# Patient Record
Sex: Female | Born: 1935 | Race: Black or African American | Hispanic: No | Smoking: Never smoker
Health system: Southern US, Community
[De-identification: ages and names within clinical notes are randomized; demographics above are authoritative.]

## PROBLEM LIST (undated history)

## (undated) DIAGNOSIS — I209 Angina pectoris, unspecified: Secondary | ICD-10-CM

## (undated) DIAGNOSIS — E119 Type 2 diabetes mellitus without complications: Secondary | ICD-10-CM

## (undated) DIAGNOSIS — M1990 Unspecified osteoarthritis, unspecified site: Secondary | ICD-10-CM

## (undated) DIAGNOSIS — I219 Acute myocardial infarction, unspecified: Secondary | ICD-10-CM

## (undated) DIAGNOSIS — R627 Adult failure to thrive: Secondary | ICD-10-CM

## (undated) DIAGNOSIS — N183 Chronic kidney disease, stage 3 unspecified: Secondary | ICD-10-CM

## (undated) DIAGNOSIS — D571 Sickle-cell disease without crisis: Secondary | ICD-10-CM

## (undated) DIAGNOSIS — H9190 Unspecified hearing loss, unspecified ear: Secondary | ICD-10-CM

## (undated) DIAGNOSIS — R609 Edema, unspecified: Secondary | ICD-10-CM

## (undated) DIAGNOSIS — Z853 Personal history of malignant neoplasm of breast: Secondary | ICD-10-CM

## (undated) DIAGNOSIS — D649 Anemia, unspecified: Secondary | ICD-10-CM

## (undated) DIAGNOSIS — R002 Palpitations: Secondary | ICD-10-CM

## (undated) DIAGNOSIS — E785 Hyperlipidemia, unspecified: Secondary | ICD-10-CM

## (undated) DIAGNOSIS — I10 Essential (primary) hypertension: Secondary | ICD-10-CM

## (undated) DIAGNOSIS — I739 Peripheral vascular disease, unspecified: Secondary | ICD-10-CM

## (undated) DIAGNOSIS — C801 Malignant (primary) neoplasm, unspecified: Secondary | ICD-10-CM

## (undated) DIAGNOSIS — I2510 Atherosclerotic heart disease of native coronary artery without angina pectoris: Secondary | ICD-10-CM

## (undated) DIAGNOSIS — I5189 Other ill-defined heart diseases: Secondary | ICD-10-CM

## (undated) DIAGNOSIS — K219 Gastro-esophageal reflux disease without esophagitis: Secondary | ICD-10-CM

## (undated) HISTORY — PX: CORONARY ARTERY BYPASS GRAFT: SHX141

## (undated) HISTORY — DX: Personal history of malignant neoplasm of breast: Z85.3

## (undated) HISTORY — DX: Chronic kidney disease, stage 3 unspecified: N18.30

## (undated) HISTORY — PX: MASTECTOMY: SHX3

## (undated) HISTORY — DX: Chronic kidney disease, stage 3 (moderate): N18.3

## (undated) HISTORY — PX: BLADDER SURGERY: SHX569

## (undated) HISTORY — DX: Hyperlipidemia, unspecified: E78.5

## (undated) HISTORY — DX: Other ill-defined heart diseases: I51.89

## (undated) HISTORY — PX: TUMOR REMOVAL: SHX12

## (undated) HISTORY — DX: Adult failure to thrive: R62.7

## (undated) HISTORY — PX: ABDOMINAL HYSTERECTOMY: SHX81

## (undated) HISTORY — PX: CORONARY ANGIOPLASTY: SHX604

## (undated) HISTORY — DX: Peripheral vascular disease, unspecified: I73.9

## (undated) HISTORY — DX: Gastro-esophageal reflux disease without esophagitis: K21.9

---
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
## 2004-11-11 ENCOUNTER — Inpatient Hospital Stay: Payer: Self-pay | Admitting: Internal Medicine

## 2004-11-11 ENCOUNTER — Other Ambulatory Visit: Payer: Self-pay

## 2004-11-12 ENCOUNTER — Other Ambulatory Visit: Payer: Self-pay

## 2004-11-13 ENCOUNTER — Other Ambulatory Visit: Payer: Self-pay

## 2004-11-14 ENCOUNTER — Other Ambulatory Visit: Payer: Self-pay

## 2007-10-15 ENCOUNTER — Other Ambulatory Visit: Payer: Self-pay

## 2007-10-15 ENCOUNTER — Emergency Department: Payer: Self-pay | Admitting: Emergency Medicine

## 2008-04-20 ENCOUNTER — Ambulatory Visit: Payer: Self-pay | Admitting: Gastroenterology

## 2008-12-01 ENCOUNTER — Ambulatory Visit: Payer: Self-pay | Admitting: General Surgery

## 2008-12-08 ENCOUNTER — Ambulatory Visit: Payer: Self-pay | Admitting: General Surgery

## 2008-12-15 ENCOUNTER — Ambulatory Visit: Payer: Self-pay | Admitting: Oncology

## 2008-12-22 ENCOUNTER — Ambulatory Visit: Payer: Self-pay | Admitting: Oncology

## 2009-01-15 ENCOUNTER — Ambulatory Visit: Payer: Self-pay | Admitting: Oncology

## 2010-07-27 ENCOUNTER — Emergency Department: Payer: Self-pay | Admitting: Unknown Physician Specialty

## 2010-08-11 ENCOUNTER — Emergency Department: Payer: Self-pay | Admitting: Emergency Medicine

## 2011-06-18 HISTORY — PX: ABOVE KNEE LEG AMPUTATION: SUR20

## 2012-02-15 ENCOUNTER — Emergency Department: Payer: Self-pay | Admitting: Emergency Medicine

## 2013-01-07 DIAGNOSIS — I240 Acute coronary thrombosis not resulting in myocardial infarction: Secondary | ICD-10-CM | POA: Insufficient documentation

## 2013-01-07 DIAGNOSIS — I259 Chronic ischemic heart disease, unspecified: Secondary | ICD-10-CM | POA: Insufficient documentation

## 2014-08-10 DIAGNOSIS — R1013 Epigastric pain: Secondary | ICD-10-CM | POA: Insufficient documentation

## 2014-10-27 ENCOUNTER — Other Ambulatory Visit: Payer: Self-pay | Admitting: Gastroenterology

## 2014-10-27 DIAGNOSIS — R634 Abnormal weight loss: Secondary | ICD-10-CM

## 2014-11-04 ENCOUNTER — Ambulatory Visit
Admission: RE | Admit: 2014-11-04 | Discharge: 2014-11-04 | Disposition: A | Discharge status: Home / Self Care | Payer: Medicare Other | Source: Ambulatory Visit | Attending: Gastroenterology | Admitting: Gastroenterology

## 2014-11-04 DIAGNOSIS — R634 Abnormal weight loss: Secondary | ICD-10-CM | POA: Insufficient documentation

## 2014-11-04 DIAGNOSIS — I700 Atherosclerosis of aorta: Secondary | ICD-10-CM | POA: Insufficient documentation

## 2014-11-04 HISTORY — DX: Essential (primary) hypertension: I10

## 2014-11-04 HISTORY — DX: Malignant (primary) neoplasm, unspecified: C80.1

## 2014-11-04 HISTORY — DX: Type 2 diabetes mellitus without complications: E11.9

## 2014-11-04 MED ORDER — IOHEXOL 300 MG/ML  SOLN
100.0000 mL | Freq: Once | INTRAMUSCULAR | Status: AC | PRN
  Administered 2014-11-04: 100 mL via INTRAVENOUS

## 2015-02-25 ENCOUNTER — Emergency Department
Admission: EM | Admit: 2015-02-25 | Discharge: 2015-02-25 | Disposition: A | Discharge status: Home / Self Care | Payer: Medicare Other | Attending: Emergency Medicine | Admitting: Emergency Medicine

## 2015-02-25 DIAGNOSIS — Z880 Allergy status to penicillin: Secondary | ICD-10-CM | POA: Insufficient documentation

## 2015-02-25 DIAGNOSIS — I10 Essential (primary) hypertension: Secondary | ICD-10-CM | POA: Diagnosis not present

## 2015-02-25 DIAGNOSIS — E119 Type 2 diabetes mellitus without complications: Secondary | ICD-10-CM | POA: Diagnosis not present

## 2015-02-25 DIAGNOSIS — K2100 Gastro-esophageal reflux disease with esophagitis, without bleeding: Secondary | ICD-10-CM

## 2015-02-25 DIAGNOSIS — M79605 Pain in left leg: Secondary | ICD-10-CM | POA: Diagnosis not present

## 2015-02-25 DIAGNOSIS — G8929 Other chronic pain: Secondary | ICD-10-CM | POA: Insufficient documentation

## 2015-02-25 DIAGNOSIS — R1013 Epigastric pain: Secondary | ICD-10-CM | POA: Diagnosis present

## 2015-02-25 LAB — CBC WITH DIFFERENTIAL/PLATELET
Basophils Absolute: 0.1 10*3/uL (ref 0–0.1)
Basophils Relative: 0 %
EOS ABS: 0.1 10*3/uL (ref 0–0.7)
EOS PCT: 0 %
HEMATOCRIT: 40.7 % (ref 35.0–47.0)
Hemoglobin: 13 g/dL (ref 12.0–16.0)
LYMPHS ABS: 1.8 10*3/uL (ref 1.0–3.6)
Lymphocytes Relative: 10 %
MCH: 28.7 pg (ref 26.0–34.0)
MCHC: 32 g/dL (ref 32.0–36.0)
MCV: 89.7 fL (ref 80.0–100.0)
MONO ABS: 1.6 10*3/uL — AB (ref 0.2–0.9)
MONOS PCT: 9 %
Neutro Abs: 14.6 10*3/uL — ABNORMAL HIGH (ref 1.4–6.5)
Neutrophils Relative %: 81 %
Platelets: 292 10*3/uL (ref 150–440)
RBC: 4.54 MIL/uL (ref 3.80–5.20)
RDW: 14.4 % (ref 11.5–14.5)
WBC: 18 10*3/uL — ABNORMAL HIGH (ref 3.6–11.0)

## 2015-02-25 LAB — COMPREHENSIVE METABOLIC PANEL
ALBUMIN: 4.1 g/dL (ref 3.5–5.0)
ALT: 14 U/L (ref 14–54)
AST: 29 U/L (ref 15–41)
Alkaline Phosphatase: 89 U/L (ref 38–126)
Anion gap: 12 (ref 5–15)
BUN: 22 mg/dL — AB (ref 6–20)
CHLORIDE: 97 mmol/L — AB (ref 101–111)
CO2: 23 mmol/L (ref 22–32)
Calcium: 9.6 mg/dL (ref 8.9–10.3)
Creatinine, Ser: 1.07 mg/dL — ABNORMAL HIGH (ref 0.44–1.00)
GFR calc non Af Amer: 46 mL/min — ABNORMAL LOW (ref >60)
GFR, EST AFRICAN AMERICAN: 54 mL/min — AB (ref >60)
Glucose, Bld: 319 mg/dL — ABNORMAL HIGH (ref 65–99)
Potassium: 4.9 mmol/L (ref 3.5–5.1)
Sodium: 132 mmol/L — ABNORMAL LOW (ref 135–145)
Total Bilirubin: 1 mg/dL (ref 0.3–1.2)
Total Protein: 8 g/dL (ref 6.5–8.1)

## 2015-02-25 LAB — LIPASE, BLOOD: LIPASE: 25 U/L (ref 22–51)

## 2015-02-25 LAB — TROPONIN I: Troponin I: 0.06 ng/mL — ABNORMAL HIGH (ref <0.031)

## 2015-02-25 MED ORDER — PANTOPRAZOLE SODIUM 40 MG PO TBEC: 40.0000 mg | DELAYED_RELEASE_TABLET | Freq: Every day | ORAL | Status: DC

## 2015-02-25 MED ORDER — PANTOPRAZOLE SODIUM 40 MG PO TBEC
40.0000 mg | DELAYED_RELEASE_TABLET | Freq: Once | ORAL | Status: AC
  Administered 2015-02-25: 40 mg via ORAL
  Filled 2015-02-25: qty 1

## 2015-02-25 MED ORDER — GI COCKTAIL ~~LOC~~
30.0000 mL | ORAL | Status: AC
  Administered 2015-02-25: 30 mL via ORAL
  Filled 2015-02-25: qty 30

## 2015-02-25 MED ORDER — FAMOTIDINE 20 MG PO TABS
40.0000 mg | ORAL_TABLET | Freq: Once | ORAL | Status: AC
  Administered 2015-02-25: 40 mg via ORAL
  Filled 2015-02-25: qty 2

## 2015-02-25 NOTE — ED Notes (Addendum)
Pt c/o burning pain in epigastric that radiates into chest with nausea, worse at night, states "I have acid reflux and nothing is helping"..states she has been taking Tums and Mylanta without relief..pt states she does not want to have any blood or ecg performed, states it is just heart burn.

## 2015-02-25 NOTE — Discharge Instructions (Signed)
Gastroesophageal Reflux Disease, Adult Gastroesophageal reflux disease (GERD) happens when acid from your stomach flows up into the esophagus. When acid comes in contact with the esophagus, the acid causes soreness (inflammation) in the esophagus. Over time, GERD may create small holes (ulcers) in the lining of the esophagus. CAUSES   Increased body weight. This puts pressure on the stomach, making acid rise from the stomach into the esophagus.  Smoking. This increases acid production in the stomach.  Drinking alcohol. This causes decreased pressure in the lower esophageal sphincter (valve or ring of muscle between the esophagus and stomach), allowing acid from the stomach into the esophagus.  Late evening meals and a full stomach. This increases pressure and acid production in the stomach.  A malformed lower esophageal sphincter. Sometimes, no cause is found. SYMPTOMS   Burning pain in the lower part of the mid-chest behind the breastbone and in the mid-stomach area. This may occur twice a week or more often.  Trouble swallowing.  Sore throat.  Dry cough.  Asthma-like symptoms including chest tightness, shortness of breath, or wheezing. DIAGNOSIS  Your caregiver may be able to diagnose GERD based on your symptoms. In some cases, X-rays and other tests may be done to check for complications or to check the condition of your stomach and esophagus. TREATMENT  Your caregiver may recommend over-the-counter or prescription medicines to help decrease acid production. Ask your caregiver before starting or adding any new medicines.  HOME CARE INSTRUCTIONS   Change the factors that you can control. Ask your caregiver for guidance concerning weight loss, quitting smoking, and alcohol consumption.  Avoid foods and drinks that make your symptoms worse, such as:  Caffeine or alcoholic drinks.  Chocolate.  Peppermint or mint flavorings.  Garlic and onions.  Spicy foods.  Citrus fruits,  such as oranges, lemons, or limes.  Tomato-based foods such as sauce, chili, salsa, and pizza.  Fried and fatty foods.  Avoid lying down for the 3 hours prior to your bedtime or prior to taking a nap.  Eat small, frequent meals instead of large meals.  Wear loose-fitting clothing. Do not wear anything tight around your waist that causes pressure on your stomach.  Raise the head of your bed 6 to 8 inches with wood blocks to help you sleep. Extra pillows will not help.  Only take over-the-counter or prescription medicines for pain, discomfort, or fever as directed by your caregiver.  Do not take aspirin, ibuprofen, or other nonsteroidal anti-inflammatory drugs (NSAIDs). SEEK IMMEDIATE MEDICAL CARE IF:   You have pain in your arms, neck, jaw, teeth, or back.  Your pain increases or changes in intensity or duration.  You develop nausea, vomiting, or sweating (diaphoresis).  You develop shortness of breath, or you faint.  Your vomit is green, yellow, black, or looks like coffee grounds or blood.  Your stool is red, bloody, or black. These symptoms could be signs of other problems, such as heart disease, gastric bleeding, or esophageal bleeding. MAKE SURE YOU:   Understand these instructions.  Will watch your condition.  Will get help right away if you are not doing well or get worse. Document Released: 03/13/2005 Document Revised: 08/26/2011 Document Reviewed: 12/21/2010 Exit[employer] Patient Information 2015 [employer]. This information is not intended to replace advice given to you by your health care provider. Make sure you discuss any questions you have with your health care provider.  

## 2015-02-25 NOTE — ED Notes (Signed)
Patient states she is ready to go home. Dr. Joni Fears aware.

## 2015-02-25 NOTE — ED Provider Notes (Addendum)
[facility] Emergency Department Provider Note  ____________________________________________  Time seen: 4:20 PM  I have reviewed the triage vital signs and the nursing notes.   HISTORY  Chief Complaint Abdominal Pain    HPI Tiffany Bailey is a 81 y.o. female who complains of burning epigastric pain that radiates into the chest with nausea for the past 2 weeks. It's worse at night and makes her throat hurt as well. She ran out of her Protonix 4 weeks ago. This feels just like heartburn and she has no other concerns. She took Tums and Mylanta without relief. She denies exertional chest pain. No shortness of breath dizziness or syncope. No dysuria frequency or urgency.     Past Medical History  Diagnosis Date  . Cancer   . Diabetes mellitus without complication   . Hypertension   . Acid reflux      There are no active problems to display for this patient.    Past Surgical History  Procedure Laterality Date  . Breast surgery    . Triple bipass    . Abdominal hysterectomy    . Mastectomy       Current Outpatient Rx  Name  Route  Sig  Dispense  Refill  . pantoprazole (PROTONIX) 40 MG tablet   Oral   Take 1 tablet (40 mg total) by mouth daily.   30 tablet   2      Allergies Amoxicillin and Gabapentin   No family history on file.  Social History Social History  Substance Use Topics  . Smoking status: Never Smoker   . Smokeless tobacco: Never Used  . Alcohol Use: No    Review of Systems  Constitutional:   No fever or chills. No weight changes Eyes:   No blurry vision or double vision.  ENT:   No sore throat. Cardiovascular:   No chest pain. Respiratory:   No dyspnea or cough. Gastrointestinal:   Epigastric abdominal pain without vomiting or diarrhea..  No BRBPR or melena. Genitourinary:   Negative for dysuria, urinary retention, bloody urine, or difficulty urinating. Musculoskeletal:   Chronic left leg pain  Skin:    Negative for rash. Neurological:   Negative for headaches, focal weakness or numbness. Psychiatric:  No anxiety or depression.   Endocrine:  No hot/cold intolerance, changes in energy, or sleep difficulty.  10-point ROS otherwise negative.  ____________________________________________   PHYSICAL EXAM:  VITAL SIGNS: ED Triage Vitals  Enc Vitals Group     BP 02/25/15 1443 168/74 mmHg     Pulse Rate 02/25/15 1443 114     Resp 02/25/15 1625 18     Temp 02/25/15 1443 98.1 F (36.7 C)     Temp Source 02/25/15 1443 Oral     SpO2 02/25/15 1443 97 %     Weight 02/25/15 1443 80 lb (36.288 kg)     Height --      Head Cir --      Peak Flow --      Pain Score 02/25/15 1443 0     Pain Loc --      Pain Edu? --      Excl. in [place]? --      Constitutional:   Alert and oriented. Well appearing and in no distress. Eyes:   No scleral icterus. No conjunctival pallor. PERRL. EOMI ENT   Head:   Normocephalic and atraumatic.   Nose:   No congestion/rhinnorhea. No septal hematoma   Mouth/Throat:   MMM, moderate  pharyngeal erythema. No peritonsillar mass. No uvula shift.   Neck:   No stridor. No SubQ emphysema. No meningismus. Hematological/Lymphatic/Immunilogical:   No cervical lymphadenopathy. Cardiovascular:   RRR. Normal and symmetric distal pulses are present in all extremities. No murmurs, rubs, or gallops. Respiratory:   Normal respiratory effort without tachypnea nor retractions. Breath sounds are clear and equal bilaterally. No wheezes/rales/rhonchi. Gastrointestinal:   Soft with tenderness over the epigastrium and left upper quadrant. There is also mild suprapubic tenderness.. No distention. There is no CVA tenderness.  No rebound, rigidity, or guarding. Genitourinary:   deferred Musculoskeletal:   Nontender with normal range of motion in all extremities. No joint effusions.  No lower extremity tenderness.  No edema. Status post left AKA Neurologic:   Normal speech and  language.  CN 2-10 normal. Motor grossly intact. No pronator drift.  Normal gait. No gross focal neurologic deficits are appreciated.  Skin:    Skin is warm, dry and intact. No rash noted.  No petechiae, purpura, or bullae. Psychiatric:   Mood and affect are normal. Speech and behavior are normal. Patient exhibits appropriate insight and judgment.  ____________________________________________    LABS (pertinent positives/negatives) (all labs ordered are listed, but only abnormal results are displayed) Labs Reviewed  COMPREHENSIVE METABOLIC PANEL - Abnormal; Notable for the following:    Sodium 132 (*)    Chloride 97 (*)    Glucose, Bld 319 (*)    BUN 22 (*)    Creatinine, Ser 1.07 (*)    GFR calc non Af Amer 46 (*)    GFR calc Af Amer 54 (*)    All other components within normal limits  TROPONIN I - Abnormal; Notable for the following:    Troponin I 0.06 (*)    All other components within normal limits  CBC WITH DIFFERENTIAL/PLATELET - Abnormal; Notable for the following:    WBC 18.0 (*)    Neutro Abs 14.6 (*)    Monocytes Absolute 1.6 (*)    All other components within normal limits  LIPASE, BLOOD   ____________________________________________   EKG  Interpreted by me Sinus tachycardia rate 114. Left axis, normal intervals. There is LVH with diffuse repolarization abnormality with ST depressions and T-wave inversions.  This is unchanged from the description in the outpatient cardiology note of an EKG performed on 09/09/2013. ____________________________________________    RADIOLOGY    ____________________________________________   PROCEDURES   ____________________________________________   INITIAL IMPRESSION / ASSESSMENT AND PLAN / ED COURSE  Pertinent labs & imaging results that were available during my care of the patient were reviewed by me and considered in my medical decision making (see chart for details).  Patient presents with symptoms and  examination and history that are entirely consistent with GERD and gastritis due to rebound effect from discontinuation of Protonix. Low suspicion for ACS PE TAD pneumothorax or carditis pneumonia or sepsis. No Suspicion for biliary pathology pancreatitis or AAA. She had a recent CT of the abdomen that was negative for AAA.  We'll check labs and EKG and reassess. I'll give him an antacid as well.  ----------------------------------------- 5:23 PM on 02/25/2015 -----------------------------------------  EKG at baseline. Labs pending. Patient feels better after antacids and wishes to go home immediately. I'll follow-up on labs and call the patient back if there are any significant findings. She is well-appearing no acute distress, nontoxic.  ----------------------------------------- 6:12 PM on 02/25/2015 -----------------------------------------  Patient did stay in the ER to wait for lab results. They reveal a white  blood cell count of 18 which is nonspecific. Lipase is normal, chemistry is unremarkable. Troponin is 0.06. I was unable to find any previous results in our system or in Epic care everywhere to compare to for the troponin, but with her underlying heart disease I suspect that this is a chronic baseline especially given the nature of her symptoms today. She reports that after antacids she feels much better and back to normal. I discussed these results with her and the patient wants to go home and I think that is reasonable. She does report that she is compliant with her medications including Plavix. I encouraged her to follow up with her cardiologist this week for continued monitoring of her symptoms. I wrote her a refill prescription for her Protonix. At this time I think that the EKG findings and troponin are at her chronic baseline and based on resolution of symptoms with antacids and the nature of her symptoms and the strong history of pointing to GERD and no evidence of exertional  symptoms, I think is unlikely that she is having ACS. I would also anticipate that with 2 weeks of symptoms, if the troponin were related to ACS, it would be much higher.  ____________________________________________   FINAL CLINICAL IMPRESSION(S) / ED DIAGNOSES  Final diagnoses:  Gastroesophageal reflux disease with esophagitis      Carrie Mew, MD 02/25/15 1723  Carrie Mew, MD 02/25/15 [place], MD 02/25/15 1815

## 2015-09-08 DIAGNOSIS — Z9119 Patient's noncompliance with other medical treatment and regimen: Secondary | ICD-10-CM | POA: Insufficient documentation

## 2015-09-08 DIAGNOSIS — Z91199 Patient's noncompliance with other medical treatment and regimen due to unspecified reason: Secondary | ICD-10-CM | POA: Insufficient documentation

## 2015-11-14 ENCOUNTER — Encounter: Payer: Self-pay | Admitting: *Deleted

## 2015-11-16 ENCOUNTER — Ambulatory Visit: Payer: Medicare Other | Admitting: Registered Nurse

## 2015-11-16 ENCOUNTER — Encounter: Payer: Self-pay | Admitting: *Deleted

## 2015-11-16 ENCOUNTER — Ambulatory Visit
Admission: RE | Admit: 2015-11-16 | Discharge: 2015-11-16 | Disposition: A | Discharge status: Home / Self Care | Payer: Medicare Other | Source: Ambulatory Visit | Attending: Ophthalmology | Admitting: Ophthalmology

## 2015-11-16 ENCOUNTER — Encounter
Admission: RE | Disposition: A | Discharge status: Home / Self Care | Payer: Self-pay | Source: Ambulatory Visit | Attending: Ophthalmology

## 2015-11-16 DIAGNOSIS — H2512 Age-related nuclear cataract, left eye: Secondary | ICD-10-CM | POA: Insufficient documentation

## 2015-11-16 DIAGNOSIS — H4089 Other specified glaucoma: Secondary | ICD-10-CM | POA: Insufficient documentation

## 2015-11-16 DIAGNOSIS — H348122 Central retinal vein occlusion, left eye, stable: Secondary | ICD-10-CM | POA: Diagnosis not present

## 2015-11-16 DIAGNOSIS — I10 Essential (primary) hypertension: Secondary | ICD-10-CM | POA: Insufficient documentation

## 2015-11-16 DIAGNOSIS — E119 Type 2 diabetes mellitus without complications: Secondary | ICD-10-CM | POA: Insufficient documentation

## 2015-11-16 DIAGNOSIS — I252 Old myocardial infarction: Secondary | ICD-10-CM | POA: Insufficient documentation

## 2015-11-16 DIAGNOSIS — I2510 Atherosclerotic heart disease of native coronary artery without angina pectoris: Secondary | ICD-10-CM | POA: Insufficient documentation

## 2015-11-16 DIAGNOSIS — K219 Gastro-esophageal reflux disease without esophagitis: Secondary | ICD-10-CM | POA: Diagnosis not present

## 2015-11-16 HISTORY — DX: Atherosclerotic heart disease of native coronary artery without angina pectoris: I25.10

## 2015-11-16 HISTORY — DX: Edema, unspecified: R60.9

## 2015-11-16 HISTORY — DX: Unspecified osteoarthritis, unspecified site: M19.90

## 2015-11-16 HISTORY — DX: Anemia, unspecified: D64.9

## 2015-11-16 HISTORY — PX: ANTERIOR VITRECTOMY: SHX1173

## 2015-11-16 HISTORY — DX: Palpitations: R00.2

## 2015-11-16 HISTORY — PX: CATARACT EXTRACTION W/PHACO: SHX586

## 2015-11-16 HISTORY — DX: Angina pectoris, unspecified: I20.9

## 2015-11-16 HISTORY — DX: Unspecified hearing loss, unspecified ear: H91.90

## 2015-11-16 HISTORY — DX: Acute myocardial infarction, unspecified: I21.9

## 2015-11-16 LAB — GLUCOSE, CAPILLARY: GLUCOSE-CAPILLARY: 118 mg/dL — AB (ref 65–99)

## 2015-11-16 SURGERY — PHACOEMULSIFICATION, CATARACT, WITH IOL INSERTION
Anesthesia: Monitor Anesthesia Care | Site: Eye | Laterality: Left | Wound class: Clean

## 2015-11-16 MED ORDER — SODIUM CHLORIDE 0.9 % IV SOLN
INTRAVENOUS | Status: DC
  Administered 2015-11-16: 09:00:00 via INTRAVENOUS

## 2015-11-16 MED ORDER — NA CHONDROIT SULF-NA HYALURON 40-30 MG/ML IO SOLN
INTRAOCULAR | Status: DC | PRN
  Administered 2015-11-16: 0.5 mL via INTRAOCULAR

## 2015-11-16 MED ORDER — NA HYALUR & NA CHOND-NA HYALUR 0.55-0.5 ML IO KIT
PACK | INTRAOCULAR | Status: DC | PRN
  Administered 2015-11-16: 1 via OPHTHALMIC

## 2015-11-16 MED ORDER — ARMC OPHTHALMIC DILATING GEL
OPHTHALMIC | Status: AC
  Administered 2015-11-16: 1 via OPHTHALMIC
  Filled 2015-11-16: qty 0.25

## 2015-11-16 MED ORDER — ONDANSETRON HCL 4 MG/2ML IJ SOLN
INTRAMUSCULAR | Status: DC | PRN
Start: 2015-11-16 — End: 2015-11-16
  Administered 2015-11-16: 4 mg via INTRAVENOUS

## 2015-11-16 MED ORDER — NEOMYCIN-POLYMYXIN-DEXAMETH 3.5-10000-0.1 OP OINT
TOPICAL_OINTMENT | OPHTHALMIC | Status: AC
  Filled 2015-11-16: qty 3.5

## 2015-11-16 MED ORDER — CARBACHOL 0.01 % IO SOLN
INTRAOCULAR | Status: DC | PRN
  Administered 2015-11-16: 0.5 mL via INTRAOCULAR

## 2015-11-16 MED ORDER — TETRACAINE HCL 0.5 % OP SOLN
OPHTHALMIC | Status: AC
  Administered 2015-11-16: 1 [drp] via OPHTHALMIC
  Filled 2015-11-16: qty 2

## 2015-11-16 MED ORDER — NA CHONDROIT SULF-NA HYALURON 40-30 MG/ML IO SOLN
INTRAOCULAR | Status: AC
  Filled 2015-11-16: qty 0.5

## 2015-11-16 MED ORDER — LIDOCAINE HCL (PF) 4 % IJ SOLN
INTRAMUSCULAR | Status: DC | PRN
  Administered 2015-11-16: 4 mL via OPHTHALMIC

## 2015-11-16 MED ORDER — FENTANYL CITRATE (PF) 100 MCG/2ML IJ SOLN
INTRAMUSCULAR | Status: DC | PRN
  Administered 2015-11-16 (×2): 50 ug via INTRAVENOUS

## 2015-11-16 MED ORDER — POVIDONE-IODINE 5 % OP SOLN
1.0000 "application " | Freq: Once | OPHTHALMIC | Status: AC
  Administered 2015-11-16: 1 via OPHTHALMIC

## 2015-11-16 MED ORDER — TETRACAINE HCL 0.5 % OP SOLN
1.0000 [drp] | Freq: Once | OPHTHALMIC | Status: AC
  Administered 2015-11-16: 1 [drp] via OPHTHALMIC

## 2015-11-16 MED ORDER — CEFUROXIME OPHTHALMIC INJECTION 1 MG/0.1 ML
INJECTION | OPHTHALMIC | Status: DC | PRN
  Administered 2015-11-16: 1 mg via INTRACAMERAL

## 2015-11-16 MED ORDER — MOXIFLOXACIN HCL 0.5 % OP SOLN
OPHTHALMIC | Status: AC
  Filled 2015-11-16: qty 3

## 2015-11-16 MED ORDER — TRIAMCINOLONE ACETONIDE 40 MG/ML IJ SUSP
INTRAMUSCULAR | Status: AC
  Filled 2015-11-16: qty 1

## 2015-11-16 MED ORDER — MIDAZOLAM HCL 2 MG/2ML IJ SOLN
INTRAMUSCULAR | Status: DC | PRN
  Administered 2015-11-16: 1 mg via INTRAVENOUS

## 2015-11-16 MED ORDER — TRIAMCINOLONE ACETONIDE 40 MG/ML IJ SUSP
INTRAMUSCULAR | Status: DC | PRN
  Administered 2015-11-16: 4 mL

## 2015-11-16 MED ORDER — EPINEPHRINE HCL 1 MG/ML IJ SOLN
INTRAOCULAR | Status: DC | PRN
  Administered 2015-11-16: 250 mL via OPHTHALMIC

## 2015-11-16 MED ORDER — MOXIFLOXACIN HCL 0.5 % OP SOLN: 1.0000 [drp] | OPHTHALMIC | Status: DC | PRN

## 2015-11-16 MED ORDER — MOXIFLOXACIN HCL 0.5 % OP SOLN
OPHTHALMIC | Status: DC | PRN
  Administered 2015-11-16: 1 [drp] via OPHTHALMIC

## 2015-11-16 MED ORDER — ARMC OPHTHALMIC DILATING GEL
1.0000 "application " | OPHTHALMIC | Status: AC | PRN
  Administered 2015-11-16 (×2): 1 via OPHTHALMIC

## 2015-11-16 MED ORDER — POLYMYXIN B-TRIMETHOPRIM 10000-0.1 UNIT/ML-% OP SOLN
OPHTHALMIC | Status: DC | PRN
  Administered 2015-11-16: 1 [drp] via OPHTHALMIC

## 2015-11-16 MED ORDER — POVIDONE-IODINE 5 % OP SOLN
OPHTHALMIC | Status: AC
  Administered 2015-11-16: 1 via OPHTHALMIC
  Filled 2015-11-16: qty 30

## 2015-11-16 SURGICAL SUPPLY — 29 items
CANNULA ANT/CHMB 27GA (MISCELLANEOUS) ×2 IMPLANT
CARTRIDGE IOL B-STYLE MONARCH (MISCELLANEOUS) ×2 IMPLANT
CUP MEDICINE 2OZ PLAST GRAD ST (MISCELLANEOUS) ×2 IMPLANT
GLOVE BIO SURGEON STRL SZ8 (GLOVE) ×2 IMPLANT
GLOVE BIOGEL M 6.5 STRL (GLOVE) ×2 IMPLANT
GLOVE SURG LX 7.5 STRW (GLOVE) ×1
GLOVE SURG LX STRL 7.5 STRW (GLOVE) ×1 IMPLANT
GOWN STRL REUS W/ TWL LRG LVL3 (GOWN DISPOSABLE) ×2 IMPLANT
GOWN STRL REUS W/TWL LRG LVL3 (GOWN DISPOSABLE) ×2
LENS IOL ACRSF IQ PC 27.5 (Intraocular Lens) IMPLANT
LENS IOL ACRSF MP 26.5 (Intraocular Lens) ×1 IMPLANT
LENS IOL ACRYSOF IQ POST 27.5 (Intraocular Lens) IMPLANT
LENS IOL ACRYSOF POST 26.5 (Intraocular Lens) ×2 IMPLANT
NDL SAFETY ECLIPSE 18X1.5 (NEEDLE) ×1 IMPLANT
NEEDLE HYPO 18GX1.5 SHARP (NEEDLE) ×1
PACK CATARACT (MISCELLANEOUS) ×2 IMPLANT
PACK CATARACT BRASINGTON LX (MISCELLANEOUS) ×2 IMPLANT
PACK EYE AFTER SURG (MISCELLANEOUS) ×2 IMPLANT
PACK VIT ANT 23G (MISCELLANEOUS) ×2 IMPLANT
SOL BSS BAG (MISCELLANEOUS) ×2
SOL PREP PVP 2OZ (MISCELLANEOUS) ×2
SOLUTION BSS BAG (MISCELLANEOUS) ×1 IMPLANT
SOLUTION PREP PVP 2OZ (MISCELLANEOUS) ×1 IMPLANT
SUT ETHILON 10 0 CS140 6 (SUTURE) ×2 IMPLANT
SYR 3ML LL SCALE MARK (SYRINGE) ×4 IMPLANT
SYR 5ML LL (SYRINGE) ×4 IMPLANT
SYR TB 1ML 27GX1/2 LL (SYRINGE) ×2 IMPLANT
WATER STERILE IRR 1000ML POUR (IV SOLUTION) ×2 IMPLANT
WIPE NON LINTING 3.25X3.25 (MISCELLANEOUS) ×2 IMPLANT

## 2015-11-16 NOTE — Anesthesia Procedure Notes (Signed)
Procedure Name: MAC Date/Time: 11/16/2015 9:51 AM Performed by: Doreen Salvage Pre-anesthesia Checklist: Patient identified, Emergency Drugs available, Suction available and Patient being monitored Patient Re-evaluated:Patient Re-evaluated prior to inductionOxygen Delivery Method: Nasal cannula

## 2015-11-16 NOTE — Discharge Instructions (Signed)
Eye Surgery Discharge Instructions  Expect mild scratchy sensation or mild soreness. DO NOT RUB YOUR EYE!  The day of surgery:  Minimal physical activity, but bed rest is not required  No reading, computer work, or close hand work  No bending, lifting, or straining.  May watch TV  For 24 hours:  No driving, legal decisions, or alcoholic beverages  Safety precautions  Eat anything you prefer: It is better to start with liquids, then soup then solid foods.  _____ Eye patch should be worn until postoperative exam tomorrow.  ____ Solar shield eyeglasses should be worn for comfort in the sunlight/patch while sleeping  Resume all regular medications including aspirin or Coumadin if these were discontinued prior to surgery. You may shower, bathe, shave, or wash your hair. Tylenol may be taken for mild discomfort.  Call your doctor if you experience significant pain, nausea, or vomiting, fever > 101 or other signs of infection. [phone] or [phone] Specific instructions:  Follow-up Information    Follow up with Benay Pillow, MD In 1 day.   Specialty:  Ophthalmology   Why:  June 2 at 8:45am   Contact information:   [address] [place] [address] [phone]

## 2015-11-16 NOTE — Anesthesia Preprocedure Evaluation (Signed)
Anesthesia Evaluation  Patient identified by MRN, date of birth, ID band Patient awake    Reviewed: Allergy & Precautions, NPO status , Patient's Chart, lab work & pertinent test results  Airway Mallampati: II       Dental  (+) Teeth Intact   Pulmonary neg pulmonary ROS,    breath sounds clear to auscultation       Cardiovascular Exercise Tolerance: Poor hypertension, Pt. on home beta blockers + angina with exertion + CAD and + Past MI   Rhythm:Regular Rate:Normal     Neuro/Psych negative neurological ROS     GI/Hepatic GERD  Medicated,  Endo/Other  diabetes  Renal/GU      Musculoskeletal   Abdominal Normal abdominal exam  (+)   Peds  Hematology   Anesthesia Other Findings   Reproductive/Obstetrics                             Anesthesia Physical Anesthesia Plan  ASA: III  Anesthesia Plan: MAC   Post-op Pain Management:    Induction: Intravenous  Airway Management Planned: Natural Airway and Nasal Cannula  Additional Equipment:   Intra-op Plan:   Post-operative Plan:   Informed Consent: I have reviewed the patients History and Physical, chart, labs and discussed the procedure including the risks, benefits and alternatives for the proposed anesthesia with the patient or authorized representative who has indicated his/her understanding and acceptance.     Plan Discussed with: CRNA  Anesthesia Plan Comments:         Anesthesia Quick Evaluation

## 2015-11-16 NOTE — Anesthesia Postprocedure Evaluation (Signed)
Anesthesia Post Note  Patient: Tiffany Bailey  Procedure(s) Performed: Procedure(s) (LRB): CATARACT EXTRACTION PHACO AND INTRAOCULAR LENS PLACEMENT (IOC) (Left) ANTERIOR VITRECTOMY (Left)  Patient location during evaluation: Other Anesthesia Type: MAC Level of consciousness: awake and alert Pain management: pain level controlled Vital Signs Assessment: post-procedure vital signs reviewed and stable Respiratory status: spontaneous breathing, nonlabored ventilation, respiratory function stable and patient connected to nasal cannula oxygen Cardiovascular status: stable and blood pressure returned to baseline Anesthetic complications: no    Last Vitals:  Filed Vitals:   11/16/15 0847 11/16/15 1113  BP: 220/74 199/70  Pulse: 54 69  Temp: 35.6 C 36.8 C  Resp: 16 12    Last Pain: There were no vitals filed for this visit.               Doreen Salvage A

## 2015-11-16 NOTE — Op Note (Signed)
OPERATIVE NOTE  MAEVRY PEDDLE UK:[id number] 11/16/2015   PREOPERATIVE DIAGNOSIS:  Nuclear sclerotic cataract left eye.  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   POSTOPERATIVE DIAGNOSIS:    Nuclear sclerotic cataract left eye.     PROCEDURE:   1.  Phacoemusification with posterior chamber intraocular lens placement of the left eye  CPT [phone] 2  Anterior vitrectomy CPT 67010   LENS:   Implant Name Type Inv. Item Serial No. Manufacturer Lot No. LRB No. Used  LENS IOL POST 26.5 - [id number] Intraocular Lens LENS IOL POST 26.5 [id number]   Left 1       [id number] 26.5 3 piece lens placed in the sulcus.   ULTRASOUND TIME: 1 minutes 22 seconds.  CDE 10.46   SURGEON:  Benay Pillow, MD, MPH   ANESTHESIA:  Topical with tetracaine drops and 2% Xylocaine jelly, augmented with 1% preservative-free intracameral lidocaine.   COMPLICATIONS:  1.  Anterior capsular tear and posterior capsular rupture with vitreous loss, likely a wrap around tear.  No known lens fragments lost to the posterior segment.   DESCRIPTION OF PROCEDURE:  The patient was identified in the holding room and transported to the operating room and placed in the supine position under the operating microscope.  The left eye was identified as the operative eye and it was prepped and draped in the usual sterile ophthalmic fashion.   A 1.0 millimeter clear-corneal paracentesis was made at the 5:00 position. 0.5 ml of preservative-free 1% lidocaine with epinephrine was injected into the anterior chamber.  The anterior chamber was filled with Viscoat viscoelastic.  The chamber was relatively shallow, consistent with the short axial length.    A 2.4 millimeter keratome was used to make a near-clear corneal incision at the 2:00 position.  A curvilinear capsulorrhexis was made with a cystotome and capsulorrhexis forceps.  Balanced salt solution was used to hydrodissect and hydrodelineate the  nucleus.   Phacoemulsification was then used in stop and chop fashion to remove the lens nucleus and epinucleus.  The cortex was then removed using the irrigation and aspiration handpiece.  During the cortex removal, a posterior capsular tear was noted temporally, subincisional.  It is unclear when this occurred, but it is likely that an anterior capsular tear was created during the phacoemulsification which wrapped around.  No lens fragments were lost at any time.  The anterior and posterior capsule were compromised from 7:30 - 10:00.  Viscoat was injected in the area of the tear and an anterior vitrectomy was performed.  The remaining cortex was removed under the I/A cut setting.  Additional viscoat was injected into the eye.  The temporal incsion was enlarged to about 2.7 mm with the 2.4 mm blade.    A 3 piece lens was inserted with the leading haptic placed into the sulcus.  Upon removal of the injector, the lens tilted sideways.  The trailing haptic outside of the eye was stabilized with mcpherson forceps and the lens was yawed back into position with the sinsky hook.  The kellman mcpherson forceps were used to place the trailing haptic into the fornix.  There was some difficulty in positioning the lens.  Superiorly the lens fell behind the anterior capsule and additional manipulations were carried out twice to bring it back anterior to the anterior capsule.  Kenalog was injected into the eye and the remaining vitreous was removed from the anterior chamber.   A 10-0 nylon suture was placed through  the temporal wound.   Wounds were hydrated with balanced salt solution.  The anterior chamber was inflated to a physiologic pressure with balanced salt solution. No wound leaks were noted.    The lens was well centered without tilt.  Miostat was injected.  The pupil was round.   Intracameral cefazolin was injected.    Topical Vigamox drops were applied to the eye.  Maxitrol ointment was placed on the  eye, as well as a patch and shield.  The patient was taken to the recovery room in stable condition.   The difficulty of the case was disclosed to the patient and present family member.  The overall visual potential is guarded due to the hemiretinal vein occlusion and phacomorphic glaucoma, as well as the complexity of this surgery.  Benay Pillow 11/16/2015, 11:18 AM

## 2015-11-16 NOTE — H&P (Signed)
  The History and Physical notes are on paper, have been signed, and are to be scanned. The patient remains stable and unchanged from the H&P.   Previous H&P reviewed, patient examined, and there are no changes.  Benay Pillow 11/16/2015 9:48 AM

## 2015-11-16 NOTE — Transfer of Care (Signed)
Immediate Anesthesia Transfer of Care Note  Patient: Tiffany Bailey  Procedure(s) Performed: Procedure(s) with comments: CATARACT EXTRACTION PHACO AND INTRAOCULAR LENS PLACEMENT (IOC) (Left) - Lot # [id number] H [place]; 01:22.8 AP%:12.6 CDE: 10.46 ANTERIOR VITRECTOMY (Left)  Patient Location: PHASE II  Anesthesia Type:MAC  Level of Consciousness: Awake, Alert, Oriented  Airway & Oxygen Therapy: Patient Spontanous Breathing and Patient on room air   Post-op Assessment: Report given to RN and Post -op Vital signs reviewed and stable  Post vital signs: Reviewed and stable  Last Vitals:  Filed Vitals:   11/16/15 0847 11/16/15 1113  BP: 220/74 199/70  Pulse: 54 69  Temp: 35.6 C 36.8 C  Resp: 16 12    Complications: No apparent anesthesia complications

## 2016-01-15 DIAGNOSIS — I160 Hypertensive urgency: Secondary | ICD-10-CM | POA: Insufficient documentation

## 2016-01-28 DIAGNOSIS — N189 Chronic kidney disease, unspecified: Secondary | ICD-10-CM | POA: Insufficient documentation

## 2016-01-28 DIAGNOSIS — R339 Retention of urine, unspecified: Secondary | ICD-10-CM | POA: Insufficient documentation

## 2016-01-28 DIAGNOSIS — N179 Acute kidney failure, unspecified: Secondary | ICD-10-CM | POA: Insufficient documentation

## 2016-01-28 DIAGNOSIS — E875 Hyperkalemia: Secondary | ICD-10-CM | POA: Insufficient documentation

## 2016-01-28 DIAGNOSIS — E871 Hypo-osmolality and hyponatremia: Secondary | ICD-10-CM | POA: Insufficient documentation

## 2016-03-06 ENCOUNTER — Ambulatory Visit: Payer: Medicare Other | Admitting: Family Medicine

## 2016-03-25 ENCOUNTER — Ambulatory Visit (INDEPENDENT_AMBULATORY_CARE_PROVIDER_SITE_OTHER): Payer: Medicare Other | Admitting: Family Medicine

## 2016-03-25 ENCOUNTER — Encounter: Payer: Self-pay | Admitting: Family Medicine

## 2016-03-25 VITALS — BP 156/88 | HR 70 | Temp 98.2°F | Ht 60.0 in | Wt 79.2 lb

## 2016-03-25 DIAGNOSIS — D649 Anemia, unspecified: Secondary | ICD-10-CM

## 2016-03-25 DIAGNOSIS — Z23 Encounter for immunization: Secondary | ICD-10-CM | POA: Diagnosis not present

## 2016-03-25 DIAGNOSIS — G44229 Chronic tension-type headache, not intractable: Secondary | ICD-10-CM

## 2016-03-25 DIAGNOSIS — I10 Essential (primary) hypertension: Secondary | ICD-10-CM

## 2016-03-25 DIAGNOSIS — E119 Type 2 diabetes mellitus without complications: Secondary | ICD-10-CM | POA: Insufficient documentation

## 2016-03-25 DIAGNOSIS — E1165 Type 2 diabetes mellitus with hyperglycemia: Secondary | ICD-10-CM | POA: Diagnosis not present

## 2016-03-25 DIAGNOSIS — IMO0002 Reserved for concepts with insufficient information to code with codable children: Secondary | ICD-10-CM

## 2016-03-25 DIAGNOSIS — E1151 Type 2 diabetes mellitus with diabetic peripheral angiopathy without gangrene: Secondary | ICD-10-CM | POA: Diagnosis not present

## 2016-03-25 DIAGNOSIS — E785 Hyperlipidemia, unspecified: Secondary | ICD-10-CM | POA: Insufficient documentation

## 2016-03-25 DIAGNOSIS — I2510 Atherosclerotic heart disease of native coronary artery without angina pectoris: Secondary | ICD-10-CM

## 2016-03-25 DIAGNOSIS — K219 Gastro-esophageal reflux disease without esophagitis: Secondary | ICD-10-CM

## 2016-03-25 DIAGNOSIS — Z853 Personal history of malignant neoplasm of breast: Secondary | ICD-10-CM

## 2016-03-25 LAB — HEMOGLOBIN A1C: HEMOGLOBIN A1C: 9.7 % — AB (ref 4.6–6.5)

## 2016-03-25 LAB — CBC
HCT: 28.6 % — ABNORMAL LOW (ref 36.0–46.0)
Hemoglobin: 9.6 g/dL — ABNORMAL LOW (ref 12.0–15.0)
MCHC: 33.4 g/dL (ref 30.0–36.0)
MCV: 89.5 fl (ref 78.0–100.0)
PLATELETS: 280 10*3/uL (ref 150.0–400.0)
RBC: 3.2 Mil/uL — AB (ref 3.87–5.11)
RDW: 15.8 % — ABNORMAL HIGH (ref 11.5–15.5)
WBC: 7.6 10*3/uL (ref 4.0–10.5)

## 2016-03-25 LAB — SEDIMENTATION RATE: SED RATE: 15 mm/h (ref 0–30)

## 2016-03-25 MED ORDER — CARVEDILOL 12.5 MG PO TABS
12.5000 mg | ORAL_TABLET | Freq: Two times a day (BID) | ORAL | 3 refills | Status: DC
Start: 2016-03-25 — End: 2017-09-01

## 2016-03-25 MED ORDER — TIZANIDINE HCL 2 MG PO CAPS: 2.0000 mg | ORAL_CAPSULE | Freq: Every evening | ORAL | Status: DC | PRN

## 2016-03-25 MED ORDER — ATORVASTATIN CALCIUM 40 MG PO TABS: 40.0000 mg | ORAL_TABLET | Freq: Every day | ORAL | 3 refills | Status: DC

## 2016-03-25 MED ORDER — AMLODIPINE BESYLATE 10 MG PO TABS: 10.0000 mg | ORAL_TABLET | Freq: Every day | ORAL | 3 refills | Status: DC

## 2016-03-25 MED ORDER — METFORMIN HCL 1000 MG PO TABS: 1000.0000 mg | ORAL_TABLET | Freq: Every day | ORAL | 1 refills | Status: DC

## 2016-03-25 NOTE — Progress Notes (Signed)
Subjective:  Patient ID: Tiffany Bailey, female    DOB: [birth year]  Age: 82 y.o. MRN: [id number]  CC: Establish care  HPI Tiffany Bailey is a 82 y.o. female with a complicated PMH, DM-2 with complications, PVD, HTN, HLD, GERD, CAD s/p CABG/multiple PCI, Hx of breast cancer, CKD III, Anemia presents to establish care.  She has a complaint of headache.  DM-2  Uncontrolled. No checking sugars regularly.  Hypoglycemia - No.  Medications - Currently taking Metformin 1000 mg daily.  Adverse effects - None.  Compliance - Yes but she is currently out of Metformin.  HTN  Uncontrolled. Secondary to noncompliance.  EMR/Care Everywhere reviewed.  Per Cardiology patient is supposed to be taking carvedilol 12.5 mg twice a day and amlodipine 10 mg daily.   Lisinopril was previously stopped secondary to AKI & hyperkalemia. Enalapril was tried and the same result occurred. She is now off ace inhibitors.   Her amlodipine is in her bag of medications today. The Coreg is not there. Will discuss today.   Hyperlipidemia  Not at goal. Most recent LDL was 117.  She is reportedly on Lipitor 40 mg daily. She does not have this medication in her bag today.   Unsure of compliance.  CAD  Stable.  No current chest pain.  Followed by cardiology. On Aspirin, Plavix, Statin (unsure if she is taking), Coreg (unsure if she's taking).  Headache  Per neurology notes, this is tension-type headache.  I'm unsure what she is taking.  It looks like she has been on opiates as well as fiorinal and zanaflex.  PMH, Surgical Hx, Family Hx, Social History reviewed and updated as below.  Past Medical History:  Diagnosis Date  . Anemia   . Arthritis   . CKD (chronic kidney disease), stage III   . Coronary artery disease    S/P CABG and multiple PCI's  . Failure to thrive in adult   . GERD (gastroesophageal reflux disease)   . History of breast cancer   . Hyperlipidemia   . Hypertension    Past  Surgical History:  Procedure Laterality Date  . ABDOMINAL HYSTERECTOMY    . ABOVE KNEE LEG AMPUTATION Left 2013  . ANTERIOR VITRECTOMY Left 11/16/2015   Procedure: ANTERIOR VITRECTOMY;  Surgeon: Eulogio Bear, MD;  Location: [facility] ORS;  Service: Ophthalmology;  Laterality: Left;  . BLADDER SURGERY    . CATARACT EXTRACTION W/PHACO Left 11/16/2015   Procedure: CATARACT EXTRACTION PHACO AND INTRAOCULAR LENS PLACEMENT (IOC);  Surgeon: Eulogio Bear, MD;  Location: [facility] ORS;  Service: Ophthalmology;  Laterality: Left;  Lot # [id number] H [place]; 01:22.8 AP%:12.6 CDE: 10.46  . CORONARY ANGIOPLASTY     STENT  . CORONARY ARTERY BYPASS GRAFT    . MASTECTOMY    . TUMOR REMOVAL     ABDOMINAL   Family History  Problem Relation Age of Onset  . Sudden death Mother   . Arthritis Father   . Stroke Father   . Breast cancer Sister   . Diabetes Grandchild    Social History  Substance Use Topics  . Smoking status: Never Smoker  . Smokeless tobacco: Never Used  . Alcohol use No   Review of Systems  Constitutional: Positive for unexpected weight change.  HENT: Positive for hearing loss and tinnitus.   Eyes: Positive for visual disturbance.  Gastrointestinal: Positive for abdominal pain.  Endocrine: Positive for polydipsia and polyuria.  Genitourinary: Positive for frequency.  Musculoskeletal: Positive for myalgias.  All other  systems reviewed and are negative.  Objective:   Today's Vitals: BP (!) 156/88 (BP Location: Right Arm, Patient Position: Sitting, Cuff Size: Small)   Pulse 70   Temp 98.2 F (36.8 C) (Oral)   Ht 5' (1.524 m)   Wt 79 lb 4 oz (35.9 kg)   SpO2 99%   BMI 15.48 kg/m   Physical Exam  Constitutional:  Frail elderly female sitting in wheelchair. Status post BKA (left).  HENT:  Head: Normocephalic and atraumatic.  Mouth/Throat: Oropharynx is clear and moist.  Eyes: Conjunctivae are normal.  Neck: Neck supple.  Cardiovascular: Normal rate and regular rhythm.     Pulmonary/Chest: Effort normal. She has no wheezes. She has no rales.  Abdominal: Soft. She exhibits no distension. There is no tenderness.  Neurological: She is alert.  Skin: Skin is warm and dry. No rash noted.  Psychiatric: She has a normal mood and affect.  Vitals reviewed.  Assessment & Plan:   Problem List Items Addressed This Visit    Anemia   Relevant Orders   CBC   CAD (coronary artery disease)    Stable. Patient is to continue dual antiplatelet therapy and take her blood pressure medication as prescribed. Continue Lipitor.      Relevant Medications   carvedilol (COREG) 12.5 MG tablet   atorvastatin (LIPITOR) 40 MG tablet   amLODipine (NORVASC) 10 MG tablet   Chronic tension-type headache, not intractable    Unsure of what patient is taking for this. Neurology told the patient to stop the fiorinal and use Zanaflex (she does not appear to be doing this). I advised the patient to follow up with neurology.      Relevant Medications   carvedilol (COREG) 12.5 MG tablet   tizanidine (ZANAFLEX) 2 MG capsule   amLODipine (NORVASC) 10 MG tablet   Other Relevant Orders   Sedimentation rate   Essential hypertension    Uncontrolled. Unsure of meds. After extensive review of the electronic medical record, I am placing the patient on Coreg 12.5 mg twice daily and amlodipine 10 mg daily. No ACEI at this time due to prior AKI and Hyperkalemia (this is happened on 2 separate occasions with 2 different ACE inhibitors). F/U in 2 weeks.      Relevant Medications   carvedilol (COREG) 12.5 MG tablet   atorvastatin (LIPITOR) 40 MG tablet   amLODipine (NORVASC) 10 MG tablet   GERD (gastroesophageal reflux disease)   History of breast cancer   Hyperlipidemia    Uncontrolled. I suspect this if from noncompliance. Continue Lipitor 40 mg daily.       Relevant Medications   carvedilol (COREG) 12.5 MG tablet   atorvastatin (LIPITOR) 40 MG tablet   amLODipine (NORVASC) 10 MG  tablet   Uncontrolled type 2 DM with peripheral circulatory disorder (HCC)    Uncontrolled. A1C today. Metformin refilled (okay given current renal function). Likely needs insulin.      Relevant Medications   metFORMIN (GLUCOPHAGE) 1000 MG tablet   carvedilol (COREG) 12.5 MG tablet   atorvastatin (LIPITOR) 40 MG tablet   amLODipine (NORVASC) 10 MG tablet   Other Relevant Orders   Hemoglobin A1c    Other Visit Diagnoses    Encounter for immunization       Relevant Medications   metFORMIN (GLUCOPHAGE) 1000 MG tablet   carvedilol (COREG) 12.5 MG tablet   atorvastatin (LIPITOR) 40 MG tablet   Other Relevant Orders   Flu vaccine HIGH DOSE PF (Completed)   CBC  Hemoglobin A1c      Outpatient Encounter Prescriptions as of 03/25/2016  Medication Sig  . aspirin 81 MG tablet Take 81 mg by mouth daily.  . brimonidine-timolol (COMBIGAN) 0.2-0.5 % ophthalmic solution Place 1 drop into both eyes every 12 (twelve) hours.  . clopidogrel (PLAVIX) 75 MG tablet Take 75 mg by mouth daily.  . fluticasone (FLONASE) 50 MCG/ACT nasal spray Place into both nostrils daily.  . mirtazapine (REMERON) 15 MG tablet Take 15 mg by mouth at bedtime.  . pantoprazole (PROTONIX) 40 MG tablet Take 1 tablet (40 mg total) by mouth daily.  . [DISCONTINUED] metFORMIN (GLUCOPHAGE) 500 MG tablet Take 500 mg by mouth 2 (two) times daily with a meal.  . [DISCONTINUED] tiZANidine (ZANAFLEX) 2 MG tablet Take by mouth every 6 (six) hours as needed for muscle spasms.  Marland Kitchen amLODipine (NORVASC) 10 MG tablet Take 1 tablet (10 mg total) by mouth daily.  Marland Kitchen atorvastatin (LIPITOR) 40 MG tablet Take 1 tablet (40 mg total) by mouth daily.  . carvedilol (COREG) 12.5 MG tablet Take 1 tablet (12.5 mg total) by mouth 2 (two) times daily with a meal.  . metFORMIN (GLUCOPHAGE) 1000 MG tablet Take 1 tablet (1,000 mg total) by mouth daily.  . tizanidine (ZANAFLEX) 2 MG capsule Take 1-2 capsules (2-4 mg total) by mouth at bedtime as needed  for muscle spasms.  . [DISCONTINUED] butalbital-aspirin-caffeine (FIORINAL) 50-325-40 MG capsule Take 1 capsule by mouth 2 (two) times daily as needed for headache.  . [DISCONTINUED] lisinopril (PRINIVIL,ZESTRIL) 20 MG tablet Take 20 mg by mouth daily.  . [DISCONTINUED] metoprolol (LOPRESSOR) 50 MG tablet Take 50 mg by mouth 2 (two) times daily.   No facility-administered encounter medications on file as of 03/25/2016.    Follow-up: 2 weeks with clinical pharmacist  Brandon

## 2016-03-25 NOTE — Progress Notes (Signed)
Pre visit review using our clinic review tool, if applicable. No additional management support is needed unless otherwise documented below in the visit note. 

## 2016-03-25 NOTE — Assessment & Plan Note (Signed)
Uncontrolled. A1C today. Metformin refilled (okay given current renal function). Likely needs insulin.

## 2016-03-25 NOTE — Assessment & Plan Note (Signed)
Stable. Patient is to continue dual antiplatelet therapy and take her blood pressure medication as prescribed. Continue Lipitor.

## 2016-03-25 NOTE — Assessment & Plan Note (Signed)
Uncontrolled. I suspect this if from noncompliance. Continue Lipitor 40 mg daily.

## 2016-03-25 NOTE — Assessment & Plan Note (Signed)
Uncontrolled. Unsure of meds. After extensive review of the electronic medical record, I am placing the patient on Coreg 12.5 mg twice daily and amlodipine 10 mg daily. No ACEI at this time due to prior AKI and Hyperkalemia (this is happened on 2 separate occasions with 2 different ACE inhibitors). F/U in 2 weeks.

## 2016-03-25 NOTE — Assessment & Plan Note (Signed)
Unsure of what patient is taking for this. Neurology told the patient to stop the fiorinal and use Zanaflex (she does not appear to be doing this). I advised the patient to follow up with neurology.

## 2016-03-25 NOTE — Patient Instructions (Addendum)
Take all of the medications on your current list.  Bring all of your medications to the next visit.  Follow up with Neurology regarding your Headache - Dr. Ulice Dash at [facility].   We will call with your lab results.  Follow up in 2 weeks.   Take care  Dr. Lacinda Axon

## 2016-03-28 ENCOUNTER — Telehealth: Payer: Self-pay | Admitting: Family Medicine

## 2016-03-28 MED ORDER — ONETOUCH ULTRA SYSTEM W/DEVICE KIT: 1.0000 | PACK | Freq: Once | 0 refills | Status: AC

## 2016-03-28 NOTE — Telephone Encounter (Signed)
Spoke with the patient and sent rx to pharmacy for a new meter. thanks

## 2016-03-28 NOTE — Telephone Encounter (Signed)
Pt lvm stating that Dr. Lacinda Axon asked her to keep track of her blood sugar each day but she said that she can not because her meter is no longer working. Pt would like cb.

## 2016-04-08 ENCOUNTER — Ambulatory Visit: Payer: Medicare Other | Admitting: Family Medicine

## 2016-04-08 ENCOUNTER — Encounter: Payer: Self-pay | Admitting: Family Medicine

## 2016-04-08 ENCOUNTER — Ambulatory Visit (INDEPENDENT_AMBULATORY_CARE_PROVIDER_SITE_OTHER): Payer: Medicare Other | Admitting: Family Medicine

## 2016-04-08 VITALS — BP 140/74 | HR 61 | Temp 97.7°F | Wt 83.2 lb

## 2016-04-08 DIAGNOSIS — I2510 Atherosclerotic heart disease of native coronary artery without angina pectoris: Secondary | ICD-10-CM

## 2016-04-08 DIAGNOSIS — E785 Hyperlipidemia, unspecified: Secondary | ICD-10-CM | POA: Diagnosis not present

## 2016-04-08 DIAGNOSIS — Z91148 Patient's other noncompliance with medication regimen for other reason: Secondary | ICD-10-CM

## 2016-04-08 DIAGNOSIS — I10 Essential (primary) hypertension: Secondary | ICD-10-CM | POA: Diagnosis not present

## 2016-04-08 DIAGNOSIS — Z9114 Patient's other noncompliance with medication regimen: Secondary | ICD-10-CM

## 2016-04-08 DIAGNOSIS — E1151 Type 2 diabetes mellitus with diabetic peripheral angiopathy without gangrene: Secondary | ICD-10-CM

## 2016-04-08 DIAGNOSIS — IMO0002 Reserved for concepts with insufficient information to code with codable children: Secondary | ICD-10-CM

## 2016-04-08 DIAGNOSIS — E1165 Type 2 diabetes mellitus with hyperglycemia: Secondary | ICD-10-CM

## 2016-04-08 NOTE — Assessment & Plan Note (Signed)
Uncontrolled. Advised to check sugars. Continuing metformin at this time. Once can ensure compliance will plan to add other med or insulin.

## 2016-04-08 NOTE — Assessment & Plan Note (Signed)
Advised compliance with Lipitor.

## 2016-04-08 NOTE — Assessment & Plan Note (Signed)
Stable. Needs to be compliant with meds. Arranging home health.

## 2016-04-08 NOTE — Progress Notes (Signed)
Pre visit review using our clinic review tool, if applicable. No additional management support is needed unless otherwise documented below in the visit note. 

## 2016-04-08 NOTE — Assessment & Plan Note (Signed)
Stable

## 2016-04-08 NOTE — Progress Notes (Signed)
    S:    Patient arrives in good spirits in a wheelchair accompanied by her daughter and son.  Presents pharmacist review of her medications at the request of Dr Lacinda Axon. She has not picked up any of her medications from [facility] that were sent in at her last visit on 10/9 and also denies checking her blood glucose due to not picking up her new meter from [place].  Patient states that she could not afford them and was hesitant to ask her family for assistance.   All medications have been reviewed with pharmacist, however patient is not sure of exactly which medications she is taking.   O:  Lab Results  Component Value Date   HGBA1C 9.7 (H) 03/25/2016   Vitals:   04/08/16 0830  BP: 140/74  Pulse: 61  Temp: 97.7 F (36.5 C)    A/P: Medication Adherence: Patient is currently nonadherent to her medications and is unsure of what she is taking.  Her son plans to assist her financially with picking up her medications today. Counseled on importance of being adherent to her medications to prevent cardiovascular and diabetes complications.   Patient was seen prior to her visit with Dr Lacinda Axon.  Written patient instructions provided.  Total time in face to face counseling 15 minutes.   Follow up in Pharmacist Clinic Visit via telephone in 1 week.     Bennye Alm, PharmD [facility] PGY2 Pharmacy Resident [phone]

## 2016-04-08 NOTE — Progress Notes (Signed)
Subjective:  Patient ID: Tiffany Bailey, female    DOB: 1927/09/16  Age: 82 y.o. MRN: [id number]  CC: Follow up/medication review.   HPI:  82 year old female with a PMH, DM-2 with complications, PVD, HTN, HLD, GERD, CAD s/p CABG/multiple PCI, Hx of breast cancer, CKD III, Anemia presents for follow up.  DM-2  Not checking sugars.  Has not picked up glucometer.  Endorses compliance with metformin but she does not appear to know her medications very well. She does not have her medications with her today.  Recent A1c was 9.7.  CAD  Patient a brief episode of sharp chest pain but this resolved very quickly.  Once again, I'm not sure whether she is taking her medications properly. She is post be on aspirin, Plavix, Lipitor. She endorses that she's not picked up her Lipitor.  HLD  Not at goal.  Not taking Lipitor as has not picked it up from the pharmacy.  HTN  At goal today.  Social Hx   Social History   Social History  . Marital status: Divorced    Spouse name: N/A  . Number of children: N/A  . Years of education: N/A   Social History Main Topics  . Smoking status: Never Smoker  . Smokeless tobacco: Never Used  . Alcohol use No  . Drug use: No  . Sexual activity: Not Currently   Other Topics Concern  . None   Social History Narrative  . None    Review of Systems  Cardiovascular: Positive for chest pain.  Neurological: Positive for headaches.   Objective:  BP 140/74 (BP Location: Right Arm, Patient Position: Sitting, Cuff Size: Normal)   Pulse 61   Temp 97.7 F (36.5 C) (Oral)   Wt 83 lb 3.2 oz (37.7 kg)   SpO2 97%   BMI 16.25 kg/m   BP/Weight 04/08/2016 [id number]  Systolic BP [id number]  Diastolic BP 74 88 61  Wt. (Lbs) 83.2 79.25 -  BMI 16.25 15.48 15.82   Physical Exam  Constitutional: No distress.  Cardiovascular: Normal rate and regular rhythm.   Pulmonary/Chest: Effort normal. She has no wheezes. She has no rales.    Neurological: She is alert.  Psychiatric: She has a normal mood and affect.  Vitals reviewed.  Lab Results  Component Value Date   WBC 7.6 03/25/2016   HGB 9.6 (L) 03/25/2016   HCT 28.6 (L) 03/25/2016   PLT 280.0 03/25/2016   GLUCOSE 319 (H) 02/25/2015   ALT 14 02/25/2015   AST 29 02/25/2015   NA 132 (L) 02/25/2015   K 4.9 02/25/2015   CL 97 (L) 02/25/2015   CREATININE 1.07 (H) 02/25/2015   BUN 22 (H) 02/25/2015   CO2 23 02/25/2015   HGBA1C 9.7 (H) 03/25/2016   Assessment & Plan:   Problem List Items Addressed This Visit    Uncontrolled type 2 DM with peripheral circulatory disorder ([place])    Uncontrolled. Advised to check sugars. Continuing metformin at this time. Once can ensure compliance will plan to add other med or insulin.      Hyperlipidemia    Advised compliance with Lipitor.      Essential hypertension    Stable.      CAD (coronary artery disease)    Stable. Needs to be compliant with meds. Arranging home health.       Other Visit Diagnoses    Noncompliance with medications    -  Primary   Relevant Orders  Ambulatory referral to [facility] ordered this encounter  Medications  . butalbital-aspirin-caffeine (FIORINAL) 50-325-40 MG capsule    Sig: Take 1 capsule by mouth every 6 (six) hours as needed for headache.  Marland Kitchen acetaminophen (TYLENOL) 500 MG chewable tablet    Sig: Chew 500 mg by mouth every 6 (six) hours as needed for pain.   Follow-up: Return in about 1 month (around 05/09/2016).  [place]

## 2016-04-08 NOTE — Patient Instructions (Signed)
Take your medications as prescribed.  Please start checking your blood sugars daily (fasting).  Keep a log of them for me (write them down in a notebook).  We will have someone help out at home.  We will investigate your neurology appt.  Follow up in 1 month.  Take care  Dr. Lacinda Axon

## 2016-04-10 ENCOUNTER — Other Ambulatory Visit: Payer: Self-pay | Admitting: Family Medicine

## 2016-04-10 MED ORDER — PANTOPRAZOLE SODIUM 40 MG PO TBEC: 40.0000 mg | DELAYED_RELEASE_TABLET | Freq: Every day | ORAL | 0 refills | Status: DC

## 2016-04-10 MED ORDER — MIRTAZAPINE 15 MG PO TABS: 15.0000 mg | ORAL_TABLET | Freq: Every day | ORAL | 0 refills | Status: DC

## 2016-04-10 MED ORDER — PANTOPRAZOLE SODIUM 40 MG PO TBEC: 40.0000 mg | DELAYED_RELEASE_TABLET | Freq: Every day | ORAL | 1 refills | Status: DC

## 2016-04-10 NOTE — Addendum Note (Signed)
Addended by: Carmin Muskrat on: 04/10/2016 02:12 PM   Modules accepted: Orders

## 2016-04-10 NOTE — Telephone Encounter (Signed)
Pt insurance called [id number]  from [place] [employer] regarding needing a refill for mirtazapine (REMERON) 15 MG tablet and pantoprazole (PROTONIX) 40 MG tablet.   Pharmacy is [facility] [id number] - [place] [place] [address]. Thank you!

## 2016-04-10 NOTE — Telephone Encounter (Signed)
Historical medication. Last seen 04/08/16. Please advise?

## 2016-04-15 ENCOUNTER — Telehealth: Payer: Self-pay | Admitting: Pharmacist

## 2016-04-15 DIAGNOSIS — E1165 Type 2 diabetes mellitus with hyperglycemia: Secondary | ICD-10-CM

## 2016-04-15 DIAGNOSIS — IMO0002 Reserved for concepts with insufficient information to code with codable children: Secondary | ICD-10-CM

## 2016-04-15 DIAGNOSIS — E1151 Type 2 diabetes mellitus with diabetic peripheral angiopathy without gangrene: Secondary | ICD-10-CM

## 2016-04-15 MED ORDER — ONETOUCH LANCETS MISC: 3 refills | Status: DC

## 2016-04-15 MED ORDER — GLUCOSE BLOOD VI STRP: ORAL_STRIP | 3 refills | Status: DC

## 2016-04-15 NOTE — Telephone Encounter (Addendum)
04/15/16  S:    Telephone followup for medication adherence and diabetes management. Patient was referred on 04/08/16.  Patient was last seen by Primary Care Provider on 04/08/16 and did not know which medications she has been taking and had not picked her prescribed medications up due to cost.   Patient reports adherence with medications. She has not started checking her blood glucose due to not having strips but did pick up a One Touch Meter from the pharmacy. Current diabetes medications include: metformin 1000 mg daily  Current hypertension medications include: amlodipine 10 mg daily, carvedilol 12.5 mg BID, metoprolol tartrate 50 mg BID  Patient denies hypoglycemic events.    O:  Lab Results  Component Value Date   HGBA1C 9.7 (H) 03/25/2016   A/P: Diabetes longstanding diagnosed currently uncontrolled.  Patient reports adherence with medications via telephone and states that she has picked up all of her medications except for her blood glucose test strips. Diabetes control is suboptimal due to non-adherence. Counseled patient on importance of adherence to her medications and checking her blood glucose to prevent diabetes and cardiovascular complications. [place] an a prescription for glucose test strips was not sent in.  Sent in a prescription for One Touch test strips and lancets.  Patient plans to pick up test strips today and I have instructed patient to check blood glucose at least once daily and write the values down. Will followup via telephone in 1 week.    Hypertension longstanding which was controlled at last visit. Lisinopril and Enalapril previously stopped due to AKI and hyperkalemia.  Today via telephone she states she is taking both metoprolol tartrate and carvedilol. She has been prescribed carvedilol and should not be taking metoprolol tartrate.  I have instructed her to stop taking metoprolol and to discard this medication to reduce confusion. Will followup  blood pressure at next clinic visit.   Bennye Alm, PharmD [facility] PGY2 Pharmacy Resident [phone]

## 2016-04-22 ENCOUNTER — Telehealth: Payer: Self-pay | Admitting: Pharmacist

## 2016-04-22 DIAGNOSIS — IMO0002 Reserved for concepts with insufficient information to code with codable children: Secondary | ICD-10-CM

## 2016-04-22 DIAGNOSIS — E1165 Type 2 diabetes mellitus with hyperglycemia: Secondary | ICD-10-CM

## 2016-04-22 DIAGNOSIS — E1151 Type 2 diabetes mellitus with diabetic peripheral angiopathy without gangrene: Secondary | ICD-10-CM

## 2016-04-22 NOTE — Telephone Encounter (Signed)
Telephone followup for medication adherence and diabetes management. Patient was referred on 04/08/16.  Patient was last seen by Primary Care Provider on 04/08/16 and did not know which medications she has been taking and had not picked her prescribed medications up due to cost.   Patient states she picked up her One Touch strips from the pharmacy but keeps getting an error message. She states she may have put the blood directly in the meter and messed up the meter.  She is unable to read the error message on the screen due to difficulty with her vision. When asked if we could go over her medications via telephone she states she would like me to call back a different day as someone has moved her medications.   Plan: Will place referral to [facility] for home visit Will followup in 2-3 days via telephone and schedule return clinic visit for next week pending [facility] referral    Bennye Alm, PharmD [facility] PGY2 Pharmacy Resident [phone]

## 2016-04-24 NOTE — Telephone Encounter (Signed)
04/24/16   Called patient today to schedule pharmacist clinic visit for next week. Instructed patient to bring bag of all her medications to that visit and to bring her blood glucose meter with her as well.  Patient demonstrated understanding.  [facility] care management responded to the referral and they state that patient is not eligible for [facility] care management services.  Plan:  Pharmacist clinic visit next week to assess adherence and to determine problem with patients blood glucose meter.  Bennye Alm, PharmD [facility] PGY2 Pharmacy Resident [phone]

## 2016-04-29 ENCOUNTER — Encounter: Payer: Self-pay | Admitting: Pharmacist

## 2016-04-29 ENCOUNTER — Ambulatory Visit (INDEPENDENT_AMBULATORY_CARE_PROVIDER_SITE_OTHER): Payer: Medicare Other | Admitting: Pharmacist

## 2016-04-29 DIAGNOSIS — E1165 Type 2 diabetes mellitus with hyperglycemia: Secondary | ICD-10-CM

## 2016-04-29 DIAGNOSIS — IMO0002 Reserved for concepts with insufficient information to code with codable children: Secondary | ICD-10-CM

## 2016-04-29 DIAGNOSIS — E1151 Type 2 diabetes mellitus with diabetic peripheral angiopathy without gangrene: Secondary | ICD-10-CM | POA: Diagnosis not present

## 2016-04-29 MED ORDER — INSULIN PEN NEEDLE 32G X 4 MM MISC: 1.0000 | Freq: Every day | 3 refills | Status: DC

## 2016-04-29 MED ORDER — BASAGLAR KWIKPEN 100 UNIT/ML ~~LOC~~ SOPN: 6.0000 [IU] | PEN_INJECTOR | Freq: Every day | SUBCUTANEOUS | 0 refills | Status: DC

## 2016-04-29 NOTE — Patient Instructions (Addendum)
Thank you for coming in today  Please start Basaglar 6 units injection once daily  Please call the clinic if you have low blood sugars or blood glucose less than 70.  Please check your blood glucose once daily before breakfast.    Please bring your meter and medications to your next visit with Dr Lacinda Axon in 2 weeks.

## 2016-04-29 NOTE — Progress Notes (Signed)
Patient seen briefly w/ Dr. Zara Council. Agree with plan of care. Okay to proceed with insulin therapy as described.

## 2016-04-29 NOTE — Progress Notes (Signed)
    S:    Patient arrives in good spirits ambulating in her wheelchair with assistance of her son.  Presents for diabetes evaluation, education, and management at the request of Dr Lacinda Axon. Patient was referred on 04/08/16.  Patient was last seen by Primary Care Provider on 04/08/16.    Patient reports adherence with medications and brought them to clinic today for review.  Current diabetes medications include: metformin 1000 mg daily  Current hypertension medications include: carvedilol 12.5 mg BID, amlodipine 10 mg daily   Patient denies hypoglycemic events.  Patient reported dietary habits: Eats 2-3 meals/day Breakfast: bacon, biscuits, sometimes egg, coffee or cereal or oatmeal.  Lunch:chicken, porkchops, green beans Dinner: sandwich or peanut butter crackers Drinks: Regular Coke, Gatorade.   Patient reported exercise habits: Wheelchair Bound   Patient reports nocturia 3-4 times.  Patient reports neuropathy. Patient denies visual changes. Patient reports self foot exams.   She has not been monitoring her blood glucose due to having difficulty with her meter.  O:  Lab Results  Component Value Date   HGBA1C 9.7 (H) 03/25/2016   Vitals:   04/29/16 1010  BP: 138/64  Pulse: 63   2 hour post-prandial/random CBG: (In Clinic Today): 252 mg/dL.  A/P: Diabetes longstanding diagnosed currently uncontrolled. Patient denies hypoglycemic events and is able to verbalize appropriate hypoglycemia management plan. Patient reports adherence with medication. Control is suboptimal due to diet and insulin resistance.  Following discussion with Dr Lacinda Axon, the following diabetes medication changes were made:  Started basal insulin Lantus (insulin glargine) 6 units daily.  Counseled patient on proper dosing/administration and side effects including hypoglycemia. Discussed s/sx and treatment of hypoglycemia and provided patient with "Know the Signs of Hypoglycemia" Handout. Will continue metformin  1000 mg daily. Trial of increased metformin dose, GLP-1 or SGLT2 is limited by patients weight and renal function. Discussed low carbohydrate diet and stopping gatorade and regular soda and trying sugar free flavored water packets. Next A1C anticipated 2 months.    ASCVD risk greater than 7.5%. Continued Aspirin 81 mg and Continued atorvastatin 40 mg.   Hypertension longstanding diagnosed currently controlled.  Patient reports adherence with medication.   Written patient instructions provided.  Total time in face to face counseling 45 minutes.  Patient was seen with Dr Thersa Salt today in clinic and medication changes were discussed and approved prior to initiation. Follow up with Dr Lacinda Axon in 2 weeks.

## 2016-05-03 ENCOUNTER — Telehealth: Payer: Self-pay | Admitting: *Deleted

## 2016-05-03 NOTE — Telephone Encounter (Signed)
Pt stated that she took her insulin on Monday before eating ,she became sweaty and weak, her son took her to eat, she felt better after, however she did not check her blood sugar before or after to get a reading. Today pt. reported a blood sugar of 95 with out insulin and after eating.  Pt contact [phone]

## 2016-05-03 NOTE — Telephone Encounter (Signed)
What could be causing symptoms?

## 2016-05-06 ENCOUNTER — Telehealth: Payer: Self-pay | Admitting: Pharmacist

## 2016-05-06 NOTE — Telephone Encounter (Signed)
05/06/16  Called patient to discuss her episode of feeling sweaty and weak. She states she felt this way when she took the basaglar before eating but when she takes if after she eats she is fine.   Reports blood glucose 256, 252, and 95 mg/dL.  She states the last few times she checked her BG it has been 95 mg/dL. Reports proper treatment of her one episode of hypoglycemia with orange juice.     Plan: Per Dr Geralynn Ochs recommendation will decrease Basaglar to 3 units daily.  Patient stated understanding of this change. Discussed signs/symptoms and treatment of hypoglycemia Instructed patient to bring medications, CBG meter, and CBG log to her visit next week with Dr Lacinda Axon.

## 2016-05-06 NOTE — Telephone Encounter (Signed)
History suggestive of hypoglycemia. Decrease insulin to 3 units.

## 2016-05-13 ENCOUNTER — Ambulatory Visit (INDEPENDENT_AMBULATORY_CARE_PROVIDER_SITE_OTHER): Payer: Medicare Other | Admitting: Family Medicine

## 2016-05-13 ENCOUNTER — Encounter: Payer: Self-pay | Admitting: Family Medicine

## 2016-05-13 DIAGNOSIS — E1151 Type 2 diabetes mellitus with diabetic peripheral angiopathy without gangrene: Secondary | ICD-10-CM | POA: Diagnosis not present

## 2016-05-13 DIAGNOSIS — D649 Anemia, unspecified: Secondary | ICD-10-CM | POA: Diagnosis not present

## 2016-05-13 DIAGNOSIS — IMO0002 Reserved for concepts with insufficient information to code with codable children: Secondary | ICD-10-CM

## 2016-05-13 DIAGNOSIS — E1165 Type 2 diabetes mellitus with hyperglycemia: Secondary | ICD-10-CM

## 2016-05-13 DIAGNOSIS — E785 Hyperlipidemia, unspecified: Secondary | ICD-10-CM | POA: Diagnosis not present

## 2016-05-13 DIAGNOSIS — I10 Essential (primary) hypertension: Secondary | ICD-10-CM

## 2016-05-13 NOTE — Progress Notes (Signed)
Pre visit review using our clinic review tool, if applicable. No additional management support is needed unless otherwise documented below in the visit note. 

## 2016-05-13 NOTE — Progress Notes (Signed)
   Subjective:  Patient ID: Tiffany Bailey, female    DOB: Dec 17, 1927  Age: 82 y.o. MRN: [id number]  CC: Follow up   HPI:  82 year old female with hypertension, hyperlipidemia, uncontrolled type 2 diabetes, coronary artery disease, anemia presents for follow-up.  DM-2   Blood sugars improving (100's - 150's).  Current on Basaglar 3 units and metformin.  Tolerating without difficulty.  HTN  Has been well controlled, but BP elevated today.  On Norvasc, Coreg.  HLD  Not at goal.  Patient now complaint with lipitor.   Social Hx   Social History   Social History  . Marital status: Divorced    Spouse name: N/A  . Number of children: N/A  . Years of education: N/A   Social History Main Topics  . Smoking status: Never Smoker  . Smokeless tobacco: Never Used  . Alcohol use No  . Drug use: No  . Sexual activity: Not Currently   Other Topics Concern  . None   Social History Narrative  . None    Review of Systems  Constitutional: Negative.   Respiratory: Negative.   Cardiovascular: Negative.   Neurological: Positive for headaches.   Objective:  BP (!) 158/56 (BP Location: Right Arm, Patient Position: Sitting, Cuff Size: Normal)   Pulse 66   Temp 98.1 F (36.7 C) (Oral)   Resp 18   Wt 83 lb 6 oz (37.8 kg)   SpO2 98%   BMI 16.28 kg/m   BP/Weight 05/13/2016 04/29/2016 0000000  Systolic BP 0000000 0000000 [id number]  Diastolic BP 56 64 74  Wt. (Lbs) 83.38 86 83.2  BMI 16.28 16.8 16.25   Physical Exam  Constitutional: She is oriented to person, place, and time.  Frail, thin elderly female in no acute distress. Sitting wheelchair.  Cardiovascular: Normal rate and regular rhythm.   Pulmonary/Chest: Effort normal and breath sounds normal.  Neurological: She is alert and oriented to person, place, and time.  Psychiatric: She has a normal mood and affect.  Vitals reviewed.  Lab Results  Component Value Date   WBC 7.6 03/25/2016   HGB 9.6 (L) 03/25/2016   HCT 28.6  (L) 03/25/2016   PLT 280.0 03/25/2016   GLUCOSE 319 (H) 02/25/2015   ALT 14 02/25/2015   AST 29 02/25/2015   NA 132 (L) 02/25/2015   K 4.9 02/25/2015   CL 97 (L) 02/25/2015   CREATININE 1.07 (H) 02/25/2015   BUN 22 (H) 02/25/2015   CO2 23 02/25/2015   HGBA1C 9.7 (H) 03/25/2016    Assessment & Plan:   Problem List Items Addressed This Visit    Uncontrolled type 2 DM with peripheral circulatory disorder ([place])    Blood sugars improving. Continue metformin and insulin. Follow-up in 3 months.      Hyperlipidemia    Stable. Will recheck at next visit.      Essential hypertension    BP elevated today. I suspect noncompliance with medication. Patient is elderly and frail. We'll not add additional medication at this visit. Follow-up in 3 months.      Anemia    Appears stable based on prior hemoglobins. We will proceed with further workup with iron studies at next visit.         Follow-up: Return in about 3 months (around 08/13/2016).  [place]

## 2016-05-13 NOTE — Patient Instructions (Signed)
Continue your meds.  Keep an eye on your blood pressure.  Follow up in 3 months.  Take care  Dr. Lacinda Axon

## 2016-05-14 NOTE — Assessment & Plan Note (Signed)
Blood sugars improving. Continue metformin and insulin. Follow-up in 3 months.

## 2016-05-14 NOTE — Assessment & Plan Note (Signed)
Appears stable based on prior hemoglobins. We will proceed with further workup with iron studies at next visit.

## 2016-05-14 NOTE — Assessment & Plan Note (Signed)
Stable. Will recheck at next visit.

## 2016-05-14 NOTE — Assessment & Plan Note (Signed)
BP elevated today. I suspect noncompliance with medication. Patient is elderly and frail. We'll not add additional medication at this visit. Follow-up in 3 months.

## 2016-05-29 ENCOUNTER — Other Ambulatory Visit: Payer: Self-pay | Admitting: Family

## 2016-05-29 ENCOUNTER — Ambulatory Visit
Admission: RE | Admit: 2016-05-29 | Discharge: 2016-05-29 | Disposition: A | Discharge status: Home / Self Care | Payer: Medicare Other | Source: Ambulatory Visit | Attending: Family | Admitting: Family

## 2016-05-29 ENCOUNTER — Emergency Department
Admission: EM | Admit: 2016-05-29 | Discharge: 2016-05-29 | Disposition: A | Discharge status: Home / Self Care | Payer: Medicare Other | Attending: Emergency Medicine | Admitting: Emergency Medicine

## 2016-05-29 ENCOUNTER — Telehealth: Payer: Self-pay | Admitting: Family

## 2016-05-29 ENCOUNTER — Ambulatory Visit (INDEPENDENT_AMBULATORY_CARE_PROVIDER_SITE_OTHER): Payer: Medicare Other | Admitting: Family

## 2016-05-29 ENCOUNTER — Encounter: Payer: Self-pay | Admitting: Family

## 2016-05-29 ENCOUNTER — Telehealth: Payer: Self-pay | Admitting: Family Medicine

## 2016-05-29 VITALS — BP 140/70 | HR 64 | Wt 84.0 lb

## 2016-05-29 DIAGNOSIS — I739 Peripheral vascular disease, unspecified: Secondary | ICD-10-CM

## 2016-05-29 DIAGNOSIS — Z7982 Long term (current) use of aspirin: Secondary | ICD-10-CM | POA: Insufficient documentation

## 2016-05-29 DIAGNOSIS — I129 Hypertensive chronic kidney disease with stage 1 through stage 4 chronic kidney disease, or unspecified chronic kidney disease: Secondary | ICD-10-CM | POA: Diagnosis not present

## 2016-05-29 DIAGNOSIS — G546 Phantom limb syndrome with pain: Secondary | ICD-10-CM | POA: Diagnosis not present

## 2016-05-29 DIAGNOSIS — Z79899 Other long term (current) drug therapy: Secondary | ICD-10-CM | POA: Insufficient documentation

## 2016-05-29 DIAGNOSIS — N183 Chronic kidney disease, stage 3 (moderate): Secondary | ICD-10-CM | POA: Insufficient documentation

## 2016-05-29 DIAGNOSIS — M79662 Pain in left lower leg: Secondary | ICD-10-CM | POA: Diagnosis present

## 2016-05-29 DIAGNOSIS — M79605 Pain in left leg: Secondary | ICD-10-CM | POA: Diagnosis not present

## 2016-05-29 DIAGNOSIS — I70209 Unspecified atherosclerosis of native arteries of extremities, unspecified extremity: Secondary | ICD-10-CM | POA: Insufficient documentation

## 2016-05-29 DIAGNOSIS — I2510 Atherosclerotic heart disease of native coronary artery without angina pectoris: Secondary | ICD-10-CM | POA: Insufficient documentation

## 2016-05-29 DIAGNOSIS — Z794 Long term (current) use of insulin: Secondary | ICD-10-CM | POA: Insufficient documentation

## 2016-05-29 DIAGNOSIS — Z853 Personal history of malignant neoplasm of breast: Secondary | ICD-10-CM | POA: Insufficient documentation

## 2016-05-29 DIAGNOSIS — E1122 Type 2 diabetes mellitus with diabetic chronic kidney disease: Secondary | ICD-10-CM | POA: Diagnosis not present

## 2016-05-29 LAB — CBC WITH DIFFERENTIAL/PLATELET
BASOS PCT: 0.6 % (ref 0.0–3.0)
Basophils Absolute: 0 10*3/uL (ref 0.0–0.1)
EOS PCT: 3.1 % (ref 0.0–5.0)
Eosinophils Absolute: 0.2 10*3/uL (ref 0.0–0.7)
HEMATOCRIT: 31.1 % — AB (ref 36.0–46.0)
HEMOGLOBIN: 10.4 g/dL — AB (ref 12.0–15.0)
LYMPHS PCT: 28.6 % (ref 12.0–46.0)
Lymphs Abs: 1.9 10*3/uL (ref 0.7–4.0)
MCHC: 33.4 g/dL (ref 30.0–36.0)
MCV: 90.1 fl (ref 78.0–100.0)
Monocytes Absolute: 0.8 10*3/uL (ref 0.1–1.0)
Monocytes Relative: 11.3 % (ref 3.0–12.0)
Neutro Abs: 3.8 10*3/uL (ref 1.4–7.7)
Neutrophils Relative %: 56.4 % (ref 43.0–77.0)
Platelets: 267 10*3/uL (ref 150.0–400.0)
RBC: 3.45 Mil/uL — AB (ref 3.87–5.11)
RDW: 13.8 % (ref 11.5–15.5)
WBC: 6.7 10*3/uL (ref 4.0–10.5)

## 2016-05-29 MED ORDER — ONDANSETRON HCL 4 MG/2ML IJ SOLN
4.0000 mg | Freq: Once | INTRAMUSCULAR | Status: AC
  Administered 2016-05-29: 4 mg via INTRAVENOUS
  Filled 2016-05-29: qty 2

## 2016-05-29 MED ORDER — MORPHINE SULFATE (PF) 4 MG/ML IV SOLN
4.0000 mg | Freq: Once | INTRAVENOUS | Status: AC
  Administered 2016-05-29: 4 mg via INTRAVENOUS
  Filled 2016-05-29: qty 1

## 2016-05-29 NOTE — Telephone Encounter (Signed)
Your 2:15 p.m.

## 2016-05-29 NOTE — ED Triage Notes (Signed)
Leg pain to left leg behind knee. Pt had outpatient test today and was positive for blood clot. 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

## 2016-05-29 NOTE — Patient Instructions (Addendum)
I want to ensure you have good circulation with your leg and no clots.  Pending studies at [facility]  Lab work as well  Will call with results.

## 2016-05-29 NOTE — Telephone Encounter (Signed)
FYI

## 2016-05-29 NOTE — Telephone Encounter (Signed)
Patient Name: Tiffany Bailey  DOB: 1927-06-27    Initial Comment Caller states having pain in leg and foot, left leg has been taken off   Nurse Assessment  Nurse: Leilani Merl, RN, Heather Date/Time (Eastern Time): 05/29/2016 11:34:56 AM  Confirm and document reason for call. If symptomatic, describe symptoms. ---Caller states having pain in left leg and foot, left leg has been taken off. It started last night.  Does the patient have any new or worsening symptoms? ---Yes  Will a triage be completed? ---Yes  Related visit to physician within the last 2 weeks? ---No  Does the PT have any chronic conditions? (i.e. diabetes, asthma, etc.) ---Yes  List chronic conditions. ---See MR  Is this a behavioral health or substance abuse call? ---No     Guidelines    Guideline Title Affirmed Question Affirmed Notes  Leg Pain [1] SEVERE pain (e.g., excruciating, unable to do any normal activities) AND [2] not improved after 2 hours of pain medicine    Final Disposition User   See Physician within 4 Hours (or PCP triage) Leilani Merl, RN, Nira Conn    Comments  Appt scheduled today with Joycelyn Schmid at 2:15 pm   Referrals  REFERRED TO PCP OFFICE   Disagree/Comply: Comply

## 2016-05-29 NOTE — ED Notes (Signed)
Pt c/o severe leg pain to amputated RT lower extremety since last night. Pt c/o more pain than usual. Pt was seen at PCP today and was told she had blood clot. No swelling or warm to touch noted to extremity.  No redness noted to area

## 2016-05-29 NOTE — Telephone Encounter (Signed)
On Tiffany Bailey's 2:15 p.m. [facility] call routed to [facility].

## 2016-05-29 NOTE — Telephone Encounter (Signed)
Pt called about having phantom pain in left leg pt leg was amputated. Pt did take a tylenol and it still is paining her. There is no puss or blood coming from amputation just pain.   Pt was transferred to [facility].   Call pt @ [phone].

## 2016-05-29 NOTE — Progress Notes (Signed)
Subjective:    Patient ID: Tiffany Bailey, female    DOB: 02-Dec-1927, 82 y.o.   MRN: [id number]  CC: Tiffany Bailey is a 82 y.o. female who presents today for an acute visit.    HPI: CC: left leg pain started all the sudden last night, s/p AKA 2013 due to PVD, DM. No injury. Tried tyleonol. No fever, SOB. States since AKA near the incision and midway through thigh 'feels cold as ice'; this not new. Pain improves with holding thigh. Constant. Aching and stabbing.        HISTORY:  Past Medical History:  Diagnosis Date  . Anemia   . Arthritis   . CKD (chronic kidney disease), stage III   . Coronary artery disease    S/P CABG and multiple PCI's  . Failure to thrive in adult   . GERD (gastroesophageal reflux disease)   . History of breast cancer   . Hyperlipidemia   . Hypertension    Past Surgical History:  Procedure Laterality Date  . ABDOMINAL HYSTERECTOMY    . ABOVE KNEE LEG AMPUTATION Left 2013  . ANTERIOR VITRECTOMY Left 11/16/2015   Procedure: ANTERIOR VITRECTOMY;  Surgeon: Eulogio Bear, MD;  Location: [facility] ORS;  Service: Ophthalmology;  Laterality: Left;  . BLADDER SURGERY    . CATARACT EXTRACTION W/PHACO Left 11/16/2015   Procedure: CATARACT EXTRACTION PHACO AND INTRAOCULAR LENS PLACEMENT (IOC);  Surgeon: Eulogio Bear, MD;  Location: [facility] ORS;  Service: Ophthalmology;  Laterality: Left;  Lot # [id number] H [place]; 01:22.8 AP%:12.6 CDE: 10.46  . CORONARY ANGIOPLASTY     STENT  . CORONARY ARTERY BYPASS GRAFT    . MASTECTOMY    . TUMOR REMOVAL     ABDOMINAL   Family History  Problem Relation Age of Onset  . Sudden death Mother   . Arthritis Father   . Stroke Father   . Breast cancer Sister   . Diabetes Grandchild     Allergies: Amoxicillin; Gabapentin; and Other Current Outpatient Prescriptions on File Prior to Visit  Medication Sig Dispense Refill  . acetaminophen (TYLENOL) 500 MG chewable tablet Chew 500 mg by mouth every 6 (six) hours as needed for pain.     Marland Kitchen amLODipine (NORVASC) 10 MG tablet Take 1 tablet (10 mg total) by mouth daily. 90 tablet 3  . aspirin 81 MG tablet Take 81 mg by mouth daily.    Marland Kitchen atorvastatin (LIPITOR) 40 MG tablet Take 1 tablet (40 mg total) by mouth daily. 90 tablet 3  . brimonidine-timolol (COMBIGAN) 0.2-0.5 % ophthalmic solution Place 1 drop into both eyes every 12 (twelve) hours.    . butalbital-aspirin-caffeine (FIORINAL) 50-325-40 MG capsule Take 1 capsule by mouth every 6 (six) hours as needed for headache.    . carvedilol (COREG) 12.5 MG tablet Take 1 tablet (12.5 mg total) by mouth 2 (two) times daily with a meal. 180 tablet 3  . clopidogrel (PLAVIX) 75 MG tablet Take 75 mg by mouth daily.    . Difluprednate (DUREZOL) 0.05 % EMUL Place 1 drop into the left eye 2 (two) times daily.    . fluticasone (FLONASE) 50 MCG/ACT nasal spray Place into both nostrils daily.    Marland Kitchen glucose blood (ONE TOUCH TEST STRIPS) test strip Use as instructed twice daily 200 each 3  . Insulin Glargine (BASAGLAR KWIKPEN) 100 UNIT/ML SOPN Inject 0.06 mLs (6 Units total) into the skin daily. 1 pen 0  . Insulin Pen Needle (BD PEN NEEDLE NANO U/F) 32G  X 4 MM MISC 1 Syringe by Does not apply route daily. 300 each 3  . metFORMIN (GLUCOPHAGE) 1000 MG tablet Take 1 tablet (1,000 mg total) by mouth daily. 90 tablet 1  . Multiple Vitamin (MULTIVITAMIN) tablet Take 1 tablet by mouth daily.    . ONE TOUCH LANCETS MISC Use as directed 2 times per day 200 each 3  . pantoprazole (PROTONIX) 40 MG tablet Take 1 tablet (40 mg total) by mouth daily. 90 tablet 1  . tizanidine (ZANAFLEX) 2 MG capsule Take 1-2 capsules (2-4 mg total) by mouth at bedtime as needed for muscle spasms.     No current facility-administered medications on file prior to visit.     Social History  Substance Use Topics  . Smoking status: Never Smoker  . Smokeless tobacco: Never Used  . Alcohol use No    Review of Systems  Constitutional: Negative for chills and fever.    Respiratory: Negative for cough and shortness of breath.   Cardiovascular: Negative for chest pain, palpitations and leg swelling.  Gastrointestinal: Negative for nausea and vomiting.      Objective:    BP 140/70   Pulse 64   Wt 84 lb (38.1 kg)   SpO2 97%   BMI 16.41 kg/m    Physical Exam  Constitutional: She appears well-developed and well-nourished.  Eyes: Conjunctivae are normal.  Cardiovascular: Normal rate, regular rhythm, normal heart sounds and normal pulses.   Palpable femoral pulses. Sensation intact BLE.  Right AKA. Distal left leg is cold to touch. Extremity of natural skin color.  Pulmonary/Chest: Effort normal and breath sounds normal. She has no wheezes. She has no rhonchi. She has no rales.  Neurological: She is alert.  Skin: Skin is warm and dry.  Psychiatric: She has a normal mood and affect. Her speech is normal and behavior is normal. Thought content normal.  Vitals reviewed.      Assessment & Plan:   1. Left leg pain H/o PVD.  Unable to get ABI done today. Venous US negative for acute clot, shows occlusion of femoral artery and fem-pop bypass graft which was also seen 2015. However her extremity is cold, concern for worsening ischemia in context of acute onset of pain. Advised patient to go to ED for further evalutation. Called and gave triage report. I also spoke with daughter from my personal cell and discussed my concern.   - VAS [place] ABI WITH/WO TBI; Future - CBC with Differential/Platelet - US Venous Img Lower Unilateral Left - [place] ABI W/WO TBI    I am having Ms. Hojnacki maintain her brimonidine-timolol, aspirin, clopidogrel, fluticasone, metFORMIN, carvedilol, atorvastatin, tizanidine, amLODipine, butalbital-aspirin-caffeine, acetaminophen, pantoprazole, Difluprednate, glucose blood, ONE TOUCH LANCETS, multivitamin, Insulin Pen Needle, and BASAGLAR KWIKPEN.   No orders of the defined types were placed in this encounter.   Return precautions  given.   Risks, benefits, and alternatives of the medications and treatment plan prescribed today were discussed, and patient expressed understanding.   Education regarding symptom management and diagnosis given to patient on AVS.  Continue to follow with [name], DO for routine health maintenance.   Tiffany Bailey and I agreed with plan.   [name], FNP

## 2016-05-29 NOTE — ED Provider Notes (Signed)
[facility] Emergency Department Provider Note  ____________________________________________   First MD Initiated Contact with Patient 05/29/16 1805     (approximate)  I have reviewed the triage vital signs and the nursing notes.   HISTORY  Chief Complaint Leg Pain   HPI Tiffany Bailey is a 82 y.o. female with a history of CK D as well as coronary artery disease in the left lower extremity amputation who is presenting with sharp, left lower extremity pain which she describes as "phantom pain." She says that the pain is sharp, 9 out of 10 and feels as if she can feel her leg and foot that are not there. She says that she has had episodes of phantom pain in the past but they've not lasted this long. She was seen as an outpatient earlier today and had an ultrasound which showed arterial disease which appeared unchanged from previous CT scan.   Past Medical History:  Diagnosis Date  . Anemia   . Arthritis   . CKD (chronic kidney disease), stage III   . Coronary artery disease    S/P CABG and multiple PCI's  . Failure to thrive in adult   . GERD (gastroesophageal reflux disease)   . History of breast cancer   . Hyperlipidemia   . Hypertension     Patient Active Problem List   Diagnosis Date Noted  . Uncontrolled type 2 DM with peripheral circulatory disorder ([place]) 03/25/2016  . Anemia 03/25/2016  . Chronic tension-type headache, not intractable 03/25/2016  . Essential hypertension 03/25/2016  . CAD (coronary artery disease) 03/25/2016  . GERD (gastroesophageal reflux disease) 03/25/2016  . Hyperlipidemia 03/25/2016  . History of breast cancer 03/25/2016    Past Surgical History:  Procedure Laterality Date  . ABDOMINAL HYSTERECTOMY    . ABOVE KNEE LEG AMPUTATION Left 2013  . ANTERIOR VITRECTOMY Left 11/16/2015   Procedure: ANTERIOR VITRECTOMY;  Surgeon: Eulogio Bear, MD;  Location: [facility] ORS;  Service: Ophthalmology;  Laterality: Left;  .  BLADDER SURGERY    . CATARACT EXTRACTION W/PHACO Left 11/16/2015   Procedure: CATARACT EXTRACTION PHACO AND INTRAOCULAR LENS PLACEMENT (IOC);  Surgeon: Eulogio Bear, MD;  Location: [facility] ORS;  Service: Ophthalmology;  Laterality: Left;  Lot # [id number] H [place]; 01:22.8 AP%:12.6 CDE: 10.46  . CORONARY ANGIOPLASTY     STENT  . CORONARY ARTERY BYPASS GRAFT    . MASTECTOMY    . TUMOR REMOVAL     ABDOMINAL    Prior to Admission medications   Medication Sig Start Date End Date Taking? Authorizing Provider  acetaminophen (TYLENOL) 500 MG chewable tablet Chew 500 mg by mouth every 6 (six) hours as needed for pain.    Historical Provider, MD  amLODipine (NORVASC) 10 MG tablet Take 1 tablet (10 mg total) by mouth daily. 03/25/16   [name], DO  aspirin 81 MG tablet Take 81 mg by mouth daily.    Historical Provider, MD  atorvastatin (LIPITOR) 40 MG tablet Take 1 tablet (40 mg total) by mouth daily. 03/25/16   Jayce G Cook, DO  brimonidine-timolol (COMBIGAN) 0.2-0.5 % ophthalmic solution Place 1 drop into both eyes every 12 (twelve) hours.    Historical Provider, MD  butalbital-aspirin-caffeine [facility]) 50-325-40 MG capsule Take 1 capsule by mouth every 6 (six) hours as needed for headache.    Historical Provider, MD  carvedilol (COREG) 12.5 MG tablet Take 1 tablet (12.5 mg total) by mouth 2 (two) times daily with a meal. 03/25/16  [name], DO  clopidogrel (PLAVIX) 75 MG tablet Take 75 mg by mouth daily.    Historical Provider, MD  Difluprednate (DUREZOL) 0.05 % EMUL Place 1 drop into the left eye 2 (two) times daily.    Historical Provider, MD  fluticasone (FLONASE) 50 MCG/ACT nasal spray Place into both nostrils daily.    Historical Provider, MD  glucose blood (ONE TOUCH TEST STRIPS) test strip Use as instructed twice daily 04/15/16   [name], DO  Insulin Glargine (BASAGLAR KWIKPEN) 100 UNIT/ML SOPN Inject 0.06 mLs (6 Units total) into the skin daily. 04/29/16   [name], DO  Insulin  Pen Needle (BD PEN NEEDLE NANO U/F) 32G X 4 MM MISC 1 Syringe by Does not apply route daily. 04/29/16   [name], DO  metFORMIN (GLUCOPHAGE) 1000 MG tablet Take 1 tablet (1,000 mg total) by mouth daily. 03/25/16   [name], DO  Multiple Vitamin (MULTIVITAMIN) tablet Take 1 tablet by mouth daily.    Historical Provider, MD  ONE TOUCH LANCETS MISC Use as directed 2 times per day 04/15/16   [name], DO  pantoprazole (PROTONIX) 40 MG tablet Take 1 tablet (40 mg total) by mouth daily. 04/10/16   [name], DO  tizanidine (ZANAFLEX) 2 MG capsule Take 1-2 capsules (2-4 mg total) by mouth at bedtime as needed for muscle spasms. 03/25/16   [name], DO    Allergies Amoxicillin; Gabapentin; and Other  Family History  Problem Relation Age of Onset  . Sudden death Mother   . Arthritis Father   . Stroke Father   . Breast cancer Sister   . Diabetes Grandchild     Social History Social History  Substance Use Topics  . Smoking status: Never Smoker  . Smokeless tobacco: Never Used  . Alcohol use No    Review of Systems Constitutional: No fever/chills Eyes: No visual changes. ENT: No sore throat. Cardiovascular: Denies chest pain. Respiratory: Denies shortness of breath. Gastrointestinal: No abdominal pain.  No nausea, no vomiting.  No diarrhea.  No constipation. Genitourinary: Negative for dysuria. Musculoskeletal: Negative for back pain. Skin: Negative for rash. Neurological: Negative for headaches, focal weakness or numbness.  10-point ROS otherwise negative.  ____________________________________________   PHYSICAL EXAM:  VITAL SIGNS: ED Triage Vitals   Enc Vitals Group     BP (!) 219/72     Pulse Rate 75     Resp 18     Temp 97.5 F (36.4 C)     Temp Source Oral     SpO2 100 %     Weight 86 lb (39 kg)     Height 5' (1.524 m)     Head Circumference      Peak Flow      Pain Score 10     Pain Loc      Pain Edu?      Excl. in [place]?     Constitutional:  Alert and oriented. Holding her left leg stump. Appears uncomfortable. Eyes: Conjunctivae are normal. PERRL. EOMI. Head: Atraumatic. Nose: No congestion/rhinnorhea. Mouth/Throat: Mucous membranes are moist.  Neck: No stridor.   Cardiovascular: Normal rate, regular rhythm. Grossly normal heart sounds.   Respiratory: Normal respiratory effort.  No retractions. Lungs CTAB. Gastrointestinal: Soft and nontender. No distention.  Musculoskeletal: No lower extremity tenderness nor edema.  No joint effusions. No stump breakdown in the left lower extremity. No erythema, induration or pus. Neurologic:  Normal speech and language.  No gross focal neurologic deficits are appreciated.  Skin:  Skin is warm, dry and intact. No rash noted. Psychiatric: Mood and affect are normal. Speech and behavior are normal.  ____________________________________________   LABS (all labs ordered are listed, but only abnormal results are displayed)  Labs Reviewed - No data to display ____________________________________________  EKG   ____________________________________________  RADIOLOGY  [place] (Accession [id number]) (Order [id number])  Imaging  Date: 05/29/2016 Department: [facility] Released By: Cephus Shelling Deal, RMA Authorizing: [name], FNP  Exam Information   Status Exam Begun  Exam Ended   Final [99] 05/29/2016 3:42 PM 05/29/2016 4:25 PM  PACS Images   Show images for [place] Lower Ext Art Left Ltd  Study Result   CLINICAL DATA:  83 year old female with a history of left leg pain.  Prior above knee amputation 2013  EXAM: UNILATERAL LEFT LOWER EXTREMITY ARTERIAL DUPLEX SCAN  TECHNIQUE: Directed duplex of the left lower extremity performed.  COMPARISON:  CT 11/04/2014.  No prior noninvasive vascular study  FINDINGS: Directed duplex of the left lower extremity demonstrates patent left common femoral artery with monophasic  waveform.  The proximal bypass graft is occluded, and was occluded on the prior CT.  Superficial femoral artery is occluded proximally, which was also occluded on the prior CT 11/04/2014. No flow through the SFA.  Deep femoral artery is patent at the origin with monophasic flow.  IMPRESSION: Directed duplex demonstrates occlusion of the superficial femoral artery and fem-pop bypass graft. Note that both of these were occluded on comparison CT 11/04/2014.  Deep femoral artery remains patent with monophasic waveform.  Common femoral artery patent with monophasic waveform, indicating significant iliac/ aortoiliac disease.  Signed,  Dulcy Fanny. Earleen Newport, DO  [facility]  [facility]   Electronically Signed   By: Corrie Mckusick D.O.   On: 05/29/2016 17:33     ____________________________________________   PROCEDURES  Procedure(s) performed:   Procedures  Critical Care performed:   ____________________________________________   INITIAL IMPRESSION / ASSESSMENT AND PLAN / ED COURSE  Pertinent labs & imaging results that were available during my care of the patient were reviewed by me and considered in my medical decision making (see chart for details).  Patient with reassuring hematology workup from earlier today.  Clinical Course   ----------------------------------------- 7:09 PM on 05/29/2016 -----------------------------------------  Patient with pain relieved after morphine. Plan discharged home. The extremity is warm to touch. Good color. I asked her about the cold temperature of the extremity and she says that it has been that way since she had the amputation done. She has a Hydrographic surveyor at [place]. Reassuring ultrasound. Reassuring exam and patient feels better with pain control. We'll plan to discharge home. Discussed with the patient as well as family and they're understanding and went to  comply. Will be following up with her vascular surgeon at the [facility].  ____________________________________________   FINAL CLINICAL IMPRESSION(S) / ED DIAGNOSES  Phantom limb pain.    NEW MEDICATIONS STARTED DURING THIS VISIT:  New Prescriptions   No medications on file     Note:  This document was prepared using Dragon voice recognition software and may include unintentional dictation errors.    Orbie Pyo, MD 05/29/16 [phone]

## 2016-05-31 ENCOUNTER — Telehealth: Payer: Self-pay | Admitting: Family Medicine

## 2016-05-31 NOTE — Telephone Encounter (Signed)
Tried to call patient, was unable to leave VM.

## 2016-05-31 NOTE — Telephone Encounter (Signed)
Please call patient

## 2016-05-31 NOTE — Telephone Encounter (Signed)
Please advise 

## 2016-05-31 NOTE — Telephone Encounter (Signed)
Pt called and stated that she took her blood sugar this morning after she ate it was 111. She wanted to know if she needed to know if she needed to take her insulin. Please advise, thank you!  Call pt @ [phone]

## 2016-05-31 NOTE — Telephone Encounter (Signed)
Continue insulin as prescribed.

## 2016-06-06 NOTE — Telephone Encounter (Signed)
Closing note until patient returns call. 

## 2016-06-06 NOTE — Telephone Encounter (Signed)
Sabrina from [facility] called and stated that they can do the ABI for this pt but they do need another order entered as well. Please enter order  [id number]

## 2016-06-06 NOTE — Telephone Encounter (Signed)
Patient has been informed.

## 2016-06-06 NOTE — Telephone Encounter (Signed)
She is going to f/u with her vascular surgeon and has been seen in the ER.   We don't need to order at this time  Tiffany Bailey -Please call pt and remind her she needs to follow up with vascular surgeon

## 2016-06-07 ENCOUNTER — Other Ambulatory Visit: Payer: Self-pay | Admitting: Internal Medicine

## 2016-06-07 ENCOUNTER — Telehealth: Payer: Self-pay | Admitting: *Deleted

## 2016-06-07 NOTE — Telephone Encounter (Signed)
Need more information.  Is this to be done stat, immediately and what is the test for.  (leg pain?)

## 2016-06-07 NOTE — Telephone Encounter (Signed)
[facility]  has requested to have a ultrasound order changed to  Order # [id number]  Contact [phone]

## 2016-06-07 NOTE — Telephone Encounter (Signed)
Orders are needing to be changed.

## 2016-06-11 NOTE — Telephone Encounter (Signed)
Left message for Tiffany Bailey an [facility] to return call to office ASAP.

## 2016-06-12 ENCOUNTER — Other Ambulatory Visit: Payer: Self-pay | Admitting: Family Medicine

## 2016-06-12 NOTE — Telephone Encounter (Signed)
A voicemail was left for Tiffany Bailey this is the second one left.

## 2016-06-12 NOTE — Telephone Encounter (Signed)
She just needs to follow up with her vascular surgeon (she was seen in the ED for this).

## 2016-06-19 NOTE — Telephone Encounter (Signed)
Tiffany Bailey called back and was asking about order. Per provider and [name] the Doppler done in ED were unchanged. The pt was told to follow up with vascular. [name] asked for the order to be canceled to remove it from their [place].

## 2016-06-23 NOTE — Telephone Encounter (Addendum)
Tiffany Bailey,   Are you able to cancel an order in the que?   See below.   I can see the order for  [place] ABI in epic however I am unable to cancel it either.Marland KitchenMarland Kitchen

## 2016-06-27 ENCOUNTER — Telehealth: Payer: Self-pay | Admitting: Family Medicine

## 2016-06-27 NOTE — Telephone Encounter (Signed)
Called patient back she stated she thought she had called eye doctor.  She needed number to Dr Edison Pace office. Gave him number and she was going to contact his office.

## 2016-06-27 NOTE — Telephone Encounter (Signed)
Pt called and stated that she is having eye pain and tearing in her left eye. She had cataract surgery in June. Please advise, thank you!  Call pt @ [phone]

## 2016-07-08 NOTE — Telephone Encounter (Signed)
Hey there, did you see prior message?

## 2016-07-09 NOTE — Telephone Encounter (Signed)
Yes, I have cancelled the order.

## 2016-08-07 ENCOUNTER — Encounter: Payer: Self-pay | Admitting: *Deleted

## 2016-08-14 ENCOUNTER — Ambulatory Visit (INDEPENDENT_AMBULATORY_CARE_PROVIDER_SITE_OTHER): Payer: Medicare Other | Admitting: Family Medicine

## 2016-08-14 ENCOUNTER — Encounter: Payer: Self-pay | Admitting: Family Medicine

## 2016-08-14 VITALS — BP 193/70 | HR 60 | Temp 97.5°F | Wt 82.6 lb

## 2016-08-14 DIAGNOSIS — I2510 Atherosclerotic heart disease of native coronary artery without angina pectoris: Secondary | ICD-10-CM | POA: Diagnosis not present

## 2016-08-14 DIAGNOSIS — E785 Hyperlipidemia, unspecified: Secondary | ICD-10-CM | POA: Diagnosis not present

## 2016-08-14 DIAGNOSIS — N183 Chronic kidney disease, stage 3 unspecified: Secondary | ICD-10-CM | POA: Insufficient documentation

## 2016-08-14 DIAGNOSIS — I739 Peripheral vascular disease, unspecified: Secondary | ICD-10-CM | POA: Diagnosis not present

## 2016-08-14 DIAGNOSIS — D649 Anemia, unspecified: Secondary | ICD-10-CM | POA: Diagnosis not present

## 2016-08-14 DIAGNOSIS — E1151 Type 2 diabetes mellitus with diabetic peripheral angiopathy without gangrene: Secondary | ICD-10-CM

## 2016-08-14 DIAGNOSIS — E1122 Type 2 diabetes mellitus with diabetic chronic kidney disease: Secondary | ICD-10-CM | POA: Diagnosis not present

## 2016-08-14 DIAGNOSIS — E1165 Type 2 diabetes mellitus with hyperglycemia: Secondary | ICD-10-CM | POA: Diagnosis not present

## 2016-08-14 DIAGNOSIS — IMO0002 Reserved for concepts with insufficient information to code with codable children: Secondary | ICD-10-CM

## 2016-08-14 DIAGNOSIS — I10 Essential (primary) hypertension: Secondary | ICD-10-CM | POA: Diagnosis not present

## 2016-08-14 LAB — CBC
HCT: 32.3 % — ABNORMAL LOW (ref 36.0–46.0)
Hemoglobin: 10.8 g/dL — ABNORMAL LOW (ref 12.0–15.0)
MCHC: 33.5 g/dL (ref 30.0–36.0)
MCV: 89.4 fl (ref 78.0–100.0)
Platelets: 257 10*3/uL (ref 150.0–400.0)
RBC: 3.61 Mil/uL — ABNORMAL LOW (ref 3.87–5.11)
RDW: 14.5 % (ref 11.5–15.5)
WBC: 7.2 10*3/uL (ref 4.0–10.5)

## 2016-08-14 LAB — COMPREHENSIVE METABOLIC PANEL
ALT: 16 U/L (ref 0–35)
AST: 19 U/L (ref 0–37)
Albumin: 4.1 g/dL (ref 3.5–5.2)
Alkaline Phosphatase: 63 U/L (ref 39–117)
BUN: 17 mg/dL (ref 6–23)
CHLORIDE: 104 meq/L (ref 96–112)
CO2: 24 meq/L (ref 19–32)
Calcium: 9.5 mg/dL (ref 8.4–10.5)
Creatinine, Ser: 0.94 mg/dL (ref 0.40–1.20)
GFR: 72.54 mL/min (ref >60.00)
Glucose, Bld: 137 mg/dL — ABNORMAL HIGH (ref 70–99)
POTASSIUM: 3.8 meq/L (ref 3.5–5.1)
SODIUM: 138 meq/L (ref 135–145)
Total Bilirubin: 0.6 mg/dL (ref 0.2–1.2)
Total Protein: 7.1 g/dL (ref 6.0–8.3)

## 2016-08-14 LAB — LIPID PANEL
CHOLESTEROL: 186 mg/dL (ref 0–200)
HDL: 57.6 mg/dL (ref >39.00)
LDL CALC: 114 mg/dL — AB (ref 0–99)
NONHDL: 128.03
Total CHOL/HDL Ratio: 3
Triglycerides: 68 mg/dL (ref 0.0–149.0)
VLDL: 13.6 mg/dL (ref 0.0–40.0)

## 2016-08-14 LAB — HEMOGLOBIN A1C: HEMOGLOBIN A1C: 8.5 % — AB (ref 4.6–6.5)

## 2016-08-14 MED ORDER — CLOPIDOGREL BISULFATE 75 MG PO TABS: 75.0000 mg | ORAL_TABLET | Freq: Every day | ORAL | 3 refills | Status: DC

## 2016-08-14 NOTE — Progress Notes (Signed)
Subjective:  Patient ID: Tiffany Bailey, female    DOB: [birth year]  Age: 83 y.o. MRN: [id number]  CC: Follow up  HPI:  83 year old female with Uncontrolled DM-2, PAD, HTN, HLD, CKD, CAD presents for follow up. Complaints/issues are below.  Chest pain  Patient reports intermittent central chest pain.  X 1 month.  Occurs when at rest at night.  Achy.  Last seconds to minutes and resolves spontaneously.  No associated diaphoresis.  No radiation.  Has known vascular disease. Is on Aspirin, Plavix (needs refill), Lipitor, Coreg.  HTN  BP has been well controlled previously (at most recent cardiology appt).  BP severely elevated today.  I don't believe she is compliant with her medication (based amount # of pills in her bottles).  HLD  In need of Lipid panel  Currently on Lipitor.  DM-2  Uncontrolled.  Not taking insulin consistently.  States she's taking Metformin.  R heel pain  Patient reports ongoing pain of her right foot.  Cold, purple at times.  Pain is severe.  Has known severe PAD.  Has not followed with vascular.  Social Hx   Social History   Social History  . Marital status: Divorced    Spouse name: N/A  . Number of children: N/A  . Years of education: N/A   Social History Main Topics  . Smoking status: Never Smoker  . Smokeless tobacco: Never Used  . Alcohol use No  . Drug use: No  . Sexual activity: Not Currently   Other Topics Concern  . None   Social History Narrative  . None    Review of Systems  Constitutional: Negative.   Cardiovascular: Positive for chest pain.  Musculoskeletal:       Foot pain.    Objective:  BP (!) 193/70 (BP Location: Left Arm, Cuff Size: Small)   Pulse 60   Temp 97.5 F (36.4 C) (Oral)   Wt 82 lb 9.6 oz (37.5 kg)   SpO2 97%   BMI 16.13 kg/m   BP/Weight 08/14/2016 08/07/2016 [id number]  Systolic BP 0000000 - [id number]  Diastolic BP 70 - 72  Wt. (Lbs) 82.6 81 86  BMI 16.13 - 16.8     Physical Exam  Constitutional:  Thin, elderly female in NAD. Resting comfortably in wheelchair.  Cardiovascular: Normal rate and regular rhythm.   Absent pulses in R leg.  Pulmonary/Chest: Effort normal and breath sounds normal.  Neurological: She is alert. Coordination abnormal.  Psychiatric: She has a normal mood and affect.  Vitals reviewed.  Lab Results  Component Value Date   WBC 7.2 08/14/2016   HGB 10.8 (L) 08/14/2016   HCT 32.3 (L) 08/14/2016   PLT 257.0 08/14/2016   GLUCOSE 137 (H) 08/14/2016   CHOL 186 08/14/2016   TRIG 68.0 08/14/2016   HDL 57.60 08/14/2016   LDLCALC 114 (H) 08/14/2016   ALT 16 08/14/2016   AST 19 08/14/2016   NA 138 08/14/2016   K 3.8 08/14/2016   CL 104 08/14/2016   CREATININE 0.94 08/14/2016   BUN 17 08/14/2016   CO2 24 08/14/2016   HGBA1C 8.5 (H) 08/14/2016    Assessment & Plan:   Problem List Items Addressed This Visit    Uncontrolled type 2 DM with peripheral circulatory disorder ([place])    Uncontrolled.  A1C 8.5 today. Advised compliance with insulin and metformin.      Relevant Orders   Hemoglobin A1c (Completed)   PAD (peripheral artery disease) ([place])  Patients foot pain is likely from PAD. Referring to vascular.      Relevant Orders   Ambulatory referral to Vascular Surgery   Hyperlipidemia    LDL was 114. Needs increase in Lipitor.      Relevant Orders   Lipid panel (Completed)   Essential hypertension    BP severely elevated. Suspect noncompliance. Advised compliance with Coreg and Norvasc.      CKD stage 3 due to type 2 diabetes mellitus (HCC)   Relevant Orders   Comprehensive metabolic panel (Completed)   CAD (coronary artery disease) - Primary    Patient with chronic atypical chest pain per cardiology notes. Reported chest pain is atypical and does not seem cardiac. Advised to call cardiology as soon as possible for appt. Continue aspirin, plavix, lipitor, coreg.      Anemia   Relevant Orders    CBC (Completed)      Meds ordered this encounter  Medications  . clopidogrel (PLAVIX) 75 MG tablet    Sig: Take 1 tablet (75 mg total) by mouth daily.    Dispense:  90 tablet    Refill:  3    Follow-up: 1 month  [place]

## 2016-08-14 NOTE — Assessment & Plan Note (Signed)
Patient with chronic atypical chest pain per cardiology notes. Reported chest pain is atypical and does not seem cardiac. Advised to call cardiology as soon as possible for appt. Continue aspirin, plavix, lipitor, coreg.

## 2016-08-14 NOTE — Progress Notes (Signed)
Pre visit review using our clinic review tool, if applicable. No additional management support is needed unless otherwise documented below in the visit note. 

## 2016-08-14 NOTE — Assessment & Plan Note (Signed)
Uncontrolled.  A1C 8.5 today. Advised compliance with insulin and metformin.

## 2016-08-14 NOTE — Patient Instructions (Signed)
Take your medications AS PRESCRIBED.  Insulin daily.  Call cardiology for an appt.  We will arrange the vascular surgery appt.  Follow up in 1 month.  Take care  Dr. Lacinda Axon

## 2016-08-14 NOTE — Assessment & Plan Note (Signed)
LDL was 114. Needs increase in Lipitor.

## 2016-08-14 NOTE — Assessment & Plan Note (Signed)
Patients foot pain is likely from PAD. Referring to vascular.

## 2016-08-14 NOTE — Assessment & Plan Note (Signed)
BP severely elevated. Suspect noncompliance. Advised compliance with Coreg and Norvasc.

## 2016-08-15 ENCOUNTER — Telehealth: Payer: Self-pay | Admitting: *Deleted

## 2016-08-15 ENCOUNTER — Other Ambulatory Visit (INDEPENDENT_AMBULATORY_CARE_PROVIDER_SITE_OTHER): Payer: Medicare Other

## 2016-08-15 ENCOUNTER — Other Ambulatory Visit: Payer: Self-pay | Admitting: Family Medicine

## 2016-08-15 DIAGNOSIS — D649 Anemia, unspecified: Secondary | ICD-10-CM | POA: Diagnosis not present

## 2016-08-15 LAB — IBC PANEL
IRON: 57 ug/dL (ref 42–145)
SATURATION RATIOS: 19.1 % — AB (ref 20.0–50.0)
Transferrin: 213 mg/dL (ref 212.0–360.0)

## 2016-08-15 LAB — FERRITIN: Ferritin: 143.2 ng/mL (ref 10.0–291.0)

## 2016-08-15 NOTE — Telephone Encounter (Signed)
Labs ordered for add-on testing

## 2016-08-15 NOTE — Telephone Encounter (Signed)
-----   Message from [name], DO sent at 08/14/2016  7:49 PM EST ----- Can they add on Iron studies for me?

## 2016-08-15 NOTE — Telephone Encounter (Signed)
faxed form to [facility] for add-on testing

## 2016-08-22 ENCOUNTER — Encounter
Admission: RE | Disposition: A | Discharge status: Home / Self Care | Payer: Self-pay | Source: Ambulatory Visit | Attending: Ophthalmology

## 2016-08-22 ENCOUNTER — Encounter: Payer: Self-pay | Admitting: *Deleted

## 2016-08-22 ENCOUNTER — Ambulatory Visit: Payer: Medicare Other | Admitting: Registered Nurse

## 2016-08-22 ENCOUNTER — Ambulatory Visit
Admission: RE | Admit: 2016-08-22 | Discharge: 2016-08-22 | Disposition: A | Discharge status: Home / Self Care | Payer: Medicare Other | Source: Ambulatory Visit | Attending: Ophthalmology | Admitting: Ophthalmology

## 2016-08-22 DIAGNOSIS — E1136 Type 2 diabetes mellitus with diabetic cataract: Secondary | ICD-10-CM | POA: Diagnosis present

## 2016-08-22 DIAGNOSIS — I2510 Atherosclerotic heart disease of native coronary artery without angina pectoris: Secondary | ICD-10-CM | POA: Insufficient documentation

## 2016-08-22 DIAGNOSIS — I739 Peripheral vascular disease, unspecified: Secondary | ICD-10-CM | POA: Diagnosis not present

## 2016-08-22 DIAGNOSIS — I252 Old myocardial infarction: Secondary | ICD-10-CM | POA: Insufficient documentation

## 2016-08-22 DIAGNOSIS — Z79899 Other long term (current) drug therapy: Secondary | ICD-10-CM | POA: Insufficient documentation

## 2016-08-22 DIAGNOSIS — K219 Gastro-esophageal reflux disease without esophagitis: Secondary | ICD-10-CM | POA: Diagnosis not present

## 2016-08-22 DIAGNOSIS — I10 Essential (primary) hypertension: Secondary | ICD-10-CM | POA: Diagnosis not present

## 2016-08-22 DIAGNOSIS — R51 Headache: Secondary | ICD-10-CM | POA: Insufficient documentation

## 2016-08-22 DIAGNOSIS — D649 Anemia, unspecified: Secondary | ICD-10-CM | POA: Diagnosis not present

## 2016-08-22 HISTORY — PX: CATARACT EXTRACTION W/PHACO: SHX586

## 2016-08-22 LAB — GLUCOSE, CAPILLARY: GLUCOSE-CAPILLARY: 76 mg/dL (ref 65–99)

## 2016-08-22 SURGERY — PHACOEMULSIFICATION, CATARACT, WITH IOL INSERTION
Anesthesia: Monitor Anesthesia Care | Site: Eye | Laterality: Right | Wound class: Clean

## 2016-08-22 MED ORDER — FLUORESCEIN SODIUM 1 MG OP STRP
ORAL_STRIP | OPHTHALMIC | Status: AC
  Filled 2016-08-22: qty 1

## 2016-08-22 MED ORDER — SODIUM HYALURONATE 23 MG/ML IO SOLN
INTRAOCULAR | Status: DC | PRN
  Administered 2016-08-22: 0.6 mL via INTRAOCULAR

## 2016-08-22 MED ORDER — FLUORESCEIN SODIUM 0.6 MG OP STRP
ORAL_STRIP | OPHTHALMIC | Status: DC | PRN
  Administered 2016-08-22: 1 via OPHTHALMIC

## 2016-08-22 MED ORDER — FENTANYL CITRATE (PF) 100 MCG/2ML IJ SOLN
INTRAMUSCULAR | Status: DC | PRN
  Administered 2016-08-22: 50 ug via INTRAVENOUS

## 2016-08-22 MED ORDER — SODIUM HYALURONATE 10 MG/ML IO SOLN
INTRAOCULAR | Status: AC
  Filled 2016-08-22: qty 0.85

## 2016-08-22 MED ORDER — NA CHONDROIT SULF-NA HYALURON 40-30 MG/ML IO SOLN
INTRAOCULAR | Status: DC | PRN
  Administered 2016-08-22: 0.5 mL via INTRAOCULAR

## 2016-08-22 MED ORDER — BSS IO SOLN
INTRAOCULAR | Status: DC | PRN
  Administered 2016-08-22: 200 mL via INTRAOCULAR

## 2016-08-22 MED ORDER — MOXIFLOXACIN HCL 0.5 % OP SOLN
OPHTHALMIC | Status: DC | PRN
  Administered 2016-08-22: 0.2 mL via OPHTHALMIC

## 2016-08-22 MED ORDER — MIDAZOLAM HCL 2 MG/2ML IJ SOLN
INTRAMUSCULAR | Status: AC
  Filled 2016-08-22: qty 2

## 2016-08-22 MED ORDER — MOXIFLOXACIN HCL 0.5 % OP SOLN: 1.0000 [drp] | OPHTHALMIC | Status: DC | PRN

## 2016-08-22 MED ORDER — ARMC OPHTHALMIC DILATING DROPS
1.0000 "application " | OPHTHALMIC | Status: AC
  Administered 2016-08-22 (×3): 1 via OPHTHALMIC

## 2016-08-22 MED ORDER — ONDANSETRON HCL 4 MG/2ML IJ SOLN
INTRAMUSCULAR | Status: DC | PRN
  Administered 2016-08-22: 4 mg via INTRAVENOUS

## 2016-08-22 MED ORDER — LIDOCAINE HCL (PF) 4 % IJ SOLN
INTRAMUSCULAR | Status: DC | PRN
  Administered 2016-08-22: 4 mL via OPHTHALMIC

## 2016-08-22 MED ORDER — POVIDONE-IODINE 5 % OP SOLN
OPHTHALMIC | Status: AC
  Filled 2016-08-22: qty 30

## 2016-08-22 MED ORDER — NA CHONDROIT SULF-NA HYALURON 40-30 MG/ML IO SOLN
INTRAOCULAR | Status: AC
  Filled 2016-08-22: qty 0.5

## 2016-08-22 MED ORDER — MIDAZOLAM HCL 2 MG/2ML IJ SOLN
INTRAMUSCULAR | Status: DC | PRN
  Administered 2016-08-22: 1 mg via INTRAVENOUS

## 2016-08-22 MED ORDER — SODIUM CHLORIDE 0.9 % IV SOLN
INTRAVENOUS | Status: DC
  Administered 2016-08-22: 08:00:00 via INTRAVENOUS

## 2016-08-22 MED ORDER — FENTANYL CITRATE (PF) 100 MCG/2ML IJ SOLN
INTRAMUSCULAR | Status: AC
  Filled 2016-08-22: qty 2

## 2016-08-22 MED ORDER — SODIUM HYALURONATE 23 MG/ML IO SOLN
INTRAOCULAR | Status: AC
  Filled 2016-08-22: qty 0.6

## 2016-08-22 MED ORDER — EPINEPHRINE PF 1 MG/ML IJ SOLN
INTRAMUSCULAR | Status: AC
  Filled 2016-08-22: qty 1

## 2016-08-22 MED ORDER — SODIUM HYALURONATE 10 MG/ML IO SOLN
INTRAOCULAR | Status: DC | PRN
  Administered 2016-08-22: 0.55 mL via INTRAOCULAR

## 2016-08-22 SURGICAL SUPPLY — 24 items
CANNULA ANT/CHMB 27GA (MISCELLANEOUS) ×4 IMPLANT
CUP MEDICINE 2OZ PLAST GRAD ST (MISCELLANEOUS) ×2 IMPLANT
DISSECTOR HYDRO NUCLEUS 50X22 (MISCELLANEOUS) ×2 IMPLANT
GLOVE BIO SURGEON STRL SZ8 (GLOVE) ×2 IMPLANT
GLOVE BIOGEL M 6.5 STRL (GLOVE) ×2 IMPLANT
GLOVE SURG LX 7.5 STRW (GLOVE) ×1
GLOVE SURG LX STRL 7.5 STRW (GLOVE) ×1 IMPLANT
GOWN STRL REUS W/ TWL LRG LVL3 (GOWN DISPOSABLE) ×2 IMPLANT
GOWN STRL REUS W/TWL LRG LVL3 (GOWN DISPOSABLE) ×2
LENS IOL ACRSF IQ ULTRA 27.5 (Intraocular Lens) ×1 IMPLANT
LENS IOL ACRYSOF IQ 27.5 (Intraocular Lens) ×2 IMPLANT
PACK CATARACT (MISCELLANEOUS) ×2 IMPLANT
PACK CATARACT KING (MISCELLANEOUS) ×2 IMPLANT
PACK EYE AFTER SURG (MISCELLANEOUS) ×2 IMPLANT
SOL BAL SALT 15ML (MISCELLANEOUS) ×2
SOL BSS BAG (MISCELLANEOUS) ×2
SOLUTION BAL SALT 15ML (MISCELLANEOUS) ×1 IMPLANT
SOLUTION BSS BAG (MISCELLANEOUS) ×1 IMPLANT
SUT ETHILON 10 0 CS140 6 (SUTURE) ×2 IMPLANT
SYR 3ML LL SCALE MARK (SYRINGE) ×4 IMPLANT
SYR 5ML LL (SYRINGE) ×2 IMPLANT
SYR TB 1ML 27GX1/2 LL (SYRINGE) ×2 IMPLANT
WATER STERILE IRR 250ML POUR (IV SOLUTION) ×2 IMPLANT
WIPE NON LINTING 3.25X3.25 (MISCELLANEOUS) ×2 IMPLANT

## 2016-08-22 NOTE — Anesthesia Postprocedure Evaluation (Signed)
Anesthesia Post Note  Patient: Tiffany Bailey  Procedure(s) Performed: Procedure(s) (LRB): CATARACT EXTRACTION PHACO AND INTRAOCULAR LENS PLACEMENT (IOC) (Right)  Patient location during evaluation: PACU Anesthesia Type: MAC Level of consciousness: awake and alert Pain management: pain level controlled Vital Signs Assessment: post-procedure vital signs reviewed and stable Respiratory status: spontaneous breathing, nonlabored ventilation, respiratory function stable and patient connected to nasal cannula oxygen Cardiovascular status: stable and blood pressure returned to baseline Anesthetic complications: no     Last Vitals:  Vitals:   08/22/16 1043 08/22/16 1044  BP: (!) 194/74 (!) 194/74  Pulse: 73 87  Resp:  12  Temp: 36.8 C 36.8 C    Last Pain:  Vitals:   08/22/16 1044  TempSrc: Temporal                 Alison Stalling

## 2016-08-22 NOTE — Op Note (Signed)
OPERATIVE NOTE  Tiffany Bailey [id number] 08/22/2016   PREOPERATIVE DIAGNOSIS:  Nuclear sclerotic cataract right eye.  H25.11   POSTOPERATIVE DIAGNOSIS:    Nuclear sclerotic cataract right eye.     PROCEDURE:  Phacoemusification with posterior chamber intraocular lens placement of the right eye   LENS:   Implant Name Type Inv. Item Serial No. Manufacturer Lot No. LRB No. Used  LENS IOL ACRYSOF IQ 27.5 - [id number] Intraocular Lens LENS IOL ACRYSOF IQ 27.5 [id number] ALCON   Right 1       [id number] 27.5 D   ULTRASOUND TIME: 0 minutes 52 seconds.  CDE 4.99   SURGEON:  Benay Pillow, MD, MPH  ANESTHESIOLOGIST: Anesthesiologist: Molli Barrows, MD CRNA: Doreen Salvage, CRNA   ANESTHESIA:  Topical with tetracaine drops augmented with 1% preservative-free intracameral lidocaine.  ESTIMATED BLOOD LOSS: less than 1 mL.   COMPLICATIONS:  None.   DESCRIPTION OF PROCEDURE:  The patient was identified in the holding room and transported to the operating room and placed in the supine position under the operating microscope.  The right eye was identified as the operative eye and it was prepped and draped in the usual sterile ophthalmic fashion.   A 1.0 millimeter clear-corneal paracentesis was made at the 10:30 position. 0.5 ml of preservative-free 1% lidocaine with epinephrine was injected into the anterior chamber.  The anterior chamber was filled with Healon 5 viscoelastic.  A 2.4 millimeter keratome was used to make a near-clear corneal incision at the 8:00 position.  A curvilinear capsulorrhexis was made with a cystotome and capsulorrhexis forceps.  Balanced salt solution was used to hydrodissect and hydrodelineate the nucleus.   Phacoemulsification was then used in stop and chop fashion to remove the lens nucleus and epinucleus.  The remaining cortex was then removed using the irrigation and aspiration handpiece. Healon was then placed into the capsular bag to distend it for lens placement.  A  lens was then injected with the autosert alcon mechanism, but this did not fully enter the eye.  The lens was loaded into a D cartridge and then injected into the eye..  The remaining viscoelastic was aspirated.   Wounds were hydrated with balanced salt solution.  The anterior chamber was inflated to a physiologic pressure with balanced salt solution.  There was a wound leak, so a 10-0 nylon suture was placed in the temporal wound.  Intracameral vigamox 0.1 mL undiluted was injected into the eye and a drop placed onto the ocular surface.  No wound leaks were noted.  The patient was taken to the recovery room in stable condition without complications of anesthesia or surgery  Benay Pillow 08/22/2016, 10:40 AM

## 2016-08-22 NOTE — Transfer of Care (Signed)
Immediate Anesthesia Transfer of Care Note  Patient: Tiffany Bailey  Procedure(s) Performed: Procedure(s) with comments: CATARACT EXTRACTION PHACO AND INTRAOCULAR LENS PLACEMENT (IOC) (Right) - Lot # [id number] H [place]: 00:52.1 AP%:8.7 CDE: 4.99   Patient Location: PHASE II  Anesthesia Type:MAC  Level of Consciousness: Awake, Alert, Oriented  Airway & Oxygen Therapy: Patient Spontanous Breathing and Patient on room air   Post-op Assessment: Report given to RN and Post -op Vital signs reviewed and stable  Post vital signs: Reviewed and stable  Last Vitals:  Vitals:   08/22/16 0752 08/22/16 1044  BP: (!) 180/68 (!) 194/74  Pulse: 68 87  Resp: 18 12  Temp: 37 C 38.7 C    Complications: No apparent anesthesia complications

## 2016-08-22 NOTE — Anesthesia Post-op Follow-up Note (Cosign Needed)
Anesthesia QCDR form completed.        

## 2016-08-22 NOTE — Discharge Instructions (Signed)
Eye Surgery Discharge Instructions  Expect mild scratchy sensation or mild soreness. DO NOT RUB YOUR EYE!  The day of surgery:  Minimal physical activity, but bed rest is not required  No reading, computer work, or close hand work  No bending, lifting, or straining.  May watch TV  For 24 hours:  No driving, legal decisions, or alcoholic beverages  Safety precautions  Eat anything you prefer: It is better to start with liquids, then soup then solid foods.  _____ Eye patch should be worn until postoperative exam tomorrow.  ____ Solar shield eyeglasses should be worn for comfort in the sunlight/patch while sleeping  Resume all regular medications including aspirin or Coumadin if these were discontinued prior to surgery. You may shower, bathe, shave, or wash your hair. Tylenol may be taken for mild discomfort.  Call your doctor if you experience significant pain, nausea, or vomiting, fever > 101 or other signs of infection. [phone] or [phone] Specific instructions:  Follow-up Information    Benay Pillow, MD Follow up.   Specialty:  Ophthalmology Why:  March 9 at 10:15am Contact information: [address] [place] [address] [phone]

## 2016-08-22 NOTE — Anesthesia Preprocedure Evaluation (Signed)
Anesthesia Evaluation  Patient identified by MRN, date of birth, ID band Patient awake    Reviewed: Allergy & Precautions, H&P , NPO status , Patient's Chart, lab work & pertinent test results, reviewed documented beta blocker date and time   Airway Mallampati: II  TM Distance: >3 FB Neck ROM: full    Dental no notable dental hx. (+) Teeth Intact   Pulmonary neg pulmonary ROS,    Pulmonary exam normal breath sounds clear to auscultation       Cardiovascular Exercise Tolerance: Good hypertension, + angina with exertion + CAD, + Past MI and + Peripheral Vascular Disease  negative cardio ROS   Rhythm:regular Rate:Normal     Neuro/Psych  Headaches, negative neurological ROS  negative psych ROS   GI/Hepatic negative GI ROS, Neg liver ROS, GERD  Medicated,  Endo/Other  negative endocrine ROSdiabetes  Renal/GU Renal disease     Musculoskeletal   Abdominal   Peds  Hematology negative hematology ROS (+) anemia ,   Anesthesia Other Findings   Reproductive/Obstetrics negative OB ROS                             Anesthesia Physical Anesthesia Plan  ASA: IV  Anesthesia Plan: MAC   Post-op Pain Management:    Induction:   Airway Management Planned:   Additional Equipment:   Intra-op Plan:   Post-operative Plan:   Informed Consent: I have reviewed the patients History and Physical, chart, labs and discussed the procedure including the risks, benefits and alternatives for the proposed anesthesia with the patient or authorized representative who has indicated his/her understanding and acceptance.     Plan Discussed with: CRNA  Anesthesia Plan Comments:         Anesthesia Quick Evaluation

## 2016-08-22 NOTE — H&P (Signed)
The History and Physical notes are on paper, have been signed, and are to be scanned.   I have examined the patient and there are no changes to the H&P.   Benay Pillow 08/22/2016 9:42 AM

## 2016-08-22 NOTE — Anesthesia Procedure Notes (Signed)
Procedure Name: MAC Date/Time: 08/22/2016 9:55 AM Performed by: Doreen Salvage Pre-anesthesia Checklist: Patient identified, Emergency Drugs available, Suction available and Patient being monitored Patient Re-evaluated:Patient Re-evaluated prior to inductionOxygen Delivery Method: Nasal cannula

## 2016-08-26 ENCOUNTER — Ambulatory Visit: Payer: Medicare Other | Admitting: Pharmacist

## 2016-09-03 ENCOUNTER — Encounter (INDEPENDENT_AMBULATORY_CARE_PROVIDER_SITE_OTHER): Payer: Medicare Other | Admitting: Vascular Surgery

## 2016-09-09 ENCOUNTER — Ambulatory Visit: Payer: Medicare Other | Admitting: Pharmacist

## 2016-09-11 ENCOUNTER — Ambulatory Visit (INDEPENDENT_AMBULATORY_CARE_PROVIDER_SITE_OTHER): Payer: Medicare Other | Admitting: Family Medicine

## 2016-09-11 ENCOUNTER — Encounter: Payer: Self-pay | Admitting: Family Medicine

## 2016-09-11 VITALS — BP 132/80 | HR 61 | Temp 97.8°F | Wt 73.1 lb

## 2016-09-11 DIAGNOSIS — I10 Essential (primary) hypertension: Secondary | ICD-10-CM

## 2016-09-11 DIAGNOSIS — E1151 Type 2 diabetes mellitus with diabetic peripheral angiopathy without gangrene: Secondary | ICD-10-CM

## 2016-09-11 DIAGNOSIS — R634 Abnormal weight loss: Secondary | ICD-10-CM

## 2016-09-11 DIAGNOSIS — Z23 Encounter for immunization: Secondary | ICD-10-CM | POA: Diagnosis not present

## 2016-09-11 DIAGNOSIS — E1165 Type 2 diabetes mellitus with hyperglycemia: Secondary | ICD-10-CM | POA: Diagnosis not present

## 2016-09-11 DIAGNOSIS — IMO0002 Reserved for concepts with insufficient information to code with codable children: Secondary | ICD-10-CM

## 2016-09-11 LAB — MICROALBUMIN / CREATININE URINE RATIO
Creatinine,U: 58.2 mg/dL
MICROALB/CREAT RATIO: 3.6 mg/g (ref 0.0–30.0)
Microalb, Ur: 2.1 mg/dL — ABNORMAL HIGH (ref 0.0–1.9)

## 2016-09-11 MED ORDER — MIRTAZAPINE 15 MG PO TBDP: 15.0000 mg | ORAL_TABLET | Freq: Every day | ORAL | 1 refills | Status: DC

## 2016-09-11 NOTE — Progress Notes (Signed)
Pre visit review using our clinic review tool, if applicable. No additional management support is needed unless otherwise documented below in the visit note. 

## 2016-09-11 NOTE — Patient Instructions (Addendum)
Continue your medications.  I have restarted the medication for you appetite.  Follow up in 3 months.  Take care  Dr. Lacinda Axon

## 2016-09-12 DIAGNOSIS — R634 Abnormal weight loss: Secondary | ICD-10-CM | POA: Insufficient documentation

## 2016-09-12 NOTE — Assessment & Plan Note (Signed)
At goal. Continue norvasc and Coreg.

## 2016-09-12 NOTE — Assessment & Plan Note (Signed)
Starting on Remeron.

## 2016-09-12 NOTE — Progress Notes (Signed)
Subjective:  Patient ID: Tiffany Bailey, female    DOB: [birth year]  Age: 83 y.o. MRN: [id number]  CC: Follow up  HPI:  83 year old female with an extensive past medical history including anemia, CAD, CKD, hypertension, hyperlipidemia, PAD, uncontrolled DM 2 presents for follow-up.  HTN  Patient's previous blood pressures have been markedly elevated.  Her blood pressures well controlled today.  She is compliant with amlodipine and carvedilol per her report.  DM-2  Remains uncontrolled.  This is a very difficult case as patient is elderly and very frail.  She eats very little which also complicates this picture and is not particular compliant.  She states that her sugars are uncontrolled. Ranging from the 90s to the 260s.  She is in need of a foot exam and in need of urine microalbumin.  Of note, she is not on ACE inhibitor secondary to poor by mouth intake, age, and noncompliance.  Weight loss  Patient has lost 9 pounds since her last visit.  Patient states that she has very little appetite.  She states that often when she eats she is nauseated.  She was previously on Remeron and would like to start back on this again.  Social Hx   Social History   Social History  . Marital status: Divorced    Spouse name: N/A  . Number of children: N/A  . Years of education: N/A   Social History Main Topics  . Smoking status: Never Smoker  . Smokeless tobacco: Never Used  . Alcohol use No  . Drug use: No  . Sexual activity: Not Currently   Other Topics Concern  . None   Social History Narrative  . None    Review of Systems  Constitutional: Positive for unexpected weight change.  Cardiovascular: Negative.    Objective:  BP 132/80   Pulse 61   Temp 97.8 F (36.6 C) (Oral)   Wt 73 lb 2 oz (33.2 kg)   SpO2 97%   BMI 14.28 kg/m   BP/Weight 09/11/2016 08/22/2016 6/65/9935  Systolic BP 701 779 390  Diastolic BP 80 74 70  Wt. (Lbs) 73.13 82 82.6  BMI 14.28  16.01 16.13    Physical Exam  Constitutional:  Frail elderly female in no acute distress.  Cardiovascular: Normal rate and regular rhythm.   Pulmonary/Chest: Effort normal and breath sounds normal.  Abdominal: Soft. She exhibits no distension. There is no tenderness.  Neurological: She is alert.  Vitals reviewed.   Lab Results  Component Value Date   WBC 7.2 08/14/2016   HGB 10.8 (L) 08/14/2016   HCT 32.3 (L) 08/14/2016   PLT 257.0 08/14/2016   GLUCOSE 137 (H) 08/14/2016   CHOL 186 08/14/2016   TRIG 68.0 08/14/2016   HDL 57.60 08/14/2016   LDLCALC 114 (H) 08/14/2016   ALT 16 08/14/2016   AST 19 08/14/2016   NA 138 08/14/2016   K 3.8 08/14/2016   CL 104 08/14/2016   CREATININE 0.94 08/14/2016   BUN 17 08/14/2016   CO2 24 08/14/2016   HGBA1C 8.5 (H) 08/14/2016   MICROALBUR 2.1 (H) 09/11/2016    Assessment & Plan:   Problem List Items Addressed This Visit    Essential hypertension - Primary    At goal. Continue norvasc and Coreg.      Uncontrolled type 2 DM with peripheral circulatory disorder (HCC)    Uncontrolled. Patient to see our pharmacist. Continue Metformin and Insulin.      Relevant Orders   Microalbumin /  creatinine urine ratio (Completed)   Weight loss    Starting on Remeron.       Other Visit Diagnoses    Need for 23-polyvalent pneumococcal polysaccharide vaccine       Relevant Orders   Pneumococcal polysaccharide vaccine 23-valent greater than or equal to 2yo subcutaneous/IM (Completed)      Meds ordered this encounter  Medications  . mirtazapine (REMERON SOLTAB) 15 MG disintegrating tablet    Sig: Take 1 tablet (15 mg total) by mouth at bedtime.    Dispense:  90 tablet    Refill:  1     Follow-up: Return in about 3 months (around 12/12/2016).  [place]

## 2016-09-12 NOTE — Assessment & Plan Note (Signed)
Uncontrolled. Patient to see our pharmacist. Continue Metformin and Insulin.

## 2016-10-23 ENCOUNTER — Telehealth: Payer: Self-pay | Admitting: Family Medicine

## 2016-10-23 MED ORDER — PANTOPRAZOLE SODIUM 40 MG PO TBEC: 40.0000 mg | DELAYED_RELEASE_TABLET | Freq: Every day | ORAL | 2 refills | Status: DC

## 2016-10-23 NOTE — Telephone Encounter (Signed)
Refill sent.

## 2016-10-23 NOTE — Telephone Encounter (Signed)
Pt called requesting a refill on her pantoprazole (PROTONIX) 40 MG tablet. Please advise, thank you!  [place] [place]  Call pt @ [phone]

## 2016-12-11 ENCOUNTER — Telehealth: Payer: Self-pay | Admitting: *Deleted

## 2016-12-11 ENCOUNTER — Telehealth: Payer: Self-pay | Admitting: Family Medicine

## 2016-12-11 DIAGNOSIS — E1165 Type 2 diabetes mellitus with hyperglycemia: Secondary | ICD-10-CM

## 2016-12-11 DIAGNOSIS — E1151 Type 2 diabetes mellitus with diabetic peripheral angiopathy without gangrene: Secondary | ICD-10-CM

## 2016-12-11 DIAGNOSIS — IMO0002 Reserved for concepts with insufficient information to code with codable children: Secondary | ICD-10-CM

## 2016-12-11 NOTE — Telephone Encounter (Signed)
Pt's son dropped off FL2 and personal care forms to be completed by Dr. Lacinda Axon.

## 2016-12-11 NOTE — Telephone Encounter (Signed)
Patient stated that she will need a FL[facility]. Fax [phone] Patient reported that the medication prescribed to increase her appetite makes her mouth dry. Patient stated that her amputated leg is giving her a lot of pain.  Pt contact  [phone]

## 2016-12-11 NOTE — Telephone Encounter (Signed)
Can I go ahead and complete mediations and problem list ?

## 2016-12-11 NOTE — Telephone Encounter (Signed)
We can get a FL 2 started, but will need information from the Patient via the phone, can you call and triage the rest of the call? thanks

## 2016-12-11 NOTE — Telephone Encounter (Signed)
FYI  Spoke to patient, she stated that her leg is becoming very ice cold, aches, goes numb, and other times like knives are being stuck in her leg. No swelling or discoloration.  Patient does have chronic pain in leg but she stated nothing like this before.  She also stated that she needed to put a heating pad on her amputated leg to warm it up and semi help with the pain.  Instructed patient to go to ED/UC due to [place] not having any available appointments. Patient stated she will go to ED.

## 2016-12-12 NOTE — Telephone Encounter (Signed)
Forms placed in folder for completion.  

## 2016-12-13 NOTE — Telephone Encounter (Signed)
Done

## 2016-12-13 NOTE — Telephone Encounter (Signed)
[name] advised FL2 completed ready for pick up, placed up front for pick up

## 2016-12-25 ENCOUNTER — Ambulatory Visit (INDEPENDENT_AMBULATORY_CARE_PROVIDER_SITE_OTHER): Payer: Medicare Other | Admitting: Family Medicine

## 2016-12-25 DIAGNOSIS — K219 Gastro-esophageal reflux disease without esophagitis: Secondary | ICD-10-CM

## 2016-12-25 DIAGNOSIS — I10 Essential (primary) hypertension: Secondary | ICD-10-CM

## 2016-12-25 DIAGNOSIS — E1165 Type 2 diabetes mellitus with hyperglycemia: Secondary | ICD-10-CM

## 2016-12-25 DIAGNOSIS — E1151 Type 2 diabetes mellitus with diabetic peripheral angiopathy without gangrene: Secondary | ICD-10-CM

## 2016-12-25 DIAGNOSIS — IMO0002 Reserved for concepts with insufficient information to code with codable children: Secondary | ICD-10-CM

## 2016-12-25 LAB — POCT GLYCOSYLATED HEMOGLOBIN (HGB A1C): Hemoglobin A1C: 8

## 2016-12-25 MED ORDER — PANTOPRAZOLE SODIUM 40 MG PO TBEC: 40.0000 mg | DELAYED_RELEASE_TABLET | Freq: Two times a day (BID) | ORAL | 0 refills | Status: DC

## 2016-12-25 NOTE — Patient Instructions (Addendum)
I have increased your Protonix.  Pick up the medication that was given by [facility].  Follow up with our pharmacist in 1-2 weeks.  Take care  Dr. Lacinda Axon

## 2016-12-26 NOTE — Assessment & Plan Note (Signed)
BP markedly elevated today. I suspect this is due to noncompliance. Patient is to continue her current blood pressure medications. Advised compliance. She will follow-up in 1-2 weeks.

## 2016-12-26 NOTE — Assessment & Plan Note (Signed)
Worsening. Increasing Protonix to 40 mg twice daily.

## 2016-12-26 NOTE — Progress Notes (Signed)
Subjective:  Patient ID: Tiffany Bailey, female    DOB: 04-Feb-1928  Age: 83 y.o. MRN: [id number]  CC: Follow up  HPI:  83 year old female with an extensive past medical history including coronary artery disease, peripheral artery disease, uncontrolled type 2 diabetes, CKD presents for follow-up.  Hypertension  BP markedly elevated today.  Patient has been well controlled previously.  This seems to happen when she's not taking her medication properly. I suspect noncompliance.  She is currently on Coreg and amlodipine.  GERD  Patient reports worsening reflux as of late.  She states that she's taking her Protonix, although it is very likely that she's not.  She reports severe heartburn with associated chest discomfort.   She would like to discuss additional treatment options today.  DM 2  Patient states that her sugars have been in the 200s.   She is very labile and difficult to control due to poor by mouth intake.  She is currently on Basaglar. Needs A1C today.  Social Hx   Social History   Social History  . Marital status: Divorced    Spouse name: N/A  . Number of children: N/A  . Years of education: N/A   Social History Main Topics  . Smoking status: Never Smoker  . Smokeless tobacco: Never Used  . Alcohol use No  . Drug use: No  . Sexual activity: Not Currently   Other Topics Concern  . Not on file   Social History Narrative  . No narrative on file    Review of Systems  Gastrointestinal:       GERD.  Musculoskeletal: Positive for arthralgias and myalgias.   Objective:  BP (!) 204/78 (BP Location: Right Arm, Patient Position: Sitting, Cuff Size: Small)   Pulse 70   Temp 98.5 F (36.9 C) (Oral)   SpO2 97%   BP/Weight 12/25/2016 03/10/2682 09/15/9620  Systolic BP 297 989 211  Diastolic BP 78 80 74  Wt. (Lbs) - 73.13 82  BMI - 14.28 16.01    Physical Exam  Constitutional: She is oriented to person, place, and time.  Frail elderly female in  no acute distress.  Cardiovascular: Normal rate and regular rhythm.   Pulmonary/Chest: Effort normal. She has no wheezes. She has no rales.  Neurological: She is alert and oriented to person, place, and time.  Psychiatric: She has a normal mood and affect.  Vitals reviewed.   Lab Results  Component Value Date   WBC 7.2 08/14/2016   HGB 10.8 (L) 08/14/2016   HCT 32.3 (L) 08/14/2016   PLT 257.0 08/14/2016   GLUCOSE 137 (H) 08/14/2016   CHOL 186 08/14/2016   TRIG 68.0 08/14/2016   HDL 57.60 08/14/2016   LDLCALC 114 (H) 08/14/2016   ALT 16 08/14/2016   AST 19 08/14/2016   NA 138 08/14/2016   K 3.8 08/14/2016   CL 104 08/14/2016   CREATININE 0.94 08/14/2016   BUN 17 08/14/2016   CO2 24 08/14/2016   HGBA1C 8 12/25/2016   MICROALBUR 2.1 (H) 09/11/2016    Assessment & Plan:   Problem List Items Addressed This Visit      Cardiovascular and Mediastinum   Essential hypertension    BP markedly elevated today. I suspect this is due to noncompliance. Patient is to continue her current blood pressure medications. Advised compliance. She will follow-up in 1-2 weeks.      Uncontrolled type 2 DM with peripheral circulatory disorder (HCC) - Primary    A1c 8.5 today.  Recently well-controlled for age and comorbidity. Continue current insulin regimen. Follow-up with pharmacy in 1-2 weeks.      Relevant Orders   POCT glycosylated hemoglobin (Hb A1C) (Completed)     Digestive   GERD (gastroesophageal reflux disease)    Worsening. Increasing Protonix to 40 mg twice daily.      Relevant Medications   pantoprazole (PROTONIX) 40 MG tablet      Meds ordered this encounter  Medications  . DISCONTD: metoprolol tartrate (LOPRESSOR) 50 MG tablet  . pantoprazole (PROTONIX) 40 MG tablet    Sig: Take 1 tablet (40 mg total) by mouth 2 (two) times daily before a meal.    Dispense:  180 tablet    Refill:  0     Follow-up: 1-2 weeks.  [place]

## 2016-12-26 NOTE — Assessment & Plan Note (Signed)
A1c 8.5 today. Recently well-controlled for age and comorbidity. Continue current insulin regimen. Follow-up with pharmacy in 1-2 weeks.

## 2017-01-06 ENCOUNTER — Telehealth: Payer: Self-pay | Admitting: Family Medicine

## 2017-01-06 NOTE — Telephone Encounter (Signed)
Pt dropped off a form to be filled out for her to receive home health care. Placed in Dr.Cooks colored folder upfront. Please advise

## 2017-01-06 NOTE — Telephone Encounter (Signed)
Tiffany Bailey [phone] called from [facility] regarding several drugs that don't have strength they would like to get all of her Rx electronic to [facility]. Thank you!

## 2017-01-06 NOTE — Telephone Encounter (Signed)
Placed in your quick sign folder for signature  

## 2017-01-07 ENCOUNTER — Other Ambulatory Visit: Payer: Self-pay | Admitting: Family Medicine

## 2017-01-07 ENCOUNTER — Other Ambulatory Visit: Payer: Self-pay

## 2017-01-07 DIAGNOSIS — E1151 Type 2 diabetes mellitus with diabetic peripheral angiopathy without gangrene: Secondary | ICD-10-CM

## 2017-01-07 DIAGNOSIS — IMO0002 Reserved for concepts with insufficient information to code with codable children: Secondary | ICD-10-CM

## 2017-01-07 DIAGNOSIS — E1165 Type 2 diabetes mellitus with hyperglycemia: Secondary | ICD-10-CM

## 2017-01-07 NOTE — Telephone Encounter (Signed)
Tried Armed forces technical officer they are closed.

## 2017-01-07 NOTE — Telephone Encounter (Addendum)
Spoke with Jeani Hawking at [facility]  Need  To  clarify if aspirin is chewable or enteric coated. Multiviatmin need order.  Need order for Glucometer and testing strips and how often to test. Need to clarify Basaglar  We have 6 units daily.

## 2017-01-08 MED ORDER — PANTOPRAZOLE SODIUM 40 MG PO TBEC: 40.0000 mg | DELAYED_RELEASE_TABLET | Freq: Two times a day (BID) | ORAL | 0 refills | Status: DC

## 2017-01-08 NOTE — Telephone Encounter (Signed)
[facility] needs clarification on pantaprazole script it looks like increased at last office visit on 12/25/16.  On FL2 was written for 1 qd dosing.  Sending in script for bid dosing.

## 2017-01-09 NOTE — Telephone Encounter (Signed)
Will you clarify her insulin with her (I feel that she has decreased down from 6)? Aspirin is enteric coated. Please find out what meter.

## 2017-01-10 ENCOUNTER — Ambulatory Visit (INDEPENDENT_AMBULATORY_CARE_PROVIDER_SITE_OTHER): Payer: Medicare Other | Admitting: Family Medicine

## 2017-01-10 ENCOUNTER — Encounter: Payer: Self-pay | Admitting: Family Medicine

## 2017-01-10 DIAGNOSIS — I10 Essential (primary) hypertension: Secondary | ICD-10-CM | POA: Diagnosis not present

## 2017-01-10 MED ORDER — LISINOPRIL 2.5 MG PO TABS: 2.5000 mg | ORAL_TABLET | Freq: Every day | ORAL | 0 refills | Status: DC

## 2017-01-10 NOTE — Assessment & Plan Note (Signed)
Uncontrolled. Adding low dose lisinopril today (Creatinine clearance ~ 25). Follow up in 1 week with Pharm D. Continue amlodipine and carvedilol.

## 2017-01-10 NOTE — Progress Notes (Signed)
   Subjective:  Patient ID: Tiffany Bailey, female    DOB: Sep 08, 1927  Age: 83 y.o. MRN: [id number]  CC: Follow up HTN  HPI:  83 year old female with an extensive past medical history presents for follow-up regarding hypertension.  Hypertension  Patient endorses compliance with carvedilol and amlodipine. BP elevated today.  She's had prior blood pressures that have been well controlled.  She reports that she recently had a headache but is otherwise feeling well. No further headaches since that time. That was approximately 1 week ago.  No chest pain or shortness of breath.  She has no other complaints or concerns at this time.  Social Hx   Social History   Social History  . Marital status: Divorced    Spouse name: N/A  . Number of children: N/A  . Years of education: N/A   Social History Main Topics  . Smoking status: Never Smoker  . Smokeless tobacco: Never Used  . Alcohol use No  . Drug use: No  . Sexual activity: Not Currently   Other Topics Concern  . None   Social History Narrative  . None    Review of Systems  Respiratory: Negative.   Cardiovascular: Negative.    Objective:  BP (!) 170/66 (BP Location: Right Arm, Patient Position: Sitting, Cuff Size: Normal)   Pulse 67   Temp 98.8 F (37.1 C) (Oral)   SpO2 97%   BP/Weight 01/10/2017 12/25/2016 9/92/4268  Systolic BP 341 962 229  Diastolic BP 66 78 80  Wt. (Lbs) - - 73.13  BMI - - 14.28    Physical Exam  Constitutional: She is oriented to person, place, and time. She appears well-developed. No distress.  Cardiovascular: Normal rate and regular rhythm.   Pulmonary/Chest: Effort normal and breath sounds normal. She has no wheezes. She has no rales.  Neurological: She is alert and oriented to person, place, and time.  Psychiatric: She has a normal mood and affect.  Vitals reviewed.   Lab Results  Component Value Date   WBC 7.2 08/14/2016   HGB 10.8 (L) 08/14/2016   HCT 32.3 (L) 08/14/2016   PLT 257.0 08/14/2016   GLUCOSE 137 (H) 08/14/2016   CHOL 186 08/14/2016   TRIG 68.0 08/14/2016   HDL 57.60 08/14/2016   LDLCALC 114 (H) 08/14/2016   ALT 16 08/14/2016   AST 19 08/14/2016   NA 138 08/14/2016   K 3.8 08/14/2016   CL 104 08/14/2016   CREATININE 0.94 08/14/2016   BUN 17 08/14/2016   CO2 24 08/14/2016   HGBA1C 8 12/25/2016   MICROALBUR 2.1 (H) 09/11/2016    Assessment & Plan:   Problem List Items Addressed This Visit      Cardiovascular and Mediastinum   Essential hypertension    Uncontrolled. Adding low dose lisinopril today (Creatinine clearance ~ 25). Follow up in 1 week with Pharm D. Continue amlodipine and carvedilol.      Relevant Medications   lisinopril (PRINIVIL,ZESTRIL) 2.5 MG tablet      Meds ordered this encounter  Medications  . lisinopril (PRINIVIL,ZESTRIL) 2.5 MG tablet    Sig: Take 1 tablet (2.5 mg total) by mouth daily.    Dispense:  90 tablet    Refill:  0    Follow-up: 1 week with pharmacist.  [place]

## 2017-01-10 NOTE — Patient Instructions (Signed)
Norvasc, Coreg, and Lisinopril for blood pressure.  Follow up in 1 week with our pharmacist.  Take care  Dr. Lacinda Axon

## 2017-01-13 MED ORDER — BLOOD GLUCOSE MONITOR KIT: PACK | 0 refills | Status: DC

## 2017-01-13 MED ORDER — BLOOD GLUCOSE MONITOR KIT: PACK | 0 refills | Status: AC

## 2017-01-14 ENCOUNTER — Telehealth: Payer: Self-pay | Admitting: Family Medicine

## 2017-01-14 NOTE — Telephone Encounter (Signed)
Pt called requesting that we send a fax of her medications to [place] [place]. Please advise, thank you!

## 2017-01-15 MED ORDER — BASAGLAR KWIKPEN 100 UNIT/ML ~~LOC~~ SOPN: 3.0000 [IU] | PEN_INJECTOR | Freq: Every day | SUBCUTANEOUS | 0 refills | Status: DC

## 2017-01-15 MED ORDER — ASPIRIN EC 81 MG PO TBEC: 81.0000 mg | DELAYED_RELEASE_TABLET | Freq: Every day | ORAL | 5 refills | Status: AC

## 2017-01-15 MED ORDER — ACETAMINOPHEN 500 MG PO TABS: 500.0000 mg | ORAL_TABLET | Freq: Four times a day (QID) | ORAL | 0 refills | Status: DC | PRN

## 2017-01-15 NOTE — Telephone Encounter (Signed)
In the process of working on

## 2017-01-15 NOTE — Telephone Encounter (Signed)
Medication List faxed to [facility]

## 2017-01-15 NOTE — Telephone Encounter (Signed)
Updated med list faxed to [place] along with scripts for Basaglar, aspirin EC .

## 2017-01-20 ENCOUNTER — Ambulatory Visit (INDEPENDENT_AMBULATORY_CARE_PROVIDER_SITE_OTHER): Payer: Medicare Other | Admitting: Pharmacist

## 2017-01-20 ENCOUNTER — Encounter: Payer: Self-pay | Admitting: Pharmacist

## 2017-01-20 VITALS — BP 165/61 | HR 61

## 2017-01-20 DIAGNOSIS — I10 Essential (primary) hypertension: Secondary | ICD-10-CM | POA: Diagnosis not present

## 2017-01-20 MED ORDER — LISINOPRIL 2.5 MG PO TABS: 5.0000 mg | ORAL_TABLET | Freq: Every day | ORAL | 0 refills | Status: DC

## 2017-01-20 NOTE — Progress Notes (Signed)
    S:     Chief Complaint  Patient presents with  . Medication Management    hypertension    Patient arrives in good spirits, ambulating in a wheelchair with her son.  Presents for hypertension evaluation, education, and management at the request of Dr. Lacinda Axon. Patient was referred on 01/10/2017.  Patient was last seen by Primary Care Provider on 01/10/2017. Added lisinopril 2.5 mg daily at last visit.   Family/Social History: Thinking of moving to an assisted living in the near future.    Patient reports adherence to medications, including new start lisinopril Current diabetes medications include: Metformin 1000 mg daily, Basaglar 3 units 1-2x a week Current hypertension medications include: lisinopril 2.5 mg daily, amlodipine 10mg  daily, carvedilol 12.5mg  BID  Patient reported dietary habits: Eats 3  meals/day Breakfast: Raisin Bran, fried sweet potatoes, ham, pancakes, ham biscuit Lunch: fried porkchops, canned or frozen beans and tomatoes, fresh corn  Dinner: [place] takeout Snacks: nabs, banana sandwiches Drinks: Coke  Patient reported exercise habits: none, limited by wheel chair bound   O:  Physical Exam  Constitutional: She appears well-developed and well-nourished.   Last CrCl ~ 48 (normalized), K 3.8 per labs from 08/14/2016  Review of Systems  All other systems reviewed and are negative.  Lab Results  Component Value Date   HGBA1C 8 12/25/2016   Vitals:   01/20/17 1322 01/20/17 1324  BP: (!) 160/67 (!) 165/61  Pulse: 61 61   A/P: Hypertension longstanding currently uncontrolled as evidenced by BP.  Patient reports adherence with medication. Control is suboptimal due to dietary indiscretion and unoptimized medications.  Following discussion and approval by Dr. Lacinda Axon, the following medication changes were made:  -Increase lisinopril 2.5 mg daily -Continue amlodipine 10 mg daily, carvedilol 12.5 mg BID -Counseled to decrease sodium intake to less than 2,000  mg/day -BMET today   Patient was seen with Dr. Lacinda Axon today in clinic and medication changes were discussed and approved prior to initiation  Written patient instructions provided.  Total time in face to face counseling 35 minutes.   Follow up in Pharmacist Clinic Visit 2 weeks.   Patient seen with Shelle Iron, PharmD [place], Pharm.D. PGY2 Ambulatory Care Pharmacy Resident Phone: [phone]

## 2017-01-20 NOTE — Assessment & Plan Note (Signed)
Hypertension longstanding currently uncontrolled as evidenced by BP.  Patient reports adherence with medication. Control is suboptimal due to dietary indiscretion and unoptimized medications.  Following discussion and approval by Dr. Lacinda Axon, the following medication changes were made:  -Increase lisinopril 2.5 mg daily -Continue amlodipine 10 mg daily, carvedilol 12.5 mg BID -Counseled to decrease sodium intake to less than 2,000 mg/day -BMET today

## 2017-01-20 NOTE — Patient Instructions (Addendum)
Thank you for coming to see [place] today!   Increase your lisinopril to 5 mg (two tablets) by mouth daily.   Continue all other meds.   Labs today. We will call you if there are any problems.   Return to see [place] in 2 weeks.

## 2017-01-20 NOTE — Progress Notes (Signed)
Care was provided under my supervision. I agree with the management as indicated in the note.  Fifi Schindler DO  

## 2017-01-21 LAB — BASIC METABOLIC PANEL
BUN: 18 mg/dL (ref 7–25)
CALCIUM: 9.1 mg/dL (ref 8.6–10.4)
CO2: 21 mmol/L (ref 20–32)
CREATININE: 1.14 mg/dL — AB (ref 0.60–0.88)
Chloride: 102 mmol/L (ref 98–110)
Glucose, Bld: 270 mg/dL — ABNORMAL HIGH (ref 65–99)
Potassium: 4.2 mmol/L (ref 3.5–5.3)
Sodium: 136 mmol/L (ref 135–146)

## 2017-02-03 ENCOUNTER — Other Ambulatory Visit: Payer: Self-pay | Admitting: Family Medicine

## 2017-02-03 ENCOUNTER — Encounter: Payer: Self-pay | Admitting: Pharmacist

## 2017-02-03 ENCOUNTER — Ambulatory Visit (INDEPENDENT_AMBULATORY_CARE_PROVIDER_SITE_OTHER): Payer: Medicare Other | Admitting: Pharmacist

## 2017-02-03 ENCOUNTER — Ambulatory Visit: Payer: Medicare Other | Admitting: Family Medicine

## 2017-02-03 VITALS — BP 182/62 | HR 60

## 2017-02-03 DIAGNOSIS — I10 Essential (primary) hypertension: Secondary | ICD-10-CM

## 2017-02-03 LAB — BASIC METABOLIC PANEL
BUN: 19 mg/dL (ref 6–23)
CO2: 23 mEq/L (ref 19–32)
Calcium: 9.2 mg/dL (ref 8.4–10.5)
Chloride: 105 mEq/L (ref 96–112)
Creatinine, Ser: 1.14 mg/dL (ref 0.40–1.20)
GFR: 57.99 mL/min — ABNORMAL LOW (ref >60.00)
Glucose, Bld: 273 mg/dL — ABNORMAL HIGH (ref 70–99)
Potassium: 4.1 mEq/L (ref 3.5–5.1)
Sodium: 137 mEq/L (ref 135–145)

## 2017-02-03 MED ORDER — HYDRALAZINE HCL 10 MG PO TABS: 10.0000 mg | ORAL_TABLET | Freq: Three times a day (TID) | ORAL | 3 refills | Status: DC

## 2017-02-03 NOTE — Progress Notes (Signed)
Care was provided under my supervision. I agree with the management as indicated in the note.  Gregary Blackard DO  

## 2017-02-03 NOTE — Telephone Encounter (Signed)
Saw Chrys Racer 02/03/17 not on [place]  Not on med list when seen on 01/10/17

## 2017-02-03 NOTE — Patient Instructions (Addendum)
Thanks for coming to see [place] today! Here are the medication changes we talked about today:  1. Please start hydralazine 10 mg three times a day.  2. Please take your lisinopril at night rather than the day time.  3. Continue all your other medications.  4. We'll get some bloodwork from you today.  Please come back to see [place] in 2 weeks. You can contact [phone] and ask for Chrys Racer if you have any questions or concerns.

## 2017-02-03 NOTE — Progress Notes (Signed)
    S:    Patient arrives in good spirits, ambulating in a wheelchair, presenting with her daughter, [name]. Presents for hypertension evaluation, education, and management at the request of Dr. Lacinda Axon. Patient was referred on 01/10/2017.  Patient was last seen by Primary Care Provider on 01/10/2017.   Last visit lisinopril was increased from 2.59m to 531mdaily.   Family/Social History: Thinking of moving to an assisted living in the near future.    Patient reports adherence to medications Current diabetes medications include: Metformin 1000 mg daily, Basaglar 3 units 1-2x a week Current hypertension medications include: lisinopril 5 mg daily, amlodipine 1016maily, carvedilol 12.5mg79mD  Patient reported dietary habits: Eats 3 meals/day Breakfast: ham biscuit, hashbrown Lunch: black eyed peas, mac and cheese, fried chicken Dinner: peanut butter, nabs Snacks: nabs Drinks: soda   O:   Last 3 Office BP readings: BP Readings from Last 3 Encounters:  02/03/17 (!) 182/62  01/20/17 (!) 165/61  01/10/17 (!) 170/66    BMET    Component Value Date/Time   NA 136 01/20/2017 1341   K 4.2 01/20/2017 1341   CL 102 01/20/2017 1341   CO2 21 01/20/2017 1341   GLUCOSE 270 (H) 01/20/2017 1341   BUN 18 01/20/2017 1341   CREATININE 1.14 (H) 01/20/2017 1341   CALCIUM 9.1 01/20/2017 1341   GFRNONAA 46 (L) 02/25/2015 1709   GFRAA 54 (L) 02/25/2015 1709   SCr bumped slightly from 0.94 (february, 2018)>1.14 at last visit CrCl ~ 40.2 ml/min however patient is deconditioned and CrCl is likely an overestimation.  A/P: Hypertension longstanding currently uncontrolled as evidenced by BP today.  Patient reports adherence with medication. Control is suboptimal due to dietary indiscretion and unoptimized medications.  Following discussion and approval by Dr. CookLacinda Axone following medication changes were made:  -Start hydralazine 10mg35mee times a day -Continue all other medications, move lisinopril to  nighttime dosing; avoiding increasing lisinopril given CKD3.  -BMET today  Patient was seen with Dr. Cook Lacinda Axony in clinic and medication changes were discussed and approved prior to initiation  Results reviewed and written information provided.   Total time in face-to-face counseling 20 minutes.   F/U Clinic Visit with me in 2 weeks.  Patient seen with HeyseShelle IronrmD Candidate

## 2017-02-03 NOTE — Assessment & Plan Note (Signed)
Hypertension longstanding currently uncontrolled as evidenced by BP today.  Patient reports adherence with medication. Control is suboptimal due to dietary indiscretion and unoptimized medications.  Following discussion and approval by Dr. Lacinda Axon, the following medication changes were made:  -Start hydralazine 10mg  three times a day -Continue all other medications, move lisinopril to nighttime dosing; avoiding increasing lisinopril given CKD3.  -BMET today

## 2017-02-12 ENCOUNTER — Telehealth: Payer: Self-pay | Admitting: *Deleted

## 2017-02-12 NOTE — Telephone Encounter (Signed)
Spoke with patient states she started hydralazine 1 week ago.  Last night she urinated 5 times in 1 hour . Also had diarrhea , had stomach pain and stomach cramps.  Diarrhea has resolved today as well as stomach cramp. Patient is having  phantom pain in left leg since last night and still complaining this today. Please advise .

## 2017-02-12 NOTE — Telephone Encounter (Signed)
Pt requested a call to discuss for hydralazine. Pt has concerns about the number of times she's voiding. Pt contact [phone]

## 2017-02-12 NOTE — Telephone Encounter (Signed)
If symptoms have persisted I would suggest evaluation. This could be at the walk-in clinic or urgent care. If symptoms have resolved she should monitor for recurrence and if they recur she should be evaluated.

## 2017-02-13 NOTE — Telephone Encounter (Addendum)
Patient advised of below apologized to her since she didn't get call back yesterday.  I explained to her I was out of office. Patient states urinary frequency resolved last night.  She states she held hydraalazine  advised patient not to hold medication without  Consulting Md. Stilling having phantom however  she just wants to monitor symptoms for now.  She will call back to schedule appointment for now.  She will go to urgent care if symptoms persist due to she has problems with transportation and daughter can take after she gets off work.

## 2017-02-14 ENCOUNTER — Telehealth: Payer: Self-pay | Admitting: Family Medicine

## 2017-02-14 NOTE — Telephone Encounter (Signed)
Pt called about a fax coming in today from supply equipment company pt not sure of the name regarding a wheelchair and pull ups. It needs to be filled out and faxed back. Please advise?

## 2017-02-14 NOTE — Telephone Encounter (Signed)
FYI

## 2017-02-24 ENCOUNTER — Ambulatory Visit (INDEPENDENT_AMBULATORY_CARE_PROVIDER_SITE_OTHER): Payer: Medicare Other | Admitting: Pharmacist

## 2017-02-24 ENCOUNTER — Encounter: Payer: Self-pay | Admitting: Pharmacist

## 2017-02-24 VITALS — BP 166/66 | HR 59

## 2017-02-24 DIAGNOSIS — I10 Essential (primary) hypertension: Secondary | ICD-10-CM

## 2017-02-24 NOTE — Patient Instructions (Addendum)
Thank you for coming to see [place] today! Your blood pressure is about as good as we can get it. We are limited by the diastolic blood pressure (bottom number) because we don't want to push you too low.   1. Continue all of your medications  Follow up with your Primary Care Doctor as needed. We will see you in the future if needed.

## 2017-02-24 NOTE — Assessment & Plan Note (Signed)
Isolated Systolic hypertension longstanding uncontrolled, but improved on current medications based on BP readings in clinic. Patient largely reports adherence to current medications. Control is suboptimal due to dietary indiscretion and unoptimized medications. Therapeutic options are limited due to CKD stage III, and diastolic borderline hypotension, some degree of suspected white coat HTN given home reading.   Following discussion and approval by Dr. Lacinda Axon, the following medication changes were made:  -Given patient's advanced age and her diastolic readings consistently in the 18s and 60s, will not make any medication changes at this time.  -Continue current antihypertensive regimen.

## 2017-02-24 NOTE — Progress Notes (Signed)
Care was provided under my supervision. I agree with the management as indicated in the note.  Ellah Otte DO  

## 2017-02-24 NOTE — Progress Notes (Signed)
   S:    Patient arrives in good spirits, ambulating with aid of a wheel chair with her son-law, Eddie. Presents to the clinic for hypertension evaluation. Patient was referred on 01/10/2017.  Patient was last seen by Primary Care Provider on 01/10/17 Last seen on 02/03/17 in pharmacy clinic. Hydralazine 10 mg TID was started at that time due to elevated BP in clinic.   Patient reports adherence with medications most of the time, occasionally (~1x/week) misses lunchtime dose of hydralazine when she is at church. Reports that she has not experienced any side effects related to her medications. Denies having any dizziness, lightheadedness, or swelling. States that she checked her BP at home last week after starting hydralazine 10 mg TID and the reading was 140/50s. Using wrist BP cuff.    Current BP Medications include:  Carvedilol 12.5 mg BID, lisinopril 5 mg daily, amlodipine 10 mg daily, hydralazine 10 mg TID.   Dietary habits include:  Eats 3 meals/day Breakfast: ham biscuit, hashbrown Lunch: black eyed peas, mac and cheese, fried chicken Dinner: peanut butter, Nabs Snacks: Nabs Drinks: soda   O:   Last 3 Office BP readings: BP Readings from Last 3 Encounters:  02/24/17 (!) 166/66  02/03/17 (!) 182/62  01/20/17 (!) 165/61    BMET    Component Value Date/Time   NA 137 02/03/2017 1453   K 4.1 02/03/2017 1453   CL 105 02/03/2017 1453   CO2 23 02/03/2017 1453   GLUCOSE 273 (H) 02/03/2017 1453   BUN 19 02/03/2017 1453   CREATININE 1.14 02/03/2017 1453   CREATININE 1.14 (H) 01/20/2017 1341   CALCIUM 9.2 02/03/2017 1453   GFRNONAA 46 (L) 02/25/2015 1709   GFRAA 54 (L) 02/25/2015 1709    A/P: Isolated Systolic hypertension longstanding uncontrolled, but improved on current medications based on BP readings in clinic. Patient largely reports adherence to current medications. Control is suboptimal due to dietary indiscretion and unoptimized medications. Therapeutic options are limited  due to CKD stage III, and diastolic borderline hypotension, some degree of suspected white coat HTN given home reading.   Following discussion and approval by Dr. Lacinda Axon, the following medication changes were made:  -Given patient's advanced age and her diastolic readings consistently in the 11s and 60s, will not make any medication changes at this time.  -Continue current antihypertensive regimen.   Patient was seen with Dr. Lacinda Axon today in clinic and medication changes were discussed and approved prior to initiation  Results reviewed and written information provided.   Total time in face-to-face counseling 20 minutes.    F/U Clinic Visit as needed. Patient seen with Barkley Boards, PharmD Candidate   Carlean Jews, Pharm.D. PGY2 Ambulatory Care Pharmacy Resident Phone: [phone]

## 2017-03-11 ENCOUNTER — Other Ambulatory Visit: Payer: Self-pay | Admitting: Family Medicine

## 2017-03-31 ENCOUNTER — Other Ambulatory Visit: Payer: Self-pay | Admitting: Family Medicine

## 2017-04-01 NOTE — Telephone Encounter (Signed)
Please advise, last OV was with the pharmacist on 02/24/17, last with old PCP was 01/10/17, thanks

## 2017-04-02 ENCOUNTER — Telehealth: Payer: Self-pay | Admitting: Family

## 2017-04-02 NOTE — Telephone Encounter (Signed)
Call pt to confirm remeron dose. May refill for patient,  Please also ensure she has f.u scheduled with me or colleage ASAP due to blood pressure management.

## 2017-04-03 NOTE — Telephone Encounter (Signed)
Follow up appointment appointment scheduled with Joycelyn Schmid on 04/08/17 .

## 2017-04-03 NOTE — Telephone Encounter (Signed)
Advised patient she should have script sent in on 03/31/17 , will call [place] and double check

## 2017-04-08 ENCOUNTER — Encounter: Payer: Self-pay | Admitting: Family

## 2017-04-08 ENCOUNTER — Ambulatory Visit (INDEPENDENT_AMBULATORY_CARE_PROVIDER_SITE_OTHER): Payer: Medicare Other | Admitting: Family

## 2017-04-08 VITALS — BP 155/62 | HR 62 | Temp 98.1°F | Ht 60.0 in | Wt 85.0 lb

## 2017-04-08 DIAGNOSIS — K219 Gastro-esophageal reflux disease without esophagitis: Secondary | ICD-10-CM | POA: Diagnosis not present

## 2017-04-08 DIAGNOSIS — I10 Essential (primary) hypertension: Secondary | ICD-10-CM

## 2017-04-08 DIAGNOSIS — E1129 Type 2 diabetes mellitus with other diabetic kidney complication: Secondary | ICD-10-CM

## 2017-04-08 DIAGNOSIS — Z794 Long term (current) use of insulin: Secondary | ICD-10-CM | POA: Diagnosis not present

## 2017-04-08 LAB — BASIC METABOLIC PANEL
BUN: 20 mg/dL (ref 6–23)
CALCIUM: 9.3 mg/dL (ref 8.4–10.5)
CO2: 25 mEq/L (ref 19–32)
CREATININE: 1.04 mg/dL (ref 0.40–1.20)
Chloride: 105 mEq/L (ref 96–112)
GFR: 64.45 mL/min (ref >60.00)
Glucose, Bld: 167 mg/dL — ABNORMAL HIGH (ref 70–99)
Potassium: 3.9 mEq/L (ref 3.5–5.1)
SODIUM: 138 meq/L (ref 135–145)

## 2017-04-08 LAB — HEMOGLOBIN A1C: HEMOGLOBIN A1C: 8.7 % — AB (ref 4.6–6.5)

## 2017-04-08 NOTE — Patient Instructions (Addendum)
Labs today   BLOOD Pressure log  Monitor blood pressure,  Goal is less than 140/80; if persistently higher, please make sooner follow up appointment so we can recheck you blood pressure and manage medications   Follow up in 2 weeks.

## 2017-04-08 NOTE — Progress Notes (Signed)
Pre visit review using our clinic review tool, if applicable. No additional management support is needed unless otherwise documented below in the visit note. 

## 2017-04-08 NOTE — Progress Notes (Signed)
Subjective:    Patient ID: Tiffany Bailey, female    DOB: 1928/01/28, 83 y.o.   MRN: [id number]  CC: Tiffany Bailey is a 83 y.o. female who presents today for follow up.   HPI: HTN-currently on Norvasc, Coreg, hydralazine, lisinopril.  Denies exertional chest pain or pressure, numbness or tingling radiating to left arm or jaw, palpitations, dizziness, frequent headaches, changes in vision, or shortness of breath.   DM-takes glargine in the mornings if FGB was over 115.reports FBG typically 120. Given herself glargine most days. Eats breakfast and will hold glargine, if not eating breakfast.  No hypoglyemic episodes. On metformin as well  GERD- takes protonix everyday with control. No epigastric pain.         08/2016 cardiology - h/o cad, htn,  HISTORY:  Past Medical History:  Diagnosis Date  . Anemia   . Anginal pain ([place])   . Arthritis   . Cancer (HCC)    BREAST  . CKD (chronic kidney disease), stage III ([place])   . Coronary artery disease    S/P CABG and multiple PCI's  . Diabetes mellitus without complication ([place])   . Edema   . Failure to thrive in adult   . GERD (gastroesophageal reflux disease)   . History of breast cancer   . HOH (hard of hearing)   . Hyperlipidemia   . Hypertension   . Myocardial infarction ([place])   . Palpitations    Past Surgical History:  Procedure Laterality Date  . ABDOMINAL HYSTERECTOMY    . ABOVE KNEE LEG AMPUTATION Left 2013  . ANTERIOR VITRECTOMY Left 11/16/2015   Procedure: ANTERIOR VITRECTOMY;  Surgeon: Eulogio Bear, MD;  Location: [facility] ORS;  Service: Ophthalmology;  Laterality: Left;  . BLADDER SURGERY    . CATARACT EXTRACTION W/PHACO Left 11/16/2015   Procedure: CATARACT EXTRACTION PHACO AND INTRAOCULAR LENS PLACEMENT (IOC);  Surgeon: Eulogio Bear, MD;  Location: [facility] ORS;  Service: Ophthalmology;  Laterality: Left;  Lot # [id number] H [place]; 01:22.8 AP%:12.6 CDE: 10.46  . CATARACT EXTRACTION W/PHACO Right 08/22/2016   Procedure:  CATARACT EXTRACTION PHACO AND INTRAOCULAR LENS PLACEMENT (IOC);  Surgeon: Eulogio Bear, MD;  Location: [facility] ORS;  Service: Ophthalmology;  Laterality: Right;  Lot # [id number] H [place]: 00:52.1 AP%:8.7 CDE: 4.99   . CORONARY ANGIOPLASTY     STENT  . CORONARY ARTERY BYPASS GRAFT    . MASTECTOMY    . TUMOR REMOVAL     ABDOMINAL   Family History  Problem Relation Age of Onset  . Sudden death Mother   . Arthritis Father   . Stroke Father   . Breast cancer Sister   . Diabetes Grandchild     Allergies: Amoxicillin; Gabapentin; Mirtazapine; and Other Current Outpatient Prescriptions on File Prior to Visit  Medication Sig Dispense Refill  . acetaminophen (TYLENOL) 500 MG tablet Take 1 tablet (500 mg total) by mouth every 6 (six) hours as needed. 30 tablet 0  . amLODipine (NORVASC) 10 MG tablet Take 1 tablet (10 mg total) by mouth daily. 90 tablet 3  . aspirin EC 81 MG tablet Take 1 tablet (81 mg total) by mouth daily. 30 tablet 5  . atorvastatin (LIPITOR) 40 MG tablet Take 1 tablet (40 mg total) by mouth daily. 90 tablet 3  . blood glucose meter kit and supplies KIT Dispense based on patient and insurance preference. Use up to four times daily as directed. (FOR ICD-9 250.00, 250.01). 1 each 0  . carvedilol (COREG) 12.5  MG tablet Take 1 tablet (12.5 mg total) by mouth 2 (two) times daily with a meal. 180 tablet 3  . clopidogrel (PLAVIX) 75 MG tablet Take 1 tablet (75 mg total) by mouth daily. 90 tablet 3  . fluticasone (FLONASE) 50 MCG/ACT nasal spray Place into both nostrils daily.    Marland Kitchen glucose blood (ONE TOUCH TEST STRIPS) test strip Use as instructed twice daily 200 each 3  . hydrALAZINE (APRESOLINE) 10 MG tablet Take 1 tablet (10 mg total) by mouth 3 (three) times daily. 90 tablet 3  . Insulin Glargine (BASAGLAR KWIKPEN) 100 UNIT/ML SOPN Inject 0.03 mLs (3 Units total) into the skin daily. 1 pen 0  . Insulin Pen Needle (BD PEN NEEDLE NANO U/F) 32G X 4 MM MISC 1 Syringe by Does not apply  route daily. 300 each 3  . lisinopril (PRINIVIL,ZESTRIL) 2.5 MG tablet Take 2 tablets (5 mg total) by mouth daily. 90 tablet 0  . lisinopril (PRINIVIL,ZESTRIL) 2.5 MG tablet TAKE 1 TABLET BY MOUTH ONCE DAILY 90 tablet 0  . metFORMIN (GLUCOPHAGE) 1000 MG tablet Take 1 tablet (1,000 mg total) by mouth daily. 90 tablet 1  . mirtazapine (REMERON) 15 MG tablet TAKE ONE TABLET BY MOUTH AT BEDTIME 90 tablet 0  . Multiple Vitamin (MULTIVITAMIN) tablet Take 1 tablet by mouth daily.    . ONE TOUCH LANCETS MISC Use as directed 2 times per day 200 each 3  . pantoprazole (PROTONIX) 40 MG tablet Take 1 tablet (40 mg total) by mouth 2 (two) times daily before a meal. (Patient taking differently: Take 40 mg by mouth daily. ) 180 tablet 0   No current facility-administered medications on file prior to visit.     Social History  Substance Use Topics  . Smoking status: Never Smoker  . Smokeless tobacco: Never Used  . Alcohol use No    Review of Systems  Constitutional: Negative for chills and fever.  Eyes: Negative for visual disturbance.  Respiratory: Negative for cough, shortness of breath and wheezing.   Cardiovascular: Negative for chest pain and palpitations.  Gastrointestinal: Negative for nausea and vomiting.      Objective:    BP (!) 155/62   Pulse 62   Temp 98.1 F (36.7 C) (Oral)   Ht 5' (1.524 m)   Wt 85 lb (38.6 kg)   SpO2 98%   BMI 16.60 kg/m  BP Readings from Last 3 Encounters:  04/08/17 (!) 155/62  02/24/17 (!) 166/66  02/03/17 (!) 182/62   Wt Readings from Last 3 Encounters:  04/08/17 85 lb (38.6 kg)  09/11/16 73 lb 2 oz (33.2 kg)  08/22/16 82 lb (37.2 kg)    Physical Exam  Constitutional: She appears well-developed and well-nourished.  Eyes: Conjunctivae are normal.  Cardiovascular: Normal rate, regular rhythm, normal heart sounds and normal pulses.   Pulmonary/Chest: Effort normal and breath sounds normal. She has no wheezes. She has no rhonchi. She has no rales.    Neurological: She is alert.  Skin: Skin is warm and dry.  Psychiatric: She has a normal mood and affect. Her speech is normal and behavior is normal. Thought content normal.  Vitals reviewed.      Assessment & Plan:   Problem List Items Addressed This Visit      Cardiovascular and Mediastinum   Essential hypertension - Primary    Systolic blood pressure elevated which it has been in the past. Diastolic blood pressure is on the low side , consistently 60's. Reassured by  normal neurologic exam today. Patient has no signs or symptoms of hypertensive urgency or emergency at this time. Jaws and recently added to her regimen from prior PCP. Will continue close monitoring of patient this time and also consult cardiology for further hypertension management if BP readings at home are elevated.       Relevant Orders   Basic metabolic panel (Completed)     Digestive   GERD (gastroesophageal reflux disease)    Symptoms controlled on Protonix.        Endocrine   DM (diabetes mellitus) ([place])    Patient seems to have good understanding of hypoglycemia and denies any recent episodes of hypoglycemia. Pending a1c      Relevant Orders   Hemoglobin A1c (Completed)       I am having Ms. Ducey maintain her fluticasone, metFORMIN, carvedilol, atorvastatin, amLODipine, glucose blood, ONE TOUCH LANCETS, multivitamin, Insulin Pen Needle, clopidogrel, pantoprazole, blood glucose meter kit and supplies, BASAGLAR KWIKPEN, acetaminophen, aspirin EC, lisinopril, hydrALAZINE, mirtazapine, and lisinopril.   No orders of the defined types were placed in this encounter.   Return precautions given.   Risks, benefits, and alternatives of the medications and treatment plan prescribed today were discussed, and patient expressed understanding.   Education regarding symptom management and diagnosis given to patient on AVS.  Continue to follow with [name], FNP for routine health maintenance.    Tiffany Bailey and I agreed with plan.   [name], FNP

## 2017-04-09 NOTE — Assessment & Plan Note (Signed)
Symptoms controlled on Protonix

## 2017-04-09 NOTE — Assessment & Plan Note (Signed)
Patient seems to have good understanding of hypoglycemia and denies any recent episodes of hypoglycemia. Pending a1c

## 2017-04-09 NOTE — Assessment & Plan Note (Addendum)
Systolic blood pressure elevated which it has been in the past. Diastolic blood pressure is on the low side , consistently 60's. Reassured by normal neurologic exam today. Patient has no signs or symptoms of hypertensive urgency or emergency at this time. Jaws and recently added to her regimen from prior PCP. Will continue close monitoring of patient this time and also consult cardiology for further hypertension management if BP readings at home are elevated.

## 2017-04-10 ENCOUNTER — Telehealth: Payer: Self-pay | Admitting: Family

## 2017-04-10 NOTE — Telephone Encounter (Signed)
noted 

## 2017-04-10 NOTE — Telephone Encounter (Signed)
Spoke with patient states she hasn't been checking blood pressure . Checked blood pressure today and it ws 166/63.   Patient is agreeable to taking hydralazine two tablets three times a day.  She will notify us if if she starts to experience symptoms of dizziness or lightheadedness .   She has follow up appointment scheduled on 04/23/2017.

## 2017-04-10 NOTE — Progress Notes (Signed)
Patient stated her bp is 162/63 right now.  She stated she will comply with directions.

## 2017-04-10 NOTE — Telephone Encounter (Signed)
Call pt  How is BP ?   In reviewing her chart, if she is agreeable, we should consider increasing the hydralazine. She is on low dose however it is short acting. BP goal < 130/80 if she can tolerate.  Would she take hydralazine TWO tablets THREE times per day? This would be 20mg  TID.  If works well and she is not dizzy or lightheaded, then we can reorder at that dose.   Please ensure she has a f/u scheduled  Case reviewed with supervising, Dr Deborra Medina, and she and I jointly agreed on management plan.

## 2017-04-14 ENCOUNTER — Telehealth: Payer: Self-pay | Admitting: Family

## 2017-04-14 NOTE — Telephone Encounter (Signed)
Advise to continue current regimen  Please ensure she has f/u  apppt shceulded with colleague while I am out. Please also NOTE on medications current doses so it is all up to date

## 2017-04-14 NOTE — Telephone Encounter (Signed)
Patient has been informed.

## 2017-04-14 NOTE — Telephone Encounter (Signed)
Pt called and said that [name] should increase her BP medication. Bp is down and she wants to know what to do. Please call her.

## 2017-04-14 NOTE — Telephone Encounter (Signed)
Patient was concerned that her BP was running too low. Patient read back last 6 readings: 166/63, 142/63, 141/52, 139/65, 131/55. Advised patient that her blood pressures were not low and the medication is working like it is supposed to, she should continue to monitor.

## 2017-04-22 NOTE — Telephone Encounter (Signed)
Pt lvm requesting her FL2 be faxed to [phone]

## 2017-04-23 ENCOUNTER — Ambulatory Visit (INDEPENDENT_AMBULATORY_CARE_PROVIDER_SITE_OTHER): Payer: Medicare Other | Admitting: Family

## 2017-04-23 ENCOUNTER — Encounter: Payer: Self-pay | Admitting: Family

## 2017-04-23 VITALS — BP 148/64 | HR 70 | Temp 97.5°F | Ht 60.0 in

## 2017-04-23 DIAGNOSIS — I10 Essential (primary) hypertension: Secondary | ICD-10-CM | POA: Diagnosis not present

## 2017-04-23 DIAGNOSIS — R197 Diarrhea, unspecified: Secondary | ICD-10-CM | POA: Diagnosis not present

## 2017-04-23 NOTE — Assessment & Plan Note (Signed)
Improvement after increasing hydralazine 20 mg 3 times a day After further discussion with patient, she is adding salt to her foods on a regular basis. We discussed limiting the salt and replacing with Mrs. Dash. She'll monitor blood pressure at home. If no improvement, at that point I will further increase the hydralazine.

## 2017-04-23 NOTE — Patient Instructions (Signed)
Blood pressure is high today however improved.   Before increasing medications, I would like you to LIMIT salt.   Try Ms DASH  Stool studies  Let me know if diarrhea worsens in any way   DASH Eating Plan DASH stands for "Dietary Approaches to Stop Hypertension." The DASH eating plan is a healthy eating plan that has been shown to reduce high blood pressure (hypertension). It may also reduce your risk for type 2 diabetes, heart disease, and stroke. The DASH eating plan may also help with weight loss. What are tips for following this plan? General guidelines  Avoid eating more than 2,300 mg (milligrams) of salt (sodium) a day. If you have hypertension, you may need to reduce your sodium intake to 1,500 mg a day.  Limit alcohol intake to no more than 1 drink a day for nonpregnant women and 2 drinks a day for men. One drink equals 12 oz of beer, 5 oz of wine, or 1 oz of hard liquor.  Work with your health care provider to maintain a healthy body weight or to lose weight. Ask what an ideal weight is for you.  Get at least 30 minutes of exercise that causes your heart to beat faster (aerobic exercise) most days of the week. Activities may include walking, swimming, or biking.  Work with your health care provider or diet and nutrition specialist (dietitian) to adjust your eating plan to your individual calorie needs. Reading food labels  Check food labels for the amount of sodium per serving. Choose foods with less than 5 percent of the Daily Value of sodium. Generally, foods with less than 300 mg of sodium per serving fit into this eating plan.  To find whole grains, look for the word "whole" as the first word in the ingredient list. Shopping  Buy products labeled as "low-sodium" or "no salt added."  Buy fresh foods. Avoid canned foods and premade or frozen meals. Cooking  Avoid adding salt when cooking. Use salt-free seasonings or herbs instead of table salt or sea salt. Check with  your health care provider or pharmacist before using salt substitutes.  Do not fry foods. Cook foods using healthy methods such as baking, boiling, grilling, and broiling instead.  Cook with heart-healthy oils, such as olive, canola, soybean, or sunflower oil. Meal planning   Eat a balanced diet that includes: ? 5 or more servings of fruits and vegetables each day. At each meal, try to fill half of your plate with fruits and vegetables. ? Up to 6-8 servings of whole grains each day. ? Less than 6 oz of lean meat, poultry, or fish each day. A 3-oz serving of meat is about the same size as a deck of cards. One egg equals 1 oz. ? 2 servings of low-fat dairy each day. ? A serving of nuts, seeds, or beans 5 times each week. ? Heart-healthy fats. Healthy fats called Omega-3 fatty acids are found in foods such as flaxseeds and coldwater fish, like sardines, salmon, and mackerel.  Limit how much you eat of the following: ? Canned or prepackaged foods. ? Food that is high in trans fat, such as fried foods. ? Food that is high in saturated fat, such as fatty meat. ? Sweets, desserts, sugary drinks, and other foods with added sugar. ? Full-fat dairy products.  Do not salt foods before eating.  Try to eat at least 2 vegetarian meals each week.  Eat more home-cooked food and less restaurant, buffet, and fast food.  When eating at a restaurant, ask that your food be prepared with less salt or no salt, if possible. What foods are recommended? The items listed may not be a complete list. Talk with your dietitian about what dietary choices are best for you. Grains Whole-grain or whole-wheat bread. Whole-grain or whole-wheat pasta. Brown rice. [place]. Bulgur. Whole-grain and low-sodium cereals. Pita bread. Low-fat, low-sodium crackers. Whole-wheat flour tortillas. Vegetables Fresh or frozen vegetables (raw, steamed, roasted, or grilled). Low-sodium or reduced-sodium tomato and vegetable  juice. Low-sodium or reduced-sodium tomato sauce and tomato paste. Low-sodium or reduced-sodium canned vegetables. Fruits All fresh, dried, or frozen fruit. Canned fruit in natural juice (without added sugar). Meat and other protein foods Skinless chicken or [place]. Ground chicken or [place]. Pork with fat trimmed off. Fish and seafood. Egg whites. Dried beans, peas, or lentils. Unsalted nuts, nut butters, and seeds. Unsalted canned beans. Lean cuts of beef with fat trimmed off. Low-sodium, lean deli meat. Dairy Low-fat (1%) or fat-free (skim) milk. Fat-free, low-fat, or reduced-fat cheeses. Nonfat, low-sodium ricotta or cottage cheese. Low-fat or nonfat yogurt. Low-fat, low-sodium cheese. Fats and oils Soft margarine without trans fats. Vegetable oil. Low-fat, reduced-fat, or light mayonnaise and salad dressings (reduced-sodium). Canola, safflower, olive, soybean, and sunflower oils. Avocado. Seasoning and other foods Herbs. Spices. Seasoning mixes without salt. Unsalted popcorn and pretzels. Fat-free sweets. What foods are not recommended? The items listed may not be a complete list. Talk with your dietitian about what dietary choices are best for you. Grains Baked goods made with fat, such as croissants, muffins, or some breads. Dry pasta or rice meal packs. Vegetables Creamed or fried vegetables. Vegetables in a cheese sauce. Regular canned vegetables (not low-sodium or reduced-sodium). Regular canned tomato sauce and paste (not low-sodium or reduced-sodium). Regular tomato and vegetable juice (not low-sodium or reduced-sodium). Angie Fava. Olives. Fruits Canned fruit in a light or heavy syrup. Fried fruit. Fruit in cream or butter sauce. Meat and other protein foods Fatty cuts of meat. Ribs. Fried meat. Berniece Salines. Sausage. Bologna and other processed lunch meats. Salami. Fatback. Hotdogs. Bratwurst. Salted nuts and seeds. Canned beans with added salt. Canned or smoked fish. Whole eggs or egg yolks.  Chicken or [place] with skin. Dairy Whole or 2% milk, cream, and half-and-half. Whole or full-fat cream cheese. Whole-fat or sweetened yogurt. Full-fat cheese. Nondairy creamers. Whipped toppings. Processed cheese and cheese spreads. Fats and oils Butter. Stick margarine. Lard. Shortening. Ghee. Bacon fat. Tropical oils, such as coconut, palm kernel, or palm oil. Seasoning and other foods Salted popcorn and pretzels. Onion salt, garlic salt, seasoned salt, table salt, and sea salt. Worcestershire sauce. Tartar sauce. Barbecue sauce. Teriyaki sauce. Soy sauce, including reduced-sodium. Steak sauce. Canned and packaged gravies. Fish sauce. Oyster sauce. Cocktail sauce. Horseradish that you find on the shelf. Ketchup. Mustard. Meat flavorings and tenderizers. Bouillon cubes. Hot sauce and Tabasco sauce. Premade or packaged marinades. Premade or packaged taco seasonings. Relishes. Regular salad dressings. Where to find more information:  National Heart, Lung, and [place]: https://wilson-eaton.com/  American Heart Association: www.heart.org Summary  The DASH eating plan is a healthy eating plan that has been shown to reduce high blood pressure (hypertension). It may also reduce your risk for type 2 diabetes, heart disease, and stroke.  With the DASH eating plan, you should limit salt (sodium) intake to 2,300 mg a day. If you have hypertension, you may need to reduce your sodium intake to 1,500 mg a day.  When on the DASH eating plan,  aim to eat more fresh fruits and vegetables, whole grains, lean proteins, low-fat dairy, and heart-healthy fats.  Work with your health care provider or diet and nutrition specialist (dietitian) to adjust your eating plan to your individual calorie needs. This information is not intended to replace advice given to you by your health care provider. Make sure you discuss any questions you have with your health care provider. Document Released: 05/23/2011 Document Revised:  05/27/2016 Document Reviewed: 05/27/2016 Elsevier Interactive Patient Education  2017 [employer].

## 2017-04-23 NOTE — Progress Notes (Signed)
Pre visit review using our clinic review tool, if applicable. No additional management support is needed unless otherwise documented below in the visit note. 

## 2017-04-23 NOTE — Telephone Encounter (Signed)
FL2 has been faxed.  

## 2017-04-23 NOTE — Assessment & Plan Note (Addendum)
Benign abdominal exam. Afebrile. Etiology nonspecific at this time. Pending stool studies. Return precautions given to patient

## 2017-04-23 NOTE — Progress Notes (Signed)
Subjective:    Patient ID: Tiffany Bailey, female    DOB: 08-16-1927, 83 y.o.   MRN: [id number]  CC: [name] is a 83 y.o. female who presents today for follow up.   HPI: Complains of diarrhea for months, comes and goes. Describes 3-4 episodes per day when it occurs. Then resolves on its own for several days. Brown watery. No blood. No food triggers. No recent antibiotics. No fever, unintentional weight loss, abdominal pain.  No h/o lactose intolerance. Eating more fish, chicken.  No longer does colonoscopy; cannot recall last one.  GERD- protonix 'helps a lot' with epigastic burning.   HTN- increased hydralazine 6m TID, blood pressure at home still has labile blood pressures, 150/ 50. Notes 'eating too much salt.' Adding salt to foods.  Denies exertional chest pain or pressure, numbness or tingling radiating to left arm or jaw, palpitations, dizziness, frequent headaches, changes in vision, or shortness of breath.        HISTORY:  Past Medical History:  Diagnosis Date  . Anemia   . Anginal pain ([place]   . Arthritis   . Cancer (HCC)    BREAST  . CKD (chronic kidney disease), stage III ([place]   . Coronary artery disease    S/P CABG and multiple PCI's  . Diabetes mellitus without complication ([place]   . Edema   . Failure to thrive in adult   . GERD (gastroesophageal reflux disease)   . History of breast cancer   . HOH (hard of hearing)   . Hyperlipidemia   . Hypertension   . Myocardial infarction ([place]   . Palpitations    Past Surgical History:  Procedure Laterality Date  . ABDOMINAL HYSTERECTOMY    . ABOVE KNEE LEG AMPUTATION Left 2013  . BLADDER SURGERY    . CORONARY ANGIOPLASTY     STENT  . CORONARY ARTERY BYPASS GRAFT    . MASTECTOMY    . TUMOR REMOVAL     ABDOMINAL   Family History  Problem Relation Age of Onset  . Sudden death Mother   . Arthritis Father   . Stroke Father   . Breast cancer Sister   . Diabetes Grandchild     Allergies:  Amoxicillin; Gabapentin; Mirtazapine; and Other Current Outpatient Medications on File Prior to Visit  Medication Sig Dispense Refill  . acetaminophen (TYLENOL) 500 MG tablet Take 1 tablet (500 mg total) by mouth every 6 (six) hours as needed. 30 tablet 0  . amLODipine (NORVASC) 10 MG tablet Take 1 tablet (10 mg total) by mouth daily. 90 tablet 3  . aspirin EC 81 MG tablet Take 1 tablet (81 mg total) by mouth daily. 30 tablet 5  . atorvastatin (LIPITOR) 40 MG tablet Take 1 tablet (40 mg total) by mouth daily. 90 tablet 3  . blood glucose meter kit and supplies KIT Dispense based on patient and insurance preference. Use up to four times daily as directed. (FOR ICD-9 250.00, 250.01). 1 each 0  . carvedilol (COREG) 12.5 MG tablet Take 1 tablet (12.5 mg total) by mouth 2 (two) times daily with a meal. 180 tablet 3  . clopidogrel (PLAVIX) 75 MG tablet Take 1 tablet (75 mg total) by mouth daily. 90 tablet 3  . fluticasone (FLONASE) 50 MCG/ACT nasal spray Place into both nostrils daily.    .Marland Kitchenglucose blood (ONE TOUCH TEST STRIPS) test strip Use as instructed twice daily 200 each 3  . hydrALAZINE (APRESOLINE) 10 MG tablet Take 1  tablet (10 mg total) by mouth 3 (three) times daily. (Patient taking differently: Take 20 mg 3 (three) times daily by mouth. ) 90 tablet 3  . Insulin Glargine (BASAGLAR KWIKPEN) 100 UNIT/ML SOPN Inject 0.03 mLs (3 Units total) into the skin daily. 1 pen 0  . Insulin Pen Needle (BD PEN NEEDLE NANO U/F) 32G X 4 MM MISC 1 Syringe by Does not apply route daily. 300 each 3  . lisinopril (PRINIVIL,ZESTRIL) 2.5 MG tablet TAKE 1 TABLET BY MOUTH ONCE DAILY 90 tablet 0  . metFORMIN (GLUCOPHAGE) 1000 MG tablet Take 1 tablet (1,000 mg total) by mouth daily. 90 tablet 1  . mirtazapine (REMERON) 15 MG tablet TAKE ONE TABLET BY MOUTH AT BEDTIME 90 tablet 0  . Multiple Vitamin (MULTIVITAMIN) tablet Take 1 tablet by mouth daily.    . ONE TOUCH LANCETS MISC Use as directed 2 times per day 200 each 3   . pantoprazole (PROTONIX) 40 MG tablet Take 1 tablet (40 mg total) by mouth 2 (two) times daily before a meal. (Patient taking differently: Take 40 mg by mouth daily. ) 180 tablet 0   No current facility-administered medications on file prior to visit.     Social History   Tobacco Use  . Smoking status: Never Smoker  . Smokeless tobacco: Never Used  Substance Use Topics  . Alcohol use: No  . Drug use: No    Review of Systems  Constitutional: Negative for chills and fever.  Eyes: Negative for visual disturbance.  Respiratory: Negative for cough and shortness of breath.   Cardiovascular: Negative for chest pain and palpitations.  Gastrointestinal: Positive for diarrhea. Negative for abdominal distention, abdominal pain, anal bleeding, blood in stool, constipation, nausea and vomiting.  Neurological: Negative for dizziness and syncope.      Objective:    BP (!) 148/64   Pulse 70   Temp (!) 97.5 F (36.4 C) (Oral)   Ht 5' (1.524 m)   SpO2 98%   BMI 16.60 kg/m  BP Readings from Last 3 Encounters:  04/23/17 (!) 148/64  04/08/17 (!) 155/62  02/24/17 (!) 166/66   Wt Readings from Last 3 Encounters:  04/08/17 85 lb (38.6 kg)  09/11/16 73 lb 2 oz (33.2 kg)  08/22/16 82 lb (37.2 kg)    Physical Exam  Constitutional: She appears well-developed and well-nourished.  Eyes: Conjunctivae are normal.  Cardiovascular: Normal rate, regular rhythm, normal heart sounds and normal pulses.  Pulmonary/Chest: Effort normal and breath sounds normal. She has no wheezes. She has no rhonchi. She has no rales.  Abdominal: Soft. Normal appearance and bowel sounds are normal. She exhibits no distension, no fluid wave, no ascites and no mass. There is no tenderness. There is no rigidity, no rebound, no guarding and no CVA tenderness.  Neurological: She is alert.  Skin: Skin is warm and dry.  Psychiatric: She has a normal mood and affect. Her speech is normal and behavior is normal. Thought  content normal.  Vitals reviewed.      Assessment & Plan:   Problem List Items Addressed This Visit      Cardiovascular and Mediastinum   Essential hypertension    Improvement after increasing hydralazine 20 mg 3 times a day After further discussion with patient, she is adding salt to her foods on a regular basis. We discussed limiting the salt and replacing with Mrs. Dash. She'll monitor blood pressure at home. If no improvement, at that point I will further increase the hydralazine.  Other   Diarrhea - Primary     Benign abdominal exam. Afebrile. Etiology nonspecific at this time. Pending stool studies. Return precautions given to patient      Relevant Orders   Fecal occult blood, imunochemical   C. difficile GDH and Toxin A/B   Gastrointestinal Pathogen Panel PCR       I am having Tiffany Bailey maintain her fluticasone, metFORMIN, carvedilol, atorvastatin, amLODipine, glucose blood, ONE TOUCH LANCETS, multivitamin, Insulin Pen Needle, clopidogrel, pantoprazole, blood glucose meter kit and supplies, BASAGLAR KWIKPEN, acetaminophen, aspirin EC, hydrALAZINE, mirtazapine, and lisinopril.   No orders of the defined types were placed in this encounter.   Return precautions given.   Risks, benefits, and alternatives of the medications and treatment plan prescribed today were discussed, and patient expressed understanding.   Education regarding symptom management and diagnosis given to patient on AVS.  Continue to follow with [name], FNP for routine health maintenance.   Tiffany Bailey and I agreed with plan.   [name], FNP

## 2017-05-05 ENCOUNTER — Telehealth: Payer: Self-pay | Admitting: Family

## 2017-05-05 NOTE — Telephone Encounter (Signed)
Pt's son dropped off a letter. He is requesting a letter from our office about his mother. Please see not in paper work from son. Paper work is located up front in [facility].

## 2017-05-06 ENCOUNTER — Other Ambulatory Visit: Payer: Self-pay | Admitting: Family

## 2017-05-06 DIAGNOSIS — E785 Hyperlipidemia, unspecified: Secondary | ICD-10-CM

## 2017-05-06 MED ORDER — AMLODIPINE BESYLATE 10 MG PO TABS: 10.0000 mg | ORAL_TABLET | Freq: Every day | ORAL | 0 refills | Status: DC

## 2017-05-06 MED ORDER — ATORVASTATIN CALCIUM 40 MG PO TABS: 40.0000 mg | ORAL_TABLET | Freq: Every day | ORAL | 0 refills | Status: DC

## 2017-05-06 NOTE — Telephone Encounter (Signed)
Last OV 07/03/16

## 2017-05-06 NOTE — Telephone Encounter (Signed)
Refill sent to [facility] as requested.

## 2017-05-06 NOTE — Telephone Encounter (Signed)
Copied from [place] #[id number]. Topic: Quick Communication - See Telephone Encounter >> May 06, 2017 12:14 PM Aurelio Brash B wrote: CRM for notification. See Telephone encounter for:  Refill atorvastatin and amlodipine   [facility] 05/06/17.

## 2017-05-07 NOTE — Telephone Encounter (Addendum)
Left voice mail to call back, HIPPA complaint to confirm . CRM created patient needs appointment (pateint of [name], NP) ok to schedule with Dr Aundra Dubin. (Paperwork on [employer] in paperwork forms  upcoming appointments)

## 2017-05-07 NOTE — Telephone Encounter (Signed)
Paperwork has been given to provider. Patient needs an appointment.

## 2017-05-12 ENCOUNTER — Other Ambulatory Visit: Payer: Self-pay | Admitting: Family Medicine

## 2017-05-13 NOTE — Telephone Encounter (Signed)
Mailed unable to reach letter.

## 2017-05-13 NOTE — Telephone Encounter (Signed)
Tried calling number left on voicemail cell phone voicemail box full Tiffany Bailey

## 2017-05-21 ENCOUNTER — Ambulatory Visit (INDEPENDENT_AMBULATORY_CARE_PROVIDER_SITE_OTHER): Payer: Medicare Other | Admitting: Family Medicine

## 2017-05-21 ENCOUNTER — Encounter: Payer: Self-pay | Admitting: Family Medicine

## 2017-05-21 VITALS — BP 150/56 | HR 74 | Temp 97.5°F

## 2017-05-21 DIAGNOSIS — E1129 Type 2 diabetes mellitus with other diabetic kidney complication: Secondary | ICD-10-CM | POA: Diagnosis not present

## 2017-05-21 DIAGNOSIS — IMO0002 Reserved for concepts with insufficient information to code with codable children: Secondary | ICD-10-CM

## 2017-05-21 DIAGNOSIS — Z794 Long term (current) use of insulin: Secondary | ICD-10-CM | POA: Diagnosis not present

## 2017-05-21 DIAGNOSIS — M25511 Pain in right shoulder: Secondary | ICD-10-CM

## 2017-05-21 DIAGNOSIS — E1151 Type 2 diabetes mellitus with diabetic peripheral angiopathy without gangrene: Secondary | ICD-10-CM | POA: Diagnosis not present

## 2017-05-21 DIAGNOSIS — R634 Abnormal weight loss: Secondary | ICD-10-CM

## 2017-05-21 DIAGNOSIS — E1165 Type 2 diabetes mellitus with hyperglycemia: Secondary | ICD-10-CM | POA: Diagnosis not present

## 2017-05-21 LAB — GLUCOSE, POCT (MANUAL RESULT ENTRY): POC Glucose: 303 mg/dl — AB (ref 70–99)

## 2017-05-21 MED ORDER — INSULIN GLARGINE 100 UNIT/ML SOLOSTAR PEN: 4.0000 [IU] | PEN_INJECTOR | Freq: Every evening | SUBCUTANEOUS | 11 refills | Status: DC

## 2017-05-21 MED ORDER — INSULIN PEN NEEDLE 32G X 4 MM MISC: 1.0000 | Freq: Every day | 3 refills | Status: DC

## 2017-05-21 MED ORDER — GLUCOSE BLOOD VI STRP: ORAL_STRIP | 3 refills | Status: DC

## 2017-05-21 MED ORDER — MIRTAZAPINE 15 MG PO TABS: 15.0000 mg | ORAL_TABLET | Freq: Every day | ORAL | 0 refills | Status: DC

## 2017-05-21 NOTE — Progress Notes (Signed)
Subjective:    Patient ID: Tiffany Bailey, female    DOB: 02-27-1928, 83 y.o.   MRN: [id number]  HPI This is an 83 yo female, accompanied by her daughter, who presents today with right arm and leg pain. Right arm pain started after she lifted a pot "a while ago" (? Several days ago). Pain comes and goes, up to 10/10. She can get it "settled down," but it hurts if she lies on it. Has applied heat with some improvement of pain. Having phantom pains of left LE.   Was previously on Remeron, helped with sleep, not sure why she stopped it.   She is living at a group home, but doesn't have enough help with personal care. No recent falls. She requests additional assistance.   Has stopped taking her insulin and is out of strips and hasn't been taking blood sugars. Blood sugar in office today 303. Last hemoglobin A1C 04/08/17- 8.7. Was having problems with low sugars and was feeling badly. Takes insulin in the morning, doesn't always eat much early in the day. Always eats a good dinner.    Past Medical History:  Diagnosis Date  . Anemia   . Anginal pain ([place])   . Arthritis   . Cancer (HCC)    BREAST  . CKD (chronic kidney disease), stage III ([place])   . Coronary artery disease    S/P CABG and multiple PCI's  . Diabetes mellitus without complication ([place])   . Edema   . Failure to thrive in adult   . GERD (gastroesophageal reflux disease)   . History of breast cancer   . HOH (hard of hearing)   . Hyperlipidemia   . Hypertension   . Myocardial infarction ([place])   . Palpitations    Past Surgical History:  Procedure Laterality Date  . ABDOMINAL HYSTERECTOMY    . ABOVE KNEE LEG AMPUTATION Left 2013  . ANTERIOR VITRECTOMY Left 11/16/2015   Procedure: ANTERIOR VITRECTOMY;  Surgeon: Eulogio Bear, MD;  Location: [facility] ORS;  Service: Ophthalmology;  Laterality: Left;  . BLADDER SURGERY    . CATARACT EXTRACTION W/PHACO Left 11/16/2015   Procedure: CATARACT EXTRACTION PHACO AND INTRAOCULAR LENS  PLACEMENT (IOC);  Surgeon: Eulogio Bear, MD;  Location: [facility] ORS;  Service: Ophthalmology;  Laterality: Left;  Lot # [id number] H [place]; 01:22.8 AP%:12.6 CDE: 10.46  . CATARACT EXTRACTION W/PHACO Right 08/22/2016   Procedure: CATARACT EXTRACTION PHACO AND INTRAOCULAR LENS PLACEMENT (IOC);  Surgeon: Eulogio Bear, MD;  Location: [facility] ORS;  Service: Ophthalmology;  Laterality: Right;  Lot # [id number] H [place]: 00:52.1 AP%:8.7 CDE: 4.99   . CORONARY ANGIOPLASTY     STENT  . CORONARY ARTERY BYPASS GRAFT    . MASTECTOMY    . TUMOR REMOVAL     ABDOMINAL   Family History  Problem Relation Age of Onset  . Sudden death Mother   . Arthritis Father   . Stroke Father   . Breast cancer Sister   . Diabetes Grandchild          Review of Systems Per HPI    Objective:   Physical Exam  Constitutional: She is oriented to person, place, and time. No distress.  Elderly, thin female in wheelchair. Appears stated age.   HENT:  Head: Normocephalic and atraumatic.  Eyes: Conjunctivae are normal.  Cardiovascular: Normal rate, regular rhythm and normal heart sounds.  Pulmonary/Chest: Effort normal and breath sounds normal.  Musculoskeletal: She exhibits no edema.  Right shoulder: She exhibits decreased range of motion (decreased abduction. flexion and strength. ), tenderness and bony tenderness.  Neurological: She is alert and oriented to person, place, and time.  Skin: Skin is warm and dry. She is not diaphoretic.  Psychiatric: She has a normal mood and affect. Her behavior is normal. Thought content normal.  Vitals reviewed.     Pulse 74   Temp (!) 97.5 F (36.4 C) (Oral)   SpO2 99%  BP Readings from Last 3 Encounters:  04/23/17 (!) 148/64  04/08/17 (!) 155/62  02/24/17 (!) 166/66   BP: (!) 150/56        Assessment & Plan:  1. Acute pain of right shoulder - patient on clopidogrel and is therefore unable to take NSAIDs.  Encouraged her to take 2 extra strength Tylenol every  8-12 hours, apply heat as needed, and do gentle range of motion exercises 2-3 times a day  2. Type 2 diabetes mellitus with other diabetic kidney complication, with long-term current use of insulin (HCC) - POCT glucose (manual entry) -She is currently not regularly taking her insulin or checking her blood sugars -Encouraged her to check her blood sugars, I have sent in a new prescription for her insulin and have discussed her taking 4 units with her evening meal to see if she has less fluctuation in her blood sugars - glucose blood (ONE TOUCH TEST STRIPS) test strip; Use as instructed twice daily  Dispense: 200 each; Refill: 3 - Insulin Pen Needle (BD PEN NEEDLE NANO U/F) 32G X 4 MM MISC; 1 Syringe by Does not apply route daily.  Dispense: 90 each; Refill: 3 - Insulin Glargine (LANTUS SOLOSTAR) 100 UNIT/ML Solostar Pen; Inject 4 Units into the skin every evening.  Dispense: 15 mL; Refill: 11 -Follow-up at PCPs office in 1 month -She stated she needs some additional paperwork to be filled out and I provided her the fax number for her PCPs office  3. Weight loss -She has this itching listed as intolerance to mirtazapine, but has been on it as far as I can tell since that was listed.  She stated she did well on it and it helped with her pain and sleep.  I have sent in a refill. -Was unable to weigh her today and so I am not sure if she has further weight loss, but she is very thin. - mirtazapine (REMERON) 15 MG tablet; Take 1 tablet (15 mg total) by mouth at bedtime.  Dispense: 90 tablet; Refill: 0   Clarene Reamer, FNP-BC  [facility]  05/21/2017 1:02 PM

## 2017-05-21 NOTE — Patient Instructions (Signed)
The fax number for [place] at [facility] is [phone]  Please do gentle range of motion with your shoulder and neck several times a day  I have sent in a refill of your Remeron to take at bedtime  Try two Tylenol every 8-12 hours  You need to go back on your insulin, please start checking your blood sugars and get started back on your insulin.   PLease follow up with someone at [name]'s office in 1 month.

## 2017-06-17 ENCOUNTER — Other Ambulatory Visit: Payer: Self-pay | Admitting: Family Medicine

## 2017-06-18 ENCOUNTER — Ambulatory Visit: Payer: Self-pay | Admitting: *Deleted

## 2017-06-18 NOTE — Telephone Encounter (Signed)
  Called in c/o your BP being high.   It was 181/76.   After checking it again after talking with me for a few minutes it came down to 166/75.   The pt was talking to her daughter in the background there at the group home where she lives.   She is upset because there is someone there who she thinks may be trying to steal her money.   She kept talking to someone in the background telling them to "get that man off the porch".   Then she started talking about having a nose bleed that she has on and off for the last 2 yrs.   She also went on to tell me she has a pain in her nose that goes to her eye and into her head.   She went to [facility] about this and no one can find out what is wrong with her.  "I think it's my sinuses but no one can find anything wrong".   I went to the eye dr the day after Christmas and he said my eyes were fine. She has an appt on Monday at 9:15 so I instructed her to keep her appt.    I also informed her that being upset can cause her BP to be high.   She stated,   "I'm upset about that man on the porch here at the group home where I live".   Daughter was there with her too. Reason for Disposition . [9] Systolic BP  >= 833 OR Diastolic >= 90 AND [8] taking BP medications  Answer Assessment - Initial Assessment Questions 1. BLOOD PRESSURE: "What is the blood pressure?" "Did you take at least two measurements 5 minutes apart?"     181/76 just a few minutes ago.  I have a thumping sound in my head. 2. ONSET: "When did you take your blood pressure?"     Just a few minutes ago   I have a wrist monitor 3. HOW: "How did you obtain the blood pressure?" (e.g., visiting nurse, automatic home BP monitor)     I have a wrist monitor but my daughter checked it on my arm. 4. HISTORY: "Do you have a history of high blood pressure?"     Yes 5. MEDICATIONS: "Are you taking any medications for blood pressure?" "Have you missed any doses recently?"     I've been taking my BP meds every day. 6.  OTHER SYMPTOMS: "Do you have any symptoms?" (e.g., headache, chest pain, blurred vision, difficulty breathing, weakness)     Nose bleeds once in a while.   It started a few weeks ago.   I went to [facility] and they couldn't find anything wrong with my nose.      My left side of my head hurts and my left nose and left eye.   I have pain between my eye and my nose that shoots up into my head.   I went to the eye dr the day after Christmas and he said it was fine.    I think it's my sinuses.   I've been to several doctors for this.    7. PREGNANCY: "Is there any chance you are pregnant?" "When was your last menstrual period?"     N/A  Protocols used: HIGH BLOOD PRESSURE-A-AH

## 2017-06-20 NOTE — Telephone Encounter (Signed)
Last Ov 04/23/17 with [name], last filled by Dr.Cook 02/03/17 90 3rf

## 2017-06-20 NOTE — Telephone Encounter (Signed)
Based on review of chart patient is currently taking 20 mg of hydralazine 3 times daily.  This was sent to her pharmacy.

## 2017-06-23 ENCOUNTER — Ambulatory Visit (INDEPENDENT_AMBULATORY_CARE_PROVIDER_SITE_OTHER): Payer: Medicare Other | Admitting: Family Medicine

## 2017-06-23 ENCOUNTER — Encounter: Payer: Self-pay | Admitting: Family Medicine

## 2017-06-23 VITALS — BP 158/60 | HR 59 | Temp 97.6°F | Resp 16 | Ht 60.0 in | Wt 83.5 lb

## 2017-06-23 DIAGNOSIS — I10 Essential (primary) hypertension: Secondary | ICD-10-CM | POA: Diagnosis not present

## 2017-06-23 DIAGNOSIS — M79605 Pain in left leg: Secondary | ICD-10-CM | POA: Diagnosis not present

## 2017-06-23 MED ORDER — TIZANIDINE HCL 2 MG PO CAPS: 2.0000 mg | ORAL_CAPSULE | Freq: Three times a day (TID) | ORAL | 0 refills | Status: DC

## 2017-06-23 NOTE — Progress Notes (Signed)
Pre-visit discussion using our clinic review tool. No additional management support is needed unless otherwise documented below in the visit note.  

## 2017-06-23 NOTE — Progress Notes (Signed)
Subjective:    Patient ID: Tiffany Bailey, female    DOB: [birth year], 84 y.o.   MRN: [id number]  HPI  Tiffany Bailey is an 84 year old female who presents today reporting elevated blood pressure readings at home. She reports that her daughter takes her BP sporadically and she noted that her systolic BP was in the 277O. It is unclear how often her BP is checked as patient stated this was completed "sometime lasst week" and states that her BP is "always high". 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 She reports that pain in stump is always present and is more painful when she goes to bed at night. She has used zanaflex in the past for this at night which provides excellent benefit. She states that she is out of zanaflex and she has noticed that pain has worsened. She reports associated tinnitus which is not a new problem. She denies chest pain, palpitations, SOB, numbness, tingling, weakness, headaches, or edema. Current medications include amlodipine 10 mg daily, carvedilol 12.5 mg BId, hydralazine 20 mg TID, and Lisinopril 2.5 mg daily.  She was evaluated on 04/08/17 for elevated systolic readings. This is a recurrent issue for her with diastolic readings on the low side; noted consistently in the 60s.  At her last visit, monitoring was continued with consideration of a consult with cardiology for further hypertension management if readings remained elevated.   She reports taking her BP medication just prior to this visit, however she states that she does not regularly take her hydralazine TID as directed. She is also unclear if she had taken all of her medications when her daughter checked her blood pressure. Her daughter uses an arm cuff.  Review of Systems  Constitutional: Negative for chills, fatigue and fever.  Respiratory: Negative for cough, shortness of breath and wheezing.   Cardiovascular: Negative for chest pain and  palpitations.  Gastrointestinal: Negative for abdominal pain, diarrhea and nausea.  Genitourinary: Negative for dysuria.  Musculoskeletal: Negative for myalgias.  Neurological: Negative for dizziness, weakness, light-headedness, numbness and headaches.  Psychiatric/Behavioral:       Denies depressed or anxious mood today.   Past Medical History:  Diagnosis Date  . Anemia   . Anginal pain ([place])   . Arthritis   . Cancer (HCC)    BREAST  . CKD (chronic kidney disease), stage III ([place])   . Coronary artery disease    S/P CABG and multiple PCI's  . Diabetes mellitus without complication ([place])   . Edema   . Failure to thrive in adult   . GERD (gastroesophageal reflux disease)   . History of breast cancer   . HOH (hard of hearing)   . Hyperlipidemia   . Hypertension   . Myocardial infarction ([place])   . Palpitations      Social History   Socioeconomic History  . Marital status: Divorced    Spouse name: Not on file  . Number of children: Not on file  . Years of education: Not on file  . Highest education level: Not on file  Social Needs  . Financial resource strain: Not on file  . Food insecurity - worry: Not on file  . Food insecurity - inability: Not on file  . Transportation needs - medical: Not on file  . Transportation needs - non-medical: Not on file  Occupational History  . Not on file  Tobacco Use  .  Smoking status: Never Smoker  . Smokeless tobacco: Never Used  Substance and Sexual Activity  . Alcohol use: No  . Drug use: No  . Sexual activity: Not Currently  Other Topics Concern  . Not on file  Social History Narrative  . Not on file    Past Surgical History:  Procedure Laterality Date  . ABDOMINAL HYSTERECTOMY    . ABOVE KNEE LEG AMPUTATION Left 2013  . ANTERIOR VITRECTOMY Left 11/16/2015   Procedure: ANTERIOR VITRECTOMY;  Surgeon: Eulogio Bear, MD;  Location: [facility] ORS;  Service: Ophthalmology;  Laterality: Left;  . BLADDER SURGERY    . CATARACT  EXTRACTION W/PHACO Left 11/16/2015   Procedure: CATARACT EXTRACTION PHACO AND INTRAOCULAR LENS PLACEMENT (IOC);  Surgeon: Eulogio Bear, MD;  Location: [facility] ORS;  Service: Ophthalmology;  Laterality: Left;  Lot # [id number] H [place]; 01:22.8 AP%:12.6 CDE: 10.46  . CATARACT EXTRACTION W/PHACO Right 08/22/2016   Procedure: CATARACT EXTRACTION PHACO AND INTRAOCULAR LENS PLACEMENT (IOC);  Surgeon: Eulogio Bear, MD;  Location: [facility] ORS;  Service: Ophthalmology;  Laterality: Right;  Lot # [id number] H [place]: 00:52.1 AP%:8.7 CDE: 4.99   . CORONARY ANGIOPLASTY     STENT  . CORONARY ARTERY BYPASS GRAFT    . MASTECTOMY    . TUMOR REMOVAL     ABDOMINAL    Family History  Problem Relation Age of Onset  . Sudden death Mother   . Arthritis Father   . Stroke Father   . Breast cancer Sister   . Diabetes Grandchild     Allergies  Allergen Reactions  . Amoxicillin Hives  . Gabapentin Other (See Comments)    AMS  . Mirtazapine Itching  . Other     UNKNOWN PAIN MEDICATION - unknown reaction    Current Outpatient Medications on File Prior to Visit  Medication Sig Dispense Refill  . acetaminophen (TYLENOL) 500 MG tablet Take 1 tablet (500 mg total) by mouth every 6 (six) hours as needed. 30 tablet 0  . amLODipine (NORVASC) 10 MG tablet TAKE ONE TABLET BY MOUTH ONCE DAILY 90 tablet 3  . aspirin EC 81 MG tablet Take 1 tablet (81 mg total) by mouth daily. 30 tablet 5  . atorvastatin (LIPITOR) 40 MG tablet TAKE ONE TABLET BY MOUTH ONCE DAILY 90 tablet 3  . blood glucose meter kit and supplies KIT Dispense based on patient and insurance preference. Use up to four times daily as directed. (FOR ICD-9 250.00, 250.01). 1 each 0  . carvedilol (COREG) 12.5 MG tablet Take 1 tablet (12.5 mg total) by mouth 2 (two) times daily with a meal. 180 tablet 3  . clopidogrel (PLAVIX) 75 MG tablet Take 1 tablet (75 mg total) by mouth daily. 90 tablet 3  . fluticasone (FLONASE) 50 MCG/ACT nasal spray Place into both nostrils  daily.    Marland Kitchen glucose blood (ONE TOUCH TEST STRIPS) test strip Use as instructed twice daily 200 each 3  . hydrALAZINE (APRESOLINE) 10 MG tablet Take 2 tablets (20 mg total) by mouth 3 (three) times daily. 270 tablet 3  . Insulin Glargine (LANTUS SOLOSTAR) 100 UNIT/ML Solostar Pen Inject 4 Units into the skin every evening. 15 mL 11  . Insulin Pen Needle (BD PEN NEEDLE NANO U/F) 32G X 4 MM MISC 1 Syringe by Does not apply route daily. 90 each 3  . lisinopril (PRINIVIL,ZESTRIL) 2.5 MG tablet TAKE 1 TABLET BY MOUTH ONCE DAILY 90 tablet 0  . metFORMIN (GLUCOPHAGE) 1000 MG tablet Take 1 tablet (1,000  mg total) by mouth daily. 90 tablet 1  . metoprolol tartrate (LOPRESSOR) 50 MG tablet Take 50 mg by mouth 2 (two) times daily.    . mirtazapine (REMERON) 15 MG tablet Take 1 tablet (15 mg total) by mouth at bedtime. 90 tablet 0  . Multiple Vitamin (MULTIVITAMIN) tablet Take 1 tablet by mouth daily.    . ONE TOUCH LANCETS MISC Use as directed 2 times per day 200 each 3  . pantoprazole (PROTONIX) 40 MG tablet Take 1 tablet (40 mg total) by mouth 2 (two) times daily before a meal. (Patient taking differently: Take 40 mg by mouth daily. ) 180 tablet 0   No current facility-administered medications on file prior to visit.     BP (!) 158/60   Pulse (!) 59   Temp 97.6 F (36.4 C) (Oral)   Resp 16   Ht 5' (1.524 m)   Wt 83 lb 8 oz (37.9 kg)   SpO2 95%   BMI 16.31 kg/m       Objective:   Physical Exam  Constitutional: She is oriented to person, place, and time.  Thin, appears optimally nourished female in a wheelchair  Eyes: Pupils are equal, round, and reactive to light. No scleral icterus.  Neck: Neck supple.  Cardiovascular: Normal rate, regular rhythm and intact distal pulses.  Intact right distal pulse. Left AKA present  Pulmonary/Chest: Effort normal and breath sounds normal. She has no wheezes. She has no rales.  Abdominal: Soft. Bowel sounds are normal. There is no tenderness. There is no  rebound.  Musculoskeletal: She exhibits no edema.  Lymphadenopathy:    She has no cervical adenopathy.  Neurological: She is alert and oriented to person, place, and time.  Skin: Skin is warm and dry. No rash noted.  Psychiatric: She has a normal mood and affect. Her behavior is normal.       Assessment & Plan:  1. Essential hypertension Elevation of systolic BP present. Diastolic on the low side. Exam and neuro exam are reassuring. No signs and symptoms or HTN urgency or emergency. Suspect that elevated reading at home may be due to non adherence to medication. She reports that she was non adherent to hydralazine TID as ordered by provider. We reviewed medications (appropriate dose, timing) and monitoring of blood pressure at home.  She will use medication boxes for daily meds and her daughter will check her BP daily and document readings. Advised her to follow up in two weeks with blood pressure readings or sooner if blood pressure is elevated after taking her medications as prescribed.  Reviewed with her previous recommendations by her provider that a consult to cardiology can be considered for further hypertension management if BP readings at home are elevated.  Reviewed the importance of seeking immediate medical attention if she experiences symptoms of chest pain, SOB, numbness, tingling, weakness, or headaches.  2. Left leg pain Chronic in nature; provided a refill of zanaflex with a discussion of using this sparingly as this can be sedating. Further advised her to follow up her provider if symptoms persist.   Follow up in two weeks or sooner if needed. Will also consider a consult with pharmacist to review medications. Patient voiced understanding and agreed with plan.  Delano Metz, FNP-C

## 2017-06-23 NOTE — Patient Instructions (Addendum)
Please take hydralazine three times daily as prescribed by your provider. Please ask your daughter to take your blood pressure and write down the readings once daily for two weeks.  Please schedule a follow up with your provider in two weeks and bring blood pressure readings with you for review or sooner if needed.  Zanaflex has been provided for you to take at night for pain in your leg. This medication can make you sleepy so please use only as needed.  Follow up in 2 weeks or sooner.

## 2017-06-24 ENCOUNTER — Other Ambulatory Visit: Payer: Self-pay | Admitting: Family

## 2017-06-24 DIAGNOSIS — E785 Hyperlipidemia, unspecified: Secondary | ICD-10-CM

## 2017-06-26 ENCOUNTER — Ambulatory Visit: Payer: Self-pay | Admitting: *Deleted

## 2017-06-26 NOTE — Telephone Encounter (Signed)
Patient was supposed to be having a dental procedure done today but was not able to do so since she is on blood thinner. Per patient, dentist recommended that she stop for 3 days but wanted her to call PCP for specific instructions as in when to stop/start plavix.

## 2017-06-26 NOTE — Telephone Encounter (Signed)
Called pt regarding medication question; pt is having a dental procedure and has questions regarding stopping her plavix; will route to [place] regarding pt request; pt can be contacted at [phone].   Reason for Disposition . Caller has URGENT medication question about med that PCP prescribed and triager unable to answer question  Answer Assessment - Initial Assessment Questions 1. SYMPTOMS: "Do you have any symptoms?"     no 2. SEVERITY: If symptoms are present, ask "Are they mild, moderate or severe?"     no  Protocols used: MEDICATION QUESTION CALL-A-AH

## 2017-06-26 NOTE — Telephone Encounter (Signed)
Patient is aware and gave verbal understanding.

## 2017-06-26 NOTE — Telephone Encounter (Signed)
suspending the plavix for 3 days is recommended by me as well.

## 2017-07-10 ENCOUNTER — Ambulatory Visit: Payer: Medicare Other | Admitting: Family Medicine

## 2017-07-17 ENCOUNTER — Ambulatory Visit (INDEPENDENT_AMBULATORY_CARE_PROVIDER_SITE_OTHER): Payer: Medicare Other | Admitting: Family Medicine

## 2017-07-17 ENCOUNTER — Encounter: Payer: Self-pay | Admitting: Family Medicine

## 2017-07-17 VITALS — BP 136/58 | HR 68 | Temp 97.5°F | Wt 83.0 lb

## 2017-07-17 DIAGNOSIS — I10 Essential (primary) hypertension: Secondary | ICD-10-CM

## 2017-07-17 DIAGNOSIS — Z794 Long term (current) use of insulin: Secondary | ICD-10-CM | POA: Diagnosis not present

## 2017-07-17 DIAGNOSIS — E1129 Type 2 diabetes mellitus with other diabetic kidney complication: Secondary | ICD-10-CM | POA: Diagnosis not present

## 2017-07-17 NOTE — Progress Notes (Signed)
Subjective:    Patient ID: Tiffany Bailey, female    DOB: [birth year], 84 y.o.   MRN: [id number]  HPI  Tiffany Bailey is an 84 year old female who presents today for follow up of elevated blood pressure.  She was evaluated on 07/17/17 and reported that her daughter takes her BP sporadically and noted systolic readings in the 563S at that time. She reported nonadherence to hydralazine TID which was likely reason for increase in BP. She was also was unclear regarding if she had taken all of her medications and appropriate timing of meds.  Prior to 06/19/17 visit, she was evaluated for this recurrent issue; diastolic readings were noted to be on the low side as consistently in the 60s. Her PCP discussed cardiology consult with her if BP remains elevated with current management.   Today, she denies chest pain, palpitations, SOB, numbness, tingling, weakness, headaches, or edema. Current medications include amlodipine 10 mg daily, carvedilol 12.5 mg BID, hydralazine 20 mg TID, and Lisinopril 2.5 mg daily. She reports adherence to her medications today.  Mild rhinitis is present today. Associated clear mucous has been present greater than one month. Mild decrease in appetite. She reports drinking very little water and will also drink one coca cola a day. She reports that these symptoms have been present intermittently for one year but have worsened today. When nasal passage is dry she reports mild irritation which can rarely causes  "little blood on a tissue". She denies nose bleed today. She takes plavix. No abnormal bruising, bleeding, hemoptysis, hematochezia, or melena noted.   Treatment with vaseline in nose has provided benefit.  History of nonadherence with insulin or checking blood sugars noted. She reports that she "knows how and when she needs medication.". On 05/21/17, she was evaluated and noted that she had stopped taking her insulin at that time also as she was having low sugars and not feeling  well. In office blood sugar on 05/21/17 was 303. Lab Results  Component Value Date   HGBA1C 8.7 (H) 04/08/2017  She was encouraged to check her blood sugar and provided testing supplies and refills of insulin at that time.   She denies polyphagia, polyuria, or polydipsia today. She denies any recent lows however also does not obtain regular readings. Weight has been stable since 04/08/17. History of left AKA.   Wt Readings from Last 3 Encounters:  07/17/17 83 lb (37.6 kg)  06/23/17 83 lb 8 oz (37.9 kg)  04/08/17 85 lb (38.6 kg)    Review of Systems  Constitutional: Negative for chills, fatigue and fever.  HENT: Positive for postnasal drip and rhinorrhea. Negative for congestion, ear pain, sinus pressure, sinus pain, sneezing, sore throat and tinnitus.   Eyes: Positive for itching. Negative for visual disturbance.  Respiratory: Negative for cough, shortness of breath and wheezing.   Cardiovascular: Negative for chest pain and palpitations.  Gastrointestinal: Negative for abdominal pain, constipation, diarrhea, nausea and vomiting.  Genitourinary: Negative for dysuria.  Skin: Negative for rash.  Neurological: Negative for dizziness, weakness, light-headedness and headaches.   Past Medical History:  Diagnosis Date  . Anemia   . Anginal pain ([place])   . Arthritis   . Cancer (HCC)    BREAST  . CKD (chronic kidney disease), stage III ([place])   . Coronary artery disease    S/P CABG and multiple PCI's  . Diabetes mellitus without complication ([place])   . Edema   . Failure to thrive in adult   .  GERD (gastroesophageal reflux disease)   . History of breast cancer   . HOH (hard of hearing)   . Hyperlipidemia   . Hypertension   . Myocardial infarction ([place])   . Palpitations      Social History   Socioeconomic History  . Marital status: Divorced    Spouse name: Not on file  . Number of children: Not on file  . Years of education: Not on file  . Highest education level: Not on file    Social Needs  . Financial resource strain: Not on file  . Food insecurity - worry: Not on file  . Food insecurity - inability: Not on file  . Transportation needs - medical: Not on file  . Transportation needs - non-medical: Not on file  Occupational History  . Not on file  Tobacco Use  . Smoking status: Never Smoker  . Smokeless tobacco: Never Used  Substance and Sexual Activity  . Alcohol use: No  . Drug use: No  . Sexual activity: Not Currently  Other Topics Concern  . Not on file  Social History Narrative  . Not on file    Past Surgical History:  Procedure Laterality Date  . ABDOMINAL HYSTERECTOMY    . ABOVE KNEE LEG AMPUTATION Left 2013  . ANTERIOR VITRECTOMY Left 11/16/2015   Procedure: ANTERIOR VITRECTOMY;  Surgeon: Eulogio Bear, MD;  Location: [facility] ORS;  Service: Ophthalmology;  Laterality: Left;  . BLADDER SURGERY    . CATARACT EXTRACTION W/PHACO Left 11/16/2015   Procedure: CATARACT EXTRACTION PHACO AND INTRAOCULAR LENS PLACEMENT (IOC);  Surgeon: Eulogio Bear, MD;  Location: [facility] ORS;  Service: Ophthalmology;  Laterality: Left;  Lot # [id number] H [place]; 01:22.8 AP%:12.6 CDE: 10.46  . CATARACT EXTRACTION W/PHACO Right 08/22/2016   Procedure: CATARACT EXTRACTION PHACO AND INTRAOCULAR LENS PLACEMENT (IOC);  Surgeon: Eulogio Bear, MD;  Location: [facility] ORS;  Service: Ophthalmology;  Laterality: Right;  Lot # [id number] H [place]: 00:52.1 AP%:8.7 CDE: 4.99   . CORONARY ANGIOPLASTY     STENT  . CORONARY ARTERY BYPASS GRAFT    . MASTECTOMY    . TUMOR REMOVAL     ABDOMINAL    Family History  Problem Relation Age of Onset  . Sudden death Mother   . Arthritis Father   . Stroke Father   . Breast cancer Sister   . Diabetes Grandchild     Allergies  Allergen Reactions  . Amoxicillin Hives  . Gabapentin Other (See Comments)    AMS  . Mirtazapine Itching  . Other     UNKNOWN PAIN MEDICATION - unknown reaction    Current Outpatient Medications on File Prior to  Visit  Medication Sig Dispense Refill  . acetaminophen (TYLENOL) 500 MG tablet Take 1 tablet (500 mg total) by mouth every 6 (six) hours as needed. 30 tablet 0  . amLODipine (NORVASC) 10 MG tablet TAKE ONE TABLET BY MOUTH ONCE DAILY 90 tablet 3  . aspirin EC 81 MG tablet Take 1 tablet (81 mg total) by mouth daily. 30 tablet 5  . atorvastatin (LIPITOR) 40 MG tablet TAKE 1 TABLET BY MOUTH  DAILY 90 tablet 0  . blood glucose meter kit and supplies KIT Dispense based on patient and insurance preference. Use up to four times daily as directed. (FOR ICD-9 250.00, 250.01). 1 each 0  . carvedilol (COREG) 12.5 MG tablet Take 1 tablet (12.5 mg total) by mouth 2 (two) times daily with a meal. 180 tablet 3  . clopidogrel (PLAVIX)  75 MG tablet Take 1 tablet (75 mg total) by mouth daily. 90 tablet 3  . glucose blood (ONE TOUCH TEST STRIPS) test strip Use as instructed twice daily 200 each 3  . hydrALAZINE (APRESOLINE) 10 MG tablet Take 2 tablets (20 mg total) by mouth 3 (three) times daily. 270 tablet 3  . Insulin Pen Needle (BD PEN NEEDLE NANO U/F) 32G X 4 MM MISC 1 Syringe by Does not apply route daily. 90 each 3  . lisinopril (PRINIVIL,ZESTRIL) 2.5 MG tablet TAKE 1 TABLET BY MOUTH ONCE DAILY 90 tablet 0  . metFORMIN (GLUCOPHAGE) 1000 MG tablet Take 1 tablet (1,000 mg total) by mouth daily. 90 tablet 1  . metoprolol tartrate (LOPRESSOR) 50 MG tablet Take 50 mg by mouth 2 (two) times daily.    . mirtazapine (REMERON) 15 MG tablet Take 1 tablet (15 mg total) by mouth at bedtime. 90 tablet 0  . Multiple Vitamin (MULTIVITAMIN) tablet Take 1 tablet by mouth daily.    . ONE TOUCH LANCETS MISC Use as directed 2 times per day 200 each 3  . pantoprazole (PROTONIX) 40 MG tablet Take 1 tablet (40 mg total) by mouth 2 (two) times daily before a meal. (Patient taking differently: Take 40 mg by mouth daily. ) 180 tablet 0  . tizanidine (ZANAFLEX) 2 MG capsule Take 1 capsule (2 mg total) by mouth 3 (three) times daily. 30  capsule 0  . Insulin Glargine (LANTUS SOLOSTAR) 100 UNIT/ML Solostar Pen Inject 4 Units into the skin every evening. (Patient not taking: Reported on 07/17/2017) 15 mL 11   No current facility-administered medications on file prior to visit.     BP (!) 136/58   Pulse 68   Temp (!) 97.5 F (36.4 C) (Oral)   Wt 83 lb (37.6 kg)   SpO2 95%   BMI 16.21 kg/m       Objective:   Physical Exam  Constitutional: She is oriented to person, place, and time.  Thin, optimally nourished female in a wheelchair  HENT:  Right Ear: Tympanic membrane normal.  Left Ear: Tympanic membrane normal.  Nose: Rhinorrhea present. Right sinus exhibits no maxillary sinus tenderness and no frontal sinus tenderness. Left sinus exhibits no maxillary sinus tenderness and no frontal sinus tenderness.  Mouth/Throat: Oropharynx is clear and moist.  Eyes: Pupils are equal, round, and reactive to light. No scleral icterus.  Neck: Neck supple.  Cardiovascular: Normal rate and regular rhythm.  Pulmonary/Chest: Effort normal and breath sounds normal. She has no wheezes. She has no rales.  Abdominal: Soft. Bowel sounds are normal. There is no tenderness. There is no rebound.  Lymphadenopathy:    She has no cervical adenopathy.  Neurological: She is alert and oriented to person, place, and time.  Skin: Skin is warm and dry. No rash noted.  Psychiatric: She has a normal mood and affect. Her behavior is normal.       Assessment & Plan:  1. Essential hypertension Controlled; no changes in medication today; suspect that fluctuations are due to nonadherence to regimen. Reviewed medications with patient and advised her to consider accepting help with medication boxes that can be filled for daily use. Further recommended that she continue monitoring her blood pressure and bring readings with her to follow up visit and report any readings >140/90.  2. Type 2 diabetes mellitus with other diabetic kidney complication, with  long-term current use of insulin (HCC) Declined CBG or blood work in office today. We discussed my concern that  she is not checking her blood sugar regularly and reports that she may or may not not take her insulin as prescribed.  Encouraged her to check her blood sugars and reviewed her medications with her today.  Provided her with a glucometer and strips and asked her to check her blood sugars fasting, keep a log, and follow up with her PCP and pharmacist in office to review readings and assess need for titration of insulin. Offered to check her blood sugar again and she politely declined stating she will follow up as directed. Appointment made to meet with pharmacist on Monday, 07/22/17 to review blood sugar log and insulin use. Follow up made with PCP in 3 weeks.  I spent 25 minutes with this patient and greater than 50% of the time was spent in face to face counseling regarding insulin, diabetes and associated complications such as nephropathy, neuropathy, and retinopathy. Further discussed signs/symptoms of hypo/hyperglycemia and importance of follow up for further evaluation of blood sugar readings and insulin titration if needed.   Reviewed medications, signs/symptoms of hypoglycemia and also the importance of keeping a sugar source if she has symptoms of low sugar.  Tiffany Metz, FNP-C

## 2017-07-17 NOTE — Patient Instructions (Addendum)
Please pick up saline nasal gel that can be used daily for symptoms of dryness in your nose.  Continue blood pressure medications as prescribed and continue monitoring your blood pressure and bring these readings with you to your next visit with Joycelyn Schmid.  Please increase your water intake and eat small frequent meals.  Resume checking your blood sugar and bring your blood sugar readings with you to meet with the pharmacist for review of your medication and proper dosing of insulin.  Please also keep a source of sugar such as orange juice for any symptoms of low blood sugar. See information below for symptoms of low blood sugar  An appointment has been made for you to bring your blood sugar logs for the pharmacist to review with you.a  A scheduled follow up with your provider has also been scheduled.     Hypoglycemia Hypoglycemia is when the sugar (glucose) level in the blood is too low. Symptoms of low blood sugar may include:  Feeling: ? Hungry. ? Worried or nervous (anxious). ? Sweaty and clammy. ? Confused. ? Dizzy. ? Sleepy. ? Sick to your stomach (nauseous).  Having: ? A fast heartbeat. ? A headache. ? A change in your vision. ? Jerky movements that you cannot control (seizure). ? Nightmares. ? Tingling or no feeling (numbness) around the mouth, lips, or tongue.  Having trouble with: ? Talking. ? Paying attention (concentrating). ? Moving (coordination). ? Sleeping.  Shaking.  Passing out (fainting).  Getting upset easily (irritability).  Low blood sugar can happen to people who have diabetes and people who do not have diabetes. Low blood sugar can happen quickly, and it can be an emergency. Treating Low Blood Sugar Low blood sugar is often treated by eating or drinking something sugary right away. If you can think clearly and swallow safely, follow the 15:15 rule:  Take 15 grams of a fast-acting carb (carbohydrate). Some fast-acting carbs are: ? 1 tube of  glucose gel. ? 3 sugar tablets (glucose pills). ? 6-8 pieces of hard candy. ? 4 oz (120 mL) of fruit juice. ? 4 oz (120 mL) of regular (not diet) soda.  Check your blood sugar 15 minutes after you take the carb.  If your blood sugar is still at or below 70 mg/dL (3.9 mmol/L), take 15 grams of a carb again.  If your blood sugar does not go above 70 mg/dL (3.9 mmol/L) after 3 tries, get help right away.  After your blood sugar goes back to normal, eat a meal or a snack within 1 hour.  Treating Very Low Blood Sugar If your blood sugar is at or below 54 mg/dL (3 mmol/L), you have very low blood sugar (severe hypoglycemia). This is an emergency. Do not wait to see if the symptoms will go away. Get medical help right away. Call your local emergency services (911 in the [place] Do not drive yourself to the hospital. If you have very low blood sugar and you cannot eat or drink, you may need a glucagon shot (injection). A family member or friend should learn how to check your blood sugar and how to give you a glucagon shot. Ask your doctor if you need to have a glucagon shot kit at home. Follow these instructions at home: General instructions  Avoid any diets that cause you to not eat enough food. Talk with your doctor before you start any new diet.  Take over-the-counter and prescription medicines only as told by your doctor.  Limit alcohol to  no more than 1 drink per day for nonpregnant women and 2 drinks per day for men. One drink equals 12 oz of beer, 5 oz of wine, or 1 oz of hard liquor.  Keep all follow-up visits as told by your doctor. This is important. If You Have Diabetes:   Make sure you know the symptoms of low blood sugar.  Always keep a source of sugar with you, such as: ? Sugar. ? Sugar tablets. ? Glucose gel. ? Fruit juice. ? Regular soda (not diet soda). ? Milk. ? Hard candy. ? Honey.  Take your medicines as told.  Follow your exercise and meal plan. ? Eat on  time. Do not skip meals. ? Follow your sick day plan when you cannot eat or drink normally. Make this plan ahead of time with your doctor.  Check your blood sugar as often as told by your doctor. Always check before and after exercise.  Share your diabetes care plan with: ? Your work or school. ? People you live with.  Check your pee (urine) for ketones: ? When you are sick. ? As told by your doctor.  Carry a card or wear jewelry that says you have diabetes. If You Have Low Blood Sugar From Other Causes:   Check your blood sugar as often as told by your doctor.  Follow instructions from your doctor about what you cannot eat or drink. Contact a doctor if:  You have trouble keeping your blood sugar in your target range.  You have low blood sugar often. Get help right away if:  You still have symptoms after you eat or drink something sugary.  Your blood sugar is at or below 54 mg/dL (3 mmol/L).  You have jerky movements that you cannot control.  You pass out. These symptoms may be an emergency. Do not wait to see if the symptoms will go away. Get medical help right away. Call your local emergency services (911 in the [place] Do not drive yourself to the hospital. This information is not intended to replace advice given to you by your health care provider. Make sure you discuss any questions you have with your health care provider. Document Released: 08/28/2009 Document Revised: 11/09/2015 Document Reviewed: 07/07/2015 Elsevier Interactive Patient Education  [employer].

## 2017-07-19 ENCOUNTER — Other Ambulatory Visit: Payer: Self-pay | Admitting: Family Medicine

## 2017-07-19 DIAGNOSIS — E1151 Type 2 diabetes mellitus with diabetic peripheral angiopathy without gangrene: Secondary | ICD-10-CM

## 2017-07-19 DIAGNOSIS — E1165 Type 2 diabetes mellitus with hyperglycemia: Secondary | ICD-10-CM

## 2017-07-19 DIAGNOSIS — IMO0002 Reserved for concepts with insufficient information to code with codable children: Secondary | ICD-10-CM

## 2017-07-21 ENCOUNTER — Ambulatory Visit (INDEPENDENT_AMBULATORY_CARE_PROVIDER_SITE_OTHER): Payer: Medicare Other | Admitting: Pharmacist

## 2017-07-21 DIAGNOSIS — E1129 Type 2 diabetes mellitus with other diabetic kidney complication: Secondary | ICD-10-CM

## 2017-07-21 DIAGNOSIS — Z794 Long term (current) use of insulin: Secondary | ICD-10-CM

## 2017-07-21 NOTE — Progress Notes (Signed)
Encounter opened in error, patient no showed.

## 2017-07-24 ENCOUNTER — Other Ambulatory Visit: Payer: Self-pay

## 2017-07-24 DIAGNOSIS — E785 Hyperlipidemia, unspecified: Secondary | ICD-10-CM

## 2017-07-24 MED ORDER — AMLODIPINE BESYLATE 10 MG PO TABS: 10.0000 mg | ORAL_TABLET | Freq: Every day | ORAL | 0 refills | Status: DC

## 2017-07-24 MED ORDER — ATORVASTATIN CALCIUM 40 MG PO TABS: 40.0000 mg | ORAL_TABLET | Freq: Every day | ORAL | 0 refills | Status: DC

## 2017-08-20 ENCOUNTER — Ambulatory Visit (INDEPENDENT_AMBULATORY_CARE_PROVIDER_SITE_OTHER): Payer: Medicare Other | Admitting: Family

## 2017-08-20 ENCOUNTER — Encounter: Payer: Self-pay | Admitting: Family

## 2017-08-20 VITALS — BP 142/62 | HR 57 | Temp 98.0°F

## 2017-08-20 DIAGNOSIS — E785 Hyperlipidemia, unspecified: Secondary | ICD-10-CM | POA: Diagnosis not present

## 2017-08-20 DIAGNOSIS — I10 Essential (primary) hypertension: Secondary | ICD-10-CM | POA: Diagnosis not present

## 2017-08-20 DIAGNOSIS — Z794 Long term (current) use of insulin: Secondary | ICD-10-CM

## 2017-08-20 DIAGNOSIS — E1129 Type 2 diabetes mellitus with other diabetic kidney complication: Secondary | ICD-10-CM | POA: Diagnosis not present

## 2017-08-20 NOTE — Assessment & Plan Note (Addendum)
At goal today. She is not on the hydralazine ( has run out) and blood pressure appears well controlled today. She explained she is now taking toprol BID whereas before she had only been taking once per day. Concerned as patient appears to be on two beta blockers. Calling patient as I realized this after she left. Also advised to f/u appt for med rec to ensure we do not have other duplicates.

## 2017-08-20 NOTE — Progress Notes (Signed)
Subjective:    Patient ID: Tiffany Bailey, female    DOB: [birth year], 84 y.o.   MRN: [id number]  CC: Tiffany Bailey is a 84 y.o. female who presents today for follow up.   HPI: Accompanied by son  No complaints today  DM- Not eating well , and 'not always hungry' and had hypoglycemic episodes-she has since stopped the lantus and episodes have resolved. Reports occasional sugars 80, 90, these were FBG. Not taking lantus. Taking metformin.   HTN- compliant with medication however not taking hydralazine since ran out for one week. Notes she was taking Denies exertional chest pain or pressure, numbness or tingling radiating to left arm or jaw, palpitations, dizziness, frequent headaches, changes in vision, or shortness of breath.      No showed with caroline for DM  Saw Almyra Free 06/2017  Declines dexa scan- now and in future HISTORY:  Past Medical History:  Diagnosis Date  . Anemia   . Anginal pain ([place])   . Arthritis   . Cancer (HCC)    BREAST  . CKD (chronic kidney disease), stage III ([employer])   . Coronary artery disease    S/P CABG and multiple PCI's  . Diabetes mellitus without complication ([place])   . Edema   . Failure to thrive in adult   . GERD (gastroesophageal reflux disease)   . History of breast cancer   . HOH (hard of hearing)   . Hyperlipidemia   . Hypertension   . Myocardial infarction ([place])   . Palpitations    Past Surgical History:  Procedure Laterality Date  . ABDOMINAL HYSTERECTOMY    . ABOVE KNEE LEG AMPUTATION Left 2013  . ANTERIOR VITRECTOMY Left 11/16/2015   Procedure: ANTERIOR VITRECTOMY;  Surgeon: Eulogio Bear, MD;  Location: [facility] ORS;  Service: Ophthalmology;  Laterality: Left;  . BLADDER SURGERY    . CATARACT EXTRACTION W/PHACO Left 11/16/2015   Procedure: CATARACT EXTRACTION PHACO AND INTRAOCULAR LENS PLACEMENT (IOC);  Surgeon: Eulogio Bear, MD;  Location: [facility] ORS;  Service: Ophthalmology;  Laterality: Left;  Lot # [id number] H [place];  01:22.8 AP%:12.6 CDE: 10.46  . CATARACT EXTRACTION W/PHACO Right 08/22/2016   Procedure: CATARACT EXTRACTION PHACO AND INTRAOCULAR LENS PLACEMENT (IOC);  Surgeon: Eulogio Bear, MD;  Location: [facility] ORS;  Service: Ophthalmology;  Laterality: Right;  Lot # [id number] H [place]: 00:52.1 AP%:8.7 CDE: 4.99   . CORONARY ANGIOPLASTY     STENT  . CORONARY ARTERY BYPASS GRAFT    . MASTECTOMY    . TUMOR REMOVAL     ABDOMINAL   Family History  Problem Relation Age of Onset  . Sudden death Mother   . Arthritis Father   . Stroke Father   . Breast cancer Sister   . Diabetes Grandchild     Allergies: Amoxicillin; Gabapentin; Mirtazapine; and Other Current Outpatient Medications on File Prior to Visit  Medication Sig Dispense Refill  . acetaminophen (TYLENOL) 500 MG tablet Take 1 tablet (500 mg total) by mouth every 6 (six) hours as needed. 30 tablet 0  . amLODipine (NORVASC) 10 MG tablet Take 1 tablet (10 mg total) by mouth daily. 90 tablet 0  . aspirin EC 81 MG tablet Take 1 tablet (81 mg total) by mouth daily. 30 tablet 5  . atorvastatin (LIPITOR) 40 MG tablet Take 1 tablet (40 mg total) by mouth daily. 90 tablet 0  . BD PEN NEEDLE NANO U/F 32G X 4 MM MISC USE 1 DAILY 90 each 1  .  blood glucose meter kit and supplies KIT Dispense based on patient and insurance preference. Use up to four times daily as directed. (FOR ICD-9 250.00, 250.01). 1 each 0  . carvedilol (COREG) 12.5 MG tablet Take 1 tablet (12.5 mg total) by mouth 2 (two) times daily with a meal. 180 tablet 3  . clopidogrel (PLAVIX) 75 MG tablet Take 1 tablet (75 mg total) by mouth daily. 90 tablet 3  . glucose blood (ONE TOUCH TEST STRIPS) test strip Use as instructed twice daily 200 each 3  . lisinopril (PRINIVIL,ZESTRIL) 2.5 MG tablet TAKE 1 TABLET BY MOUTH ONCE DAILY 90 tablet 0  . metFORMIN (GLUCOPHAGE) 1000 MG tablet Take 1 tablet (1,000 mg total) by mouth daily. 90 tablet 1  . metoprolol tartrate (LOPRESSOR) 50 MG tablet Take 50 mg  by mouth 2 (two) times daily.    . mirtazapine (REMERON SOL-TAB) 15 MG disintegrating tablet DISSOLVE 1 TABLET ON THE  TONGUE AT BEDTIME 90 tablet 1  . Multiple Vitamin (MULTIVITAMIN) tablet Take 1 tablet by mouth daily.    . ONE TOUCH LANCETS MISC Use as directed 2 times per day 200 each 3  . pantoprazole (PROTONIX) 40 MG tablet Take 1 tablet (40 mg total) by mouth 2 (two) times daily before a meal. (Patient taking differently: Take 40 mg by mouth daily. ) 180 tablet 0  . tizanidine (ZANAFLEX) 2 MG capsule Take 1 capsule (2 mg total) by mouth 3 (three) times daily. 30 capsule 0   No current facility-administered medications on file prior to visit.     Social History   Tobacco Use  . Smoking status: Never Smoker  . Smokeless tobacco: Never Used  Substance Use Topics  . Alcohol use: No  . Drug use: No    Review of Systems  Constitutional: Negative for chills and fever.  Respiratory: Negative for cough.   Cardiovascular: Negative for chest pain and palpitations.  Gastrointestinal: Negative for nausea and vomiting.  Endocrine: Negative for polydipsia, polyphagia and polyuria.      Objective:    BP (!) 142/62 (BP Location: Left Arm, Patient Position: Sitting, Cuff Size: Normal)   Pulse (!) 57   Temp 98 F (36.7 C) (Oral)   SpO2 98%  BP Readings from Last 3 Encounters:  08/20/17 (!) 142/62  07/17/17 (!) 136/58  06/23/17 (!) 158/60   Wt Readings from Last 3 Encounters:  07/17/17 83 lb (37.6 kg)  06/23/17 83 lb 8 oz (37.9 kg)  04/08/17 85 lb (38.6 kg)    Physical Exam  Constitutional: She appears well-developed and well-nourished.  Eyes: Conjunctivae are normal.  Cardiovascular: Normal rate, regular rhythm, normal heart sounds and normal pulses.  Pulmonary/Chest: Effort normal and breath sounds normal. She has no wheezes. She has no rhonchi. She has no rales.  Neurological: She is alert.  Skin: Skin is warm and dry.  Psychiatric: She has a normal mood and affect. Her  speech is normal and behavior is normal. Thought content normal.  Vitals reviewed.      Assessment & Plan:   Problem List Items Addressed This Visit      Cardiovascular and Mediastinum   Essential hypertension    At goal today. She is not on the hydralazine ( has run out) and blood pressure appears well controlled today. She explained she is now taking toprol BID whereas before she had only been taking once per day. Concerned as patient appears to be on two beta blockers. Calling patient as I realized this  after she left. Also advised to f/u appt for med rec to ensure we do not have other duplicates.       Relevant Orders   Comprehensive metabolic panel   Hemoglobin A1c     Endocrine   DM (diabetes mellitus) ([place]) - Primary    Pending a1c. Patient is not taking lantus due to hypoglycemia. She has not seen Chrys Racer as recommended. Close follow up.       Relevant Orders   Hemoglobin A1c     Other   Hyperlipidemia   Relevant Orders   Lipid panel       I have discontinued Lilyan Gilford Insulin Glargine and hydrALAZINE. I am also having her maintain her metFORMIN, carvedilol, ONE TOUCH LANCETS, multivitamin, clopidogrel, pantoprazole, blood glucose meter kit and supplies, acetaminophen, aspirin EC, lisinopril, glucose blood, metoprolol tartrate, tizanidine, BD PEN NEEDLE NANO U/F, mirtazapine, atorvastatin, and amLODipine.   No orders of the defined types were placed in this encounter.   Return precautions given.   Risks, benefits, and alternatives of the medications and treatment plan prescribed today were discussed, and patient expressed understanding.   Education regarding symptom management and diagnosis given to patient on AVS.  Continue to follow with [name], FNP for routine health maintenance.   Tiffany Bailey and I agreed with plan.   [name], FNP

## 2017-08-20 NOTE — Patient Instructions (Addendum)
Next appt - bring all medications to next visit for medication reconcilitation  Buy a new BP cuff.   May hold hydralazine FOR NOW however you need to check blood pressure to ensure running less than 140/80. Please call me if not and we will restart the hydralazine.    Managing Your Hypertension Hypertension is commonly called high blood pressure. This is when the force of your blood pressing against the walls of your arteries is too strong. Arteries are blood vessels that carry blood from your heart throughout your body. Hypertension forces the heart to work harder to pump blood, and may cause the arteries to become narrow or stiff. Having untreated or uncontrolled hypertension can cause heart attack, stroke, kidney disease, and other problems. What are blood pressure readings? A blood pressure reading consists of a higher number over a lower number. Ideally, your blood pressure should be below 120/80. The first ("top") number is called the systolic pressure. It is a measure of the pressure in your arteries as your heart beats. The second ("bottom") number is called the diastolic pressure. It is a measure of the pressure in your arteries as the heart relaxes. What does my blood pressure reading mean? Blood pressure is classified into four stages. Based on your blood pressure reading, your health care provider may use the following stages to determine what type of treatment you need, if any. Systolic pressure and diastolic pressure are measured in a unit called mm Hg. Normal  Systolic pressure: below 443.  Diastolic pressure: below 80. Elevated  Systolic pressure: 154-008.  Diastolic pressure: below 80. Hypertension stage 1  Systolic pressure: 676-195.  Diastolic pressure: 09-32. Hypertension stage 2  Systolic pressure: 671 or above.  Diastolic pressure: 90 or above. What health risks are associated with hypertension? Managing your hypertension is an important responsibility.  Uncontrolled hypertension can lead to:  A heart attack.  A stroke.  A weakened blood vessel (aneurysm).  Heart failure.  Kidney damage.  Eye damage.  Metabolic syndrome.  Memory and concentration problems.  What changes can I make to manage my hypertension? Hypertension can be managed by making lifestyle changes and possibly by taking medicines. Your health care provider will help you make a plan to bring your blood pressure within a normal range. Eating and drinking  Eat a diet that is high in fiber and potassium, and low in salt (sodium), added sugar, and fat. An example eating plan is called the DASH (Dietary Approaches to Stop Hypertension) diet. To eat this way: ? Eat plenty of fresh fruits and vegetables. Try to fill half of your plate at each meal with fruits and vegetables. ? Eat whole grains, such as whole wheat pasta, brown rice, or whole grain bread. Fill about one quarter of your plate with whole grains. ? Eat low-fat diary products. ? Avoid fatty cuts of meat, processed or cured meats, and poultry with skin. Fill about one quarter of your plate with lean proteins such as fish, chicken without skin, beans, eggs, and tofu. ? Avoid premade and processed foods. These tend to be higher in sodium, added sugar, and fat.  Reduce your daily sodium intake. Most people with hypertension should eat less than 1,500 mg of sodium a day.  Limit alcohol intake to no more than 1 drink a day for nonpregnant women and 2 drinks a day for men. One drink equals 12 oz of beer, 5 oz of wine, or 1 oz of hard liquor. Lifestyle  Work with your health  care provider to maintain a healthy body weight, or to lose weight. Ask what an ideal weight is for you.  Get at least 30 minutes of exercise that causes your heart to beat faster (aerobic exercise) most days of the week. Activities may include walking, swimming, or biking.  Include exercise to strengthen your muscles (resistance exercise), such  as weight lifting, as part of your weekly exercise routine. Try to do these types of exercises for 30 minutes at least 3 days a week.  Do not use any products that contain nicotine or tobacco, such as cigarettes and e-cigarettes. If you need help quitting, ask your health care provider.  Control any long-term (chronic) conditions you have, such as high cholesterol or diabetes. Monitoring  Monitor your blood pressure at home as told by your health care provider. Your personal target blood pressure may vary depending on your medical conditions, your age, and other factors.  Have your blood pressure checked regularly, as often as told by your health care provider. Working with your health care provider  Review all the medicines you take with your health care provider because there may be side effects or interactions.  Talk with your health care provider about your diet, exercise habits, and other lifestyle factors that may be contributing to hypertension.  Visit your health care provider regularly. Your health care provider can help you create and adjust your plan for managing hypertension. Will I need medicine to control my blood pressure? Your health care provider may prescribe medicine if lifestyle changes are not enough to get your blood pressure under control, and if:  Your systolic blood pressure is 130 or higher.  Your diastolic blood pressure is 80 or higher.  Take medicines only as told by your health care provider. Follow the directions carefully. Blood pressure medicines must be taken as prescribed. The medicine does not work as well when you skip doses. Skipping doses also puts you at risk for problems. Contact a health care provider if:  You think you are having a reaction to medicines you have taken.  You have repeated (recurrent) headaches.  You feel dizzy.  You have swelling in your ankles.  You have trouble with your vision. Get help right away if:  You develop a  severe headache or confusion.  You have unusual weakness or numbness, or you feel faint.  You have severe pain in your chest or abdomen.  You vomit repeatedly.  You have trouble breathing. Summary  Hypertension is when the force of blood pumping through your arteries is too strong. If this condition is not controlled, it may put you at risk for serious complications.  Your personal target blood pressure may vary depending on your medical conditions, your age, and other factors. For most people, a normal blood pressure is less than 120/80.  Hypertension is managed by lifestyle changes, medicines, or both. Lifestyle changes include weight loss, eating a healthy, low-sodium diet, exercising more, and limiting alcohol. This information is not intended to replace advice given to you by your health care provider. Make sure you discuss any questions you have with your health care provider. Document Released: 02/26/2012 Document Revised: 05/01/2016 Document Reviewed: 05/01/2016 Elsevier Interactive Patient Education  [employer].

## 2017-08-20 NOTE — Assessment & Plan Note (Signed)
Pending a1c. Patient is not taking lantus due to hypoglycemia. She has not seen Chrys Racer as recommended. Close follow up.

## 2017-08-22 ENCOUNTER — Ambulatory Visit: Payer: Medicare Other | Admitting: Family

## 2017-08-27 ENCOUNTER — Other Ambulatory Visit (INDEPENDENT_AMBULATORY_CARE_PROVIDER_SITE_OTHER): Payer: Medicare Other

## 2017-08-27 DIAGNOSIS — E1129 Type 2 diabetes mellitus with other diabetic kidney complication: Secondary | ICD-10-CM | POA: Diagnosis not present

## 2017-08-27 DIAGNOSIS — Z794 Long term (current) use of insulin: Secondary | ICD-10-CM

## 2017-08-27 DIAGNOSIS — I10 Essential (primary) hypertension: Secondary | ICD-10-CM

## 2017-08-27 DIAGNOSIS — E785 Hyperlipidemia, unspecified: Secondary | ICD-10-CM

## 2017-08-27 LAB — LIPID PANEL
CHOLESTEROL: 151 mg/dL (ref 0–200)
HDL: 50.9 mg/dL (ref >39.00)
LDL Cholesterol: 87 mg/dL (ref 0–99)
NonHDL: 100.53
Total CHOL/HDL Ratio: 3
Triglycerides: 66 mg/dL (ref 0.0–149.0)
VLDL: 13.2 mg/dL (ref 0.0–40.0)

## 2017-08-27 LAB — COMPREHENSIVE METABOLIC PANEL
ALBUMIN: 3.9 g/dL (ref 3.5–5.2)
ALK PHOS: 67 U/L (ref 39–117)
ALT: 24 U/L (ref 0–35)
AST: 19 U/L (ref 0–37)
BUN: 19 mg/dL (ref 6–23)
CALCIUM: 9.6 mg/dL (ref 8.4–10.5)
CO2: 26 mEq/L (ref 19–32)
Chloride: 105 mEq/L (ref 96–112)
Creatinine, Ser: 1.14 mg/dL (ref 0.40–1.20)
GFR: 57.92 mL/min — AB (ref >60.00)
Glucose, Bld: 149 mg/dL — ABNORMAL HIGH (ref 70–99)
POTASSIUM: 4.5 meq/L (ref 3.5–5.1)
SODIUM: 138 meq/L (ref 135–145)
Total Bilirubin: 0.4 mg/dL (ref 0.2–1.2)
Total Protein: 6.7 g/dL (ref 6.0–8.3)

## 2017-08-27 LAB — HEMOGLOBIN A1C: HEMOGLOBIN A1C: 9.3 % — AB (ref 4.6–6.5)

## 2017-08-29 ENCOUNTER — Other Ambulatory Visit: Payer: Self-pay | Admitting: Family

## 2017-09-01 ENCOUNTER — Ambulatory Visit (INDEPENDENT_AMBULATORY_CARE_PROVIDER_SITE_OTHER): Payer: Medicare Other | Admitting: Pharmacist

## 2017-09-01 VITALS — BP 166/67 | HR 54

## 2017-09-01 DIAGNOSIS — E1151 Type 2 diabetes mellitus with diabetic peripheral angiopathy without gangrene: Secondary | ICD-10-CM | POA: Diagnosis not present

## 2017-09-01 DIAGNOSIS — E1165 Type 2 diabetes mellitus with hyperglycemia: Secondary | ICD-10-CM | POA: Diagnosis not present

## 2017-09-01 DIAGNOSIS — IMO0002 Reserved for concepts with insufficient information to code with codable children: Secondary | ICD-10-CM

## 2017-09-01 MED ORDER — INSULIN GLARGINE 100 UNITS/ML SOLOSTAR PEN: 2.0000 [IU] | PEN_INJECTOR | Freq: Every day | SUBCUTANEOUS | 3 refills | Status: DC

## 2017-09-01 MED ORDER — INSULIN LISPRO 100 UNIT/ML (KWIKPEN): 2.0000 [IU] | PEN_INJECTOR | Freq: Every day | SUBCUTANEOUS | 11 refills | Status: DC

## 2017-09-01 NOTE — Assessment & Plan Note (Signed)
#  ASCVD risk - secondary prevention in pt with DM, HTN, CKD, stable ASCVD - high intensity statin indicated.  - Continued Aspirin 81 mg  - Continued atorvastatin 40 mg  #Hypertension longstanding and historically very hard to control, limited by diastolic hypotension. Currently uncontrolled however has been improved at last office visits. Patient reports adherence with medication. Control is suboptimal due to resistant HTN, renal disease. - Continued current meds for now. Will revisit at next visit.

## 2017-09-01 NOTE — Progress Notes (Addendum)
S:     Chief Complaint  Patient presents with  . Medication Management    Diabetes    Patient arrives in good spirits, ambulating with wheelchair and presenting with [name] (daughter).  Presents for diabetes evaluation, education, and management at the request of [name], NP (referred on 08/27/17). Last seen by primary care provider on 08/20/2017 - at that time A1C and renal function were found to be worsened in setting on noncompliance to lantus (concern for hypoglycemia), metformin was d/c'd for decreased renal function. Last Rx Clinic visit on 02/24/2018 - at that time patient was being seen for hypertension and medication titration was limited by diastolic blood pressure.   Today, patient brings in all medications for review. Of note, no beta blocker in bag however duplicates on med list (carvedilol and metoprolol, previously identified by PCP). Additionally, no lisinopril. Patient states she forgot the medication that she takes twice a day. Called her mail order pharmacy, they report she takes metoprolol tartrate 50 mg BID. No active Rx carvedilol or lisinopril on file.   Patient denies checking CBGs frequently and reports she has had h/o hypoglycemia (early AM and late evening) on lantus 6-8 units daily. Reports she was checking CBGs in January, fastings in 80s-150s, 2hr post prandials 250s-300s.   Complains of dry mouth with mirtazapine so she is not taking very often for appetite.   Insurance coverage/medication affordability: [facility] Medicare  Patient reports adherence with medications.  Current diabetes medications include: none (stopped metformin after last visit with PCP) Current hypertension medications include: amlodipine 10 mg daily, metoprolol tartrate 50 mg BID   Patient denies hypoglycemic events.  Patient reported dietary habits: Eats 1-2 meals/day Breakfast:fruit OR toast OR cereal Lunch/supper: fried fish or chicken, sides of coleslaw and hush  puppies Snacks:candy Drinks:juice, sodas, little water  Patient reported exercise habits: none at present, limited by amputation and reliance on wheel chair    O:  Physical Exam  Constitutional: She appears cachectic.    Review of Systems  All other systems reviewed and are negative.   Lab Results  Component Value Date   HGBA1C 9.3 (H) 08/27/2017   Vitals:   09/01/17 1033  BP: (!) 166/67  Pulse: (!) 54    Lipid Panel     Component Value Date/Time   CHOL 151 08/27/2017 0927   TRIG 66.0 08/27/2017 0927   HDL 50.90 08/27/2017 0927   CHOLHDL 3 08/27/2017 0927   VLDL 13.2 08/27/2017 0927   LDLCALC 87 08/27/2017 0927   Clinical ASCVD: Yes ;  High-risk conditions: Age 81 or older, h/o coronary bypass or PCI, HTN, CKD  A/P: #Diabetes longstanding currently uncontrolled as evidenced by A1C. Patient denies hypoglycemic events and is able to verbalize appropriate hypoglycemia management plan. Patient reports adherence with medication. Control is suboptimal due to dietary indiscretion, pancreatic insufficiency. Pt demonstrates appropriate priming and administration technique. - Started humalog 2 units with largest meal of the day. Prime with 1 unit prior to use. Patient educated on purpose, proper use and potential adverse effects of humalog.  Following instruction patient verbalized understanding of treatment plan.  - Patient technically still a candidate for metformin with renal function at this time, however I suspect that patient needs insulin replacement and the issue is NOT reduced insulin sensitivity. Therefore, will not restart at this time. - Asked patient to check CBGs BID - Next A1C anticipated 11/27/2017 or later  #ASCVD risk - secondary prevention in pt with DM, HTN, CKD, stable  ASCVD - high intensity statin indicated.  - Continued Aspirin 81 mg  - Continued atorvastatin 40 mg  #Hypertension longstanding and historically very hard to control, limited by diastolic  hypotension. Currently uncontrolled however has been improved at last office visits. Patient reports adherence with medication. Control is suboptimal due to resistant HTN, renal disease. - Continued current meds for now. Will revisit at next visit.  Written patient instructions provided.  Total time in face to face counseling 50 minutes.    Follow up in Pharmacist Clinic Visit 2 weeks on 09/15/17.   Carlean Jews, Pharm.D., BCPS, CPP PGY2 Ambulatory Care Pharmacy Resident Phone: [phone]  Agree with plan. [name], NP

## 2017-09-01 NOTE — Patient Instructions (Addendum)
Thanks for coming to see me.   1. Start Humalog 2 units once a day with your biggest meal  2. Check your blood sugar first thing in the morning and at bedtime or a few hours after you have had a big meal.   3. Try to stay away from juice and soda and candy   Come back to see me on 09/15/2017 at 10AM. Please bring in your meter.

## 2017-09-01 NOTE — Addendum Note (Signed)
Addended by: Deirdre Pippins E on: 09/01/2017 03:45 PM   Modules accepted: Orders

## 2017-09-01 NOTE — Assessment & Plan Note (Signed)
#  Diabetes longstanding currently uncontrolled as evidenced by A1C. Patient denies hypoglycemic events and is able to verbalize appropriate hypoglycemia management plan. Patient reports adherence with medication. Control is suboptimal due to dietary indiscretion, pancreatic insufficiency. Pt demonstrates appropriate priming and administration technique. - Started humalog 2 units with largest meal of the day. Prime with 1 unit prior to use. Patient educated on purpose, proper use and potential adverse effects of humalog.  Following instruction patient verbalized understanding of treatment plan.  - Patient technically still a candidate for metformin with renal function at this time, however I suspect that patient needs insulin replacement and the issue is NOT reduced insulin sensitivity. Therefore, will not restart at this time. - Asked patient to check CBGs BID - Next A1C anticipated 11/27/2017 or later

## 2017-09-02 NOTE — Progress Notes (Signed)
Medications were reconciled with patient, Looking at Caroline's note carvedilol was discontinued.

## 2017-09-03 ENCOUNTER — Telehealth: Payer: Self-pay | Admitting: Family

## 2017-09-03 NOTE — Telephone Encounter (Signed)
Can you call patient and help with this please , Tiffany Bailey wanted to know since you just saw on 09/01/17?

## 2017-09-03 NOTE — Telephone Encounter (Signed)
Copied from [place] [phone]. Topic: Quick Communication - See Telephone Encounter >> Sep 03, 2017 12:22 PM Clack, Laban Emperor wrote: CRM for notification. See Telephone encounter for: Pt states that she is not able to open her  insulin lispro (HUMALOG KWIKPEN) 100 UNIT/ML KiwkPen [[id number]]. And would like to know if a pill form can be called in for her?  [place], [place] - [place] [phone] (Phone) [phone] (Fax)    09/03/17.

## 2017-09-03 NOTE — Telephone Encounter (Addendum)
Called pt, no answer. Pt called right back and states she is having difficulty operating insulin pen. She cannot adequately describe difficulties to me over the phone. Asked why she can't try a pill instead. Explained that she could technically go back on metformin however Joycelyn Schmid was worried about renal function and CrCl/GFR calculations likely are OVER estimating her renal function given her size. Would avoid invokana entirely with h/o amputation. Would avoid GLP1s and SGLT2s with decreased PO intake and low weight at baseline though SGLT2s may be helpful for blood pressure though CKD3 limiting. Want to avoid all long acting sulfonylureas, glipizide IR MAY be an option. Could consider DPPIV inhibitor however suspect this will not be robust enough to lower A1C to goal and suspect problem is LACK of insulin that needs replacement. Explained that I suspect her problem is LACK of insulin secretion given her cachetic state. Suggested patient go to her pharmacy to get help. Patient states she will do that tomorrow.   Carlean Jews, Pharm.D., BCPS PGY2 Ambulatory Care Pharmacy Resident Phone: [phone]

## 2017-09-03 NOTE — Telephone Encounter (Signed)
Please advise 

## 2017-09-04 NOTE — Progress Notes (Signed)
  I have reviewed the above information and agree with above.   Roda Lauture, MD 

## 2017-09-05 NOTE — Telephone Encounter (Addendum)
Spoke with patient still confused on how to administer insulin .  Tried to explain to patient via phone how to administer insulin patient didn't understand her cousin was there with her she stated that "insuling would not come out"  I gave her step by step instructions and she and she still didn't understand.  She uses mail order pharmacy so they wouldn't be able to assist her.  Per Joycelyn Schmid patient needs to come in for nurse visit to be instructed on how to administer insulin .    Appointment scheduled for nurse visit on 09/09/17

## 2017-09-05 NOTE — Telephone Encounter (Signed)
Patient spoke with Hoyle Sauer, pharmacist.  She was going to speak with the pharmacist at her pharmacy.  Please give courtesy call patient make sure she has figured out how to use insulin pen.    If not please have her make a follow-up appointment for nurse visit here at our clinic.

## 2017-09-09 ENCOUNTER — Ambulatory Visit: Payer: Medicare Other

## 2017-09-09 NOTE — Telephone Encounter (Signed)
Tiffany Bailey there has to be order placed in chart in order placed in chart to do insulin teaching.  It can't be verbal order.   Please place order.  Thanks.

## 2017-09-10 NOTE — Telephone Encounter (Signed)
Per Coralee Pesa davis has shown how to use insulin and uncap.   Thank you Juliann Pulse!  Will close

## 2017-09-15 ENCOUNTER — Encounter: Payer: Self-pay | Admitting: Pharmacist

## 2017-09-15 ENCOUNTER — Ambulatory Visit (INDEPENDENT_AMBULATORY_CARE_PROVIDER_SITE_OTHER): Payer: Medicare Other | Admitting: Pharmacist

## 2017-09-15 VITALS — BP 165/64 | HR 55

## 2017-09-15 DIAGNOSIS — E1151 Type 2 diabetes mellitus with diabetic peripheral angiopathy without gangrene: Secondary | ICD-10-CM | POA: Diagnosis not present

## 2017-09-15 DIAGNOSIS — E1165 Type 2 diabetes mellitus with hyperglycemia: Secondary | ICD-10-CM

## 2017-09-15 DIAGNOSIS — IMO0002 Reserved for concepts with insufficient information to code with codable children: Secondary | ICD-10-CM

## 2017-09-15 MED ORDER — INSULIN LISPRO 100 UNIT/ML (KWIKPEN): 4.0000 [IU] | PEN_INJECTOR | Freq: Every day | SUBCUTANEOUS | 11 refills | Status: DC

## 2017-09-15 NOTE — Progress Notes (Addendum)
S:     Chief Complaint  Patient presents with  . Medication Management    Diabetes     Patient arrives in good spirits, ambulating with wheelchair and presenting with Tiffany Bailey (daughter).  Presents for diabetes evaluation, education, and management at the request of [name], NP/Dr. Derrel Nip (referred on 08/27/17). Last seen by primary care provider on 08/20/2017 - at that time A1C and renal function were found to be worsened in setting on noncompliance to lantus (concern for hypoglycemia), metformin was d/c'd for decreased renal function. Last Rx Clinic visit on 09/01/2017 - at that time humalog was started.   Reports she is now able to administer insulin dose immediately prior to meals. Thinks she needs higher dose of Humalog. Reports CBG of ~360 after friend chicken, beets, slaw, potato salad, coca cola. Reports improved medication adherence and she has started taking metoprolol tartrate twice daily. Requests mirtazapine (for appetite stimulation) be changed to tablet (non-disintegrating) as the ODT causes intolerable dry mouth.   Took meds ~1 hour prior to visits. Reports she has a headache today. Denies dizziness, chest pain, falls, syncope. Reports she has a supply of hydralazine at home.   Insurance coverage/medication affordability: denies issue  Patient reports adherence with medications.  Current diabetes medications include: humalog 2 units with largest meal of the day Current hypertension medications include: metoprolol tartrate 50 mg BID, amlodipine 10 mg daily  Patient denies hypoglycemic events.  Patient reported dietary habits: Eats 1 large meal/day  Patient reported exercise habits: none, limited by mobility (wheel chair bound, amputation)    Patient reports nocturia.  3-4 times nightly  Patient denies pain/burning on urination.  Patient reports neuropathy. Patient reports visual changes. Patient reports self foot exams.  Denies issues.   O:  Physical Exam    Constitutional: She appears well-developed and well-nourished.   Review of Systems  All other systems reviewed and are negative.  Lab Results  Component Value Date   HGBA1C 9.3 (H) 08/27/2017   Vitals:   09/15/17 1021 09/15/17 1038  BP: (!) 185/66 (!) 165/64  Pulse: (!) 54 (!) 55    Lipid Panel     Component Value Date/Time   CHOL 151 08/27/2017 0927   TRIG 66.0 08/27/2017 0927   HDL 50.90 08/27/2017 0927   CHOLHDL 3 08/27/2017 0927   VLDL 13.2 08/27/2017 0927   LDLCALC 87 08/27/2017 0927   Quality gaps: Urine microalbumin Home fasting CBG: 120-130 2 hour post-prandial/random CBG: 250s-360s,   Clinical ASCVD: Yes;  High-risk conditions: Age 75 or older, h/o coronary bypass or PCI, HTN, CKD  A/P: #Diabetes longstanding currently uncontrolled as evidenced by A1C. Patient denies hypoglycemic events and is able to verbalize appropriate hypoglycemia management plan. Patient reports adherence with medication. Control is suboptimal due to dietary indiscretion, pancreatic insufficiency. Pt demonstrates appropriate priming and administration technique. - Increased humalog to 4 units with largest meal of the day. Prime with 1 unit prior to use.  - Patient technically still a candidate for metformin with renal function at this time, however I suspect that patient needs insulin replacement and the issue is NOT reduced insulin sensitivity. Therefore, will not restart at this time. - Asked patient to check CBGs BID - Next A1C anticipated 11/27/2017 or later - Offered urine microalbumin today however patient states she doesn't think she can urinate as she went immediately prior to visit. Will attempt to collect at next visit.   #ASCVD risk - secondary prevention in pt with DM, HTN,  CKD, stable ASCVD - high intensity statin indicated.  - Continued Aspirin 81 mg  - Continued atorvastatin 40 mg  #Hypertension longstanding and historically very hard to control, limited by diastolic  pressures. Systolic pressure uncontrolled. Patient reports adherence with medication, last dose ~1 hr ago. Control is suboptimal due to diastolic borderline hypotension, renal disease, dietary indiscretion, timing of last BP medication dose.  - Continued current meds for now. Will continue to assess at each viist  Written patient instructions provided.  Total time in face to face counseling 30 minutes.    Follow up in Pharmacist Clinic Visit 2 weeks on 09/15/17.   Carlean Jews, Pharm.D., BCPS, CPP PGY2 Ambulatory Care Pharmacy Resident Phone: [phone]  Agree with plan. [name], NP

## 2017-09-15 NOTE — Patient Instructions (Addendum)
Thanks for coming to see me. I agree - we need to increase your insulin.   Increase Humalog to 4 units with your largest meal of the day.   Keep checking blood sugar twice a day.   Bring your meter in with you the next time I see you.   Come back in 2 weeks. Call if you need anything [phone]

## 2017-09-15 NOTE — Assessment & Plan Note (Signed)
#  ASCVD risk - secondary prevention in pt with DM, HTN, CKD, stable ASCVD - high intensity statin indicated.  - Continued Aspirin 81 mg  - Continued atorvastatin 40 mg  #Hypertension longstanding and historically very hard to control, limited by diastolic pressures. Systolic pressure uncontrolled. Patient reports adherence with medication, last dose ~1 hr ago. Control is suboptimal due to diastolic borderline hypotension, renal disease, dietary indiscretion, timing of last BP medication dose.  - Continued current meds for now. Will continue to assess at each viist

## 2017-09-15 NOTE — Assessment & Plan Note (Signed)
#  Diabetes longstanding currently uncontrolled as evidenced by A1C. Patient denies hypoglycemic events and is able to verbalize appropriate hypoglycemia management plan. Patient reports adherence with medication. Control is suboptimal due to dietary indiscretion, pancreatic insufficiency. Pt demonstrates appropriate priming and administration technique. - Increased humalog to 4 units with largest meal of the day. Prime with 1 unit prior to use.  - Patient technically still a candidate for metformin with renal function at this time, however I suspect that patient needs insulin replacement and the issue is NOT reduced insulin sensitivity. Therefore, will not restart at this time. - Asked patient to check CBGs BID - Next A1C anticipated 11/27/2017 or later - Offered urine microalbumin today however patient states she doesn't think she can urinate as she went immediately prior to visit. Will attempt to collect at next visit.

## 2017-09-16 ENCOUNTER — Other Ambulatory Visit: Payer: Self-pay | Admitting: Family Medicine

## 2017-09-17 ENCOUNTER — Other Ambulatory Visit: Payer: Self-pay | Admitting: Family

## 2017-09-17 ENCOUNTER — Telehealth: Payer: Self-pay | Admitting: Family

## 2017-09-17 DIAGNOSIS — E1165 Type 2 diabetes mellitus with hyperglycemia: Secondary | ICD-10-CM

## 2017-09-17 DIAGNOSIS — IMO0002 Reserved for concepts with insufficient information to code with codable children: Secondary | ICD-10-CM

## 2017-09-17 DIAGNOSIS — E1151 Type 2 diabetes mellitus with diabetic peripheral angiopathy without gangrene: Secondary | ICD-10-CM

## 2017-09-17 DIAGNOSIS — F339 Major depressive disorder, recurrent, unspecified: Secondary | ICD-10-CM

## 2017-09-17 MED ORDER — GLUCOSE BLOOD VI STRP: ORAL_STRIP | 3 refills | Status: DC

## 2017-09-17 MED ORDER — ONETOUCH LANCETS MISC: 3 refills | Status: DC

## 2017-09-17 MED ORDER — MIRTAZAPINE 15 MG PO TABS: 15.0000 mg | ORAL_TABLET | Freq: Every day | ORAL | 1 refills | Status: DC

## 2017-09-17 NOTE — Telephone Encounter (Signed)
Call pt  Need more info about HA.   What is BP?  Ensure that is not a severe ha. Does she have a HA history?  Does she know trigger?   Does she need to go to urgent care?   She may take tyelonol for pain.

## 2017-09-17 NOTE — Telephone Encounter (Signed)
Patient called me as well to request test strips be sent to [facility]. She also requests medication for headache which has been ongoing for several days. Patient reports blurry vision and photosensitivity requiring her to wear sunglasses. Patient denies slurred speech or one-sided weakness.   Patient confirms adherence to humalog 4 units with largest meal of the day. States her CBG was 137 fasting this morning. Denies hypoglycemia. Has not been checking at night. Asked patient to start checking BID.  Will send Rx for test strips. Will route this note to [name], NP for headache complaints.   Carlean Jews, Pharm.D., BCPS PGY2 Ambulatory Care Pharmacy Resident Phone: [phone]

## 2017-09-17 NOTE — Telephone Encounter (Signed)
Called pt and talked with her. Patient is complaining of head aches behind her eyes. She also complained of pressure in her face and drainage in her throat. Not coughing. Ears hurt intermittently. No acute symptoms noted during this phone call. Says that pain is relieved with tylenol. Sounded to me like maybe sinuses so I have placed her on Julie's schedule tomorrow for evaluation.

## 2017-09-17 NOTE — Telephone Encounter (Signed)
Copied from [place] [id number]. Topic: Quick Communication - Rx Refill/Question >> Sep 17, 2017  3:27 PM Scherrie Gerlach wrote: Medication: glucose blood (ONE TOUCH TEST STRIPS) test strip Has the patient contacted their pharmacy? no  This rx was sent to [facility] in 12/18.  Pt states she does not use [facility] anymore.  I asked to pt tp call current pharmacy and have Rx transferred. Pt states it would be better if we could transfer for her.   Pt states she has a headache and would appreciate our office calling for her to have this Rx moved.  [place], [place] - [place] [phone] (Phone) [phone] (Fax)

## 2017-09-17 NOTE — Addendum Note (Signed)
Addended by: Deirdre Pippins E on: 09/17/2017 04:22 PM   Modules accepted: Orders

## 2017-09-18 ENCOUNTER — Ambulatory Visit (INDEPENDENT_AMBULATORY_CARE_PROVIDER_SITE_OTHER): Payer: Medicare Other | Admitting: Family Medicine

## 2017-09-18 ENCOUNTER — Encounter: Payer: Self-pay | Admitting: Family Medicine

## 2017-09-18 ENCOUNTER — Telehealth: Payer: Self-pay | Admitting: Family

## 2017-09-18 VITALS — BP 140/64 | HR 55 | Temp 97.8°F | Resp 15 | Wt 83.2 lb

## 2017-09-18 DIAGNOSIS — R519 Headache, unspecified: Secondary | ICD-10-CM

## 2017-09-18 DIAGNOSIS — R51 Headache: Secondary | ICD-10-CM | POA: Diagnosis not present

## 2017-09-18 DIAGNOSIS — J3089 Other allergic rhinitis: Secondary | ICD-10-CM

## 2017-09-18 DIAGNOSIS — I10 Essential (primary) hypertension: Secondary | ICD-10-CM

## 2017-09-18 MED ORDER — FLUTICASONE PROPIONATE 50 MCG/ACT NA SUSP: 1.0000 | Freq: Every day | NASAL | 0 refills | Status: DC

## 2017-09-18 NOTE — Telephone Encounter (Signed)
Copied from [place] [phone]. Topic: Quick Communication - Rx Refill/Question >> Sep 18, 2017  1:44 PM Scherrie Gerlach wrote: Medication: mirtazapine (REMERON) 15 MG tablet [place], [place] - calling to advise they received this rx request yesterday, and they have that pt is allergic to this med.  They called the son who confirms the pt is allergic to this medications.pharmacy would like a call back to advise.  [place], [place] - [place] [phone] (Phone) [phone] (Fax)

## 2017-09-18 NOTE — Telephone Encounter (Signed)
Sorry for the confusion- odd as she was taking the remeron prior to my sending it in yesterday.   Patient asked pharmacist Chrys Racer to refill remeron and instead of ODT tablet, she wanted tablet as she told Chrys Racer it made her mouth dry so I sent in a new Rx for remeron at patients request.   Please confirm with patient if allergy to remeron AND if she needs the medication to begin with. It is for depression.

## 2017-09-18 NOTE — Progress Notes (Signed)
Patient ID: Tiffany Bailey, female   DOB: [birth year], 84 y.o.   MRN: [id number]  PCP: [name], FNP  Subjective:  Tiffany Bailey is a 84 y.o. year old very pleasant female patient who presents with symptoms including nasal congestion, rhinitis with clear drainage, post nasal drip, and mild sinus pressure on left side worse than right. -started reported: 2 weeks ago , symptoms are not improving or worsening. She reports that this occurs every season change.  -previous treatments: Tylenol has provided benefit, one dose of fluticasone today with no benefit appreciated. -sick contacts/travel/risks: denies flu exposure or other sick contacts.  -Hx of: allergies (seasonal).   She denies HA today and she reports that pain was described as aching and then it improved with Tylenol. Last Tylenol dose 24 hours ago which provided relief and no HA at this time. Treatment with steam provided benefit. Nasal congestion is associated. She denies dizziness, change in vision, chest pain, palpitations, SOB, numbness, tingling, weakness, headaches, or edema.   ROS-denies fever, SOB, NVD, tooth pain, HA  Pertinent Past Medical History- HTN, CAD, PAD, GERD, DM  Medications- reviewed  Current Outpatient Medications  Medication Sig Dispense Refill  . acetaminophen (TYLENOL) 500 MG tablet Take 1 tablet (500 mg total) by mouth every 6 (six) hours as needed. 30 tablet 0  . amLODipine (NORVASC) 10 MG tablet Take 1 tablet (10 mg total) by mouth daily. 90 tablet 0  . aspirin EC 81 MG tablet Take 1 tablet (81 mg total) by mouth daily. 30 tablet 5  . atorvastatin (LIPITOR) 40 MG tablet Take 1 tablet (40 mg total) by mouth daily. 90 tablet 0  . BD PEN NEEDLE NANO U/F 32G X 4 MM MISC USE 1 DAILY 90 each 1  . blood glucose meter kit and supplies KIT Dispense based on patient and insurance preference. Use up to four times daily as directed. (FOR ICD-9 250.00, 250.01). 1 each 0  . clopidogrel (PLAVIX) 75 MG tablet  TAKE 1 TABLET BY MOUTH  DAILY 90 tablet 3  . fluticasone (FLONASE) 50 MCG/ACT nasal spray Place 1 spray into both nostrils daily.    Marland Kitchen glucose blood (ONE TOUCH TEST STRIPS) test strip Use to test blood sugar twice daily 200 each 3  . insulin lispro (HUMALOG KWIKPEN) 100 UNIT/ML KiwkPen Inject 0.04 mLs (4 Units total) into the skin daily with supper. Or with largest meal of the day. 3 mL 11  . metoprolol tartrate (LOPRESSOR) 50 MG tablet Take 50 mg by mouth 2 (two) times daily.    . mirtazapine (REMERON) 15 MG tablet Take 1 tablet (15 mg total) by mouth at bedtime. 90 tablet 1  . Multiple Vitamin (MULTIVITAMIN) tablet Take 1 tablet by mouth daily.    . ONE TOUCH LANCETS MISC Use as directed 2 times per day 200 each 3  . pantoprazole (PROTONIX) 40 MG tablet Take 1 tablet (40 mg total) by mouth 2 (two) times daily before a meal. (Patient taking differently: Take 40 mg by mouth daily. ) 180 tablet 0  . tizanidine (ZANAFLEX) 2 MG capsule Take 1 capsule (2 mg total) by mouth 3 (three) times daily. 30 capsule 0   No current facility-administered medications for this visit.     Objective: BP (!) 170/60 (BP Location: Left Arm, Patient Position: Sitting, Cuff Size: Normal)   Pulse (!) 55   Temp 97.8 F (36.6 C) (Oral)   Resp 15   Wt 83 lb 4 oz (37.8 kg)  SpO2 98%   BMI 16.26 kg/m  Gen: NAD, resting comfortably HEENT: Turbinates erythematous, TMs normal, oropharynx clear and moist, no tonsilar exudate or edema, no sinus tenderness with palpation CV: RRR no murmurs rubs or gallops Lungs: CTAB no crackles, wheeze, rhonchi Abdomen: soft/nontender/nondistended/normal bowel sounds. No rebound or guarding.  Ext: no edema Skin: warm, dry, no rash Neuro: grossly normal, moves all extremities. II-Visual fields grossly intact. III/IV/VI-Extraocular movements intact. Pupils reactive bilaterally. V/VII-Smile symmetric, equal eyebrow raise, facial sensation intact VIII- Hearing grossly  intact  Assessment/Plan:th 1. Allergic rhinitis due to other allergic trigger, unspecified seasonality Symptoms are most consistent with allergic rhinitis and occur seasonally. We discussed mechanism of action of fluticasone and that this should be used on a daily basis with symptoms and one dose today will most likely not demonstrate immediate benefit. She will initiate daily use; we will hold off on oral antihistamine such as Allegra, Claritin, or Zyrtec with history of CKD and patient symptoms are not worsening, and she has not tried daily use of flonase first.  - fluticasone (FLONASE) 50 MCG/ACT nasal spray; Place 1 spray into both nostrils daily.  Dispense: 16 g; Refill: 0  2. Essential hypertension Retake of BP 140/64. She is adherent to BP medications and is taking them according to prescribed directions. She has ordered a new BP cuff to monitor BP and has agreed to document readings and bring with her to her next appointment or sooner follow up if >140/90. HTN is longstanding and treatment limited by diastolic readings. With recheck today being at goal; no change in medications today.   3. Nonintractable headache, unspecified chronicity pattern, unspecified headache type No HA today; exam is reassuring; suspect that symptom is related to nasal congestion. She will start flonase as described above. She will use acetaminophen if needed and follow up if symptom returns and persists, or she has new symptoms such as dizziness, change in vision, SOB, chest pain, palpitations or fever.    Finally, we reviewed reasons to return to care including if symptoms worsen or persist or new concerns arise- once again particularly shortness of breath or fever.   Laurita Quint, FNP

## 2017-09-18 NOTE — Patient Instructions (Signed)
Please use nasal spray as directed and you can use Tylenol for discomfort if needed.  Follow up if symptoms do not improve with treatment or if new symptoms develop.  Allergic Rhinitis, Adult Allergic rhinitis is an allergic reaction that affects the mucous membrane inside the nose. It causes sneezing, a runny or stuffy nose, and the feeling of mucus going down the back of the throat (postnasal drip). Allergic rhinitis can be mild to severe. There are two types of allergic rhinitis:  Seasonal. This type is also called hay fever. It happens only during certain seasons.  Perennial. This type can happen at any time of the year.  What are the causes? This condition happens when the body's defense system (immune system) responds to certain harmless substances called allergens as though they were germs.  Seasonal allergic rhinitis is triggered by pollen, which can come from grasses, trees, and weeds. Perennial allergic rhinitis may be caused by:  House dust mites.  Pet dander.  Mold spores.  What are the signs or symptoms? Symptoms of this condition include:  Sneezing.  Runny or stuffy nose (nasal congestion).  Postnasal drip.  Itchy nose.  Tearing of the eyes.  Trouble sleeping.  Daytime sleepiness.  How is this diagnosed? This condition may be diagnosed based on:  Your medical history.  A physical exam.  Tests to check for related conditions, such as: ? Asthma. ? Pink eye. ? Ear infection. ? Upper respiratory infection.  Tests to find out which allergens trigger your symptoms. These may include skin or blood tests.  How is this treated? There is no cure for this condition, but treatment can help control symptoms. Treatment may include:  Taking medicines that block allergy symptoms, such as antihistamines. Medicine may be given as a shot, nasal spray, or pill.  Avoiding the allergen.  Desensitization. This treatment involves getting ongoing shots until your  body becomes less sensitive to the allergen. This treatment may be done if other treatments do not help.  If taking medicine and avoiding the allergen does not work, new, stronger medicines may be prescribed.  Follow these instructions at home:  Find out what you are allergic to. Common allergens include smoke, dust, and pollen.  Avoid the things you are allergic to. These are some things you can do to help avoid allergens: ? Replace carpet with wood, tile, or vinyl flooring. Carpet can trap dander and dust. ? Do not smoke. Do not allow smoking in your home. ? Change your heating and air conditioning filter at least once a month. ? During allergy season:  Keep windows closed as much as possible.  Plan outdoor activities when pollen counts are lowest. This is usually during the evening hours.  When coming indoors, change clothing and shower before sitting on furniture or bedding.  Take over-the-counter and prescription medicines only as told by your health care provider.  Keep all follow-up visits as told by your health care provider. This is important. Contact a health care provider if:  You have a fever.  You develop a persistent cough.  You make whistling sounds when you breathe (you wheeze).  Your symptoms interfere with your normal daily activities. Get help right away if:  You have shortness of breath. Summary  This condition can be managed by taking medicines as directed and avoiding allergens.  Contact your health care provider if you develop a persistent cough or fever.  During allergy season, keep windows closed as much as possible. This information is not  intended to replace advice given to you by your health care provider. Make sure you discuss any questions you have with your health care provider. Document Released: 02/26/2001 Document Revised: 07/11/2016 Document Reviewed: 07/11/2016 Elsevier Interactive Patient Education  [employer].

## 2017-09-18 NOTE — Telephone Encounter (Signed)
Spoke with patient she states taking  ODT tablets makes her mouth very dry and can't breath .  She states she has never stopped breathing while taking ODT remeron .   Reports no problem taking remeron tablet.  While talking to patient she was having conversation with family members hard to communicating.

## 2017-09-19 NOTE — Telephone Encounter (Signed)
Okay. Thanks for clariftying.,   Updated allergy list

## 2017-09-24 ENCOUNTER — Ambulatory Visit: Payer: Self-pay | Admitting: *Deleted

## 2017-09-24 NOTE — Telephone Encounter (Signed)
Pt  States   Her  Sugar  Has  Been  Elevated  Recently  Placed on    [employer]   Pt  Was  In   Office   Recently  For  Instruction   Of  [employer]  .   Conference   With     [employer]   About  Medication management.      Answer Assessment - Initial Assessment Questions 1. BLOOD GLUCOSE: "What is your blood glucose level?"        283 this  Am    Last night   Was  389    2. ONSET: "When did you check the blood glucose?"        This  Am   1 and  1/2  Hour ago    3. USUAL RANGE: "What is your glucose level usually?" (e.g., usual fasting morning value, usual evening value)     Ranges in  80 -90   In am     150-160  Evening    4. KETONES: "Do you check for ketones (urine or blood test strips)?" If yes, ask: "What does the test show now?"         No   5. TYPE 1 or 2:  "Do you know what type of diabetes you have?"  (e.g., Type 1, Type 2, Gestational; doesn't know)         TYPE  2   6. INSULIN: "Do you take insulin?" If yes, ask: "Have you missed any shots recently?"     YES    Missed  Shots  Last week -   Recently  Put  On  kwick pen   7. DIABETES PILLS: "Do you take any pills for your diabetes?" If yes, ask: "Have you missed taking any pills recently?"        Not on  Any pills    8. OTHER SYMPTOMS: "Do you have any symptoms?" (e.g., fever, frequent urination, difficulty breathing, dizziness, weakness, vomiting)         Frequent  Urination    9. PREGNANCY: "Is there any chance you are pregnant?" "When was your last menstrual period?"     N/a  Protocols used: DIABETES - HIGH BLOOD SUGAR-A-AH

## 2017-09-24 NOTE — Telephone Encounter (Signed)
Apologize for my typos  before.  I would appreciate your advice before making changes.   What I meant to ask  if you think she could tolerate a long-acting insulin and still take the lispro 4 units with her largest meal?

## 2017-09-24 NOTE — Telephone Encounter (Signed)
Tiffany Bailey went to suggestion with this patient.  Is a reason to think she can tolerate a long-acting insulin and still take the lispro for with meals.  I just worry so much about her comorbidities and fragility.    I would appreciate your advice for make any changes.

## 2017-09-24 NOTE — Telephone Encounter (Signed)
Call pt  FBG looks good 80-90.  150-160 in evening is also fine.   She said she is missing shot to [place]. Concern with frequent urination hand her telling us that sugar was 389 at bedtime.  This morning was 269 fasting  Is she taking lispro with Largest meal? Caroline increased from 2untis to 4 units at last visit.   Is she doing this?

## 2017-09-24 NOTE — Telephone Encounter (Signed)
Spoke with patient confirmed she is using 4 units of with largest meal.   She states she may miss dose of insulin , however she will take when she gets back home.  Again patient confirmed she knew how to use the insulin pen she repeated instruction back to me step by step.   She states pharmacy has instructed her as well as the office here.

## 2017-09-24 NOTE — Telephone Encounter (Signed)
Patient is confident in using the [facility] pen she is just afraid because her sugars are so high,  Spoke with patient concerning the use of pen she is ok with this just sugars are anywhere between 389 at bedtime and 269 this am fasting.

## 2017-09-26 ENCOUNTER — Ambulatory Visit: Payer: Self-pay | Admitting: *Deleted

## 2017-09-26 ENCOUNTER — Telehealth: Payer: Self-pay | Admitting: Family

## 2017-09-26 DIAGNOSIS — E1165 Type 2 diabetes mellitus with hyperglycemia: Secondary | ICD-10-CM

## 2017-09-26 DIAGNOSIS — Z794 Long term (current) use of insulin: Secondary | ICD-10-CM

## 2017-09-26 NOTE — Telephone Encounter (Signed)
Patient is calling with concerns that she is not in control of her glucose- she thinks that she is not getting enough insulin- her most recent readings : 3/29 fasting 202, 3/30 after breakfast 146, 3/31 after lunch 326, 4/1 after lunch 220, 4/2 after lunch 235, 4/3 fasting 137, 4/9 afternoon 367, 4/10 after breakfast 245, 4/10 at night 443, 4/11 at night 489.  This morning- very lite breakfast- 337. Patient takes insulin - in the afternoon around 2 o'clock daily. She takes 4 units. Call to office and they are going to call patient with adjustment and appointment. Reason for Disposition . [1] Blood glucose > 300 mg/dl (16.5 mmol/l) AND [2] two or more times in a row  Answer Assessment - Initial Assessment Questions 1. BLOOD GLUCOSE: "What is your blood glucose level?"      Patient is reported glucose levels that are elevated- patient is using a pen and she is not sure she is not getting her medication. 2. ONSET: "When did you check the blood glucose?"    Patient checked her glucose last night - 341  3. USUAL RANGE: "What is your glucose level usually?" (e.g., usual fasting morning value, usual evening value)     It has been measuring high since she got off pill 4. KETONES: "Do you check for ketones (urine or blood test strips)?" If yes, ask: "What does the test show now?"      no 5. TYPE 1 or 2:  "Do you know what type of diabetes you have?"  (e.g., Type 1, Type 2, Gestational; doesn't know)      Type 2 6. INSULIN: "Do you take insulin?" If yes, ask: "Have you missed any shots recently?"     Yes- no 7. DIABETES PILLS: "Do you take any pills for your diabetes?" If yes, ask: "Have you missed taking any pills recently?"     no 8. OTHER SYMPTOMS: "Do you have any symptoms?" (e.g., fever, frequent urination, difficulty breathing, dizziness, weakness, vomiting)     Frequent urination 9. PREGNANCY: "Is there any chance you are pregnant?" "When was your last menstrual period?"     n/a  Protocols used:  DIABETES - HIGH BLOOD SUGAR-A-AH

## 2017-09-26 NOTE — Telephone Encounter (Signed)
4/15 HFU appt is fine- want to see her asap  Referral for RN home care/med management, DM education placed

## 2017-09-26 NOTE — Telephone Encounter (Signed)
Appointment scheduled to see Tiffany Bailey on Monday after she seen by Chrys Racer , patient aware.

## 2017-09-26 NOTE — Telephone Encounter (Addendum)
Speaking with patient states when adminstering her insuling she holds insulin pen needle down on skin hears it snap doesn't  see inprint where she sticks needle onto skin  , doesn't feel sting with medicine going in.  She has been taught how to use insulin pen.  Then she states to me she knows how to use pen . Advised patient Tiffany Bailey wants fasting blood sugar less than 130 , post prandial readings 160.  She was instructed to increase her insulin to 6 units with largest meal . Told patient needs to check blood sugars more consistently and keep log .    Patient verbalized understanding and repeated instructions back to me.   Reminder to place Homecare referral for RN medication management. Appointment scheduled to see you 10/13/17 unless you want me to place her in hospital follow up slot on 09/29/17 please advise. Thanks

## 2017-09-26 NOTE — Telephone Encounter (Signed)
Spoke with patient , advised her that Joycelyn Schmid will see her after appointment with Chrys Racer around 11:00am. Patient verbalized understanding.

## 2017-09-26 NOTE — Telephone Encounter (Signed)
See note about HFU okay  For some reason it wouldn't route to you Referral to home health placed

## 2017-09-27 ENCOUNTER — Emergency Department
Admission: EM | Admit: 2017-09-27 | Discharge: 2017-09-27 | Discharge status: Against Medical Advice | Payer: Medicare Other

## 2017-09-27 LAB — GLUCOSE, CAPILLARY: GLUCOSE-CAPILLARY: 114 mg/dL — AB (ref 65–99)

## 2017-09-27 NOTE — ED Notes (Signed)
Patient CBG read 117 and she has decided to leave facility and not be seen due her own meter malfunction.  Patient in NAD at this time and has no complaints.

## 2017-09-29 ENCOUNTER — Encounter: Payer: Self-pay | Admitting: Pharmacist

## 2017-09-29 ENCOUNTER — Telehealth: Payer: Self-pay | Admitting: Pharmacist

## 2017-09-29 ENCOUNTER — Ambulatory Visit (INDEPENDENT_AMBULATORY_CARE_PROVIDER_SITE_OTHER): Payer: Medicare Other | Admitting: Family

## 2017-09-29 ENCOUNTER — Ambulatory Visit (INDEPENDENT_AMBULATORY_CARE_PROVIDER_SITE_OTHER): Payer: Medicare Other | Admitting: Pharmacist

## 2017-09-29 ENCOUNTER — Encounter: Payer: Self-pay | Admitting: Family

## 2017-09-29 VITALS — BP 169/54 | HR 63

## 2017-09-29 VITALS — BP 155/60 | HR 62 | Temp 98.1°F | Resp 15

## 2017-09-29 DIAGNOSIS — E1129 Type 2 diabetes mellitus with other diabetic kidney complication: Secondary | ICD-10-CM | POA: Diagnosis not present

## 2017-09-29 DIAGNOSIS — L989 Disorder of the skin and subcutaneous tissue, unspecified: Secondary | ICD-10-CM

## 2017-09-29 DIAGNOSIS — I10 Essential (primary) hypertension: Secondary | ICD-10-CM | POA: Diagnosis not present

## 2017-09-29 DIAGNOSIS — E1151 Type 2 diabetes mellitus with diabetic peripheral angiopathy without gangrene: Secondary | ICD-10-CM

## 2017-09-29 DIAGNOSIS — Z23 Encounter for immunization: Secondary | ICD-10-CM | POA: Diagnosis not present

## 2017-09-29 DIAGNOSIS — E1165 Type 2 diabetes mellitus with hyperglycemia: Secondary | ICD-10-CM

## 2017-09-29 DIAGNOSIS — Z794 Long term (current) use of insulin: Secondary | ICD-10-CM | POA: Diagnosis not present

## 2017-09-29 DIAGNOSIS — IMO0002 Reserved for concepts with insufficient information to code with codable children: Secondary | ICD-10-CM

## 2017-09-29 MED ORDER — INSULIN LISPRO 100 UNIT/ML (KWIKPEN): PEN_INJECTOR | SUBCUTANEOUS | 11 refills | Status: DC

## 2017-09-29 NOTE — Patient Instructions (Addendum)
Thanks for coming to see me today. Try to reduce juice, gatorade, sodas.   Increase insulin to 8 units with large meals, 6 units with small meals.   I'll call you next week to get your blood sugars over the phone. CALL ME if you have any numbers less than 100.

## 2017-09-29 NOTE — Telephone Encounter (Signed)
Called patient to f/u on ED visit - did not realize she has appointment scheduled with me today. Patient reports she is planning to come to visit though she has a headache. Asked her to bring in meter. She agrees.   Carlean Jews, Pharm.D., BCPS PGY2 [facility] Pharm[facility] Resident Phone: [phone]

## 2017-09-29 NOTE — Assessment & Plan Note (Addendum)
Working very closely with pharmacist, Chrys Racer regarding this case.  We are limited by patient's kidney disease, erratic diet.  Long discussion with Chrys Racer regarding our concern for hypoglycemia as patient had experienced on Lantus in the past.  At this time, we jointly agreed patient appears to be eating more ( perhaps not of the right things)  therefore we both emphasized the importance of only taking insulin when she is eating an adequate meal.  I advised her to stop drinking Gatorade, juice or at least limiting these items and focusing more on eating a protein with a carbohydrate with each meal, snack.   I had a Long discussion with patient and daughter regarding this and daughter was quite supportive.   For a light breakfast such as corn flakes, with advised her to do 6 units however for a larger meal she may have to 8 units.  We will tread very lightly with patient due to her fragility, age.  A1c goal between 8-8.5 I think is appropriate.  She is to follow up with me in 2 weeks. Chrys Racer will also be following her as well. Pending home care visit for DM education as well.

## 2017-09-29 NOTE — Assessment & Plan Note (Signed)
Symptoms consistent with seborrheic keratosis however patient has several macules on her back.  Jointly agreed that she would need further evaluation by dermatology, possible biopsy.  Referral placed

## 2017-09-29 NOTE — Assessment & Plan Note (Signed)
Systolic blood pressure elevated.  We are limited by patient's low diastolic ; patient does report occasional dizziness with standing.  She has not had a follow-up with cardiology in a year.  She is unable to drive ; daughter and patient preferred today to see cardiology locally.  New referral has been placed to establish care in [place].  Will follow.

## 2017-09-29 NOTE — Assessment & Plan Note (Signed)
#  Diabetes longstanding currentlyuncontrolled as evidenced by A1C and CBGs. Patientdenieshypoglycemic eventsand is able to verbalize appropriate hypoglycemia management plan.Patient reportsadherence with medication. Control is suboptimal due to increased PO intake,dietary indiscretion, pancreatic insufficiency.Pt demonstrates appropriate priming and administration technique. - Increased humalog to 6 units with small meals, 8 units with large meals of the day. Prime with 1 unit prior to use. - Patient technically still a candidate for metformin with renal function at this time, however I suspect that patient needs insulin replacement and the issue is NOT reduced insulin sensitivity. Therefore, will not restart at this time. - Asked patient to check CBGs BID - Next A1C anticipated6/13/2019 or later

## 2017-09-29 NOTE — Progress Notes (Signed)
S:     Chief Complaint  Patient presents with  . Medication Management    Diabetes    Patient arrives in good spirits, ambulating with wheelchair, presenting with daughter, Tiffany Bailey.  Presents for diabetes evaluation, education, and management at the request of [name], NP/Dr. Tullo(referred on 08/27/17). Last seen by primary care provider on3/11/2017- at that time A1C and renal function were found to be worsened in setting on noncompliance to lantus (concern for hypoglycemia), metformin was d/c'd for decreased renal function. Last Rx Clinic visit on4/1//2019- at that time humalog was increased.  Today, patient reports she is doing "fairly well". Went to ED when she saw CBG of ~900 however discovered that meter was wrong. Now has new meter - True track. Giving Humalog 6 units with largest meal of the day. Now eating ~2 meals/day. Reports HA had stopped x2 weeks. Returns today but she attributes this to allergies. Denies hypoglycemia. Discussed situation with daughter, Tiffany Bailey, who agrees that continuing humalog titration is a better option than re-trying lantus because of her h/o hypoglycemia.   Patient reports adherence with medications.  Current diabetes medications include: Humalog 6 units with largest meal of the day Current hypertension medications include: amlodipine 2.5 mg daily, metoprolol tartrate 50 mg BID  Patient denies hypoglycemic events.  Patient reported dietary habits: Eats 2 meals/day Breakfast:cereal OR bacon + biscuits Lunch:Goes out to eat at fried fish place Dinner: left overs Snacks:none Beverages:coke, cranberry juice, gaterade, water   Patient reports nocturia. 3-4 times nightly  Patient denies pain/burning on urination.  Patient reports neuropathy. Patient denies visual changes. Patient reports self foot exams.   O:  Physical Exam  Constitutional: She appears well-developed and well-nourished.   Scab on top of R foot. Does not look infected.     Review of Systems  Musculoskeletal: Negative for falls.  Neurological: Positive for dizziness (when rising from bed) and headaches.  All other systems reviewed and are negative.    Lab Results  Component Value Date   HGBA1C 9.3 (H) 08/27/2017   Vitals:   09/29/17 1013 09/29/17 1014  BP: (!) 170/66 (!) 169/54  Pulse:  63    Lipid Panel     Component Value Date/Time   CHOL 151 08/27/2017 0927   TRIG 66.0 08/27/2017 0927   HDL 50.90 08/27/2017 0927   CHOLHDL 3 08/27/2017 0927   VLDL 13.2 08/27/2017 0927   LDLCALC 87 08/27/2017 0927    Blood sugars 250-350, excursions to 400s. Generally lower (200s) in the mornings.   Clinical ASCVD:Yes; High-risk conditions: Age 77 or older, h/o coronary bypass or PCI, HTN, CKD  A/P: #Diabetes longstanding currentlyuncontrolled as evidenced by A1C and CBGs. Patientdenieshypoglycemic eventsand is able to verbalize appropriate hypoglycemia management plan.Patient reportsadherence with medication. Control is suboptimal due to increased PO intake,dietary indiscretion, pancreatic insufficiency.Pt demonstrates appropriate priming and administration technique. - Increased humalog to 6 units with small meals, 8 units with large meals of the day. Prime with 1 unit prior to use. - Patient technically still a candidate for metformin with renal function at this time, however I suspect that patient needs insulin replacement and the issue is NOT reduced insulin sensitivity. Therefore, will not restart at this time. - Asked patient to check CBGs BID - Next A1C anticipated6/13/2019 or later  #ASCVD risk - secondary preventionin pt with DM, HTN, CKD, stable ASCVD - high intensity statin indicated.  -ContinuedAspirin 81mg   -Continuedatorvastatin40mg   #Hypertensionlongstanding and historically very hard to control, limited by diastolic pressures.  Systolic pressure uncontrolled.Patientreportsadherence with medication. Control is  suboptimal due todiastolic borderline hypotension, renal disease, dietary indiscretion. Some dizziness reported, no falls per patient.  -Continued current meds. Will continue to assess at each viist  Written patient instructions provided.  Total time in face to face counseling 20 minutes.    Follow up in Pharmacist Clinic Visit - 1 week over the phone. Patient to see [name], NP (PCP) immediately after this visit.   Carlean Jews, Pharm.D., BCPS, CPP PGY2 Ambulatory Care Pharmacy Resident Phone: [phone]

## 2017-09-29 NOTE — Assessment & Plan Note (Signed)
#  ASCVD risk - secondary preventionin pt with DM, HTN, CKD, stable ASCVD - high intensity statin indicated.  -ContinuedAspirin 81mg   -Continuedatorvastatin40mg   #Hypertensionlongstanding and historically very hard to control, limited by diastolic pressures. Systolic pressure uncontrolled.Patientreportsadherence with medication. Control is suboptimal due todiastolic borderline hypotension, renal disease, dietary indiscretion. Some dizziness reported, no falls per patient.  -Continued current meds. Will continue to assess at each viist

## 2017-09-29 NOTE — Patient Instructions (Addendum)
Need to eat regular meals - frozen meals as we discussed. Let me know of any lows.   Needs tetanus vaccine- please have this done at local pharmacy and let me know when you do.   Referral to local cardiologist  Let me know if you have not gotten call from home health.   Today we discussed referrals, orders. Dermatology, cardiology.    I have placed these orders in the system for you.  Please be sure to give [place] a call if you have not heard from our office regarding scheduling a test or regarding referral in a timely manner.  It is very important that you let me know as soon as possible.

## 2017-09-29 NOTE — Progress Notes (Signed)
Subjective:    Patient ID: Tiffany Bailey, female    DOB: [birth year], 84 y.o.   MRN: [id number]  CC: Tiffany Bailey is a 84 y.o. female who presents today for follow up.   HPI: Patient feels well today.  She is in good spirits she is going to purchase a car after this appointment.  She is accompanied by her daughter, [name].  She is just seen pharmacist here, Chrys Racer.  She notes that she has been drinking Gatorade, juice. Doesn't eat three meals per day. Sometimes corn flakes in the morning;  fish ,green beans for dinner. Pack of nabs for snack.   Has been giving insulin either before a meal or right after ( if out). She had been on 6 units with largest meal.   Checking blood sugar twice per day , once fasting in morning and once in the evening before bed.   Reports going to ED 4/13 as blood glucameter saif 962, at ED 114.   No hypoglycemia.   Has a home health at one time, no longer.  Saw NP at [facility] 08/2016. Prefers cardiology in town.   CAD- on plavix, asa, atorvastatin  HTN- carvediolol, amlodipine; some dizziness with standing.   Complains of itchy mole on thoracic back. Not bleeding. No h/o  skin cancer.     HISTORY:  Past Medical History:  Diagnosis Date  . Anemia   . Anginal pain ([place])   . Arthritis   . Cancer (HCC)    BREAST  . CKD (chronic kidney disease), stage III ([place])   . Coronary artery disease    S/P CABG and multiple PCI's  . Diabetes mellitus without complication ([place])   . Edema   . Failure to thrive in adult   . GERD (gastroesophageal reflux disease)   . History of breast cancer   . HOH (hard of hearing)   . Hyperlipidemia   . Hypertension   . Myocardial infarction ([place])   . Palpitations    Past Surgical History:  Procedure Laterality Date  . ABDOMINAL HYSTERECTOMY    . ABOVE KNEE LEG AMPUTATION Left 2013  . ANTERIOR VITRECTOMY Left 11/16/2015   Procedure: ANTERIOR VITRECTOMY;  Surgeon: Eulogio Bear, MD;  Location: [facility] ORS;   Service: Ophthalmology;  Laterality: Left;  . BLADDER SURGERY    . CATARACT EXTRACTION W/PHACO Left 11/16/2015   Procedure: CATARACT EXTRACTION PHACO AND INTRAOCULAR LENS PLACEMENT (IOC);  Surgeon: Eulogio Bear, MD;  Location: [facility] ORS;  Service: Ophthalmology;  Laterality: Left;  Lot # [id number] H [place]; 01:22.8 AP%:12.6 CDE: 10.46  . CATARACT EXTRACTION W/PHACO Right 08/22/2016   Procedure: CATARACT EXTRACTION PHACO AND INTRAOCULAR LENS PLACEMENT (IOC);  Surgeon: Eulogio Bear, MD;  Location: [facility] ORS;  Service: Ophthalmology;  Laterality: Right;  Lot # [id number] H [place]: 00:52.1 AP%:8.7 CDE: 4.99   . CORONARY ANGIOPLASTY     STENT  . CORONARY ARTERY BYPASS GRAFT    . MASTECTOMY    . TUMOR REMOVAL     ABDOMINAL   Family History  Problem Relation Age of Onset  . Sudden death Mother   . Arthritis Father   . Stroke Father   . Breast cancer Sister   . Diabetes Grandchild     Allergies: Amoxicillin; Gabapentin; Mirtazapine; and Other Current Outpatient Medications on File Prior to Visit  Medication Sig Dispense Refill  . acetaminophen (TYLENOL) 500 MG tablet Take 1 tablet (500 mg total) by mouth every 6 (six) hours as needed. 30 tablet  0  . amLODipine (NORVASC) 10 MG tablet Take 1 tablet (10 mg total) by mouth daily. 90 tablet 0  . aspirin EC 81 MG tablet Take 1 tablet (81 mg total) by mouth daily. 30 tablet 5  . atorvastatin (LIPITOR) 40 MG tablet Take 1 tablet (40 mg total) by mouth daily. 90 tablet 0  . BD PEN NEEDLE NANO U/F 32G X 4 MM MISC USE 1 DAILY 90 each 1  . blood glucose meter kit and supplies KIT Dispense based on patient and insurance preference. Use up to four times daily as directed. (FOR ICD-9 250.00, 250.01). 1 each 0  . clopidogrel (PLAVIX) 75 MG tablet TAKE 1 TABLET BY MOUTH  DAILY 90 tablet 3  . fluticasone (FLONASE) 50 MCG/ACT nasal spray Place 1 spray into both nostrils daily. 16 g 0  . glucose blood (ONE TOUCH TEST STRIPS) test strip Use to test blood sugar twice  daily 200 each 3  . insulin lispro (HUMALOG KWIKPEN) 100 UNIT/ML KiwkPen Inject 8 units under the skin with large meals, 6 units under the skin with small meals (breakfast) 3 mL 11  . metoprolol tartrate (LOPRESSOR) 50 MG tablet Take 50 mg by mouth 2 (two) times daily.    . mirtazapine (REMERON) 15 MG tablet Take 1 tablet (15 mg total) by mouth at bedtime. 90 tablet 1  . Multiple Vitamin (MULTIVITAMIN) tablet Take 1 tablet by mouth daily.    . ONE TOUCH LANCETS MISC Use as directed 2 times per day 200 each 3  . pantoprazole (PROTONIX) 40 MG tablet Take 1 tablet (40 mg total) by mouth 2 (two) times daily before a meal. (Patient taking differently: Take 40 mg by mouth daily. ) 180 tablet 0  . tizanidine (ZANAFLEX) 2 MG capsule Take 1 capsule (2 mg total) by mouth 3 (three) times daily. 30 capsule 0   No current facility-administered medications on file prior to visit.     Social History   Tobacco Use  . Smoking status: Never Smoker  . Smokeless tobacco: Never Used  Substance Use Topics  . Alcohol use: No  . Drug use: No    Review of Systems  Constitutional: Negative for chills and fever.  Respiratory: Negative for cough.   Cardiovascular: Negative for chest pain and palpitations.  Gastrointestinal: Negative for nausea and vomiting.  Skin: Negative for color change.      Objective:    BP (!) 155/60   Pulse 62   Temp 98.1 F (36.7 C) (Oral)   Resp 15   SpO2 97%  BP Readings from Last 3 Encounters:  09/29/17 (!) 155/60  09/29/17 (!) 169/54  09/18/17 140/64   Wt Readings from Last 3 Encounters:  09/18/17 83 lb 4 oz (37.8 kg)  07/17/17 83 lb (37.6 kg)  06/23/17 83 lb 8 oz (37.9 kg)    Physical Exam  Constitutional: She appears well-developed and well-nourished.  Eyes: Conjunctivae are normal.  Cardiovascular: Normal rate, regular rhythm, normal heart sounds and normal pulses.  Pulmonary/Chest: Effort normal and breath sounds normal. She has no wheezes. She has no  rhonchi. She has no rales.  Neurological: She is alert.  Skin: Skin is warm and dry.     Brown colored stuck on lesion approximately 2-3 cm noted thoracic back as noted on diagram.  Nonbleeding.  No increased erythema, warmth. Multiple nevi, macules noted on thoracic back  Psychiatric: She has a normal mood and affect. Her speech is normal and behavior is normal. Thought content  normal.  Vitals reviewed.      Assessment & Plan:   Problem List Items Addressed This Visit      Cardiovascular and Mediastinum   Essential hypertension    Systolic blood pressure elevated.  We are limited by patient's low diastolic ; patient does report occasional dizziness with standing.  She has not had a follow-up with cardiology in a year.  She is unable to drive ; daughter and patient preferred today to see cardiology locally.  New referral has been placed to establish care in [place].  Will follow.      Relevant Orders   Ambulatory referral to Cardiology     Endocrine   DM (diabetes mellitus) ([place]) - Primary    Working very closely with pharmacist, Chrys Racer regarding this case.  We are limited by patient's kidney disease, erratic diet.  Long discussion with Chrys Racer regarding our concern for hypoglycemia as patient had experienced on Lantus in the past.  At this time, we jointly agreed patient appears to be eating more ( perhaps not of the right things)  therefore we both emphasized the importance of only taking insulin when she is eating an adequate meal.  I advised her to stop drinking Gatorade, juice or at least limiting these items and focusing more on eating a protein with a carbohydrate with each meal, snack.   I had a Long discussion with patient and daughter regarding this and daughter was quite supportive.   For a light breakfast such as corn flakes, with advised her to do 6 units however for a larger meal she may have to 8 units.  We will tread very lightly with patient due to her fragility,  age.  A1c goal between 8-8.5 I think is appropriate.  She is to follow up with me in 2 weeks. Chrys Racer will also be following her as well. Pending home care visit for DM education as well.         Musculoskeletal and Integument   Skin lesion    Symptoms consistent with seborrheic keratosis however patient has several macules on her back.  Jointly agreed that she would need further evaluation by dermatology, possible biopsy.  Referral placed      Relevant Orders   Ambulatory referral to Dermatology    Other Visit Diagnoses    Need for pneumococcal vaccination       Relevant Orders   Pneumococcal conjugate vaccine 13-valent IM (Completed)       I am having Tiffany Bailey maintain her multivitamin, pantoprazole, blood glucose meter kit and supplies, acetaminophen, aspirin EC, metoprolol tartrate, tizanidine, BD PEN NEEDLE NANO U/F, atorvastatin, amLODipine, clopidogrel, ONE TOUCH LANCETS, glucose blood, mirtazapine, fluticasone, and insulin lispro.   No orders of the defined types were placed in this encounter.   Return precautions given.   Risks, benefits, and alternatives of the medications and treatment plan prescribed today were discussed, and patient expressed understanding.   Education regarding symptom management and diagnosis given to patient on AVS.  Continue to follow with [name], FNP for routine health maintenance.   Tiffany Bailey and I agreed with plan.   [name], FNP

## 2017-09-30 ENCOUNTER — Encounter: Payer: Self-pay | Admitting: Family

## 2017-10-01 NOTE — Progress Notes (Signed)
  I have reviewed the above information and agree with above.   Nelma Phagan, MD 

## 2017-10-02 ENCOUNTER — Telehealth: Payer: Self-pay | Admitting: Family

## 2017-10-02 DIAGNOSIS — Z794 Long term (current) use of insulin: Secondary | ICD-10-CM

## 2017-10-02 DIAGNOSIS — E1165 Type 2 diabetes mellitus with hyperglycemia: Secondary | ICD-10-CM

## 2017-10-02 NOTE — Telephone Encounter (Signed)
Verbals orders given below to [place] at [place] .  He states  Patient has been checking blood sugar with home meter and and had episode where blood sugar over 400 and one time when it was over 600 and she went to ED and they drew lab and blood sugar was 114. Tiffany Bailey is going out to see patient on Monday and he is going to check blood sugar with her meter and see what results are then if there over 400 and he can draw CBC and send it to lab. He just want to see if it is ok and get orders  .

## 2017-10-02 NOTE — Telephone Encounter (Signed)
Copied from [place] [id number]. Topic: Quick Communication - See Telephone Encounter >> Oct 02, 2017 12:30 PM Ether Griffins B wrote: CRM for notification. See Telephone encounter for: 10/02/17.  Merry Proud with [facility] calling requesting verbal orders for nursing 2x for 8 weeks for disease management diabetes. Requesting PT and OT to evaluate and treat. Medical social worker for community resources and a home health aid. CB# [phone].

## 2017-10-02 NOTE — Telephone Encounter (Signed)
I'm aware of that one episode where blood sugars were very high. She went to ED and sugars were 114 so she left ED. Suspect meter error and was discussed with patient and daughter this week.   Why would we draw a CBC? Am I missing something?  If has been 3 months, we could look at a1c.  A BMP would show electrolytes / sugar however if meter is calibrated , she could just check her sugars- it would be ideal if she would  check fasting am, before meals and at bedtime.

## 2017-10-02 NOTE — Telephone Encounter (Signed)
You may give verbal orders for all below - education , PT, OT.   Thanks !!

## 2017-10-02 NOTE — Telephone Encounter (Signed)
Jeff//[facility]  checked blood sugar today and it was 480 around noon.  He will go in Monday help patient to check blood sugar.  Patient is  waking up and checking blood sugar it was 280 yesterday pm.  I advised him that she had been educated by our pharmacist on how to use meter. He will call us back on Monday with update with what he finds out when he goes to visit patient.

## 2017-10-06 NOTE — Telephone Encounter (Signed)
Advised patient referral placed to endocrinology .    Will await call from home health.

## 2017-10-06 NOTE — Telephone Encounter (Signed)
Call pt and pt daughter  I would like for her to see endocrine. We are just not making the progress that I had anticipated and her blood sugars are very labile.   We need to consult endocrine. I have placed referral  She needs to keep log until her appt with endocrine  Will await call from [facility] with  Home health

## 2017-10-13 ENCOUNTER — Telehealth: Payer: Self-pay | Admitting: Family

## 2017-10-13 ENCOUNTER — Ambulatory Visit: Payer: Medicare Other | Admitting: Family

## 2017-10-13 DIAGNOSIS — Z0289 Encounter for other administrative examinations: Secondary | ICD-10-CM

## 2017-10-13 NOTE — Telephone Encounter (Signed)
Tried calling patient voicemail full , need more information regarding cough

## 2017-10-13 NOTE — Telephone Encounter (Signed)
Copied from [place] [id number]. Topic: Quick Communication - See Telephone Encounter >> Oct 13, 2017  1:48 PM Ether Griffins B wrote: CRM for notification. See Telephone encounter for: 10/13/17.  Pt states she has a cold and cough and she would like something called in for the cough. Please send to [place], [place]

## 2017-10-13 NOTE — Telephone Encounter (Signed)
Please advise 

## 2017-10-15 ENCOUNTER — Telehealth: Payer: Self-pay | Admitting: Pharmacist

## 2017-10-15 MED ORDER — INSULIN GLARGINE 100 UNIT/ML SOLOSTAR PEN: 8.0000 [IU] | PEN_INJECTOR | Freq: Every day | SUBCUTANEOUS | 6 refills | Status: DC

## 2017-10-15 NOTE — Telephone Encounter (Signed)
Called patient to f/u on CBGs. Patient reports her CBGs are still very high. Denies hypoglycemia. Reports she is giving Humalog 6-8 units with meals averaging 2-3 shots/day. Patient reads the below CBGs from her meter to me over the phone.  4/17 - 246 7PM 4/18 - 328 before breakfast 4/22 - 298 before breakfast 4/23 - 452 930 pm  4/24 - 403 after supper 4/26 - 180 after breakfast   Patient reports increased appetite in the last several months. Discussed plan for basal insulin vs prandial insulin carefully with patient. Counseled on s/sx hypoglycemia. D/c humalog for now. Start Lantus 8 units daily. Test CBGs - FBG and 2 hrs after largest meal. Have avoided basal insulin recently due to h/o severe hypoglycemia during times of fasting. Will follow closely over the phone.   Carlean Jews, Pharm.D., BCPS PGY2 Ambulatory Care Pharmacy Resident Phone: [phone]

## 2017-10-15 NOTE — Telephone Encounter (Signed)
Agree with plan I have also reffered her to endocrine Dr Gabriel Carina  for further management Has she made an appt with them yet?

## 2017-10-16 ENCOUNTER — Telehealth: Payer: Self-pay

## 2017-10-16 NOTE — Telephone Encounter (Signed)
Copied from [place] [id number]. Topic: General - Other >> Oct 16, 2017 12:42 PM Yvette Rack wrote: Social Worker, Catalina Lunger, called in stating the pt is seeking access for more services at home through [facility]. Catalina Lunger will be faxing form over with details. Verified fax#. Cb# [phone]

## 2017-10-16 NOTE — Telephone Encounter (Signed)
Spoke with patient states she has itchy eyes, sore throat, feels like it is going into chest.

## 2017-10-17 ENCOUNTER — Encounter: Payer: Self-pay | Admitting: Family

## 2017-10-17 ENCOUNTER — Ambulatory Visit
Admission: RE | Admit: 2017-10-17 | Discharge: 2017-10-17 | Disposition: A | Discharge status: Home / Self Care | Payer: Medicare Other | Source: Ambulatory Visit | Attending: Family | Admitting: Family

## 2017-10-17 ENCOUNTER — Ambulatory Visit (INDEPENDENT_AMBULATORY_CARE_PROVIDER_SITE_OTHER): Payer: Medicare Other | Admitting: Family

## 2017-10-17 VITALS — BP 160/60 | HR 68 | Temp 98.3°F | Resp 16

## 2017-10-17 DIAGNOSIS — E1129 Type 2 diabetes mellitus with other diabetic kidney complication: Secondary | ICD-10-CM | POA: Diagnosis not present

## 2017-10-17 DIAGNOSIS — R252 Cramp and spasm: Secondary | ICD-10-CM

## 2017-10-17 DIAGNOSIS — Z794 Long term (current) use of insulin: Secondary | ICD-10-CM

## 2017-10-17 DIAGNOSIS — J0100 Acute maxillary sinusitis, unspecified: Secondary | ICD-10-CM | POA: Diagnosis present

## 2017-10-17 DIAGNOSIS — I10 Essential (primary) hypertension: Secondary | ICD-10-CM | POA: Diagnosis not present

## 2017-10-17 LAB — BASIC METABOLIC PANEL
BUN: 20 mg/dL (ref 6–23)
CO2: 26 mEq/L (ref 19–32)
Calcium: 9.1 mg/dL (ref 8.4–10.5)
Chloride: 100 mEq/L (ref 96–112)
Creatinine, Ser: 1.16 mg/dL (ref 0.40–1.20)
GFR: 56.75 mL/min — AB (ref >60.00)
Glucose, Bld: 98 mg/dL (ref 70–99)
POTASSIUM: 3.2 meq/L — AB (ref 3.5–5.1)
SODIUM: 136 meq/L (ref 135–145)

## 2017-10-17 MED ORDER — DOXYCYCLINE HYCLATE 100 MG PO TABS: 100.0000 mg | ORAL_TABLET | Freq: Two times a day (BID) | ORAL | 0 refills | Status: DC

## 2017-10-17 NOTE — Assessment & Plan Note (Addendum)
Continue to struggle with SBP with DBP being low.  Suspect worsened today by illness, cough. Referral placed to resistant hypertension clinic.  Reassured by normal neurologic exam today. She has dull headache, however no other signs to suggest hypertensive urgency or emergency at this time. I suspect HA may be from sinus pressure.  She will monitor BP at home.

## 2017-10-17 NOTE — Patient Instructions (Addendum)
Chest Xray  Start antibiotic, doxcycline  Start PLAIN mucinex and probiotics.  Plenty of water NO DECONGESTANTS as those will raise blood pressure  Today we discussed referrals, orders. Blood pressure clinic   I have placed these orders in the system for you.  Please be sure to give [place] a call if you have not heard from our office regarding scheduling a test or regarding referral in a timely manner.  It is very important that you let me know as soon as possible.   Let me know if not better

## 2017-10-17 NOTE — Telephone Encounter (Signed)
This has been done.

## 2017-10-17 NOTE — Telephone Encounter (Signed)
Called patient to follow up on CBGs since starting lantus. Patient reports she has not received lantus yet from the pharmacy. Called [facility] [employer] [facility] - requested that they fill the Lantus solostar and deliver to patient today. Called patient back and informed her of this. Confirmed her understanding of the plan to switch from humalog to lantus 8 units daily. Patient has been continuing humalog appropriately while waiting on lantus. States that she did not test CBG yesterday, will try to test today. She reports she has not heard from endocrinologist regarding referral yet.    Will follow up on Monday.   Carlean Jews, Pharm.D., BCPS PGY2 Ambulatory Care Pharmacy Resident Phone: [phone]

## 2017-10-17 NOTE — Progress Notes (Signed)
[facility] has a nephrology & hypertension clinic here in [place]. I can refer her there if you would like.

## 2017-10-17 NOTE — Assessment & Plan Note (Signed)
Intermittent.  No cramping during office visit.  Pending BMP.

## 2017-10-17 NOTE — Telephone Encounter (Signed)
I have fax from [facility] regarding lantus  Want to confirm- I do not need to anything at this point since you spoke with them caroline?

## 2017-10-17 NOTE — Progress Notes (Signed)
Subjective:    Patient ID: Tiffany Bailey, female    DOB: [birth year], 84 y.o.   MRN: [id number]  CC: NETTYE FLEGAL is a 84 y.o. female who presents today for an acute visit.    HPI: CC: 'allergies' x one week, little better. Feels like congestion 'is loosening up in throat.'  Cough has improved as well.   Endorses dry cough, sneezing,'dull' headache, thick and clear nasal congestion, sinus pressure, left ear pain. Episode of chills last week, resolved now. No sob, wheezing, fever.  Compliant with blood pressure medication. No CP, numbness.   Also complains of cramps in right hand for one week, intermittent. No cramp today.   DM- has started on lantus yet. Pharmacy is bringing tonight. Hasnt checked blood sugar today. Ate a pancake and bacon today. No hypoglycemic episodes.  Has appt with [facility] 10/23/2017.    HISTORY:  Past Medical History:  Diagnosis Date  . Anemia   . Anginal pain ([place])   . Arthritis   . Cancer (HCC)    BREAST  . CKD (chronic kidney disease), stage III ([place])   . Coronary artery disease    S/P CABG and multiple PCI's  . Diabetes mellitus without complication ([place])   . Edema   . Failure to thrive in adult   . GERD (gastroesophageal reflux disease)   . History of breast cancer   . HOH (hard of hearing)   . Hyperlipidemia   . Hypertension   . Myocardial infarction ([place])   . Palpitations    Past Surgical History:  Procedure Laterality Date  . ABDOMINAL HYSTERECTOMY    . ABOVE KNEE LEG AMPUTATION Left 2013  . ANTERIOR VITRECTOMY Left 11/16/2015   Procedure: ANTERIOR VITRECTOMY;  Surgeon: Eulogio Bear, MD;  Location: [facility] ORS;  Service: Ophthalmology;  Laterality: Left;  . BLADDER SURGERY    . CATARACT EXTRACTION W/PHACO Left 11/16/2015   Procedure: CATARACT EXTRACTION PHACO AND INTRAOCULAR LENS PLACEMENT (IOC);  Surgeon: Eulogio Bear, MD;  Location: [facility] ORS;  Service: Ophthalmology;  Laterality: Left;  Lot # [id number] H [place]; 01:22.8 AP%:12.6 CDE:  10.46  . CATARACT EXTRACTION W/PHACO Right 08/22/2016   Procedure: CATARACT EXTRACTION PHACO AND INTRAOCULAR LENS PLACEMENT (IOC);  Surgeon: Eulogio Bear, MD;  Location: [facility] ORS;  Service: Ophthalmology;  Laterality: Right;  Lot # [id number] H [place]: 00:52.1 AP%:8.7 CDE: 4.99   . CORONARY ANGIOPLASTY     STENT  . CORONARY ARTERY BYPASS GRAFT    . MASTECTOMY    . TUMOR REMOVAL     ABDOMINAL   Family History  Problem Relation Age of Onset  . Sudden death Mother   . Arthritis Father   . Stroke Father   . Breast cancer Sister   . Diabetes Grandchild     Allergies: Amoxicillin; Gabapentin; Mirtazapine; and Other Current Outpatient Medications on File Prior to Visit  Medication Sig Dispense Refill  . acetaminophen (TYLENOL) 500 MG tablet Take 1 tablet (500 mg total) by mouth every 6 (six) hours as needed. 30 tablet 0  . amLODipine (NORVASC) 10 MG tablet Take 1 tablet (10 mg total) by mouth daily. 90 tablet 0  . aspirin EC 81 MG tablet Take 1 tablet (81 mg total) by mouth daily. 30 tablet 5  . atorvastatin (LIPITOR) 40 MG tablet Take 1 tablet (40 mg total) by mouth daily. 90 tablet 0  . BD PEN NEEDLE NANO U/F 32G X 4 MM MISC USE 1 DAILY 90 each 1  . blood  glucose meter kit and supplies KIT Dispense based on patient and insurance preference. Use up to four times daily as directed. (FOR ICD-9 250.00, 250.01). 1 each 0  . clopidogrel (PLAVIX) 75 MG tablet TAKE 1 TABLET BY MOUTH  DAILY 90 tablet 3  . fluticasone (FLONASE) 50 MCG/ACT nasal spray Place 1 spray into both nostrils daily. 16 g 0  . glucose blood (ONE TOUCH TEST STRIPS) test strip Use to test blood sugar twice daily 200 each 3  . Insulin Glargine (LANTUS SOLOSTAR) 100 UNIT/ML Solostar Pen Inject 8 Units into the skin daily. 1 pen 6  . metoprolol tartrate (LOPRESSOR) 50 MG tablet Take 50 mg by mouth 2 (two) times daily.    . mirtazapine (REMERON) 15 MG tablet Take 1 tablet (15 mg total) by mouth at bedtime. 90 tablet 1  . Multiple  Vitamin (MULTIVITAMIN) tablet Take 1 tablet by mouth daily.    . ONE TOUCH LANCETS MISC Use as directed 2 times per day 200 each 3  . pantoprazole (PROTONIX) 40 MG tablet Take 1 tablet (40 mg total) by mouth 2 (two) times daily before a meal. (Patient taking differently: Take 40 mg by mouth daily. ) 180 tablet 0  . tizanidine (ZANAFLEX) 2 MG capsule Take 1 capsule (2 mg total) by mouth 3 (three) times daily. 30 capsule 0   No current facility-administered medications on file prior to visit.     Social History   Tobacco Use  . Smoking status: Never Smoker  . Smokeless tobacco: Never Used  Substance Use Topics  . Alcohol use: No  . Drug use: No    Review of Systems  Constitutional: Positive for chills (resolved). Negative for fever.  HENT: Positive for congestion, ear pain (left) and sinus pain. Negative for trouble swallowing.   Respiratory: Positive for cough. Negative for shortness of breath and wheezing.   Cardiovascular: Negative for chest pain and palpitations.  Gastrointestinal: Negative for nausea and vomiting.  Neurological: Positive for headaches. Negative for numbness.      Objective:    BP (!) 160/60   Pulse 68   Temp 98.3 F (36.8 C) (Oral)   Resp 16   SpO2 100%    Physical Exam  Constitutional: She appears well-developed and well-nourished.  HENT:  Head: Normocephalic and atraumatic.  Right Ear: Hearing, tympanic membrane, external ear and ear canal normal. No drainage, swelling or tenderness. No foreign bodies. Tympanic membrane is not erythematous and not bulging. No middle ear effusion. No decreased hearing is noted.  Left Ear: Hearing, tympanic membrane, external ear and ear canal normal. No drainage, swelling or tenderness. No foreign bodies. Tympanic membrane is not erythematous and not bulging.  No middle ear effusion. No decreased hearing is noted.  Nose: Rhinorrhea present. Right sinus exhibits no maxillary sinus tenderness and no frontal sinus  tenderness. Left sinus exhibits no maxillary sinus tenderness and no frontal sinus tenderness.  Mouth/Throat: Uvula is midline, oropharynx is clear and moist and mucous membranes are normal. No oropharyngeal exudate, posterior oropharyngeal edema, posterior oropharyngeal erythema or tonsillar abscesses.  Eyes: Conjunctivae are normal.  Cardiovascular: Regular rhythm, normal heart sounds and normal pulses.  Pulmonary/Chest: Effort normal and breath sounds normal. She has no wheezes. She has no rhonchi. She has no rales.  Lymphadenopathy:       Head (right side): No submental, no submandibular, no tonsillar, no preauricular, no posterior auricular and no occipital adenopathy present.       Head (left side): No submental, no  submandibular, no tonsillar, no preauricular, no posterior auricular and no occipital adenopathy present.    She has no cervical adenopathy.  Neurological: She is alert.  Skin: Skin is warm and dry.  Psychiatric: She has a normal mood and affect. Her speech is normal and behavior is normal. Thought content normal.  Vitals reviewed.      Assessment & Plan:    Problem List Items Addressed This Visit      Cardiovascular and Mediastinum   Essential hypertension    Continue to struggle with SBP with DBP being low.  Suspect worsened today by illness, cough. Referral placed to resistant hypertension clinic.  Reassured by normal neurologic exam today. She has dull headache, however no other signs to suggest hypertensive urgency or emergency at this time. I suspect HA may be from sinus pressure.  She will monitor BP at home.       Relevant Orders   Ambulatory referral to Cardiology     Respiratory   Acute non-recurrent maxillary sinusitis - Primary    Afebrile. She is not toxic in appearance.  sa02 100. Marland KitchenReassured her symptomas, her allergies and congestion, cough are all improving.  Based on duration of those symptoms, fragility of patient, will go ahead and start doxycycline  at this time.  Advised probiotics, mucinex.  Pending chest x-ray as well.  Patient let me know if she is not better.      Relevant Medications   doxycycline (VIBRA-TABS) 100 MG tablet   Other Relevant Orders   DG Chest 2 View     Endocrine   DM (diabetes mellitus) ([place])    To start lantus; pending endocrine consult. No hypoglycemic episodes.        Other   Hand cramp    Intermittent.  No cramping during office visit.  Pending BMP.      Relevant Orders   Basic metabolic panel       I am having Tiffany Bailey start on doxycycline. I am also having her maintain her multivitamin, pantoprazole, blood glucose meter kit and supplies, acetaminophen, aspirin EC, metoprolol tartrate, tizanidine, BD PEN NEEDLE NANO U/F, atorvastatin, amLODipine, clopidogrel, ONE TOUCH LANCETS, glucose blood, mirtazapine, fluticasone, and Insulin Glargine.   Meds ordered this encounter  Medications  . doxycycline (VIBRA-TABS) 100 MG tablet    Sig: Take 1 tablet (100 mg total) by mouth 2 (two) times daily.    Dispense:  14 tablet    Refill:  0    Order Specific Question:   Supervising Provider    Answer:   Crecencio Mc [[id number]]    Return precautions given.   Risks, benefits, and alternatives of the medications and treatment plan prescribed today were discussed, and patient expressed understanding.   Education regarding symptom management and diagnosis given to patient on AVS.  Continue to follow with [name], FNP for routine health maintenance.   Tiffany Bailey and I agreed with plan.   [name], FNP

## 2017-10-17 NOTE — Telephone Encounter (Signed)
Hey there,   See endo ref submitted.  When will we know if she has an appt?

## 2017-10-17 NOTE — Telephone Encounter (Signed)
Referral was submitted to [facility] on 4/23. They are still reviewing the chart and waiting on provider to let them know when to schedule her.

## 2017-10-17 NOTE — Telephone Encounter (Signed)
Nope, all taken care of. I confirmed with them d/c humalog, d/c lantus vials, only fill Lantus solostar (pens) 8 units daily  Carlean Jews, Pharm.D., BCPS PGY2 Ambulatory Care Pharmacy Resident Phone: [phone]

## 2017-10-17 NOTE — Assessment & Plan Note (Addendum)
Afebrile. She is not toxic in appearance.  sa02 100. Marland KitchenReassured her symptomas, her allergies and congestion, cough are all improving.  Based on duration of those symptoms, fragility of patient, will go ahead and start doxycycline at this time.  Advised probiotics, mucinex.  Pending chest x-ray as well.  Patient let me know if she is not better.

## 2017-10-17 NOTE — Assessment & Plan Note (Signed)
To start lantus; pending endocrine consult. No hypoglycemic episodes.

## 2017-10-18 ENCOUNTER — Other Ambulatory Visit
Admission: RE | Admit: 2017-10-18 | Discharge: 2017-10-18 | Disposition: A | Discharge status: Home / Self Care | Payer: Medicare Other | Source: Ambulatory Visit | Attending: Family | Admitting: Family

## 2017-10-18 ENCOUNTER — Other Ambulatory Visit: Payer: Self-pay | Admitting: Family

## 2017-10-18 DIAGNOSIS — E876 Hypokalemia: Secondary | ICD-10-CM | POA: Insufficient documentation

## 2017-10-18 LAB — BASIC METABOLIC PANEL
Anion gap: 10 (ref 5–15)
BUN: 19 mg/dL (ref 6–20)
CALCIUM: 8.6 mg/dL — AB (ref 8.9–10.3)
CO2: 23 mmol/L (ref 22–32)
Chloride: 101 mmol/L (ref 101–111)
Creatinine, Ser: 1.15 mg/dL — ABNORMAL HIGH (ref 0.44–1.00)
GFR calc Af Amer: 48 mL/min — ABNORMAL LOW (ref >60)
GFR, EST NON AFRICAN AMERICAN: 42 mL/min — AB (ref >60)
GLUCOSE: 318 mg/dL — AB (ref 65–99)
POTASSIUM: 3.5 mmol/L (ref 3.5–5.1)
SODIUM: 134 mmol/L — AB (ref 135–145)

## 2017-10-18 LAB — MAGNESIUM: MAGNESIUM: 1.6 mg/dL — AB (ref 1.7–2.4)

## 2017-10-20 ENCOUNTER — Telehealth: Payer: Self-pay | Admitting: Family

## 2017-10-20 NOTE — Telephone Encounter (Signed)
Made aware of low potassium Normal cxr She denied any N, V, D.  Agreed to go to [facility] for labs.

## 2017-10-20 NOTE — Telephone Encounter (Signed)
Called pt She is aware of normal potassium Ever so slightly low magnesium. I do no presume significant.  Patient feeling better on doxycycline and she will let us know if she doesn't continue to improve

## 2017-10-20 NOTE — Telephone Encounter (Addendum)
Left message to call for Gar Gibbon see below

## 2017-10-21 ENCOUNTER — Telehealth: Payer: Self-pay

## 2017-10-21 NOTE — Telephone Encounter (Signed)
Left voice mail to call back for [place] worker for [facility]

## 2017-10-21 NOTE — Telephone Encounter (Signed)
Copied from [place] [id number]. Topic: General - Other >> Oct 21, 2017 11:20 AM Yvette Rack wrote: Reason for CRM: Case worker Manager Catalina Lunger [phone] is returning  Pauline call

## 2017-10-22 ENCOUNTER — Ambulatory Visit: Payer: Medicare Other | Admitting: Family

## 2017-10-22 ENCOUNTER — Telehealth: Payer: Self-pay | Admitting: Pharmacist

## 2017-10-22 NOTE — Telephone Encounter (Signed)
Confirmed with Catalina Lunger form was received she forwarded to [facility]. For home services

## 2017-10-22 NOTE — Telephone Encounter (Signed)
Called patient to follow up on insulin change from humalog to lantus. Patient reports she still has not gotten it delivered. 3-way called patient's pharmacy (spoke with "[facility]") -they report they made an error and state that they will deliver it tonight. Patient re-iterates plan appropriately. Will follow up with her Friday for CBG readings.   Carlean Jews, Pharm.D., BCPS PGY2 Ambulatory Care Pharmacy Resident Phone: [phone]

## 2017-10-22 NOTE — Telephone Encounter (Signed)
Returned [employer] Case Workers see previous telephone encounter

## 2017-10-23 ENCOUNTER — Telehealth: Payer: Self-pay | Admitting: Family

## 2017-10-23 DIAGNOSIS — D571 Sickle-cell disease without crisis: Secondary | ICD-10-CM

## 2017-10-23 DIAGNOSIS — Z8679 Personal history of other diseases of the circulatory system: Secondary | ICD-10-CM | POA: Insufficient documentation

## 2017-10-23 DIAGNOSIS — I252 Old myocardial infarction: Secondary | ICD-10-CM | POA: Insufficient documentation

## 2017-10-23 DIAGNOSIS — I2510 Atherosclerotic heart disease of native coronary artery without angina pectoris: Secondary | ICD-10-CM | POA: Insufficient documentation

## 2017-10-23 HISTORY — DX: Sickle-cell disease without crisis: D57.1

## 2017-10-23 NOTE — Telephone Encounter (Signed)
Endocrinology called to confirm patient taking 8 units of Lantus daily, confirmed dosage . Also wanted advise PCP they are having patient maintain the lantus and monitor CBG's for 2 weeks and then follow up with endocrinology. FYI

## 2017-10-23 NOTE — Telephone Encounter (Signed)
Noted. Thanks.

## 2017-10-24 NOTE — Telephone Encounter (Signed)
Called patient to follow up on CBGs. Patient reports she started lantus yesterday, FBG 105 this morning. Confirms d/c humalog. Patient states she has another appnt with endo in 3 weeks. Will sign off but provided patient my phone number in case issues arise.   Carlean Jews, Pharm.D., BCPS PGY2 Ambulatory Care Pharmacy Resident Phone: [phone]

## 2017-10-24 NOTE — Telephone Encounter (Signed)
noted 

## 2017-10-31 ENCOUNTER — Ambulatory Visit: Payer: Medicare Other

## 2017-11-14 ENCOUNTER — Telehealth: Payer: Self-pay | Admitting: Family

## 2017-11-14 DIAGNOSIS — E1159 Type 2 diabetes mellitus with other circulatory complications: Secondary | ICD-10-CM

## 2017-11-14 NOTE — Telephone Encounter (Signed)
Copied from [place] [phone]. Topic: Quick Communication - See Telephone Encounter >> Nov 14, 2017  2:36 PM Arletha Grippe wrote: CRM for notification. See Telephone encounter for: 11/14/17. Pt called - she is asking for rx for wheelchair, and also depends.  Cb is [phone] Pt says her current wheel chair keeps messing up

## 2017-11-14 NOTE — Telephone Encounter (Signed)
Please advise 

## 2017-11-17 NOTE — Telephone Encounter (Signed)
Written Not sure where you should fax?

## 2017-11-17 NOTE — Telephone Encounter (Signed)
Left voice mail for patient to call back ok for [facility] to speak to patient    

## 2017-11-18 NOTE — Telephone Encounter (Signed)
Spoke with patient she states that someone will call from [employer] to request that we fax to them.   Placed in forms awaiting to be completed folder.

## 2017-11-19 ENCOUNTER — Ambulatory Visit: Payer: Self-pay | Admitting: *Deleted

## 2017-11-19 ENCOUNTER — Other Ambulatory Visit: Payer: Self-pay | Admitting: Family
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
## 2017-11-19 NOTE — Telephone Encounter (Signed)
Call pt Please ensure she knows she is seeing MELISSA tomorrow. ( is that here?) I had referred her to endocrine due to labile blood sugars- did she not hear from [place]??  Is she not eating meals when she gives herself insulin??  Please advise that she reduce her lantus in half until she can be seen by [facility]- appears she is on your schedule tomorrow/
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
## 2017-11-19 NOTE — Telephone Encounter (Signed)
Patient is going to be seen by o'sullivan tomorrow. FYI

## 2017-11-19 NOTE — Telephone Encounter (Signed)
FYI appointment with you tomorrow

## 2017-11-20 ENCOUNTER — Ambulatory Visit (INDEPENDENT_AMBULATORY_CARE_PROVIDER_SITE_OTHER): Payer: Medicare Other | Admitting: Family

## 2017-11-20 ENCOUNTER — Encounter: Payer: Self-pay | Admitting: Family

## 2017-11-20 VITALS — BP 160/46 | HR 50 | Temp 98.4°F | Wt 80.5 lb

## 2017-11-20 DIAGNOSIS — I10 Essential (primary) hypertension: Secondary | ICD-10-CM | POA: Diagnosis not present

## 2017-11-20 DIAGNOSIS — L03211 Cellulitis of face: Secondary | ICD-10-CM | POA: Diagnosis not present

## 2017-11-20 DIAGNOSIS — E162 Hypoglycemia, unspecified: Secondary | ICD-10-CM

## 2017-11-20 DIAGNOSIS — E11649 Type 2 diabetes mellitus with hypoglycemia without coma: Secondary | ICD-10-CM | POA: Diagnosis not present

## 2017-11-20 DIAGNOSIS — R55 Syncope and collapse: Secondary | ICD-10-CM | POA: Diagnosis not present

## 2017-11-20 DIAGNOSIS — R001 Bradycardia, unspecified: Secondary | ICD-10-CM | POA: Diagnosis not present

## 2017-11-20 LAB — CBC WITH DIFFERENTIAL/PLATELET
BASOS PCT: 0.5 % (ref 0.0–3.0)
Basophils Absolute: 0 10*3/uL (ref 0.0–0.1)
EOS ABS: 0 10*3/uL (ref 0.0–0.7)
EOS PCT: 0.6 % (ref 0.0–5.0)
HEMATOCRIT: 30.1 % — AB (ref 36.0–46.0)
Hemoglobin: 10.2 g/dL — ABNORMAL LOW (ref 12.0–15.0)
Lymphocytes Relative: 39 % (ref 12.0–46.0)
Lymphs Abs: 1.8 10*3/uL (ref 0.7–4.0)
MCHC: 33.8 g/dL (ref 30.0–36.0)
MCV: 90.5 fl (ref 78.0–100.0)
Monocytes Absolute: 0.4 10*3/uL (ref 0.1–1.0)
Monocytes Relative: 7.8 % (ref 3.0–12.0)
NEUTROS ABS: 2.4 10*3/uL (ref 1.4–7.7)
Neutrophils Relative %: 52.1 % (ref 43.0–77.0)
Platelets: 273 10*3/uL (ref 150.0–400.0)
RBC: 3.32 Mil/uL — ABNORMAL LOW (ref 3.87–5.11)
RDW: 15 % (ref 11.5–15.5)
WBC: 4.6 10*3/uL (ref 4.0–10.5)

## 2017-11-20 LAB — COMPREHENSIVE METABOLIC PANEL
ALT: 23 U/L (ref 0–35)
AST: 28 U/L (ref 0–37)
Albumin: 3.9 g/dL (ref 3.5–5.2)
Alkaline Phosphatase: 100 U/L (ref 39–117)
BUN: 23 mg/dL (ref 6–23)
CHLORIDE: 104 meq/L (ref 96–112)
CO2: 23 meq/L (ref 19–32)
Calcium: 9.4 mg/dL (ref 8.4–10.5)
Creatinine, Ser: 1.16 mg/dL (ref 0.40–1.20)
GFR: 56.74 mL/min — ABNORMAL LOW (ref >60.00)
Glucose, Bld: 215 mg/dL — ABNORMAL HIGH (ref 70–99)
POTASSIUM: 4.1 meq/L (ref 3.5–5.1)
SODIUM: 136 meq/L (ref 135–145)
Total Bilirubin: 0.6 mg/dL (ref 0.2–1.2)
Total Protein: 6.8 g/dL (ref 6.0–8.3)

## 2017-11-20 LAB — TSH: TSH: 1.6 u[IU]/mL (ref 0.35–4.50)

## 2017-11-20 MED ORDER — INSULIN GLARGINE 100 UNIT/ML SOLOSTAR PEN: 4.0000 [IU] | PEN_INJECTOR | Freq: Every day | SUBCUTANEOUS | 6 refills | Status: DC

## 2017-11-20 MED ORDER — LISINOPRIL 10 MG PO TABS: 10.0000 mg | ORAL_TABLET | Freq: Every day | ORAL | 0 refills | Status: DC

## 2017-11-20 MED ORDER — DOXYCYCLINE HYCLATE 100 MG PO TABS: 100.0000 mg | ORAL_TABLET | Freq: Two times a day (BID) | ORAL | 0 refills | Status: DC

## 2017-11-20 NOTE — Progress Notes (Signed)
Subjective:    Patient ID: Tiffany Bailey, female    DOB: 1928/05/12, 84 y.o.   MRN: [id number]  HPI  Tiffany Bailey is an 84 yr old female who presents today following a syncopal event.   She reports that she started insulin a few months back.  She did not take her insulin yesterday. Did take it 2 days ago. Reports that she was driving and passed out. "They had to call a rescue squad." Reports that rescue squad told her that her sugar was in the 40's.  She was given IV glucose on scene. Reports that she declined to go to the hospital.  Daughter picked her up. This occurred around 1 PM. She took her insulin that morning.  She had a small bowl of cornflakes at 8:30-9:00.   Lab Results  Component Value Date   HGBA1C 9.3 (H) 08/27/2017     She saw Endo on 11/17/17 and they recommended that she add glipizide 2.70m each morning to her lantus 8 units each evening.  She has not started the glipizide.  She has not checked her sugar this morning. She reports that she feels "a little tired and sleepy."  She had pork chops at 12 noon yesterday a small amount of chicken at 5pm.    She lives with her son.    HTN- maintained on metoprolol and amlodipine  BP Readings from Last 3 Encounters:  11/20/17 (!) 160/46  10/17/17 (!) 160/60  09/29/17 (!) 155/60   Skin rash- seeing derm for topical treatment of skin cancer. Now having itching/swelling redness. Was applying a "Salve" but she was told to discontinue.   Review of Systems See HPI  Past Medical History:  Diagnosis Date  . Anemia   . Anginal pain ([place]   . Arthritis   . Cancer (HCC)    BREAST  . CKD (chronic kidney disease), stage III ([place]   . Coronary artery disease    S/P CABG and multiple PCI's  . Diabetes mellitus without complication ([place]   . Edema   . Failure to thrive in adult   . GERD (gastroesophageal reflux disease)   . History of breast cancer   . HOH (hard of hearing)   . Hyperlipidemia   . Hypertension   . Myocardial  infarction ([place]   . Palpitations      Social History   Socioeconomic History  . Marital status: Divorced    Spouse name: Not on file  . Number of children: Not on file  . Years of education: Not on file  . Highest education level: Not on file  Occupational History  . Not on file  Social Needs  . Financial resource strain: Not on file  . Food insecurity:    Worry: Not on file    Inability: Not on file  . Transportation needs:    Medical: Not on file    Non-medical: Not on file  Tobacco Use  . Smoking status: Never Smoker  . Smokeless tobacco: Never Used  Substance and Sexual Activity  . Alcohol use: No  . Drug use: No  . Sexual activity: Not Currently  Lifestyle  . Physical activity:    Days per week: Not on file    Minutes per session: Not on file  . Stress: Not on file  Relationships  . Social connections:    Talks on phone: Not on file    Gets together: Not on file    Attends religious service: Not on file  Active member of club or organization: Not on file    Attends meetings of clubs or organizations: Not on file    Relationship status: Not on file  . Intimate partner violence:    Fear of current or ex partner: Not on file    Emotionally abused: Not on file    Physically abused: Not on file    Forced sexual activity: Not on file  Other Topics Concern  . Not on file  Social History Narrative  . Not on file    Past Surgical History:  Procedure Laterality Date  . ABDOMINAL HYSTERECTOMY    . ABOVE KNEE LEG AMPUTATION Left 2013  . ANTERIOR VITRECTOMY Left 11/16/2015   Procedure: ANTERIOR VITRECTOMY;  Surgeon: Eulogio Bear, MD;  Location: [facility] ORS;  Service: Ophthalmology;  Laterality: Left;  . BLADDER SURGERY    . CATARACT EXTRACTION W/PHACO Left 11/16/2015   Procedure: CATARACT EXTRACTION PHACO AND INTRAOCULAR LENS PLACEMENT (IOC);  Surgeon: Eulogio Bear, MD;  Location: [facility] ORS;  Service: Ophthalmology;  Laterality: Left;  Lot # [id number] H [place];  01:22.8 AP%:12.6 CDE: 10.46  . CATARACT EXTRACTION W/PHACO Right 08/22/2016   Procedure: CATARACT EXTRACTION PHACO AND INTRAOCULAR LENS PLACEMENT (IOC);  Surgeon: Eulogio Bear, MD;  Location: [facility] ORS;  Service: Ophthalmology;  Laterality: Right;  Lot # [id number] H [place]: 00:52.1 AP%:8.7 CDE: 4.99   . CORONARY ANGIOPLASTY     STENT  . CORONARY ARTERY BYPASS GRAFT    . MASTECTOMY    . TUMOR REMOVAL     ABDOMINAL    Family History  Problem Relation Age of Onset  . Sudden death Mother   . Arthritis Father   . Stroke Father   . Breast cancer Sister   . Diabetes Grandchild     Allergies  Allergen Reactions  . Amoxicillin Hives  . Gabapentin Other (See Comments)    AMS  . Mirtazapine Itching    Dry mouth with ODT Denies SOB with medication  . Other     UNKNOWN PAIN MEDICATION - unknown reaction    Current Outpatient Medications on File Prior to Visit  Medication Sig Dispense Refill  . acetaminophen (TYLENOL) 500 MG tablet Take 1 tablet (500 mg total) by mouth every 6 (six) hours as needed. 30 tablet 0  . amLODipine (NORVASC) 10 MG tablet Take 1 tablet (10 mg total) by mouth daily. 90 tablet 0  . aspirin EC 81 MG tablet Take 1 tablet (81 mg total) by mouth daily. 30 tablet 5  . atorvastatin (LIPITOR) 40 MG tablet Take 1 tablet (40 mg total) by mouth daily. 90 tablet 0  . BD PEN NEEDLE NANO U/F 32G X 4 MM MISC USE 1 DAILY 90 each 1  . blood glucose meter kit and supplies KIT Dispense based on patient and insurance preference. Use up to four times daily as directed. (FOR ICD-9 250.00, 250.01). 1 each 0  . clopidogrel (PLAVIX) 75 MG tablet TAKE 1 TABLET BY MOUTH  DAILY 90 tablet 3  . fluticasone (FLONASE) 50 MCG/ACT nasal spray Place 1 spray into both nostrils daily. 16 g 0  . glucose blood (ONE TOUCH TEST STRIPS) test strip Use to test blood sugar twice daily 200 each 3  . Insulin Glargine (LANTUS SOLOSTAR) 100 UNIT/ML Solostar Pen Inject 8 Units into the skin daily. 1 pen 6  .  metoprolol tartrate (LOPRESSOR) 50 MG tablet Take 50 mg by mouth 2 (two) times daily.    . mirtazapine (REMERON) 15 MG tablet  Take 1 tablet (15 mg total) by mouth at bedtime. 90 tablet 1  . Multiple Vitamin (MULTIVITAMIN) tablet Take 1 tablet by mouth daily.    . ONE TOUCH LANCETS MISC Use as directed 2 times per day 200 each 3  . pantoprazole (PROTONIX) 40 MG tablet Take 1 tablet (40 mg total) by mouth 2 (two) times daily before a meal. (Patient taking differently: Take 40 mg by mouth daily. ) 180 tablet 0  . tizanidine (ZANAFLEX) 2 MG capsule Take 1 capsule (2 mg total) by mouth 3 (three) times daily. 30 capsule 0  . GLIPIZIDE XL 2.5 MG 24 hr tablet      No current facility-administered medications on file prior to visit.     BP (!) 160/46 (BP Location: Right Arm, Patient Position: Sitting, Cuff Size: Normal)   Pulse (!) 50   Temp 98.4 F (36.9 C) (Oral)   Wt 80 lb 8 oz (36.5 kg)   SpO2 99%   BMI 15.72 kg/m       Objective:   Physical Exam  Constitutional: She appears well-developed and well-nourished.  Eyes: EOM are normal.  Cardiovascular: Normal rate, regular rhythm and normal heart sounds.  No murmur heard. Pulmonary/Chest: Effort normal and breath sounds normal. No respiratory distress. She has no wheezes.  Musculoskeletal:  L AKA  Neurological:  Bilateral UE strength 5/5 RLE strength 5/5, LLE unable to assess due to AKA  Psychiatric: She has a normal mood and affect. Her behavior is normal. Judgment and thought content normal.           Assessment & Plan:  DM2- uncontrolled.  Advised pt as follows:   Do not start glipizide. Decrease lantus to 4 units once daily. DO NOT skip meals.  Eat a protein/carbohydrate/ fruit or veggie 3 times daily.  Check sugar 2 times daily and bring your readings to your next appointment.  No Driving until you are cleared to drive by endocrinology and Joycelyn Schmid your primary care provider.  HTN- BP uncontrolled and + bradycardia.   Advised pt as follows:  Decrease metoprolol 50 mg to 1/2 tab twice daily  Add lisinopril 72m once daily for blood pressure.  Cellulitis- rx with doxycycline- advised pt to call if increased redness/swelling or if not improved in 2-3 days.   Syncope- refer for CT head/2 D echo, carotids.  EKG tracing is personally reviewed.  EKG notes NSR (sinus brady). No acute changes.  Significant LVH

## 2017-11-20 NOTE — Patient Instructions (Addendum)
Complete lab work prior to leaving.  For Diabetes:   Do not start glipizide. Decrease lantus to 4 units once daily. DO NOT skip meals.  Eat a protein/carbohydrate/ fruit or veggie 3 times daily.  Check sugar 2 times daily and bring your readings to your next appointment.  No Driving until you are cleared to drive by endocrinology and Joycelyn Schmid your primary care provider.  For Blood pressure:   Decrease metoprolol 50 mg to 1/2 tab twice daily  Add lisinopril 10mg  once daily for blood pressure.  For Skin rash/infection:  Begin Doxycycline twice daily (antibiotic). Call if increased redness/swelling pain on your face or if not improve in 2-3 days.  Due to passing out:  You should be contacted about scheduling a CT of your head to check for stroke, and an ultrasound of your heart and carotids.

## 2017-11-20 NOTE — Telephone Encounter (Signed)
I think this was meant to be sent to you since she is on your schedule today.

## 2017-11-20 NOTE — Telephone Encounter (Signed)
Noted  

## 2017-11-21 ENCOUNTER — Ambulatory Visit: Payer: Medicare Other | Admitting: Family

## 2017-11-22 ENCOUNTER — Telehealth: Payer: Self-pay | Admitting: Family

## 2017-11-22 DIAGNOSIS — R55 Syncope and collapse: Secondary | ICD-10-CM

## 2017-11-22 NOTE — Telephone Encounter (Signed)
Please advise pt that her lab work shows stale anemia.  Electrolytes and kidney function look good. Sugar is elevated.  After further review, I would also like to refer her to cardiology.

## 2017-11-22 NOTE — Telephone Encounter (Signed)
-----   Message from Mosie Lukes, MD sent at 11/20/2017  9:51 PM EDT ----- LVH is noted would refer to cardiology for further consideration. Dr B ----- Message ----- From: Debbrah Alar, NP Sent: 11/20/2017   2:33 PM To: Mosie Lukes, MD  Hi,  Do you mind glancing at her EKG from today.  Came in following a hypoglycemic syncopal event. EKG looked ok to me, just bradycardic/LVH.  I adjusted her beta blocker/insulin and ordered a syncopal work up.  Just wanted to make sure you agreed with my EKG interpretation please.  Thanks,  [employer]

## 2017-11-24 ENCOUNTER — Telehealth: Payer: Self-pay

## 2017-11-24 NOTE — Telephone Encounter (Signed)
That is correct 

## 2017-11-24 NOTE — Telephone Encounter (Signed)
Spoke with Cyril Mourning at [facility] and advised to discontinue Lisinopril 2.5mg   Orders below.

## 2017-11-24 NOTE — Telephone Encounter (Signed)
Received fax want to clarify they are to discontinue Lisinopril 2.5mg  new medication order lisinopril 10 mg qd. Please advise.

## 2017-11-25 NOTE — Telephone Encounter (Signed)
Calling on pt behalf to follow up on form faxed 6/4 for mobility chair. Please call to advise on status of completion.

## 2017-11-25 NOTE — Telephone Encounter (Signed)
Please advise 

## 2017-11-26 ENCOUNTER — Telehealth: Payer: Self-pay | Admitting: Family

## 2017-11-26 NOTE — Telephone Encounter (Signed)
Spoke with patient appointment scheduled  

## 2017-11-26 NOTE — Telephone Encounter (Signed)
Call pt She needs to set up a mobility assessment appt per Fredderick Phenix for motorized wheelchair We have paperwork we can complete at time of visit

## 2017-11-26 NOTE — Telephone Encounter (Signed)
Patient is scheduled for exam for motorized wheelchair

## 2017-12-03 ENCOUNTER — Ambulatory Visit (INDEPENDENT_AMBULATORY_CARE_PROVIDER_SITE_OTHER): Payer: Medicare Other | Admitting: Family

## 2017-12-03 VITALS — BP 160/50 | HR 55 | Temp 98.1°F | Resp 16

## 2017-12-03 DIAGNOSIS — Z794 Long term (current) use of insulin: Secondary | ICD-10-CM

## 2017-12-03 DIAGNOSIS — I739 Peripheral vascular disease, unspecified: Secondary | ICD-10-CM

## 2017-12-03 DIAGNOSIS — I10 Essential (primary) hypertension: Secondary | ICD-10-CM | POA: Diagnosis not present

## 2017-12-03 DIAGNOSIS — E1129 Type 2 diabetes mellitus with other diabetic kidney complication: Secondary | ICD-10-CM

## 2017-12-03 NOTE — Progress Notes (Signed)
Subjective:    Patient ID: Tiffany Bailey, female    DOB: 28-Jan-1928, 84 y.o.   MRN: [id number]  CC: Tiffany Bailey is a 84 y.o. female who presents today for mobility assessment   HPI: Face to face mobility assessment.   Patient would like automatic wheelchair.   DM- seeing Dr Gabriel Carina. Complains of LE neuropathy. On 4 units lantus, no more hypoglycemic episodes since decreasing to 4 units. Skips insulin if doesn't eat. Had cereal this morning, also fish, chicken wings, a french fries for lunch.   HTN- compliant with medications. Decreased metoprolol at last visit. Takes prn aleve, not daily.   At home 150/50.   Denies exertional chest pain or pressure, numbness or tingling radiating to left arm or jaw, palpitations, dizziness, frequent headaches, changes in vision, or shortness of breath.   No follow-up with [facility] cardiologist in a long time.     HISTORY:  Past Medical History:  Diagnosis Date  . Anemia   . Anginal pain ([place])   . Arthritis   . Cancer (HCC)    BREAST  . CKD (chronic kidney disease), stage III ([place])   . Coronary artery disease    S/P CABG and multiple PCI's  . Diabetes mellitus without complication ([place])   . Edema   . Failure to thrive in adult   . GERD (gastroesophageal reflux disease)   . History of breast cancer   . HOH (hard of hearing)   . Hyperlipidemia   . Hypertension   . Myocardial infarction ([place])   . Palpitations    Past Surgical History:  Procedure Laterality Date  . ABDOMINAL HYSTERECTOMY    . ABOVE KNEE LEG AMPUTATION Left 2013  . ANTERIOR VITRECTOMY Left 11/16/2015   Procedure: ANTERIOR VITRECTOMY;  Surgeon: Eulogio Bear, MD;  Location: [facility] ORS;  Service: Ophthalmology;  Laterality: Left;  . BLADDER SURGERY    . CATARACT EXTRACTION W/PHACO Left 11/16/2015   Procedure: CATARACT EXTRACTION PHACO AND INTRAOCULAR LENS PLACEMENT (IOC);  Surgeon: Eulogio Bear, MD;  Location: [facility] ORS;  Service: Ophthalmology;  Laterality: Left;  Lot #  [id number] H [place]; 01:22.8 AP%:12.6 CDE: 10.46  . CATARACT EXTRACTION W/PHACO Right 08/22/2016   Procedure: CATARACT EXTRACTION PHACO AND INTRAOCULAR LENS PLACEMENT (IOC);  Surgeon: Eulogio Bear, MD;  Location: [facility] ORS;  Service: Ophthalmology;  Laterality: Right;  Lot # [id number] H [place]: 00:52.1 AP%:8.7 CDE: 4.99   . CORONARY ANGIOPLASTY     STENT  . CORONARY ARTERY BYPASS GRAFT    . MASTECTOMY    . TUMOR REMOVAL     ABDOMINAL   Family History  Problem Relation Age of Onset  . Sudden death Mother   . Arthritis Father   . Stroke Father   . Breast cancer Sister   . Diabetes Grandchild     Allergies: Amoxicillin; Gabapentin; Mirtazapine; and Other Current Outpatient Medications on File Prior to Visit  Medication Sig Dispense Refill  . acetaminophen (TYLENOL) 500 MG tablet Take 1 tablet (500 mg total) by mouth every 6 (six) hours as needed. 30 tablet 0  . amLODipine (NORVASC) 10 MG tablet Take 1 tablet (10 mg total) by mouth daily. 90 tablet 0  . aspirin EC 81 MG tablet Take 1 tablet (81 mg total) by mouth daily. 30 tablet 5  . atorvastatin (LIPITOR) 40 MG tablet Take 1 tablet (40 mg total) by mouth daily. 90 tablet 0  . BD PEN NEEDLE NANO U/F 32G X 4 MM MISC USE 1 DAILY  90 each 1  . blood glucose meter kit and supplies KIT Dispense based on patient and insurance preference. Use up to four times daily as directed. (FOR ICD-9 250.00, 250.01). 1 each 0  . clopidogrel (PLAVIX) 75 MG tablet TAKE 1 TABLET BY MOUTH  DAILY 90 tablet 3  . fluticasone (FLONASE) 50 MCG/ACT nasal spray Place 1 spray into both nostrils daily. 16 g 0  . glucose blood (ONE TOUCH TEST STRIPS) test strip Use to test blood sugar twice daily 200 each 3  . Insulin Glargine (LANTUS SOLOSTAR) 100 UNIT/ML Solostar Pen Inject 4 Units into the skin daily. 1 pen 6  . lisinopril (PRINIVIL,ZESTRIL) 10 MG tablet Take 1 tablet (10 mg total) by mouth daily. 30 tablet 0  . metoprolol tartrate (LOPRESSOR) 50 MG tablet Take 25 mg by  mouth 2 (two) times daily.    . mirtazapine (REMERON) 15 MG tablet Take 1 tablet (15 mg total) by mouth at bedtime. 90 tablet 1  . naproxen sodium (ALEVE) 220 MG tablet Take by mouth.    . ONE TOUCH LANCETS MISC Use as directed 2 times per day 200 each 3  . pantoprazole (PROTONIX) 40 MG tablet Take 1 tablet (40 mg total) by mouth 2 (two) times daily before a meal. (Patient taking differently: Take 40 mg by mouth daily. ) 180 tablet 0  . tizanidine (ZANAFLEX) 2 MG capsule Take 1 capsule (2 mg total) by mouth 3 (three) times daily. 30 capsule 0   No current facility-administered medications on file prior to visit.     Social History   Tobacco Use  . Smoking status: Never Smoker  . Smokeless tobacco: Never Used  Substance Use Topics  . Alcohol use: No  . Drug use: No    Review of Systems  Constitutional: Negative for chills and fever.  Respiratory: Negative for cough and shortness of breath.   Cardiovascular: Negative for chest pain, palpitations and leg swelling.  Gastrointestinal: Negative for nausea and vomiting.  Neurological: Positive for numbness (LE). Negative for dizziness, syncope (no further episodes since last appointment) and headaches.      Objective:    BP (!) 160/50 (BP Location: Left Arm, Patient Position: Sitting, Cuff Size: Normal)   Pulse (!) 55   Temp 98.1 F (36.7 C) (Oral)   Resp 16   SpO2 98%  BP Readings from Last 3 Encounters:  12/03/17 (!) 160/50  11/20/17 (!) 160/46  10/17/17 (!) 160/60   Wt Readings from Last 3 Encounters:  11/20/17 80 lb 8 oz (36.5 kg)  09/18/17 83 lb 4 oz (37.8 kg)  07/17/17 83 lb (37.6 kg)    Physical Exam  Constitutional: She appears well-developed and well-nourished.  Eyes: Conjunctivae are normal.  Cardiovascular: Normal rate, regular rhythm, normal heart sounds and normal pulses.  Pulmonary/Chest: Effort normal and breath sounds normal. She has no wheezes. She has no rhonchi. She has no rales.  Neurological: She is  alert.  Strength 5/5 BUE RLE 3/5. LLE - above knee 3/5 Sensation intact BLE.   Skin: Skin is warm and dry.  Psychiatric: She has a normal mood and affect. Her speech is normal and behavior is normal. Thought content normal.  Vitals reviewed.      Assessment & Plan:   Problem List Items Addressed This Visit      Cardiovascular and Mediastinum   PAD (peripheral artery disease) ([place]) - Primary (Chronic)    Chronc. Left AKA 2013 due to PVD. Needs PWC to improve mobiity  and complete ADLs timely, such as eating, bathing, feeding, toileting and to remain independent with in her home.  Patient is both physically and mentally capable of operating all controls on a power wheelchair. A cane, walker, POV, and manuel wheelchair have been tried and ruled out because she has a left above-knee amputation.       Essential hypertension    Unchanged. Declines labs today after starting lisinopril.  HR unchanged with decrease in beta blocker. SBP remains elevated. Limited by low diastolic. Pending Echo (from [facility] Consult cardiology for help with managing blood pressure. In interim,  Patient will keep blood pressure log and call with readings.       Relevant Orders   Ambulatory referral to Cardiology   Basic Metabolic Panel (BMET)     Endocrine   DM (diabetes mellitus) ([place])    Uncontrolled. No further hypoglycemic episodes. Following with [facility]; will follow.           I have discontinued Lilyan Gilford multivitamin and doxycycline. I am also having her maintain her pantoprazole, blood glucose meter kit and supplies, acetaminophen, aspirin EC, metoprolol tartrate, tizanidine, BD PEN NEEDLE NANO U/F, atorvastatin, amLODipine, clopidogrel, ONE TOUCH LANCETS, glucose blood, mirtazapine, fluticasone, lisinopril, Insulin Glargine, and naproxen sodium.   No orders of the defined types were placed in this encounter.   Return precautions given.   Risks, benefits, and alternatives of the  medications and treatment plan prescribed today were discussed, and patient expressed understanding.   Education regarding symptom management and diagnosis given to patient on AVS.  Continue to follow with [name], FNP for routine health maintenance.   Tiffany Bailey and I agreed with plan.   [name], FNP

## 2017-12-03 NOTE — Assessment & Plan Note (Addendum)
Chronc. Left AKA 2013 due to PVD. Needs PWC to improve mobiity and complete ADLs timely, such as eating, bathing, feeding, toileting and to remain independent with in her home.  Patient is both physically and mentally capable of operating all controls on a power wheelchair. A cane, walker, POV, and manuel wheelchair have been tried and ruled out because she has a left above-knee amputation.

## 2017-12-03 NOTE — Assessment & Plan Note (Addendum)
Unchanged. Declines labs today after starting lisinopril.  HR unchanged with decrease in beta blocker. SBP remains elevated. Limited by low diastolic. Pending Echo (from [facility] Consult cardiology for help with managing blood pressure. In interim,  Patient will keep blood pressure log and call with readings.

## 2017-12-03 NOTE — Assessment & Plan Note (Signed)
Uncontrolled. No further hypoglycemic episodes. Following with [facility]; will follow.

## 2017-12-03 NOTE — Patient Instructions (Addendum)
We will complete form for wheelchair  Today we discussed referrals, orders. CARDIOLOGY. I have placed these orders in the system for you.  Please be sure to give [place] a call if you have not heard from our office regarding scheduling a test or regarding referral in a timely manner.  It is very important that you let me know as soon as possible.    Please keep monitor of blood sugar- call with any lows. Dont skip meals Please monitor blood pressure and call with readings above 160/80. Or if bottom number less than 50.

## 2017-12-04 ENCOUNTER — Ambulatory Visit: Payer: Medicare Other | Attending: Family

## 2017-12-05 ENCOUNTER — Telehealth: Payer: Self-pay | Admitting: Family

## 2017-12-05 NOTE — Telephone Encounter (Signed)
Copied from [place] [id number]. Topic: Quick Communication - See Telephone Encounter >> Dec 05, 2017  1:17 PM Bea Graff, NT wrote: CRM for notification. See Telephone encounter for: 12/05/17. Lorriane Shire with [employer] calling and states she received the face to face record for this pt yesterday but the notes are not signed and dated. May do an electronic signature. Sign and date on page 9. She will hold off on sending to medicare until notes are signed. CB#: [phone]

## 2017-12-09 NOTE — Telephone Encounter (Signed)
Placed in your folder for signature 

## 2017-12-09 NOTE — Telephone Encounter (Signed)
Please advise 

## 2017-12-10 ENCOUNTER — Ambulatory Visit: Payer: Self-pay | Admitting: *Deleted

## 2017-12-10 ENCOUNTER — Other Ambulatory Visit: Payer: Self-pay | Admitting: Family

## 2017-12-10 ENCOUNTER — Other Ambulatory Visit: Payer: Self-pay | Admitting: Family Medicine

## 2017-12-10 ENCOUNTER — Telehealth: Payer: Self-pay | Admitting: Family

## 2017-12-10 DIAGNOSIS — E785 Hyperlipidemia, unspecified: Secondary | ICD-10-CM

## 2017-12-10 DIAGNOSIS — J3089 Other allergic rhinitis: Secondary | ICD-10-CM

## 2017-12-10 MED ORDER — FLUTICASONE PROPIONATE 50 MCG/ACT NA SUSP
1.0000 | Freq: Every day | NASAL | 5 refills | Status: DC
Start: 2017-12-10 — End: 2018-07-08

## 2017-12-10 NOTE — Telephone Encounter (Signed)
Pt called with hearing her heart beat in her ears esp at night. Seems to be happening more now. States that it has been going on for about 2 to 3 weeks. Has some dizziness when she wakes up in the morning but goes away.  This has happened years ago. Denies headache, chest pain, nausea, vomiting, weakness on one side of the body or other. She has an appointment coming with a cardiologist next week and would rather wait before making another appointment to see her pcp. Pt advised that if she starts having more symptoms to call back, pt voiced understanding. Will route to flow at [facility].  Reason for Disposition . [1] Palpitations AND [2] no improvement after following Care Advice  Answer Assessment - Initial Assessment Questions 1. DESCRIPTION: "Please describe your heart rate or heart beat that you are having" (e.g., fast/slow, regular/irregular, skipped or extra beats, "palpitations")     Fast and pounding 2. ONSET: "When did it start?" (Minutes, hours or days)      2 to 3 weeks now 3. DURATION: "How long does it last" (e.g., seconds, minutes, hours)    hours 4. PATTERN "Does it come and go, or has it been constant since it started?"  "Does it get worse with exertion?"   "Are you feeling it now?"     Worst at night and now happening more during the day 5. TAP: "Using your hand, can you tap out what you are feeling on a chair or table in front of you, so that I can hear?" (Note: not all patients can do this)       Can not 6. HEART RATE: "Can you tell me your heart rate?" "How many beats in 15 seconds?"  (Note: not all patients can do this)       Can not 7. RECURRENT SYMPTOM: "Have you ever had this before?" If so, ask: "When was the last time?" and "What happened that time?"      Years ago 9. CARDIAC HISTORY: "Do you have any history of heart disease?" (e.g., heart attack, angina, bypass surgery, angioplasty, arrhythmia)      Triple bypass, heart attack 10. OTHER SYMPTOMS:  "Do you have any other symptoms?" (e.g., dizziness, chest pain, sweating, difficulty breathing)       Dizziness in the mornings when the pounding is going on  Protocols used: [place]

## 2017-12-10 NOTE — Telephone Encounter (Signed)
Request for Flonase 50 MCG/ACT nasal spray LOV  12/03/17 NOV  02/02/18  [name], NP [place]

## 2017-12-10 NOTE — Telephone Encounter (Signed)
Copied from [place] [phone]. Topic: Quick Communication - See Telephone Encounter >> Dec 10, 2017  9:13 AM Conception Chancy, NT wrote: CRM for notification. See Telephone encounter for: 12/10/17.  Patient is calling and requesting a refill on fluticasone (FLONASE) 50 MCG/ACT nasal spray please advise.  [place], [place] - [place] [address] Phone: [phone] Fax: [phone]

## 2017-12-10 NOTE — Telephone Encounter (Signed)
Script sent, patient advised.  

## 2017-12-10 NOTE — Telephone Encounter (Signed)
Patient was triaged by [facility] nurse, and voiced understanding of recommendations.

## 2017-12-19 ENCOUNTER — Encounter: Payer: Self-pay | Admitting: Cardiovascular Disease

## 2017-12-19 ENCOUNTER — Ambulatory Visit (INDEPENDENT_AMBULATORY_CARE_PROVIDER_SITE_OTHER): Payer: Medicare Other | Admitting: Cardiovascular Disease

## 2017-12-19 VITALS — BP 140/52 | HR 59 | Ht 60.0 in | Wt 82.0 lb

## 2017-12-19 DIAGNOSIS — R0989 Other specified symptoms and signs involving the circulatory and respiratory systems: Secondary | ICD-10-CM | POA: Diagnosis not present

## 2017-12-19 DIAGNOSIS — I10 Essential (primary) hypertension: Secondary | ICD-10-CM

## 2017-12-19 DIAGNOSIS — K219 Gastro-esophageal reflux disease without esophagitis: Secondary | ICD-10-CM

## 2017-12-19 DIAGNOSIS — I6523 Occlusion and stenosis of bilateral carotid arteries: Secondary | ICD-10-CM | POA: Diagnosis not present

## 2017-12-19 DIAGNOSIS — E1122 Type 2 diabetes mellitus with diabetic chronic kidney disease: Secondary | ICD-10-CM

## 2017-12-19 DIAGNOSIS — N183 Chronic kidney disease, stage 3 (moderate): Secondary | ICD-10-CM

## 2017-12-19 DIAGNOSIS — I25118 Atherosclerotic heart disease of native coronary artery with other forms of angina pectoris: Secondary | ICD-10-CM | POA: Diagnosis not present

## 2017-12-19 DIAGNOSIS — R55 Syncope and collapse: Secondary | ICD-10-CM | POA: Diagnosis not present

## 2017-12-19 DIAGNOSIS — E1159 Type 2 diabetes mellitus with other circulatory complications: Secondary | ICD-10-CM | POA: Diagnosis not present

## 2017-12-19 DIAGNOSIS — Z794 Long term (current) use of insulin: Secondary | ICD-10-CM | POA: Diagnosis not present

## 2017-12-19 DIAGNOSIS — I6529 Occlusion and stenosis of unspecified carotid artery: Secondary | ICD-10-CM | POA: Diagnosis not present

## 2017-12-19 DIAGNOSIS — R011 Cardiac murmur, unspecified: Secondary | ICD-10-CM

## 2017-12-19 DIAGNOSIS — I739 Peripheral vascular disease, unspecified: Secondary | ICD-10-CM | POA: Diagnosis not present

## 2017-12-19 MED ORDER — NITROGLYCERIN 0.4 MG SL SUBL: 0.4000 mg | SUBLINGUAL_TABLET | SUBLINGUAL | 4 refills | Status: DC | PRN

## 2017-12-19 MED ORDER — EZETIMIBE 10 MG PO TABS: 10.0000 mg | ORAL_TABLET | Freq: Every day | ORAL | 3 refills | Status: DC
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
## 2017-12-19 NOTE — Patient Instructions (Addendum)
Medication Instructions:   Please start zetia one a day  Labwork:  No new labs needed  Testing/Procedures:  We will schedule a carotid u/s and echocardiogram   Follow-Up: It was a pleasure seeing you in the office today. Please call us if you have new issues that need to be addressed before your next appt.  [phone]  Your physician wants you to follow-up in: 6 months.  You will receive a reminder letter in the mail two months in advance. If you don't receive a letter, please call our office to schedule the follow-up appointment.  If you need a refill on your cardiac medications before your next appointment, please call your pharmacy.  For educational health videos Log in to : [email or web address] Or : [email or web address], password : [email or web address]

## 2017-12-26 ENCOUNTER — Other Ambulatory Visit: Payer: Self-pay | Admitting: Family Medicine

## 2017-12-31 ENCOUNTER — Other Ambulatory Visit: Payer: Medicare Other

## 2018-01-01 LAB — HM DIABETES EYE EXAM

## 2018-01-05 ENCOUNTER — Telehealth: Payer: Self-pay

## 2018-01-05 DIAGNOSIS — L609 Nail disorder, unspecified: Secondary | ICD-10-CM

## 2018-01-05 NOTE — Telephone Encounter (Signed)
Copied from [place] [phone]. Topic: Referral - Request >> Jan 05, 2018  2:33 PM Lennox Solders wrote: Reason for CRM:pt would like a referral to dermatologist for ingrown toenail

## 2018-01-07 NOTE — Telephone Encounter (Signed)
Call pt Referral to podiartry is more appropriate for in grown toe nail  Please ensure okay with patient; advise to call us if hasnt heard from [place] in a week.   If pain is worsening or concern for infection, advise UC as  They can remove in grown toe nail.

## 2018-01-07 NOTE — Telephone Encounter (Signed)
Called patient back, she states that her foot is very sore hurts to walk on .   Advised her since she was diabetic she should go get evaluated today. She was advised of where local urgent care was . Informed patient we would contact her with appointment information regarding referral.

## 2018-01-08 ENCOUNTER — Other Ambulatory Visit: Payer: Self-pay | Admitting: Cardiovascular Disease

## 2018-01-08 DIAGNOSIS — R55 Syncope and collapse: Secondary | ICD-10-CM

## 2018-01-09 ENCOUNTER — Other Ambulatory Visit: Payer: Self-pay

## 2018-01-09 ENCOUNTER — Ambulatory Visit (INDEPENDENT_AMBULATORY_CARE_PROVIDER_SITE_OTHER): Payer: Medicare Other

## 2018-01-09 DIAGNOSIS — R0989 Other specified symptoms and signs involving the circulatory and respiratory systems: Secondary | ICD-10-CM | POA: Diagnosis not present

## 2018-01-09 DIAGNOSIS — R011 Cardiac murmur, unspecified: Secondary | ICD-10-CM | POA: Diagnosis not present

## 2018-01-09 DIAGNOSIS — R55 Syncope and collapse: Secondary | ICD-10-CM

## 2018-01-09 DIAGNOSIS — I6529 Occlusion and stenosis of unspecified carotid artery: Secondary | ICD-10-CM

## 2018-01-15 ENCOUNTER — Other Ambulatory Visit: Payer: Self-pay

## 2018-01-15 NOTE — Telephone Encounter (Signed)
Decreased by Lenna Sciara NP on 11/20/17 NOV 02/09/18

## 2018-01-16 MED ORDER — METOPROLOL TARTRATE 25 MG PO TABS: 25.0000 mg | ORAL_TABLET | Freq: Two times a day (BID) | ORAL | 2 refills | Status: DC

## 2018-01-16 NOTE — Telephone Encounter (Signed)
Call pt  I sent in rx for metoprolol 25mg  BID  PRIOR she had been on metoprolol 50mg  and was SPLITTING the tablet. She doesn't need to split the tablet anymore. Please ensure she is aware of this  Please ensure she has a f/u scheduled with [place]

## 2018-01-22 NOTE — Telephone Encounter (Signed)
Patient advised of below and verbalized understanding.  

## 2018-02-02 ENCOUNTER — Ambulatory Visit: Payer: Medicare Other | Admitting: Family

## 2018-02-09 ENCOUNTER — Encounter: Payer: Self-pay | Admitting: Family

## 2018-02-09 ENCOUNTER — Ambulatory Visit (INDEPENDENT_AMBULATORY_CARE_PROVIDER_SITE_OTHER): Payer: Medicare Other | Admitting: Family

## 2018-02-09 VITALS — BP 124/60 | HR 60 | Temp 97.9°F | Resp 16 | Ht 60.0 in | Wt 80.0 lb

## 2018-02-09 DIAGNOSIS — N183 Chronic kidney disease, stage 3 unspecified: Secondary | ICD-10-CM

## 2018-02-09 DIAGNOSIS — E1159 Type 2 diabetes mellitus with other circulatory complications: Secondary | ICD-10-CM

## 2018-02-09 DIAGNOSIS — K219 Gastro-esophageal reflux disease without esophagitis: Secondary | ICD-10-CM | POA: Diagnosis not present

## 2018-02-09 DIAGNOSIS — E1122 Type 2 diabetes mellitus with diabetic chronic kidney disease: Secondary | ICD-10-CM

## 2018-02-09 DIAGNOSIS — Z794 Long term (current) use of insulin: Secondary | ICD-10-CM

## 2018-02-09 DIAGNOSIS — E785 Hyperlipidemia, unspecified: Secondary | ICD-10-CM

## 2018-02-09 DIAGNOSIS — I10 Essential (primary) hypertension: Secondary | ICD-10-CM | POA: Diagnosis not present

## 2018-02-09 LAB — BASIC METABOLIC PANEL
BUN: 20 mg/dL (ref 6–23)
CALCIUM: 9.6 mg/dL (ref 8.4–10.5)
CO2: 27 mEq/L (ref 19–32)
Chloride: 102 mEq/L (ref 96–112)
Creatinine, Ser: 1.19 mg/dL (ref 0.40–1.20)
GFR: 55.06 mL/min — AB (ref >60.00)
GLUCOSE: 379 mg/dL — AB (ref 70–99)
Potassium: 4.5 mEq/L (ref 3.5–5.1)
SODIUM: 135 meq/L (ref 135–145)

## 2018-02-09 MED ORDER — EZETIMIBE 10 MG PO TABS: 10.0000 mg | ORAL_TABLET | Freq: Every day | ORAL | 3 refills | Status: DC

## 2018-02-09 MED ORDER — PANTOPRAZOLE SODIUM 40 MG PO TBEC: 40.0000 mg | DELAYED_RELEASE_TABLET | Freq: Every day | ORAL | 1 refills | Status: DC

## 2018-02-09 NOTE — Progress Notes (Signed)
Subjective:    Patient ID: Tiffany Bailey, female    DOB: 08-28-1927, 84 y.o.   MRN: [id number]  CC: Tiffany Bailey is a 84 y.o. female who presents today for follow up.   HPI: Here with grandson  HTN- taking amlodipine, lopressor. Doesn't have lisinopril and not taking.   Denies exertional chest pain or pressure, numbness or tingling radiating to left arm or jaw, palpitations, dizziness, frequent headaches, changes in vision, or shortness of breath.   DM- following with Dr Gabriel Carina; last A1c 8.7 11/2017. FBG 80. No hypoglycemic episodes since visit decreasing lantus to 4 units.        Dr Rockey Situ 12/2017- BP 140/52; added zetia; carotid US, echo showed normal cardiac function  HISTORY:  Past Medical History:  Diagnosis Date  . Anemia   . Anginal pain ([place])   . Arthritis   . Cancer (HCC)    BREAST  . CKD (chronic kidney disease), stage III ([place])   . Coronary artery disease    S/P CABG and multiple PCI's  . Diabetes mellitus without complication ([place])   . Edema   . Failure to thrive in adult   . GERD (gastroesophageal reflux disease)   . History of breast cancer   . HOH (hard of hearing)   . Hyperlipidemia   . Hypertension   . Myocardial infarction ([place])   . Palpitations    Past Surgical History:  Procedure Laterality Date  . ABDOMINAL HYSTERECTOMY    . ABOVE KNEE LEG AMPUTATION Left 2013  . ANTERIOR VITRECTOMY Left 11/16/2015   Procedure: ANTERIOR VITRECTOMY;  Surgeon: Eulogio Bear, MD;  Location: [facility] ORS;  Service: Ophthalmology;  Laterality: Left;  . BLADDER SURGERY    . CATARACT EXTRACTION W/PHACO Left 11/16/2015   Procedure: CATARACT EXTRACTION PHACO AND INTRAOCULAR LENS PLACEMENT (IOC);  Surgeon: Eulogio Bear, MD;  Location: [facility] ORS;  Service: Ophthalmology;  Laterality: Left;  Lot # [id number] H [place]; 01:22.8 AP%:12.6 CDE: 10.46  . CATARACT EXTRACTION W/PHACO Right 08/22/2016   Procedure: CATARACT EXTRACTION PHACO AND INTRAOCULAR LENS PLACEMENT (IOC);  Surgeon:  Eulogio Bear, MD;  Location: [facility] ORS;  Service: Ophthalmology;  Laterality: Right;  Lot # [id number] H [place]: 00:52.1 AP%:8.7 CDE: 4.99   . CORONARY ANGIOPLASTY     STENT  . CORONARY ARTERY BYPASS GRAFT    . MASTECTOMY    . TUMOR REMOVAL     ABDOMINAL   Family History  Problem Relation Age of Onset  . Sudden death Mother   . Arthritis Father   . Stroke Father   . Breast cancer Sister   . Diabetes Grandchild     Allergies: Amoxicillin; Gabapentin; Mirtazapine; and Other Current Outpatient Medications on File Prior to Visit  Medication Sig Dispense Refill  . acetaminophen (TYLENOL) 500 MG tablet Take 1 tablet (500 mg total) by mouth every 6 (six) hours as needed. 30 tablet 0  . amLODipine (NORVASC) 10 MG tablet TAKE 1 TABLET BY MOUTH  DAILY 90 tablet 1  . aspirin EC 81 MG tablet Take 1 tablet (81 mg total) by mouth daily. 30 tablet 5  . atorvastatin (LIPITOR) 40 MG tablet TAKE 1 TABLET BY MOUTH  DAILY 90 tablet 1  . BD PEN NEEDLE NANO U/F 32G X 4 MM MISC USE 1 DAILY 90 each 1  . blood glucose meter kit and supplies KIT Dispense based on patient and insurance preference. Use up to four times daily as directed. (FOR ICD-9 250.00, 250.01). 1 each 0  .  clopidogrel (PLAVIX) 75 MG tablet TAKE 1 TABLET BY MOUTH  DAILY 90 tablet 3  . fluticasone (FLONASE) 50 MCG/ACT nasal spray Place 1 spray into both nostrils daily. 16 g 5  . glucose blood (ONE TOUCH TEST STRIPS) test strip Use to test blood sugar twice daily 200 each 3  . Insulin Glargine (LANTUS SOLOSTAR) 100 UNIT/ML Solostar Pen Inject 4 Units into the skin daily. 1 pen 6  . metoprolol tartrate (LOPRESSOR) 25 MG tablet Take 1 tablet (25 mg total) by mouth 2 (two) times daily. 120 tablet 2  . nitroGLYCERIN (NITROSTAT) 0.4 MG SL tablet Place 1 tablet (0.4 mg total) under the tongue every 5 (five) minutes as needed for chest pain. 25 tablet 4  . omega-3 acid ethyl esters (LOVAZA) 1 g capsule Take by mouth daily.    . ONE TOUCH LANCETS MISC  Use as directed 2 times per day 200 each 3   No current facility-administered medications on file prior to visit.     Social History   Tobacco Use  . Smoking status: Never Smoker  . Smokeless tobacco: Never Used  Substance Use Topics  . Alcohol use: No  . Drug use: No    Review of Systems  Constitutional: Negative for chills and fever.  Eyes: Negative for visual disturbance.  Respiratory: Negative for cough.   Cardiovascular: Negative for chest pain and palpitations.  Gastrointestinal: Negative for nausea and vomiting.  Neurological: Negative for headaches.      Objective:    BP 124/60   Pulse 60   Temp 97.9 F (36.6 C) (Oral)   Resp 16   Ht 5' (1.524 m)   Wt 80 lb (36.3 kg)   SpO2 99%   BMI 15.62 kg/m  BP Readings from Last 3 Encounters:  02/09/18 124/60  12/19/17 (!) 140/52  12/03/17 (!) 160/50   Wt Readings from Last 3 Encounters:  02/09/18 80 lb (36.3 kg)  12/19/17 82 lb (37.2 kg)  11/20/17 80 lb 8 oz (36.5 kg)    Physical Exam  Constitutional: She appears well-developed and well-nourished.  Eyes: Conjunctivae are normal.  Cardiovascular: Normal rate, regular rhythm, normal heart sounds and normal pulses.  Pulmonary/Chest: Effort normal and breath sounds normal. She has no wheezes. She has no rhonchi. She has no rales.  Musculoskeletal:  Left BKA  Neurological: She is alert.  Skin: Skin is warm and dry.  Psychiatric: She has a normal mood and affect. Her speech is normal and behavior is normal. Thought content normal.  Vitals reviewed.      Assessment & Plan:   Problem List Items Addressed This Visit      Cardiovascular and Mediastinum   Essential hypertension    Well controlled on lopressor, amlodipine. Will continue. CrtCl 23; hold ACE for now. At max, she would need to be on 35m. Will consult nephrology, awaiting response.       Relevant Medications   omega-3 acid ethyl esters (LOVAZA) 1 g capsule   ezetimibe (ZETIA) 10 MG tablet   Other  Relevant Orders   Basic metabolic panel   Urine Microalbumin w/creat. ratio     Digestive   GERD (gastroesophageal reflux disease)   Relevant Medications   pantoprazole (PROTONIX) 40 MG tablet     Endocrine   DM (diabetes mellitus) ([place]    Following with endocrine. No recent hypoglycemic episodes. Will follow      CKD stage 3 due to type 2 diabetes mellitus ([place]   Relevant Orders  Basic metabolic panel     Other   Hyperlipidemia - Primary   Relevant Medications   omega-3 acid ethyl esters (LOVAZA) 1 g capsule   ezetimibe (ZETIA) 10 MG tablet       I have discontinued Nohemi W. Upchurch's lisinopril, naproxen sodium, and mirtazapine. I have also changed her pantoprazole. Additionally, I am having her maintain her blood glucose meter kit and supplies, acetaminophen, aspirin EC, BD PEN NEEDLE NANO U/F, clopidogrel, ONE TOUCH LANCETS, glucose blood, Insulin Glargine, amLODipine, atorvastatin, fluticasone, nitroGLYCERIN, metoprolol tartrate, omega-3 acid ethyl esters, and ezetimibe.   Meds ordered this encounter  Medications  . DISCONTD: ezetimibe (ZETIA) 10 MG tablet    Sig: Take 1 tablet (10 mg total) by mouth daily.    Dispense:  90 tablet    Refill:  3    Order Specific Question:   Supervising Provider    Answer:   Deborra Medina L [[id number]]  . pantoprazole (PROTONIX) 40 MG tablet    Sig: Take 1 tablet (40 mg total) by mouth daily.    Dispense:  90 tablet    Refill:  1    Order Specific Question:   Supervising Provider    Answer:   Deborra Medina L [[id number]]  . ezetimibe (ZETIA) 10 MG tablet    Sig: Take 1 tablet (10 mg total) by mouth daily.    Dispense:  90 tablet    Refill:  3    Order Specific Question:   Supervising Provider    Answer:   Crecencio Mc [[id number]]    Return precautions given.   Risks, benefits, and alternatives of the medications and treatment plan prescribed today were discussed, and patient expressed understanding.   Education regarding symptom  management and diagnosis given to patient on AVS.  Continue to follow with [name], FNP for routine health maintenance.   Tiffany Bailey and I agreed with plan.   [name], FNP

## 2018-02-09 NOTE — Assessment & Plan Note (Addendum)
Well controlled on lopressor, amlodipine. Will continue. CrtCl 23; hold ACE for now. At max, she would need to be on 5mg . Will consult nephrology, awaiting response.

## 2018-02-09 NOTE — Assessment & Plan Note (Signed)
Following with endocrine. No recent hypoglycemic episodes. Will follow

## 2018-02-09 NOTE — Patient Instructions (Addendum)
For blood pressure, as discussed, I would like you to stay on amlodipine, Lopressor.  Please stop lisinopril.    Based on your kidney function, I am uncomfortable with you being on lisinopril at this time.  Will send message to the kidney doctor, nephrology, and get their advice as well.    However as of now, your blood pressure is adequate controlled, on your current regimen without lisinopril.  Please ensure that neither of your pharmacies refill lisinopril.   Managing Your Hypertension Hypertension is commonly called high blood pressure. This is when the force of your blood pressing against the walls of your arteries is too strong. Arteries are blood vessels that carry blood from your heart throughout your body. Hypertension forces the heart to work harder to pump blood, and may cause the arteries to become narrow or stiff. Having untreated or uncontrolled hypertension can cause heart attack, stroke, kidney disease, and other problems. What are blood pressure readings? A blood pressure reading consists of a higher number over a lower number. Ideally, your blood pressure should be below 120/80. The first ("top") number is called the systolic pressure. It is a measure of the pressure in your arteries as your heart beats. The second ("bottom") number is called the diastolic pressure. It is a measure of the pressure in your arteries as the heart relaxes. What does my blood pressure reading mean? Blood pressure is classified into four stages. Based on your blood pressure reading, your health care provider may use the following stages to determine what type of treatment you need, if any. Systolic pressure and diastolic pressure are measured in a unit called mm Hg. Normal  Systolic pressure: below 161.  Diastolic pressure: below 80. Elevated  Systolic pressure: 096-045.  Diastolic pressure: below 80. Hypertension stage 1  Systolic pressure: 409-811.  Diastolic pressure: 91-47. Hypertension  stage 2  Systolic pressure: 829 or above.  Diastolic pressure: 90 or above. What health risks are associated with hypertension? Managing your hypertension is an important responsibility. Uncontrolled hypertension can lead to:  A heart attack.  A stroke.  A weakened blood vessel (aneurysm).  Heart failure.  Kidney damage.  Eye damage.  Metabolic syndrome.  Memory and concentration problems.  What changes can I make to manage my hypertension? Hypertension can be managed by making lifestyle changes and possibly by taking medicines. Your health care provider will help you make a plan to bring your blood pressure within a normal range. Eating and drinking  Eat a diet that is high in fiber and potassium, and low in salt (sodium), added sugar, and fat. An example eating plan is called the DASH (Dietary Approaches to Stop Hypertension) diet. To eat this way: ? Eat plenty of fresh fruits and vegetables. Try to fill half of your plate at each meal with fruits and vegetables. ? Eat whole grains, such as whole wheat pasta, brown rice, or whole grain bread. Fill about one quarter of your plate with whole grains. ? Eat low-fat diary products. ? Avoid fatty cuts of meat, processed or cured meats, and poultry with skin. Fill about one quarter of your plate with lean proteins such as fish, chicken without skin, beans, eggs, and tofu. ? Avoid premade and processed foods. These tend to be higher in sodium, added sugar, and fat.  Reduce your daily sodium intake. Most people with hypertension should eat less than 1,500 mg of sodium a day.  Limit alcohol intake to no more than 1 drink a day for nonpregnant  women and 2 drinks a day for men. One drink equals 12 oz of beer, 5 oz of wine, or 1 oz of hard liquor. Lifestyle  Work with your health care provider to maintain a healthy body weight, or to lose weight. Ask what an ideal weight is for you.  Get at least 30 minutes of exercise that causes  your heart to beat faster (aerobic exercise) most days of the week. Activities may include walking, swimming, or biking.  Include exercise to strengthen your muscles (resistance exercise), such as weight lifting, as part of your weekly exercise routine. Try to do these types of exercises for 30 minutes at least 3 days a week.  Do not use any products that contain nicotine or tobacco, such as cigarettes and e-cigarettes. If you need help quitting, ask your health care provider.  Control any long-term (chronic) conditions you have, such as high cholesterol or diabetes. Monitoring  Monitor your blood pressure at home as told by your health care provider. Your personal target blood pressure may vary depending on your medical conditions, your age, and other factors.  Have your blood pressure checked regularly, as often as told by your health care provider. Working with your health care provider  Review all the medicines you take with your health care provider because there may be side effects or interactions.  Talk with your health care provider about your diet, exercise habits, and other lifestyle factors that may be contributing to hypertension.  Visit your health care provider regularly. Your health care provider can help you create and adjust your plan for managing hypertension. Will I need medicine to control my blood pressure? Your health care provider may prescribe medicine if lifestyle changes are not enough to get your blood pressure under control, and if:  Your systolic blood pressure is 130 or higher.  Your diastolic blood pressure is 80 or higher.  Take medicines only as told by your health care provider. Follow the directions carefully. Blood pressure medicines must be taken as prescribed. The medicine does not work as well when you skip doses. Skipping doses also puts you at risk for problems. Contact a health care provider if:  You think you are having a reaction to medicines  you have taken.  You have repeated (recurrent) headaches.  You feel dizzy.  You have swelling in your ankles.  You have trouble with your vision. Get help right away if:  You develop a severe headache or confusion.  You have unusual weakness or numbness, or you feel faint.  You have severe pain in your chest or abdomen.  You vomit repeatedly.  You have trouble breathing. Summary  Hypertension is when the force of blood pumping through your arteries is too strong. If this condition is not controlled, it may put you at risk for serious complications.  Your personal target blood pressure may vary depending on your medical conditions, your age, and other factors. For most people, a normal blood pressure is less than 120/80.  Hypertension is managed by lifestyle changes, medicines, or both. Lifestyle changes include weight loss, eating a healthy, low-sodium diet, exercising more, and limiting alcohol. This information is not intended to replace advice given to you by your health care provider. Make sure you discuss any questions you have with your health care provider. Document Released: 02/26/2012 Document Revised: 05/01/2016 Document Reviewed: 05/01/2016 Elsevier Interactive Patient Education  [employer].

## 2018-03-12 IMAGING — US US EXTREM LOW DUPLEX ARTERIAL*L* LIMITED
2 series · 13 of 25 positions shown · non-contrast
Comparison: CT 11/04/2014.

No prior noninvasive vascular study

CLINICAL DATA: 88-year-old female with a history of left leg pain.

Prior above knee amputation [id number]
EXAM:
UNILATERAL LEFT LOWER EXTREMITY ARTERIAL DUPLEX SCAN
TECHNIQUE: Directed duplex of the left lower extremity performed.

[Series 1: us extrem low duplex arterial*left* limited · 0.05mm/px · 9 of 18 slices shown (1 of 2)]
[im 1/18]
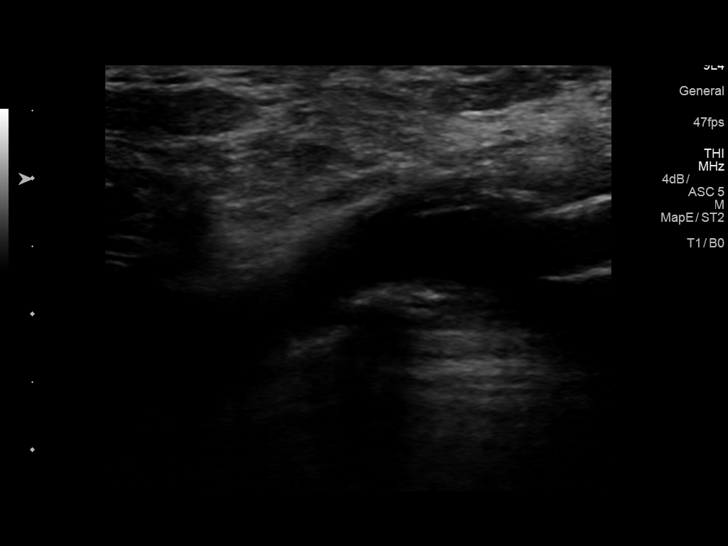
[im 3/18]
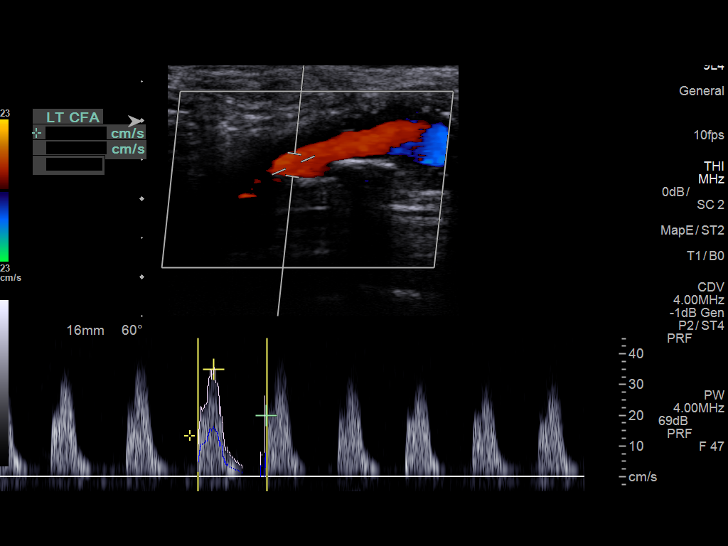
[im 5/18]
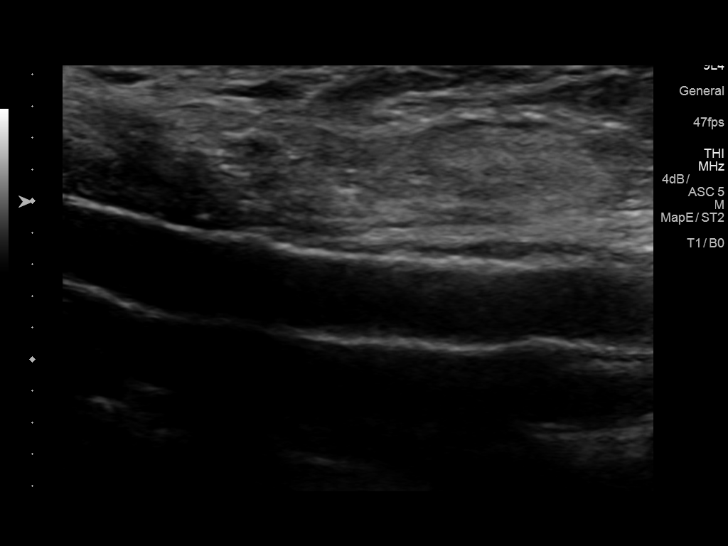
[im 7/18]
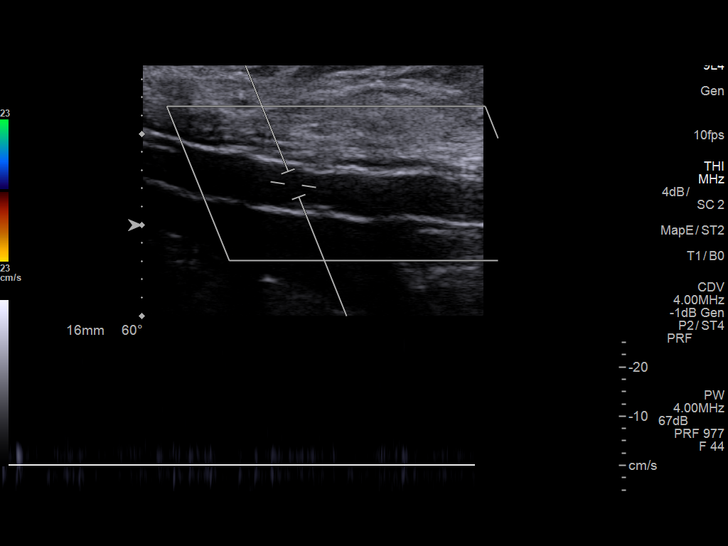
[im 9/18]
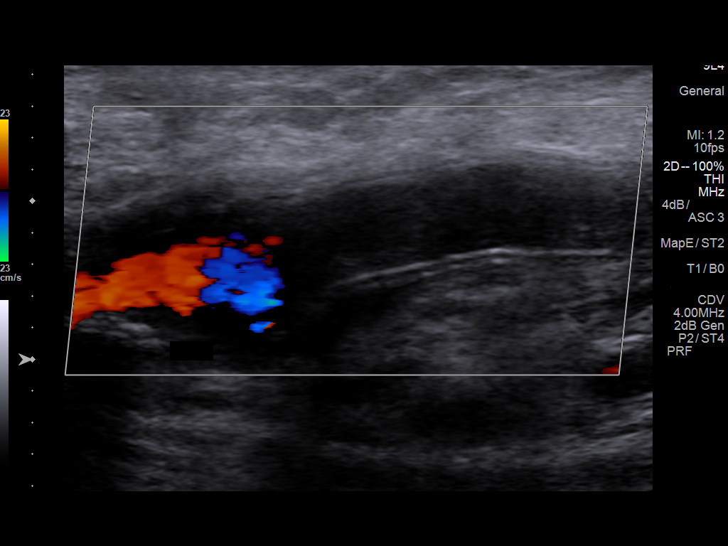
[im 11/18]
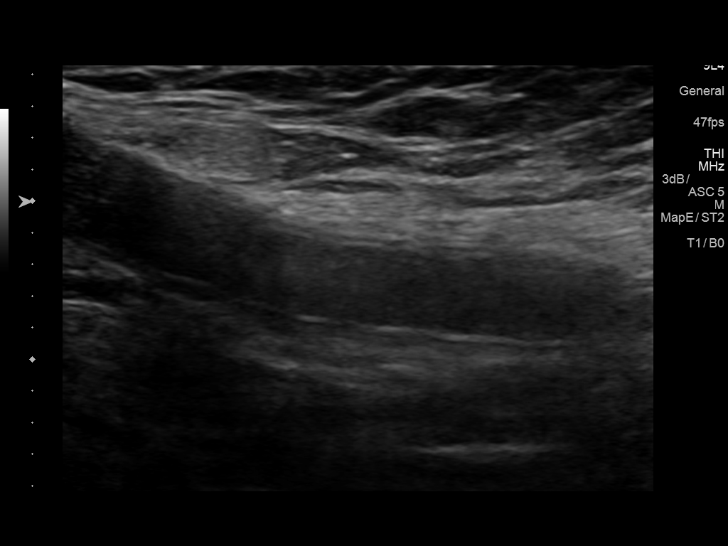
[im 13/18]
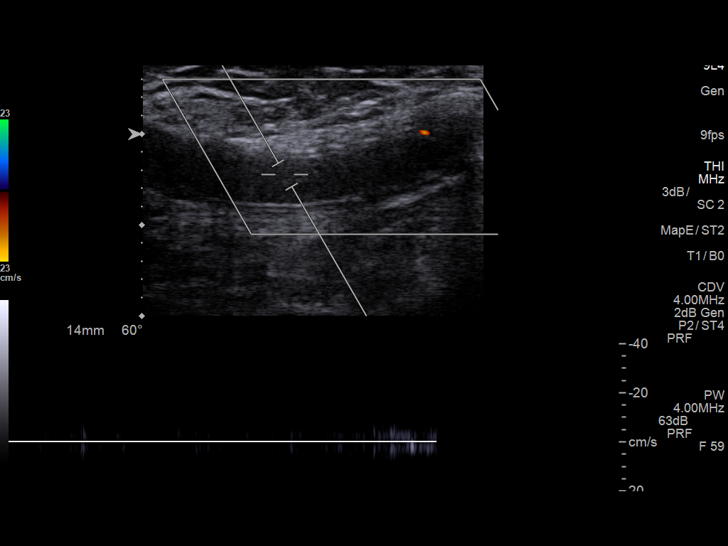
[im 15/18]
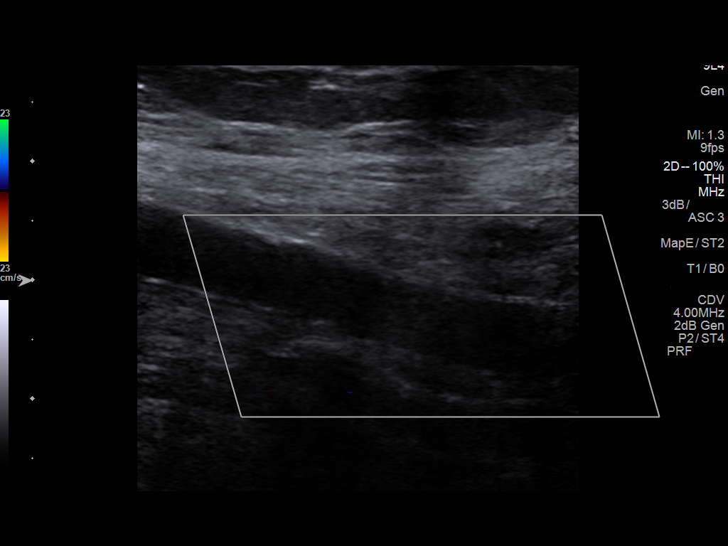
[im 17/18]
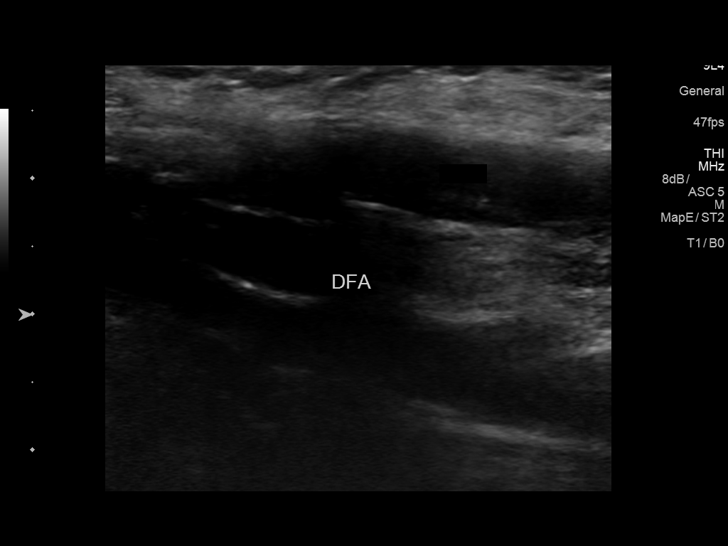

[Series 2001: us extrem low duplex arterial*left* limited · 0.05mm/px · 4 of 7 slices shown (2 of 2)]
[im 1/7]
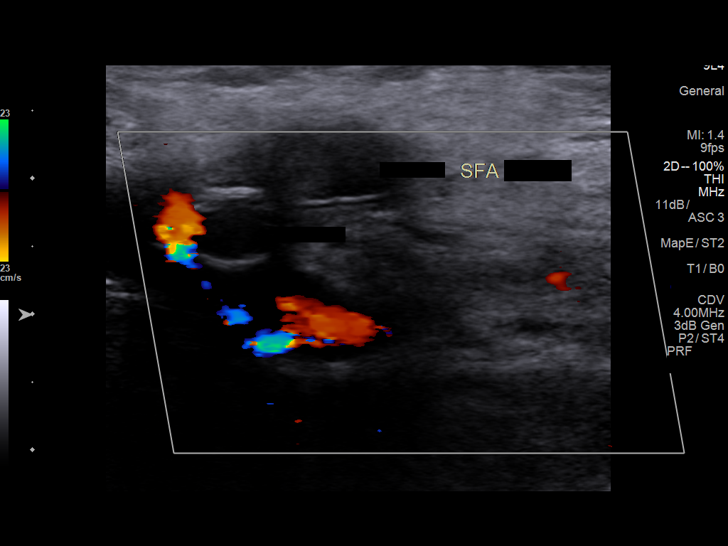
[im 3/7]
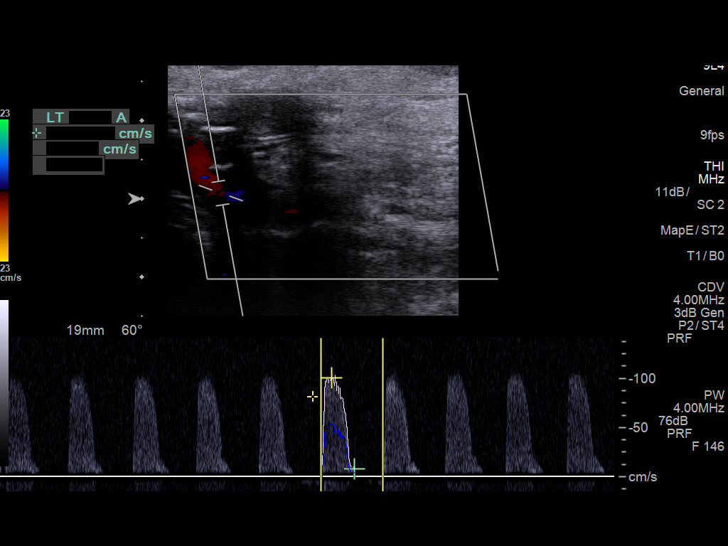
[im 5/7]
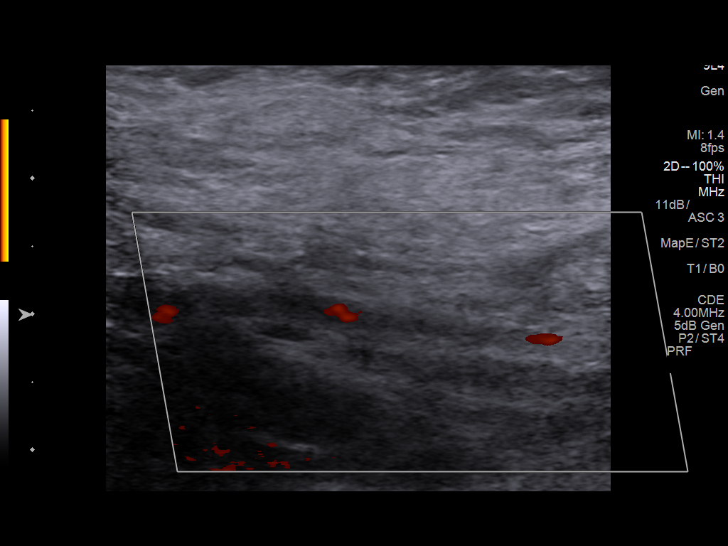
[im 7/7]
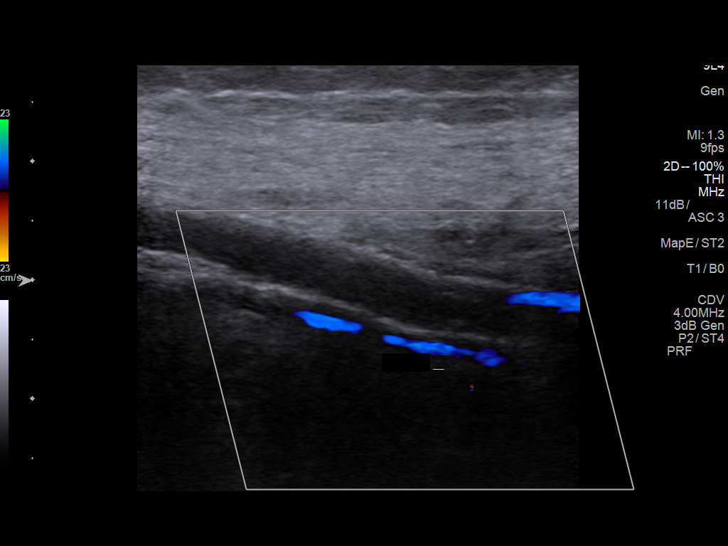

[13 of 25 positions shown; findings below may reference images not displayed]

FINDINGS: Directed duplex of the left lower extremity demonstrates patent left
common femoral artery with monophasic waveform.

The proximal bypass graft is occluded, and was occluded on the prior
CT.

Superficial femoral artery is occluded proximally, which was also
occluded on the prior CT 11/04/2014. No flow through the SFA.

Deep femoral artery is patent at the origin with monophasic flow.
IMPRESSION: Directed duplex demonstrates occlusion of the superficial femoral
artery and fem-pop bypass graft. Note that both of these were
occluded on comparison CT 11/04/2014.

Deep femoral artery remains patent with monophasic waveform.

Common femoral artery patent with monophasic waveform, indicating
significant iliac/ aortoiliac disease.

## 2018-03-27 ENCOUNTER — Emergency Department
Admission: EM | Admit: 2018-03-27 | Discharge: 2018-03-27 | Disposition: A | Discharge status: Home / Self Care | Payer: Medicare Other | Attending: Emergency Medicine | Admitting: Emergency Medicine

## 2018-03-27 ENCOUNTER — Encounter: Payer: Self-pay | Admitting: Medical Oncology

## 2018-03-27 ENCOUNTER — Emergency Department: Payer: Medicare Other

## 2018-03-27 ENCOUNTER — Ambulatory Visit: Payer: Self-pay

## 2018-03-27 DIAGNOSIS — H93A9 Pulsatile tinnitus, unspecified ear: Secondary | ICD-10-CM | POA: Diagnosis not present

## 2018-03-27 DIAGNOSIS — Z79899 Other long term (current) drug therapy: Secondary | ICD-10-CM | POA: Diagnosis not present

## 2018-03-27 DIAGNOSIS — E119 Type 2 diabetes mellitus without complications: Secondary | ICD-10-CM | POA: Diagnosis not present

## 2018-03-27 DIAGNOSIS — I129 Hypertensive chronic kidney disease with stage 1 through stage 4 chronic kidney disease, or unspecified chronic kidney disease: Secondary | ICD-10-CM | POA: Diagnosis not present

## 2018-03-27 DIAGNOSIS — N183 Chronic kidney disease, stage 3 (moderate): Secondary | ICD-10-CM | POA: Insufficient documentation

## 2018-03-27 DIAGNOSIS — I2510 Atherosclerotic heart disease of native coronary artery without angina pectoris: Secondary | ICD-10-CM | POA: Diagnosis not present

## 2018-03-27 DIAGNOSIS — Z7982 Long term (current) use of aspirin: Secondary | ICD-10-CM | POA: Insufficient documentation

## 2018-03-27 DIAGNOSIS — Z853 Personal history of malignant neoplasm of breast: Secondary | ICD-10-CM | POA: Insufficient documentation

## 2018-03-27 DIAGNOSIS — Z951 Presence of aortocoronary bypass graft: Secondary | ICD-10-CM | POA: Diagnosis not present

## 2018-03-27 DIAGNOSIS — R55 Syncope and collapse: Secondary | ICD-10-CM | POA: Diagnosis present

## 2018-03-27 DIAGNOSIS — Z794 Long term (current) use of insulin: Secondary | ICD-10-CM | POA: Diagnosis not present

## 2018-03-27 LAB — BASIC METABOLIC PANEL
Anion gap: 7 (ref 5–15)
BUN: 23 mg/dL (ref 8–23)
CO2: 23 mmol/L (ref 22–32)
Calcium: 8.9 mg/dL (ref 8.9–10.3)
Chloride: 105 mmol/L (ref 98–111)
Creatinine, Ser: 1.18 mg/dL — ABNORMAL HIGH (ref 0.44–1.00)
GFR calc Af Amer: 46 mL/min — ABNORMAL LOW (ref >60)
GFR, EST NON AFRICAN AMERICAN: 40 mL/min — AB (ref >60)
GLUCOSE: 317 mg/dL — AB (ref 70–99)
POTASSIUM: 3.8 mmol/L (ref 3.5–5.1)
Sodium: 135 mmol/L (ref 135–145)

## 2018-03-27 LAB — CBC WITH DIFFERENTIAL/PLATELET
ABS IMMATURE GRANULOCYTES: 0.02 10*3/uL (ref 0.00–0.07)
BASOS PCT: 1 %
Basophils Absolute: 0 10*3/uL (ref 0.0–0.1)
Eosinophils Absolute: 0.1 10*3/uL (ref 0.0–0.5)
Eosinophils Relative: 2 %
HCT: 30.7 % — ABNORMAL LOW (ref 36.0–46.0)
Hemoglobin: 10 g/dL — ABNORMAL LOW (ref 12.0–15.0)
Immature Granulocytes: 0 %
Lymphocytes Relative: 21 %
Lymphs Abs: 1.1 10*3/uL (ref 0.7–4.0)
MCH: 29.8 pg (ref 26.0–34.0)
MCHC: 32.6 g/dL (ref 30.0–36.0)
MCV: 91.4 fL (ref 80.0–100.0)
MONO ABS: 0.6 10*3/uL (ref 0.1–1.0)
MONOS PCT: 11 %
NEUTROS ABS: 3.6 10*3/uL (ref 1.7–7.7)
Neutrophils Relative %: 65 %
PLATELETS: 278 10*3/uL (ref 150–400)
RBC: 3.36 MIL/uL — ABNORMAL LOW (ref 3.87–5.11)
RDW: 14.1 % (ref 11.5–15.5)
WBC: 5.5 10*3/uL (ref 4.0–10.5)
nRBC: 0 % (ref 0.0–0.2)

## 2018-03-27 LAB — TROPONIN I: Troponin I: <0.03 ng/mL (ref <0.03)

## 2018-03-27 MED ORDER — MECLIZINE HCL 25 MG PO TABS
25.0000 mg | ORAL_TABLET | Freq: Once | ORAL | Status: AC
  Administered 2018-03-27: 25 mg via ORAL
  Filled 2018-03-27: qty 1

## 2018-03-27 MED ORDER — IOHEXOL 350 MG/ML SOLN
75.0000 mL | Freq: Once | INTRAVENOUS | Status: AC | PRN
  Administered 2018-03-27: 60 mL via INTRAVENOUS

## 2018-03-27 NOTE — Telephone Encounter (Signed)
Pt. Reports she has been hearing her heart beat in her head for "several months." Denies headache. States "it's a throbbing sound. Worse at night." States she is concerned about her BP. Does not check it at home.Appointment made with her provider. Instructed if symptoms worsen to go to ED.  Reason for Disposition . [1] Palpitations AND [2] no improvement after using CARE ADVICE  Answer Assessment - Initial Assessment Questions 1. DESCRIPTION: "Please describe your heart rate or heart beat that you are having" (e.g., fast/slow, regular/irregular, skipped or extra beats, "palpitations")     I hear my heart beat in my head 2. ONSET: "When did it start?" (Minutes, hours or days)      Several months 3. DURATION: "How long does it last" (e.g., seconds, minutes, hours)     Lasts for awhile 4. PATTERN "Does it come and go, or has it been constant since it started?"  "Does it get worse with exertion?"   "Are you feeling it now?"     Comes and goes 5. TAP: "Using your hand, can you tap out what you are feeling on a chair or table in front of you, so that I can hear?" (Note: not all patients can do this)       No 6. HEART RATE: "Can you tell me your heart rate?" "How many beats in 15 seconds?"  (Note: not all patients can do this)       No 7. RECURRENT SYMPTOM: "Have you ever had this before?" If so, ask: "When was the last time?" and "What happened that time?"      No 8. CAUSE: "What do you think is causing the palpitations?"     Unsure 9. CARDIAC HISTORY: "Do you have any history of heart disease?" (e.g., heart attack, angina, bypass surgery, angioplasty, arrhythmia)      HTN 10. OTHER SYMPTOMS: "Do you have any other symptoms?" (e.g., dizziness, chest pain, sweating, difficulty breathing)       Gas pains 11. PREGNANCY: "Is there any chance you are pregnant?" "When was your last menstrual period?"       No  Protocols used: HEART RATE AND HEARTBEAT QUESTIONS-A-AH

## 2018-03-27 NOTE — Telephone Encounter (Signed)
Noted. Agree with plan.

## 2018-03-27 NOTE — Telephone Encounter (Signed)
Called and spoke with  Patient and inquired if she  had checked blood pressure  Or if she had way to check pulse she states that she doesn't have blood pressure cuff or way to check heart rate . I informed patient that per Joycelyn Schmid ,NP that she needed to go to urgent care or ED to get evaluated today based on triage notes and history of HTN.  Patient verbalized understanding , she will go to ED or urgent care to get evaluated.

## 2018-03-27 NOTE — ED Provider Notes (Signed)
[facility] Emergency Department Provider Note ____________________________________________   First MD Initiated Contact with Patient 03/27/18 1533     (approximate)  I have reviewed the triage vital signs and the nursing notes.   HISTORY  Chief Complaint "hearing thumping sounds"    HPI Tiffany Bailey is a 84 y.o. female with PMH as noted below who presents essentially with pulsatile tinnitus, lasting for approximately the last month but worsening recently, and occurring bilaterally.  She reports that is constant but somewhat worse at night.  She also reports feeling slightly lightheaded when she first gets up out of bed.  She denies other dizziness or lightheadedness, denies vertigo, vision changes, or severe headache.  She denies any pain associated with the throbbing.  She has no changes in her hearing or vision.  Past Medical History:  Diagnosis Date  . Anemia   . Anginal pain ([place])   . Arthritis   . Cancer (HCC)    BREAST  . CKD (chronic kidney disease), stage III ([place])   . Coronary artery disease    S/P CABG and multiple PCI's  . Diabetes mellitus without complication ([place])   . Edema   . Failure to thrive in adult   . GERD (gastroesophageal reflux disease)   . History of breast cancer   . HOH (hard of hearing)   . Hyperlipidemia   . Hypertension   . Myocardial infarction ([place])   . Palpitations     Patient Active Problem List   Diagnosis Date Noted  . Carotid stenosis 12/19/2017  . Hand cramp 10/17/2017  . Acute non-recurrent maxillary sinusitis 10/17/2017  . Skin lesion 09/29/2017  . Diarrhea 04/23/2017  . Weight loss 09/12/2016  . CKD stage 3 due to type 2 diabetes mellitus ([place]) 08/14/2016  . PAD (peripheral artery disease) ([place]) 08/14/2016  . DM (diabetes mellitus) ([place]) 03/25/2016  . Anemia 03/25/2016  . Chronic tension-type headache, not intractable 03/25/2016  . Essential hypertension 03/25/2016  . CAD (coronary artery  disease), native coronary artery 03/25/2016  . GERD (gastroesophageal reflux disease) 03/25/2016  . Hyperlipidemia 03/25/2016  . History of breast cancer 03/25/2016    Past Surgical History:  Procedure Laterality Date  . ABDOMINAL HYSTERECTOMY    . ABOVE KNEE LEG AMPUTATION Left 2013  . ANTERIOR VITRECTOMY Left 11/16/2015   Procedure: ANTERIOR VITRECTOMY;  Surgeon: Eulogio Bear, MD;  Location: [facility] ORS;  Service: Ophthalmology;  Laterality: Left;  . BLADDER SURGERY    . CATARACT EXTRACTION W/PHACO Left 11/16/2015   Procedure: CATARACT EXTRACTION PHACO AND INTRAOCULAR LENS PLACEMENT (IOC);  Surgeon: Eulogio Bear, MD;  Location: [facility] ORS;  Service: Ophthalmology;  Laterality: Left;  Lot # [id number] H [place]; 01:22.8 AP%:12.6 CDE: 10.46  . CATARACT EXTRACTION W/PHACO Right 08/22/2016   Procedure: CATARACT EXTRACTION PHACO AND INTRAOCULAR LENS PLACEMENT (IOC);  Surgeon: Eulogio Bear, MD;  Location: [facility] ORS;  Service: Ophthalmology;  Laterality: Right;  Lot # [id number] H [place]: 00:52.1 AP%:8.7 CDE: 4.99   . CORONARY ANGIOPLASTY     STENT  . CORONARY ARTERY BYPASS GRAFT    . MASTECTOMY    . TUMOR REMOVAL     ABDOMINAL    Prior to Admission medications   Medication Sig Start Date End Date Taking? Authorizing Provider  acetaminophen (TYLENOL) 500 MG tablet Take 1 tablet (500 mg total) by mouth every 6 (six) hours as needed. 01/15/17   Thersa Salt G, DO  amLODipine (NORVASC) 10 MG tablet TAKE 1 TABLET BY MOUTH  DAILY 12/10/17   [name], FNP  aspirin EC 81 MG tablet Take 1 tablet (81 mg total) by mouth daily. 01/15/17   [name], DO  atorvastatin (LIPITOR) 40 MG tablet TAKE 1 TABLET BY MOUTH  DAILY 12/10/17   [name], FNP  BD PEN NEEDLE NANO U/F 32G X 4 MM MISC USE 1 DAILY 07/21/17   Leone Haven, MD  blood glucose meter kit and supplies KIT Dispense based on patient and insurance preference. Use up to four times daily as directed. (FOR ICD-9 250.00, 250.01). 01/13/17    [name], DO  clopidogrel (PLAVIX) 75 MG tablet TAKE 1 TABLET BY MOUTH  DAILY 09/16/17   [name], FNP  ezetimibe (ZETIA) 10 MG tablet Take 1 tablet (10 mg total) by mouth daily. 02/09/18   [name], FNP  fluticasone (FLONASE) 50 MCG/ACT nasal spray Place 1 spray into both nostrils daily. 12/10/17   [name], FNP  glucose blood (ONE TOUCH TEST STRIPS) test strip Use to test blood sugar twice daily 09/17/17   [name], FNP  Insulin Glargine (LANTUS SOLOSTAR) 100 UNIT/ML Solostar Pen Inject 4 Units into the skin daily. 11/20/17   Debbrah Alar, NP  metoprolol tartrate (LOPRESSOR) 25 MG tablet Take 1 tablet (25 mg total) by mouth 2 (two) times daily. 01/16/18   [name], FNP  nitroGLYCERIN (NITROSTAT) 0.4 MG SL tablet Place 1 tablet (0.4 mg total) under the tongue every 5 (five) minutes as needed for chest pain. 12/19/17   Minna Merritts, MD  omega-3 acid ethyl esters (LOVAZA) 1 g capsule Take by mouth daily.    [provider]  ONE TOUCH LANCETS MISC Use as directed 2 times per day 09/17/17   [name], FNP  pantoprazole (PROTONIX) 40 MG tablet Take 1 tablet (40 mg total) by mouth daily. 02/09/18   [name], FNP    Allergies Amoxicillin; Gabapentin; Mirtazapine; and Other  Family History  Problem Relation Age of Onset  . Sudden death Mother   . Arthritis Father   . Stroke Father   . Breast cancer Sister   . Diabetes Grandchild     Social History Social History   Tobacco Use  . Smoking status: Never Smoker  . Smokeless tobacco: Never Used  Substance Use Topics  . Alcohol use: No  . Drug use: No    Review of Systems  Constitutional: No fever. Eyes: No visual changes. ENT: No sore throat.  No ear pain or change in hearing.  Positive for pulsatile tinnitus. Cardiovascular: Denies chest pain. Respiratory: Denies shortness of breath. Gastrointestinal: No nausea or vomiting.  Genitourinary: Negative for  dysuria.  Musculoskeletal: Negative for back pain. Skin: Negative for rash. Neurological: Negative for headache.   ____________________________________________   PHYSICAL EXAM:  VITAL SIGNS: ED Triage Vitals [03/27/18 1500]  Enc Vitals Group     BP (!) 150/40     Pulse Rate (!) 57     Resp 18     Temp 98.3 F (36.8 C)     Temp Source Oral     SpO2 99 %     Weight 82 lb (37.2 kg)     Height 5' (1.524 m)     Head Circumference      Peak Flow      Pain Score 0     Pain Loc      Pain Edu?      Excl. in  GC?     Constitutional: Alert and oriented. Well appearing for age and in no acute distress. Eyes: Conjunctivae are normal.  EOMI.  PERRLA. Head: Atraumatic.  TMs normal bilaterally. Nose: No congestion/rhinnorhea. Mouth/Throat: Mucous membranes are moist.   Neck: Normal range of motion.  Cardiovascular: Normal rate, regular rhythm. Grossly normal heart sounds.  Good peripheral circulation. Respiratory: Normal respiratory effort.  No retractions. Lungs CTAB. Gastrointestinal: Soft and nontender. No distention.  Genitourinary: No flank tenderness. Musculoskeletal:  Extremities warm and well perfused.  Neurologic:  Normal speech and language.  Cranial nerves III through XII intact.  Normal coordination on finger-to-nose with no ataxia.  Motor and sensory intact in all extremities.  Skin:  Skin is warm and dry. No rash noted. Psychiatric: Mood and affect are normal. Speech and behavior are normal.  ____________________________________________   LABS (all labs ordered are listed, but only abnormal results are displayed)  Labs Reviewed  BASIC METABOLIC PANEL - Abnormal; Notable for the following components:      Result Value   Glucose, Bld 317 (*)    Creatinine, Ser 1.18 (*)    GFR calc non Af Amer 40 (*)    GFR calc Af Amer 46 (*)    All other components within normal limits  CBC WITH DIFFERENTIAL/PLATELET - Abnormal; Notable for the following components:   RBC 3.36  (*)    Hemoglobin 10.0 (*)    HCT 30.7 (*)    All other components within normal limits  TROPONIN I   ____________________________________________  EKG   ____________________________________________  RADIOLOGY  CT angio head/neck: Chronic atherosclerotic findings with no acute occlusion ____________________________________________   PROCEDURES  Procedure(s) performed: No  Procedures  Critical Care performed: No ____________________________________________   INITIAL IMPRESSION / ASSESSMENT AND PLAN / ED COURSE  Pertinent labs & imaging results that were available during my care of the patient were reviewed by me and considered in my medical decision making (see chart for details).  84 year old female with PMH as noted above presents with essentially a complaint of pulsatile tinnitus bilaterally over the last month.  She has no associated headache, vertigo, hearing loss, vision changes, or other concerning symptoms.  On exam, her vital signs are normal except for slight hypertension.  The remainder of her exam including neuro exam is completely normal.  Overall this is most consistent with congestion or other problem in the middle ear although the only thing going against this is that the symptoms appear to be bilateral.  Given the patient's age and comorbidities, I would also be concerned for carotid stenosis or other vascular etiology.  I do not suspect temporal arteritis given the bilateral symptoms and the lack of any vision disturbance, headache or other pain associated with this.  I will obtain a CT angio of the head and neck, EKG, basic labs, and reassess.   ----------------------------------------- 7:17 PM on 03/27/2018 -----------------------------------------  CT shows chronic atherosclerotic findings in multiple vessels, but no acute occlusions.  The lab work-up is unremarkable except for anemia which is at the patient's baseline.  The patient states that the  tinnitus has improved.  She feels well to go home.  I counseled her on the results of the work-up.  I recommend that she follows up with her primary care doctor, as well as with an ENT.  She agrees with the plan.  Return precautions given, and she expresses understanding. ____________________________________________   FINAL CLINICAL IMPRESSION(S) / ED DIAGNOSES  Final diagnoses:  Pulsatile tinnitus  NEW MEDICATIONS STARTED DURING THIS VISIT:  New Prescriptions   No medications on file     Note:  This document was prepared using Dragon voice recognition software and may include unintentional dictation errors.    Arta Silence, MD 03/27/18 1918

## 2018-03-27 NOTE — ED Notes (Signed)
Patient transported to CT 

## 2018-03-27 NOTE — ED Triage Notes (Signed)
Pt reports she was told to come to the ED by her PCP for "hearing thumping sounds" for over a month. Pt reports she can feel her head throbbing but denies pain.

## 2018-03-27 NOTE — Discharge Instructions (Addendum)
Continue to take your regular medications as prescribed.  Make an appointment to follow-up with your primary care doctor, as well as with an ENT (a referral has been provided).  Return to the ER for new, worsening, persistent throbbing, severe high blood pressure, severe headache, dizziness, or any other new or worsening symptoms that concern you.

## 2018-03-30 ENCOUNTER — Other Ambulatory Visit: Payer: Self-pay

## 2018-03-30 ENCOUNTER — Encounter: Payer: Self-pay | Admitting: Emergency Medicine

## 2018-03-30 ENCOUNTER — Emergency Department: Payer: Medicare Other

## 2018-03-30 ENCOUNTER — Ambulatory Visit: Payer: Medicare Other | Admitting: Family

## 2018-03-30 ENCOUNTER — Emergency Department
Admission: EM | Admit: 2018-03-30 | Discharge: 2018-03-30 | Disposition: A | Discharge status: Home / Self Care | Payer: Medicare Other | Attending: Emergency Medicine | Admitting: Emergency Medicine

## 2018-03-30 DIAGNOSIS — S3993XA Unspecified injury of pelvis, initial encounter: Secondary | ICD-10-CM | POA: Diagnosis present

## 2018-03-30 DIAGNOSIS — Z955 Presence of coronary angioplasty implant and graft: Secondary | ICD-10-CM | POA: Diagnosis not present

## 2018-03-30 DIAGNOSIS — Y929 Unspecified place or not applicable: Secondary | ICD-10-CM | POA: Insufficient documentation

## 2018-03-30 DIAGNOSIS — I129 Hypertensive chronic kidney disease with stage 1 through stage 4 chronic kidney disease, or unspecified chronic kidney disease: Secondary | ICD-10-CM | POA: Insufficient documentation

## 2018-03-30 DIAGNOSIS — W19XXXA Unspecified fall, initial encounter: Secondary | ICD-10-CM | POA: Insufficient documentation

## 2018-03-30 DIAGNOSIS — Y939 Activity, unspecified: Secondary | ICD-10-CM | POA: Insufficient documentation

## 2018-03-30 DIAGNOSIS — I2510 Atherosclerotic heart disease of native coronary artery without angina pectoris: Secondary | ICD-10-CM | POA: Diagnosis not present

## 2018-03-30 DIAGNOSIS — Y999 Unspecified external cause status: Secondary | ICD-10-CM | POA: Insufficient documentation

## 2018-03-30 DIAGNOSIS — Z7982 Long term (current) use of aspirin: Secondary | ICD-10-CM | POA: Insufficient documentation

## 2018-03-30 DIAGNOSIS — E1122 Type 2 diabetes mellitus with diabetic chronic kidney disease: Secondary | ICD-10-CM | POA: Diagnosis not present

## 2018-03-30 DIAGNOSIS — S32592A Other specified fracture of left pubis, initial encounter for closed fracture: Secondary | ICD-10-CM | POA: Diagnosis not present

## 2018-03-30 DIAGNOSIS — Z794 Long term (current) use of insulin: Secondary | ICD-10-CM | POA: Diagnosis not present

## 2018-03-30 DIAGNOSIS — Z951 Presence of aortocoronary bypass graft: Secondary | ICD-10-CM | POA: Insufficient documentation

## 2018-03-30 DIAGNOSIS — Z7902 Long term (current) use of antithrombotics/antiplatelets: Secondary | ICD-10-CM | POA: Diagnosis not present

## 2018-03-30 DIAGNOSIS — N183 Chronic kidney disease, stage 3 (moderate): Secondary | ICD-10-CM | POA: Insufficient documentation

## 2018-03-30 MED ORDER — TRAMADOL HCL 50 MG PO TABS: 50.0000 mg | ORAL_TABLET | Freq: Three times a day (TID) | ORAL | 0 refills | Status: DC | PRN

## 2018-03-30 MED ORDER — IBUPROFEN 400 MG PO TABS
200.0000 mg | ORAL_TABLET | Freq: Once | ORAL | Status: DC
  Filled 2018-03-30: qty 1

## 2018-03-30 MED ORDER — ACETAMINOPHEN 325 MG PO TABS
650.0000 mg | ORAL_TABLET | Freq: Once | ORAL | Status: DC
  Filled 2018-03-30: qty 2

## 2018-03-30 NOTE — ED Triage Notes (Signed)
Pt reports that she is having Phantom left hip pain. She also reports that she went to go to the bathroom and her grandson did not put the seat on it correctly and she fell on her left hip. 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 She is also complaining of back pain

## 2018-03-30 NOTE — Discharge Instructions (Addendum)
Return to the emergency room immediately for any worsening or uncontrolled pain.  Take over-the-counter Tylenol and/or ibuprofen, use as directed on label for mild or moderate pain.

## 2018-03-30 NOTE — ED Triage Notes (Signed)
Correction: Pt fell yesterday

## 2018-03-30 NOTE — ED Provider Notes (Signed)
[facility] Emergency Department Provider Note ____________________________________________   I have reviewed the triage vital signs and the triage nursing note.  HISTORY  Chief Complaint Fall and Hip Pain   Historian Patient  HPI Tiffany Bailey is a 84 y.o. female who lives at home with her children, had a fall about 24 hours ago.  She has a left AKA and landed on that side.  Since then she has had phantom limb pains on that side and also pain into the left hip.  No head injury.  No neck injury.  No other extremity injuries.  No recent illnesses.  Pain is moderate.  Is worse with movement.     Past Medical History:  Diagnosis Date  . Anemia   . Anginal pain ([place])   . Arthritis   . Cancer (HCC)    BREAST  . CKD (chronic kidney disease), stage III ([place])   . Coronary artery disease    S/P CABG and multiple PCI's  . Diabetes mellitus without complication ([place])   . Edema   . Failure to thrive in adult   . GERD (gastroesophageal reflux disease)   . History of breast cancer   . HOH (hard of hearing)   . Hyperlipidemia   . Hypertension   . Myocardial infarction ([place])   . Palpitations     Patient Active Problem List   Diagnosis Date Noted  . Carotid stenosis 12/19/2017  . Hand cramp 10/17/2017  . Acute non-recurrent maxillary sinusitis 10/17/2017  . Skin lesion 09/29/2017  . Diarrhea 04/23/2017  . Weight loss 09/12/2016  . CKD stage 3 due to type 2 diabetes mellitus ([place]) 08/14/2016  . PAD (peripheral artery disease) ([place]) 08/14/2016  . DM (diabetes mellitus) ([place]) 03/25/2016  . Anemia 03/25/2016  . Chronic tension-type headache, not intractable 03/25/2016  . Essential hypertension 03/25/2016  . CAD (coronary artery disease), native coronary artery 03/25/2016  . GERD (gastroesophageal reflux disease) 03/25/2016  . Hyperlipidemia 03/25/2016  . History of breast cancer 03/25/2016    Past Surgical History:  Procedure Laterality Date  .  ABDOMINAL HYSTERECTOMY    . ABOVE KNEE LEG AMPUTATION Left 2013  . ANTERIOR VITRECTOMY Left 11/16/2015   Procedure: ANTERIOR VITRECTOMY;  Surgeon: Eulogio Bear, MD;  Location: [facility] ORS;  Service: Ophthalmology;  Laterality: Left;  . BLADDER SURGERY    . CATARACT EXTRACTION W/PHACO Left 11/16/2015   Procedure: CATARACT EXTRACTION PHACO AND INTRAOCULAR LENS PLACEMENT (IOC);  Surgeon: Eulogio Bear, MD;  Location: [facility] ORS;  Service: Ophthalmology;  Laterality: Left;  Lot # [id number] H [place]; 01:22.8 AP%:12.6 CDE: 10.46  . CATARACT EXTRACTION W/PHACO Right 08/22/2016   Procedure: CATARACT EXTRACTION PHACO AND INTRAOCULAR LENS PLACEMENT (IOC);  Surgeon: Eulogio Bear, MD;  Location: [facility] ORS;  Service: Ophthalmology;  Laterality: Right;  Lot # [id number] H [place]: 00:52.1 AP%:8.7 CDE: 4.99   . CORONARY ANGIOPLASTY     STENT  . CORONARY ARTERY BYPASS GRAFT    . MASTECTOMY    . TUMOR REMOVAL     ABDOMINAL    Prior to Admission medications   Medication Sig Start Date End Date Taking? Authorizing Provider  acetaminophen (TYLENOL) 500 MG tablet Take 1 tablet (500 mg total) by mouth every 6 (six) hours as needed. 01/15/17   [name], DO  amLODipine (NORVASC) 10 MG tablet TAKE 1 TABLET BY MOUTH  DAILY 12/10/17   [name], FNP  aspirin EC 81 MG tablet Take 1 tablet (81 mg total) by  mouth daily. 01/15/17   [name], DO  atorvastatin (LIPITOR) 40 MG tablet TAKE 1 TABLET BY MOUTH  DAILY 12/10/17   [name], FNP  BD PEN NEEDLE NANO U/F 32G X 4 MM MISC USE 1 DAILY 07/21/17   Leone Haven, MD  blood glucose meter kit and supplies KIT Dispense based on patient and insurance preference. Use up to four times daily as directed. (FOR ICD-9 250.00, 250.01). 01/13/17   [name], DO  clopidogrel (PLAVIX) 75 MG tablet TAKE 1 TABLET BY MOUTH  DAILY 09/16/17   [name], FNP  ezetimibe (ZETIA) 10 MG tablet Take 1 tablet (10 mg total) by mouth daily. 02/09/18   [name],  FNP  fluticasone (FLONASE) 50 MCG/ACT nasal spray Place 1 spray into both nostrils daily. 12/10/17   [name], FNP  glucose blood (ONE TOUCH TEST STRIPS) test strip Use to test blood sugar twice daily 09/17/17   [name], FNP  Insulin Glargine (LANTUS SOLOSTAR) 100 UNIT/ML Solostar Pen Inject 4 Units into the skin daily. 11/20/17   Debbrah Alar, NP  metoprolol tartrate (LOPRESSOR) 25 MG tablet Take 1 tablet (25 mg total) by mouth 2 (two) times daily. 01/16/18   [name], FNP  nitroGLYCERIN (NITROSTAT) 0.4 MG SL tablet Place 1 tablet (0.4 mg total) under the tongue every 5 (five) minutes as needed for chest pain. 12/19/17   Minna Merritts, MD  omega-3 acid ethyl esters (LOVAZA) 1 g capsule Take by mouth daily.    [provider]  ONE TOUCH LANCETS MISC Use as directed 2 times per day 09/17/17   [name], FNP  pantoprazole (PROTONIX) 40 MG tablet Take 1 tablet (40 mg total) by mouth daily. 02/09/18   [name], FNP  traMADol (ULTRAM) 50 MG tablet Take 1 tablet (50 mg total) by mouth 3 (three) times daily as needed for up to 10 doses for severe pain. 03/30/18   Lisa Roca, MD    Allergies  Allergen Reactions  . Amoxicillin Hives and Nausea Only  . Gabapentin Other (See Comments)    AMS Confusion  . Mirtazapine Itching    Dry mouth with ODT Denies SOB with medication  . Other     UNKNOWN PAIN MEDICATION - unknown reaction    Family History  Problem Relation Age of Onset  . Sudden death Mother   . Arthritis Father   . Stroke Father   . Breast cancer Sister   . Diabetes Grandchild     Social History Social History   Tobacco Use  . Smoking status: Never Smoker  . Smokeless tobacco: Never Used  Substance Use Topics  . Alcohol use: No  . Drug use: No    Review of Systems  Constitutional: Negative for fever. Eyes: Negative for visual changes. ENT: Negative for sore throat. Cardiovascular: Negative for chest  pain. Respiratory: Negative for shortness of breath. Gastrointestinal: Negative for abdominal pain, vomiting and diarrhea. Genitourinary: Negative for dysuria. Musculoskeletal: Negative for back pain. Skin: Negative for rash. Neurological: Negative for headache.  ____________________________________________   PHYSICAL EXAM:  VITAL SIGNS: ED Triage Vitals [03/30/18 1117]  Enc Vitals Group     BP (!) 190/66     Pulse Rate 88     Resp 18     Temp (!) 97.5 F (36.4 C)     Temp Source Oral     SpO2 98 %     Weight 82 lb (  37.2 kg)     Height 5' (1.524 m)     Head Circumference      [place] Flow      Pain Score 10     Pain Loc      Pain Edu?      Excl. in [place]?      Constitutional: Alert and oriented.  HEENT      Head: Normocephalic and atraumatic.      Eyes: Conjunctivae are normal. Pupils equal and round.       Ears:         Nose: No congestion/rhinnorhea.      Mouth/Throat: Mucous membranes are moist.      Neck: No stridor.  Tender cervical spine. Cardiovascular/Chest: Normal rate, regular rhythm.  No murmurs, rubs, or gallops. Respiratory: Normal respiratory effort without tachypnea nor retractions. Breath sounds are clear and equal bilaterally. No wheezes/rales/rhonchi. Gastrointestinal: Soft. No distention, no guarding, no rebound. Nontender.    Genitourinary/rectal:Deferred Musculoskeletal: Stable pelvis.  Pain localized to deep inguinal left hip.   Other extremities nontender. Neurologic:  Normal speech and language. No gross or focal neurologic deficits are appreciated. Skin:  Skin is warm, dry and intact. No rash noted. Psychiatric: Mood and affect are normal. Speech and behavior are normal. Patient exhibits appropriate insight and judgment.   ____________________________________________  LABS (pertinent positives/negatives) I, Lisa Roca, MD the attending physician have reviewed the labs noted below.  Labs Reviewed - No data to  display  ____________________________________________    EKG I, Lisa Roca, MD, the attending physician have personally viewed and interpreted all ECGs.  None ____________________________________________  RADIOLOGY   Left hip xray - viewed by me, interp by radiologist:  FINDINGS: Nondisplaced fracture left inferior pubic ramus. Negative for hip fracture.  Left femoral artery stent. 12 mm calcification left lateral pelvis probably a benign lymph node  IMPRESSION: Nondisplaced fracture left inferior pubic ramus. Negative left hip. __________________________________________  PROCEDURES  Procedure(s) performed: None  Procedures  Critical Care performed: None   ____________________________________________  ED COURSE / ASSESSMENT AND PLAN  Pertinent labs & imaging results that were available during my care of the patient were reviewed by me and considered in my medical decision making (see chart for details).     X-ray shows nondisplaced inferior pubic rami fracture.  Discussed with patient and daughter.  Will recommend over-the-counter Tylenol and ibuprofen and I will prescribe tramadol as needed for breakthrough pain.    CONSULTATIONS: None  Patient / Family / Caregiver informed of clinical course, medical decision-making process, and agree with plan.   I discussed return precautions, follow-up instructions, and discharge instructions with patient and/or family.  Discharge Instructions : Return to the emergency room immediately for any worsening or uncontrolled pain.  Take over-the-counter Tylenol and/or ibuprofen, use as directed on label for mild or moderate pain.      ___________________________________________   FINAL CLINICAL IMPRESSION(S) / ED DIAGNOSES   Final diagnoses:  Closed fracture of left inferior pubic ramus, initial encounter ([place])      ___________________________________________         Note: This dictation was prepared  with Dragon dictation. Any transcriptional errors that result from this process are unintentional    Lisa Roca, MD 03/30/18 1635

## 2018-03-30 NOTE — ED Notes (Signed)
Pt refused tyl and ibu stating that she had these medications at home

## 2018-03-31 ENCOUNTER — Telehealth: Payer: Self-pay

## 2018-03-31 DIAGNOSIS — Z794 Long term (current) use of insulin: Secondary | ICD-10-CM

## 2018-03-31 DIAGNOSIS — E1159 Type 2 diabetes mellitus with other circulatory complications: Secondary | ICD-10-CM

## 2018-03-31 DIAGNOSIS — I10 Essential (primary) hypertension: Secondary | ICD-10-CM

## 2018-03-31 NOTE — Telephone Encounter (Signed)
Copied from [place] [phone] [id number]. Topic: General - Other >> Mar 31, 2018  3:56 PM Vernona Rieger wrote: Reason for CRM: Patient said she would like to have some home health come out to her house. She said that one agency already comes ( she is unsure ) they come 3 times a week and stays about 2 hours. She would like someone come about 2 hours everyday. Please Advise.

## 2018-04-03 NOTE — Addendum Note (Signed)
Addended by: [name] on: 04/03/2018 09:21 AM   Modules accepted: Orders

## 2018-04-03 NOTE — Telephone Encounter (Signed)
Call pt  I have placed order for home health for patient to since she has history of right below-knee amputation, DM, HTN,  I think she qualify for home health services. I cannot tell her home many 'hours per day' they will be there. My understanding is that is in an insurance piece. Please ask her to discuss this with home health once they start seeing her.      Dr. Derrel Nip, which you mind cosigning this note since patient is on Medicare?

## 2018-04-03 NOTE — Telephone Encounter (Signed)
Tried calling unable to leave voicemail

## 2018-04-08 ENCOUNTER — Telehealth: Payer: Self-pay | Admitting: Family

## 2018-04-08 NOTE — Telephone Encounter (Deleted)
Copied from [place] [phone]. Topic: Quick Communication - Home Health Verbal Orders >> Apr 08, 2018  2:53 PM Yvette Rack wrote: Caller/Agency:  RN Alyson Ingles at [place] Number: [phone] Requesting OT/PT/Skilled Nursing/Social Work: Start of care on 04-09-18 Frequency:

## 2018-04-08 NOTE — Telephone Encounter (Signed)
Noted  

## 2018-04-08 NOTE — Telephone Encounter (Signed)
Copied from [place] [phone]. Topic: Quick Communication - Home Health Verbal Orders >> Apr 08, 2018  2:53 PM Yvette Rack wrote: Caller/Agency:  RN Alyson Ingles at [place] Number: [phone] Requesting OT/PT/Skilled Nursing/Social Work: Start of care on 04-09-18 Frequency:

## 2018-04-08 NOTE — Telephone Encounter (Signed)
Verbal orders given per below

## 2018-04-09 NOTE — Telephone Encounter (Signed)
Spoke with patient she states home health came out on 04/07/18

## 2018-04-10 ENCOUNTER — Telehealth: Payer: Self-pay | Admitting: Family

## 2018-04-10 NOTE — Telephone Encounter (Signed)
Called back and gave verbal orders per Joycelyn Schmid , NP

## 2018-04-10 NOTE — Telephone Encounter (Signed)
noted 

## 2018-04-10 NOTE — Telephone Encounter (Signed)
Please give verbal okay

## 2018-04-10 NOTE — Telephone Encounter (Signed)
Copied from [place] [phone]. Topic: Quick Communication - Home Health Verbal Orders >> Apr 10, 2018 12:25 PM Conception Chancy, NT wrote: Caller/Agency: Estill Bamberg [facility] Number: [phone] Requesting OT/PT/Skilled Nursing/Social Work: Skilled Nursing Frequency: 2 week 1 , 1 week 2, also requesting Copywriter, advertising.

## 2018-04-20 ENCOUNTER — Telehealth: Payer: Self-pay | Admitting: Family

## 2018-04-20 NOTE — Telephone Encounter (Signed)
Copied from [place] [id number]. Topic: Quick Communication - Home Health Verbal Orders >> Apr 20, 2018 12:35 PM Berneta Levins wrote: Caller/Agency: Wes from [facility] Number: [phone], [place] to leave a message Requesting OT/PT/Skilled Nursing/Social Work: PT Frequency: 2x a week for 2 weeks, then 1x a week for 1 week.

## 2018-04-22 NOTE — Telephone Encounter (Signed)
Please give verbal YES for ot/pt/nursing/social work

## 2018-04-22 NOTE — Telephone Encounter (Signed)
Spoke with Wes [facility] at [facility] orders given for PT

## 2018-05-04 DIAGNOSIS — Z853 Personal history of malignant neoplasm of breast: Secondary | ICD-10-CM

## 2018-05-04 DIAGNOSIS — E1151 Type 2 diabetes mellitus with diabetic peripheral angiopathy without gangrene: Secondary | ICD-10-CM

## 2018-05-04 DIAGNOSIS — Z794 Long term (current) use of insulin: Secondary | ICD-10-CM

## 2018-05-04 DIAGNOSIS — M1990 Unspecified osteoarthritis, unspecified site: Secondary | ICD-10-CM

## 2018-05-04 DIAGNOSIS — Z951 Presence of aortocoronary bypass graft: Secondary | ICD-10-CM

## 2018-05-04 DIAGNOSIS — I2510 Atherosclerotic heart disease of native coronary artery without angina pectoris: Secondary | ICD-10-CM

## 2018-05-04 DIAGNOSIS — E1122 Type 2 diabetes mellitus with diabetic chronic kidney disease: Secondary | ICD-10-CM

## 2018-05-04 DIAGNOSIS — N183 Chronic kidney disease, stage 3 (moderate): Secondary | ICD-10-CM

## 2018-05-04 DIAGNOSIS — H9190 Unspecified hearing loss, unspecified ear: Secondary | ICD-10-CM

## 2018-05-04 DIAGNOSIS — I10 Essential (primary) hypertension: Secondary | ICD-10-CM

## 2018-05-04 DIAGNOSIS — Z89612 Acquired absence of left leg above knee: Secondary | ICD-10-CM

## 2018-05-04 DIAGNOSIS — K219 Gastro-esophageal reflux disease without esophagitis: Secondary | ICD-10-CM

## 2018-05-04 DIAGNOSIS — I252 Old myocardial infarction: Secondary | ICD-10-CM

## 2018-05-04 DIAGNOSIS — S32502D Unspecified fracture of left pubis, subsequent encounter for fracture with routine healing: Secondary | ICD-10-CM

## 2018-05-04 DIAGNOSIS — R296 Repeated falls: Secondary | ICD-10-CM

## 2018-05-04 DIAGNOSIS — G44229 Chronic tension-type headache, not intractable: Secondary | ICD-10-CM

## 2018-05-04 DIAGNOSIS — E785 Hyperlipidemia, unspecified: Secondary | ICD-10-CM

## 2018-05-04 DIAGNOSIS — Z9181 History of falling: Secondary | ICD-10-CM

## 2018-05-04 DIAGNOSIS — Z7982 Long term (current) use of aspirin: Secondary | ICD-10-CM

## 2018-05-04 DIAGNOSIS — D631 Anemia in chronic kidney disease: Secondary | ICD-10-CM

## 2018-05-25 ENCOUNTER — Ambulatory Visit (INDEPENDENT_AMBULATORY_CARE_PROVIDER_SITE_OTHER): Payer: Medicare Other | Admitting: Family Medicine

## 2018-05-25 ENCOUNTER — Encounter: Payer: Self-pay | Admitting: Family Medicine

## 2018-05-25 VITALS — BP 140/58 | HR 76 | Temp 98.6°F

## 2018-05-25 DIAGNOSIS — M7989 Other specified soft tissue disorders: Secondary | ICD-10-CM | POA: Diagnosis not present

## 2018-05-25 DIAGNOSIS — Z23 Encounter for immunization: Secondary | ICD-10-CM | POA: Diagnosis not present

## 2018-05-25 DIAGNOSIS — D649 Anemia, unspecified: Secondary | ICD-10-CM

## 2018-05-25 DIAGNOSIS — R739 Hyperglycemia, unspecified: Secondary | ICD-10-CM

## 2018-05-25 LAB — CBC WITH DIFFERENTIAL/PLATELET
BASOS PCT: 0.5 %
Basophils Absolute: 28 cells/uL (ref 0–200)
Eosinophils Absolute: 99 cells/uL (ref 15–500)
Eosinophils Relative: 1.8 %
HCT: 29.9 % — ABNORMAL LOW (ref 35.0–45.0)
HEMOGLOBIN: 9.5 g/dL — AB (ref 11.7–15.5)
Lymphs Abs: 1073 cells/uL (ref 850–3900)
MCH: 29.7 pg (ref 27.0–33.0)
MCHC: 31.8 g/dL — ABNORMAL LOW (ref 32.0–36.0)
MCV: 93.4 fL (ref 80.0–100.0)
MONOS PCT: 6.8 %
MPV: 10.5 fL (ref 7.5–12.5)
NEUTROS ABS: 3927 {cells}/uL (ref 1500–7800)
Neutrophils Relative %: 71.4 %
PLATELETS: 243 10*3/uL (ref 140–400)
RBC: 3.2 10*6/uL — AB (ref 3.80–5.10)
RDW: 12.3 % (ref 11.0–15.0)
Total Lymphocyte: 19.5 %
WBC: 5.5 10*3/uL (ref 3.8–10.8)
WBCMIX: 374 {cells}/uL (ref 200–950)

## 2018-05-25 MED ORDER — FUROSEMIDE 20 MG PO TABS: 20.0000 mg | ORAL_TABLET | Freq: Every day | ORAL | 0 refills | Status: DC

## 2018-05-25 NOTE — Progress Notes (Signed)
Subjective:    Patient ID: Tiffany Bailey, female    DOB: [birth year], 84 y.o.   MRN: [id number]  HPI  Presents to clinic c/o left arm pain and swelling of left arm for the past few days.  Patient denies any known injury to the arm.  Patient also denies any recent travel with long periods of time in plane or car.  Patient does take aspirin and Plavix for blood thinning/history of heart disease purposes.  Blood pressure has been controlled on her current medication regimen.  The swelling is in her upper arm and top of forearm, no swelling noted in her left hand.  Patient is active, she wheels herself in the wheelchair by using her arms to roll the wheels.  Denies any fever or chills.  Denies shortness of breath or wheezing.  Denies chest pain, palpitations, feeling faint or dizzy.  Patient Active Problem List   Diagnosis Date Noted  . Carotid stenosis 12/19/2017  . Hand cramp 10/17/2017  . Acute non-recurrent maxillary sinusitis 10/17/2017  . Skin lesion 09/29/2017  . Diarrhea 04/23/2017  . Weight loss 09/12/2016  . CKD stage 3 due to type 2 diabetes mellitus ([place]) 08/14/2016  . PAD (peripheral artery disease) ([place]) 08/14/2016  . DM (diabetes mellitus) ([place]) 03/25/2016  . Anemia 03/25/2016  . Chronic tension-type headache, not intractable 03/25/2016  . Essential hypertension 03/25/2016  . CAD (coronary artery disease), native coronary artery 03/25/2016  . GERD (gastroesophageal reflux disease) 03/25/2016  . Hyperlipidemia 03/25/2016  . History of breast cancer 03/25/2016   Social History   Tobacco Use  . Smoking status: Never Smoker  . Smokeless tobacco: Never Used  Substance Use Topics  . Alcohol use: No   Review of Systems  Constitutional: Negative for chills, fatigue and fever.  HENT: Negative for congestion, ear pain, sinus pain and sore throat.   Eyes: Negative.   Respiratory: Negative for cough, shortness of breath and wheezing.   Cardiovascular: Negative for chest  pain, palpitations and leg swelling.  Gastrointestinal: Negative for abdominal pain, diarrhea, nausea and vomiting.  Genitourinary: Negative for dysuria, frequency and urgency.  Musculoskeletal: +left arm pain and swelling Skin: Negative for color change, pallor and rash.  Neurological: Negative for syncope, light-headedness and headaches.  Psychiatric/Behavioral: The patient is not nervous/anxious.       Objective:   Physical Exam  Constitutional: She is oriented to person, place, and time. She appears well-nourished. No distress.  HENT:  Head: Normocephalic and atraumatic.  Eyes: Pupils are equal, round, and reactive to light. Conjunctivae and EOM are normal. No scleral icterus.  Neck: Normal range of motion. Neck supple. No JVD present. No tracheal deviation present.  Cardiovascular: Normal rate and regular rhythm.  Pulmonary/Chest: Effort normal and breath sounds normal. No respiratory distress.  Musculoskeletal:  Left AKA. Swelling of LUE from mid bicep to mid forearm, I would rate the swelling between trace and +1. No swelling of hands or fingers. ROM of UEs intact.   Neurological: She is alert and oriented to person, place, and time.  Skin: Skin is warm and dry. Capillary refill takes less than 2 seconds. No rash noted. No erythema. No pallor.  Psychiatric: She has a normal mood and affect. Her behavior is normal.  Nursing note and vitals reviewed.     Vitals:   05/25/18 1520  BP: (!) 140/58  Pulse: 76  Temp: 98.6 F (37 C)  SpO2: 98%    Assessment & Plan:   Left arm swelling-we  will get Doppler to rule out DVT and left upper extremity.  We will also get CBC and CMP in clinic today.  She will take Lasix 20 mg x 3 doses then stop to see if this helps improve swelling.  Advised to keep left arm elevated up on pillows when patient is sitting or laying.  High-dose flu vaccine given in clinic today.  Patient and daughter both aware that they will be contacted in regards to  ultrasound referral appointment being set up.  Advised that if the arm swelling gets increasingly worse to call office to let us know and or go to emergency department right away.

## 2018-05-26 ENCOUNTER — Encounter: Payer: Self-pay | Admitting: Family Medicine

## 2018-05-26 LAB — COMPREHENSIVE METABOLIC PANEL
ALK PHOS: 103 U/L (ref 39–117)
ALT: 16 U/L (ref 0–35)
AST: 19 U/L (ref 0–37)
Albumin: 3.6 g/dL (ref 3.5–5.2)
BUN: 24 mg/dL — AB (ref 6–23)
CHLORIDE: 104 meq/L (ref 96–112)
CO2: 23 mEq/L (ref 19–32)
Calcium: 8.6 mg/dL (ref 8.4–10.5)
Creatinine, Ser: 1.1 mg/dL (ref 0.40–1.20)
GFR: 60.25 mL/min (ref >60.00)
GLUCOSE: 389 mg/dL — AB (ref 70–99)
POTASSIUM: 4.2 meq/L (ref 3.5–5.1)
SODIUM: 135 meq/L (ref 135–145)
TOTAL PROTEIN: 6 g/dL (ref 6.0–8.3)
Total Bilirubin: 0.5 mg/dL (ref 0.2–1.2)

## 2018-05-28 NOTE — Addendum Note (Signed)
Addended by: Philis Nettle on: 05/28/2018 12:55 PM   Modules accepted: Orders

## 2018-05-28 NOTE — Addendum Note (Signed)
Addended by: Philis Nettle on: 05/28/2018 12:56 PM   Modules accepted: Orders

## 2018-06-02 ENCOUNTER — Ambulatory Visit
Admission: RE | Admit: 2018-06-02 | Discharge: 2018-06-02 | Disposition: A | Discharge status: Home / Self Care | Payer: Medicare Other | Source: Ambulatory Visit | Attending: Family Medicine | Admitting: Family Medicine

## 2018-06-02 DIAGNOSIS — M7989 Other specified soft tissue disorders: Secondary | ICD-10-CM | POA: Insufficient documentation

## 2018-06-04 ENCOUNTER — Telehealth: Payer: Self-pay | Admitting: Family

## 2018-06-04 NOTE — Telephone Encounter (Signed)
Pt called of U/S results . Pt verbalized understanding.

## 2018-06-05 ENCOUNTER — Encounter: Payer: Self-pay | Admitting: Family Medicine

## 2018-06-05 ENCOUNTER — Ambulatory Visit (INDEPENDENT_AMBULATORY_CARE_PROVIDER_SITE_OTHER): Payer: Medicare Other | Admitting: Family Medicine

## 2018-06-05 VITALS — BP 128/60 | HR 81 | Temp 98.6°F | Ht 60.0 in

## 2018-06-05 DIAGNOSIS — J0110 Acute frontal sinusitis, unspecified: Secondary | ICD-10-CM | POA: Diagnosis not present

## 2018-06-05 DIAGNOSIS — J3489 Other specified disorders of nose and nasal sinuses: Secondary | ICD-10-CM | POA: Diagnosis not present

## 2018-06-05 DIAGNOSIS — R0981 Nasal congestion: Secondary | ICD-10-CM | POA: Diagnosis not present

## 2018-06-05 MED ORDER — DOXYCYCLINE HYCLATE 100 MG PO TABS: 100.0000 mg | ORAL_TABLET | Freq: Two times a day (BID) | ORAL | 0 refills | Status: DC

## 2018-06-05 MED ORDER — LORATADINE 10 MG PO TABS: 10.0000 mg | ORAL_TABLET | Freq: Every day | ORAL | 2 refills | Status: DC

## 2018-06-05 NOTE — Progress Notes (Signed)
Subjective:    Patient ID: Tiffany Bailey, female    DOB: 1928/02/11, 84 y.o.   MRN: [id number]  HPI  Presents to clinic c/o sinus pressure, sinus headache, sinus congestion/drainage for the past 10-11 days.  Patient has been using her Flonase every day, but symptoms still seem to have gotten worse.  Patient states she feels pressure around her eyes as well.  Denies any fever or chills.  Denies any nausea, vomiting or diarrhea.  Denies cough, chest pain, shortness of breath or wheezing.  Patient Active Problem List   Diagnosis Date Noted  . Carotid stenosis 12/19/2017  . Hand cramp 10/17/2017  . Acute non-recurrent maxillary sinusitis 10/17/2017  . Skin lesion 09/29/2017  . Diarrhea 04/23/2017  . Weight loss 09/12/2016  . CKD stage 3 due to type 2 diabetes mellitus ([place]) 08/14/2016  . PAD (peripheral artery disease) ([place]) 08/14/2016  . DM (diabetes mellitus) ([place]) 03/25/2016  . Anemia 03/25/2016  . Chronic tension-type headache, not intractable 03/25/2016  . Essential hypertension 03/25/2016  . CAD (coronary artery disease), native coronary artery 03/25/2016  . GERD (gastroesophageal reflux disease) 03/25/2016  . Hyperlipidemia 03/25/2016  . History of breast cancer 03/25/2016   Social History   Tobacco Use  . Smoking status: Never Smoker  . Smokeless tobacco: Never Used  Substance Use Topics  . Alcohol use: No   Review of Systems  Constitutional: Negative for chills, fatigue and fever.  HENT: +congestion, sinus pain, pressure around eyes, sinus drainage.  Eyes: Negative.   Respiratory: Negative for cough, shortness of breath and wheezing.   Cardiovascular: Negative for chest pain, palpitations and leg swelling.  Gastrointestinal: Negative for abdominal pain, diarrhea, nausea and vomiting.  Genitourinary: Negative for dysuria, frequency and urgency.  Musculoskeletal: Negative for arthralgias and myalgias.  Skin: Negative for color change, pallor and rash.    Neurological: Negative for syncope, light-headedness and headaches.  Psychiatric/Behavioral: The patient is not nervous/anxious.       Objective:   Physical Exam Vitals signs reviewed.  Constitutional:      Appearance: She is well-developed. She is not ill-appearing or toxic-appearing.  HENT:     Head: Normocephalic and atraumatic.     Nose: Mucosal edema, congestion and rhinorrhea present.     Right Turbinates: Swollen.     Left Turbinates: Swollen.     Right Sinus: Frontal sinus tenderness present.     Left Sinus: Frontal sinus tenderness present.     Comments: +post nasal drip    Mouth/Throat:     Mouth: Mucous membranes are moist.  Eyes:     General: No scleral icterus.    Pupils: Pupils are equal, round, and reactive to light.  Neck:     Musculoskeletal: Normal range of motion and neck supple. No neck rigidity.  Cardiovascular:     Rate and Rhythm: Normal rate and regular rhythm.     Heart sounds: Normal heart sounds.  Pulmonary:     Effort: Pulmonary effort is normal. No respiratory distress.     Breath sounds: Normal breath sounds. No wheezing, rhonchi or rales.  Lymphadenopathy:     Cervical: No cervical adenopathy.  Skin:    General: Skin is warm and dry.  Neurological:     Mental Status: She is alert and oriented to person, place, and time.     Comments: Uses wheelchair due to left AKA  Psychiatric:        Mood and Affect: Mood normal.  Behavior: Behavior normal.       Vitals:   06/05/18 1602  BP: 128/60  Pulse: 81  Temp: 98.6 F (37 C)  SpO2: 97%   Assessment & Plan:   Sinusitis, sinus congestion, sinus headache - patient will take doxycycline twice daily for 10 days and will continue to use Flonase nasal spray as prescribed.  Advised to also try a saline nasal flush to rinse out congestion & begin taking loratadine 10 mg/day to dry up congestion as well.  Advised to rest, increase fluid intake and do good handwashing.  Patient aware she can use  Tylenol as needed for headache pain.  Patient will keep regularly scheduled follow-up with PCP as planned.  Advised return to clinic sooner if any issues arise.

## 2018-06-19 ENCOUNTER — Other Ambulatory Visit (INDEPENDENT_AMBULATORY_CARE_PROVIDER_SITE_OTHER): Payer: Medicare HMO

## 2018-06-19 DIAGNOSIS — D649 Anemia, unspecified: Secondary | ICD-10-CM

## 2018-06-19 DIAGNOSIS — R739 Hyperglycemia, unspecified: Secondary | ICD-10-CM

## 2018-06-19 NOTE — Addendum Note (Signed)
Addended by: Arby Barrette on: 06/19/2018 10:06 AM   Modules accepted: Orders

## 2018-06-20 LAB — CBC WITH DIFFERENTIAL/PLATELET
Absolute Monocytes: 404 cells/uL (ref 200–950)
Basophils Absolute: 32 cells/uL (ref 0–200)
Basophils Relative: 0.8 %
Eosinophils Absolute: 92 cells/uL (ref 15–500)
Eosinophils Relative: 2.3 %
HEMATOCRIT: 30.5 % — AB (ref 35.0–45.0)
Hemoglobin: 9.9 g/dL — ABNORMAL LOW (ref 11.7–15.5)
LYMPHS ABS: 1124 {cells}/uL (ref 850–3900)
MCH: 29.6 pg (ref 27.0–33.0)
MCHC: 32.5 g/dL (ref 32.0–36.0)
MCV: 91 fL (ref 80.0–100.0)
MPV: 10.6 fL (ref 7.5–12.5)
Monocytes Relative: 10.1 %
NEUTROS PCT: 58.7 %
Neutro Abs: 2348 cells/uL (ref 1500–7800)
PLATELETS: 236 10*3/uL (ref 140–400)
RBC: 3.35 10*6/uL — AB (ref 3.80–5.10)
RDW: 12.9 % (ref 11.0–15.0)
Total Lymphocyte: 28.1 %
WBC: 4 10*3/uL (ref 3.8–10.8)

## 2018-06-20 LAB — IRON,TIBC AND FERRITIN PANEL
%SAT: 27 % (calc) (ref 16–45)
Ferritin: 104 ng/mL (ref 16–288)
Iron: 52 ug/dL (ref 45–160)
TIBC: 195 mcg/dL (calc) — ABNORMAL LOW (ref 250–450)

## 2018-06-20 LAB — BASIC METABOLIC PANEL
BUN/Creatinine Ratio: 17 (calc) (ref 6–22)
BUN: 20 mg/dL (ref 7–25)
CO2: 21 mmol/L (ref 20–32)
CREATININE: 1.18 mg/dL — AB (ref 0.60–0.88)
Calcium: 8.9 mg/dL (ref 8.6–10.4)
Chloride: 108 mmol/L (ref 98–110)
Glucose, Bld: 188 mg/dL — ABNORMAL HIGH (ref 65–99)
Potassium: 4.1 mmol/L (ref 3.5–5.3)
Sodium: 140 mmol/L (ref 135–146)

## 2018-06-20 LAB — B12 AND FOLATE PANEL
Folate: 13 ng/mL
VITAMIN B 12: 596 pg/mL (ref 200–1100)

## 2018-06-25 ENCOUNTER — Other Ambulatory Visit: Payer: Self-pay | Admitting: Family

## 2018-06-25 DIAGNOSIS — E785 Hyperlipidemia, unspecified: Secondary | ICD-10-CM

## 2018-06-29 ENCOUNTER — Ambulatory Visit (INDEPENDENT_AMBULATORY_CARE_PROVIDER_SITE_OTHER): Payer: Medicare HMO | Admitting: Family Medicine

## 2018-06-29 ENCOUNTER — Encounter: Payer: Self-pay | Admitting: Family Medicine

## 2018-06-29 VITALS — BP 148/60 | HR 75 | Temp 98.6°F | Resp 16

## 2018-06-29 DIAGNOSIS — M7989 Other specified soft tissue disorders: Secondary | ICD-10-CM | POA: Diagnosis not present

## 2018-06-29 NOTE — Progress Notes (Signed)
Subjective:    Patient ID: Tiffany Bailey, female    DOB: 1927-07-31, 85 y.o.   MRN: [id number]  HPI   Presents to clinic complaining of left arm swelling.  Patient was seen for this previously back in the beginning of December 2019.  Patient was placed on Lasix for 3 doses, and Doppler was done to rule out DVT.  Patient states that he took the Lasix that did seem to help the swelling.  And DVT was ruled out.  Lab work was also checked x2, and we did see minor improvements in her CBC and metabolic panels.  Patient was advised at last visit to try and elevate left arm when she is sitting on pillows, patient states she has not done this.  Denies any pain in her left arm or decreased range of motion.  States she just thinks it is swollen. Patient Active Problem List   Diagnosis Date Noted  . Carotid stenosis 12/19/2017  . Hand cramp 10/17/2017  . Acute non-recurrent maxillary sinusitis 10/17/2017  . Skin lesion 09/29/2017  . Diarrhea 04/23/2017  . Weight loss 09/12/2016  . CKD stage 3 due to type 2 diabetes mellitus ([place]) 08/14/2016  . PAD (peripheral artery disease) ([place]) 08/14/2016  . DM (diabetes mellitus) ([place]) 03/25/2016  . Anemia 03/25/2016  . Chronic tension-type headache, not intractable 03/25/2016  . Essential hypertension 03/25/2016  . CAD (coronary artery disease), native coronary artery 03/25/2016  . GERD (gastroesophageal reflux disease) 03/25/2016  . Hyperlipidemia 03/25/2016  . History of breast cancer 03/25/2016   Social History   Tobacco Use  . Smoking status: Never Smoker  . Smokeless tobacco: Never Used  Substance Use Topics  . Alcohol use: No   Review of Systems   Constitutional: Negative for chills, fatigue and fever.  HENT: Negative for congestion, ear pain, sinus pain and sore throat.   Eyes: Negative.   Respiratory: Negative for cough, shortness of breath and wheezing.   Cardiovascular: Negative for chest pain, palpitations and leg swelling. +left  arm swelling  Gastrointestinal: Negative for abdominal pain, diarrhea, nausea and vomiting.  Genitourinary: Negative for dysuria, frequency and urgency.  Musculoskeletal: Negative for arthralgias and myalgias.  Skin: Negative for color change, pallor and rash.  Neurological: Negative for syncope, light-headedness and headaches.  Psychiatric/Behavioral: The patient is not nervous/anxious.       Objective:   Physical Exam Vitals signs and nursing note reviewed.  Constitutional:      General: She is not in acute distress.    Appearance: Normal appearance. She is not ill-appearing or toxic-appearing.     Comments: In wheel chair  HENT:     Head: Normocephalic and atraumatic.  Eyes:     General: No scleral icterus.    Extraocular Movements: Extraocular movements intact.     Conjunctiva/sclera: Conjunctivae normal.  Cardiovascular:     Rate and Rhythm: Normal rate and regular rhythm.     Comments: +radial and ulnar pulses Pulmonary:     Effort: Pulmonary effort is normal. No respiratory distress.     Breath sounds: Normal breath sounds.  Musculoskeletal:        General: No swelling (Very very minimal swelling in left arm, trace. It is not pitting. ROM of LUE normal. ).     Comments: Left sided AKA  Skin:    General: Skin is warm and dry.     Coloration: Skin is not jaundiced or pale.     Findings: No bruising or erythema.  Neurological:  Mental Status: She is alert and oriented to person, place, and time.  Psychiatric:        Mood and Affect: Mood normal.        Behavior: Behavior normal.    Vitals:   06/29/18 1428  BP: (!) 148/60  Pulse: 75  Resp: 16  Temp: 98.6 F (37 C)  SpO2: 98%      Assessment & Plan:    A total of 15  minutes were spent face-to-face with the patient during this encounter and over half of that time was spent on counseling and coordination of care. The patient was counseled on how to help arm swelling.   Left arm swelling - when left arm  compared to right it does appear very very minorly larger.  DVT was ruled out with Doppler.  Due to the very very minimal amount of swelling, I do not feel another round of Lasix is warranted.  Explained to patient that while Lasix is a medication that can be helpful in times of swelling, it can hurt the kidneys and if we do not need to take Lasix I would prefer she not take this medication in addition to all of the other medications she takes.  Advised patient to elevate left arm above the heart on a pile of pillows when she is sitting to see if this helps swelling to improve.  Patient will keep regularly scheduled follow-up with PCP as planned.  Advised to return to clinic anytime if issues arise.

## 2018-06-29 NOTE — Patient Instructions (Signed)
Elevate left arm on pillows when sitting to see if this helps swelling

## 2018-07-01 ENCOUNTER — Encounter: Payer: Self-pay | Admitting: Family

## 2018-07-01 ENCOUNTER — Ambulatory Visit (INDEPENDENT_AMBULATORY_CARE_PROVIDER_SITE_OTHER): Payer: Medicare HMO | Admitting: Family

## 2018-07-01 VITALS — BP 140/52 | HR 73 | Temp 98.2°F | Wt 87.8 lb

## 2018-07-01 DIAGNOSIS — I10 Essential (primary) hypertension: Secondary | ICD-10-CM | POA: Diagnosis not present

## 2018-07-01 DIAGNOSIS — Z794 Long term (current) use of insulin: Secondary | ICD-10-CM

## 2018-07-01 DIAGNOSIS — M7989 Other specified soft tissue disorders: Secondary | ICD-10-CM | POA: Diagnosis not present

## 2018-07-01 DIAGNOSIS — E1159 Type 2 diabetes mellitus with other circulatory complications: Secondary | ICD-10-CM

## 2018-07-01 NOTE — Progress Notes (Signed)
Subjective:    Patient ID: Tiffany Bailey, female    DOB: 1927/11/13, 85 y.o.   MRN: [id number]  CC: SOL ODOR is a 85 y.o. female who presents today for follow up.   HPI: Accompanied by daughter.   Left arm swelling- improved. Swelling started in forearm and extends to the wrist. Trouble wearing watch.  Started 2 months ago. Suspects from lantus. Left arm no longer itching. DId take 3 day course of lasix and unsure if helped. No numbness. No rash.   Seen in our clinic, January 13 for left arm swelling  .  Had been taking Lasix doses.  Negative for DVT June 02, 2018.   DM- following with endocrinology 04/2018, continue glipizide 2.5 daily. Itching from site injection. stopped Lantus 6 units 2 weeks. Also feels like lantus had caused HA. Post prandial blood sugar is running in 300. Vision hasnt changed.  b   CAD- On plavix. Last office visit with cardiology, Dr. Rockey Situ July 2019    HISTORY:  Past Medical History:  Diagnosis Date  . Anemia   . Anginal pain ([place])   . Arthritis   . Cancer (HCC)    BREAST  . CKD (chronic kidney disease), stage III ([place])   . Coronary artery disease    S/P CABG and multiple PCI's  . Diabetes mellitus without complication ([place])   . Edema   . Failure to thrive in adult   . GERD (gastroesophageal reflux disease)   . History of breast cancer   . HOH (hard of hearing)   . Hyperlipidemia   . Hypertension   . Myocardial infarction ([place])   . Palpitations    Past Surgical History:  Procedure Laterality Date  . ABDOMINAL HYSTERECTOMY    . ABOVE KNEE LEG AMPUTATION Left 2013  . ANTERIOR VITRECTOMY Left 11/16/2015   Procedure: ANTERIOR VITRECTOMY;  Surgeon: Eulogio Bear, MD;  Location: [facility] ORS;  Service: Ophthalmology;  Laterality: Left;  . BLADDER SURGERY    . CATARACT EXTRACTION W/PHACO Left 11/16/2015   Procedure: CATARACT EXTRACTION PHACO AND INTRAOCULAR LENS PLACEMENT (IOC);  Surgeon: Eulogio Bear, MD;  Location: [facility] ORS;  Service:  Ophthalmology;  Laterality: Left;  Lot # [id number] H [place]; 01:22.8 AP%:12.6 CDE: 10.46  . CATARACT EXTRACTION W/PHACO Right 08/22/2016   Procedure: CATARACT EXTRACTION PHACO AND INTRAOCULAR LENS PLACEMENT (IOC);  Surgeon: Eulogio Bear, MD;  Location: [facility] ORS;  Service: Ophthalmology;  Laterality: Right;  Lot # [id number] H [place]: 00:52.1 AP%:8.7 CDE: 4.99   . CORONARY ANGIOPLASTY     STENT  . CORONARY ARTERY BYPASS GRAFT    . MASTECTOMY    . TUMOR REMOVAL     ABDOMINAL   Family History  Problem Relation Age of Onset  . Sudden death Mother   . Arthritis Father   . Stroke Father   . Breast cancer Sister   . Diabetes Grandchild     Allergies: Amoxicillin; Gabapentin; Mirtazapine; and Other Current Outpatient Medications on File Prior to Visit  Medication Sig Dispense Refill  . acetaminophen (TYLENOL) 500 MG tablet Take 1 tablet (500 mg total) by mouth every 6 (six) hours as needed. 30 tablet 0  . amLODipine (NORVASC) 10 MG tablet TAKE 1 TABLET BY MOUTH  DAILY 90 tablet 1  . aspirin EC 81 MG tablet Take 1 tablet (81 mg total) by mouth daily. 30 tablet 5  . atorvastatin (LIPITOR) 40 MG tablet TAKE 1 TABLET BY MOUTH  DAILY 90 tablet 1  .  BD PEN NEEDLE NANO U/F 32G X 4 MM MISC USE 1 DAILY 90 each 1  . blood glucose meter kit and supplies KIT Dispense based on patient and insurance preference. Use up to four times daily as directed. (FOR ICD-9 250.00, 250.01). 1 each 0  . clopidogrel (PLAVIX) 75 MG tablet TAKE 1 TABLET BY MOUTH  DAILY 90 tablet 3  . ezetimibe (ZETIA) 10 MG tablet Take 1 tablet (10 mg total) by mouth daily. 90 tablet 3  . fluticasone (FLONASE) 50 MCG/ACT nasal spray Place 1 spray into both nostrils daily. 16 g 5  . glucose blood (ONE TOUCH TEST STRIPS) test strip Use to test blood sugar twice daily 200 each 3  . loratadine (CLARITIN) 10 MG tablet Take 1 tablet (10 mg total) by mouth daily. 30 tablet 2  . metoprolol tartrate (LOPRESSOR) 25 MG tablet TAKE 1 TABLET BY MOUTH TWO   TIMES DAILY 180 tablet 0  . nitroGLYCERIN (NITROSTAT) 0.4 MG SL tablet Place 1 tablet (0.4 mg total) under the tongue every 5 (five) minutes as needed for chest pain. 25 tablet 4  . omega-3 acid ethyl esters (LOVAZA) 1 g capsule Take by mouth daily.    . ONE TOUCH LANCETS MISC Use as directed 2 times per day 200 each 3  . pantoprazole (PROTONIX) 40 MG tablet Take 1 tablet (40 mg total) by mouth daily. 90 tablet 1  . traMADol (ULTRAM) 50 MG tablet Take 1 tablet (50 mg total) by mouth 3 (three) times daily as needed for up to 10 doses for severe pain. 10 tablet 0  . furosemide (LASIX) 20 MG tablet Take 1 tablet (20 mg total) by mouth daily for 3 days. 3 tablet 0  . Insulin Glargine (LANTUS SOLOSTAR) 100 UNIT/ML Solostar Pen Inject 4 Units into the skin daily. (Patient not taking: Reported on 07/01/2018) 1 pen 6   No current facility-administered medications on file prior to visit.     Social History   Tobacco Use  . Smoking status: Never Smoker  . Smokeless tobacco: Never Used  Substance Use Topics  . Alcohol use: No  . Drug use: No    Review of Systems  Constitutional: Negative for chills and fever.  Respiratory: Negative for cough and shortness of breath.   Cardiovascular: Negative for chest pain and palpitations.  Gastrointestinal: Negative for nausea and vomiting.  Musculoskeletal: Positive for joint swelling (improved).  Skin: Negative for rash and wound.      Objective:    BP (!) 140/52 (BP Location: Left Arm, Patient Position: Sitting, Cuff Size: Normal)   Pulse 73   Temp 98.2 F (36.8 C)   Wt 87 lb 12.8 oz (39.8 kg)   SpO2 95%   BMI 17.15 kg/m  BP Readings from Last 3 Encounters:  07/01/18 (!) 140/52  06/29/18 (!) 148/60  06/05/18 128/60   Wt Readings from Last 3 Encounters:  07/01/18 87 lb 12.8 oz (39.8 kg)  03/30/18 82 lb (37.2 kg)  03/27/18 82 lb (37.2 kg)    Physical Exam Vitals signs reviewed.  Constitutional:      Appearance: She is well-developed.    Eyes:     Conjunctiva/sclera: Conjunctivae normal.  Cardiovascular:     Rate and Rhythm: Normal rate and regular rhythm.     Pulses: Normal pulses.     Comments: Palpable radial pulses bilaterally. Pulmonary:     Effort: Pulmonary effort is normal.     Breath sounds: Normal breath sounds. No wheezing, rhonchi or  rales.  Musculoskeletal:     Left forearm: She exhibits swelling and edema. She exhibits no tenderness, no bony tenderness and no deformity.       Arms:     Comments: Well circumscribed approximately 3 cm area of asymmetry noted on the left ventral aspect of the forearm as noted on diagram when compared to right forearm..  Nontender.  Skin is intact.  No increased erythema, warmth appreciated. No joint pain, joint swelling.   Skin:    General: Skin is warm and dry.  Neurological:     Mental Status: She is alert.  Psychiatric:        Speech: Speech normal.        Behavior: Behavior normal.        Thought Content: Thought content normal.        Assessment & Plan:   Problem List Items Addressed This Visit      Cardiovascular and Mediastinum   Essential hypertension    Overall pleased blood pressure patient today.  History of labile, uncontrolled HTN.  Based on diastolic blood pressure today, I would not increase regimen at this time.  Continue regimen        Endocrine   DM (diabetes mellitus) ([place])    Uncontrolled.  Patient is adamant that insulin have recently caused left arm swelling.  She politely declines trying another long-acting insulin today.  We have contacted her endocrinologist office to make them aware of this and also make follow-up for patient in 1 week (earliest they could see patient).        Other   Left arm swelling - Primary    Per patient, swelling is overall improved.  On exam, obvious area of asymmetry.  Discriminate area of asymmetry when compared to right forearm.  Advised like to proceed with repeat venous ultrasound, soft tissue  ultrasound to evaluate for possible lipoma.  Also advised that in the setting of peripheral arterial disease, coronary arterial disease, question whether peripheral arterial disease playing a role.  Advised consult with vascular for further evaluation.  Patient is very polite however she adamantly declines any further imaging, consult at this time.  She feels well as the swelling is decreased, and like to continue monitoring.  She states that she will give me a call if it does not completely resolve or new symptoms develop.          I have discontinued [place] doxycycline. I am also having her maintain her blood glucose meter kit and supplies, acetaminophen, aspirin EC, BD PEN NEEDLE NANO U/F, clopidogrel, ONE TOUCH LANCETS, glucose blood, Insulin Glargine, fluticasone, nitroGLYCERIN, omega-3 acid ethyl esters, pantoprazole, ezetimibe, traMADol, furosemide, loratadine, metoprolol tartrate, atorvastatin, and amLODipine.   No orders of the defined types were placed in this encounter.   Return precautions given.   Risks, benefits, and alternatives of the medications and treatment plan prescribed today were discussed, and patient expressed understanding.   Education regarding symptom management and diagnosis given to patient on AVS.  Continue to follow with [name], FNP for routine health maintenance.   Tiffany Bailey and I agreed with plan.   [name], FNP

## 2018-07-01 NOTE — Assessment & Plan Note (Signed)
Uncontrolled.  Patient is adamant that insulin have recently caused left arm swelling.  She politely declines trying another long-acting insulin today.  We have contacted her endocrinologist office to make them aware of this and also make follow-up for patient in 1 week (earliest they could see patient).

## 2018-07-01 NOTE — Assessment & Plan Note (Signed)
Per patient, swelling is overall improved.  On exam, obvious area of asymmetry.  Discriminate area of asymmetry when compared to right forearm.  Advised like to proceed with repeat venous ultrasound, soft tissue ultrasound to evaluate for possible lipoma.  Also advised that in the setting of peripheral arterial disease, coronary arterial disease, question whether peripheral arterial disease playing a role.  Advised consult with vascular for further evaluation.  Patient is very polite however she adamantly declines any further imaging, consult at this time.  She feels well as the swelling is decreased, and like to continue monitoring.  She states that she will give me a call if it does not completely resolve or new symptoms develop.

## 2018-07-01 NOTE — Patient Instructions (Signed)
As discussed, I would like to do additional ultrasound studies of your left forearm, please let me know if you change your mind regarding having this done.    Let me know if have arm swelling persists or new symptoms develop.    Please also ensure that you follow-up with endocrine and ensure you keep appointment we made for you today.

## 2018-07-01 NOTE — Assessment & Plan Note (Signed)
Overall pleased blood pressure patient today.  History of labile, uncontrolled HTN.  Based on diastolic blood pressure today, I would not increase regimen at this time.  Continue regimen

## 2018-07-08 ENCOUNTER — Telehealth: Payer: Self-pay

## 2018-07-08 DIAGNOSIS — M7989 Other specified soft tissue disorders: Secondary | ICD-10-CM

## 2018-07-08 DIAGNOSIS — J3089 Other allergic rhinitis: Secondary | ICD-10-CM

## 2018-07-08 DIAGNOSIS — K219 Gastro-esophageal reflux disease without esophagitis: Secondary | ICD-10-CM

## 2018-07-08 DIAGNOSIS — E785 Hyperlipidemia, unspecified: Secondary | ICD-10-CM

## 2018-07-08 DIAGNOSIS — R0981 Nasal congestion: Secondary | ICD-10-CM

## 2018-07-08 DIAGNOSIS — J0110 Acute frontal sinusitis, unspecified: Secondary | ICD-10-CM

## 2018-07-08 MED ORDER — PANTOPRAZOLE SODIUM 40 MG PO TBEC: 40.0000 mg | DELAYED_RELEASE_TABLET | Freq: Every day | ORAL | 1 refills | Status: DC

## 2018-07-08 MED ORDER — LORATADINE 10 MG PO TABS: 10.0000 mg | ORAL_TABLET | Freq: Every day | ORAL | 2 refills | Status: DC

## 2018-07-08 MED ORDER — ATORVASTATIN CALCIUM 40 MG PO TABS: 40.0000 mg | ORAL_TABLET | Freq: Every day | ORAL | 1 refills | Status: DC

## 2018-07-08 MED ORDER — FUROSEMIDE 20 MG PO TABS: 20.0000 mg | ORAL_TABLET | Freq: Every day | ORAL | 0 refills | Status: DC

## 2018-07-08 MED ORDER — METOPROLOL TARTRATE 25 MG PO TABS: 25.0000 mg | ORAL_TABLET | Freq: Two times a day (BID) | ORAL | 0 refills | Status: DC

## 2018-07-08 MED ORDER — EZETIMIBE 10 MG PO TABS: 10.0000 mg | ORAL_TABLET | Freq: Every day | ORAL | 3 refills | Status: DC

## 2018-07-08 MED ORDER — FLUTICASONE PROPIONATE 50 MCG/ACT NA SUSP: 1.0000 | Freq: Every day | NASAL | 5 refills | Status: DC

## 2018-07-08 MED ORDER — CLOPIDOGREL BISULFATE 75 MG PO TABS: 75.0000 mg | ORAL_TABLET | Freq: Every day | ORAL | 3 refills | Status: DC

## 2018-07-08 MED ORDER — AMLODIPINE BESYLATE 10 MG PO TABS: 10.0000 mg | ORAL_TABLET | Freq: Every day | ORAL | 1 refills | Status: DC

## 2018-07-08 NOTE — Telephone Encounter (Signed)
Copied from [place]. Topic: General - Other >> Jul 08, 2018  9:58 AM Judyann Munson wrote: Reason for CRM: patient is calling to state aenta insurance advise her to her all her medication to be called in to the number listed below: [phone]   Please advise

## 2018-07-15 ENCOUNTER — Other Ambulatory Visit: Payer: Self-pay | Admitting: Family

## 2018-07-15 DIAGNOSIS — J0110 Acute frontal sinusitis, unspecified: Secondary | ICD-10-CM

## 2018-07-15 DIAGNOSIS — R0981 Nasal congestion: Secondary | ICD-10-CM

## 2018-07-15 MED ORDER — LORATADINE 10 MG PO TABS: 10.0000 mg | ORAL_TABLET | Freq: Every day | ORAL | 2 refills | Status: DC

## 2018-07-15 NOTE — Telephone Encounter (Signed)
Copied from [place] [phone] [phone]. Topic: Quick Communication - Rx Refill/Question >> Jul 15, 2018  3:33 PM Windy Kalata wrote: Medication: loratadine (CLARITIN) 10 MG tablet  Has the patient contacted their pharmacy? Yes.   (Agent: If no, request that the patient contact the pharmacy for the refill.) (Agent: If yes, when and what did the pharmacy advise?) [facility] states that they are not able to fill her prescription can it be resent to different pharmacy  Preferred Pharmacy (with phone number or street name):  [facility] (N), [place] [phone] (Phone) [phone] (Fax)   Agent: Please be advised that RX refills may take up to 3 business days. We ask that you follow-up with your pharmacy.

## 2018-08-03 ENCOUNTER — Other Ambulatory Visit: Payer: Self-pay

## 2018-08-03 ENCOUNTER — Telehealth: Payer: Self-pay | Admitting: Family

## 2018-08-03 ENCOUNTER — Ambulatory Visit: Payer: Self-pay | Admitting: *Deleted

## 2018-08-03 DIAGNOSIS — IMO0002 Reserved for concepts with insufficient information to code with codable children: Secondary | ICD-10-CM

## 2018-08-03 DIAGNOSIS — E1165 Type 2 diabetes mellitus with hyperglycemia: Secondary | ICD-10-CM

## 2018-08-03 DIAGNOSIS — E1151 Type 2 diabetes mellitus with diabetic peripheral angiopathy without gangrene: Secondary | ICD-10-CM

## 2018-08-03 MED ORDER — INSULIN PEN NEEDLE 32G X 4 MM MISC: 12 refills | Status: DC
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
## 2018-08-03 NOTE — Telephone Encounter (Signed)
I called patient to clarify & I have sent in 32 G 4 MM pen needles for patent to local pharmacy.

## 2018-08-03 NOTE — Telephone Encounter (Signed)
Copied from [place] [id number]. Topic: Quick Communication - Rx Refill/Question >> Aug 03, 2018 11:25 AM Judyann Munson wrote: Medication: BD AutoShield Duo Pen Needle - BD  Has the patient contacted their pharmacy? yes  Preferred Pharmacy (with phone number or street name): [facility] ([facility]), [place] [phone] (Phone) [phone] (Fax)    Agent: Please be advised that RX refills may take up to 3 business days. We ask that you follow-up with your pharmacy.

## 2018-08-10 ENCOUNTER — Telehealth: Payer: Self-pay | Admitting: Family

## 2018-08-10 NOTE — Telephone Encounter (Signed)
Copied from [place] [phone]. Topic: Quick Communication - See Telephone Encounter >> Aug 10, 2018  1:21 PM Vernona Rieger wrote: CRM for notification. See Telephone encounter for: 08/10/18.  Patient states she needs a new prostate leg. She said she needs it sent to [facility] ". She said the number is [phone]. She said it has to be a paper script. Patient was very unsure of the information she was giving me. This is all the information that I could get out of her. Please advise.

## 2018-08-14 NOTE — Telephone Encounter (Signed)
Call pt  Explain that likely she will need OV to document for insurance  Will write rx for DME then

## 2018-08-14 NOTE — Telephone Encounter (Signed)
Patient scheduled 08/26/18 to discuss.

## 2018-08-26 ENCOUNTER — Ambulatory Visit (INDEPENDENT_AMBULATORY_CARE_PROVIDER_SITE_OTHER): Payer: Medicare HMO | Admitting: Family

## 2018-08-26 ENCOUNTER — Other Ambulatory Visit: Payer: Self-pay

## 2018-08-26 ENCOUNTER — Encounter: Payer: Self-pay | Admitting: Family

## 2018-08-26 VITALS — BP 125/60 | HR 59 | Temp 97.7°F

## 2018-08-26 DIAGNOSIS — J0100 Acute maxillary sinusitis, unspecified: Secondary | ICD-10-CM | POA: Diagnosis not present

## 2018-08-26 DIAGNOSIS — E1159 Type 2 diabetes mellitus with other circulatory complications: Secondary | ICD-10-CM

## 2018-08-26 DIAGNOSIS — E785 Hyperlipidemia, unspecified: Secondary | ICD-10-CM | POA: Diagnosis not present

## 2018-08-26 DIAGNOSIS — I10 Essential (primary) hypertension: Secondary | ICD-10-CM

## 2018-08-26 DIAGNOSIS — Z794 Long term (current) use of insulin: Secondary | ICD-10-CM

## 2018-08-26 DIAGNOSIS — Z89612 Acquired absence of left leg above knee: Secondary | ICD-10-CM | POA: Diagnosis not present

## 2018-08-26 MED ORDER — LEVOCETIRIZINE DIHYDROCHLORIDE 5 MG PO TABS: 5.0000 mg | ORAL_TABLET | Freq: Every evening | ORAL | 1 refills | Status: DC

## 2018-08-26 MED ORDER — CYCLOBENZAPRINE HCL 5 MG PO TABS: 5.0000 mg | ORAL_TABLET | Freq: Every evening | ORAL | 0 refills | Status: DC | PRN

## 2018-08-26 NOTE — Assessment & Plan Note (Addendum)
New prescription given for prosthesis.Suspect phantom pain. Benign exam. Trial of flexeril at bedtime for pain.  Patient will let me know if no improvement

## 2018-08-26 NOTE — Assessment & Plan Note (Signed)
Elevated today systolically however patient hasnt  Taken blood pressure medications yet today.  Advised her to do so when she gets home.  She will let me know if blood pressure has not improved.

## 2018-08-26 NOTE — Assessment & Plan Note (Addendum)
Reassuring neurologic, HEENT exam.  She is nontoxic in appearance .suspect allergic etiology. .  Will try Xyzal.  Patient will stop Claritin.  She politely declines MRI of the brain today.  She has no headache today.   She will let me know how she does on the Xyzal

## 2018-08-26 NOTE — Patient Instructions (Addendum)
Trial of of flexeril which a muscle relaxant at bedtime for leg pain  Monitor blood pressure,  Goal is less than 120/80, based on newest guidelines; if persistently higher, please make sooner follow up appointment so we can recheck you blood pressure and manage medications  Today we discussed referrals, orders. prosthesis   I have placed these orders in the system for you.  Please be sure to give [place] a call if you have not heard from our office regarding this. We should hear from [place] within ONE week with information regarding your appointment. If not, please let me know immediately. \  STOP claritin. Trial of Xyzal  Please stay vigilant let me know if sinus pressure,  headache, particularly headache does not improve

## 2018-08-26 NOTE — Progress Notes (Signed)
Subjective:    Patient ID: Tiffany Bailey, female    DOB: [birth year], 85 y.o.   MRN: [id number]  CC: Tiffany Bailey is a 85 y.o. female who presents today for follow up.   HPI: Accompanied by son  Today to discuss prescription for prothesis leg.Old one hurts her leg. Would like to start using again.   H/o PVD- left AKA 2013.   Complains of left leg pain , chronic.  More so at night. Can feel pain in left great toe which isnt there anymore. Sharp, stabbing pain.  In wheelchair. Gabapentin didn't work.  No alcohol use, recent falls.   Complains of clear runny nasal congestion for 2 months, unchanged. Comes and goes, notes especially in early part of the Spring.  Feels 'sinus headache' on left side to nose, radiates to side of jaw.  No headache today. No sudden vision changes, fever.  On claritin, flonase with relief. However seems claritin doesn't seem to work as well.   Hypertension-has not taken blood pressure medications yet this morning Denies exertional chest pain or pressure, numbness or tingling radiating to left arm or jaw, palpitations, dizziness,  changes in vision, or shortness of breath.   DM- following with Dr Gabriel Carina. Improved since stopped drinking coke. No hypoglycemic episodes.     HISTORY:  Past Medical History:  Diagnosis Date  . Anemia   . Anginal pain ([place])   . Arthritis   . Cancer (HCC)    BREAST  . CKD (chronic kidney disease), stage III ([place])   . Coronary artery disease    S/P CABG and multiple PCI's  . Diabetes mellitus without complication ([place])   . Edema   . Failure to thrive in adult   . GERD (gastroesophageal reflux disease)   . History of breast cancer   . HOH (hard of hearing)   . Hyperlipidemia   . Hypertension   . Myocardial infarction ([place])   . Palpitations    Past Surgical History:  Procedure Laterality Date  . ABDOMINAL HYSTERECTOMY    . ABOVE KNEE LEG AMPUTATION Left 2013  . ANTERIOR VITRECTOMY Left 11/16/2015   Procedure: ANTERIOR  VITRECTOMY;  Surgeon: Eulogio Bear, MD;  Location: [facility] ORS;  Service: Ophthalmology;  Laterality: Left;  . BLADDER SURGERY    . CATARACT EXTRACTION W/PHACO Left 11/16/2015   Procedure: CATARACT EXTRACTION PHACO AND INTRAOCULAR LENS PLACEMENT (IOC);  Surgeon: Eulogio Bear, MD;  Location: [facility] OR[facility];  Service: Ophthalmology;  Laterality: Left;  Lot # [id number] H [place]; 01:22.8 AP%:12.6 CDE: 10.46  . CATARACT EXTRACTION W/PHACO Right 08/22/2016   Procedure: CATARACT EXTRACTION PHACO AND INTRAOCULAR LENS PLACEMENT (IOC);  Surgeon: Eulogio Bear, MD;  Location: [facility] ORS;  Service: Ophthalmology;  Laterality: Right;  Lot # [id number] H [place]: 00:52.1 AP%:8.7 CDE: 4.99   . CORONARY ANGIOPLASTY     STENT  . CORONARY ARTERY BYPASS GRAFT    . MASTECTOMY    . TUMOR REMOVAL     ABDOMINAL   Family History  Problem Relation Age of Onset  . Sudden death Mother   . Arthritis Father   . Stroke Father   . Breast cancer Sister   . Diabetes Grandchild     Allergies: Amoxicillin; Gabapentin; Mirtazapine; and Other Current Outpatient Medications on File Prior to Visit  Medication Sig Dispense Refill  . acetaminophen (TYLENOL) 500 MG tablet Take 1 tablet (500 mg total) by mouth every 6 (six) hours as needed. 30 tablet 0  . amLODipine (NORVASC) 10  MG tablet Take 1 tablet (10 mg total) by mouth daily. 90 tablet 1  . aspirin EC 81 MG tablet Take 1 tablet (81 mg total) by mouth daily. 30 tablet 5  . atorvastatin (LIPITOR) 40 MG tablet Take 1 tablet (40 mg total) by mouth daily. 90 tablet 1  . blood glucose meter kit and supplies KIT Dispense based on patient and insurance preference. Use up to four times daily as directed. (FOR ICD-9 250.00, 250.01). 1 each 0  . clopidogrel (PLAVIX) 75 MG tablet Take 1 tablet (75 mg total) by mouth daily. 90 tablet 3  . ezetimibe (ZETIA) 10 MG tablet Take 1 tablet (10 mg total) by mouth daily. 90 tablet 3  . fluticasone (FLONASE) 50 MCG/ACT nasal spray Place 1 spray into both  nostrils daily. 16 g 5  . glucose blood (ONE TOUCH TEST STRIPS) test strip Use to test blood sugar twice daily 200 each 3  . Insulin Glargine, 1 Unit Dial, 300 UNIT/ML SOPN Inject 5 Units into the skin at bedtime.    . Insulin Pen Needle (BD PEN NEEDLE NANO U/F) 32G X 4 MM MISC USE 1 DAILY 100 each 12  . loratadine (CLARITIN) 10 MG tablet Take 1 tablet (10 mg total) by mouth daily. 30 tablet 2  . metoprolol tartrate (LOPRESSOR) 25 MG tablet Take 1 tablet (25 mg total) by mouth 2 (two) times daily. 180 tablet 0  . nitroGLYCERIN (NITROSTAT) 0.4 MG SL tablet Place 1 tablet (0.4 mg total) under the tongue every 5 (five) minutes as needed for chest pain. 25 tablet 4  . omega-3 acid ethyl esters (LOVAZA) 1 g capsule Take by mouth daily.    . ONE TOUCH LANCETS MISC Use as directed 2 times per day 200 each 3  . pantoprazole (PROTONIX) 40 MG tablet Take 1 tablet (40 mg total) by mouth daily. 90 tablet 1  . furosemide (LASIX) 20 MG tablet Take 1 tablet (20 mg total) by mouth daily for 3 days. 3 tablet 0   No current facility-administered medications on file prior to visit.     Social History   Tobacco Use  . Smoking status: Never Smoker  . Smokeless tobacco: Never Used  Substance Use Topics  . Alcohol use: No  . Drug use: No    Review of Systems  Constitutional: Negative for chills and fever.  HENT: Positive for congestion, postnasal drip, rhinorrhea and sinus pain.   Eyes: Negative for visual disturbance.  Respiratory: Negative for cough.   Cardiovascular: Negative for chest pain and palpitations.  Gastrointestinal: Negative for nausea and vomiting.  Musculoskeletal: Positive for arthralgias (left leg).  Neurological: Positive for headaches.      Objective:    BP 125/60   Pulse (!) 59   Temp 97.7 F (36.5 C)   SpO2 97%  BP Readings from Last 3 Encounters:  08/26/18 125/60  07/01/18 (!) 140/52  06/29/18 (!) 148/60   Wt Readings from Last 3 Encounters:  07/01/18 87 lb 12.8 oz  (39.8 kg)  03/30/18 82 lb (37.2 kg)  03/27/18 82 lb (37.2 kg)    Physical Exam Vitals signs reviewed.  Constitutional:      Appearance: She is well-developed.  HENT:     Head: Normocephalic and atraumatic.     Right Ear: Hearing, tympanic membrane, ear canal and external ear normal. No decreased hearing noted. No drainage, swelling or tenderness. No middle ear effusion. No foreign body. Tympanic membrane is not erythematous or bulging.  Left Ear: Hearing, tympanic membrane, ear canal and external ear normal. No decreased hearing noted. No drainage, swelling or tenderness.  No middle ear effusion. No foreign body. Tympanic membrane is not erythematous or bulging.     Nose: Nose normal. No rhinorrhea.     Right Sinus: No maxillary sinus tenderness or frontal sinus tenderness.     Left Sinus: No maxillary sinus tenderness or frontal sinus tenderness.     Mouth/Throat:     Pharynx: Uvula midline. No oropharyngeal exudate or posterior oropharyngeal erythema.     Tonsils: No tonsillar abscesses.  Eyes:     Conjunctiva/sclera: Conjunctivae normal.     Pupils: Pupils are equal, round, and reactive to light.     Comments: Fundus normal bilaterally.   Cardiovascular:     Rate and Rhythm: Normal rate and regular rhythm.     Pulses: Normal pulses.     Heart sounds: Normal heart sounds.  Pulmonary:     Effort: Pulmonary effort is normal.     Breath sounds: Normal breath sounds. No wheezing, rhonchi or rales.  Musculoskeletal:     Comments: Left AKA.  No wounds, lesions at site.  No pain with palpation of distal femur.  No swelling, erythema, or increased warmth noted at old incision site.  Lymphadenopathy:     Head:     Right side of head: No submental, submandibular, tonsillar, preauricular, posterior auricular or occipital adenopathy.     Left side of head: No submental, submandibular, tonsillar, preauricular, posterior auricular or occipital adenopathy.     Cervical: No cervical  adenopathy.  Skin:    General: Skin is warm and dry.  Neurological:     Mental Status: She is alert.     Cranial Nerves: No cranial nerve deficit.     Sensory: No sensory deficit.     Deep Tendon Reflexes:     Reflex Scores:      Bicep reflexes are 2+ on the right side and 2+ on the left side.      Patellar reflexes are 2+ on the right side and 2+ on the left side.    Comments: Grip equal and strong bilateral upper extremities. Gait strong and steady. Able to perform rapid alternating movement without difficulty.   Psychiatric:        Speech: Speech normal.        Behavior: Behavior normal.        Thought Content: Thought content normal.        Assessment & Plan:   Problem List Items Addressed This Visit      Cardiovascular and Mediastinum   Essential hypertension    Elevated today systolically however patient hasnt  Taken blood pressure medications yet today.  Advised her to do so when she gets home.  She will let me know if blood pressure has not improved.      Relevant Orders   Comp Met (CMET)     Respiratory   Acute non-recurrent maxillary sinusitis    Reassuring neurologic, HEENT exam.  She is nontoxic in appearance .suspect allergic etiology. .  Will try Xyzal.  Patient will stop Claritin.  She politely declines MRI of the brain today.  She has no headache today.   She will let me know how she does on the Xyzal      Relevant Medications   levocetirizine (XYZAL) 5 MG tablet     Endocrine   DM (diabetes mellitus) ([place])    Following regularly with Dr. Gabriel Carina, will  follow      Relevant Medications   Insulin Glargine, 1 Unit Dial, 300 UNIT/ML SOPN     Other   Hyperlipidemia   Relevant Orders   Lipid panel   Hx of AKA (above knee amputation), left (HCC) - Primary    New prescription given for prosthesis.Suspect phantom pain. Benign exam. Trial of flexeril at bedtime for pain.  Patient will let me know if no improvement      Relevant Medications    cyclobenzaprine (FLEXERIL) 5 MG tablet   Other Relevant Orders   DME Other see comment       I have discontinued Lilyan Gilford Insulin Glargine and traMADol. I am also having her start on levocetirizine and cyclobenzaprine. Additionally, I am having her maintain her blood glucose meter kit and supplies, acetaminophen, aspirin EC, ONE TOUCH LANCETS, glucose blood, nitroGLYCERIN, omega-3 acid ethyl esters, pantoprazole, metoprolol tartrate, furosemide, ezetimibe, clopidogrel, atorvastatin, amLODipine, fluticasone, loratadine, Insulin Pen Needle, and Insulin Glargine (1 Unit Dial).   Meds ordered this encounter  Medications  . levocetirizine (XYZAL) 5 MG tablet    Sig: Take 1 tablet (5 mg total) by mouth every evening.    Dispense:  30 tablet    Refill:  1    Order Specific Question:   Supervising Provider    Answer:   Deborra Medina L [[id number]]  . cyclobenzaprine (FLEXERIL) 5 MG tablet    Sig: Take 1 tablet (5 mg total) by mouth at bedtime as needed for muscle spasms.    Dispense:  30 tablet    Refill:  0    Order Specific Question:   Supervising Provider    Answer:   Crecencio Mc [[id number]]    Return precautions given.   Risks, benefits, and alternatives of the medications and treatment plan prescribed today were discussed, and patient expressed understanding.   Education regarding symptom management and diagnosis given to patient on AVS.  Continue to follow with [name], FNP for routine health maintenance.   Tiffany Bailey and I agreed with plan.   [name], FNP

## 2018-08-26 NOTE — Assessment & Plan Note (Signed)
Following regularly with Dr. Gabriel Carina, will follow

## 2018-09-16 ENCOUNTER — Telehealth: Payer: Self-pay | Admitting: Family Medicine

## 2018-09-16 NOTE — Telephone Encounter (Signed)
Has appt for 09/30/2018. ?change to web ex or phone?  Also she is due for labs -- maybe schedule labs for later date?  Due for   A1c and other labs  TDAP  DEXA scan

## 2018-09-16 NOTE — Telephone Encounter (Signed)
Visit changed to [facility] & email sent to patient's daughter Ms. Ratliff.

## 2018-09-29 ENCOUNTER — Telehealth: Payer: Self-pay

## 2018-09-29 NOTE — Telephone Encounter (Signed)
Copied from [place] [id number]. Topic: General - Other >> Sep 29, 2018 12:07 PM Carolyn Stare wrote: Pt lvm on [facility] appt line, tried calling pt back her mailbox was full , she has a question about her appt on 09/30/2018

## 2018-09-30 ENCOUNTER — Encounter: Payer: Self-pay | Admitting: Family

## 2018-09-30 ENCOUNTER — Ambulatory Visit (INDEPENDENT_AMBULATORY_CARE_PROVIDER_SITE_OTHER): Payer: Medicare HMO | Admitting: Family

## 2018-09-30 DIAGNOSIS — J309 Allergic rhinitis, unspecified: Secondary | ICD-10-CM

## 2018-09-30 DIAGNOSIS — I10 Essential (primary) hypertension: Secondary | ICD-10-CM | POA: Diagnosis not present

## 2018-09-30 DIAGNOSIS — J0110 Acute frontal sinusitis, unspecified: Secondary | ICD-10-CM

## 2018-09-30 DIAGNOSIS — J0100 Acute maxillary sinusitis, unspecified: Secondary | ICD-10-CM

## 2018-09-30 DIAGNOSIS — R0981 Nasal congestion: Secondary | ICD-10-CM

## 2018-09-30 MED ORDER — LEVOCETIRIZINE DIHYDROCHLORIDE 5 MG PO TABS: 5.0000 mg | ORAL_TABLET | Freq: Every evening | ORAL | 1 refills | Status: DC

## 2018-09-30 NOTE — Progress Notes (Signed)
Verbal consent for services obtained from patient prior to services given.  Location of call:  provider at work patient at home  Names of all persons present for services: [name], NP  Chief complaint:   Feels well today, no new complaints. Last seen 1 month ago. Not on claritin and didn't try xyzal one month ago. Continues to have clear runny congestion. Sneezing, eyes itching.  HA's have resolved. No facial pain or fever. H/o seasonal Allergies   HTN- Cannot recall blood pressure and her BP machine is not working.  Compliant medication.  No chest pain, shortness of breath  Has new prosthesis; some relief flexeril.   History, background, results pertinent:  History of hypertension, diabetes, left leg above-knee amputation  A/P/next steps:  HTN-in the past, has waxed and waned.  Advised patient to call me with blood pressure readings.  Allergic rhinitis- unchanged.  Symptoms over call are consistent with allergic rhinitis.  Will start antihistamine.  Xyzal has been sent to pharmacy again.  Patient will let me know how she is doing.  I spent 15 min  discussing plan of care over the phone.

## 2018-10-02 NOTE — Patient Instructions (Signed)
Pleasure speaking with you.  Monitor blood pressure,  Goal is less than 120/80, based on newest guidelines; if persistently higher, please make sooner follow up appointment so we can recheck you blood pressure and manage medications  Please let me know if you need anything at all.  Stay safe!

## 2018-10-08 ENCOUNTER — Ambulatory Visit: Payer: Self-pay

## 2018-10-08 ENCOUNTER — Encounter: Payer: Self-pay | Admitting: Family Medicine

## 2018-10-08 ENCOUNTER — Ambulatory Visit (INDEPENDENT_AMBULATORY_CARE_PROVIDER_SITE_OTHER): Payer: Medicare HMO | Admitting: Family Medicine

## 2018-10-08 ENCOUNTER — Other Ambulatory Visit: Payer: Self-pay

## 2018-10-08 DIAGNOSIS — R51 Headache: Secondary | ICD-10-CM

## 2018-10-08 DIAGNOSIS — R0981 Nasal congestion: Secondary | ICD-10-CM

## 2018-10-08 DIAGNOSIS — R519 Headache, unspecified: Secondary | ICD-10-CM

## 2018-10-08 DIAGNOSIS — J301 Allergic rhinitis due to pollen: Secondary | ICD-10-CM

## 2018-10-08 MED ORDER — LORATADINE 10 MG PO TABS: 10.0000 mg | ORAL_TABLET | Freq: Every day | ORAL | 2 refills | Status: DC

## 2018-10-08 NOTE — Progress Notes (Signed)
Patient ID: Tiffany Bailey, female   DOB: 10-Nov-1927, 85 y.o.   MRN: [id number]  Virtual Visit via phone note  This visit type was conducted due to national recommendations for restrictions regarding the COVID-19 pandemic (e.g. social distancing).  This format is felt to be most appropriate for this patient at this time.  All issues noted in this document were discussed and addressed.  No physical exam was performed (except for noted visual exam findings with Video Visits).   I connected with Tiffany Bailey on 10/08/18 at 10:20 AM EDT by a telephone and verified that I am speaking with the correct person using two identifiers. Location patient: home Location provider: [place] Persons participating in the virtual visit: patient, provider  I discussed the limitations, risks, security and privacy concerns of performing an evaluation and management service by telephone and the availability of in person appointments. I also discussed with the patient that there may be a patient responsible charge related to this service. The patient expressed understanding and agreed to proceed.  Interactive audio and video telecommunications were attempted between this provider and patient, however failed, patient did not have access to video capability.  We continued and completed visit with audio only via telephone.   HPI:  Patient and I connected via telephone due to complaints of headache, sinus congestion with some clear nasal drainage.  Patient states her headache was worse this morning when she woke up, but she took a dose of Tylenol 1 hour ago and now her head is coughed down.  She does have Flonase nasal spray and uses that every day.  A few months ago she has been using Claritin to reduce congestion, but in the past many weeks, she has not been taking Claritin every day.  Patient believes the pollen is exacerbating her sinus issues.  Denies fever or chills.  Denies thick purulent discolored nasal  drainage.  Denies cough, shortness of breath or wheezing.  Denies GI or GU issues.  Denies loss of visual field, speech difficulty or sharp thunderclap type pain in head.   ROS: See pertinent positives and negatives per HPI.  Past Medical History:  Diagnosis Date  . Anemia   . Anginal pain ([place])   . Arthritis   . Cancer (HCC)    BREAST  . CKD (chronic kidney disease), stage III ([place])   . Coronary artery disease    S/P CABG and multiple PCI's  . Diabetes mellitus without complication ([place])   . Edema   . Failure to thrive in adult   . GERD (gastroesophageal reflux disease)   . History of breast cancer   . HOH (hard of hearing)   . Hyperlipidemia   . Hypertension   . Myocardial infarction ([place])   . Palpitations     Past Surgical History:  Procedure Laterality Date  . ABDOMINAL HYSTERECTOMY    . ABOVE KNEE LEG AMPUTATION Left 2013  . ANTERIOR VITRECTOMY Left 11/16/2015   Procedure: ANTERIOR VITRECTOMY;  Surgeon: Eulogio Bear, MD;  Location: [facility] ORS;  Service: Ophthalmology;  Laterality: Left;  . BLADDER SURGERY    . CATARACT EXTRACTION W/PHACO Left 11/16/2015   Procedure: CATARACT EXTRACTION PHACO AND INTRAOCULAR LENS PLACEMENT (IOC);  Surgeon: Eulogio Bear, MD;  Location: [facility];  Service: Ophthalmology;  Laterality: Left;  Lot # [id number] H [place]; 01:22.8 AP%:12.6 CDE: 10.46  . CATARACT EXTRACTION W/PHACO Right 08/22/2016   Procedure: CATARACT EXTRACTION PHACO AND INTRAOCULAR LENS PLACEMENT (IOC);  Surgeon: Eulogio Bear, MD;  Location: [facility] ORS;  Service: Ophthalmology;  Laterality: Right;  Lot # [id number] H [place]: 00:52.1 AP%:8.7 CDE: 4.99   . CORONARY ANGIOPLASTY     STENT  . CORONARY ARTERY BYPASS GRAFT    . MASTECTOMY    . TUMOR REMOVAL     ABDOMINAL    Family History  Problem Relation Age of Onset  . Sudden death Mother   . Arthritis Father   . Stroke Father   . Breast cancer Sister   . Diabetes Grandchild     Current Outpatient Medications:  .   acetaminophen (TYLENOL) 500 MG tablet, Take 1 tablet (500 mg total) by mouth every 6 (six) hours as needed., Disp: 30 tablet, Rfl: 0 .  amLODipine (NORVASC) 10 MG tablet, Take 1 tablet (10 mg total) by mouth daily., Disp: 90 tablet, Rfl: 1 .  aspirin EC 81 MG tablet, Take 1 tablet (81 mg total) by mouth daily., Disp: 30 tablet, Rfl: 5 .  atorvastatin (LIPITOR) 40 MG tablet, Take 1 tablet (40 mg total) by mouth daily., Disp: 90 tablet, Rfl: 1 .  blood glucose meter kit and supplies KIT, Dispense based on patient and insurance preference. Use up to four times daily as directed. (FOR ICD-9 250.00, 250.01)., Disp: 1 each, Rfl: 0 .  clopidogrel (PLAVIX) 75 MG tablet, Take 1 tablet (75 mg total) by mouth daily., Disp: 90 tablet, Rfl: 3 .  cyclobenzaprine (FLEXERIL) 5 MG tablet, Take 1 tablet (5 mg total) by mouth at bedtime as needed for muscle spasms., Disp: 30 tablet, Rfl: 0 .  ezetimibe (ZETIA) 10 MG tablet, Take 1 tablet (10 mg total) by mouth daily., Disp: 90 tablet, Rfl: 3 .  fluticasone (FLONASE) 50 MCG/ACT nasal spray, Place 1 spray into both nostrils daily., Disp: 16 g, Rfl: 5 .  glucose blood (ONE TOUCH TEST STRIPS) test strip, Use to test blood sugar twice daily, Disp: 200 each, Rfl: 3 .  Insulin Pen Needle (BD PEN NEEDLE NANO U/F) 32G X 4 MM MISC, USE 1 DAILY, Disp: 100 each, Rfl: 12 .  LANTUS SOLOSTAR 100 UNIT/ML Solostar Pen, Inject 5 Units into the skin at bedtime. , Disp: , Rfl:  .  levocetirizine (XYZAL) 5 MG tablet, Take 1 tablet (5 mg total) by mouth every evening., Disp: 30 tablet, Rfl: 1 .  metoprolol tartrate (LOPRESSOR) 25 MG tablet, Take 1 tablet (25 mg total) by mouth 2 (two) times daily., Disp: 180 tablet, Rfl: 0 .  nitroGLYCERIN (NITROSTAT) 0.4 MG SL tablet, Place 1 tablet (0.4 mg total) under the tongue every 5 (five) minutes as needed for chest pain., Disp: 25 tablet, Rfl: 4 .  omega-3 acid ethyl esters (LOVAZA) 1 g capsule, Take by mouth daily., Disp: , Rfl:  .  ONE TOUCH  LANCETS MISC, Use as directed 2 times per day, Disp: 200 each, Rfl: 3 .  pantoprazole (PROTONIX) 40 MG tablet, Take 1 tablet (40 mg total) by mouth daily., Disp: 90 tablet, Rfl: 1 .  furosemide (LASIX) 20 MG tablet, Take 1 tablet (20 mg total) by mouth daily for 3 days., Disp: 3 tablet, Rfl: 0  EXAM:  GENERAL: alert, oriented, sounds well and in no acute distress  LUNGS: Speaking in full sentences, no signs of respiratory distress, breathing rate appears normal, no gasping or wheezing  PSYCH/NEURO: pleasant and cooperative, no obvious depression or anxiety, speech and thought processing grossly intact  ASSESSMENT AND PLAN:  Discussed the following assessment and plan:  Sinus congestion - Plan: loratadine (CLARITIN) 10  MG tablet  Sinus headache - Plan: loratadine (CLARITIN) 10 MG tablet  Seasonal allergic rhinitis due to pollen - Plan: loratadine (CLARITIN) 10 MG tablet  I suspect patient's symptoms of sinus congestion, sinus headache are related to seasonal allergic rhinitis causing inflammation in sinuses.  Headache has gone down since taking Tylenol.  Advised she can continue to use Tylenol as needed for headache pain.  Advised to continue using nasal spray every morning and also to resume taking Claritin every day to dry up congestion and reduce bodies reaction to seasonal allergens.  Prescription for Claritin sent to the pharmacy.  Also suggested patient try doing saline nasal rinses to reduce congestion.  Advised patient to wear a mask when going out of the home to the grocery store and to be diligent with her handwashing.  Advised if she does sit outside on her porch, to give herself breaks in between sitting outdoors to allow sinuses to recover from being exposed to pollen.  Advised patient that I do not feel she needs antibiotic as her sinus drainage is clear which indicates the congestion and drainage is most likely from allergens rather than a bacterial infection.   I discussed the  assessment and treatment plan with the patient. The patient was provided an opportunity to ask questions and all were answered. The patient agreed with the plan and demonstrated an understanding of the instructions.   The patient was advised to call back or seek an in-person evaluation if the symptoms worsen or if the condition fails to improve as anticipated.  13 minutes spent on the telephone with patient.   Jodelle Green, FNP

## 2018-10-08 NOTE — Telephone Encounter (Signed)
Pt called to report severe sinus headache that started on Sunday tis week.  She states that her head hurts on the left side and it is under her eye on the left and into her jaw and teeth on the left.  She has mucus in her throat.  She states she is out of claritin. She has problems every spring.  She denies fever.  She states that warm drinks sooth the pain for a time.  Last night she treated her headache with 1000mg tylenol.  She states it did not help. Care advice read to patient. Pt call transferred to office for scheduling. Reason for Disposition . [1] SEVERE headache (e.g., excruciating) AND [2] not improved after 2 hours of pain medicine  Answer Assessment - Initial Assessment Questions 1. LOCATION: "Where does it hurt?"      Left side 2. ONSET: "When did the headache start?" (Minutes, hours or days)      1st of the week Sunday 3. PATTERN: "Does the pain come and go, or has it been constant since it started?"     consistant 4. SEVERITY: "How bad is the pain?" and "What does it keep you from doing?"  (e.g., Scale 1-10; mild, moderate, or severe)   - MILD (1-3): doesn\'t interfere with normal activities    - MODERATE (4-7): interferes with normal activities or awakens from sleep    - SEVERE (8-10): excruciating pain, unable to do any normal activities        10  5. RECURRENT SYMPTOM: "Have you ever had headaches before?" If so, ask: "When was the last time?" and "What happened that time?"      yes 6. CAUSE: "What do you think is causing the headache?"     Sinus 7. MIGRAINE: "Have you been diagnosed with migraine headaches?" If so, ask: "Is this headache similar?"      No 8. HEAD INJURY: "Has there been any recent injury to the head?"      No 9. OTHER SYMPTOMS: "Do you have any other symptoms?" (fever, stiff neck, eye pain, sore throat, cold symptoms)    Left eye cold symptoms 10. PREGNANCY: "Is there any chance you are pregnant?" "When was your last menstrual period?"       No  Protocols used: HEADACHE-A-AH

## 2018-10-20 ENCOUNTER — Telehealth: Payer: Self-pay | Admitting: Family

## 2018-10-20 DIAGNOSIS — Z89612 Acquired absence of left leg above knee: Secondary | ICD-10-CM

## 2018-10-20 NOTE — Telephone Encounter (Signed)
Copied from [place] [id number]. Topic: Quick Communication - See Telephone Encounter >> Oct 20, 2018  2:19 PM Berneta Levins wrote: CRM for notification. See Telephone encounter for: 10/20/18.  Colletta [place] from [employer].  States they recently delivered the pt a prosthetic leg and they now need a script for PT sent to them for prosthetic gate training. Fax to [phone] Phone: [phone]

## 2018-10-21 NOTE — Telephone Encounter (Signed)
I have printed and faxed.

## 2018-10-21 NOTE — Telephone Encounter (Signed)
I called [place] am awaiting a call back from [place] to see of we can give verbal for PT.

## 2018-10-21 NOTE — Telephone Encounter (Signed)
I ordered  Please print.

## 2018-10-21 NOTE — Telephone Encounter (Signed)
Can we fax script per the below?   I will sign

## 2018-10-27 ENCOUNTER — Other Ambulatory Visit: Payer: Self-pay

## 2018-10-27 ENCOUNTER — Ambulatory Visit: Payer: Self-pay | Admitting: Family

## 2018-10-27 ENCOUNTER — Ambulatory Visit (INDEPENDENT_AMBULATORY_CARE_PROVIDER_SITE_OTHER): Payer: Medicare HMO | Admitting: Family Medicine

## 2018-10-27 DIAGNOSIS — M25521 Pain in right elbow: Secondary | ICD-10-CM | POA: Diagnosis not present

## 2018-10-27 DIAGNOSIS — R519 Headache, unspecified: Secondary | ICD-10-CM

## 2018-10-27 DIAGNOSIS — R51 Headache: Secondary | ICD-10-CM

## 2018-10-27 DIAGNOSIS — G8929 Other chronic pain: Secondary | ICD-10-CM

## 2018-10-27 NOTE — Telephone Encounter (Signed)
Patient needs to be evaluated for this. Given the headache and eye pain I would advise an in person visit at urgent care so she can have her vision checked and have a neurological exam. My concern is that this could represent acute glaucoma which really needs to be evaluated in person. If she does not want to do this then she can keep the virtual visit with Lauren to determine appropriate management.

## 2018-10-27 NOTE — Telephone Encounter (Signed)
Pt called [facility]. Reports she has had a headache x 2 days. Felt like it was related to her Protonix so she stopped that medicine. Still has the headache - hurts on the left side and her eye hurts as well. Taking Tylenol and Claritin which "helps a little.Headache comes and goes."  Also complaining of right elbow pain. No swelling or injury to elbow. States she has problems with headaches every spring. Unable to do a virtual visit. Please advise pt.

## 2018-10-27 NOTE — Telephone Encounter (Signed)
Pt. Reports she has had a headache x 2 days. Felt like it was related to her Protonix so she stopped that medicine. Still has the headache - hurts on the left side and her eye hurts as well. Taking Tylenol and Claritin which "helps a little.Headache comes and goes."  Also complaining of right elbow pain. No swelling or injury to elbow. States she has problems with headaches every spring. Unable to do a virtual visit. Please advise pt.  Answer Assessment - Initial Assessment Questions 1. LOCATION: "Where does it hurt?"      Side - left side . Hurts in her eye as well 2. ONSET: "When did the headache start?" (Minutes, hours or days)      2 days ago 3. PATTERN: "Does the pain come and go, or has it been constant since it started?"     Comes and goes 4. SEVERITY: "How bad is the pain?" and "What does it keep you from doing?"  (e.g., Scale 1-10; mild, moderate, or severe)   - MILD (1-3): doesn't interfere with normal activities    - MODERATE (4-7): interferes with normal activities or awakens from sleep    - SEVERE (8-10): excruciating pain, unable to do any normal activities        8 5. RECURRENT SYMPTOM: "Have you ever had headaches before?" If so, ask: "When was the last time?" and "What happened that time?"      Yes 6. CAUSE: "What do you think is causing the headache?"     Protonix - pt. Stopped it. 7. MIGRAINE: "Have you been diagnosed with migraine headaches?" If so, ask: "Is this headache similar?"      No 8. HEAD INJURY: "Has there been any recent injury to the head?"      No 9. OTHER SYMPTOMS: "Do you have any other symptoms?" (fever, stiff neck, eye pain, sore throat, cold symptoms)     Left eye pain - no vision changes 10. PREGNANCY: "Is there any chance you are pregnant?" "When was your last menstrual period?"       No  Protocols used: HEADACHE-A-AH

## 2018-10-27 NOTE — Progress Notes (Signed)
Patient ID: Tiffany Bailey, female   DOB: 02/06/1928, 85 y.o.   MRN: [id number]    Virtual Visit via phone Note  This visit type was conducted due to national recommendations for restrictions regarding the COVID-19 pandemic (e.g. social distancing).  This format is felt to be most appropriate for this patient at this time.  All issues noted in this document were discussed and addressed.  No physical exam was performed (except for noted visual exam findings with Video Visits).   I connected with Clover Mealy today at  1:20 PM EDT by telephone and verified that I am speaking with the correct person using two identifiers. Location patient: home Location provider: [place] participating in the virtual visit: patient, provider  I discussed the limitations, risks, security and privacy concerns of performing an evaluation and management service by telephone and the availability of in person appointments. I also discussed with the patient that there may be a patient responsible charge related to this service. The patient expressed understanding and agreed to proceed.  HPI:  Patient and I connected via phone due to complaints of headache on left side of her head and also right elbow pain.  Patient has had headaches off and on for years, usually are on left side of head and will often include her eye aching.  States when she takes Tylenol and Claritin, usually headache will subside after a few hours.  Denies any loss of vision, denies floaters in visual field, denies black spots in visual field, denies double vision states now that the Tylenol is kicked in her eye and head no longer hurt.  Denies any head injury.  Denies a thunderclap style sharp pain in head.  Denies any numbness or tingling in extremities.  Denies speech difficulty.  Denies facial droop.  Patient has had CTA of her brain/neck back in October 2019, did show atherosclerotic changes.  It looks like a CT of the brain was ordered  as well however patient did not get the plain CT only had a CT angios performed.  Patient states her right elbow will ache off and on almost all the time.  Denies any new injury or fall that would cause acute pain.  States Tylenol does help elbow pain and also when she puts a compression stocking on elbow.  Range of motion of elbow is intact, states she has no issues with her grip.  Patient is right-handed.    ROS: See pertinent positives and negatives per HPI.  Past Medical History:  Diagnosis Date  . Anemia   . Anginal pain ([place])   . Arthritis   . Cancer (H[facility])    BREAST  . CKD (chronic kidney disease), stage III ([place])   . Coronary artery disease    S/P CABG and multiple PCI's  . Diabetes mellitus without complication ([place])   . Edema   . Failure to thrive in adult   . GERD (gastroesophageal reflux disease)   . History of breast cancer   . HOH (hard of hearing)   . Hyperlipidemia   . Hypertension   . Myocardial infarction ([place])   . Palpitations     Past Surgical History:  Procedure Laterality Date  . ABDOMINAL HYSTERECTOMY    . ABOVE KNEE LEG AMPUTATION Left 2013  . ANTERIOR VITRECTOMY Left 11/16/2015   Procedure: ANTERIOR VITRECTOMY;  Surgeon: Eulogio Bear, MD;  Location: [facility] ORS;  Service: Ophthalmology;  Laterality: Left;  . BLADDER SURGERY    . CATARACT EXTRACTION  W/PHACO Left 11/16/2015   Procedure: CATARACT EXTRACTION PHACO AND INTRAOCULAR LENS PLACEMENT (IOC);  Surgeon: Eulogio Bear, MD;  Location: [facility] ORS;  Service: Ophthalmology;  Laterality: Left;  Lot # [id number] H [place]; 01:22.8 AP%:12.6 CDE: 10.46  . CATARACT EXTRACTION W/PHACO Right 08/22/2016   Procedure: CATARACT EXTRACTION PHACO AND INTRAOCULAR LENS PLACEMENT (IOC);  Surgeon: Eulogio Bear, MD;  Location: [facility] ORS;  Service: Ophthalmology;  Laterality: Right;  Lot # [id number] H [place]: 00:52.1 AP%:8.7 CDE: 4.99   . CORONARY ANGIOPLASTY     STENT  . CORONARY ARTERY BYPASS GRAFT    . MASTECTOMY    .  TUMOR REMOVAL     ABDOMINAL    Family History  Problem Relation Age of Onset  . Sudden death Mother   . Arthritis Father   . Stroke Father   . Breast cancer Sister   . Diabetes Grandchild    Social History   Tobacco Use  . Smoking status: Never Smoker  . Smokeless tobacco: Never Used  Substance Use Topics  . Alcohol use: No    Current Outpatient Medications:  .  acetaminophen (TYLENOL) 500 MG tablet, Take 1 tablet (500 mg total) by mouth every 6 (six) hours as needed., Disp: 30 tablet, Rfl: 0 .  amLODipine (NORVASC) 10 MG tablet, Take 1 tablet (10 mg total) by mouth daily., Disp: 90 tablet, Rfl: 1 .  aspirin EC 81 MG tablet, Take 1 tablet (81 mg total) by mouth daily., Disp: 30 tablet, Rfl: 5 .  atorvastatin (LIPITOR) 40 MG tablet, Take 1 tablet (40 mg total) by mouth daily., Disp: 90 tablet, Rfl: 1 .  blood glucose meter kit and supplies KIT, Dispense based on patient and insurance preference. Use up to four times daily as directed. (FOR ICD-9 250.00, 250.01)., Disp: 1 each, Rfl: 0 .  clopidogrel (PLAVIX) 75 MG tablet, Take 1 tablet (75 mg total) by mouth daily., Disp: 90 tablet, Rfl: 3 .  cyclobenzaprine (FLEXERIL) 5 MG tablet, Take 1 tablet (5 mg total) by mouth at bedtime as needed for muscle spasms., Disp: 30 tablet, Rfl: 0 .  ezetimibe (ZETIA) 10 MG tablet, Take 1 tablet (10 mg total) by mouth daily., Disp: 90 tablet, Rfl: 3 .  fluticasone (FLONASE) 50 MCG/ACT nasal spray, Place 1 spray into both nostrils daily., Disp: 16 g, Rfl: 5 .  glucose blood (ONE TOUCH TEST STRIPS) test strip, Use to test blood sugar twice daily, Disp: 200 each, Rfl: 3 .  Insulin Pen Needle (BD PEN NEEDLE NANO U/F) 32G X 4 MM MISC, USE 1 DAILY, Disp: 100 each, Rfl: 12 .  LANTUS SOLOSTAR 100 UNIT/ML Solostar Pen, Inject 5 Units into the skin at bedtime. , Disp: , Rfl:  .  levocetirizine (XYZAL) 5 MG tablet, Take 1 tablet (5 mg total) by mouth every evening., Disp: 30 tablet, Rfl: 1 .  loratadine  (CLARITIN) 10 MG tablet, Take 1 tablet (10 mg total) by mouth daily., Disp: 30 tablet, Rfl: 2 .  metoprolol tartrate (LOPRESSOR) 25 MG tablet, Take 1 tablet (25 mg total) by mouth 2 (two) times daily., Disp: 180 tablet, Rfl: 0 .  nitroGLYCERIN (NITROSTAT) 0.4 MG SL tablet, Place 1 tablet (0.4 mg total) under the tongue every 5 (five) minutes as needed for chest pain., Disp: 25 tablet, Rfl: 4 .  omega-3 acid ethyl esters (LOVAZA) 1 g capsule, Take by mouth daily., Disp: , Rfl:  .  ONE TOUCH LANCETS MISC, Use as directed 2 times per day, Disp: 200 each,  Rfl: 3 .  furosemide (LASIX) 20 MG tablet, Take 1 tablet (20 mg total) by mouth daily for 3 days., Disp: 3 tablet, Rfl: 0  EXAM:  GENERAL: alert, oriented, sounds well and in no acute distress. Sounds like her normal self over the phone.   LUNGS: Speaking in full sentences. No signs of respiratory distress, breathing rate sounds normal, no obvious gross SOB, coughing, gasping or wheezing  PSYCH/NEURO: pleasant and cooperative, no obvious depression or anxiety, speech and thought processing grossly intact  ASSESSMENT AND PLAN:  Discussed the following assessment and plan:  Persistent headaches - Plan: CT Head Wo Contrast  Chronic nonintractable headache, unspecified headache type - Plan: CT Head Wo Contrast  Right elbow pain   Patient's headache seems to respond well to Tylenol and Claritin, headache could be related to chronic tension headaches and or a sinus type headache.  Due to her persistent headaches, I think finally getting a CT of the brain is a good plan.  Patient advised that if any alarm symptoms develop such as losses of visual field, floaters in eyes, re-resurgence of eye pain, facial droop, speech difficulty, one-sided extremity weakness, sharp thunderclap style pain and had to call 911 right away and go to emergency room.  Right elbow pain seems to improve with use of compression stocking and Tylenol as well.  Patient advised  she can also try topical rub like Aspercreme, BenGay or Biofreeze for pain relief.  Advised patient that I suspect most likely her right elbow pain is arthritis.   I discussed the assessment and treatment plan with the patient. The patient was provided an opportunity to ask questions and all were answered. The patient agreed with the plan and demonstrated an understanding of the instructions.   The patient was advised to call back or seek an in-person evaluation if the symptoms worsen or if the condition fails to improve as anticipated.  15 minutes spent non face to face via phone with patient for this visit.   Jodelle Green, FNP

## 2018-10-27 NOTE — Telephone Encounter (Signed)
Pt had a Telephone visit with NP Philis Nettle today

## 2018-10-28 ENCOUNTER — Ambulatory Visit: Payer: Self-pay

## 2018-10-28 NOTE — Telephone Encounter (Signed)
Copied from [place] [id number]. Topic: General - Other >> Oct 28, 2018  1:32 PM Sheran Luz wrote: Reason for CRM: See NT from today- Patient's daughter is requesting call back from CMA rather than contacting patient directly. Unable to addend NT encounter.

## 2018-10-28 NOTE — Telephone Encounter (Signed)
Pt is at a urgent care appt right now being seen, I informed her if she needs anything after give [place] a call.  Linda Biehn,cma

## 2018-10-28 NOTE — Telephone Encounter (Signed)
Patient called and she says the headaches are worse at night and the Tylenol is not working. She says she took Ibuprofen at 0500 this morning and that seemed to ease it some. She's asking what is causing the headaches and is there something else for her to do. I advised I will send this to Lauren, since she was evaluated by her on yesterday, and someone will call back with her recommendation.  Reason for Disposition . [1] Caller has NON-URGENT question (includes prescribed medication questions) AND [2] triager unable to answer  Answer Assessment - Initial Assessment Questions 1. MAIN CONCERN OR SYMPTOM:  "What is your main concern right now?" "What question do you have?" "What's the main symptom you're worried about?" (e.g., breathing difficulty, cough, fever. pain)     Headache not getting better, worse at night 2. ONSET: "When did the headache start?"     N/A 3. BETTER-SAME-WORSE: "Are you getting better, staying the same, or getting worse compared to how you felt at your last visit to the doctor (most recent medical visit)"?     Not getting better 4. VISIT DATE: "When were you seen?" (Date)     10/27/18 5. VISIT DOCTOR: "What is the name of the doctor taking care of you now?"     Philis Nettle, FNP 6. VISIT DIAGNOSIS:  "What was the main symptom or problem that you were seen for?" "Were you given a diagnosis?"     N/A 7. VISIT MEDICATIONS: "Did the physician order any new medicines for you to use?" If yes: "Have you filled the prescription and started taking the medicine?"     Use Claritin and Tylenol 8. NEXT APPOINTMENT: "Have you scheduled a follow-up appointment with your doctor?"     N/A 9. PAIN: "Is there any pain?" If so, ask: "How bad is it?"  (Scale 1-10; or mild, moderate, severe)     Real bad at night, can't sleep 10. FEVER: "Do you have a fever?" If so, ask: "What is it, how was it measured  and when did it start?"      N/A 11. OTHER SYMPTOMS: "Do you have any other symptoms?"      N/A  Protocols used: RECENT MEDICAL VISIT FOR ILLNESS FOLLOW-UP CALL-A-AH

## 2018-10-28 NOTE — Telephone Encounter (Signed)
Call pt Please make her an appt with me today at 4:15. Can she do doxy?

## 2018-10-28 NOTE — Telephone Encounter (Signed)
Glad she is there.   Thank you.

## 2018-10-30 NOTE — Telephone Encounter (Signed)
Spoke with pt and she stated that she did go to urgent care the other day but she is still having headaches on and off. Pt stated that she sees neurology on Monday and has a CT scan on Tuesday.

## 2018-10-30 NOTE — Telephone Encounter (Signed)
Call pt to circle back  Was she seen in urgent care?  Does she need f/u for next week?

## 2018-11-02 ENCOUNTER — Ambulatory Visit: Payer: Self-pay

## 2018-11-02 NOTE — Telephone Encounter (Signed)
I have tried to call patient twice & no VM picks up to leave message.

## 2018-11-02 NOTE — Telephone Encounter (Signed)
Call pt How is she doing? Please confirm that she sees neurology today.  Her CT Head appears tomorrow  How is her BP ?

## 2018-11-02 NOTE — Telephone Encounter (Signed)
Incoming call from Patient, returning call. Patient states she plans to  go to her appointments on today and tomorrow at the [facility].  Patient states that her blood pressure was   146/ 64 the last time it was taken.  Patient states that her headaches are slowing down.

## 2018-11-03 ENCOUNTER — Ambulatory Visit: Admission: RE | Admit: 2018-11-03 | Payer: Medicare HMO | Source: Ambulatory Visit

## 2018-11-04 ENCOUNTER — Ambulatory Visit: Payer: Medicare HMO | Attending: Family

## 2018-11-04 ENCOUNTER — Other Ambulatory Visit: Payer: Self-pay

## 2018-11-04 MED ORDER — PANTOPRAZOLE SODIUM 20 MG PO TBEC: 20.0000 mg | DELAYED_RELEASE_TABLET | Freq: Every day | ORAL | 1 refills | Status: DC

## 2018-11-04 MED ORDER — NORTRIPTYLINE HCL 10 MG PO CAPS: ORAL_CAPSULE | ORAL | 0 refills | Status: DC

## 2018-11-04 NOTE — Telephone Encounter (Signed)
I spoke with patient & advised her of below. She stated that she stopped taking the protonix due to her getting head aches & was told this may cause HA's. Patient was willing to try something else such as TUMS instead. She mainly has acid reflux at night & does know that eating certain foods trigger this. I asked her to call back if she did wish for Joycelyn Schmid to send in the lower 20 mg dose of protonix.

## 2018-11-04 NOTE — Addendum Note (Signed)
Addended by: [name] on: 11/04/2018 12:49 PM   Modules accepted: Orders

## 2018-11-04 NOTE — Telephone Encounter (Signed)
Call patient She is on high-dose Protonix.  I typically prescribe 20 mg.  Is she agreeable to this?  I went ahead and sent in to mail order pharmacy.   Please also ensure that she does not have any trouble swallowing or any history of Barrett's esophagus to her knowledge. I  Not see that she is ever had an upper scope.  Please offer appt for GERD if she has any concerns.   Please also advise her of the below in regards to long-term use of Protonix and the drugs and its drug class  Long term use beyond 3 months of proton pump inhibitors , also called PPI's, is associated with malabsorption of vitamins, chronic kidney disease, fracture risk, and diarrheal illnesses. PPI's include Nexium, Prilosec, Protonix, Dexilant, and Prevacid.   I generally recommend trying to control acid reflux with lifestyle modifications including avoiding trigger foods, not eating 2 hours prior to bedtime. You may use histamine 2 blockers daily to twice daily ( this Pepcid) and then when symptoms flare, start back on PPI for short course.   Of note, we will need to do an endoscopy ( upper GI) to evaluate your esophagus, stomach in the future if acid reflux persists are you develop red flag symptoms: trouble swallowing, hoarseness, chronic cough, unexplained weight loss.

## 2018-11-10 ENCOUNTER — Other Ambulatory Visit: Payer: Self-pay

## 2018-11-10 ENCOUNTER — Ambulatory Visit
Admission: RE | Admit: 2018-11-10 | Discharge: 2018-11-10 | Disposition: A | Discharge status: Home / Self Care | Payer: Medicare HMO | Source: Ambulatory Visit | Attending: Family Medicine | Admitting: Family Medicine

## 2018-11-10 ENCOUNTER — Telehealth: Payer: Self-pay | Admitting: Family

## 2018-11-10 DIAGNOSIS — R51 Headache: Secondary | ICD-10-CM | POA: Diagnosis not present

## 2018-11-10 DIAGNOSIS — G8929 Other chronic pain: Secondary | ICD-10-CM

## 2018-11-10 DIAGNOSIS — R519 Headache, unspecified: Secondary | ICD-10-CM

## 2018-11-10 NOTE — Telephone Encounter (Signed)
Copied from [place] [id number]. Topic: Quick Communication - Rx Refill/Question >> Nov 10, 2018  9:32 AM Alanda Slim E wrote: Medication: pantoprazole (PROTONIX) 20 MG tablet  Pt has stopped taking the above medication due to it causing her to have headaches but now that she has stopped taking it, she is experiencing stomach pains especially when eating / Pt wants to know if there is something she can take for her stomach issues that will not cause headaches / please advise

## 2018-11-11 ENCOUNTER — Ambulatory Visit (INDEPENDENT_AMBULATORY_CARE_PROVIDER_SITE_OTHER): Payer: Medicare HMO | Admitting: Internal Medicine

## 2018-11-11 DIAGNOSIS — H409 Unspecified glaucoma: Secondary | ICD-10-CM

## 2018-11-11 DIAGNOSIS — K219 Gastro-esophageal reflux disease without esophagitis: Secondary | ICD-10-CM | POA: Diagnosis not present

## 2018-11-11 DIAGNOSIS — E1165 Type 2 diabetes mellitus with hyperglycemia: Secondary | ICD-10-CM

## 2018-11-11 DIAGNOSIS — R109 Unspecified abdominal pain: Secondary | ICD-10-CM | POA: Diagnosis not present

## 2018-11-11 NOTE — Telephone Encounter (Signed)
FYI

## 2018-11-11 NOTE — Progress Notes (Signed)
telephone Note  I connected with Tiffany Bailey   on 11/11/18 at  2:07 PM EDT by a telephone and verified that I am speaking with the correct person using two identifiers.  Location patient: home Persons participating in the virtual visit: patient, provider  I discussed the limitations of evaluation and management by telemedicine and the availability of in person appointments. The patient expressed understanding and agreed to proceed.   HPI: 1. Generalized abdominal pain intermittent x 2 weeks once she stopped her protonix 20 mg qd but she does not want to take this due to c/w side effect of h/a. She reports her abdomen was hurting and swelling from the inside and bubbling with gas. Pain was 5-10/10 last noted abdominal pain yesterday. She reports she has been having gas pains in her abdomen as well.  Her last bowel movement was last week.  She reports resolved abdominal pain today but the days when she had abdominal pain she had reduced oral intake and nausea and vomiting. She vomited 1x last week but once she threw up she felt better. She was able to eat ham, eggs, coffee, juice today and tolerated this ok. She is able to keep her medications down  2. H/o GERD she decided to take pepcid 10 mg taking 1x per day  3. Left eye pain and redness and redness on the eyelid with a h/o glaucoma left eye will refer back to Dr. Peter Garter eye  4. DM 2 last A1C 10.0 07/06/18 f/u [facility] Toujeo U 300 5 units qhs per endocrine   ROS: See pertinent positives and negatives per HPI. General: denies fever   Past Medical History:  Diagnosis Date  . Anemia   . Anginal pain ([place])   . Arthritis   . Cancer (HCC)    BREAST  . CKD (chronic kidney disease), stage III ([place])   . Coronary artery disease    S/P CABG and multiple PCI's  . Diabetes mellitus without complication ([place])   . Edema   . Failure to thrive in adult   . GERD (gastroesophageal reflux disease)   . History of breast cancer   . HOH (hard  of hearing)   . Hyperlipidemia   . Hypertension   . Myocardial infarction ([place])   . Palpitations     Past Surgical History:  Procedure Laterality Date  . ABDOMINAL HYSTERECTOMY    . ABOVE KNEE LEG AMPUTATION Left 2013  . ANTERIOR VITRECTOMY Left 11/16/2015   Procedure: ANTERIOR VITRECTOMY;  Surgeon: Eulogio Bear, MD;  Location: [facility] OR[facility];  Service: Ophthalmology;  Laterality: Left;  . BLADDER SURGERY    . CATARACT EXTRACTION W/PHACO Left 11/16/2015   Procedure: CATARACT EXTRACTION PHACO AND INTRAOCULAR LENS PLACEMENT (IOC);  Surgeon: Eulogio Bear, MD;  Location: [facility];  Service: Ophthalmology;  Laterality: Left;  Lot # [id number] H [place]; 01:22.8 AP%:12.6 CDE: 10.46  . CATARACT EXTRACTION W/PHACO Right 08/22/2016   Procedure: CATARACT EXTRACTION PHACO AND INTRAOCULAR LENS PLACEMENT (IOC);  Surgeon: Eulogio Bear, MD;  Location: [facility] ORS;  Service: Ophthalmology;  Laterality: Right;  Lot # [id number] H [place]: 00:52.1 AP%:8.7 CDE: 4.99   . CORONARY ANGIOPLASTY     STENT  . CORONARY ARTERY BYPASS GRAFT    . MASTECTOMY    . TUMOR REMOVAL     ABDOMINAL    Family History  Problem Relation Age of Onset  . Sudden death Mother   . Arthritis Father   . Stroke Father   . Breast cancer  Sister   . Diabetes Grandchild     SOCIAL HX: lives at home    Current Outpatient Medications:  .  famotidine (PEPCID) 10 MG tablet, Take 10 mg by mouth 2 (two) times daily., Disp: , Rfl:  .  Insulin Glargine, 1 Unit Dial, (TOUJEO SOLOSTAR) 300 UNIT/ML SOPN, Inject into the skin. U-300 5 units qhs per Dr. Gabriel Carina, Disp: , Rfl:  .  acetaminophen (TYLENOL) 500 MG tablet, Take 1 tablet (500 mg total) by mouth every 6 (six) hours as needed., Disp: 30 tablet, Rfl: 0 .  amLODipine (NORVASC) 10 MG tablet, Take 1 tablet (10 mg total) by mouth daily., Disp: 90 tablet, Rfl: 1 .  aspirin EC 81 MG tablet, Take 1 tablet (81 mg total) by mouth daily., Disp: 30 tablet, Rfl: 5 .  atorvastatin (LIPITOR) 40 MG tablet,  Take 1 tablet (40 mg total) by mouth daily., Disp: 90 tablet, Rfl: 1 .  blood glucose meter kit and supplies KIT, Dispense based on patient and insurance preference. Use up to four times daily as directed. (FOR ICD-9 250.00, 250.01)., Disp: 1 each, Rfl: 0 .  clopidogrel (PLAVIX) 75 MG tablet, Take 1 tablet (75 mg total) by mouth daily., Disp: 90 tablet, Rfl: 3 .  cyclobenzaprine (FLEXERIL) 5 MG tablet, Take 1 tablet (5 mg total) by mouth at bedtime as needed for muscle spasms., Disp: 30 tablet, Rfl: 0 .  ezetimibe (ZETIA) 10 MG tablet, Take 1 tablet (10 mg total) by mouth daily., Disp: 90 tablet, Rfl: 3 .  fluticasone (FLONASE) 50 MCG/ACT nasal spray, Place 1 spray into both nostrils daily., Disp: 16 g, Rfl: 5 .  furosemide (LASIX) 20 MG tablet, Take 1 tablet (20 mg total) by mouth daily for 3 days., Disp: 3 tablet, Rfl: 0 .  glucose blood (ONE TOUCH TEST STRIPS) test strip, Use to test blood sugar twice daily, Disp: 200 each, Rfl: 3 .  Insulin Pen Needle (BD PEN NEEDLE NANO U/F) 32G X 4 MM MISC, USE 1 DAILY, Disp: 100 each, Rfl: 12 .  levocetirizine (XYZAL) 5 MG tablet, Take 1 tablet (5 mg total) by mouth every evening., Disp: 30 tablet, Rfl: 1 .  loratadine (CLARITIN) 10 MG tablet, Take 1 tablet (10 mg total) by mouth daily., Disp: 30 tablet, Rfl: 2 .  metoprolol tartrate (LOPRESSOR) 25 MG tablet, Take 1 tablet (25 mg total) by mouth 2 (two) times daily., Disp: 180 tablet, Rfl: 0 .  nitroGLYCERIN (NITROSTAT) 0.4 MG SL tablet, Place 1 tablet (0.4 mg total) under the tongue every 5 (five) minutes as needed for chest pain., Disp: 25 tablet, Rfl: 4 .  nortriptyline (PAMELOR) 10 MG capsule, Take one capsule daily at bedtime and increase to two tablets daily at bedtime after one week., Disp: 60 capsule, Rfl: 0 .  omega-3 acid ethyl esters (LOVAZA) 1 g capsule, Take by mouth daily., Disp: , Rfl:  .  ONE TOUCH LANCETS MISC, Use as directed 2 times per day, Disp: 200 each, Rfl: 3  EXAM:  VITALS per  patient if applicable:  GENERAL: alert, oriented, appears well and in no acute distress  PSYCH/NEURO: pleasant and cooperative, no obvious depression or anxiety, speech and thought processing grossly intact  ASSESSMENT AND PLAN:  Discussed the following assessment and plan:  Abdominal pain (ddx constipation, r/o pancreatitis vs other I.e indigestion, diabetes complications I.e DKA) - Plan: Comprehensive metabolic panel, CBC w/Diff, Lipase, UA -pt declines abdominal imaging for now but if returns consider CT ab/pelvis with contrast  -  pt to call back if ab pain returns and consider above  Type 2 diabetes mellitus A1C 10.0 07/06/18- Plan: Urinalysis, Routine w reflex microscopic, Microalbumin / creatinine urine ratio, Hemoglobin A1c, Lipid panel -f/u [facility] will CC labs to them   -on Toujeo U 300 5 units qhs, Metformin 500 XR 1000 mg qam  Glaucoma of left eye, unspecified glaucoma type - Plan: Ambulatory referral to Ophthalmology Referred to Dr. Edison Pace AE urgently given pain and redness of left eye and h/o glaucoma   GERD -pt stopped protonix 20 mg and no longer wants to take this and will try pepcid 10 mg bid    I discussed the assessment and treatment plan with the patient. The patient was provided an opportunity to ask questions and all were answered. The patient agreed with the plan and demonstrated an understanding of the instructions.   The patient was advised to call back or seek an in-person evaluation if the symptoms worsen or if the condition fails to improve as anticipated.  Time spent 25 minutes  Delorise Jackson, MD

## 2018-11-13 NOTE — Telephone Encounter (Signed)
Seen 11/11/18.

## 2018-11-16 ENCOUNTER — Other Ambulatory Visit: Payer: Medicare HMO

## 2018-11-17 ENCOUNTER — Other Ambulatory Visit: Payer: Self-pay

## 2018-11-17 ENCOUNTER — Ambulatory Visit: Payer: Medicare HMO | Attending: Family

## 2018-11-17 ENCOUNTER — Telehealth: Payer: Self-pay

## 2018-11-17 VITALS — BP 143/47 | HR 64

## 2018-11-17 DIAGNOSIS — M6281 Muscle weakness (generalized): Secondary | ICD-10-CM | POA: Insufficient documentation

## 2018-11-17 DIAGNOSIS — Z89612 Acquired absence of left leg above knee: Secondary | ICD-10-CM | POA: Diagnosis present

## 2018-11-17 DIAGNOSIS — R2689 Other abnormalities of gait and mobility: Secondary | ICD-10-CM | POA: Diagnosis not present

## 2018-11-17 DIAGNOSIS — R269 Unspecified abnormalities of gait and mobility: Secondary | ICD-10-CM | POA: Diagnosis present

## 2018-11-17 NOTE — Patient Instructions (Signed)
Access Code: [email or web address]  URL: [email or web address]  Date: 11/17/2018  Prepared by: Lieutenant Diego   Exercises Seated Long Arc Quad - 10 reps - 2 sets - 1x daily - 7x weekly  (R leg only) Seated March - 10 reps - 2 sets - 1x daily - 7x weekly Prone Hip Extension with Residual Limb (AKA) - 10 reps - 2 sets - 1x daily - 7x weekly Sidelying Hip Abduction with Flexion and Extension (AKA) - 10 reps - 2 sets - 1x daily - 7x weekly

## 2018-11-17 NOTE — Telephone Encounter (Signed)

## 2018-11-17 NOTE — Therapy (Signed)
[facility] [address] [place], [place], [address] Phone: [phone]   Fax:  [phone]  Physical Therapy Evaluation  Patient Details  Name: Tiffany Bailey MRN: [id number] Date of Birth: 04/17/1928 No data recorded  Encounter Date: 11/17/2018  PT End of Session - 11/17/18 1112    Visit Number  1    Number of Visits  16    Date for PT Re-Evaluation  01/12/19    Authorization Type  medicare aetna    Authorization - Visit Number  1    Authorization - Number of Visits  10    PT Start Time  1111    PT Stop Time  1204    PT Time Calculation (min)  53 min    Equipment Utilized During Treatment  Gait belt    Activity Tolerance  Patient tolerated treatment well;Patient limited by fatigue       Past Medical History:  Diagnosis Date  . Anemia   . Anginal pain ([place])   . Arthritis   . Cancer (HCC)    BREAST  . CKD (chronic kidney disease), stage III ([place])   . Coronary artery disease    S/P CABG and multiple PCI's  . Diabetes mellitus without complication ([place])   . Edema   . Failure to thrive in adult   . GERD (gastroesophageal reflux disease)   . History of breast cancer   . HOH (hard of hearing)   . Hyperlipidemia   . Hypertension   . Myocardial infarction ([place])   . Palpitations     Past Surgical History:  Procedure Laterality Date  . ABDOMINAL HYSTERECTOMY    . ABOVE KNEE LEG AMPUTATION Left 2013  . ANTERIOR VITRECTOMY Left 11/16/2015   Procedure: ANTERIOR VITRECTOMY;  Surgeon: Eulogio Bear, MD;  Location: [facility] ORS;  Service: Ophthalmology;  Laterality: Left;  . BLADDER SURGERY    . CATARACT EXTRACTION W/PHACO Left 11/16/2015   Procedure: CATARACT EXTRACTION PHACO AND INTRAOCULAR LENS PLACEMENT (IOC);  Surgeon: Eulogio Bear, MD;  Location: [facility] OR[facility];  Service: Ophthalmology;  Laterality: Left;  Lot # [id number] H [place]; 01:22.8 AP%:12.6 CDE: 10.46  . CATARACT EXTRACTION W/PHACO Right 08/22/2016   Procedure: CATARACT  EXTRACTION PHACO AND INTRAOCULAR LENS PLACEMENT (IOC);  Surgeon: Eulogio Bear, MD;  Location: [facility] ORS;  Service: Ophthalmology;  Laterality: Right;  Lot # [id number] H [place]: 00:52.1 AP%:8.7 CDE: 4.99   . CORONARY ANGIOPLASTY     STENT  . CORONARY ARTERY BYPASS GRAFT    . MASTECTOMY    . TUMOR REMOVAL     ABDOMINAL    Vitals:   11/17/18 1119  BP: (!) 143/47  Pulse: 64  SpO2: 100%       11/17/18 1112  Home Living  Family/patient expects to be discharged to: Private residence  [place] (grandson (62))  Available Help at Discharge Family;Friend(s)  Type of [place] entrance  Home Layout One level  Bathroom Shower/Tub Tub/shower unit  Corporate treasurer Yes  Home Equipment [place] - 2 wheels;Wheelchair - manual;BSC  Prior Function  Level of Independence Needs assistance  Gait / Transfers Assistance Needed  (stand pivot transfers/squat pivot transfers)  ADL's / [place] Pt able to cook, clean, do dishes, drive, run errands with supervision   Comments self proprels WC  Communication  Communication HOH  Pain Assessment  Pain Assessment No/denies pain  Cognition  Arousal/Alertness Awake/alert  Behavior During  Therapy WFL for tasks assessed/performed  Overall Cognitive Status Within Functional Limits for tasks assessed  PT - End of Session  Equipment Utilized During Treatment Gait belt  Activity Tolerance Patient tolerated treatment well;Patient limited by fatigue    OBJECTIVE  Posture Pt seated posture WFLs. Difficulty with upright posture in standing with AKA prosthetic donned. Use of RW to achieve.  Gait Pt demonstrated ambulation with AKA prosthetic donned, CGA, and RW. Exhibited step to gait pattern, difficulty with prosthetic control/delayed prosthetic knee extension in stance, fatigued quickly.   Strength R/L 5/4 Hip flexion 5/5 Hip extension (unable to don/doff  prosthetic this session due to time constraints but able to perform hip flexion and extension in sidelying with PT holding prosthetic parallel  4+/4 Hip abduction (seated) 4+/4 Hip adduction (seated) 4+/NA Knee extension 5/NA Knee flexion 5/NA Ankle Plantarflexion 5/5NAAnkle Dorsiflexion  FUNCTIONAL OUTCOME MEASURES   Results Comments              TUG 90 seconds   5TSTS 18.5seconds with use of UE, difficulty standing fully upright     Balance assessment: Standing feet apart: mild sway 20secs,  Standing L foot forward: mod sway, 10secs pt expressed fear of falling   Objective measurements completed on examination: See above findings.    TREATMENT: Medbridge:  Access Code: [email or web address] PT and pt reviewed HEP instructed in LAQ on R x5, seated marching x5 bilaterally, prone lying and sidelying hip extension on L with PT assist x5  Clinical Impression: Pt is a pleasant 85 year-old female referred for gait training with AKA prosthetic. PT examination revealed step to gait pattern while ambulating, RW and CGA needed to maintain safety. Pt also presented with limitations in strength, endurance, balance, and functional activities. Outcome measures utilized indicated pt is at increased risk of falls. The ptt will benefit from skilled PT services to address deficits in balance, gait, and functional activities as well as decrease risk for falls.      PT Education - 11/17/18 1212    Education Details  HEP, POC, PT role, ambulation    Person(s) Educated  Patient    Methods  Explanation;Handout    Comprehension  Verbalized understanding       PT Short Term Goals - 11/17/18 1229      PT SHORT TERM GOAL #1   Title  Pt will decrease TUG score to less than 60seconds to indicate improved ambulation tolerance/endurance and decrease risk of falls.    Baseline  90sec 11/17/2018    Time  4    Period  Weeks    Status  New    Target Date  12/15/18        PT Long Term Goals - 11/17/18 1230       PT LONG TERM GOAL #1   Title  Pt will decrease TUG score to less than 45seconds to indicate improved ambulation tolerance/endurance and decrease risk of falls.    Baseline  90sec 11/17/2018    Time  8    Period  Weeks    Status  New    Target Date  01/12/19      PT LONG TERM GOAL #2   Title  Pt will be independent with HEP in order to improve strength and balance in order to decrease fall risk and improve function at home.    Baseline  initial HEP administered 11/17/2018    Time  8    Period  Weeks    Status  New  Target Date  01/12/19      PT LONG TERM GOAL #3   Title  Pt will demonstrate LE strength MMT grades of 5/5 to indicate an improved ability to perform functional activities.    Baseline  see PT evalulation for details 11/17/2018    Time  8    Period  Weeks    Status  New    Target Date  01/12/19             Plan - 11/17/18 1226    Clinical Impression Statement  Pt is a pleasant 85 year-old female referred for gait training with AKA prosthetic. PT examination revealed step to gait pattern while ambulating, RW and CGA needed to maintain safety. Pt also presented with limitations in strength, endurance, balance, and functional activities. Outcome measures utilized indicated pt is at increased risk of falls. The ptt will benefit from skilled PT services to address deficits in balance, gait, and functional activities as well as decrease risk for falls.    Personal Factors and Comorbidities  Behavior Pattern;Comorbidity 3+;Fitness;Time since onset of injury/illness/exacerbation    Comorbidities  HTN, HLD, GERD, hx of breast cancer, DM, CKD III, history of MI    Examination-Activity Limitations  Stairs;Transfers;Squat;Stand;Locomotion Level;Bed Mobility    Examination-Participation Restrictions  Community Activity;Shop;Meal Prep;Laundry    Stability/Clinical Decision Making  Stable/Uncomplicated    Clinical Decision Making  Low    Rehab Potential  Good    PT Frequency  2x /  week    PT Duration  8 weeks    PT Treatment/Interventions  ADLs/Self Care Home Management;Aquatic Therapy;Moist Heat;Gait training;DME Instruction;Functional mobility training;Stair training;Therapeutic activities;Therapeutic exercise;Balance training;Neuromuscular re-education;Patient/family education;Prosthetic Training;Passive range of motion;Dry needling;Energy conservation;Taping;Manual techniques    PT Next Visit Plan  continue strengthening to add to HEP, gait training, balance training    PT Home Exercise Plan  see medbridge access code in note; LAQ, seated marching, supine/sidelying hip extension, use of RW to aide in transfers    Consulted and Agree with Plan of Care  Patient;Family member/caregiver    Family Member Consulted  daughter, [name]       Patient will benefit from skilled therapeutic intervention in order to improve the following deficits and impairments:  Abnormal gait, Decreased activity tolerance, Decreased endurance, Decreased strength, Pain, Difficulty walking, Decreased mobility, Decreased balance, Decreased knowledge of use of DME  Visit Diagnosis: Other abnormalities of gait and mobility  Muscle weakness (generalized)     Problem List Patient Active Problem List   Diagnosis Date Noted  . Hx of AKA (above knee amputation), left ([place]) 08/26/2018  . Left arm swelling 07/01/2018  . Carotid stenosis 12/19/2017  . Hand cramp 10/17/2017  . Acute non-recurrent maxillary sinusitis 10/17/2017  . Skin lesion 09/29/2017  . Diarrhea 04/23/2017  . Weight loss 09/12/2016  . CKD stage 3 due to type 2 diabetes mellitus ([place]) 08/14/2016  . PAD (peripheral artery disease) ([place]) 08/14/2016  . DM (diabetes mellitus) ([place]) 03/25/2016  . Anemia 03/25/2016  . Chronic tension-type headache, not intractable 03/25/2016  . Essential hypertension 03/25/2016  . CAD (coronary artery disease), native coronary artery 03/25/2016  . GERD (gastroesophageal reflux disease) 03/25/2016   . Hyperlipidemia 03/25/2016  . History of breast cancer 03/25/2016    Lieutenant Diego PT, DPT 12:35 PM,11/17/18 [phone]  [facility] [address] [place], [place], [address] Phone: [phone]   Fax:  [phone]  Name: Tiffany Bailey MRN: [id number] Date of Birth: 1927-10-02

## 2018-11-18 LAB — HM DIABETES EYE EXAM

## 2018-11-19 ENCOUNTER — Encounter: Payer: Medicare HMO | Admitting: Physical Therapy
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
## 2018-11-23 ENCOUNTER — Encounter: Payer: Medicare HMO | Admitting: Physical Therapy

## 2018-11-23 ENCOUNTER — Ambulatory Visit: Payer: Medicare HMO | Admitting: Physical Therapy

## 2018-11-24 ENCOUNTER — Telehealth (INDEPENDENT_AMBULATORY_CARE_PROVIDER_SITE_OTHER): Payer: Medicare HMO | Admitting: Cardiovascular Disease

## 2018-11-24 ENCOUNTER — Other Ambulatory Visit: Payer: Self-pay

## 2018-11-24 DIAGNOSIS — E1122 Type 2 diabetes mellitus with diabetic chronic kidney disease: Secondary | ICD-10-CM

## 2018-11-24 DIAGNOSIS — I739 Peripheral vascular disease, unspecified: Secondary | ICD-10-CM

## 2018-11-24 DIAGNOSIS — Z794 Long term (current) use of insulin: Secondary | ICD-10-CM

## 2018-11-24 DIAGNOSIS — R55 Syncope and collapse: Secondary | ICD-10-CM

## 2018-11-24 DIAGNOSIS — I25118 Atherosclerotic heart disease of native coronary artery with other forms of angina pectoris: Secondary | ICD-10-CM

## 2018-11-24 DIAGNOSIS — N183 Chronic kidney disease, stage 3 unspecified: Secondary | ICD-10-CM

## 2018-11-24 DIAGNOSIS — E1159 Type 2 diabetes mellitus with other circulatory complications: Secondary | ICD-10-CM

## 2018-11-24 DIAGNOSIS — R0989 Other specified symptoms and signs involving the circulatory and respiratory systems: Secondary | ICD-10-CM | POA: Diagnosis not present

## 2018-11-24 DIAGNOSIS — I10 Essential (primary) hypertension: Secondary | ICD-10-CM

## 2018-11-24 DIAGNOSIS — I6529 Occlusion and stenosis of unspecified carotid artery: Secondary | ICD-10-CM

## 2018-11-24 MED ORDER — CLOPIDOGREL BISULFATE 75 MG PO TABS: 75.0000 mg | ORAL_TABLET | Freq: Every day | ORAL | 3 refills | Status: DC

## 2018-11-24 MED ORDER — AMLODIPINE BESYLATE 10 MG PO TABS: 10.0000 mg | ORAL_TABLET | Freq: Every day | ORAL | 3 refills | Status: DC

## 2018-11-24 NOTE — Patient Instructions (Addendum)

## 2018-11-25 ENCOUNTER — Encounter: Payer: Medicare HMO | Admitting: Physical Therapy

## 2018-11-26 ENCOUNTER — Other Ambulatory Visit: Payer: Self-pay

## 2018-11-26 ENCOUNTER — Ambulatory Visit: Payer: Medicare HMO | Admitting: Physical Therapy

## 2018-11-26 DIAGNOSIS — M6281 Muscle weakness (generalized): Secondary | ICD-10-CM

## 2018-11-26 DIAGNOSIS — R2689 Other abnormalities of gait and mobility: Secondary | ICD-10-CM | POA: Diagnosis not present

## 2018-11-26 DIAGNOSIS — R269 Unspecified abnormalities of gait and mobility: Secondary | ICD-10-CM

## 2018-11-26 DIAGNOSIS — Z89612 Acquired absence of left leg above knee: Secondary | ICD-10-CM

## 2018-11-29 NOTE — Therapy (Signed)
[facility] [facility] [address] [place], [place], [address] Phone: [phone]   Fax:  [phone]  Physical Therapy Treatment  Patient Details  Name: Tiffany Bailey MRN: [id number] Date of Birth: 1928-04-17 No data recorded  Encounter Date: 11/26/2018  PT End of Session - 11/29/18 1941    Visit Number  2    Number of Visits  16    Date for PT Re-Evaluation  01/12/19    Authorization Type  medicare aetna    Authorization - Visit Number  2    Authorization - Number of Visits  10    PT Start Time  1058    PT Stop Time  1203    PT Time Calculation (min)  65 min    Equipment Utilized During Treatment  Gait belt    Activity Tolerance  Patient tolerated treatment well;Patient limited by fatigue    Behavior During Therapy  WFL for tasks assessed/performed       Past Medical History:  Diagnosis Date  . Anemia   . Anginal pain ([place])   . Arthritis   . Cancer (HCC)    BREAST  . CKD (chronic kidney disease), stage III ([place])   . Coronary artery disease    S/P CABG and multiple PCI's  . Diabetes mellitus without complication ([place])   . Edema   . Failure to thrive in adult   . GERD (gastroesophageal reflux disease)   . History of breast cancer   . HOH (hard of hearing)   . Hyperlipidemia   . Hypertension   . Myocardial infarction ([place])   . Palpitations     Past Surgical History:  Procedure Laterality Date  . ABDOMINAL HYSTERECTOMY    . ABOVE KNEE LEG AMPUTATION Left 2013  . ANTERIOR VITRECTOMY Left 11/16/2015   Procedure: ANTERIOR VITRECTOMY;  Surgeon: Eulogio Bear, MD;  Location: [facility] ORS;  Service: Ophthalmology;  Laterality: Left;  . BLADDER SURGERY    . CATARACT EXTRACTION W/PHACO Left 11/16/2015   Procedure: CATARACT EXTRACTION PHACO AND INTRAOCULAR LENS PLACEMENT (IOC);  Surgeon: Eulogio Bear, MD;  Location: [facility] ORS;  Service: Ophthalmology;  Laterality: Left;  Lot # [id number] H [place]; 01:22.8 AP%:12.6 CDE: 10.46  .  CATARACT EXTRACTION W/PHACO Right 08/22/2016   Procedure: CATARACT EXTRACTION PHACO AND INTRAOCULAR LENS PLACEMENT (IOC);  Surgeon: Eulogio Bear, MD;  Location: [facility] ORS;  Service: Ophthalmology;  Laterality: Right;  Lot # [id number] H [place]: 00:52.1 AP%:8.7 CDE: 4.99   . CORONARY ANGIOPLASTY     STENT  . CORONARY ARTERY BYPASS GRAFT    . MASTECTOMY    . TUMOR REMOVAL     ABDOMINAL    There were no vitals filed for this visit.  Subjective Assessment - 11/29/18 1936    Subjective  Pt. apologized for missing last 2 PT appts. but had transportation issues.  Pt. is not walking with prosthetic leg at home.  Pt. is planning to see Staci Righter in a couple weeks.  Pt. reports discomfort/ soreness in residual limb.    Patient is accompained by:  Family member    Patient Stated Goals  Improve walking with appropriate assistive device.    Currently in Pain?  Yes    Pain Score  --   not rated.  Difficulty subjectifing   Pain Location  Leg    Pain Orientation  Left    Pain Descriptors / Indicators  Sore         There.ex.:  Reviewed  HEP/ handouts Seated hip marching/ abduction/ adduction with ball 20x each.   Supine manual isometrics of hip (all planes)- 10x 10 sec. Each.   Sit to stands 10x from blue mat table to rollator with cuing to push up/ not pull up on rollator  Gait training:  Ambulate in //-bars with prosthetic leg working on L heel strike/ R step through phase of gait.  Requires B UE assist and benefits from mirror feedback/ verbal cuing (CGA to min. A) Amb. In clinic with use of rollator and CGA from PT working on proper foot placement/ BOS/ step pattern with prosthetic leg.    Reviewed B UE AROM/ strength     PT Short Term Goals - 11/17/18 1229      PT SHORT TERM GOAL #1   Title  Pt will decrease TUG score to less than 60seconds to indicate improved ambulation tolerance/endurance and decrease risk of falls.    Baseline  90sec 11/17/2018    Time  4    Period  Weeks     Status  New    Target Date  12/15/18        PT Long Term Goals - 11/17/18 1230      PT LONG TERM GOAL #1   Title  Pt will decrease TUG score to less than 45seconds to indicate improved ambulation tolerance/endurance and decrease risk of falls.    Baseline  90sec 11/17/2018    Time  8    Period  Weeks    Status  New    Target Date  01/12/19      PT LONG TERM GOAL #2   Title  Pt will be independent with HEP in order to improve strength and balance in order to decrease fall risk and improve function at home.    Baseline  initial HEP administered 11/17/2018    Time  8    Period  Weeks    Status  New    Target Date  01/12/19      PT LONG TERM GOAL #3   Title  Pt will demonstrate LE strength MMT grades of 5/5 to indicate an improved ability to perform functional activities.    Baseline  see PT evalulation for details 11/17/2018    Time  8    Period  Weeks    Status  New    Target Date  01/12/19            Plan - 11/29/18 1942    Clinical Impression Statement  Pt. able to safely stand from w/c into //-bars/ rollator.  Pt. has gapping noted in proximal aspect of prosthetic limb and no ply socks.  Pt. instructed to bring ply socks/ bag next tx. session.  Pt. easily fatigued with standing ther.ex./ walking in clinic and requested several seated rest breaks.  No change to HEP at this time.    Personal Factors and Comorbidities  Behavior Pattern;Comorbidity 3+;Fitness;Time since onset of injury/illness/exacerbation    Comorbidities  HTN, HLD, GERD, hx of breast cancer, DM, CKD III, history of MI    Examination-Activity Limitations  Stairs;Transfers;Squat;Stand;Locomotion Level;Bed Mobility    Examination-Participation Restrictions  Community Activity;Shop;Meal Prep;Laundry    Stability/Clinical Decision Making  Stable/Uncomplicated    Rehab Potential  Good    PT Frequency  2x / week    PT Duration  8 weeks    PT Treatment/Interventions  ADLs/Self Care Home Management;Aquatic  Therapy;Moist Heat;Gait training;DME Instruction;Functional mobility training;Stair training;Therapeutic activities;Therapeutic exercise;Balance training;Neuromuscular re-education;Patient/family education;Prosthetic Training;Passive range of  motion;Dry needling;Energy conservation;Taping;Manual techniques    PT Next Visit Plan  continue strengthening to add to HEP, gait training, balance training    PT Home Exercise Plan  see medbridge access code in note; LAQ, seated marching, supine/sidelying hip extension, use of RW to aide in transfers    Consulted and Agree with Plan of Care  Patient;Family member/caregiver    Family Member Consulted  daughter, [name]       Patient will benefit from skilled therapeutic intervention in order to improve the following deficits and impairments:  Abnormal gait, Decreased activity tolerance, Decreased endurance, Decreased strength, Pain, Difficulty walking, Decreased mobility, Decreased balance, Decreased knowledge of use of DME  Visit Diagnosis: Hx of AKA (above knee amputation), left (HCC)  Muscle weakness (generalized)  Gait difficulty     Problem List Patient Active Problem List   Diagnosis Date Noted  . Hx of AKA (above knee amputation), left ([place]) 08/26/2018  . Left arm swelling 07/01/2018  . Carotid stenosis 12/19/2017  . Hand cramp 10/17/2017  . Acute non-recurrent maxillary sinusitis 10/17/2017  . Skin lesion 09/29/2017  . Diarrhea 04/23/2017  . Weight loss 09/12/2016  . CKD stage 3 due to type 2 diabetes mellitus ([place]) 08/14/2016  . PAD (peripheral artery disease) ([place]) 08/14/2016  . DM (diabetes mellitus) ([place]) 03/25/2016  . Anemia 03/25/2016  . Chronic tension-type headache, not intractable 03/25/2016  . Essential hypertension 03/25/2016  . CAD (coronary artery disease), native coronary artery 03/25/2016  . GERD (gastroesophageal reflux disease) 03/25/2016  . Hyperlipidemia 03/25/2016  . History of breast cancer 03/25/2016    Pura Spice, PT, DPT # [phone] 11/29/2018, 7:47 PM  [facility] [address] [place], [place], [address] Phone: [phone]   Fax:  [phone]  Name: KRISALYN YANKOWSKI MRN: [id number] Date of Birth: May 27, 1928

## 2018-11-30 ENCOUNTER — Ambulatory Visit: Payer: Medicare HMO | Admitting: Physical Therapy

## 2018-11-30 ENCOUNTER — Other Ambulatory Visit: Payer: Self-pay

## 2018-11-30 ENCOUNTER — Encounter: Payer: Medicare HMO | Admitting: Physical Therapy

## 2018-11-30 DIAGNOSIS — R2689 Other abnormalities of gait and mobility: Secondary | ICD-10-CM

## 2018-11-30 DIAGNOSIS — R269 Unspecified abnormalities of gait and mobility: Secondary | ICD-10-CM

## 2018-11-30 DIAGNOSIS — Z89612 Acquired absence of left leg above knee: Secondary | ICD-10-CM

## 2018-11-30 DIAGNOSIS — M6281 Muscle weakness (generalized): Secondary | ICD-10-CM

## 2018-12-01 ENCOUNTER — Encounter: Payer: Self-pay | Admitting: Physical Therapy

## 2018-12-01 ENCOUNTER — Encounter: Payer: Medicare HMO | Admitting: Physical Therapy

## 2018-12-02 ENCOUNTER — Encounter: Payer: Medicare HMO | Admitting: Physical Therapy

## 2018-12-02 NOTE — Therapy (Signed)
[facility] [address] [place], [place], [address] Phone: [phone]   Fax:  [phone]  Physical Therapy Treatment  Patient Details  Name: Tiffany Bailey MRN: [id number] Date of Birth: 03/26/1928 No data recorded  Encounter Date: 11/30/2018  PT End of Session - 12/01/18 1229    Visit Number  3    Number of Visits  16    Date for PT Re-Evaluation  01/12/19    Authorization Type  medicare aetna    Authorization - Visit Number  3    Authorization - Number of Visits  10    PT Start Time  1057    PT Stop Time  1159    PT Time Calculation (min)  62 min    Equipment Utilized During Treatment  Gait belt    Activity Tolerance  Patient tolerated treatment well;Patient limited by fatigue    Behavior During Therapy  WFL for tasks assessed/performed       Past Medical History:  Diagnosis Date  . Anemia   . Anginal pain ([place])   . Arthritis   . Cancer (HCC)    BREAST  . CKD (chronic kidney disease), stage III ([place])   . Coronary artery disease    S/P CABG and multiple PCI's  . Diabetes mellitus without complication ([place])   . Edema   . Failure to thrive in adult   . GERD (gastroesophageal reflux disease)   . History of breast cancer   . HOH (hard of hearing)   . Hyperlipidemia   . Hypertension   . Myocardial infarction ([place])   . Palpitations     Past Surgical History:  Procedure Laterality Date  . ABDOMINAL HYSTERECTOMY    . ABOVE KNEE LEG AMPUTATION Left 2013  . ANTERIOR VITRECTOMY Left 11/16/2015   Procedure: ANTERIOR VITRECTOMY;  Surgeon: Eulogio Bear, MD;  Location: [facility] ORS;  Service: Ophthalmology;  Laterality: Left;  . BLADDER SURGERY    . CATARACT EXTRACTION W/PHACO Left 11/16/2015   Procedure: CATARACT EXTRACTION PHACO AND INTRAOCULAR LENS PLACEMENT (IOC);  Surgeon: Eulogio Bear, MD;  Location: [facility];  Service: Ophthalmology;  Laterality: Left;  Lot # [id number] [place]; 01:22.8 AP%:12.6 CDE: 10.46  .  CATARACT EXTRACTION W/PHACO Right 08/22/2016   Procedure: CATARACT EXTRACTION PHACO AND INTRAOCULAR LENS PLACEMENT (IOC);  Surgeon: Eulogio Bear, MD;  Location: [facility] OR[facility];  Service: Ophthalmology;  Laterality: Right;  Lot # [id number] H [place]: 00:52.1 AP%:8.7 CDE: 4.99   . CORONARY ANGIOPLASTY     STENT  . CORONARY ARTERY BYPASS GRAFT    . MASTECTOMY    . TUMOR REMOVAL     ABDOMINAL    There were no vitals filed for this visit.  Subjective Assessment - 12/01/18 1227    Subjective  Pt. arrived to PT with prosthetic leg donned and brought bag of ply socks/ liners.  PT assist pt. with determining proper amount of ply socks for fit  (4 ply recommended today).    Patient is accompained by:  Family member    Pertinent History  Pt with PMH HTN, HLD, HOH, GERD, history of breast cancer, DM, CKD III, prevoius MI. AKA performed 2013. Pt currently self propels WC, lives with her son and grandson. Reported she is independent in dressing, bird bathes (needs assistance in/out of tub/shower but does do that intermittently), drives. Pt reported she is able to wear her prosthetic all day but it hurts, and rarely does wear it. Pt able to  don/doff independently. Predominantly uses stand pivot transfers without AD.    Patient Stated Goals  Improve walking with appropriate assistive device.    Currently in Pain?  Yes    Pain Score  4     Pain Location  Foot    Pain Orientation  Right    Pain Descriptors / Indicators  Aching;Discomfort    Pain Type  Chronic pain        There.ex.:  Adjustment of ply socks/ gel liner on blue mat table prior to seated/supine there.ex. Seated hip marching/ abduction/ adduction with ball 20x each.   Supine manual isometrics of hip (all planes)- 10x 10 sec. Each.   Sit to stands 10x from blue mat table to rollator with cuing to push up/ not pull up on rollator  Gait training:  Ambulate in //-bars with prosthetic leg working on L heel strike/ R step through phase of gait.   Requires B UE assist and benefits from mirror feedback/ verbal cuing (CGA to min. A) Amb. In clinic with use of rollator and CGA from PT working on proper foot placement/ BOS/ step pattern with prosthetic leg.        PT Short Term Goals - 11/17/18 1229      PT SHORT TERM GOAL #1   Title  Pt will decrease TUG score to less than 60seconds to indicate improved ambulation tolerance/endurance and decrease risk of falls.    Baseline  90sec 11/17/2018    Time  4    Period  Weeks    Status  New    Target Date  12/15/18        PT Long Term Goals - 11/17/18 1230      PT LONG TERM GOAL #1   Title  Pt will decrease TUG score to less than 45seconds to indicate improved ambulation tolerance/endurance and decrease risk of falls.    Baseline  90sec 11/17/2018    Time  8    Period  Weeks    Status  New    Target Date  01/12/19      PT LONG TERM GOAL #2   Title  Pt will be independent with HEP in order to improve strength and balance in order to decrease fall risk and improve function at home.    Baseline  initial HEP administered 11/17/2018    Time  8    Period  Weeks    Status  New    Target Date  01/12/19      PT LONG TERM GOAL #3   Title  Pt will demonstrate LE strength MMT grades of 5/5 to indicate an improved ability to perform functional activities.    Baseline  see PT evalulation for details 11/17/2018    Time  8    Period  Weeks    Status  New    Target Date  01/12/19         Plan - 12/01/18 1230    Clinical Impression Statement  Pt. instructed in proper donning/ doffing of gel liner/ ply socks prior to standing/ walking tasks.  Pt. reports discomfort in L residual limb and benefits from increase ply socks to 4.  Pt. reports persistent R foot/ lower leg discomfort with wt. bearing/ walking tasks.  Pt. easily fatigued when walking around 80 feet but motivated to increase walking endurance/ gait pattern.  No LOB during walking tasks and improved control of L prosthesis but  difficulty flexing L knee during standing to seated position.  No change to HEP.    Personal Factors and Comorbidities  Behavior Pattern;Comorbidity 3+;Fitness;Time since onset of injury/illness/exacerbation    Comorbidities  HTN, HLD, GERD, hx of breast cancer, DM, CKD III, history of MI    Examination-Activity Limitations  Stairs;Transfers;Squat;Stand;Locomotion Level;Bed Mobility    Examination-Participation Restrictions  Community Activity;Shop;Meal Prep;Laundry    Stability/Clinical Decision Making  Stable/Uncomplicated    Clinical Decision Making  Moderate    Rehab Potential  Good    PT Frequency  2x / week    PT Duration  8 weeks    PT Treatment/Interventions  ADLs/Self Care Home Management;Aquatic Therapy;Moist Heat;Gait training;DME Instruction;Functional mobility training;Stair training;Therapeutic activities;Therapeutic exercise;Balance training;Neuromuscular re-education;Patient/family education;Prosthetic Training;Passive range of motion;Dry needling;Energy conservation;Taping;Manual techniques    PT Next Visit Plan  continue strengthening to add to HEP, gait training, balance training    PT Home Exercise Plan  see medbridge access code in note; LAQ, seated marching, supine/sidelying hip extension, use of RW to aide in transfers    Consulted and Agree with Plan of Care  Patient;Family member/caregiver    Family Member Consulted  daughter, [name]       Patient will benefit from skilled therapeutic intervention in order to improve the following deficits and impairments:  Abnormal gait, Decreased activity tolerance, Decreased endurance, Decreased strength, Pain, Difficulty walking, Decreased mobility, Decreased balance, Decreased knowledge of use of DME  Visit Diagnosis: 1. Muscle weakness (generalized)   2. Hx of AKA (above knee amputation), left (HCC)   3. Gait difficulty   4. Other abnormalities of gait and mobility        Problem List Patient Active Problem List    Diagnosis Date Noted  . Hx of AKA (above knee amputation), left ([place]) 08/26/2018  . Left arm swelling 07/01/2018  . Carotid stenosis 12/19/2017  . Hand cramp 10/17/2017  . Acute non-recurrent maxillary sinusitis 10/17/2017  . Skin lesion 09/29/2017  . Diarrhea 04/23/2017  . Weight loss 09/12/2016  . CKD stage 3 due to type 2 diabetes mellitus ([place]) 08/14/2016  . PAD (peripheral artery disease) ([place]) 08/14/2016  . DM (diabetes mellitus) ([place]) 03/25/2016  . Anemia 03/25/2016  . Chronic tension-type headache, not intractable 03/25/2016  . Essential hypertension 03/25/2016  . CAD (coronary artery disease), native coronary artery 03/25/2016  . GERD (gastroesophageal reflux disease) 03/25/2016  . Hyperlipidemia 03/25/2016  . History of breast cancer 03/25/2016   Pura Spice, PT, DPT # [id number] 12/02/2018, 12:26 PM  [place] [facility] [address] [place], [place], [address] Phone: [phone]   Fax:  [phone]  Name: VIRGEN BELLAND MRN: [id number] Date of Birth: 04-14-1928

## 2018-12-03 ENCOUNTER — Ambulatory Visit: Payer: Medicare HMO | Admitting: Physical Therapy

## 2018-12-03 ENCOUNTER — Other Ambulatory Visit: Payer: Self-pay

## 2018-12-03 DIAGNOSIS — R269 Unspecified abnormalities of gait and mobility: Secondary | ICD-10-CM

## 2018-12-03 DIAGNOSIS — R2689 Other abnormalities of gait and mobility: Secondary | ICD-10-CM

## 2018-12-03 DIAGNOSIS — M6281 Muscle weakness (generalized): Secondary | ICD-10-CM

## 2018-12-03 DIAGNOSIS — Z89612 Acquired absence of left leg above knee: Secondary | ICD-10-CM

## 2018-12-05 ENCOUNTER — Encounter: Payer: Self-pay | Admitting: Physical Therapy

## 2018-12-05 NOTE — Therapy (Signed)
[facility] [address] [place], [place], [address] Phone: [phone]   Fax:  [phone]  Physical Therapy Treatment  Patient Details  Name: Tiffany Bailey MRN: [id number] Date of Birth: August 13, 1927 No data recorded  Encounter Date: 12/03/2018  PT End of Session - 12/05/18 1011    Visit Number  4    Number of Visits  16    Date for PT Re-Evaluation  01/12/19    Authorization Type  medicare aetna    Authorization - Visit Number  4    Authorization - Number of Visits  10    PT Start Time  2725    PT Stop Time  1204    PT Time Calculation (min)  61 min    Equipment Utilized During Treatment  Gait belt    Activity Tolerance  Patient tolerated treatment well;Patient limited by fatigue    Behavior During Therapy  WFL for tasks assessed/performed       Past Medical History:  Diagnosis Date  . Anemia   . Anginal pain ([place])   . Arthritis   . Cancer (HCC)    BREAST  . CKD (chronic kidney disease), stage III ([place])   . Coronary artery disease    S/P CABG and multiple PCI's  . Diabetes mellitus without complication ([place])   . Edema   . Failure to thrive in adult   . GERD (gastroesophageal reflux disease)   . History of breast cancer   . HOH (hard of hearing)   . Hyperlipidemia   . Hypertension   . Myocardial infarction ([place])   . Palpitations     Past Surgical History:  Procedure Laterality Date  . ABDOMINAL HYSTERECTOMY    . ABOVE KNEE LEG AMPUTATION Left 2013  . ANTERIOR VITRECTOMY Left 11/16/2015   Procedure: ANTERIOR VITRECTOMY;  Surgeon: Eulogio Bear, MD;  Location: [facility];  Service: Ophthalmology;  Laterality: Left;  . BLADDER SURGERY    . CATARACT EXTRACTION W/PHACO Left 11/16/2015   Procedure: CATARACT EXTRACTION PHACO AND INTRAOCULAR LENS PLACEMENT (IOC);  Surgeon: Eulogio Bear, MD;  Location: [facility];  Service: Ophthalmology;  Laterality: Left;  Lot # [id number] H [place]; 01:22.8 AP%:12.6 CDE: 10.46  .  CATARACT EXTRACTION W/PHACO Right 08/22/2016   Procedure: CATARACT EXTRACTION PHACO AND INTRAOCULAR LENS PLACEMENT (IOC);  Surgeon: Eulogio Bear, MD;  Location: [facility];  Service: Ophthalmology;  Laterality: Right;  Lot # [id number] H [place]: 00:52.1 AP%:8.7 CDE: 4.99   . CORONARY ANGIOPLASTY     STENT  . CORONARY ARTERY BYPASS GRAFT    . MASTECTOMY    . TUMOR REMOVAL     ABDOMINAL    There were no vitals filed for this visit.  Subjective Assessment - 12/05/18 1010    Subjective  Pt. states he is doing better since increase in ply socks.  Pt. ambulated a little around house with prosthetic leg/ RW.  R lower leg/ foot pain remains present    Patient is accompained by:  Family member    Pertinent History  Pt with PMH HTN, HLD, HOH, GERD, history of breast cancer, DM, CKD III, prevoius MI. AKA performed 2013. Pt currently self propels WC, lives with her son and grandson. Reported she is independent in dressing, bird bathes (needs assistance in/out of tub/shower but does do that intermittently), drives. Pt reported she is able to wear her prosthetic all day but it hurts, and rarely does wear it. Pt able to don/doff independently. Predominantly  uses stand pivot transfers without AD.    Patient Stated Goals  Improve walking with appropriate assistive device.    Currently in Pain?  --   difficulty subjectifying R foot/ L residual pain     Discuss f/u with Staci Righter   Neuro.mm.:   [employer]ire[employer] (wt. Shifting/ marching)- in //-bars with B UE assist required Step ups/ overs while ambulating in //-bars working on L hip control/ preventing circumduction  Gait training:  Amb. From waiting room to //-bars/ //-bars to blue mat/ mat to hallway toward car.  Pt. Requires CGA and cuing to increase heel strike/ step length for prosthesis control.  No episodes of L knee buckling.  Pt. Has difficulty managing L knee mechanism during standing to sitting position.   Pt. Unable to ambulate from gym to [place] secondary  increase R foot pain/ fatigue.     PT Short Term Goals - 11/17/18 1229      PT SHORT TERM GOAL #1   Title  Pt will decrease TUG score to less than 60seconds to indicate improved ambulation tolerance/endurance and decrease risk of falls.    Baseline  90sec 11/17/2018    Time  4    Period  Weeks    Status  New    Target Date  12/15/18        PT Long Term Goals - 11/17/18 1230      PT LONG TERM GOAL #1   Title  Pt will decrease TUG score to less than 45seconds to indicate improved ambulation tolerance/endurance and decrease risk of falls.    Baseline  90sec 11/17/2018    Time  8    Period  Weeks    Status  New    Target Date  01/12/19      PT LONG TERM GOAL #2   Title  Pt will be independent with HEP in order to improve strength and balance in order to decrease fall risk and improve function at home.    Baseline  initial HEP administered 11/17/2018    Time  8    Period  Weeks    Status  New    Target Date  01/12/19      PT LONG TERM GOAL #3   Title  Pt will demonstrate LE strength MMT grades of 5/5 to indicate an improved ability to perform functional activities.    Baseline  see PT evalulation for details 11/17/2018    Time  8    Period  Weeks    Status  New    Target Date  01/12/19            Plan - 12/05/18 1012    Clinical Impression Statement  Pt. requires several seated rest breaks after standing/ walking tasks due to c/o fatigue.  Pt. was able to ambulate 95 feet at best with use of rollator prior to seated rest break.  Increase c/o R foot pain with standing/walking tasks.  Pt. able to consistent step up/over 3" plinth with correction in hip circumduction.    Personal Factors and Comorbidities  Behavior Pattern;Comorbidity 3+;Fitness;Time since onset of injury/illness/exacerbation    Comorbidities  HTN, HLD, GERD, hx of breast cancer, DM, CKD III, history of MI    Examination-Activity Limitations  Stairs;Transfers;Squat;Stand;Locomotion Level;Bed Mobility     Examination-Participation Restrictions  Community Activity;Shop;Meal Prep;Laundry    Stability/Clinical Decision Making  Stable/Uncomplicated    Clinical Decision Making  Moderate    Rehab Potential  Good    PT Frequency  2x / week    PT Duration  8 weeks    PT Treatment/Interventions  ADLs/Self Care Home Management;Aquatic Therapy;Moist Heat;Gait training;DME Instruction;Functional mobility training;Stair training;Therapeutic activities;Therapeutic exercise;Balance training;Neuromuscular re-education;Patient/family education;Prosthetic Training;Passive range of motion;Dry needling;Energy conservation;Taping;Manual techniques    PT Next Visit Plan  continue strengthening to add to HEP, gait training, balance training    PT Home Exercise Plan  see medbridge access code in note; LAQ, seated marching, supine/sidelying hip extension, use of RW to aide in transfers    Consulted and Agree with Plan of Care  Patient;Family member/caregiver    Family Member Consulted  daughter, [name]       Patient will benefit from skilled therapeutic intervention in order to improve the following deficits and impairments:  Abnormal gait, Decreased activity tolerance, Decreased endurance, Decreased strength, Pain, Difficulty walking, Decreased mobility, Decreased balance, Decreased knowledge of use of DME  Visit Diagnosis: 1. Hx of AKA (above knee amputation), left ([place])   2. Muscle weakness (generalized)   3. Gait difficulty   4. Other abnormalities of gait and mobility        Problem List Patient Active Problem List   Diagnosis Date Noted  . Hx of AKA (above knee amputation), left ([place]) 08/26/2018  . Left arm swelling 07/01/2018  . Carotid stenosis 12/19/2017  . Hand cramp 10/17/2017  . Acute non-recurrent maxillary sinusitis 10/17/2017  . Skin lesion 09/29/2017  . Diarrhea 04/23/2017  . Weight loss 09/12/2016  . CKD stage 3 due to type 2 diabetes mellitus ([place]) 08/14/2016  . PAD (peripheral artery  disease) ([place]) 08/14/2016  . DM (diabetes mellitus) ([place]) 03/25/2016  . Anemia 03/25/2016  . Chronic tension-type headache, not intractable 03/25/2016  . Essential hypertension 03/25/2016  . CAD (coronary artery disease), native coronary artery 03/25/2016  . GERD (gastroesophageal reflux disease) 03/25/2016  . Hyperlipidemia 03/25/2016  . History of breast cancer 03/25/2016   Pura Spice, PT, DPT # [id number] 12/05/2018, 10:15 AM  [facility] [address] [place], [place], [address] Phone: [phone]   Fax:  [phone]  Name: Tiffany Bailey MRN: [id number] Date of Birth: February 16, 1928

## 2018-12-08 ENCOUNTER — Other Ambulatory Visit: Payer: Self-pay

## 2018-12-08 ENCOUNTER — Ambulatory Visit: Payer: Medicare HMO | Admitting: Physical Therapy

## 2018-12-08 ENCOUNTER — Encounter: Payer: Self-pay | Admitting: Physical Therapy

## 2018-12-08 DIAGNOSIS — Z89612 Acquired absence of left leg above knee: Secondary | ICD-10-CM

## 2018-12-08 DIAGNOSIS — R2689 Other abnormalities of gait and mobility: Secondary | ICD-10-CM | POA: Diagnosis not present

## 2018-12-08 DIAGNOSIS — M6281 Muscle weakness (generalized): Secondary | ICD-10-CM

## 2018-12-08 DIAGNOSIS — R269 Unspecified abnormalities of gait and mobility: Secondary | ICD-10-CM

## 2018-12-08 NOTE — Therapy (Signed)
[facility] [address]. [place], [place], [address] Phone: [phone]   Fax:  [phone]  Physical Therapy Treatment  Patient Details  Name: Tiffany Bailey MRN: [id number] Date of Birth: [birth year] No data recorded  Encounter Date: 12/08/2018  PT End of Session - 12/08/18 1109    Visit Number  5    Number of Visits  16    Date for PT Re-Evaluation  01/12/19    Authorization Type  medicare aetna    Authorization - Visit Number  5    Authorization - Number of Visits  10    PT Start Time  1101    PT Stop Time  3875    PT Time Calculation (min)  56 min    Equipment Utilized During Treatment  Gait belt    Activity Tolerance  Patient tolerated treatment well;Patient limited by fatigue    Behavior During Therapy  WFL for tasks assessed/performed       Past Medical History:  Diagnosis Date  . Anemia   . Anginal pain ([place])   . Arthritis   . Cancer (HCC)    BREAST  . CKD (chronic kidney disease), stage III ([place])   . Coronary artery disease    S/P CABG and multiple PCI's  . Diabetes mellitus without complication ([place])   . Edema   . Failure to thrive in adult   . GERD (gastroesophageal reflux disease)   . History of breast cancer   . HOH (hard of hearing)   . Hyperlipidemia   . Hypertension   . Myocardial infarction ([place])   . Palpitations     Past Surgical History:  Procedure Laterality Date  . ABDOMINAL HYSTERECTOMY    . ABOVE KNEE LEG AMPUTATION Left 2013  . ANTERIOR VITRECTOMY Left 11/16/2015   Procedure: ANTERIOR VITRECTOMY;  Surgeon: Eulogio Bear, MD;  Location: [facility] ORS;  Service: Ophthalmology;  Laterality: Left;  . BLADDER SURGERY    . CATARACT EXTRACTION W/PHACO Left 11/16/2015   Procedure: CATARACT EXTRACTION PHACO AND INTRAOCULAR LENS PLACEMENT (IOC);  Surgeon: Eulogio Bear, MD;  Location: [facility] ORS;  Service: Ophthalmology;  Laterality: Left;  Lot # [id number] H [place]; 01:22.8 AP%:12.6 CDE: 10.46  .  CATARACT EXTRACTION W/PHACO Right 08/22/2016   Procedure: CATARACT EXTRACTION PHACO AND INTRAOCULAR LENS PLACEMENT (IOC);  Surgeon: Eulogio Bear, MD;  Location: [facility] ORS;  Service: Ophthalmology;  Laterality: Right;  Lot # [id number] H [place]: 00:52.1 AP%:8.7 CDE: 4.99   . CORONARY ANGIOPLASTY     STENT  . CORONARY ARTERY BYPASS GRAFT    . MASTECTOMY    . TUMOR REMOVAL     ABDOMINAL    There were no vitals filed for this visit.  Subjective Assessment - 12/08/18 1102    Subjective  Pt. has a HA today but doing okay overall.  Pt. saw Staci Righter last week and he was happy with pts. progress.  Staci Righter adjusted L leg slightly (shorter) and pt. reports improvement in walking.  Pt. states she is walking home at home and did the entry steps with son's assist.  Pt. entered PT today with rollator and left w/c in car.    Patient is accompained by:  Family member    Pertinent History  Pt with PMH HTN, HLD, HOH, GERD, history of breast cancer, DM, CKD III, prevoius MI. AKA performed 2013. Pt currently self propels WC, lives with her son and grandson. Reported she is independent in dressing, bird bathes (  needs assistance in/out of tub/shower but does do that intermittently), drives. Pt reported she is able to wear her prosthetic all day but it hurts, and rarely does wear it. Pt able to don/doff independently. Predominantly uses stand pivot transfers without AD.    Patient Stated Goals  Improve walking with appropriate assistive device.    Currently in Pain?  Yes    Pain Score  2     Pain Location  Hip    Pain Orientation  Right    Pain Descriptors / Indicators  Aching    Aggravating Factors   Pt. reports increase aching pain in R hip/ lower leg with wt. bearing and walking.         Gait training:  Amb. From car port to //-bars/ //-bar gait training/ //-bars to car on side of building.  Pt. Requires CGA and cuing to increase heel strike/ step length for prosthesis control.  No episodes of L knee  buckling. Pt. Progressed to use of QC on R in //-bars with 2-point gait pattern.  Poor posture but able to correct with cuing/ mirror feedback.   Step ups on 3" plinth and then 6" plinth working on preventing hip circumduction    There.ex.:  Seated 1# UE ex.: press-ups/ punches/ bicep curls 10x2 each. Seated marching/ hip abduction with manual resistance/ LAQ on R/ heel raises on R.   Discussed supine HEP        PT Short Term Goals - 11/17/18 1229      PT SHORT TERM GOAL #1   Title  Pt will decrease TUG score to less than 60seconds to indicate improved ambulation tolerance/endurance and decrease risk of falls.    Baseline  90sec 11/17/2018    Time  4    Period  Weeks    Status  New    Target Date  12/15/18        PT Long Term Goals - 11/17/18 1230      PT LONG TERM GOAL #1   Title  Pt will decrease TUG score to less than 45seconds to indicate improved ambulation tolerance/endurance and decrease risk of falls.    Baseline  90sec 11/17/2018    Time  8    Period  Weeks    Status  New    Target Date  01/12/19      PT LONG TERM GOAL #2   Title  Pt will be independent with HEP in order to improve strength and balance in order to decrease fall risk and improve function at home.    Baseline  initial HEP administered 11/17/2018    Time  8    Period  Weeks    Status  New    Target Date  01/12/19      PT LONG TERM GOAL #3   Title  Pt will demonstrate LE strength MMT grades of 5/5 to indicate an improved ability to perform functional activities.    Baseline  see PT evalulation for details 11/17/2018    Time  8    Period  Weeks    Status  New    Target Date  01/12/19            Plan - 12/08/18 1110    Clinical Impression Statement  Pt. was able to exit car with use of rollator and ambulate into PT clinic.  Pt. attempted 2-point gait pattern in //-bar with R UE assist on bar.  Pt. progressed to use of QC on R with  short distance ambulation.  Pt. requires assist to increase R  LE step length which is limited with use of QC as compared to rollator.  Several short seated rest breaks today but no use of w/c required.  R ankle/ hip discomfort t/o tx. session.    Personal Factors and Comorbidities  Behavior Pattern;Comorbidity 3+;Fitness;Time since onset of injury/illness/exacerbation    Comorbidities  HTN, HLD, GERD, hx of breast cancer, DM, CKD III, history of MI    Examination-Activity Limitations  Stairs;Transfers;Squat;Stand;Locomotion Level;Bed Mobility    Examination-Participation Restrictions  Community Activity;Shop;Meal Prep;Laundry    Stability/Clinical Decision Making  Stable/Uncomplicated    Clinical Decision Making  Moderate    Rehab Potential  Good    PT Frequency  2x / week    PT Duration  8 weeks    PT Treatment/Interventions  ADLs/Self Care Home Management;Aquatic Therapy;Moist Heat;Gait training;DME Instruction;Functional mobility training;Stair training;Therapeutic activities;Therapeutic exercise;Balance training;Neuromuscular re-education;Patient/family education;Prosthetic Training;Passive range of motion;Dry needling;Energy conservation;Taping;Manual techniques    PT Next Visit Plan  continue strengthening to add to HEP, gait training, balance training    PT Home Exercise Plan  see medbridge access code in note; LAQ, seated marching, supine/sidelying hip extension, use of RW to aide in transfers    Consulted and Agree with Plan of Care  Patient;Family member/caregiver    Family Member Consulted  daughter, [name]       Patient will benefit from skilled therapeutic intervention in order to improve the following deficits and impairments:  Abnormal gait, Decreased activity tolerance, Decreased endurance, Decreased strength, Pain, Difficulty walking, Decreased mobility, Decreased balance, Decreased knowledge of use of DME  Visit Diagnosis: 1. Hx of AKA (above knee amputation), left ([place])   2. Muscle weakness (generalized)   3. Gait difficulty   4.  Other abnormalities of gait and mobility        Problem List Patient Active Problem List   Diagnosis Date Noted  . Hx of AKA (above knee amputation), left ([place]) 08/26/2018  . Left arm swelling 07/01/2018  . Carotid stenosis 12/19/2017  . Hand cramp 10/17/2017  . Acute non-recurrent maxillary sinusitis 10/17/2017  . Skin lesion 09/29/2017  . Diarrhea 04/23/2017  . Weight loss 09/12/2016  . CKD stage 3 due to type 2 diabetes mellitus ([place]) 08/14/2016  . PAD (peripheral artery disease) ([place]) 08/14/2016  . DM (diabetes mellitus) ([place]) 03/25/2016  . Anemia 03/25/2016  . Chronic tension-type headache, not intractable 03/25/2016  . Essential hypertension 03/25/2016  . CAD (coronary artery disease), native coronary artery 03/25/2016  . GERD (gastroesophageal reflux disease) 03/25/2016  . Hyperlipidemia 03/25/2016  . History of breast cancer 03/25/2016   Pura Spice, PT, DPT # [id number] 12/08/2018, 12:52 PM  [facility] [address] [place], [place], [address] Phone: [phone]   Fax:  [phone]  Name: CAILIE BOSSHART MRN: [id number] Date of Birth: March 02, 1928

## 2018-12-10 ENCOUNTER — Other Ambulatory Visit: Payer: Self-pay

## 2018-12-10 ENCOUNTER — Ambulatory Visit: Payer: Medicare HMO | Admitting: Physical Therapy

## 2018-12-10 ENCOUNTER — Encounter: Payer: Self-pay | Admitting: Physical Therapy

## 2018-12-10 DIAGNOSIS — R2689 Other abnormalities of gait and mobility: Secondary | ICD-10-CM | POA: Diagnosis not present

## 2018-12-10 DIAGNOSIS — M6281 Muscle weakness (generalized): Secondary | ICD-10-CM

## 2018-12-10 DIAGNOSIS — Z89612 Acquired absence of left leg above knee: Secondary | ICD-10-CM

## 2018-12-10 DIAGNOSIS — R269 Unspecified abnormalities of gait and mobility: Secondary | ICD-10-CM

## 2018-12-10 NOTE — Therapy (Signed)
[facility] [address]. [place], [place], [address] Phone: [phone]   Fax:  [phone]  Physical Therapy Treatment  Patient Details  Name: Tiffany Bailey MRN: [id number] Date of Birth: 28-Mar-1928 No data recorded  Encounter Date: 12/10/2018  PT End of Session - 12/10/18 1308    Visit Number  6    Number of Visits  16    Date for PT Re-Evaluation  01/12/19    Authorization Type  medicare aetna    Authorization - Visit Number  6    Authorization - Number of Visits  10    PT Start Time  1104    PT Stop Time  1149    PT Time Calculation (min)  45 min    Equipment Utilized During Treatment  Gait belt    Activity Tolerance  Patient tolerated treatment well;Patient limited by fatigue    Behavior During Therapy  WFL for tasks assessed/performed       Past Medical History:  Diagnosis Date  . Anemia   . Anginal pain ([place])   . Arthritis   . Cancer (HCC)    BREAST  . CKD (chronic kidney disease), stage III ([place])   . Coronary artery disease    S/P CABG and multiple PCI's  . Diabetes mellitus without complication ([place])   . Edema   . Failure to thrive in adult   . GERD (gastroesophageal reflux disease)   . History of breast cancer   . HOH (hard of hearing)   . Hyperlipidemia   . Hypertension   . Myocardial infarction ([place])   . Palpitations     Past Surgical History:  Procedure Laterality Date  . ABDOMINAL HYSTERECTOMY    . ABOVE KNEE LEG AMPUTATION Left 2013  . ANTERIOR VITRECTOMY Left 11/16/2015   Procedure: ANTERIOR VITRECTOMY;  Surgeon: Eulogio Bear, MD;  Location: [facility] ORS;  Service: Ophthalmology;  Laterality: Left;  . BLADDER SURGERY    . CATARACT EXTRACTION W/PHACO Left 11/16/2015   Procedure: CATARACT EXTRACTION PHACO AND INTRAOCULAR LENS PLACEMENT (IOC);  Surgeon: Eulogio Bear, MD;  Location: [facility] OR[facility];  Service: Ophthalmology;  Laterality: Left;  Lot # [id number] H [place]; 01:22.8 AP%:12.6 CDE: 10.46  .  CATARACT EXTRACTION W/PHACO Right 08/22/2016   Procedure: CATARACT EXTRACTION PHACO AND INTRAOCULAR LENS PLACEMENT (IOC);  Surgeon: Eulogio Bear, MD;  Location: [facility] ORS;  Service: Ophthalmology;  Laterality: Right;  Lot # [id number] H [place]: 00:52.1 AP%:8.7 CDE: 4.99   . CORONARY ANGIOPLASTY     STENT  . CORONARY ARTERY BYPASS GRAFT    . MASTECTOMY    . TUMOR REMOVAL     ABDOMINAL    There were no vitals filed for this visit.  Subjective Assessment - 12/10/18 1306    Subjective  Pt. states she is feeling good today.  Minimal c/o R ankle discomfort while walking into PT clinic today.    Patient is accompained by:  Family member    Pertinent History  Pt with PMH HTN, HLD, HOH, GERD, history of breast cancer, DM, CKD III, prevoius MI. AKA performed 2013. Pt currently self propels WC, lives with her son and grandson. Reported she is independent in dressing, bird bathes (needs assistance in/out of tub/shower but does do that intermittently), drives. Pt reported she is able to wear her prosthetic all day but it hurts, and rarely does wear it. Pt able to don/doff independently. Predominantly uses stand pivot transfers without AD.    Patient Stated  Goals  Improve walking with appropriate assistive device.    Currently in Pain?  No/denies        There.ex.:  Supine on blue mat table: SLR/ marching/ hip abduction/ bridging 10x2 each.  Pt. Reports L hip flexor fatigue during hip abduction.   Seated 1# UE ex.: press-ups/ punches/ bicep curls 20x each.  Tricep press-ups on chair arm rests 10x. Standing 3" plinth touches with L/R LE and B UE assist for posture correction/ mirror feedback  Gait training:   Ambulate from car to blue mat table/ mat table to //-bars/ //-bars to car (all with use of rollator)- working on increase R LE step length/ heel strike.  Moderate cuing for posture correction/ head position. Progressing to use of QC with 2-point gait pattern and CGA for safety/ cuing. Ambulate  forward/ backwards in //-bars with R UE assist only 4x2.   PT Short Term Goals - 11/17/18 1229      PT SHORT TERM GOAL #1   Title  Pt will decrease TUG score to less than 60seconds to indicate improved ambulation tolerance/endurance and decrease risk of falls.    Baseline  90sec 11/17/2018    Time  4    Period  Weeks    Status  New    Target Date  12/15/18        PT Long Term Goals - 11/17/18 1230      PT LONG TERM GOAL #1   Title  Pt will decrease TUG score to less than 45seconds to indicate improved ambulation tolerance/endurance and decrease risk of falls.    Baseline  90sec 11/17/2018    Time  8    Period  Weeks    Status  New    Target Date  01/12/19      PT LONG TERM GOAL #2   Title  Pt will be independent with HEP in order to improve strength and balance in order to decrease fall risk and improve function at home.    Baseline  initial HEP administered 11/17/2018    Time  8    Period  Weeks    Status  New    Target Date  01/12/19      PT LONG TERM GOAL #3   Title  Pt will demonstrate LE strength MMT grades of 5/5 to indicate an improved ability to perform functional activities.    Baseline  see PT evalulation for details 11/17/2018    Time  8    Period  Weeks    Status  New    Target Date  01/12/19            Plan - 12/10/18 1308    Clinical Impression Statement  Pt. able to ambulate from PT clinic to car (side of building) with no rest breaks today.  Pt. is doing well with improving treatement standing/ walking tolerance.  Increase R ankle burning/ neuropathy symptoms after gait training/ standing step touches.  Pt. instructed to ambulate more at home and increase distance as tolerated.    Personal Factors and Comorbidities  Behavior Pattern;Comorbidity 3+;Fitness;Time since onset of injury/illness/exacerbation    Comorbidities  HTN, HLD, GERD, hx of breast cancer, DM, CKD III, history of MI    Examination-Activity Limitations   Stairs;Transfers;Squat;Stand;Locomotion Level;Bed Mobility    Examination-Participation Restrictions  Community Activity;Shop;Meal Prep;Laundry    Stability/Clinical Decision Making  Stable/Uncomplicated    Clinical Decision Making  Moderate    Rehab Potential  Good    PT Frequency  2x / week    PT Duration  8 weeks    PT Treatment/Interventions  ADLs/Self Care Home Management;Aquatic Therapy;Moist Heat;Gait training;DME Instruction;Functional mobility training;Stair training;Therapeutic activities;Therapeutic exercise;Balance training;Neuromuscular re-education;Patient/family education;Prosthetic Training;Passive range of motion;Dry needling;Energy conservation;Taping;Manual techniques    PT Next Visit Plan  continue strengthening to add to HEP, gait training, balance training    PT Home Exercise Plan  see medbridge access code in note; LAQ, seated marching, supine/sidelying hip extension, use of RW to aide in transfers    Consulted and Agree with Plan of Care  Patient;Family member/caregiver    Family Member Consulted  daughter, [name]       Patient will benefit from skilled therapeutic intervention in order to improve the following deficits and impairments:  Abnormal gait, Decreased activity tolerance, Decreased endurance, Decreased strength, Pain, Difficulty walking, Decreased mobility, Decreased balance, Decreased knowledge of use of DME  Visit Diagnosis: 1. Muscle weakness (generalized)   2. Hx of AKA (above knee amputation), left (HCC)   3. Gait difficulty   4. Other abnormalities of gait and mobility        Problem List Patient Active Problem List   Diagnosis Date Noted  . Hx of AKA (above knee amputation), left ([place]) 08/26/2018  . Left arm swelling 07/01/2018  . Carotid stenosis 12/19/2017  . Hand cramp 10/17/2017  . Acute non-recurrent maxillary sinusitis 10/17/2017  . Skin lesion 09/29/2017  . Diarrhea 04/23/2017  . Weight loss 09/12/2016  . CKD stage 3 due to type 2  diabetes mellitus ([place]) 08/14/2016  . PAD (peripheral artery disease) ([place]) 08/14/2016  . DM (diabetes mellitus) ([place]) 03/25/2016  . Anemia 03/25/2016  . Chronic tension-type headache, not intractable 03/25/2016  . Essential hypertension 03/25/2016  . CAD (coronary artery disease), native coronary artery 03/25/2016  . GERD (gastroesophageal reflux disease) 03/25/2016  . Hyperlipidemia 03/25/2016  . History of breast cancer 03/25/2016   Pura Spice, PT, DPT # [id number] 12/10/2018, 1:32 PM  [facility] [address] [place], [place], [address] Phone: [phone]   Fax:  [phone]  Name: ORIAN AMBERG MRN: [id number] Date of Birth: May 14, 1928

## 2018-12-15 ENCOUNTER — Other Ambulatory Visit: Payer: Self-pay

## 2018-12-15 ENCOUNTER — Ambulatory Visit: Payer: Medicare HMO | Admitting: Physical Therapy

## 2018-12-15 ENCOUNTER — Encounter: Payer: Self-pay | Admitting: Physical Therapy

## 2018-12-15 DIAGNOSIS — M6281 Muscle weakness (generalized): Secondary | ICD-10-CM

## 2018-12-15 DIAGNOSIS — Z89612 Acquired absence of left leg above knee: Secondary | ICD-10-CM

## 2018-12-15 DIAGNOSIS — R269 Unspecified abnormalities of gait and mobility: Secondary | ICD-10-CM

## 2018-12-15 DIAGNOSIS — R2689 Other abnormalities of gait and mobility: Secondary | ICD-10-CM

## 2018-12-15 NOTE — Therapy (Signed)
[facility] [address]. [place], [place], [address] Phone: [phone]   Fax:  [phone]  Physical Therapy Treatment  Patient Details  Name: Tiffany Bailey MRN: [id number] Date of Birth: 1928-03-09 No data recorded  Encounter Date: 12/15/2018  PT End of Session - 12/15/18 1723    Visit Number  7    Number of Visits  16    Date for PT Re-Evaluation  01/12/19    Authorization Type  medicare aetna    Authorization - Visit Number  7    Authorization - Number of Visits  10    PT Start Time  1109    PT Stop Time  1205    PT Time Calculation (min)  56 min    Equipment Utilized During Treatment  Gait belt    Activity Tolerance  Patient tolerated treatment well;Patient limited by fatigue    Behavior During Therapy  WFL for tasks assessed/performed       Past Medical History:  Diagnosis Date  . Anemia   . Anginal pain ([place])   . Arthritis   . Cancer (HCC)    BREAST  . CKD (chronic kidney disease), stage III ([place])   . Coronary artery disease    S/P CABG and multiple PCI's  . Diabetes mellitus without complication ([place])   . Edema   . Failure to thrive in adult   . GERD (gastroesophageal reflux disease)   . History of breast cancer   . HOH (hard of hearing)   . Hyperlipidemia   . Hypertension   . Myocardial infarction ([place])   . Palpitations     Past Surgical History:  Procedure Laterality Date  . ABDOMINAL HYSTERECTOMY    . ABOVE KNEE LEG AMPUTATION Left 2013  . ANTERIOR VITRECTOMY Left 11/16/2015   Procedure: ANTERIOR VITRECTOMY;  Surgeon: Eulogio Bear, MD;  Location: [facility] ORS;  Service: Ophthalmology;  Laterality: Left;  . BLADDER SURGERY    . CATARACT EXTRACTION W/PHACO Left 11/16/2015   Procedure: CATARACT EXTRACTION PHACO AND INTRAOCULAR LENS PLACEMENT (IOC);  Surgeon: Eulogio Bear, MD;  Location: [facility] OR[facility];  Service: Ophthalmology;  Laterality: Left;  Lot # [id number] H [place]; 01:22.8 AP%:12.6 CDE: 10.46  .  CATARACT EXTRACTION W/PHACO Right 08/22/2016   Procedure: CATARACT EXTRACTION PHACO AND INTRAOCULAR LENS PLACEMENT (IOC);  Surgeon: Eulogio Bear, MD;  Location: [facility] ORS;  Service: Ophthalmology;  Laterality: Right;  Lot # [id number] H [place]: 00:52.1 AP%:8.7 CDE: 4.99   . CORONARY ANGIOPLASTY     STENT  . CORONARY ARTERY BYPASS GRAFT    . MASTECTOMY    . TUMOR REMOVAL     ABDOMINAL    There were no vitals filed for this visit.  Subjective Assessment - 12/15/18 1721    Subjective  Pt. states R ankle is hurting today and has swelling in toes.  No residual limb symptoms reported while donning prosthesis.  Pt. states she is walking more at home but dtr. reports pt. is only wearing prosthesis limited amount of time.    Patient is accompained by:  Family member    Pertinent History  Pt with PMH HTN, HLD, HOH, GERD, history of breast cancer, DM, CKD III, prevoius MI. AKA performed 2013. Pt currently self propels WC, lives with her son and grandson. Reported she is independent in dressing, bird bathes (needs assistance in/out of tub/shower but does do that intermittently), drives. Pt reported she is able to wear her prosthetic all day but it  hurts, and rarely does wear it. Pt able to don/doff independently. Predominantly uses stand pivot transfers without AD.    Patient Stated Goals  Improve walking with appropriate assistive device.    Currently in Pain?  Yes    Pain Score  4     Pain Location  Leg    Pain Orientation  Right;Lower       There.ex.:  Seated 1# UE ex.: press-ups/ punches/ bicep curls 20x each.  Tricep press-ups on chair arm rests 10x. Standing step touches/ ups with L/R LE and B UE (progressing to R UE only) assist for posture correction/ mirror feedback.  Seated rest break required. Seated marches/ hip abduction/ sit to stands.   Gait training:   Ambulate from car to gym (rollator)/ //-bars to stairs (QC use)/ stairs to car (with rollator)- working on increase R LE step  length/ heel strike.  Moderate cuing for posture correction/ head position. Use of QC with 2-point gait pattern and CGA for safety/ cuing.  No episodes of L knee buckling.  Ambulate forward/ backwards in //-bars with R UE assist only 4x2.   PT Short Term Goals - 11/17/18 1229      PT SHORT TERM GOAL #1   Title  Pt will decrease TUG score to less than 60seconds to indicate improved ambulation tolerance/endurance and decrease risk of falls.    Baseline  90sec 11/17/2018    Time  4    Period  Weeks    Status  New    Target Date  12/15/18        PT Long Term Goals - 11/17/18 1230      PT LONG TERM GOAL #1   Title  Pt will decrease TUG score to less than 45seconds to indicate improved ambulation tolerance/endurance and decrease risk of falls.    Baseline  90sec 11/17/2018    Time  8    Period  Weeks    Status  New    Target Date  01/12/19      PT LONG TERM GOAL #2   Title  Pt will be independent with HEP in order to improve strength and balance in order to decrease fall risk and improve function at home.    Baseline  initial HEP administered 11/17/2018    Time  8    Period  Weeks    Status  New    Target Date  01/12/19      PT LONG TERM GOAL #3   Title  Pt will demonstrate LE strength MMT grades of 5/5 to indicate an improved ability to perform functional activities.    Baseline  see PT evalulation for details 11/17/2018    Time  8    Period  Weeks    Status  New    Target Date  01/12/19            Plan - 12/15/18 1724    Clinical Impression Statement  Pt. progressing to short distance ambulation with use of QC on R side with 2 point gait pattern and increase R LE step length.  No increase c/o R lower leg/ ankle pain.  Increase standing/ walking endurance noted over past 2 tx. sessions.  Rest breaks still required primarily due to R lower leg/ ankle pain.    Personal Factors and Comorbidities  Behavior Pattern;Comorbidity 3+;Fitness;Time since onset of  injury/illness/exacerbation    Comorbidities  HTN, HLD, GERD, hx of breast cancer, DM, CKD III, history of MI  Examination-Activity Limitations  Stairs;Transfers;Squat;Stand;Locomotion Level;Bed Mobility    Examination-Participation Restrictions  Community Activity;Shop;Meal Prep;Laundry    Stability/Clinical Decision Making  Stable/Uncomplicated    Clinical Decision Making  Moderate    Rehab Potential  Good    PT Frequency  2x / week    PT Duration  8 weeks    PT Treatment/Interventions  ADLs/Self Care Home Management;Aquatic Therapy;Moist Heat;Gait training;DME Instruction;Functional mobility training;Stair training;Therapeutic activities;Therapeutic exercise;Balance training;Neuromuscular re-education;Patient/family education;Prosthetic Training;Passive range of motion;Dry needling;Energy conservation;Taping;Manual techniques    PT Next Visit Plan  continue strengthening to add to HEP, gait training, balance training    PT Home Exercise Plan  see medbridge access code in note; LAQ, seated marching, supine/sidelying hip extension, use of RW to aide in transfers    Consulted and Agree with Plan of Care  Patient;Family member/caregiver    Family Member Consulted  daughter, [name]       Patient will benefit from skilled therapeutic intervention in order to improve the following deficits and impairments:  Abnormal gait, Decreased activity tolerance, Decreased endurance, Decreased strength, Pain, Difficulty walking, Decreased mobility, Decreased balance, Decreased knowledge of use of DME  Visit Diagnosis: 1. Hx of AKA (above knee amputation), left ([place])   2. Muscle weakness (generalized)   3. Gait difficulty   4. Other abnormalities of gait and mobility        Problem List Patient Active Problem List   Diagnosis Date Noted  . Hx of AKA (above knee amputation), left ([place]) 08/26/2018  . Left arm swelling 07/01/2018  . Carotid stenosis 12/19/2017  . Hand cramp 10/17/2017  . Acute  non-recurrent maxillary sinusitis 10/17/2017  . Skin lesion 09/29/2017  . Diarrhea 04/23/2017  . Weight loss 09/12/2016  . CKD stage 3 due to type 2 diabetes mellitus ([place]) 08/14/2016  . PAD (peripheral artery disease) ([place]) 08/14/2016  . DM (diabetes mellitus) ([place]) 03/25/2016  . Anemia 03/25/2016  . Chronic tension-type headache, not intractable 03/25/2016  . Essential hypertension 03/25/2016  . CAD (coronary artery disease), native coronary artery 03/25/2016  . GERD (gastroesophageal reflux disease) 03/25/2016  . Hyperlipidemia 03/25/2016  . History of breast cancer 03/25/2016   Pura Spice, PT, DPT # [phone] 12/15/2018, 5:27 PM  [facility] [address] [place], [place], [address] Phone: [phone]   Fax:  [phone]  Name: Tiffany Bailey MRN: [id number] Date of Birth: Jun 23, 1927

## 2018-12-16 ENCOUNTER — Encounter: Payer: Self-pay | Admitting: Family

## 2018-12-16 ENCOUNTER — Ambulatory Visit (INDEPENDENT_AMBULATORY_CARE_PROVIDER_SITE_OTHER): Payer: Medicare HMO | Admitting: Family

## 2018-12-16 ENCOUNTER — Other Ambulatory Visit: Payer: Self-pay

## 2018-12-16 DIAGNOSIS — I739 Peripheral vascular disease, unspecified: Secondary | ICD-10-CM

## 2018-12-16 DIAGNOSIS — M79674 Pain in right toe(s): Secondary | ICD-10-CM | POA: Diagnosis not present

## 2018-12-16 DIAGNOSIS — F419 Anxiety disorder, unspecified: Secondary | ICD-10-CM

## 2018-12-16 DIAGNOSIS — F32A Depression, unspecified: Secondary | ICD-10-CM | POA: Insufficient documentation

## 2018-12-16 DIAGNOSIS — F329 Major depressive disorder, single episode, unspecified: Secondary | ICD-10-CM

## 2018-12-16 DIAGNOSIS — M7989 Other specified soft tissue disorders: Secondary | ICD-10-CM

## 2018-12-16 MED ORDER — SERTRALINE HCL 50 MG PO TABS: 50.0000 mg | ORAL_TABLET | Freq: Every day | ORAL | 3 refills | Status: DC

## 2018-12-16 NOTE — Progress Notes (Signed)
Verbal consent for services obtained from patient prior to services given.  Location of call:  provider at work patient at home  Names of all persons present for services: [name], NP Chief complaint:   CC: hearing heartbeat in her both ears, for past couple of months, mostly at night when laying down. Waxes and wanes.  'if rawled up, worse.' Unsure if related to 'nerves'. Cannot sleep with sound. NO headaches anymore and has stopped nortriptyline.  Son out of work and lives with her, draining her financially.  No pain in ears or ear discharge. Some hearing loss which is not new.  Not sleeping well due to anxiety. Thinks may be depressed as lost appetite as well. No si/hi.  No associated exertional chest pain or pressure, numbness or tingling radiating to left arm or jaw, palpitations, dizziness,changes in vision, or shortness of breath.   All of right  toes are swollen for several months. Feet and calf are not swollen. No pain in the calf. No redness , increased heat, laceration, open wounds. Improves when supine.  Foot stays cold 'most of time the time' which has been on going for years from 'poor circulation'.  Not pale in color. No numbness.  Currently doing PT.   No h/o gout.  H/o DM.  HTN- not checking blood pressure as cuff if broken. Compliant with medications.      History, background, results pertinent:   Headache disorder- seen by neurology 11/02/18. Started on nortriptyline 10mg .  CT head 11/10/18- no acute intracranial.  US carotid BL- ICA 1-39% stenosis  A/P/next steps: Problem List Items Addressed This Visit      Cardiovascular and Mediastinum   PAD (peripheral artery disease) (HCC) (Chronic)    Suspect right toe swelling is related to PAD. She has not been elevating leg or wearing compression stockings. She doesn't feel 'cold' is different than years prior nor does she feel any of symptoms warrant in person evaluation in ED today. Referral replaced to  vascular. Advised elevation, compression stockings. She will let me know how she is doing.        Other   Anxiety and depression - Primary    Pulsation in head is nonspecific and certainly hard to assess over virtual medicine. Patient understands these limitations. Reassured HA's have resolved. She will send me blood pressure readings. Normal CT Head 2 months ago.  I do suspect anxiety, depression playing a role in constellation of symptoms and patient agrees. Will trial zoloft with close follow up       Relevant Medications   sertraline (ZOLOFT) 50 MG tablet    Other Visit Diagnoses    Pain and swelling of toe of right foot       Relevant Orders   Ambulatory referral to Vascular Surgery      I spent 25 min  discussing plan of care over the phone.

## 2018-12-16 NOTE — Assessment & Plan Note (Signed)
Suspect right toe swelling is related to PAD. She has not been elevating leg or wearing compression stockings. She doesn't feel 'cold' is different than years prior nor does she feel any of symptoms warrant in person evaluation in ED today. Referral replaced to vascular. Advised elevation, compression stockings. She will let me know how she is doing.

## 2018-12-16 NOTE — Assessment & Plan Note (Signed)
Pulsation in head is nonspecific and certainly hard to assess over virtual medicine. Patient understands these limitations. Reassured HA's have resolved. She will send me blood pressure readings. Normal CT Head 2 months ago.  I do suspect anxiety, depression playing a role in constellation of symptoms and patient agrees. Will trial zoloft with close follow up

## 2018-12-16 NOTE — Patient Instructions (Addendum)
We need to know if you are on the nortriptyline.  We will review your medications.   I need blood pressure readings- call the office with blood pressure readings.   Start zoloft.  We may need to increase the medication at follow up.  Elevate your right leg. Wear compression stocking.   Im concerned about your right toes ,please let me know if these symptoms were to worsen of you were concerned with your blood flow to your right leg; if worsens, you would need to seek immediate treatment in the emergency room as you could risk loosing your right leg.   Today we discussed referrals, orders. Vascular    I have placed these orders in the system for you.  Please be sure to give [place] a call if you have not heard from our office regarding this. We should hear from [place] within ONE week with information regarding your appointment. If not, please let me know immediately.    Close follow up and let me know how you are doing.

## 2018-12-17 ENCOUNTER — Other Ambulatory Visit: Payer: Self-pay

## 2018-12-17 ENCOUNTER — Encounter: Payer: Self-pay | Admitting: Physical Therapy

## 2018-12-17 ENCOUNTER — Ambulatory Visit: Payer: Medicare HMO | Attending: Family | Admitting: Physical Therapy

## 2018-12-17 DIAGNOSIS — Z89612 Acquired absence of left leg above knee: Secondary | ICD-10-CM | POA: Diagnosis present

## 2018-12-17 DIAGNOSIS — R269 Unspecified abnormalities of gait and mobility: Secondary | ICD-10-CM | POA: Diagnosis present

## 2018-12-17 DIAGNOSIS — M6281 Muscle weakness (generalized): Secondary | ICD-10-CM | POA: Diagnosis present

## 2018-12-17 DIAGNOSIS — R2689 Other abnormalities of gait and mobility: Secondary | ICD-10-CM | POA: Insufficient documentation

## 2018-12-17 NOTE — Progress Notes (Signed)
Mailed and 6 week f/u scheduled.

## 2018-12-17 NOTE — Therapy (Signed)
[facility] [address]. [place], [place], [address] Phone: [phone]   Fax:  [phone]  Physical Therapy Treatment/ Discharge  Patient Details  Name: Tiffany Bailey MRN: [id number] Date of Birth: 09/09/1927 No data recorded  Encounter Date: 12/17/2018  PT End of Session - 12/17/18 2048    Visit Number  8    Number of Visits  16    Date for PT Re-Evaluation  01/12/19    Authorization Type  medicare aetna    Authorization - Visit Number  8    Authorization - Number of Visits  10    PT Start Time  1109    PT Stop Time  1210    PT Time Calculation (min)  61 min    Equipment Utilized During Treatment  Gait belt    Activity Tolerance  Patient tolerated treatment well;Patient limited by fatigue    Behavior During Therapy  WFL for tasks assessed/performed       Past Medical History:  Diagnosis Date  . Anemia   . Anginal pain ([place])   . Arthritis   . Cancer (HCC)    BREAST  . CKD (chronic kidney disease), stage III ([place])   . Coronary artery disease    S/P CABG and multiple PCI's  . Diabetes mellitus without complication ([place])   . Edema   . Failure to thrive in adult   . GERD (gastroesophageal reflux disease)   . History of breast cancer   . HOH (hard of hearing)   . Hyperlipidemia   . Hypertension   . Myocardial infarction ([place])   . Palpitations     Past Surgical History:  Procedure Laterality Date  . ABDOMINAL HYSTERECTOMY    . ABOVE KNEE LEG AMPUTATION Left 2013  . ANTERIOR VITRECTOMY Left 11/16/2015   Procedure: ANTERIOR VITRECTOMY;  Surgeon: Eulogio Bear, MD;  Location: [facility] ORS;  Service: Ophthalmology;  Laterality: Left;  . BLADDER SURGERY    . CATARACT EXTRACTION W/PHACO Left 11/16/2015   Procedure: CATARACT EXTRACTION PHACO AND INTRAOCULAR LENS PLACEMENT (IOC);  Surgeon: Eulogio Bear, MD;  Location: [facility] ORS;  Service: Ophthalmology;  Laterality: Left;  Lot # [id number] H [place]; 01:22.8 AP%:12.6 CDE: 10.46   . CATARACT EXTRACTION W/PHACO Right 08/22/2016   Procedure: CATARACT EXTRACTION PHACO AND INTRAOCULAR LENS PLACEMENT (IOC);  Surgeon: Eulogio Bear, MD;  Location: [facility] ORS;  Service: Ophthalmology;  Laterality: Right;  Lot # [id number] H [place]: 00:52.1 AP%:8.7 CDE: 4.99   . CORONARY ANGIOPLASTY     STENT  . CORONARY ARTERY BYPASS GRAFT    . MASTECTOMY    . TUMOR REMOVAL     ABDOMINAL    There were no vitals filed for this visit.  Subjective Assessment - 12/17/18 2046    Subjective  Pt. continues to c/o R ankle pain with standing.  Pt. states she is walking out to porch with use of RW/ prosthesis at home.  Pt. states she is not walking as much as she should.    Patient is accompained by:  Family member    Pertinent History  Pt with PMH HTN, HLD, HOH, GERD, history of breast cancer, DM, CKD III, prevoius MI. AKA performed 2013. Pt currently self propels WC, lives with her son and grandson. Reported she is independent in dressing, bird bathes (needs assistance in/out of tub/shower but does do that intermittently), drives. Pt reported she is able to wear her prosthetic all day but it hurts, and rarely does  wear it. Pt able to don/doff independently. Predominantly uses stand pivot transfers without AD.    Patient Stated Goals  Improve walking with appropriate assistive device.    Currently in Pain?  Yes    Pain Score  2     Pain Location  Ankle    Pain Orientation  Right    Pain Descriptors / Indicators  Aching          There.ex.:  Standing step touches/ ups with L/R LE and R UE only.  PT assist for posture correction/ mirror feedback. Seated marches/ hip abduction 20x.  Reviewed current supine HEP (no issues or questions).  Seated 1# UE ex.: press-ups/ punches/ bicep curls 20x each.   Neuro:  Tandem Airex (sidesteping) with B UE assist (difficulty with posture due to L foot placement on Airex) Functional reaching with cones with R UE (SBA for safety) Turning CW/ CCW in //-bars  with 1 UE assist  Gait training:   Ambulate from car to gym (rollator)/ //-bars to stairs (QC use)/ stairs to car (with rollator)- working on increase R LE step length/ heel strike. Moderate cuing for posture correction/ head position. Ambulate forward/ backwards in //-bars with R UE assist only 4x2. Ascend/ descend 4 stairs with step to pattern and mod. I safely.     PT Short Term Goals - 12/17/18 1126      PT SHORT TERM GOAL #1   Title  Pt will decrease TUG score to less than 60seconds to indicate improved ambulation tolerance/endurance and decrease risk of falls.    Baseline  90sec 11/17/2018.   7/2: 36 seconds    Time  4    Period  Weeks    Status  Achieved    Target Date  12/17/18        PT Long Term Goals - 12/17/18 1127      PT LONG TERM GOAL #1   Title  Pt will decrease TUG score to less than 45seconds to indicate improved ambulation tolerance/endurance and decrease risk of falls.    Baseline  90sec 11/17/2018.  7/2: 36 seconds    Time  8    Period  Weeks    Status  Achieved    Target Date  12/17/18      PT LONG TERM GOAL #2   Title  Pt will be independent with HEP in order to improve strength and balance in order to decrease fall risk and improve function at home.    Baseline  Goal met    Time  8    Period  Weeks    Status  Achieved    Target Date  12/17/18      PT LONG TERM GOAL #3   Title  Pt will demonstrate LE strength MMT grades of 5/5 to indicate an improved ability to perform functional activities.    Baseline  Good understanding and progression of LE strengthening.  Pt. is limited by R ankle discomfort/ "achy")    Time  8    Period  Weeks    Status  Achieved    Target Date  12/17/18            Plan - 12/17/18 2049    Clinical Impression Statement  Pt. has worked hard with PT over past several weeks.  Pt. states she feels ready for discharge from PT and understand current LE HEP.  Pt. feels comfortable with walking and use of rollator at home.   Pt. knows she needs to  be more active with walking at home.  Pt. instructed to contact PT over next couple of weeks if any issues or questions.  All PT goals met.    Personal Factors and Comorbidities  Behavior Pattern;Comorbidity 3+;Fitness;Time since onset of injury/illness/exacerbation    Comorbidities  HTN, HLD, GERD, hx of breast cancer, DM, CKD III, history of MI    Examination-Activity Limitations  Stairs;Transfers;Squat;Stand;Locomotion Level;Bed Mobility    Examination-Participation Restrictions  Community Activity;Shop;Meal Prep;Laundry    Stability/Clinical Decision Making  Stable/Uncomplicated    Clinical Decision Making  Moderate    Rehab Potential  Good    PT Frequency  2x / week    PT Duration  8 weeks    PT Treatment/Interventions  ADLs/Self Care Home Management;Aquatic Therapy;Moist Heat;Gait training;DME Instruction;Functional mobility training;Stair training;Therapeutic activities;Therapeutic exercise;Balance training;Neuromuscular re-education;Patient/family education;Prosthetic Training;Passive range of motion;Dry needling;Energy conservation;Taping;Manual techniques    PT Next Visit Plan  Discharge    PT Home Exercise Plan  see medbridge access code in note; LAQ, seated marching, supine/sidelying hip extension, use of RW to aide in transfers    Consulted and Agree with Plan of Care  Patient;Family member/caregiver    Family Member Consulted  daughter, [name]       Patient will benefit from skilled therapeutic intervention in order to improve the following deficits and impairments:  Abnormal gait, Decreased activity tolerance, Decreased endurance, Decreased strength, Pain, Difficulty walking, Decreased mobility, Decreased balance, Decreased knowledge of use of DME  Visit Diagnosis: 1. Hx of AKA (above knee amputation), left ([place])   2. Muscle weakness (generalized)   3. Gait difficulty   4. Other abnormalities of gait and mobility        Problem List Patient Active  Problem List   Diagnosis Date Noted  . Anxiety and depression 12/16/2018  . Hx of AKA (above knee amputation), left ([place]) 08/26/2018  . Left arm swelling 07/01/2018  . Carotid stenosis 12/19/2017  . Hand cramp 10/17/2017  . Acute non-recurrent maxillary sinusitis 10/17/2017  . Skin lesion 09/29/2017  . Diarrhea 04/23/2017  . Weight loss 09/12/2016  . CKD stage 3 due to type 2 diabetes mellitus ([place]) 08/14/2016  . PAD (peripheral artery disease) ([place]) 08/14/2016  . DM (diabetes mellitus) ([place]) 03/25/2016  . Anemia 03/25/2016  . Chronic tension-type headache, not intractable 03/25/2016  . Essential hypertension 03/25/2016  . CAD (coronary artery disease), native coronary artery 03/25/2016  . GERD (gastroesophageal reflux disease) 03/25/2016  . Hyperlipidemia 03/25/2016  . History of breast cancer 03/25/2016   Pura Spice, PT, DPT # [id number] 12/17/2018, 9:11 PM  [facility] [address] [place], [place], [address] Phone: [phone]   Fax:  [phone]  Name: Tiffany Bailey MRN: [id number] Date of Birth: August 24, 1927

## 2018-12-28 ENCOUNTER — Ambulatory Visit: Payer: Self-pay

## 2018-12-28 NOTE — Telephone Encounter (Signed)
Call pt  4 days for an appt is TOO Long.  Concern she may need abdominal imaging ( CT or XR)  Please see if [facility] has any openings today/ early tomorrow

## 2018-12-28 NOTE — Telephone Encounter (Signed)
FYI  Seeing you tomorrow  Tiffany Bailey spoke with her 20 minutes ago and she stated no abdominal pain at this time. FYI  Thank you for seeing her!

## 2018-12-28 NOTE — Telephone Encounter (Signed)
Pt. Called to report poor appetite over past month, and increased overall weakness the past 2 weeks.  Stated she has intermittent nausea.  Denied vomiting.  Denied fever or chills.  Reported constipation.  Stated last good BM was about a week ago.  Has to take laxatives, at times, to have good BM.  Reported small stool yesterday.  Reported intermittent abdominal bloating; denied abdominal bloating at present time.  C/o intermittent right lower quadrant abdominal pain x 2 days.  Denied abdominal pain at present.  Reported she was able to eat a little better yesterday, and does not feel as weak today, as she has been feeling.  Questioned about fluid intake; drinks Soda, Coffee and about 3-4 glasses water/ day.  Denied dizziness.  Stated she is urinating in normal amts.  Also reported swelling in the toes of right foot, and intermittent right leg pain.  Hx of left BKA.  Reported she has an appt. with Vascular doctor on 7/21.  Advised pt. Will need to schedule appt. With PCP for weakness and poor appetite.  Transferred to Scheduler to have appt. Made with PCP.  Pt. agreed with plan.    Reason for Disposition . [1] MODERATE weakness (i.e., interferes with work, school, normal activities) AND [2] persists > 3 days  Answer Assessment - Initial Assessment Questions 1. DESCRIPTION: "Describe how you are feeling."     Feels weak ; poor appetite  2. SEVERITY: "How bad is it?"  "Can you stand and walk?"   - MILD - Feels weak or tired, but does not interfere with work, school or normal activities   - [place] to stand and walk; weakness interferes with work, school, or normal activities   - SEVERE - Unable to stand or walk    Moderate (improved over past 2 days)  3. ONSET:  "When did the weakness begin?"     2 weeks 4. CAUSE: "What do you think is causing the weakness?"     Poor appetite; not much nourishment  5. MEDICINES: "Have you recently started a new medicine or had a change in the amount of a  medicine?"     Medication for depression 6. OTHER SYMPTOMS: "Do you have any other symptoms?" (e.g., chest pain, fever, cough, SOB, vomiting, diarrhea, bleeding, other areas of pain)     Nausea, intermittent right lower quad abdominal pain x 2 days, intermittent constipation; thinks last good BM was about a week ago.  7. PREGNANCY: "Is there any chance you are pregnant?" "When was your last menstrual period?"     N/a  Protocols used: WEAKNESS (GENERALIZED) AND FATIGUE-A-AH

## 2018-12-28 NOTE — Telephone Encounter (Signed)
Appointment has been made with lauren tomorrow.

## 2018-12-29 ENCOUNTER — Other Ambulatory Visit: Payer: Self-pay

## 2018-12-29 ENCOUNTER — Ambulatory Visit (INDEPENDENT_AMBULATORY_CARE_PROVIDER_SITE_OTHER): Payer: Medicare HMO | Admitting: Family Medicine

## 2018-12-29 DIAGNOSIS — R630 Anorexia: Secondary | ICD-10-CM

## 2018-12-29 DIAGNOSIS — R531 Weakness: Secondary | ICD-10-CM

## 2018-12-29 NOTE — Progress Notes (Signed)
Patient ID: Tiffany SHERBURNE, female   DOB: 1928-04-09, 85 y.o.   MRN: [id number]    Virtual Visit via phone Note  This visit type was conducted due to national recommendations for restrictions regarding the COVID-19 pandemic (e.g. social distancing).  This format is felt to be most appropriate for this patient at this time.  All issues noted in this document were discussed and addressed.  No physical exam was performed (except for noted visual exam findings with Video Visits).   I connected with Tiffany Bailey today at 10:20 AM EDT by telephone and verified that I am speaking with the correct person using two identifiers. Location patient: home Location provider: work or home office Persons participating in the virtual visit: patient, provider  I discussed the limitations, risks, security and privacy concerns of performing an evaluation and management service by telephone and the availability of in person appointments. I also discussed with the patient that there may be a patient responsible charge related to this service. The patient expressed understanding and agreed to proceed.  HPI:  Patient and I connected via telephone to discuss symptoms of weakness, decreased appetite and reports of abdominal pain and constipation as written by triage nurse yesterday.  Patient states she has felt weak and not wanting to eat much off and on for the past few weeks.  Denies any abdominal pain and states she had a good bowel movement yesterday after speaking with staff here.  Denies any nausea, vomiting or diarrhea.  States she ate [place] food for lunch, then cooked herself a good dinner of green beans, potatoes and pork chops which she enjoyed.  States for breakfast this morning she had cream of wheat.  She has been keeping herself well-hydrated with at least 4 to 5 glasses of water a day, and also will drink coffee or tea and sometimes a soda.  States her daughter did buy her Ensure shakes and has been  encouraging her to drink them daily.  States that since eating and drinking better, weakness has resolved and also states since she ate a good meal yesterday her appetite seems to be restimulated and she is wanting to eat again.  Denies fever or chills.  Denies urinary issues.  Denies cough, shortness of breath or wheezing.  Denies chest pain.  Denies headache.   ROS: See pertinent positives and negatives per HPI.  Past Medical History:  Diagnosis Date  . Anemia   . Anginal pain ([place])   . Arthritis   . Cancer (HCC)    BREAST  . CKD (chronic kidney disease), stage III ([place])   . Coronary artery disease    S/P CABG and multiple PCI's  . Diabetes mellitus without complication ([place])   . Edema   . Failure to thrive in adult   . GERD (gastroesophageal reflux disease)   . History of breast cancer   . HOH (hard of hearing)   . Hyperlipidemia   . Hypertension   . Myocardial infarction ([place])   . Palpitations     Past Surgical History:  Procedure Laterality Date  . ABDOMINAL HYSTERECTOMY    . ABOVE KNEE LEG AMPUTATION Left 2013  . ANTERIOR VITRECTOMY Left 11/16/2015   Procedure: ANTERIOR VITRECTOMY;  Surgeon: Eulogio Bear, MD;  Location: [facility] ORS;  Service: Ophthalmology;  Laterality: Left;  . BLADDER SURGERY    . CATARACT EXTRACTION W/PHACO Left 11/16/2015   Procedure: CATARACT EXTRACTION PHACO AND INTRAOCULAR LENS PLACEMENT (IOC);  Surgeon: Eulogio Bear, MD;  Location: [facility] OR[facility];  Service: Ophthalmology;  Laterality: Left;  Lot # [id number] H [place]; 01:22.8 AP%:12.6 CDE: 10.46  . CATARACT EXTRACTION W/PHACO Right 08/22/2016   Procedure: CATARACT EXTRACTION PHACO AND INTRAOCULAR LENS PLACEMENT (IOC);  Surgeon: Eulogio Bear, MD;  Location: [facility] ORS;  Service: Ophthalmology;  Laterality: Right;  Lot # [id number] H [place]: 00:52.1 AP%:8.7 CDE: 4.99   . CORONARY ANGIOPLASTY     STENT  . CORONARY ARTERY BYPASS GRAFT    . MASTECTOMY    . TUMOR REMOVAL     ABDOMINAL    Family History   Problem Relation Age of Onset  . Sudden death Mother   . Arthritis Father   . Stroke Father   . Breast cancer Sister   . Diabetes Grandchild    Social History   Tobacco Use  . Smoking status: Never Smoker  . Smokeless tobacco: Never Used  Substance Use Topics  . Alcohol use: No    Current Outpatient Medications:  .  acetaminophen (TYLENOL) 500 MG tablet, Take 1 tablet (500 mg total) by mouth every 6 (six) hours as needed., Disp: 30 tablet, Rfl: 0 .  amLODipine (NORVASC) 10 MG tablet, Take 1 tablet (10 mg total) by mouth daily., Disp: 90 tablet, Rfl: 3 .  aspirin EC 81 MG tablet, Take 1 tablet (81 mg total) by mouth daily., Disp: 30 tablet, Rfl: 5 .  atorvastatin (LIPITOR) 40 MG tablet, Take 1 tablet (40 mg total) by mouth daily., Disp: 90 tablet, Rfl: 1 .  blood glucose meter kit and supplies KIT, Dispense based on patient and insurance preference. Use up to four times daily as directed. (FOR ICD-9 250.00, 250.01)., Disp: 1 each, Rfl: 0 .  clopidogrel (PLAVIX) 75 MG tablet, Take 1 tablet (75 mg total) by mouth daily., Disp: 90 tablet, Rfl: 3 .  cyclobenzaprine (FLEXERIL) 5 MG tablet, Take 1 tablet (5 mg total) by mouth at bedtime as needed for muscle spasms., Disp: 30 tablet, Rfl: 0 .  ezetimibe (ZETIA) 10 MG tablet, Take 1 tablet (10 mg total) by mouth daily., Disp: 90 tablet, Rfl: 3 .  famotidine (PEPCID) 10 MG tablet, Take 10 mg by mouth 2 (two) times daily., Disp: , Rfl:  .  fluticasone (FLONASE) 50 MCG/ACT nasal spray, Place 1 spray into both nostrils daily., Disp: 16 g, Rfl: 5 .  glucose blood (ONE TOUCH TEST STRIPS) test strip, Use to test blood sugar twice daily, Disp: 200 each, Rfl: 3 .  Insulin Glargine, 1 Unit Dial, (TOUJEO SOLOSTAR) 300 UNIT/ML SOPN, Inject into the skin. U-300 5 units qhs per Dr. Gabriel Carina, Disp: , Rfl:  .  Insulin Pen Needle (BD PEN NEEDLE NANO U/F) 32G X 4 MM MISC, USE 1 DAILY, Disp: 100 each, Rfl: 12 .  levocetirizine (XYZAL) 5 MG tablet, Take 1 tablet (5  mg total) by mouth every evening., Disp: 30 tablet, Rfl: 1 .  loratadine (CLARITIN) 10 MG tablet, Take 1 tablet (10 mg total) by mouth daily., Disp: 30 tablet, Rfl: 2 .  metoprolol tartrate (LOPRESSOR) 25 MG tablet, Take 1 tablet (25 mg total) by mouth 2 (two) times daily., Disp: 180 tablet, Rfl: 0 .  nitroGLYCERIN (NITROSTAT) 0.4 MG SL tablet, Place 1 tablet (0.4 mg total) under the tongue every 5 (five) minutes as needed for chest pain., Disp: 25 tablet, Rfl: 4 .  omega-3 acid ethyl esters (LOVAZA) 1 g capsule, Take by mouth daily., Disp: , Rfl:  .  ONE TOUCH LANCETS MISC, Use as directed 2 times  per day, Disp: 200 each, Rfl: 3 .  sertraline (ZOLOFT) 50 MG tablet, Take 1 tablet (50 mg total) by mouth at bedtime., Disp: 90 tablet, Rfl: 3  EXAM:  GENERAL: alert, oriented, sounds well and in no acute distress  LUNGS: on inspection no signs of respiratory distress, breathing rate appears normal, no obvious gross SOB, gasping or wheezing  PSYCH/NEURO: pleasant and cooperative, no obvious depression or anxiety, speech and thought processing grossly intact  ASSESSMENT AND PLAN:  Discussed the following assessment and plan:  Decreased appetite/generalized weakness-patient was feeling this way off and on for about a month however since she has started eating various foods again this has resolved.  She a good meals yesterday and has been keeping self well-hydrated.  Since eating good meals yesterday, feels her appetite is restimulated and she is wanting to eat again.  Discussed eating various foods to keep diet interesting, discussed changing of the meats from chicken to pork chops to beef and eating lots of different vegetables or fruits.  Also encouraged to keep up good fluid intake with water, Gatorade, coffee or tea on occasion.  Patient and I also discussed her drinking an Ensure or a boost type shake once a day to help keep up good protein intake and be sure that she gets all of her nutrients each  day.  Patient states she did enjoy the Ensure shakes her daughter bought her, and plans to start drinking these every day.   I discussed the assessment and treatment plan with the patient. The patient was provided an opportunity to ask questions and all were answered. The patient agreed with the plan and demonstrated an understanding of the instructions.   The patient was advised to call back or seek an in-person evaluation if the symptoms worsen or if the condition fails to improve as anticipated.  I provided 14 minutes of non-face-to-face time over phone during this encounter.   Jodelle Green, FNP

## 2019-01-01 ENCOUNTER — Ambulatory Visit: Payer: Medicare HMO | Admitting: Family

## 2019-01-05 ENCOUNTER — Ambulatory Visit (INDEPENDENT_AMBULATORY_CARE_PROVIDER_SITE_OTHER): Payer: Medicare HMO | Admitting: Vascular Surgery

## 2019-01-05 ENCOUNTER — Encounter (INDEPENDENT_AMBULATORY_CARE_PROVIDER_SITE_OTHER): Payer: Self-pay | Admitting: Vascular Surgery

## 2019-01-05 ENCOUNTER — Other Ambulatory Visit: Payer: Self-pay

## 2019-01-05 VITALS — BP 142/61 | HR 67 | Resp 16 | Ht 60.0 in | Wt 85.0 lb

## 2019-01-05 DIAGNOSIS — E785 Hyperlipidemia, unspecified: Secondary | ICD-10-CM

## 2019-01-05 DIAGNOSIS — Z79899 Other long term (current) drug therapy: Secondary | ICD-10-CM

## 2019-01-05 DIAGNOSIS — E1159 Type 2 diabetes mellitus with other circulatory complications: Secondary | ICD-10-CM | POA: Diagnosis not present

## 2019-01-05 DIAGNOSIS — I739 Peripheral vascular disease, unspecified: Secondary | ICD-10-CM | POA: Diagnosis not present

## 2019-01-05 DIAGNOSIS — C801 Malignant (primary) neoplasm, unspecified: Secondary | ICD-10-CM | POA: Insufficient documentation

## 2019-01-05 DIAGNOSIS — E1122 Type 2 diabetes mellitus with diabetic chronic kidney disease: Secondary | ICD-10-CM

## 2019-01-05 DIAGNOSIS — Z89612 Acquired absence of left leg above knee: Secondary | ICD-10-CM

## 2019-01-05 DIAGNOSIS — N183 Chronic kidney disease, stage 3 unspecified: Secondary | ICD-10-CM

## 2019-01-05 DIAGNOSIS — C50919 Malignant neoplasm of unspecified site of unspecified female breast: Secondary | ICD-10-CM | POA: Insufficient documentation

## 2019-01-05 DIAGNOSIS — I10 Essential (primary) hypertension: Secondary | ICD-10-CM

## 2019-01-05 DIAGNOSIS — I129 Hypertensive chronic kidney disease with stage 1 through stage 4 chronic kidney disease, or unspecified chronic kidney disease: Secondary | ICD-10-CM

## 2019-01-05 DIAGNOSIS — Z794 Long term (current) use of insulin: Secondary | ICD-10-CM

## 2019-01-05 NOTE — Progress Notes (Signed)
Patient ID: Tiffany Bailey, female   DOB: [birth year], 85 y.o.   MRN: [id number]  Chief Complaint  Patient presents with   New Patient (Initial Visit)    swelling of toes right foot    HPI Tiffany Bailey is a 85 y.o. female.  I am asked to see the patient by M. [name], FNP for evaluation of PAD and right foot pain.  The patient has some pressure area on her right heel although there is not an open wound at current.  It is very painful and pain there and in her toes will wake her up at night.  She is not really ambulatory anymore after a left above-knee amputation over 5 years ago.  No fever or chills.  The pain in her foot has gradually progressed over weeks to months.  There is no clear inciting event or causative factor that started the symptoms.  The patient denies any trauma or injury to the leg.  Nothing has really made it better.  She still has some phantom pain in the left stump pain years after her amputation.  The pain is associated with some swelling down her foot and toes.  There is also been some discoloration of a purplish color of her toes.     Past Medical History:  Diagnosis Date   Anemia    Anginal pain ([place])    Arthritis    Cancer ([place])    BREAST   CKD (chronic kidney disease), stage III ([place])    Coronary artery disease    S/P CABG and multiple PCI's   Diabetes mellitus without complication ([place])    Edema    Failure to thrive in adult    GERD (gastroesophageal reflux disease)    Hb-SS disease without crisis ([place]) 10/23/2017   History of breast cancer    HOH (hard of hearing)    Hyperlipidemia    Hypertension    Myocardial infarction [facility])    Palpitations     Past Surgical History:  Procedure Laterality Date   ABDOMINAL HYSTERECTOMY     ABOVE KNEE LEG AMPUTATION Left 2013   ANTERIOR VITRECTOMY Left 11/16/2015   Procedure: ANTERIOR VITRECTOMY;  Surgeon: Eulogio Bear, MD;  Location: [facility] ORS;  Service: Ophthalmology;  Laterality: Left;     BLADDER SURGERY     CATARACT EXTRACTION W/PHACO Left 11/16/2015   Procedure: CATARACT EXTRACTION PHACO AND INTRAOCULAR LENS PLACEMENT (IOC);  Surgeon: Eulogio Bear, MD;  Location: [facility] ORS;  Service: Ophthalmology;  Laterality: Left;  Lot # [id number] H [place]; 01:22.8 AP%:12.6 CDE: 10.46   CATARACT EXTRACTION W/PHACO Right 08/22/2016   Procedure: CATARACT EXTRACTION PHACO AND INTRAOCULAR LENS PLACEMENT (IOC);  Surgeon: Eulogio Bear, MD;  Location: [facility] ORS;  Service: Ophthalmology;  Laterality: Right;  Lot # [id number] H [place]: 00:52.1 AP%:8.7 CDE: 4.99    CORONARY ANGIOPLASTY     STENT   CORONARY ARTERY BYPASS GRAFT     MASTECTOMY     TUMOR REMOVAL     ABDOMINAL    Family History Family History  Problem Relation Age of Onset   Sudden death Mother    Arthritis Father    Stroke Father    Breast cancer Sister    Diabetes Grandchild     Social History Social History   Tobacco Use   Smoking status: Never Smoker   Smokeless tobacco: Never Used  Substance Use Topics   Alcohol use: No   Drug use: No    Allergies  Allergen Reactions   Amoxicillin Hives and Nausea Only   Gabapentin Other (See Comments)    AMS Confusion   Mirtazapine Itching    Dry mouth with ODT Denies SOB with medication   Other     UNKNOWN PAIN MEDICATION - unknown reaction    Current Outpatient Medications  Medication Sig Dispense Refill   acetaminophen (TYLENOL) 500 MG tablet Take 1 tablet (500 mg total) by mouth every 6 (six) hours as needed. 30 tablet 0   amLODipine (NORVASC) 10 MG tablet Take 1 tablet (10 mg total) by mouth daily. 90 tablet 3   aspirin EC 81 MG tablet Take 1 tablet (81 mg total) by mouth daily. 30 tablet 5   atorvastatin (LIPITOR) 40 MG tablet Take 1 tablet (40 mg total) by mouth daily. 90 tablet 1   blood glucose meter kit and supplies KIT Dispense based on patient and insurance preference. Use up to four times daily as directed. (FOR ICD-9 250.00,  250.01). 1 each 0   clopidogrel (PLAVIX) 75 MG tablet Take 1 tablet (75 mg total) by mouth daily. 90 tablet 3   cyclobenzaprine (FLEXERIL) 5 MG tablet Take 1 tablet (5 mg total) by mouth at bedtime as needed for muscle spasms. 30 tablet 0   ezetimibe (ZETIA) 10 MG tablet Take 1 tablet (10 mg total) by mouth daily. 90 tablet 3   famotidine (PEPCID) 10 MG tablet Take 10 mg by mouth 2 (two) times daily.     fluticasone (FLONASE) 50 MCG/ACT nasal spray Place 1 spray into both nostrils daily. 16 g 5   glucose blood (ONE TOUCH TEST STRIPS) test strip Use to test blood sugar twice daily 200 each 3   Insulin Glargine, 1 Unit Dial, (TOUJEO SOLOSTAR) 300 UNIT/ML SOPN Inject into the skin. U-300 5 units qhs per Dr. Gabriel Carina     Insulin Pen Needle (BD PEN NEEDLE NANO U/F) 32G X 4 MM MISC USE 1 DAILY 100 each 12   levocetirizine (XYZAL) 5 MG tablet Take 1 tablet (5 mg total) by mouth every evening. 30 tablet 1   loratadine (CLARITIN) 10 MG tablet Take 1 tablet (10 mg total) by mouth daily. 30 tablet 2   metoprolol tartrate (LOPRESSOR) 25 MG tablet Take 1 tablet (25 mg total) by mouth 2 (two) times daily. 180 tablet 0   nitroGLYCERIN (NITROSTAT) 0.4 MG SL tablet Place 1 tablet (0.4 mg total) under the tongue every 5 (five) minutes as needed for chest pain. 25 tablet 4   omega-3 acid ethyl esters (LOVAZA) 1 g capsule Take by mouth daily.     ONE TOUCH LANCETS MISC Use as directed 2 times per day 200 each 3   sertraline (ZOLOFT) 50 MG tablet Take 1 tablet (50 mg total) by mouth at bedtime. 90 tablet 3   No current facility-administered medications for this visit.       REVIEW OF SYSTEMS (Negative unless checked)  Constitutional: []Weight loss  []Fever  []Chills Cardiac: []Chest pain   []Chest pressure   []Palpitations   []Shortness of breath when laying flat   []Shortness of breath at rest   []Shortness of breath with exertion. Vascular:  []Pain in legs with walking   []Pain in legs at rest    []Pain in legs when laying flat   []Claudication   []Pain in feet when walking  [x]Pain in feet at rest  [x]Pain in feet when laying flat   []History of DVT   []Phlebitis   [x]Swelling in legs   []  Varicose veins   []Non-healing ulcers Pulmonary:   []Uses home oxygen   []Productive cough   []Hemoptysis   []Wheeze  []COPD   []Asthma Neurologic:  []Dizziness  []Blackouts   []Seizures   []History of stroke   []History of TIA  []Aphasia   []Temporary blindness   []Dysphagia   []Weakness or numbness in arms   []Weakness or numbness in legs Musculoskeletal:  [x]Arthritis   []Joint swelling   []Joint pain   []Low back pain Hematologic:  []Easy bruising  []Easy bleeding   []Hypercoagulable state   [x]Anemic  []Hepatitis Gastrointestinal:  []Blood in stool   []Vomiting blood  [x]Gastroesophageal reflux/heartburn   []Abdominal pain Genitourinary:  [x]Chronic kidney disease   []Difficult urination  []Frequent urination  []Burning with urination   []Hematuria Skin:  []Rashes   []Ulcers   []Wounds Psychological:  []History of anxiety   [] History of major depression.    Physical Exam BP (!) 142/61 (BP Location: Right Arm)    Pulse 67    Resp 16    Ht 5' (1.524 m)    Wt 85 lb (38.6 kg)    BMI 16.60 kg/m  Gen:  Thin, elderly female, NAD Head: [place]/AT, No temporalis wasting.  Ear/Nose/Throat: Hearing diminished, nares w/o erythema or drainage, oropharynx w/o Erythema/Exudate Eyes: Conjunctiva clear, sclera non-icteric  Neck: trachea midline.  No JVD.  Pulmonary:  Good air movement, respirations not labored, no use of accessory muscles  Cardiac: somewhat irregular Vascular:  Vessel Right Left  Radial Palpable Palpable                          DP 2+ NP  PT NP NP   Gastrointestinal:. No masses, surgical incisions, or scars. Musculoskeletal: M/S 5/5 throughout. Well healed left AKA.  No deformity or atrophy.  Mild discoloration of the toes.  No open wounds or ulcers.  No edema.  Uses a  wheelchair. Neurologic: Sensation grossly intact in extremities.  Symmetrical.  Speech is fluent. Motor exam as listed above. Psychiatric: Judgment intact, Mood & affect appropriate for pt's clinical situation. Dermatologic: No rashes or ulcers noted.  No cellulitis or open wounds.    Radiology No results found.  Labs Recent Results (from the past 2160 hour(s))  HM DIABETES EYE EXAM     Status: None   Collection Time: 11/18/18 12:00 AM  Result Value Ref Range   HM Diabetic Eye Exam No Retinopathy No Retinopathy    Comment: [place]     Assessment/Plan:  Hyperlipidemia lipid control important in reducing the progression of atherosclerotic disease. Continue statin therapy   DM (diabetes mellitus) (HCC) blood glucose control important in reducing the progression of atherosclerotic disease. Also, involved in wound healing. On appropriate medications.  Neuropathy from diabetes could be contributing to some of her lower extremity symptoms.   CKD stage 3 due to type 2 diabetes mellitus ([place]) Plan on work-up with noninvasive studies and limit contrast unless this is clearly a limb threatening situation.  Essential hypertension blood pressure control important in reducing the progression of atherosclerotic disease. On appropriate oral medications.   PAD (peripheral artery disease) (HCC) Status post left AKA.  Right foot symptoms worrisome for ischemic rest pain.  Recommend evaluation with noninvasive studies of the right leg in the very near future at her convenience.  These are scheduled for later this week.  We will see her back after the studies to discuss the results and determine further  treatment options      Leotis Pain 01/05/2019, 2:32 PM   This note was created with Dragon medical transcription system.  Any errors from dictation are unintentional.

## 2019-01-05 NOTE — Assessment & Plan Note (Signed)
lipid control important in reducing the progression of atherosclerotic disease. Continue statin therapy  

## 2019-01-05 NOTE — Assessment & Plan Note (Signed)
Plan on work-up with noninvasive studies and limit contrast unless this is clearly a limb threatening situation.

## 2019-01-05 NOTE — Assessment & Plan Note (Signed)
Status post left AKA.  Right foot symptoms worrisome for ischemic rest pain.  Recommend evaluation with noninvasive studies of the right leg in the very near future at her convenience.  These are scheduled for later this week.  We will see her back after the studies to discuss the results and determine further treatment options

## 2019-01-05 NOTE — Assessment & Plan Note (Signed)
blood glucose control important in reducing the progression of atherosclerotic disease. Also, involved in wound healing. On appropriate medications.  Neuropathy from diabetes could be contributing to some of her lower extremity symptoms.

## 2019-01-05 NOTE — Patient Instructions (Signed)
Peripheral Vascular Disease  Peripheral vascular disease (PVD) is a disease of the blood vessels that are not part of your heart and brain. A simple term for PVD is poor circulation. In most cases, PVD narrows the blood vessels that carry blood from your heart to the rest of your body. This can reduce the supply of blood to your arms, legs, and internal organs, like your stomach or kidneys. However, PVD most often affects a person's lower legs and feet. Without treatment, PVD tends to get worse. PVD can also lead to acute ischemic limb. This is when an arm or leg suddenly cannot get enough blood. This is a medical emergency. Follow these instructions at home: Lifestyle  Do not use any products that contain nicotine or tobacco, such as cigarettes and e-cigarettes. If you need help quitting, ask your doctor.  Lose weight if you are overweight. Or, stay at a healthy weight as told by your doctor.  Eat a diet that is low in fat and cholesterol. If you need help, ask your doctor.  Exercise regularly. Ask your doctor for activities that are right for you. General instructions  Take over-the-counter and prescription medicines only as told by your doctor.  Take good care of your feet: ? Wear comfortable shoes that fit well. ? Check your feet often for any cuts or sores.  Keep all follow-up visits as told by your doctor This is important. Contact a doctor if:  You have cramps in your legs when you walk.  You have leg pain when you are at rest.  You have coldness in a leg or foot.  Your skin changes.  You are unable to get or have an erection (erectile dysfunction).  You have cuts or sores on your feet that do not heal. Get help right away if:  Your arm or leg turns cold, numb, and blue.  Your arms or legs become red, warm, swollen, painful, or numb.  You have chest pain.  You have trouble breathing.  You suddenly have weakness in your face, arm, or leg.  You become very  confused or you cannot speak.  You suddenly have a very bad headache.  You suddenly cannot see. Summary  Peripheral vascular disease (PVD) is a disease of the blood vessels.  A simple term for PVD is poor circulation. Without treatment, PVD tends to get worse.  Treatment may include exercise, low fat and low cholesterol diet, and quitting smoking. This information is not intended to replace advice given to you by your health care provider. Make sure you discuss any questions you have with your health care provider. Document Released: 08/28/2009 Document Revised: 05/16/2017 Document Reviewed: 07/11/2016 Elsevier Patient Education  2020 [employer]  

## 2019-01-05 NOTE — Assessment & Plan Note (Signed)
blood pressure control important in reducing the progression of atherosclerotic disease. On appropriate oral medications.  

## 2019-01-07 ENCOUNTER — Encounter (INDEPENDENT_AMBULATORY_CARE_PROVIDER_SITE_OTHER): Payer: Self-pay

## 2019-01-07 ENCOUNTER — Other Ambulatory Visit: Payer: Self-pay

## 2019-01-07 ENCOUNTER — Encounter: Payer: Self-pay | Admitting: Emergency Medicine

## 2019-01-07 ENCOUNTER — Ambulatory Visit (INDEPENDENT_AMBULATORY_CARE_PROVIDER_SITE_OTHER): Payer: Medicare HMO

## 2019-01-07 ENCOUNTER — Emergency Department
Admission: EM | Admit: 2019-01-07 | Discharge: 2019-01-07 | Disposition: A | Discharge status: Home / Self Care | Payer: Medicare HMO | Attending: Emergency Medicine | Admitting: Emergency Medicine

## 2019-01-07 DIAGNOSIS — Z951 Presence of aortocoronary bypass graft: Secondary | ICD-10-CM | POA: Diagnosis not present

## 2019-01-07 DIAGNOSIS — N183 Chronic kidney disease, stage 3 (moderate): Secondary | ICD-10-CM | POA: Diagnosis not present

## 2019-01-07 DIAGNOSIS — Z89512 Acquired absence of left leg below knee: Secondary | ICD-10-CM

## 2019-01-07 DIAGNOSIS — Z79899 Other long term (current) drug therapy: Secondary | ICD-10-CM | POA: Insufficient documentation

## 2019-01-07 DIAGNOSIS — I130 Hypertensive heart and chronic kidney disease with heart failure and stage 1 through stage 4 chronic kidney disease, or unspecified chronic kidney disease: Secondary | ICD-10-CM | POA: Diagnosis not present

## 2019-01-07 DIAGNOSIS — I2510 Atherosclerotic heart disease of native coronary artery without angina pectoris: Secondary | ICD-10-CM | POA: Insufficient documentation

## 2019-01-07 DIAGNOSIS — Z7982 Long term (current) use of aspirin: Secondary | ICD-10-CM | POA: Diagnosis not present

## 2019-01-07 DIAGNOSIS — Z89612 Acquired absence of left leg above knee: Secondary | ICD-10-CM | POA: Insufficient documentation

## 2019-01-07 DIAGNOSIS — I739 Peripheral vascular disease, unspecified: Secondary | ICD-10-CM | POA: Diagnosis not present

## 2019-01-07 DIAGNOSIS — R51 Headache: Secondary | ICD-10-CM | POA: Insufficient documentation

## 2019-01-07 DIAGNOSIS — I10 Essential (primary) hypertension: Secondary | ICD-10-CM

## 2019-01-07 DIAGNOSIS — I252 Old myocardial infarction: Secondary | ICD-10-CM | POA: Diagnosis not present

## 2019-01-07 DIAGNOSIS — Z7902 Long term (current) use of antithrombotics/antiplatelets: Secondary | ICD-10-CM | POA: Diagnosis not present

## 2019-01-07 DIAGNOSIS — Z853 Personal history of malignant neoplasm of breast: Secondary | ICD-10-CM | POA: Insufficient documentation

## 2019-01-07 DIAGNOSIS — R519 Headache, unspecified: Secondary | ICD-10-CM

## 2019-01-07 DIAGNOSIS — I509 Heart failure, unspecified: Secondary | ICD-10-CM | POA: Insufficient documentation

## 2019-01-07 LAB — COMPREHENSIVE METABOLIC PANEL
ALT: 25 U/L (ref 0–44)
AST: 28 U/L (ref 15–41)
Albumin: 4.2 g/dL (ref 3.5–5.0)
Alkaline Phosphatase: 128 U/L — ABNORMAL HIGH (ref 38–126)
Anion gap: 10 (ref 5–15)
BUN: 19 mg/dL (ref 8–23)
CO2: 23 mmol/L (ref 22–32)
Calcium: 9.2 mg/dL (ref 8.9–10.3)
Chloride: 100 mmol/L (ref 98–111)
Creatinine, Ser: 1.1 mg/dL — ABNORMAL HIGH (ref 0.44–1.00)
GFR calc Af Amer: 52 mL/min — ABNORMAL LOW (ref >60)
GFR calc non Af Amer: 45 mL/min — ABNORMAL LOW (ref >60)
Glucose, Bld: 321 mg/dL — ABNORMAL HIGH (ref 70–99)
Potassium: 4.1 mmol/L (ref 3.5–5.1)
Sodium: 133 mmol/L — ABNORMAL LOW (ref 135–145)
Total Bilirubin: 0.8 mg/dL (ref 0.3–1.2)
Total Protein: 7.6 g/dL (ref 6.5–8.1)

## 2019-01-07 LAB — CBC WITH DIFFERENTIAL/PLATELET
Abs Immature Granulocytes: 0.02 10*3/uL (ref 0.00–0.07)
Basophils Absolute: 0 10*3/uL (ref 0.0–0.1)
Basophils Relative: 1 %
Eosinophils Absolute: 0.1 10*3/uL (ref 0.0–0.5)
Eosinophils Relative: 1 %
HCT: 33.6 % — ABNORMAL LOW (ref 36.0–46.0)
Hemoglobin: 11.2 g/dL — ABNORMAL LOW (ref 12.0–15.0)
Immature Granulocytes: 0 %
Lymphocytes Relative: 15 %
Lymphs Abs: 1.1 10*3/uL (ref 0.7–4.0)
MCH: 30.5 pg (ref 26.0–34.0)
MCHC: 33.3 g/dL (ref 30.0–36.0)
MCV: 91.6 fL (ref 80.0–100.0)
Monocytes Absolute: 0.5 10*3/uL (ref 0.1–1.0)
Monocytes Relative: 7 %
Neutro Abs: 5.3 10*3/uL (ref 1.7–7.7)
Neutrophils Relative %: 76 %
Platelets: 312 10*3/uL (ref 150–400)
RBC: 3.67 MIL/uL — ABNORMAL LOW (ref 3.87–5.11)
RDW: 13.5 % (ref 11.5–15.5)
WBC: 7 10*3/uL (ref 4.0–10.5)
nRBC: 0 % (ref 0.0–0.2)

## 2019-01-07 LAB — SEDIMENTATION RATE: Sed Rate: 9 mm/hr (ref 0–30)

## 2019-01-07 MED ORDER — SODIUM CHLORIDE 0.9 % IV BOLUS
500.0000 mL | Freq: Once | INTRAVENOUS | Status: AC
  Administered 2019-01-07: 500 mL via INTRAVENOUS

## 2019-01-07 MED ORDER — LABETALOL HCL 5 MG/ML IV SOLN: 20.0000 mg | Freq: Once | INTRAVENOUS | Status: DC

## 2019-01-07 MED ORDER — LABETALOL HCL 5 MG/ML IV SOLN
10.0000 mg | Freq: Once | INTRAVENOUS | Status: AC
  Administered 2019-01-07: 10 mg via INTRAVENOUS
  Filled 2019-01-07: qty 4

## 2019-01-07 NOTE — ED Triage Notes (Signed)
Says she has left sided headache for years, but last few days it is worse and not releived by tylenol.  Says it feels better if she breaths through warm washcloth.  Says it makes her right eye water.

## 2019-01-07 NOTE — ED Notes (Signed)

## 2019-01-07 NOTE — Discharge Instructions (Signed)
Your blood tests were all okay today.  Continue take your blood pressure medicine and follow-up with your doctor tomorrow to schedule an appointment to recheck your blood pressure and adjust your blood pressure medicine as needed.  You should consider scheduling an appointment with neurology for your chronic headaches.

## 2019-01-07 NOTE — ED Provider Notes (Signed)
[facility] Emergency Department Provider Note  ____________________________________________  Time seen: Approximately 7:28 PM  I have reviewed the triage vital signs and the nursing notes.   HISTORY  Chief Complaint Headache    HPI Tiffany Bailey is a 85 y.o. female with a history of CKD, diabetes, hypertension, hyperlipidemia, chronic headaches for the past few years who states that her headache has been worse in the last 2 days.  Gradual progression, no aggravating or alleviating factors.  No associated vision change, jaw claudication, paresthesias or weakness.  No trauma or falls or syncope.  No fevers chills or other concerns.  She reports that she takes Tylenol frequently, sometimes multiple times a day for managing of these near daily headaches.      Past Medical History:  Diagnosis Date  . Anemia   . Anginal pain ([place])   . Arthritis   . Cancer (HCC)    BREAST  . CKD (chronic kidney disease), stage III ([place])   . Coronary artery disease    S/P CABG and multiple PCI's  . Diabetes mellitus without complication ([place])   . Edema   . Failure to thrive in adult   . GERD (gastroesophageal reflux disease)   . 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 History of breast cancer   . HOH (hard of hearing)   . Hyperlipidemia   . Hypertension   . Myocardial infarction ([place])   . Palpitations      Patient Active Problem List   Diagnosis Date Noted  . Cancer ([place]) 01/05/2019  . Anxiety and depression 12/16/2018  . Hx of AKA (above knee amputation), left ([place]) 08/26/2018  . Left arm swelling 07/01/2018  . Carotid stenosis 12/19/2017  . CAD (coronary artery disease) 10/23/2017  . 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 History of CHF (congestive heart failure) 10/23/2017  . Old myocardial infarction 10/23/2017  . Hand cramp 10/17/2017  . Acute non-recurrent maxillary sinusitis 10/17/2017  . Skin lesion 09/29/2017  . Diarrhea  04/23/2017  . Weight loss 09/12/2016  . CKD stage 3 due to type 2 diabetes mellitus ([place]) 08/14/2016  . PAD (peripheral artery disease) ([place]) 08/14/2016  . DM (diabetes mellitus) ([place]) 03/25/2016  . Anemia 03/25/2016  . Chronic tension-type headache, not intractable 03/25/2016  . Essential hypertension 03/25/2016  . CAD (coronary artery disease), native coronary artery 03/25/2016  . GERD (gastroesophageal reflux disease) 03/25/2016  . Hyperlipidemia 03/25/2016  . History of breast cancer 03/25/2016  . AKI (acute kidney injury) ([place]) 01/28/2016  . Hyperkalemia 01/28/2016  . Hyponatremia 01/28/2016  . Urinary retention 01/28/2016  . Hypertensive urgency 01/15/2016  . History of nonadherence to medical treatment 09/08/2015  . Epigastric pain 08/10/2014  . Ischemic heart disease due to coronary artery obstruction ([place]) 01/07/2013     Past Surgical History:  Procedure Laterality Date  . ABDOMINAL HYSTERECTOMY    . ABOVE KNEE LEG AMPUTATION Left 2013  . ANTERIOR VITRECTOMY Left 11/16/2015   Procedure: ANTERIOR VITRECTOMY;  Surgeon: Eulogio Bear, MD;  Location: [facility] ORS;  Service: Ophthalmology;  Laterality: Left;  . BLADDER SURGERY    . CATARACT EXTRACTION W/PHACO Left 11/16/2015   Procedure: CATARACT EXTRACTION PHACO AND INTRAOCULAR LENS PLACEMENT (IOC);  Surgeon: Eulogio Bear, MD;  Location: [facility] ORS;  Service: Ophthalmology;  Laterality: Left;  Lot # [id number] H [place]; 01:22.8 AP%:12.6 CDE: 10.46  . CATARACT EXTRACTION W/PHACO Right 08/22/2016   Procedure: CATARACT EXTRACTION PHACO AND INTRAOCULAR LENS PLACEMENT (IOC);  Surgeon: Leory Plowman  Alma Friendly, MD;  Location: [facility] ORS;  Service: Ophthalmology;  Laterality: Right;  Lot # [id number] H [place]: 00:52.1 AP%:8.7 CDE: 4.99   . CORONARY ANGIOPLASTY     STENT  . CORONARY ARTERY BYPASS GRAFT    . MASTECTOMY    . TUMOR REMOVAL     ABDOMINAL     Prior to Admission medications   Medication Sig Start Date End Date Taking? Authorizing  Provider  acetaminophen (TYLENOL) 500 MG tablet Take 1 tablet (500 mg total) by mouth every 6 (six) hours as needed. 01/15/17   [name], DO  amLODipine (NORVASC) 10 MG tablet Take 1 tablet (10 mg total) by mouth daily. 11/24/18   Minna Merritts, MD  aspirin EC 81 MG tablet Take 1 tablet (81 mg total) by mouth daily. 01/15/17   [name], DO  atorvastatin (LIPITOR) 40 MG tablet Take 1 tablet (40 mg total) by mouth daily. 07/08/18   [name], FNP  blood glucose meter kit and supplies KIT Dispense based on patient and insurance preference. Use up to four times daily as directed. (FOR ICD-9 250.00, 250.01). 01/13/17   [name], DO  clopidogrel (PLAVIX) 75 MG tablet Take 1 tablet (75 mg total) by mouth daily. 11/24/18   Minna Merritts, MD  cyclobenzaprine (FLEXERIL) 5 MG tablet Take 1 tablet (5 mg total) by mouth at bedtime as needed for muscle spasms. 08/26/18   [name], FNP  ezetimibe (ZETIA) 10 MG tablet Take 1 tablet (10 mg total) by mouth daily. 07/08/18   [name], FNP  famotidine (PEPCID) 10 MG tablet Take 10 mg by mouth 2 (two) times daily.    [provider]  fluticasone (FLONASE) 50 MCG/ACT nasal spray Place 1 spray into both nostrils daily. 07/08/18   [name], FNP  glucose blood (ONE TOUCH TEST STRIPS) test strip Use to test blood sugar twice daily 09/17/17   [name], FNP  Insulin Glargine, 1 Unit Dial, (TOUJEO SOLOSTAR) 300 UNIT/ML SOPN Inject into the skin. U-300 5 units qhs per Dr. Gabriel Carina    [provider]  Insulin Pen Needle (BD PEN NEEDLE NANO U/F) 32G X 4 MM MISC USE 1 DAILY 08/03/18   [name], FNP  levocetirizine (XYZAL) 5 MG tablet Take 1 tablet (5 mg total) by mouth every evening. 09/30/18   [name], FNP  loratadine (CLARITIN) 10 MG tablet Take 1 tablet (10 mg total) by mouth daily. 10/08/18   Jodelle Green, FNP  metoprolol tartrate (LOPRESSOR) 25 MG tablet Take 1 tablet (25 mg total) by  mouth 2 (two) times daily. 07/08/18   [name], FNP  nitroGLYCERIN (NITROSTAT) 0.4 MG SL tablet Place 1 tablet (0.4 mg total) under the tongue every 5 (five) minutes as needed for chest pain. 12/19/17   Minna Merritts, MD  omega-3 acid ethyl esters (LOVAZA) 1 g capsule Take by mouth daily.    [provider]  ONE TOUCH LANCETS MISC Use as directed 2 times per day 09/17/17   [name], FNP  sertraline (ZOLOFT) 50 MG tablet Take 1 tablet (50 mg total) by mouth at bedtime. 12/16/18   [name], FNP     Allergies Amoxicillin, Gabapentin, Mirtazapine, and Other   Family History  Problem Relation Age of Onset  . Sudden death Mother   . Arthritis Father   . Stroke Father   . Breast cancer Sister   . Diabetes  Grandchild     Social History Social History   Tobacco Use  . Smoking status: Never Smoker  . Smokeless tobacco: Never Used  Substance Use Topics  . Alcohol use: No  . Drug use: No    Review of Systems  Constitutional:   No fever or chills.  ENT:   No sore throat. No rhinorrhea. Cardiovascular:   No chest pain or syncope. Respiratory:   No dyspnea or cough. Gastrointestinal:   Negative for abdominal pain, vomiting and diarrhea.  Musculoskeletal:   Negative for focal pain or swelling All other systems reviewed and are negative except as documented above in ROS and HPI.  ____________________________________________   PHYSICAL EXAM:  VITAL SIGNS: ED Triage Vitals  Enc Vitals Group     BP 01/07/19 1350 (!) 192/62     Pulse Rate 01/07/19 1350 85     Resp 01/07/19 1350 16     Temp 01/07/19 1350 99.8 F (37.7 C)     Temp Source 01/07/19 1350 Oral     SpO2 01/07/19 1350 100 %     Weight --      Height --      Head Circumference --      Peak Flow --      Pain Score 01/07/19 1630 7     Pain Loc --      Pain Edu? --      Excl. in [place]? --     Vital signs reviewed, nursing assessments reviewed.   Constitutional:   Alert and  oriented. Non-toxic appearance. Eyes:   Conjunctivae are normal. EOMI. PERRL. ENT      Head:   Normocephalic and atraumatic.  Symmetric strong pulsations throughout course of temporal arteries bilaterally, nontender      Nose:   No congestion/rhinnorhea.       Mouth/Throat:   MMM, no pharyngeal erythema. No peritonsillar mass.       Neck:   No meningismus. Full ROM. Hematological/Lymphatic/Immunilogical:   No cervical lymphadenopathy. Cardiovascular:   RRR. Symmetric bilateral radial and DP pulses.  No murmurs. Cap refill less than 2 seconds. Respiratory:   Normal respiratory effort without tachypnea/retractions. Breath sounds are clear and equal bilaterally. No wheezes/rales/rhonchi. Gastrointestinal:   Soft and nontender. Non distended. There is no CVA tenderness.  No rebound, rigidity, or guarding. Genitourinary:   deferred Musculoskeletal:   Normal range of motion in all extremities. No joint effusions.  No lower extremity tenderness.  No edema. Neurologic:   Normal speech and language.  Motor grossly intact. No acute focal neurologic deficits are appreciated.  Skin:    Skin is warm, dry and intact. No rash noted.  No petechiae, purpura, or bullae.  ____________________________________________    LABS (pertinent positives/negatives) (all labs ordered are listed, but only abnormal results are displayed) Labs Reviewed  COMPREHENSIVE METABOLIC PANEL - Abnormal; Notable for the following components:      Result Value   Sodium 133 (*)    Glucose, Bld 321 (*)    Creatinine, Ser 1.10 (*)    Alkaline Phosphatase 128 (*)    GFR calc non Af Amer 45 (*)    GFR calc Af Amer 52 (*)    All other components within normal limits  CBC WITH DIFFERENTIAL/PLATELET - Abnormal; Notable for the following components:   RBC 3.67 (*)    Hemoglobin 11.2 (*)    HCT 33.6 (*)    All other components within normal limits  SEDIMENTATION RATE    ____________________________________________  EKG    ____________________________________________    RADIOLOGY  No results found.  ____________________________________________   PROCEDURES Procedures  ____________________________________________    CLINICAL IMPRESSION / ASSESSMENT AND PLAN / ED COURSE  Medications ordered in the ED: Medications  sodium chloride 0.9 % bolus 500 mL (0 mLs Intravenous Stopped 01/07/19 1840)  labetalol (NORMODYNE) injection 10 mg (10 mg Intravenous Given 01/07/19 1721)  labetalol (NORMODYNE) injection 10 mg (10 mg Intravenous Given 01/07/19 1908)    Pertinent labs & imaging results that were available during my care of the patient were reviewed by me and considered in my medical decision making (see chart for details).  Tiffany Bailey was evaluated in Emergency Department on 01/07/2019 for the symptoms described in the history of present illness. She was evaluated in the context of the global COVID-19 pandemic, which necessitated consideration that the patient might be at risk for infection with the SARS-CoV-2 virus that causes COVID-19. Institutional protocols and algorithms that pertain to the evaluation of patients at risk for COVID-19 are in a state of rapid change based on information released by regulatory bodies including the CDC and federal and state organizations. These policies and algorithms were followed during the patient's care in the ED.   Patient presents with hypertension, acute on chronic left temporoparietal headache without any neurologic symptoms or deficits.  Labs including ESR are unremarkable.Considering the patient's symptoms, medical history, and physical examination today, I have low suspicion for ischemic stroke, intracranial hemorrhage, meningitis, encephalitis, carotid or vertebral dissection, venous sinus thrombosis, MS, intracranial hypertension, glaucoma, CRAO, CRVO, or temporal arteritis.  I will give the patient  labetalol 10 mg IV for blood pressure control, give nonrebreather oxygen for headache symptom relief, reassess.  Clinical Course as of Jan 07 1927  Thu Jan 07, 2019  1921 Blood pressure now 160/60.  Patient reports that her headache feels much better after a period of high concentration oxygen.  Advised her to call her doctor tomorrow for recheck of blood pressure and adjustment of her blood pressure medicines.  ESR is normal further ruling out possibility of temporal arteritis.   [PS]    Clinical Course User Index [PS] Carrie Mew, MD     ____________________________________________   FINAL CLINICAL IMPRESSION(S) / ED DIAGNOSES    Final diagnoses:  Recurrent headache  Hypertension, unspecified type     ED Discharge Orders    None      Portions of this note were generated with dragon dictation software. Dictation errors may occur despite best attempts at proofreading.   Carrie Mew, MD 01/07/19 [id number]

## 2019-01-08 ENCOUNTER — Telehealth (INDEPENDENT_AMBULATORY_CARE_PROVIDER_SITE_OTHER): Payer: Self-pay

## 2019-01-08 ENCOUNTER — Telehealth: Payer: Self-pay

## 2019-01-08 NOTE — Telephone Encounter (Signed)
Called and spoke to pt.  Informed pt that she has an angiography of lower extremies scheduled w/ Dr. Leotis Pain w/  [facility] scheduled on 01/14/19.  Pt said that she was unaware that procedure had been scheduled.  Gave pt number to [facility] and advised pt to call.

## 2019-01-08 NOTE — Telephone Encounter (Signed)
Spoke with the patient and she is scheduled for 01/14/2019 with Dr. Lucky Cowboy and has a 7:45 am arrival time. Patient will do her Covid testing on 01/12/2019 between 12:30-2:30 pm. The patient did seem somewhat confused as to who she saw and why. I will send her the pre-procedure paperwork to her home.

## 2019-01-08 NOTE — Telephone Encounter (Signed)
Copied from [place] [id number]. Topic: Referral - Question >> Jan 08, 2019 10:28 AM Nathanial Millman J wrote: Reason for CRM: pt went to [facility] yesterday and got a call today saying that she had an appt at the hospital at 730 am.  She has nothing on her schedule.  She is confused and does not know who called, she is nervous, she can not make an appt that early.  Please call back to help Thank you

## 2019-01-11 ENCOUNTER — Other Ambulatory Visit (INDEPENDENT_AMBULATORY_CARE_PROVIDER_SITE_OTHER): Payer: Self-pay | Admitting: Nurse Practitioner

## 2019-01-12 ENCOUNTER — Other Ambulatory Visit: Payer: Self-pay

## 2019-01-12 ENCOUNTER — Other Ambulatory Visit
Admission: RE | Admit: 2019-01-12 | Discharge: 2019-01-12 | Disposition: A | Discharge status: Home / Self Care | Payer: Medicare HMO | Source: Ambulatory Visit | Attending: Vascular Surgery | Admitting: Vascular Surgery

## 2019-01-12 DIAGNOSIS — Z20828 Contact with and (suspected) exposure to other viral communicable diseases: Secondary | ICD-10-CM | POA: Insufficient documentation

## 2019-01-13 LAB — SARS CORONAVIRUS 2 (TAT 6-24 HRS): SARS Coronavirus 2: NEGATIVE

## 2019-01-13 NOTE — Telephone Encounter (Signed)
Patient called wanting to know what is being done to her at her procedure. I explained that her procedure was explained to her the day she was seen and gave permission to be scheduled. Patient stated she has not seen anyone and nobody has talked to her about what was going to be done to her. The patient seems very confused about what her procedure is and what is to be expected.

## 2019-01-14 ENCOUNTER — Ambulatory Visit
Admission: RE | Admit: 2019-01-14 | Discharge: 2019-01-14 | Disposition: A | Discharge status: Home / Self Care | Payer: Medicare HMO | Source: Ambulatory Visit | Attending: Vascular Surgery | Admitting: Vascular Surgery

## 2019-01-14 ENCOUNTER — Other Ambulatory Visit: Payer: Self-pay

## 2019-01-14 ENCOUNTER — Encounter
Admission: RE | Disposition: A | Discharge status: Home / Self Care | Payer: Self-pay | Source: Ambulatory Visit | Attending: Vascular Surgery

## 2019-01-14 DIAGNOSIS — Z90710 Acquired absence of both cervix and uterus: Secondary | ICD-10-CM | POA: Diagnosis not present

## 2019-01-14 DIAGNOSIS — I739 Peripheral vascular disease, unspecified: Secondary | ICD-10-CM

## 2019-01-14 DIAGNOSIS — E1122 Type 2 diabetes mellitus with diabetic chronic kidney disease: Secondary | ICD-10-CM | POA: Diagnosis not present

## 2019-01-14 DIAGNOSIS — Z955 Presence of coronary angioplasty implant and graft: Secondary | ICD-10-CM | POA: Diagnosis not present

## 2019-01-14 DIAGNOSIS — I252 Old myocardial infarction: Secondary | ICD-10-CM | POA: Insufficient documentation

## 2019-01-14 DIAGNOSIS — E785 Hyperlipidemia, unspecified: Secondary | ICD-10-CM | POA: Insufficient documentation

## 2019-01-14 DIAGNOSIS — I70239 Atherosclerosis of native arteries of right leg with ulceration of unspecified site: Secondary | ICD-10-CM

## 2019-01-14 DIAGNOSIS — Z823 Family history of stroke: Secondary | ICD-10-CM | POA: Diagnosis not present

## 2019-01-14 DIAGNOSIS — K219 Gastro-esophageal reflux disease without esophagitis: Secondary | ICD-10-CM | POA: Insufficient documentation

## 2019-01-14 DIAGNOSIS — L97519 Non-pressure chronic ulcer of other part of right foot with unspecified severity: Secondary | ICD-10-CM | POA: Diagnosis not present

## 2019-01-14 DIAGNOSIS — I129 Hypertensive chronic kidney disease with stage 1 through stage 4 chronic kidney disease, or unspecified chronic kidney disease: Secondary | ICD-10-CM | POA: Insufficient documentation

## 2019-01-14 DIAGNOSIS — E11621 Type 2 diabetes mellitus with foot ulcer: Secondary | ICD-10-CM | POA: Diagnosis present

## 2019-01-14 DIAGNOSIS — Z833 Family history of diabetes mellitus: Secondary | ICD-10-CM | POA: Insufficient documentation

## 2019-01-14 DIAGNOSIS — Z888 Allergy status to other drugs, medicaments and biological substances status: Secondary | ICD-10-CM | POA: Insufficient documentation

## 2019-01-14 DIAGNOSIS — L97919 Non-pressure chronic ulcer of unspecified part of right lower leg with unspecified severity: Secondary | ICD-10-CM

## 2019-01-14 DIAGNOSIS — Z89612 Acquired absence of left leg above knee: Secondary | ICD-10-CM | POA: Insufficient documentation

## 2019-01-14 DIAGNOSIS — N183 Chronic kidney disease, stage 3 (moderate): Secondary | ICD-10-CM | POA: Diagnosis not present

## 2019-01-14 DIAGNOSIS — I70235 Atherosclerosis of native arteries of right leg with ulceration of other part of foot: Secondary | ICD-10-CM | POA: Diagnosis not present

## 2019-01-14 DIAGNOSIS — Z79899 Other long term (current) drug therapy: Secondary | ICD-10-CM | POA: Insufficient documentation

## 2019-01-14 DIAGNOSIS — Z880 Allergy status to penicillin: Secondary | ICD-10-CM | POA: Insufficient documentation

## 2019-01-14 DIAGNOSIS — Z7982 Long term (current) use of aspirin: Secondary | ICD-10-CM | POA: Insufficient documentation

## 2019-01-14 DIAGNOSIS — Z681 Body mass index (BMI) 19 or less, adult: Secondary | ICD-10-CM | POA: Diagnosis not present

## 2019-01-14 DIAGNOSIS — Z9010 Acquired absence of unspecified breast and nipple: Secondary | ICD-10-CM | POA: Diagnosis not present

## 2019-01-14 DIAGNOSIS — M1990 Unspecified osteoarthritis, unspecified site: Secondary | ICD-10-CM | POA: Diagnosis not present

## 2019-01-14 DIAGNOSIS — Z7902 Long term (current) use of antithrombotics/antiplatelets: Secondary | ICD-10-CM | POA: Insufficient documentation

## 2019-01-14 DIAGNOSIS — Z951 Presence of aortocoronary bypass graft: Secondary | ICD-10-CM | POA: Insufficient documentation

## 2019-01-14 DIAGNOSIS — R627 Adult failure to thrive: Secondary | ICD-10-CM | POA: Diagnosis not present

## 2019-01-14 DIAGNOSIS — I2510 Atherosclerotic heart disease of native coronary artery without angina pectoris: Secondary | ICD-10-CM | POA: Insufficient documentation

## 2019-01-14 DIAGNOSIS — T82856A Stenosis of peripheral vascular stent, initial encounter: Secondary | ICD-10-CM

## 2019-01-14 HISTORY — PX: LOWER EXTREMITY ANGIOGRAPHY: CATH118251

## 2019-01-14 LAB — GLUCOSE, CAPILLARY
Glucose-Capillary: 140 mg/dL — ABNORMAL HIGH (ref 70–99)
Glucose-Capillary: 131 mg/dL — ABNORMAL HIGH (ref 70–99)

## 2019-01-14 SURGERY — LOWER EXTREMITY ANGIOGRAPHY
Anesthesia: Moderate Sedation | Site: Leg Lower | Laterality: Right

## 2019-01-14 MED ORDER — MIDAZOLAM HCL 2 MG/ML PO SYRP: 8.0000 mg | ORAL_SOLUTION | Freq: Once | ORAL | Status: DC | PRN

## 2019-01-14 MED ORDER — FENTANYL CITRATE (PF) 100 MCG/2ML IJ SOLN
INTRAMUSCULAR | Status: AC
  Filled 2019-01-14: qty 2

## 2019-01-14 MED ORDER — CLINDAMYCIN PHOSPHATE 300 MG/50ML IV SOLN
INTRAVENOUS | Status: AC
  Filled 2019-01-14: qty 50

## 2019-01-14 MED ORDER — MIDAZOLAM HCL 2 MG/2ML IJ SOLN
INTRAMUSCULAR | Status: DC | PRN
  Administered 2019-01-14: 2 mg via INTRAVENOUS

## 2019-01-14 MED ORDER — DIPHENHYDRAMINE HCL 50 MG/ML IJ SOLN: 50.0000 mg | Freq: Once | INTRAMUSCULAR | Status: DC | PRN

## 2019-01-14 MED ORDER — METHYLPREDNISOLONE SODIUM SUCC 125 MG IJ SOLR: 125.0000 mg | Freq: Once | INTRAMUSCULAR | Status: DC | PRN

## 2019-01-14 MED ORDER — ONDANSETRON HCL 4 MG/2ML IJ SOLN: 4.0000 mg | Freq: Four times a day (QID) | INTRAMUSCULAR | Status: DC | PRN

## 2019-01-14 MED ORDER — IODIXANOL 320 MG/ML IV SOLN
INTRAVENOUS | Status: DC | PRN
  Administered 2019-01-14: 110 mL via INTRAVENOUS

## 2019-01-14 MED ORDER — CLINDAMYCIN PHOSPHATE 300 MG/50ML IV SOLN: 300.0000 mg | Freq: Once | INTRAVENOUS | Status: DC

## 2019-01-14 MED ORDER — HEPARIN SODIUM (PORCINE) 1000 UNIT/ML IJ SOLN
INTRAMUSCULAR | Status: DC | PRN
  Administered 2019-01-14: 4000 [IU] via INTRAVENOUS

## 2019-01-14 MED ORDER — MIDAZOLAM HCL 5 MG/5ML IJ SOLN
INTRAMUSCULAR | Status: AC
  Filled 2019-01-14: qty 5

## 2019-01-14 MED ORDER — HEPARIN SODIUM (PORCINE) 1000 UNIT/ML IJ SOLN
INTRAMUSCULAR | Status: AC
  Filled 2019-01-14: qty 1

## 2019-01-14 MED ORDER — SODIUM CHLORIDE 0.9 % IV SOLN
INTRAVENOUS | Status: DC
  Administered 2019-01-14: 08:00:00 via INTRAVENOUS

## 2019-01-14 MED ORDER — FENTANYL CITRATE (PF) 100 MCG/2ML IJ SOLN
INTRAMUSCULAR | Status: DC | PRN
  Administered 2019-01-14: 50 ug via INTRAVENOUS

## 2019-01-14 MED ORDER — FAMOTIDINE 20 MG PO TABS: 40.0000 mg | ORAL_TABLET | Freq: Once | ORAL | Status: DC | PRN

## 2019-01-14 MED ORDER — HYDROMORPHONE HCL 1 MG/ML IJ SOLN: 1.0000 mg | Freq: Once | INTRAMUSCULAR | Status: DC | PRN

## 2019-01-14 SURGICAL SUPPLY — 23 items
BALLN LUTONIX 018 4X150X130 (BALLOONS) ×2
BALLN LUTONIX 018 5X300X130 (BALLOONS) ×2
BALLN ULTRVRSE 3X100X150 (BALLOONS) ×2
BALLOON LUTONIX 018 4X150X130 (BALLOONS) IMPLANT
BALLOON LUTONIX 018 5X300X130 (BALLOONS) IMPLANT
BALLOON ULTRVRSE 3X100X150 (BALLOONS) IMPLANT
CANNULA 5F STIFF (CANNULA) ×1 IMPLANT
CATH BEACON 5 .038 100 VERT TP (CATHETERS) ×1 IMPLANT
CATH CXI SUPP ANG 4FR 135 (CATHETERS) IMPLANT
CATH CXI SUPP ANG 4FR 135CM (CATHETERS) ×2
CATH PIG 70CM (CATHETERS) ×1 IMPLANT
DEVICE PRESTO INFLATION (MISCELLANEOUS) ×1 IMPLANT
DEVICE STARCLOSE SE CLOSURE (Vascular Products) ×1 IMPLANT
GLIDEWIRE ADV .035X260CM (WIRE) ×1 IMPLANT
PACK ANGIOGRAPHY (CUSTOM PROCEDURE TRAY) ×2 IMPLANT
SHEATH ANL2 6FRX45 HC (SHEATH) ×1 IMPLANT
SHEATH BRITE TIP 5FRX11 (SHEATH) ×1 IMPLANT
SHIELD RADPAD DADD DRAPE 4X9 (MISCELLANEOUS) ×1 IMPLANT
STENT VIABAHN 6X150X120 (Permanent Stent) ×1 IMPLANT
SYR MEDRAD MARK V 150ML (SYRINGE) ×1 IMPLANT
TUBING CONTRAST HIGH PRESS 72 (TUBING) ×1 IMPLANT
WIRE G V18X300CM (WIRE) ×1 IMPLANT
WIRE J 3MM .035X145CM (WIRE) ×1 IMPLANT

## 2019-01-14 NOTE — H&P (Signed)
[facility] History & Physical Update  The patient was interviewed and re-examined.  The patient's previous History and Physical has been reviewed and is unchanged.  There is no change in the plan of care. We plan to proceed with the scheduled procedure.  Leotis Pain, MD  01/14/2019, 9:31 AM

## 2019-01-14 NOTE — Op Note (Signed)
[facility]  Percutaneous Study/Intervention Procedural Note   Date of Surgery: 01/14/2019  Surgeon(s):Quinten Allerton    Assistants:none  Pre-operative Diagnosis: PAD with ulceration right lower extremity  Post-operative diagnosis:  Same  Procedure(s) Performed:             1.  Ultrasound guidance for vascular access left femoral artery             2.  Catheter placement into right common femoral artery from left femoral approach             3.  Aortogram and selective right lower extremity angiogram             4.  Percutaneous transluminal angioplasty of right peroneal artery with 3 mm diameter angioplasty balloon and tibioperoneal trunk and popliteal artery with 4 mm diameter Lutonix drug-coated angioplasty balloon             5.   Percutaneous transluminal angioplasty of the right SFA with 5 mm diameter by 30 cm length Lutonix drug-coated angioplasty balloon  6.  Stent placement to the proximal right SFA with a 6 mm diameter by 15 cm length Viabahn stent             7.  StarClose closure device left femoral artery  EBL: 10 cc  Contrast: 110 cc  Fluoro Time: 10.2 minutes  Moderate Conscious Sedation Time: approximately 35 minutes using 2 mg of Versed and 50 mcg of Fentanyl              Indications:  Patient is a 85 y.o.female with the ulceration of the right leg. The patient has noninvasive study showing markedly reduced perfusion on that right side. The patient is brought in for angiography for further evaluation and potential treatment.  Due to the limb threatening nature of the situation, angiogram was performed for attempted limb salvage. The patient is aware that if the procedure fails, amputation would be expected.  The patient also understands that even with successful revascularization, amputation may still be required due to the severity of the situation.  Risks and benefits are discussed and informed consent is obtained.   Procedure:  The patient was  identified and appropriate procedural time out was performed.  The patient was then placed supine on the table and prepped and draped in the usual sterile fashion. Moderate conscious sedation was administered during a face to face encounter with the patient throughout the procedure with my supervision of the RN administering medicines and monitoring the patient's vital signs, pulse oximetry, telemetry and mental status throughout from the start of the procedure until the patient was taken to the recovery room. Ultrasound was used to evaluate the left common femoral artery.  It was patent .  A digital ultrasound image was acquired.  A Seldinger needle was used to access the left common femoral artery under direct ultrasound guidance and a permanent image was performed.  A 0.035 J wire was advanced without resistance and a 5Fr sheath was placed.  Pigtail catheter was placed into the aorta and an AP aortogram was performed. This demonstrated normal renal arteries and normal aorta and iliac segments without significant stenosis. I then crossed the aortic bifurcation and advanced to the right femoral head. Selective right lower extremity angiogram was then performed. This demonstrated mild disease of the common femoral artery with severe disease of the profunda femoris artery beyond the primary branches with poor collateral flow.  The SFA occluded a few centimeters beyond  its origin and reconstituted the above-knee popliteal artery.  More distally, there was a stent in the distal popliteal artery with some hyperplasia creating about a 60 to 70% stenosis within the stent and about an 80% stenosis in the tibioperoneal trunk and proximal peroneal artery below the stent.  The peroneal artery was the only runoff distally and after the proximal stenosis it appeared to be continuous without disease beyond the proximal segment. It was felt that it was in the patient's best interest to proceed with intervention after these images  to avoid a second procedure and a larger amount of contrast and fluoroscopy based off of the findings from the initial angiogram. The patient was systemically heparinized and a 6 [place] Ansell sheath was then placed over the [employer] wire. I then used a Kumpe catheter and the advantage wire to navigate through the SFA occlusion and confirmed flow in the popliteal artery.  I then exchanged for a 0.018 wire and cross the stenosis in the popliteal artery stent, tibioperoneal trunk, and proximal peroneal artery parking the wire distally.  I then proceeded with treatment.  A 3 mm diameter by 10 cm length angioplasty balloon was inflated in the proximal peroneal artery up to the tibioperoneal trunk.  This was taken to 6 atm for 1 minute.  The tibioperoneal trunk and popliteal artery were then treated with a 4 mm diameter by 15 cm length Lutonix drug-coated angioplasty balloon inflated to 12 atm for 1 minute.  A 5 mm diameter by 30 cm length Lutonix drug-coated angioplasty balloon was then inflated from the proximal popliteal artery up to the origin of the SFA and taken to 14 atm for 1 minute.  The peroneal artery, tibioperoneal trunk, and popliteal artery had less than 20% residual stenosis after angioplasty.  The mid and distal SFA had less than 20% stenosis but the proximal SFA had marked wall irregularity with greater than 60% residual stenosis.  I elected to place a 6 mm diameter by 15 cm length Viabahn stent in the proximal SFA just below the origin of the SFA and postdilated this with a 5 mm balloon with excellent angiographic completion result and no significant residual stenosis. I elected to terminate the procedure. The sheath was removed and StarClose closure device was deployed in the left femoral artery with excellent hemostatic result. The patient was taken to the recovery room in stable condition having tolerated the procedure well.  Findings:               Aortogram:  Renal arteries appear to be  patent without significant stenosis.  Aorta and iliac arteries had good flow with no hemodynamically significant stenosis             Right lower Extremity:  Mild disease of the common femoral artery with severe disease of the profunda femoris artery beyond the primary branches with poor collateral flow.  The SFA occluded a few centimeters beyond its origin and reconstituted the above-knee popliteal artery.  More distally, there was a stent in the distal popliteal artery with some hyperplasia creating about a 60 to 70% stenosis within the stent and about an 80% stenosis in the tibioperoneal trunk and proximal peroneal artery below the stent.  The peroneal artery was the only runoff distally and after the proximal stenosis it appeared to be continuous without disease beyond the proximal segment   Disposition: Patient was taken to the recovery room in stable condition having tolerated the procedure well.  Complications: None  Leotis Pain 01/14/2019 10:47 AM   This note was created with Dragon Medical transcription system. Any errors in dictation are purely unintentional.

## 2019-01-20 ENCOUNTER — Ambulatory Visit (INDEPENDENT_AMBULATORY_CARE_PROVIDER_SITE_OTHER): Payer: Medicare Other

## 2019-01-20 ENCOUNTER — Other Ambulatory Visit: Payer: Self-pay

## 2019-01-20 DIAGNOSIS — Z0000 Encounter for general adult medical examination without abnormal findings: Secondary | ICD-10-CM | POA: Diagnosis not present

## 2019-01-20 NOTE — Patient Instructions (Addendum)
  Ms. Luckey , Thank you for taking time to come for your Medicare Wellness Visit. I appreciate your ongoing commitment to your health goals. Please review the following plan we discussed and let me know if I can assist you in the future.   These are the goals we discussed: Goals      Patient Stated   . Follow up with Primary Care Provider (pt-stated)     As needed Continue leg strengthening exercises Wear ted hose       This is a list of the screening recommended for you and due dates:  Health Maintenance  Topic Date Due  . Tetanus Vaccine  03/03/1947  . DEXA scan (bone density measurement)  03/04/1995  . Complete foot exam   09/11/2017  . Urine Protein Check  09/11/2017  . Hemoglobin A1C  02/27/2018  . Flu Shot  01/16/2019  . Eye exam for diabetics  11/18/2019  . Pneumonia vaccines  Completed

## 2019-01-20 NOTE — Progress Notes (Addendum)
Subjective:   Tiffany Bailey is a 85 y.o. female who presents for an Initial Medicare Annual Wellness Visit.  Review of Systems    No ROS.  Medicare Wellness Virtual Visit.  Visual/audio telehealth visit, UTA vital signs.   See social history for additional risk factors.  Cardiac Risk Factors include: advanced age (>34mn, >>96women);hypertension;diabetes mellitus     Objective:    Today's Vitals   There is no height or weight on file to calculate BMI.  Advanced Directives 01/20/2019 01/07/2019 03/30/2018 08/22/2016 02/25/2015  Does Patient Have a Medical Advance Directive? Yes No No No No  Type of Advance Directive [place] Does patient want to make changes to medical advance directive? No - Patient declined - - - -  Copy of [place] Chart? No - copy requested - - - -  Would patient like information on creating a medical advance directive? - - No - Patient declined No - Patient declined Yes - Educational materials given    Current Medications (verified) Outpatient Encounter Medications as of 01/20/2019  Medication Sig  . acetaminophen (TYLENOL) 500 MG tablet Take 1 tablet (500 mg total) by mouth every 6 (six) hours as needed.  .Marland KitchenamLODipine (NORVASC) 10 MG tablet Take 1 tablet (10 mg total) by mouth daily.  .Marland Kitchenaspirin EC 81 MG tablet Take 1 tablet (81 mg total) by mouth daily.  .Marland Kitchenatorvastatin (LIPITOR) 40 MG tablet Take 1 tablet (40 mg total) by mouth daily.  . blood glucose meter kit and supplies KIT Dispense based on patient and insurance preference. Use up to four times daily as directed. (FOR ICD-9 250.00, 250.01).  .Marland Kitchenclopidogrel (PLAVIX) 75 MG tablet Take 1 tablet (75 mg total) by mouth daily.  . cyclobenzaprine (FLEXERIL) 5 MG tablet Take 1 tablet (5 mg total) by mouth at bedtime as needed for muscle spasms.  .Marland Kitchenezetimibe (ZETIA) 10 MG tablet Take 1 tablet (10 mg total) by mouth daily.  . famotidine (PEPCID) 10 MG tablet Take 10  mg by mouth 2 (two) times daily.  . fluticasone (FLONASE) 50 MCG/ACT nasal spray Place 1 spray into both nostrils daily.  .Marland Kitchenglucose blood (ONE TOUCH TEST STRIPS) test strip Use to test blood sugar twice daily  . Insulin Glargine, 1 Unit Dial, (TOUJEO SOLOSTAR) 300 UNIT/ML SOPN Inject into the skin. U-300 5 units qhs per Dr. SGabriel Carina . Insulin Pen Needle (BD PEN NEEDLE NANO U/F) 32G X 4 MM MISC USE 1 DAILY  . levocetirizine (XYZAL) 5 MG tablet Take 1 tablet (5 mg total) by mouth every evening.  . loratadine (CLARITIN) 10 MG tablet Take 1 tablet (10 mg total) by mouth daily.  . metoprolol tartrate (LOPRESSOR) 25 MG tablet Take 1 tablet (25 mg total) by mouth 2 (two) times daily.  .Marland Kitchenomega-3 acid ethyl esters (LOVAZA) 1 g capsule Take by mouth daily.  . ONE TOUCH LANCETS MISC Use as directed 2 times per day  . nitroGLYCERIN (NITROSTAT) 0.4 MG SL tablet Place 1 tablet (0.4 mg total) under the tongue every 5 (five) minutes as needed for chest pain. (Patient not taking: Reported on 01/20/2019)  . sertraline (ZOLOFT) 50 MG tablet Take 1 tablet (50 mg total) by mouth at bedtime. (Patient not taking: Reported on 01/20/2019)   No facility-administered encounter medications on file as of 01/20/2019.     Allergies (verified) Amoxicillin, Gabapentin, Mirtazapine, and Other   History: Past Medical History:  Diagnosis Date  . Anemia   . Anginal pain ([place])   . Arthritis   . Cancer (HCC)    BREAST  . CKD (chronic kidney disease), stage III ([place])   . Coronary artery disease    S/P CABG and multiple PCI's  . Diabetes mellitus without complication ([place])   . Edema   . Failure to thrive in adult   . GERD (gastroesophageal reflux disease)   . 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 History of breast cancer   . HOH (hard of hearing)   . Hyperlipidemia   . Hypertension   . Myocardial infarction ([place])   . Palpitations    Past Surgical History:  Procedure Laterality Date  . ABDOMINAL HYSTERECTOMY     . ABOVE KNEE LEG AMPUTATION Left 2013  . ANTERIOR VITRECTOMY Left 11/16/2015   Procedure: ANTERIOR VITRECTOMY;  Surgeon: Eulogio Bear, MD;  Location: [facility] ORS;  Service: Ophthalmology;  Laterality: Left;  . BLADDER SURGERY    . CATARACT EXTRACTION W/PHACO Left 11/16/2015   Procedure: CATARACT EXTRACTION PHACO AND INTRAOCULAR LENS PLACEMENT (IOC);  Surgeon: Eulogio Bear, MD;  Location: [facility] ORS;  Service: Ophthalmology;  Laterality: Left;  Lot # [id number] H [place]; 01:22.8 AP%:12.6 CDE: 10.46  . CATARACT EXTRACTION W/PHACO Right 08/22/2016   Procedure: CATARACT EXTRACTION PHACO AND INTRAOCULAR LENS PLACEMENT (IOC);  Surgeon: Eulogio Bear, MD;  Location: [facility] ORS;  Service: Ophthalmology;  Laterality: Right;  Lot # [id number] H [place]: 00:52.1 AP%:8.7 CDE: 4.99   . CORONARY ANGIOPLASTY     STENT  . CORONARY ARTERY BYPASS GRAFT    . LOWER EXTREMITY ANGIOGRAPHY Right 01/14/2019   Procedure: LOWER EXTREMITY ANGIOGRAPHY;  Surgeon: Algernon Huxley, MD;  Location: [facility];  Service: Cardiovascular;  Laterality: Right;  . MASTECTOMY    . TUMOR REMOVAL     ABDOMINAL   Family History  Problem Relation Age of Onset  . Sudden death Mother   . Arthritis Father   . Stroke Father   . Breast cancer Sister   . Diabetes Grandchild    Social History   Socioeconomic History  . Marital status: Divorced    Spouse name: Not on file  . Number of children: 7  . Years of education: Not on file  . Highest education level: Not on file  Occupational History  . 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 Financial resource strain: Not hard at all  . Food insecurity    Worry: Never true    Inability: Never true  . Transportation needs    Medical: No    Non-medical: No  Tobacco Use  . Smoking status: Never Smoker  . Smokeless tobacco: Never Used  Substance and Sexual Activity  . Alcohol use: No  . Drug use: No  . Sexual activity: Not Currently  Lifestyle  . Physical  activity    Days per week: 7 days    Minutes per session: 20 min  . Stress: Only a little  Relationships  . Social connections    Talks on phone: More than three times a week    Gets together: Not on file    Attends religious service: Not on file    Active member of club or organization: Not on file    Attends meetings of clubs or organizations: Not on file    Relationship status: Not on file  Other Topics Concern  . Not on file  Social History  Narrative  . Not on file    Tobacco Counseling Counseling given: Not Answered   Clinical Intake:  Pre-visit preparation completed: Yes        Diabetes: Yes(Followed by Endocrinologist)  How often do you need to have someone help you when you read instructions, pamphlets, or other written materials from your doctor or pharmacy?: 1 - Never  Interpreter Needed?: No      Activities of Daily Living In your present state of health, do you have any difficulty performing the following activities: 01/20/2019  Hearing? N  Vision? N  Difficulty concentrating or making decisions? N  Walking or climbing stairs? Y  Comment Unsteady gait. Ambulates with walker indoors and wheelchair outdoors.  Dressing or bathing? N  Doing errands, shopping? Y  Comment She does not run errands alone  Conservation officer, nature and eating ? N  Using the Toilet? N  In the past six months, have you accidently leaked urine? N  Do you have problems with loss of bowel control? N  Managing your Medications? N  Managing your Finances? N  Housekeeping or managing your Housekeeping? Y  Comment Maid assist  Some recent data might be hidden     Immunizations and Health Maintenance Immunization History  Administered Date(s) Administered  . Influenza Split 04/29/2013  . Influenza, High Dose Seasonal PF 03/25/2016, 05/25/2018  . Influenza,inj,Quad PF,6+ Mos 08/10/2014  . Pneumococcal Conjugate-13 09/29/2017  . Pneumococcal Polysaccharide-23 03/09/2010, 09/11/2016    Health Maintenance Due  Topic Date Due  . TETANUS/TDAP  03/03/1947  . DEXA SCAN  03/04/1995  . FOOT EXAM  09/11/2017  . URINE MICROALBUMIN  09/11/2017  . HEMOGLOBIN A1C  02/27/2018  . INFLUENZA VACCINE  01/16/2019    Patient Care Team: [name], FNP as PCP - General (Family Medicine)  Indicate any recent Medical Services you may have received from other than [facility] providers in the past year (date may be approximate).     Assessment:   This is a routine wellness examination for [place].  I connected with patient 01/20/19 at 10:30 AM EDT by an audio enabled telemedicine application and verified that I am speaking with the correct person using two identifiers. Patient stated full name and DOB. Patient gave permission to continue with virtual visit. Patient's location was at home and Nurse's location was at [place] office.   Health Screenings  Followed by pcp Bone Density - discussed Glaucoma -none Hearing -demonstrates normal hearing during visit. Hemoglobin A1C - 08/2017 (9.3) Labs followed by pcp Dental- UTD Vision- visits within the last 12 months.  Social  Alcohol intake - no          Smoking history- never    Smokers in home? none Illicit drug use? none Exercise - chair and bed exercises for legs (raises, bending, stretching) Diet - diabetic diet Sexually Active -not currently BMI- discussed the importance of a healthy diet, water intake and the benefits of aerobic exercise.  Educational material provided.   Safety  Patient feels safe at home- yes Patient does have smoke detectors at home- yes Patient does wear sunscreen or protective clothing when in direct sunlight -yes Patient does wear seat belt when in a moving vehicle -yes Patient drives- very little Life alert- yes  Covid-19 precautions and sickness symptoms discussed.   Activities of Daily Living Patient denies needing assistance with: driving, feeding themselves, getting from bed to chair, getting  to the toilet, bathing/showering, dressing, managing money.  Maid assists with household chores and some  cooking.  Ambulates with walker and wheelchair.   Depression Screen Patient denies losing interest in daily life, feeling hopeless, or crying easily over simple problems.   Medication-taking as directed and without issues. Patient manages her medications with weekly pill box.   Fall Screen Patient denies being afraid of falling or falling in the last year.   Memory Screen Patient is alert.  Correctly identified the president of the [place] and season. Patient likes to complete word search puzzles for brain stimulation.  Immunizations The following Immunizations were discussed: Influenza, shingles, pneumonia, and tetanus.   Other Providers Patient Care Team: [name], FNP as PCP - General (Family Medicine)  Hearing/Vision screen  Hearing Screening   _0  _1  _2  _3  _4  _5  _6  _7  _8   Right ear:           Left ear:           Comments: Patient is able to hear conversational tones without difficulty.  No issues reported.  Vision Screening Comments: Wears reading glasses Cataract extraction, bilateral Glaucoma, L eye Visual acuity not assessed, virtual visit.  They have seen their ophthalmologist in the last 12 months.     Dietary issues and exercise activities discussed: Current Exercise Habits: Home exercise routine, Type of exercise: Other - see comments(Chair/bed exercises), Time (Minutes): 20, Frequency (Times/Week): 7, Weekly Exercise (Minutes/Week): 140, Intensity: Mild  Goals      Patient Stated   . Follow up with Primary Care Provider (pt-stated)     As needed Continue leg strengthening exercises Wear ted hose      Depression Screen PHQ 2/9 Scores 01/20/2019 04/08/2017 03/25/2016  PHQ - 2 Score 0 0 0    Fall Risk Fall Risk  01/20/2019 10/27/2018 04/08/2017 03/25/2016  Falls in the past year? 0 0 No Yes  Number falls in past  yr: - 0 - 2 or more  Injury with Fall? - 0 - No  Follow up - Falls evaluation completed - -  Is the patient's home free of loose throw rugs in walkways, pet beds, electrical cords, etc?   yes      Grab bars in the bathroom? yes      Handrails on the stairs?  yes      Adequate lighting?  yes  Cognitive Function:     6CIT Screen 01/20/2019  What Year? 0 points  What month? 0 points  What time? 0 points  Count back from 20 0 points  Months in reverse 0 points    Screening Tests Health Maintenance  Topic Date Due  . TETANUS/TDAP  03/03/1947  . DEXA SCAN  03/04/1995  . FOOT EXAM  09/11/2017  . URINE MICROALBUMIN  09/11/2017  . HEMOGLOBIN A1C  02/27/2018  . INFLUENZA VACCINE  01/16/2019  . OPHTHALMOLOGY EXAM  11/18/2019  . PNA vac Low Risk Adult  Completed      Plan:    End of life planning; Advance aging; Advanced directives discussed.  Copy of current HCPOA/Living Will requested.    I have personally reviewed and noted the following in the patient's chart:   . Medical and social history . Use of alcohol, tobacco or illicit drugs  . Current medications and supplements . Functional ability and status . Nutritional status . Physical activity . Advanced directives . List of other physicians . Hospitalizations, surgeries, and ER visits in previous 12 months . Vitals . Screenings to include cognitive, depression, and falls . Referrals and appointments  In addition, I have  reviewed and discussed with patient certain preventive protocols, quality metrics, and best practice recommendations. A written personalized care plan for preventive services as well as general preventive health recommendations were provided to patient.     Varney Biles, LPN   07/21/4008    Agree with plan. [name], NP

## 2019-01-25 ENCOUNTER — Other Ambulatory Visit: Payer: Self-pay | Admitting: Family

## 2019-01-25 ENCOUNTER — Other Ambulatory Visit: Payer: Self-pay

## 2019-01-25 ENCOUNTER — Ambulatory Visit (INDEPENDENT_AMBULATORY_CARE_PROVIDER_SITE_OTHER): Payer: Medicare Other | Admitting: Family

## 2019-01-25 ENCOUNTER — Ambulatory Visit: Payer: Self-pay | Admitting: Family

## 2019-01-25 ENCOUNTER — Other Ambulatory Visit: Payer: Medicare HMO

## 2019-01-25 ENCOUNTER — Encounter: Payer: Self-pay | Admitting: Family

## 2019-01-25 ENCOUNTER — Other Ambulatory Visit
Admission: RE | Admit: 2019-01-25 | Discharge: 2019-01-25 | Disposition: A | Discharge status: Home / Self Care | Payer: Medicare Other | Source: Ambulatory Visit | Attending: Family | Admitting: Family

## 2019-01-25 ENCOUNTER — Telehealth: Payer: Self-pay

## 2019-01-25 ENCOUNTER — Ambulatory Visit
Admission: RE | Admit: 2019-01-25 | Discharge: 2019-01-25 | Disposition: A | Discharge status: Home / Self Care | Payer: Medicare Other | Source: Ambulatory Visit | Attending: Family | Admitting: Family

## 2019-01-25 DIAGNOSIS — M7989 Other specified soft tissue disorders: Secondary | ICD-10-CM

## 2019-01-25 DIAGNOSIS — Z87448 Personal history of other diseases of urinary system: Secondary | ICD-10-CM | POA: Diagnosis not present

## 2019-01-25 DIAGNOSIS — R51 Headache: Secondary | ICD-10-CM | POA: Diagnosis not present

## 2019-01-25 DIAGNOSIS — R339 Retention of urine, unspecified: Secondary | ICD-10-CM | POA: Diagnosis not present

## 2019-01-25 DIAGNOSIS — I10 Essential (primary) hypertension: Secondary | ICD-10-CM

## 2019-01-25 DIAGNOSIS — R519 Headache, unspecified: Secondary | ICD-10-CM

## 2019-01-25 DIAGNOSIS — G8929 Other chronic pain: Secondary | ICD-10-CM

## 2019-01-25 LAB — URINALYSIS, ROUTINE W REFLEX MICROSCOPIC
Bacteria, UA: NONE SEEN
Bilirubin Urine: NEGATIVE
Glucose, UA: NEGATIVE mg/dL
Hgb urine dipstick: NEGATIVE
Ketones, ur: NEGATIVE mg/dL
Nitrite: NEGATIVE
Protein, ur: NEGATIVE mg/dL
Specific Gravity, Urine: 1.008 (ref 1.005–1.030)
pH: 7 (ref 5.0–8.0)

## 2019-01-25 LAB — COMPREHENSIVE METABOLIC PANEL
ALT: 22 U/L (ref 0–44)
AST: 24 U/L (ref 15–41)
Albumin: 3.5 g/dL (ref 3.5–5.0)
Alkaline Phosphatase: 88 U/L (ref 38–126)
Anion gap: 9 (ref 5–15)
BUN: 17 mg/dL (ref 8–23)
CO2: 25 mmol/L (ref 22–32)
Calcium: 9.3 mg/dL (ref 8.9–10.3)
Chloride: 101 mmol/L (ref 98–111)
Creatinine, Ser: 1.06 mg/dL — ABNORMAL HIGH (ref 0.44–1.00)
GFR calc Af Amer: 54 mL/min — ABNORMAL LOW (ref >60)
GFR calc non Af Amer: 47 mL/min — ABNORMAL LOW (ref >60)
Glucose, Bld: 194 mg/dL — ABNORMAL HIGH (ref 70–99)
Potassium: 4.7 mmol/L (ref 3.5–5.1)
Sodium: 135 mmol/L (ref 135–145)
Total Bilirubin: 0.6 mg/dL (ref 0.3–1.2)
Total Protein: 6.8 g/dL (ref 6.5–8.1)

## 2019-01-25 MED ORDER — DOXYCYCLINE HYCLATE 100 MG PO TABS: 100.0000 mg | ORAL_TABLET | Freq: Two times a day (BID) | ORAL | 0 refills | Status: DC

## 2019-01-25 NOTE — Patient Instructions (Addendum)
Labs and urine today.  Stat ultrasound of your RIGHT leg   Start doxycycline as concerned sinus infection may be source of headache  Close vigilance and follow up  I'll see you on Wednesday 01/27/19

## 2019-01-25 NOTE — Assessment & Plan Note (Addendum)
Etiology of headache is uncertain at this time.  Reassured by her blood pressure today.  Patient unable to quantify frequency of headache.  CT head is been negative recently.  Patient has sconcern that allergies, possible sinus infection have been contributory to headache.  reasonable to treat with empiric antibiotic.   patient feels comfortable with follow in the office in 2 days. If HA doesn't improve, plan to discussl MRI, MRA if necessary. Consideration for ENT referral with tinnitus, chronic hearing loss.  Patient understands to go to emergency room if any new or worsening symptoms.

## 2019-01-25 NOTE — Telephone Encounter (Signed)
Verdis Frederickson from [facility] called in with [place] results.  Negative for DVT.

## 2019-01-25 NOTE — Telephone Encounter (Signed)
Tiffany Bailey with [facility] Imaging Dept calling a U/S report for this pt.   I connected her with Butch Penny in [name], [place] office.

## 2019-01-25 NOTE — Assessment & Plan Note (Signed)
Etiology nonspecific. Urinary, kidney studies.  Patient is coming to the office as well.  If warranted, will refer to urology for further evaluation.

## 2019-01-25 NOTE — Assessment & Plan Note (Signed)
I am certainly more concerned in regards to DVT after procedure.  Advised patient to get a stat ultrasound today to ensure  not a complicated with DVT.  Patient verbalized understanding.

## 2019-01-25 NOTE — Assessment & Plan Note (Signed)
Controlled at home; although history of being quite labile. Continue regimen. Will follow

## 2019-01-25 NOTE — Progress Notes (Signed)
Verbal consent for services obtained from patient prior to services given to TELEPHONE visit:   Location of call:  provider at work patient at home  Names of all persons present for services: [name], NP Chief complaint:   Multiple complaints  Headache- every year has HA 'really bad in the spring' , usually  on left side of head behind left eye, to the left ear and will radiate to the jaw. Feels congestion and concern may have sinus infection.   Typically takes ASA and HA will resolve. Headache is there today and 'is mild'. 'annoying'. HA frequency is variable. Not sure how often HA comes. Hasnt had a HA for 3 weeks, started again yesterday. HA is not positional.   Taking tylenol every 6 hours however temporary relief. No sudden vision changes, no blurry vision or loss of vision. Will start with numbness over forehead.   Some nausea. Some regurgitation - thinks 'acid reflux.' Not on pepcid.  No photophobia. H/o glaucoma.  Using Flonase with only temporary relief.  Not using xyzal, claritin.  Feels 'pressure in forehead.'   Tinnitus in both ears, mostly left. Some decrease in hearing. No eye discharge,  facial drooping,chest pain , left arm pain or numbness,  ear pain, or ear discharge. No teeth pain or pain with eating. Swallowing okay. No fever or cough.   Sleeps well. Drinks one cup coffee/day.   Blood pressure today 137/60.  Leg pain improved; following with vascular, s/p angiography with Dr Lucky Cowboy.Swelling in left leg after angioplasty. No calf pain.   Complains of not urinating often and it 'trickles out.' No dysuria, urinary frequency.   History, background, results pertinent:  DM- increased basaglar to 6 units daily. Add metformin 500mg .  a1c -9 CT head 10/2018 negative for acute.  ED 01/07/2019. Sed Rate 9. Normal WBCs.   A/P/next steps: BP Readings from Last 3 Encounters:  01/14/19 (!) 163/75  01/07/19 (!) 170/61  01/05/19 (!) 142/61     Problem List Items  Addressed This Visit      Cardiovascular and Mediastinum   Essential hypertension    Controlled at home; although history of being quite labile. Continue regimen. Will follow        Genitourinary   Urinary retention    Etiology nonspecific. Urinary, kidney studies.  Patient is coming to the office as well.  If warranted, will refer to urology for further evaluation.        Other   Headache - Primary    Etiology of headache is uncertain at this time.  Reassured by her blood pressure today.  Patient unable to quantify frequency of headache.  CT head is been negative recently.  Patient has sconcern that allergies, possible sinus infection have been contributory to headache.  reasonable to treat with empiric antibiotic.   patient feels comfortable with follow in the office in 2 days. If HA doesn't improve, plan to discussl MRI, MRA if necessary. Consideration for ENT referral with tinnitus, chronic hearing loss.  Patient understands to go to emergency room if any new or worsening symptoms.       Relevant Medications   doxycycline (VIBRA-TABS) 100 MG tablet   Left leg swelling     I am certainly more concerned in regards to DVT after procedure.  Advised patient to get a stat ultrasound today to ensure  not a complicated with DVT.  Patient verbalized understanding.       Other Visit Diagnoses    Leg swelling  Relevant Orders   Comprehensive metabolic panel   US Venous Img Lower Unilateral Right   History of changes in urinary output       Relevant Orders   Comprehensive metabolic panel   Urinalysis, Routine w reflex microscopic   Urine Culture   Microalbumin / creatinine urine ratio      I spent 25 min  discussing plan of care over the phone.

## 2019-01-26 LAB — MICROALBUMIN / CREATININE URINE RATIO
Creatinine, Urine: 46.1 mg/dL
Microalb Creat Ratio: 15 mg/g creat (ref 0–29)
Microalb, Ur: 6.9 ug/mL — ABNORMAL HIGH

## 2019-01-26 LAB — URINE CULTURE

## 2019-01-27 ENCOUNTER — Ambulatory Visit (INDEPENDENT_AMBULATORY_CARE_PROVIDER_SITE_OTHER): Payer: Medicare Other | Admitting: Family

## 2019-01-27 ENCOUNTER — Encounter: Payer: Self-pay | Admitting: Family

## 2019-01-27 ENCOUNTER — Other Ambulatory Visit: Payer: Self-pay

## 2019-01-27 DIAGNOSIS — N183 Chronic kidney disease, stage 3 (moderate): Secondary | ICD-10-CM

## 2019-01-27 DIAGNOSIS — E1122 Type 2 diabetes mellitus with diabetic chronic kidney disease: Secondary | ICD-10-CM

## 2019-01-27 DIAGNOSIS — R51 Headache: Secondary | ICD-10-CM

## 2019-01-27 DIAGNOSIS — M7989 Other specified soft tissue disorders: Secondary | ICD-10-CM

## 2019-01-27 DIAGNOSIS — R519 Headache, unspecified: Secondary | ICD-10-CM

## 2019-01-27 DIAGNOSIS — R339 Retention of urine, unspecified: Secondary | ICD-10-CM

## 2019-01-27 DIAGNOSIS — K5900 Constipation, unspecified: Secondary | ICD-10-CM | POA: Insufficient documentation

## 2019-01-27 NOTE — Assessment & Plan Note (Signed)
Etiology uncertain at this time.  Patient declines consult with neurology to ensure there is not an obstruction.  Reviewed urine studies with patient which did not show infection.

## 2019-01-27 NOTE — Assessment & Plan Note (Signed)
Advised discretion metformin ; patient's EGFR is not below 45.  Below 30 is contraindicated. She will continue metformin for now.  

## 2019-01-27 NOTE — Assessment & Plan Note (Signed)
Resolved at this time.  Normal neurologic and HEENT exam.  Patient politely declines MRI of the brain at this time.  She will let me know if headache recurs after finished course of doxycycline.

## 2019-01-27 NOTE — Patient Instructions (Addendum)
Continue metformin however you need to ensure your endocrinologist  follows your kidney function.   use compression stocking on your right lower leg.  Ensure that you are elevating toes above your nose.    Please let me know if headaches were to return, certainly could have been from sinus congestion or infection however I want to be very cautious here.  We should consider MRI if it does return. Regards constipation please follow below.  Let me know if no bowel movement in the next 24 hours. IN regards to difficulty urinating, this could be from an obstruction, as discussed please consider referral to urology.  Please let me know if the symptom persists.  Constipation plan  1) Take Miralax once to twice per day until you have a bowel movement. You will end up titrating the use of Miralax. For example, you  may find that using the medication every other day or three times a week is a good bowel regimen for you. Or perhaps, twice weekly.   2)  If you do not get results with Miralax alone, you may then add Bisacodyl suppository daily to regimen until you get desired bowel results.  3) Take Colace ( stool softener) twice daily every day.   It is MOST important to drink LOTS of water and follow a HIGH fiber diet to keep foods moving through the gut. You may add Metamucil to a beverage that you drink.  Information on prevention of constipation as well as acute treatment for constipation as included below.  If there is no improvement in your symptoms, or if there is any worsening of symptoms, or if you have any additional concerns, please return to this clinic for re-evaluation; or, if we are closed, consider going to the Emergency Room for evaluation.    Constipation Prevention What is Constipation? Constipation is hard, dry bowel movements or the inability to have a bowel movement.  You can also feel like you need to have a bowel movement but not be able to.  It can also be painful when you  strain to have a bowel movement.  Taking narcotic pain medicine after surgery can make you constipated, even if you have never had a problem with constipation. What Do I Need To Do? The best thing to do for constipation is to keep it from happening.  This can be done by: Adding laxatives to your daily routine, when taking prescription pain medicines after surgery. Add 17 gm Miralax daily or 100 mg Colace once or twice daily. (Miralax is mixed in water. Colace is a pill). They soften your bowel movements to make them easier to pass and hurt less. Drink plenty of water to help flush your bowels.  (Eight, 8 ounce glasses daily) Eat foods high in fiber such as whole grains, vegetables, cereals, fruits, and prune juice (5-7 servings a day or 25 grams).  If you do not know how much fiber a food has in it you can look on the label under dietary fiber.  If you have trouble getting enough fiber in your diet you may want to consider a fiber supplement such as Metamucil or Citrucel.  Also, be aware that eating fiber without drinking enough water can make constipation worse. If you do become constipated some medications that may help are: Bisacodyl (Dulcolax) is available in tablet form or a suppository. Glycerin suppositories are also a good choice if you need a fast acting medication. Everybody is different and may have different results.  Talk  to your pharmacist or health care provider about your specific problems. They can help you choose the best product for you.  Why Is It Important for Me To Do This? Being constipated is not something you have to live with.  There are many things you can do to help.   Feeling bad can interfere with your recovery after surgery.  If constipation goes on for too long it can become a very serious medical problem. You may need to visit your doctor or go to the hospital.  That is why it is very important to drink lots of water, eat enough fiber, and keep it from happening.  Ask  Questions We want to answer all of your questions and concerns.  Thats why we encourage you to use a program called Ask Me 3, created by the [employer].  By using Ask Me 3 you are encouraged to ask 3 simple (yet, potentially life saving questions) whenever you are talking with your physician, nurse or pharmacist: What is my main problem? What do I need to do? Why is it important for me to do this? By understanding the answer to these three questions and any other questions you may have, you have the knowledge necessary to manage your health. Please feel very comfortable asking any questions. Healthcare is complicated, so if you hear an answer you do not understand, please ask your health care team to explain again.   Sources: [employer] Medline Plus 04-05-10 N  General Headache Without Cause A headache is pain or discomfort felt around the head or neck area. The specific cause of a headache may not be found. There are many causes and types of headaches. A few common ones are:  Tension headaches.  Migraine headaches.  Cluster headaches.  Chronic daily headaches. Follow these instructions at home: Watch your condition for any changes. Let your health care provider know about them. Take these steps to help with your condition: Managing pain      Take over-the-counter and prescription medicines only as told by your health care provider.  Lie down in a dark, quiet room when you have a headache.  If directed, put ice on your head and neck area: ? Put ice in a plastic bag. ? Place a towel between your skin and the bag. ? Leave the ice on for 20 minutes, 2-3 times per day.  If directed, apply heat to the affected area. Use the heat source that your health care provider recommends, such as a moist heat pack or a heating pad. ? Place a towel between your skin and the heat source. ? Leave the heat on for 20-30 minutes. ? Remove the heat if your  skin turns bright red. This is especially important if you are unable to feel pain, heat, or cold. You may have a greater risk of getting burned.  Keep lights dim if bright lights bother you or make your headaches worse. Eating and drinking  Eat meals on a regular schedule.  If you drink alcohol: ? Limit how much you use to:  0-1 drink a day for women.  0-2 drinks a day for men. ? Be aware of how much alcohol is in your drink. In the [place] one drink equals one 12 oz bottle of beer (355 mL), one 5 oz glass of wine (148 mL), or one 1 oz glass of hard liquor (44 mL).  Stop drinking caffeine, or decrease the amount of caffeine you drink. General instructions  Keep a headache journal to help find out what may trigger your headaches. For example, write down: ? What you eat and drink. ? How much sleep you get. ? Any change to your diet or medicines.  Try massage or other relaxation techniques.  Limit stress.  Sit up straight, and do not tense your muscles.  Do not use any products that contain nicotine or tobacco, such as cigarettes, e-cigarettes, and chewing tobacco. If you need help quitting, ask your health care provider.  Exercise regularly as told by your health care provider.  Sleep on a regular schedule. Get 7-9 hours of sleep each night, or the amount recommended by your health care provider.  Keep all follow-up visits as told by your health care provider. This is important. Contact a health care provider if:  Your symptoms are not helped by medicine.  You have a headache that is different from the usual headache.  You have nausea or you vomit.  You have a fever. Get help right away if:  Your headache becomes severe quickly.  Your headache gets worse after moderate to intense physical activity.  You have repeated vomiting.  You have a stiff neck.  You have a loss of vision.  You have problems with speech.  You have pain in the eye or ear.  You have  muscular weakness or loss of muscle control.  You lose your balance or have trouble walking.  You feel faint or pass out.  You have confusion.  You have a seizure. Summary  A headache is pain or discomfort felt around the head or neck area.  There are many causes and types of headaches. In some cases, the cause may not be found.  Keep a headache journal to help find out what may trigger your headaches. Watch your condition for any changes. Let your health care provider know about them.  Contact a health care provider if you have a headache that is different from the usual headache, or if your symptoms are not helped by medicine.  Get help right away if your headache becomes severe, you vomit, you have a loss of vision, you lose your balance, or you have a seizure. This information is not intended to replace advice given to you by your health care provider. Make sure you discuss any questions you have with your health care provider. Document Released: 06/03/2005 Document Revised: 12/22/2017 Document Reviewed: 12/22/2017 Elsevier Patient Education  2020 [employer].

## 2019-01-27 NOTE — Assessment & Plan Note (Signed)
Patient nontoxic in appearance.  Her abdomen is soft, bowel sounds audible.  Given her constipation plan with strict instructions if she does not have a bowel movement next 24 hours to call and let me know.  She declines abdominal x-ray today

## 2019-01-27 NOTE — Progress Notes (Signed)
Subjective:    Patient ID: Tiffany Bailey, female    DOB: 03/14/1928, 85 y.o.   MRN: [id number]  CC: Tiffany Bailey is a 85 y.o. female who presents today for follow up.   HPI: Accompanied by daughter today.  Multiple complaints. Complains of constipation.    She is unsure her last BM; she thinks it was approximately a week ago.  She is passing gas.  She has no fever abdominal pain, bloating, diarrhea or blood in her stool.  She did take some prune juice and had a small bowel movement which she thinks couple days ago. She also states that she continues to strain  when trying to urinate . This has been going on for couple days it is unchanged.  No dysuria, vaginal bleeding, urinary frequency, hematuria.   She has a history of a hysterectomy.  No history of uterine or bladder prolapse.  She does not feel any prolapse from her vagina  She states that she is drinking 2 to 3 glasses of water per day.  Appetite is decreased she thinks is from starting doxycycline although her weight is largely unchanged.   She complains that her right foot continues to swell, worse with prolonged periods of standing.  Improved with elevation.  She is been wearing a sock.  For the first time in years, she actually feels that her right leg is warm as it supposed to be since angioplasty.  No increased heat or erythema or rash.  States her headache is resolved with starting doxycycline.  She has no sudden vision changes, numbness of face, congestion.     HISTORY:  Past Medical History:  Diagnosis Date  . Anemia   . Anginal pain ([place])   . Arthritis   . Cancer (HCC)    BREAST  . CKD (chronic kidney disease), stage III ([place])   . Coronary artery disease    S/P CABG and multiple PCI's  . Diabetes mellitus without complication ([place])   . Edema   . Failure to thrive in adult   . GERD (gastroesophageal reflux disease)   . 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 History of breast cancer   . HOH (hard of  hearing)   . Hyperlipidemia   . Hypertension   . Myocardial infarction ([place])   . Palpitations    Past Surgical History:  Procedure Laterality Date  . ABDOMINAL HYSTERECTOMY    . ABOVE KNEE LEG AMPUTATION Left 2013  . ANTERIOR VITRECTOMY Left 11/16/2015   Procedure: ANTERIOR VITRECTOMY;  Surgeon: Eulogio Bear, MD;  Location: [facility] ORS;  Service: Ophthalmology;  Laterality: Left;  . BLADDER SURGERY    . CATARACT EXTRACTION W/PHACO Left 11/16/2015   Procedure: CATARACT EXTRACTION PHACO AND INTRAOCULAR LENS PLACEMENT (IOC);  Surgeon: Eulogio Bear, MD;  Location: [facility] OR[facility];  Service: Ophthalmology;  Laterality: Left;  Lot # [id number] H [place]; 01:22.8 AP%:12.6 CDE: 10.46  . CATARACT EXTRACTION W/PHACO Right 08/22/2016   Procedure: CATARACT EXTRACTION PHACO AND INTRAOCULAR LENS PLACEMENT (IOC);  Surgeon: Eulogio Bear, MD;  Location: [facility] ORS;  Service: Ophthalmology;  Laterality: Right;  Lot # [id number] H [place]: 00:52.1 AP%:8.7 CDE: 4.99   . CORONARY ANGIOPLASTY     STENT  . CORONARY ARTERY BYPASS GRAFT    . LOWER EXTREMITY ANGIOGRAPHY Right 01/14/2019   Procedure: LOWER EXTREMITY ANGIOGRAPHY;  Surgeon: Algernon Huxley, MD;  Location: [facility];  Service: Cardiovascular;  Laterality: Right;  . MASTECTOMY    . TUMOR  REMOVAL     ABDOMINAL   Family History  Problem Relation Age of Onset  . Sudden death Mother   . Arthritis Father   . Stroke Father   . Breast cancer Sister   . Diabetes Grandchild     Allergies: Amoxicillin, Gabapentin, Mirtazapine, and Other Current Outpatient Medications on File Prior to Visit  Medication Sig Dispense Refill  . acetaminophen (TYLENOL) 500 MG tablet Take 1 tablet (500 mg total) by mouth every 6 (six) hours as needed. 30 tablet 0  . amLODipine (NORVASC) 10 MG tablet Take 1 tablet (10 mg total) by mouth daily. 90 tablet 3  . aspirin EC 81 MG tablet Take 1 tablet (81 mg total) by mouth daily. 30 tablet 5  . atorvastatin (LIPITOR) 40 MG tablet Take  1 tablet (40 mg total) by mouth daily. 90 tablet 1  . blood glucose meter kit and supplies KIT Dispense based on patient and insurance preference. Use up to four times daily as directed. (FOR ICD-9 250.00, 250.01). 1 each 0  . clopidogrel (PLAVIX) 75 MG tablet Take 1 tablet (75 mg total) by mouth daily. 90 tablet 3  . cyclobenzaprine (FLEXERIL) 5 MG tablet Take 1 tablet (5 mg total) by mouth at bedtime as needed for muscle spasms. 30 tablet 0  . doxycycline (VIBRA-TABS) 100 MG tablet Take 1 tablet (100 mg total) by mouth 2 (two) times daily. 10 tablet 0  . ezetimibe (ZETIA) 10 MG tablet Take 1 tablet (10 mg total) by mouth daily. 90 tablet 3  . fluticasone (FLONASE) 50 MCG/ACT nasal spray Place 1 spray into both nostrils daily. 16 g 5  . glucose blood (ONE TOUCH TEST STRIPS) test strip Use to test blood sugar twice daily 200 each 3  . Insulin Glargine, 1 Unit Dial, (TOUJEO SOLOSTAR) 300 UNIT/ML SOPN Inject into the skin. U-300 5 units qhs per Dr. Gabriel Carina    . Insulin Pen Needle (BD PEN NEEDLE NANO U/F) 32G X 4 MM MISC USE 1 DAILY 100 each 12  . loratadine (CLARITIN) 10 MG tablet Take 1 tablet (10 mg total) by mouth daily. 30 tablet 2  . metFORMIN (GLUCOPHAGE) 500 MG tablet Take by mouth.    . metoprolol tartrate (LOPRESSOR) 25 MG tablet Take 1 tablet (25 mg total) by mouth 2 (two) times daily. 180 tablet 0  . nitroGLYCERIN (NITROSTAT) 0.4 MG SL tablet Place 1 tablet (0.4 mg total) under the tongue every 5 (five) minutes as needed for chest pain. 25 tablet 4  . omega-3 acid ethyl esters (LOVAZA) 1 g capsule Take by mouth daily.    . ONE TOUCH LANCETS MISC Use as directed 2 times per day 200 each 3  . sertraline (ZOLOFT) 50 MG tablet Take 1 tablet (50 mg total) by mouth at bedtime. 90 tablet 3   No current facility-administered medications on file prior to visit.     Social History   Tobacco Use  . Smoking status: Never Smoker  . Smokeless tobacco: Never Used  Substance Use Topics  . Alcohol  use: No  . Drug use: No    Review of Systems  Constitutional: Negative for chills and fever.  HENT: Negative for congestion, sore throat and trouble swallowing.   Respiratory: Negative for cough.   Cardiovascular: Positive for leg swelling (right leg). Negative for chest pain and palpitations.  Gastrointestinal: Positive for constipation. Negative for abdominal pain, anal bleeding, blood in stool, nausea and vomiting.  Genitourinary: Positive for difficulty urinating. Negative for decreased  urine volume, flank pain and hematuria.  Neurological: Negative for headaches (resolved).      Objective:    BP (!) 130/52   Pulse 71   Temp (!) 97.4 F (36.3 C)   Wt 88 lb 3.2 oz (40 kg)   SpO2 99%   BMI 17.23 kg/m  BP Readings from Last 3 Encounters:  01/27/19 (!) 130/52  01/14/19 (!) 163/75  01/07/19 (!) 170/61   Wt Readings from Last 3 Encounters:  01/27/19 88 lb 3.2 oz (40 kg)  01/14/19 88 lb (39.9 kg)  01/05/19 85 lb (38.6 kg)    Physical Exam Vitals signs reviewed.  Constitutional:      Appearance: She is well-developed.  HENT:     Head: Normocephalic and atraumatic.     Right Ear: Hearing, tympanic membrane, ear canal and external ear normal. No swelling or tenderness. No middle ear effusion. Tympanic membrane is not erythematous or bulging.     Left Ear: Tympanic membrane, ear canal and external ear normal. No swelling or tenderness.  No middle ear effusion. Tympanic membrane is not erythematous or bulging.     Nose: Nose normal. No rhinorrhea.     Right Sinus: No maxillary sinus tenderness or frontal sinus tenderness.     Left Sinus: No maxillary sinus tenderness or frontal sinus tenderness.     Mouth/Throat:     Pharynx: Uvula midline. No posterior oropharyngeal erythema.  Eyes:     General: Lids are normal. Lids are everted, no foreign bodies appreciated.     Conjunctiva/sclera: Conjunctivae normal.     Pupils: Pupils are equal, round, and reactive to light.      Comments: Normal fundus bilaterally   Cardiovascular:     Rate and Rhythm: Normal rate and regular rhythm.     Pulses: Normal pulses.     Heart sounds: Normal heart sounds.     Comments: +1 pedal right edema. Non pitting Wearing sock.  No palpable cords or masses. No erythema or increased warmth.  No discoloration of varicosities noted. RLE warm and palpable pedal pulses.  Left AKA.   Pulmonary:     Effort: Pulmonary effort is normal.     Breath sounds: Normal breath sounds. No wheezing, rhonchi or rales.  Abdominal:     General: Bowel sounds are normal.     Palpations: Abdomen is soft.     Tenderness: There is no abdominal tenderness.  Musculoskeletal:     Right lower leg: Edema present.  Lymphadenopathy:     Head:     Right side of head: No submental, submandibular, tonsillar, preauricular, posterior auricular or occipital adenopathy.     Left side of head: No submental, submandibular, tonsillar, preauricular, posterior auricular or occipital adenopathy.     Cervical: No cervical adenopathy.     Right cervical: No superficial, deep or posterior cervical adenopathy.    Left cervical: No superficial, deep or posterior cervical adenopathy.  Skin:    General: Skin is warm and dry.  Neurological:     Mental Status: She is alert.     Cranial Nerves: No cranial nerve deficit.     Sensory: No sensory deficit.     Deep Tendon Reflexes:     Reflex Scores:      Bicep reflexes are 2+ on the right side and 2+ on the left side.      Patellar reflexes are 2+ on the right side and 2+ on the left side.    Comments: Grip equal and strong  bilateral upper extremities. Gait strong and steady. Able to perform  finger-to-nose without difficulty.   Psychiatric:        Speech: Speech normal.        Behavior: Behavior normal.        Thought Content: Thought content normal.        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  Below 30 is contraindicated. She will continue metformin for now.         Genitourinary   Urinary retention    Etiology uncertain at this time.  Patient declines consult with neurology to ensure there is not an obstruction.  Reviewed urine studies with patient which did not show infection.         Other   Headache    Resolved at this time.  Normal neurologic and HEENT exam.  Patient politely declines MRI of the brain at this time.  She will let me know if headache recurs after finished course of doxycycline.      Right leg swelling    Suspect dependent edema versus postsurgical.  Advised compression stocking, elevation.  No evidence of infection. If no improvement will consider short course of diuretic therapy.  Patient will let me know.      Constipation    Patient nontoxic in appearance.  Her abdomen is soft, bowel sounds audible.  Given her constipation plan with strict instructions if she does not have a bowel movement next 24 hours to call and let me know.  She declines abdominal x-ray today          I am having Tiffany Bailey maintain her blood glucose meter kit and supplies, acetaminophen, aspirin EC, ONE TOUCH LANCETS, glucose blood, nitroGLYCERIN, omega-3 acid ethyl esters, metoprolol tartrate, ezetimibe, atorvastatin, fluticasone, Insulin Pen Needle, cyclobenzaprine, loratadine, Insulin Glargine (1 Unit Dial), amLODipine, clopidogrel, sertraline, metFORMIN, and doxycycline.   No orders of the defined types were placed in this encounter.   Return precautions given.   Risks, benefits, and alternatives of the medications and treatment plan prescribed today were discussed, and patient expressed understanding.   Education regarding symptom management and diagnosis given to patient on AVS.  Continue to follow with [name], FNP for routine health maintenance.   Tiffany Bailey and I agreed with plan.   [name], FNP

## 2019-01-27 NOTE — Assessment & Plan Note (Signed)
Suspect dependent edema versus postsurgical.  Advised compression stocking, elevation.  No evidence of infection. If no improvement will consider short course of diuretic therapy.  Patient will let me know.

## 2019-02-01 ENCOUNTER — Other Ambulatory Visit: Payer: Self-pay

## 2019-02-01 ENCOUNTER — Ambulatory Visit (INDEPENDENT_AMBULATORY_CARE_PROVIDER_SITE_OTHER): Payer: Medicare Other | Admitting: Family

## 2019-02-01 ENCOUNTER — Encounter: Payer: Self-pay | Admitting: Family

## 2019-02-01 ENCOUNTER — Telehealth: Payer: Self-pay | Admitting: Family

## 2019-02-01 ENCOUNTER — Ambulatory Visit: Payer: Self-pay | Admitting: Family

## 2019-02-01 VITALS — Ht 60.0 in | Wt 88.2 lb

## 2019-02-01 DIAGNOSIS — K5900 Constipation, unspecified: Secondary | ICD-10-CM | POA: Diagnosis not present

## 2019-02-01 DIAGNOSIS — G8929 Other chronic pain: Secondary | ICD-10-CM

## 2019-02-01 DIAGNOSIS — R339 Retention of urine, unspecified: Secondary | ICD-10-CM

## 2019-02-01 DIAGNOSIS — R51 Headache: Secondary | ICD-10-CM

## 2019-02-01 DIAGNOSIS — R519 Headache, unspecified: Secondary | ICD-10-CM

## 2019-02-01 NOTE — Assessment & Plan Note (Signed)
Resolved. DO not think metformin contributory. FBG are at goal without metformin so I advised her to discuss this with her endocrinologist in regards to if she should stay off this medication.

## 2019-02-01 NOTE — Telephone Encounter (Signed)
Called pt.  Pt aware that she can walk in to [facility] to have Xray of abdomen.  Lorriane Shire is working on scheduling the stat MRI and MRA of brain.  Pt aware that someone will be calling her for an appt for the MRI and MRA,  Pt said that she will be able to walk-in at [facility] for X-ray tomorrow morning.

## 2019-02-01 NOTE — Telephone Encounter (Signed)
Tiffany Bailey if you could work on the state MRI brain and MRA brain, I will call [facility] to get patient scheduled for Xray of abdomen.  Thanks.

## 2019-02-01 NOTE — Telephone Encounter (Signed)
Pt reports headache. Saw Tiffany Paris, FNP 01/27/2019 similar symptoms, completed course of antibiotics Friday. States headache "Not any better." States intermittent, 10/10 when occurs, left sided only.States took tylenol that was effective, "But keeps coming back." Also reports tenderness at forehead and "Left side of my nose itchy." Reports "Lots of clear mucous from my nose but less today." Denies fever, denies any right sided weakness, no facial droop. Speech clear during call.  Also reports constipation and loss of appetite. Call transferred to practice, [facility], for consideration of appt. CAre advise given per protocol. Pts phone number verified, no access to computer.  Reason for Disposition . [1] MODERATE headache (e.g., interferes with normal activities) AND [2] present > 24 hours AND [3] unexplained  (Exceptions: analgesics not tried, typical migraine, or headache part of viral illness)  Answer Assessment - Initial Assessment Questions 1. LOCATION: "Where does it hurt?"      Left side of head 2. ONSET: "When did the headache start?" (Minutes, hours or days)      Sunday 3. PATTERN: "Does the pain come and go, or has it been constant since it started?"     Intermittent 4. SEVERITY: "How bad is the pain?" and "What does it keep you from doing?"  (e.g., Scale 1-10; mild, moderate, or severe)   - MILD (1-3): doesn\'t interfere with normal activities    - MODERATE (4-7): interferes with normal activities or awakens from sleep    - SEVERE (8-10): excruciating pain, unable to do any normal activities        10 /10 5. RECURRENT SYMPTOM: "Have you ever had headaches before?" If so, ask: "When was the last time?" and "What happened that time?"      Saturday took tylenol, effective 6. CAUSE: "What do you think is causing the headache?"     Allergies 7. MIGRAINE: "Have you been diagnosed with migraine headaches?" If so, ask: "Is this headache similar?"      No, allergies 8. HEAD INJURY: "Has  there been any recent injury to the head?"      no 9. OTHER SYMPTOMS: "Do you have any other symptoms?" (fever, stiff neck, eye pain, sore throat, cold symptoms)    Forehead and nose itchy on left side. Congested, "Lots of mucous out of nose, not as much now, clear. Sometimes it bleeds." Constipated, no appetite, generalized weakness  Protocols used: HEADACHE-A-AH

## 2019-02-01 NOTE — Assessment & Plan Note (Signed)
Waxing and waning in regards to headache recurrence. Improved today with tylenol. NO etiology for HA. Blood pressure has been controlled of late.   Does not appear  sinus infection overall contributory.  She is CT head showed no acute findings 10/2018.    We will look further with MRI brain, MRA as headache has been on going for months . I have ordered STAT.  Also advised patient that she will need to establish with neurology for further evaluation and treatment. She will stay vigilant and let me know of any new or worsening features in regards to headache.

## 2019-02-01 NOTE — Assessment & Plan Note (Signed)
Some BM. She doesn't complain of abdominal pain, bloating.Pending XR Ab to look for obstruction. Patient declines having done today due to transportation. She tried miralax once; advised this was not enough.  Reiterated the importance of being consistent with this medication and using once to twice daily with plenty of water.  She verbalized understanding.

## 2019-02-01 NOTE — Telephone Encounter (Signed)
Call pt  She needs stat XR abdomen , stat MRI brain , stat MRA brain.  Please call vanessa and let her know about MRIs. I also placed urgent referral to neurology  Where does she go for XR abdomen? [facility]? Please arrange and schedule for pt at [facility]  Let pt know where she needs to go for all

## 2019-02-01 NOTE — Progress Notes (Signed)
Verbal consent for services obtained from patient prior to services given to TELEPHONE visit:   Location of call:  provider at work patient at home  Names of all persons present for services: Tiffany Paris, NP Chief complaint:   Constipation - about a brown formed 'dot' of bowel movement 2 days ago. Passing gas. No appetite. 'Frying all foods'. Some nausea. No vomiting, bloating, abdominal pain, fever. Drinking more water.   H/o hysterectomy, bladder surgery for 'bladder tack' per patient. Tried miralax once 2 days ago without result.   DM- following with [name]. FBG today 99. No hypoglycemic episodes. Glargine 6 units at bedtime. a1c of 9 01/2019.   HA- Improving as took tylenol an hour ago. Not the worst HA of life.  started 3 days ago, left side forehead and behind left eye. No sudden vision changes, confusion, numbness face. hasnt check blood pressure today , however 'doesn't feel high.' No CP, SOB.   Completed doxycycline 3 days ago. Sinus pressure and nasal congestion improved on doxycycline.   Urinating easier now, thinks from stopping metformin. No dysuria.      A/P/next steps:  Problem List Items Addressed This Visit      Genitourinary   Urinary retention    Resolved. DO not think metformin contributory. FBG are at goal without metformin so I advised her to discuss this with her endocrinologist in regards to if she should stay off this medication.         Other   Headache    Waxing and waning in regards to headache recurrence. Improved today with tylenol. NO etiology for HA. Blood pressure has been controlled of late.   Does not appear  sinus infection overall contributory.  She is CT head showed no acute findings 10/2018.    We will look further with MRI brain, MRA as headache has been on going for months . I have ordered STAT.  Also advised patient that she will need to establish with neurology for further evaluation and treatment. She will stay vigilant and let  me know of any new or worsening features in regards to headache.      Relevant Orders   MR Brain W Wo Contrast   MR Angiogram Head Wo Contrast   Ambulatory referral to Neurology   MR Angiogram Head Wo Contrast   Constipation - Primary    Some BM. She doesn't complain of abdominal pain, bloating.Pending XR Ab to look for obstruction. Patient declines having done today due to transportation. She tried miralax once; advised this was not enough.  Reiterated the importance of being consistent with this medication and using once to twice daily with plenty of water.  She verbalized understanding.      Relevant Orders   US ABDOMEN LIMITED RUQ   DG Abd 2 Views      I spent 25 min  discussing plan of care over the phone.

## 2019-02-01 NOTE — Telephone Encounter (Signed)
FYI

## 2019-02-01 NOTE — Telephone Encounter (Signed)
Per Lorriane Shire, MRI and MRA could be scheduled for today but patient did not have transportation to get to appt.  No appts available for tomorrow so pt was scheduled MRI and MRA for Wednesday @ 3:30 pm.

## 2019-02-01 NOTE — Patient Instructions (Addendum)
Increase fruits and vegetables. LIMIT fried foods and aim to increase fiber in your diet.  Try Miralax once to twice per day with PLENTY of water for constipation.   Take Miralax once to twice per day until you have a bowel movement. You will end up titrating the use of Miralax. For example, you  may find that using the medication every other day or three times a week is a good bowel regimen for you. Or perhaps, twice weekly.    If you do not get results with Miralax alone, you may then add Bisacodyl suppository daily to regimen until you get desired bowel results.  Today we discussed referrals, orders. [place] right upper quadrant of your abdomen, referral to neurology, Xray of your abdomen, MRI and MRA of your brain.    I have placed these orders in the system for you.  Please be sure to give [place] a call if you have not heard from our office regarding this. We should hear from [place] within ONE week with information regarding your appointment. If not, please let me know immediately.   Please let me know how you are doing.   If there is no improvement in your symptoms, or if there is any worsening of symptoms, or if you have any additional concerns, please return to this clinic for re-evaluation; or, if we are closed, consider going to the Emergency Room for evaluation.    Constipation Prevention What is Constipation? Constipation is hard, dry bowel movements or the inability to have a bowel movement.  You can also feel like you need to have a bowel movement but not be able to.  It can also be painful when you strain to have a bowel movement.  Taking narcotic pain medicine after surgery can make you constipated, even if you have never had a problem with constipation. What Do I Need To Do? The best thing to do for constipation is to keep it from happening.  This can be done by: Adding laxatives to your daily routine, when taking prescription pain medicines after surgery. Add 17 gm Miralax daily or 100 mg  Colace once or twice daily. (Miralax is mixed in water. Colace is a pill). They soften your bowel movements to make them easier to pass and hurt less. Drink plenty of water to help flush your bowels.  (Eight, 8 ounce glasses daily) Eat foods high in fiber such as whole grains, vegetables, cereals, fruits, and prune juice (5-7 servings a day or 25 grams).  If you do not know how much fiber a food has in it you can look on the label under dietary fiber.  If you have trouble getting enough fiber in your diet you may want to consider a fiber supplement such as Metamucil or Citrucel.  Also, be aware that eating fiber without drinking enough water can make constipation worse. If you do become constipated some medications that may help are: Bisacodyl (Dulcolax) is available in tablet form or a suppository. Glycerin suppositories are also a good choice if you need a fast acting medication. Everybody is different and may have different results.  Talk to your pharmacist or health care provider about your specific problems. They can help you choose the best product for you.  Why Is It Important for Me To Do This? Being constipated is not something you have to live with.  There are many things you can do to help.   Feeling bad can interfere with your recovery after surgery.  If  constipation goes on for too long it can become a very serious medical problem. You may need to visit your doctor or go to the hospital.  That is why it is very important to drink lots of water, eat enough fiber, and keep it from happening.  Ask Questions We want to answer all of your questions and concerns.  Thats why we encourage you to use a program called Ask Me 3, created by the [employer].  By using Ask Me 3 you are encouraged to ask 3 simple (yet, potentially life saving questions) whenever you are talking with your physician, nurse or pharmacist: What is my main problem? What do I need to do? Why  is it important for me to do this? By understanding the answer to these three questions and any other questions you may have, you have the knowledge necessary to manage your health. Please feel very comfortable asking any questions. Healthcare is complicated, so if you hear an answer you do not understand, please ask your health care team to explain again.   Sources: [employer] 04-05-10 N

## 2019-02-03 ENCOUNTER — Ambulatory Visit
Admission: RE | Admit: 2019-02-03 | Discharge: 2019-02-03 | Disposition: A | Discharge status: Home / Self Care | Payer: Medicare Other | Source: Ambulatory Visit | Attending: Family | Admitting: Family

## 2019-02-03 ENCOUNTER — Other Ambulatory Visit: Payer: Self-pay

## 2019-02-03 DIAGNOSIS — I6523 Occlusion and stenosis of bilateral carotid arteries: Secondary | ICD-10-CM | POA: Insufficient documentation

## 2019-02-03 DIAGNOSIS — K5900 Constipation, unspecified: Secondary | ICD-10-CM

## 2019-02-03 DIAGNOSIS — R51 Headache: Secondary | ICD-10-CM | POA: Diagnosis not present

## 2019-02-03 DIAGNOSIS — G8929 Other chronic pain: Secondary | ICD-10-CM

## 2019-02-03 DIAGNOSIS — J984 Other disorders of lung: Secondary | ICD-10-CM | POA: Diagnosis not present

## 2019-02-03 DIAGNOSIS — R519 Headache, unspecified: Secondary | ICD-10-CM

## 2019-02-03 MED ORDER — GADOBUTROL 1 MMOL/ML IV SOLN
4.0000 mL | Freq: Once | INTRAVENOUS | Status: AC | PRN
  Administered 2019-02-03: 4 mL via INTRAVENOUS

## 2019-02-03 NOTE — Telephone Encounter (Signed)
Tiffany Bailey,  I dont see where she Xr of ab?  Call pt Can she go this morning to [place]?  Also, has she had bowel movement miralax 1-2 twice per day?

## 2019-02-03 NOTE — Telephone Encounter (Signed)
Called pt. No answer. LMTCB

## 2019-02-04 ENCOUNTER — Other Ambulatory Visit: Payer: Self-pay | Admitting: Family

## 2019-02-04 DIAGNOSIS — K5900 Constipation, unspecified: Secondary | ICD-10-CM

## 2019-02-04 DIAGNOSIS — R918 Other nonspecific abnormal finding of lung field: Secondary | ICD-10-CM

## 2019-02-04 MED ORDER — LACTULOSE 20 GM/30ML PO SOLN: ORAL | 3 refills | Status: DC

## 2019-02-05 ENCOUNTER — Other Ambulatory Visit (INDEPENDENT_AMBULATORY_CARE_PROVIDER_SITE_OTHER): Payer: Self-pay | Admitting: Vascular Surgery

## 2019-02-05 DIAGNOSIS — Z95820 Peripheral vascular angioplasty status with implants and grafts: Secondary | ICD-10-CM

## 2019-02-05 NOTE — Telephone Encounter (Signed)
Patient said that she has not went to get an Xray of her abdomen yet due to not having transportation.  Patient said that she has plans to go on Monday.  Patient said that she had a bowel movement yesterday but the stool was hard.  Patient said taking miralax alone did not help.  Patient said that yesterday she took miralax, dulcolax and prune juice all together at one time and felt it helped.

## 2019-02-08 ENCOUNTER — Ambulatory Visit (INDEPENDENT_AMBULATORY_CARE_PROVIDER_SITE_OTHER): Payer: Medicare Other

## 2019-02-08 ENCOUNTER — Telehealth: Payer: Self-pay | Admitting: Family

## 2019-02-08 ENCOUNTER — Encounter (INDEPENDENT_AMBULATORY_CARE_PROVIDER_SITE_OTHER): Payer: Self-pay | Admitting: Nurse Practitioner

## 2019-02-08 ENCOUNTER — Ambulatory Visit: Payer: Medicare HMO | Admitting: Family

## 2019-02-08 ENCOUNTER — Ambulatory Visit (INDEPENDENT_AMBULATORY_CARE_PROVIDER_SITE_OTHER)
Admission: RE | Admit: 2019-02-08 | Discharge: 2019-02-08 | Disposition: A | Discharge status: Home / Self Care | Payer: Medicare Other | Source: Ambulatory Visit | Attending: Family | Admitting: Family

## 2019-02-08 ENCOUNTER — Ambulatory Visit (INDEPENDENT_AMBULATORY_CARE_PROVIDER_SITE_OTHER): Payer: Medicare Other | Admitting: Nurse Practitioner

## 2019-02-08 ENCOUNTER — Other Ambulatory Visit: Payer: Self-pay

## 2019-02-08 VITALS — BP 153/83 | HR 67 | Resp 12 | Ht 60.0 in | Wt 88.0 lb

## 2019-02-08 DIAGNOSIS — R918 Other nonspecific abnormal finding of lung field: Secondary | ICD-10-CM

## 2019-02-08 DIAGNOSIS — Z95820 Peripheral vascular angioplasty status with implants and grafts: Secondary | ICD-10-CM

## 2019-02-08 DIAGNOSIS — I739 Peripheral vascular disease, unspecified: Secondary | ICD-10-CM | POA: Diagnosis not present

## 2019-02-08 DIAGNOSIS — E1159 Type 2 diabetes mellitus with other circulatory complications: Secondary | ICD-10-CM | POA: Diagnosis not present

## 2019-02-08 DIAGNOSIS — K219 Gastro-esophageal reflux disease without esophagitis: Secondary | ICD-10-CM

## 2019-02-08 DIAGNOSIS — Z794 Long term (current) use of insulin: Secondary | ICD-10-CM

## 2019-02-08 NOTE — Telephone Encounter (Signed)
Noted- glad helpful.  Note sent to Judson Roch in regards to ensuring patient will go to [facility] for CXR.

## 2019-02-08 NOTE — Telephone Encounter (Signed)
-----   Message from Algernon Huxley, MD sent at 02/08/2019  6:41 AM EDT ----- Regarding: RE: No that sounds food. For that distal disease, medical therapy is appropriate. Doubt that is causing her headache, and there is really no role for intervention at those locations.  Thanks and have a great day ----- Message ----- From: [name], FNP Sent: 02/08/2019   6:32 AM EDT To: Algernon Huxley, MD  Dr Lucky Cowboy,   [facility] you are well.   I ordered MRI/MRA Brain for our mutual patient after months of relenting headache; presenting almost 'ice pick' in nature; awaiting consult with neurology as well. NO acute vision changes.   I just wanted to ensure that the atherosclerosis seen on MRA  [Severe atherosclerotic disease with high-grade narrowing at the bilateral carotid siphon, left vertebral, and proximal basilar] doesn't change our current treatment ( primary lipitor 40mg , ASA 81mg ) or you feel would be related to her headache..   As always, appreciate your advice and certainly any recommendations for her plan of care with me as well.  Catalina Antigua, NP

## 2019-02-08 NOTE — Progress Notes (Signed)
SUBJECTIVE:  Patient ID: Tiffany Bailey, female    DOB: March 31, 1928, 85 y.o.   MRN: [id number] Chief Complaint  Patient presents with  . Follow-up    HPI  Tiffany Bailey is a 85 y.o. female The patient returns to the office for followup and review status post angiogram with intervention. The patient notes improvement in the lower extremity symptoms. No interval shortening of the patient's claudication distance or rest pain symptoms. Previous wounds have now healed.  No new ulcers or wounds have occurred since the last visit.  There have been no significant changes to the patient's overall health care.  The patient denies amaurosis fugax or recent TIA symptoms. There are no recent neurological changes noted. The patient denies history of DVT, PE or superficial thrombophlebitis. The patient denies recent episodes of angina or shortness of breath.   ABI's Rt=0.94 and Lt=n/a  (previous ABI's Rt=0.57 and Lt=n/a) Duplex US of the right lower extremity arterial system shows monophasic waveforms in the tibial arteries.  There is strong right toe waveforms.  There is a left below-knee amputation. Past Medical History:  Diagnosis Date  . Anemia   . Anginal pain ([place])   . Arthritis   . Cancer (HCC)    BREAST  . CKD (chronic kidney disease), stage III ([place])   . Coronary artery disease    S/P CABG and multiple PCI's  . Diabetes mellitus without complication ([place])   . Edema   . Failure to thrive in adult   . GERD (gastroesophageal reflux disease)   . Hb-SS disease without crisis ([place]) 10/23/2017  . History of breast cancer   . HOH (hard of hearing)   . Hyperlipidemia   . Hypertension   . Myocardial infarction ([place])   . Palpitations     Past Surgical History:  Procedure Laterality Date  . ABDOMINAL HYSTERECTOMY    . ABOVE KNEE LEG AMPUTATION Left 2013  . ANTERIOR VITRECTOMY Left 11/16/2015   Procedure: ANTERIOR VITRECTOMY;  Surgeon: Eulogio Bear, MD;  Location: [facility];  Service:  Ophthalmology;  Laterality: Left;  . BLADDER SURGERY    . CATARACT EXTRACTION W/PHACO Left 11/16/2015   Procedure: CATARACT EXTRACTION PHACO AND INTRAOCULAR LENS PLACEMENT (IOC);  Surgeon: Eulogio Bear, MD;  Location: [facility];  Service: Ophthalmology;  Laterality: Left;  Lot # [id number] H [place]; 01:22.8 AP%:12.6 CDE: 10.46  . CATARACT EXTRACTION W/PHACO Right 08/22/2016   Procedure: CATARACT EXTRACTION PHACO AND INTRAOCULAR LENS PLACEMENT (IOC);  Surgeon: Eulogio Bear, MD;  Location: [facility];  Service: Ophthalmology;  Laterality: Right;  Lot # [id number] H [place]: 00:52.1 AP%:8.7 CDE: 4.99   . CORONARY ANGIOPLASTY     STENT  . CORONARY ARTERY BYPASS GRAFT    . LOWER EXTREMITY ANGIOGRAPHY Right 01/14/2019   Procedure: LOWER EXTREMITY ANGIOGRAPHY;  Surgeon: Algernon Huxley, MD;  Location: [place] [facility];  Service: Cardiovascular;  Laterality: Right;  . MASTECTOMY    . TUMOR REMOVAL     ABDOMINAL    Social History   Socioeconomic History  . Marital status: Divorced    Spouse name: Not on file  . Number of children: 7  . Years of education: Not on file  . Highest education level: Not on file  Occupational History  . 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 Financial resource strain: Not hard at all  . Food insecurity    Worry: Never true    Inability: Never true  .  Transportation needs    Medical: No    Non-medical: No  Tobacco Use  . Smoking status: Never Smoker  . Smokeless tobacco: Never Used  Substance and Sexual Activity  . Alcohol use: No  . Drug use: No  . Sexual activity: Not Currently  Lifestyle  . Physical activity    Days per week: 7 days    Minutes per session: 20 min  . Stress: Only a little  Relationships  . Social connections    Talks on phone: More than three times a week    Gets together: Not on file    Attends religious service: Not on file    Active member of club or organization: Not on file    Attends meetings of clubs or  organizations: Not on file    Relationship status: Not on file  . Intimate partner violence    Fear of current or ex partner: No    Emotionally abused: No    Physically abused: No    Forced sexual activity: No  Other Topics Concern  . Not on file  Social History Narrative  . Not on file    Family History  Problem Relation Age of Onset  . Sudden death Mother   . Arthritis Father   . Stroke Father   . Breast cancer Sister   . Diabetes Grandchild     Allergies  Allergen Reactions  . Amoxicillin Hives and Nausea Only  . Gabapentin Other (See Comments)    AMS Confusion  . Mirtazapine Itching    Dry mouth with ODT Denies SOB with medication  . Other     UNKNOWN PAIN MEDICATION - unknown reaction     Review of Systems   Review of Systems: Negative Unless Checked Constitutional: _0 Weight loss  _1 Fever  _2 Chills Cardiac: _3 Chest pain   _4  Atrial Fibrillation  _5 Palpitations   _6 Shortness of breath when laying flat   _7 Shortness of breath with exertion. _8 Shortness of breath at rest Vascular:  _9 Pain in legs with walking   _10 Pain in legs with standing _11 Pain in legs when laying flat   _12 Claudication    _13 Pain in feet when laying flat    _14 History of DVT   _15 Phlebitis   _16 Swelling in legs   _17 Varicose veins   _18 Non-healing ulcers Pulmonary:   _19 Uses home oxygen   _20 Productive cough   _21 Hemoptysis   _22 Wheeze  _23 COPD   _24 Asthma Neurologic:  _25 Dizziness   _26 Seizures  _27 Blackouts _28 History of stroke   _29 History of TIA  _30 Aphasia   _31 Temporary Blindness   _32 Weakness or numbness in arm   _33 Weakness or numbness in leg Musculoskeletal:   _34 Joint swelling   _35 Joint pain   _36 Low back pain  _37  History of Knee Replacement _38 Arthritis _39 back Surgeries  _40  Spinal Stenosis    Hematologic:  _41 Easy bruising  _42 Easy bleeding   _43 Hypercoagulable state   _44 Anemic Gastrointestinal:  _45 Diarrhea   _46 Vomiting  _47 Gastroesophageal reflux/heartburn   _48 Difficulty swallowing. _49 Abdominal pain  Genitourinary:  _50 Chronic kidney disease   _51 Difficult urination  _52 Anuric   _53 Blood in urine _54 Frequent urination  _55 Burning with urination   _56 Hematuria Skin:  _57 Rashes   _58 Ulcers _59 Wounds Psychological:  _60 History of anxiety   _61  History of major depression  _62  Memory Difficulties      OBJECTIVE:   Physical Exam  BP (!) 153/83 (BP Location: Right Wrist, Patient Position: Sitting, Cuff Size: Normal)   Pulse 67   Resp 12   Ht 5' (1.524 m)   Wt 88  lb (39.9 kg)   BMI 17.19 kg/m   Gen: WD/WN, NAD Head: [place]/AT, No temporalis wasting.  Ear/Nose/Throat: Hearing grossly intact, nares w/o erythema or drainage Eyes: PER, EOMI, sclera nonicteric.  Neck: Supple, no masses.  No JVD.  Pulmonary:  Good air movement, no use of accessory muscles.  Cardiac: RRR Vascular:  Vessel Right Left  Radial Palpable Palpable  Dorsalis Pedis Palpable   Posterior Tibial Palpable    Gastrointestinal: soft, non-distended. No guarding/no peritoneal signs.  Musculoskeletal: Wheelchair-bound left below-knee amputation.  No deformity or atrophy.  Neurologic: Pain and light touch intact in extremities.  Symmetrical.  Speech is fluent. Motor exam as listed above. Psychiatric: Judgment intact, Mood & affect appropriate for pt's clinical situation. Dermatologic: No Venous rashes. No Ulcers Noted.  No changes consistent with cellulitis. Lymph : No Cervical lymphadenopathy, no lichenification or skin changes of chronic lymphedema.       ASSESSMENT AND PLAN:  1. PAD (peripheral artery disease) (HCC) Recommend:  The patient is status post successful angiogram with intervention.  The patient reports that the claudication symptoms and leg pain is essentially gone.   The patient denies lifestyle limiting changes at this point in time.  No further invasive studies, angiography or surgery at this time The patient should continue walking and begin a more formal exercise program.  The patient should continue  antiplatelet therapy and aggressive treatment of the lipid abnormalities  Smoking cessation was again discussed  The patient should continue wearing graduated compression socks 10-15 mmHg strength to control the mild edema.  Patient should undergo noninvasive studies as ordered. The patient will follow up with me after the studies.   - VAS [place] ABI WITH/WO TBI; Future  2. Gastroesophageal reflux disease without esophagitis Continue PPI as already ordered, this medication has been reviewed and there are no changes at this time.  Avoidence of caffeine and alcohol  Moderate elevation of the head of the bed   3. Type 2 diabetes mellitus with other circulatory complication, with long-term current use of insulin (HCC) Continue hypoglycemic medications as already ordered, these medications have been reviewed and there are no changes at this time.  Hgb A1C to be monitored as already arranged by primary service    Current Outpatient Medications on File Prior to Visit  Medication Sig Dispense Refill  . acetaminophen (TYLENOL) 500 MG tablet Take 1 tablet (500 mg total) by mouth every 6 (six) hours as needed. 30 tablet 0  . amLODipine (NORVASC) 10 MG tablet Take 1 tablet (10 mg total) by mouth daily. 90 tablet 3  . aspirin EC 81 MG tablet Take 1 tablet (81 mg total) by mouth daily. 30 tablet 5  . atorvastatin (LIPITOR) 40 MG tablet Take 1 tablet (40 mg total) by mouth daily. 90 tablet 1  . blood glucose meter kit and supplies KIT Dispense based on patient and insurance preference. Use up to four times daily as directed. (FOR ICD-9 250.00, 250.01). 1 each 0  . clopidogrel (PLAVIX) 75 MG tablet Take 1 tablet (75 mg total) by mouth daily. 90 tablet 3  . cyclobenzaprine (FLEXERIL) 5 MG tablet Take 1 tablet (5 mg total) by mouth at bedtime as needed for muscle spasms. 30 tablet 0  . ezetimibe (ZETIA) 10 MG tablet Take 1 tablet (10 mg total) by mouth daily. 90 tablet 3  . fluticasone (FLONASE) 50  MCG/ACT nasal spray Place 1 spray into both nostrils daily. 16 g 5  . glucose blood (ONE TOUCH TEST STRIPS)  test strip Use to test blood sugar twice daily 200 each 3  . Insulin Glargine, 1 Unit Dial, (TOUJEO SOLOSTAR) 300 UNIT/ML SOPN Inject into the skin. U-300 5 units qhs per Dr. Gabriel Carina    . Insulin Pen Needle (BD PEN NEEDLE NANO U/F) 32G X 4 MM MISC USE 1 DAILY 100 each 12  . Lactulose 20 GM/30ML SOLN Take 30 ml every 4 hours until constipation relieved. 236 mL 3  . loratadine (CLARITIN) 10 MG tablet Take 1 tablet (10 mg total) by mouth daily. 30 tablet 2  . metFORMIN (GLUCOPHAGE) 500 MG tablet Take by mouth.    . metoprolol tartrate (LOPRESSOR) 25 MG tablet Take 1 tablet (25 mg total) by mouth 2 (two) times daily. 180 tablet 0  . nitroGLYCERIN (NITROSTAT) 0.4 MG SL tablet Place 1 tablet (0.4 mg total) under the tongue every 5 (five) minutes as needed for chest pain. 25 tablet 4  . omega-3 acid ethyl esters (LOVAZA) 1 g capsule Take by mouth daily.    . ONE TOUCH LANCETS MISC Use as directed 2 times per day 200 each 3  . sertraline (ZOLOFT) 50 MG tablet Take 1 tablet (50 mg total) by mouth at bedtime. 90 tablet 3  . doxycycline (VIBRA-TABS) 100 MG tablet Take 1 tablet (100 mg total) by mouth 2 (two) times daily. (Patient not taking: Reported on 02/01/2019) 10 tablet 0   No current facility-administered medications on file prior to visit.     There are no Patient Instructions on file for this visit. Return in about 3 months (around 05/11/2019) for PAD.   Kris Hartmann, NP  This note was completed with Sales executive.  Any errors are purely unintentional.

## 2019-02-08 NOTE — Telephone Encounter (Signed)
Call pt  I heard back from vascular Dr Lucky Cowboy after her brain MRI:   He stated :  No that sounds food. For that distal disease, medical therapy is appropriate. Doubt that is causing her headache, and there is really no role for intervention at those locations.  Thanks and have a great day   --  Basically, he doesn't feel cholesterol/ atherosclerosis on MRI is contributory to headache and she may continue medications as is. No intervention needed from vascular.  I am checking on referral to neurology and will be in touch; if she doesn't hear from [place] regarding this referral in 7-10 days, please let us know.  Remind her to have xr of chest at [facility] this week.

## 2019-02-10 ENCOUNTER — Encounter: Payer: Self-pay | Admitting: Family

## 2019-02-10 ENCOUNTER — Ambulatory Visit (INDEPENDENT_AMBULATORY_CARE_PROVIDER_SITE_OTHER): Payer: Medicare Other | Admitting: Family

## 2019-02-10 DIAGNOSIS — R51 Headache: Secondary | ICD-10-CM

## 2019-02-10 DIAGNOSIS — R911 Solitary pulmonary nodule: Secondary | ICD-10-CM | POA: Diagnosis not present

## 2019-02-10 DIAGNOSIS — R339 Retention of urine, unspecified: Secondary | ICD-10-CM | POA: Diagnosis not present

## 2019-02-10 DIAGNOSIS — K5900 Constipation, unspecified: Secondary | ICD-10-CM | POA: Diagnosis not present

## 2019-02-10 DIAGNOSIS — R918 Other nonspecific abnormal finding of lung field: Secondary | ICD-10-CM | POA: Insufficient documentation

## 2019-02-10 DIAGNOSIS — R519 Headache, unspecified: Secondary | ICD-10-CM

## 2019-02-10 MED ORDER — DOCUSATE SODIUM 100 MG PO CAPS: 100.0000 mg | ORAL_CAPSULE | Freq: Two times a day (BID) | ORAL | 0 refills | Status: DC

## 2019-02-10 NOTE — Assessment & Plan Note (Signed)
Again, some concern for possible bladder prolapse.  Urinalysis was normal.  Patient is coming in for physical appointment

## 2019-02-10 NOTE — Progress Notes (Signed)
Verbal consent for services obtained from patient prior to services given to TELEPHONE visit:   Location of call:  provider at work patient at home  Names of all persons present for services: [name], NP Chief complaint:   Still complains of 'hard stool' and straining for weeks, improving. Feels like 'stool gets hung.'  Has been taking lactulose. Not on colace, miralax.   NO rectal bleeding, blood in stool.  No fever, abdominal pain. Notes she thinks she has a hemorrhoid. Last BM was 2 days ago. Drinks 3-4 glasses of water per day. Urine is pale yellow.  RUQ [place] scheduled tomorrow.  No appointment with neurology.  Discussed MRI/MRA brain today. Hasn't had a 'HA in a while.'  H/o glaucoma. No sudden vision changes.  Feels like sometimes a dribble of urine will come out. NO dysuria.  Urine culture 01/25/19 suggest recollection. H/o bladder surgery  history of hysterectomy  LE swelling resolved today.  CXR 02/08/19 - COPD changes Never smoker. No sob, cough, wheezing.Multiple nodular densities project over the right mid and lower lung.   CT chest 2009- no mention of pulmonary nodules.   History, background, results pertinent:    A/P/next steps: Problem List Items Addressed This Visit      Genitourinary   Urinary retention    Again, some concern for possible bladder prolapse.  Urinalysis was normal.  Patient is coming in for physical appointment        Other   Headache    Resolved at this time. Discussed MRI/MRA brain again today. She will await neurology consult.       Constipation - Primary    Improving on lactulose  Advised colace with lactulose if needed for straining.  Some concern for possible obstruction, questionable bladder prolapse.  History of hysterectomy.  Advised patient to come in for a physical visit so we can do pelvic exam.      Relevant Medications   docusate sodium (COLACE) 100 MG capsule   Lung nodule    02/08/2019: seen on CXR Multiple  nodular densities project over the right mid and lower Lung. Pending CT Chest.       Relevant Orders   CT Chest Wo Contrast    Other Visit Diagnoses    Solitary pulmonary nodule       Relevant Orders   CT Chest Wo Contrast       I spent 25 min  discussing plan of care over the phone.

## 2019-02-10 NOTE — Assessment & Plan Note (Signed)
Resolved at this time. Discussed MRI/MRA brain again today. She will await neurology consult.

## 2019-02-10 NOTE — Patient Instructions (Addendum)
Start colace  May continue lactulose in order to have bowel movement daily.   Increase water.   Today we discussed referrals, orders. CT chest for lung nodule   I have placed these orders in the system for you.  Please be sure to give [place] a call if you have not heard from our office regarding this. We should hear from [place] within ONE week with information regarding your appointment. If not, please let me know immediately.   See you 02/19/19 for in person appointment.

## 2019-02-10 NOTE — Assessment & Plan Note (Signed)
Improving on lactulose  Advised colace with lactulose if needed for straining.  Some concern for possible obstruction, questionable bladder prolapse.  History of hysterectomy.  Advised patient to come in for a physical visit so we can do pelvic exam.

## 2019-02-10 NOTE — Assessment & Plan Note (Addendum)
02/08/2019: seen on CXR Multiple nodular densities project over the right mid and lower Lung. Pending CT Chest.

## 2019-02-11 ENCOUNTER — Ambulatory Visit: Admission: RE | Admit: 2019-02-11 | Payer: Medicare Other | Source: Ambulatory Visit

## 2019-02-12 ENCOUNTER — Ambulatory Visit: Payer: Medicare Other

## 2019-02-15 ENCOUNTER — Ambulatory Visit: Payer: Medicare HMO | Admitting: Family

## 2019-02-16 NOTE — Progress Notes (Signed)
Printed and mailed

## 2019-02-17 ENCOUNTER — Ambulatory Visit
Admission: RE | Admit: 2019-02-17 | Discharge: 2019-02-17 | Disposition: A | Discharge status: Home / Self Care | Payer: Medicare Other | Source: Ambulatory Visit | Attending: Family | Admitting: Family

## 2019-02-17 ENCOUNTER — Other Ambulatory Visit: Payer: Self-pay

## 2019-02-17 DIAGNOSIS — R911 Solitary pulmonary nodule: Secondary | ICD-10-CM

## 2019-02-18 ENCOUNTER — Telehealth: Payer: Self-pay | Admitting: Family

## 2019-02-18 ENCOUNTER — Other Ambulatory Visit: Payer: Self-pay | Admitting: Family

## 2019-02-18 DIAGNOSIS — R9389 Abnormal findings on diagnostic imaging of other specified body structures: Secondary | ICD-10-CM

## 2019-02-18 NOTE — Telephone Encounter (Signed)
Patient scheduled for telephone tomorrow at 11:30.

## 2019-02-18 NOTE — Telephone Encounter (Signed)
Call pt I would like to have phone call or in person to discuss CT chest results  Can we schedule for this Friday?  Melissa,   I placed urgent referral to oncology to Dr Tasia Catchings for concern for malignancy on CT Chest  How quickly can we get her set up?

## 2019-02-19 ENCOUNTER — Inpatient Hospital Stay: Payer: Medicare Other

## 2019-02-19 ENCOUNTER — Other Ambulatory Visit: Payer: Self-pay

## 2019-02-19 ENCOUNTER — Ambulatory Visit: Payer: Medicare Other | Admitting: Family

## 2019-02-19 ENCOUNTER — Ambulatory Visit: Payer: Medicare Other | Admitting: Oncology

## 2019-02-19 ENCOUNTER — Inpatient Hospital Stay: Payer: Medicare Other | Attending: Oncology | Admitting: Oncology

## 2019-02-19 ENCOUNTER — Encounter: Payer: Self-pay | Admitting: Family

## 2019-02-19 VITALS — BP 163/74 | HR 67 | Temp 96.8°F | Resp 16 | Ht 60.0 in | Wt 88.0 lb

## 2019-02-19 DIAGNOSIS — K5900 Constipation, unspecified: Secondary | ICD-10-CM | POA: Diagnosis not present

## 2019-02-19 DIAGNOSIS — Z7984 Long term (current) use of oral hypoglycemic drugs: Secondary | ICD-10-CM

## 2019-02-19 DIAGNOSIS — Z794 Long term (current) use of insulin: Secondary | ICD-10-CM

## 2019-02-19 DIAGNOSIS — K219 Gastro-esophageal reflux disease without esophagitis: Secondary | ICD-10-CM | POA: Diagnosis not present

## 2019-02-19 DIAGNOSIS — D571 Sickle-cell disease without crisis: Secondary | ICD-10-CM | POA: Diagnosis not present

## 2019-02-19 DIAGNOSIS — E1122 Type 2 diabetes mellitus with diabetic chronic kidney disease: Secondary | ICD-10-CM | POA: Diagnosis not present

## 2019-02-19 DIAGNOSIS — Z7982 Long term (current) use of aspirin: Secondary | ICD-10-CM

## 2019-02-19 DIAGNOSIS — Z853 Personal history of malignant neoplasm of breast: Secondary | ICD-10-CM

## 2019-02-19 DIAGNOSIS — Z955 Presence of coronary angioplasty implant and graft: Secondary | ICD-10-CM | POA: Diagnosis not present

## 2019-02-19 DIAGNOSIS — E785 Hyperlipidemia, unspecified: Secondary | ICD-10-CM

## 2019-02-19 DIAGNOSIS — D649 Anemia, unspecified: Secondary | ICD-10-CM | POA: Diagnosis not present

## 2019-02-19 DIAGNOSIS — I2510 Atherosclerotic heart disease of native coronary artery without angina pectoris: Secondary | ICD-10-CM | POA: Diagnosis not present

## 2019-02-19 DIAGNOSIS — R918 Other nonspecific abnormal finding of lung field: Secondary | ICD-10-CM

## 2019-02-19 DIAGNOSIS — R591 Generalized enlarged lymph nodes: Secondary | ICD-10-CM

## 2019-02-19 DIAGNOSIS — Z79899 Other long term (current) drug therapy: Secondary | ICD-10-CM

## 2019-02-19 DIAGNOSIS — N183 Chronic kidney disease, stage 3 (moderate): Secondary | ICD-10-CM | POA: Diagnosis not present

## 2019-02-19 DIAGNOSIS — I252 Old myocardial infarction: Secondary | ICD-10-CM | POA: Diagnosis not present

## 2019-02-19 DIAGNOSIS — I129 Hypertensive chronic kidney disease with stage 1 through stage 4 chronic kidney disease, or unspecified chronic kidney disease: Secondary | ICD-10-CM | POA: Diagnosis not present

## 2019-02-19 DIAGNOSIS — Z951 Presence of aortocoronary bypass graft: Secondary | ICD-10-CM | POA: Diagnosis not present

## 2019-02-19 DIAGNOSIS — Z803 Family history of malignant neoplasm of breast: Secondary | ICD-10-CM

## 2019-02-19 LAB — CBC WITH DIFFERENTIAL/PLATELET
Abs Immature Granulocytes: 0.02 10*3/uL (ref 0.00–0.07)
Basophils Absolute: 0 10*3/uL (ref 0.0–0.1)
Basophils Relative: 1 %
Eosinophils Absolute: 0.1 10*3/uL (ref 0.0–0.5)
Eosinophils Relative: 2 %
HCT: 28.7 % — ABNORMAL LOW (ref 36.0–46.0)
Hemoglobin: 9.4 g/dL — ABNORMAL LOW (ref 12.0–15.0)
Immature Granulocytes: 0 %
Lymphocytes Relative: 19 %
Lymphs Abs: 0.9 10*3/uL (ref 0.7–4.0)
MCH: 30.4 pg (ref 26.0–34.0)
MCHC: 32.8 g/dL (ref 30.0–36.0)
MCV: 92.9 fL (ref 80.0–100.0)
Monocytes Absolute: 0.4 10*3/uL (ref 0.1–1.0)
Monocytes Relative: 9 %
Neutro Abs: 3.3 10*3/uL (ref 1.7–7.7)
Neutrophils Relative %: 69 %
Platelets: 290 10*3/uL (ref 150–400)
RBC: 3.09 MIL/uL — ABNORMAL LOW (ref 3.87–5.11)
RDW: 13.6 % (ref 11.5–15.5)
WBC: 4.8 10*3/uL (ref 4.0–10.5)
nRBC: 0 % (ref 0.0–0.2)

## 2019-02-19 LAB — URIC ACID: Uric Acid, Serum: 5.1 mg/dL (ref 2.5–7.1)

## 2019-02-19 LAB — COMPREHENSIVE METABOLIC PANEL
ALT: 15 U/L (ref 0–44)
AST: 19 U/L (ref 15–41)
Albumin: 3.6 g/dL (ref 3.5–5.0)
Alkaline Phosphatase: 79 U/L (ref 38–126)
Anion gap: 8 (ref 5–15)
BUN: 17 mg/dL (ref 8–23)
CO2: 22 mmol/L (ref 22–32)
Calcium: 9 mg/dL (ref 8.9–10.3)
Chloride: 102 mmol/L (ref 98–111)
Creatinine, Ser: 1.07 mg/dL — ABNORMAL HIGH (ref 0.44–1.00)
GFR calc Af Amer: 54 mL/min — ABNORMAL LOW (ref >60)
GFR calc non Af Amer: 46 mL/min — ABNORMAL LOW (ref >60)
Glucose, Bld: 285 mg/dL — ABNORMAL HIGH (ref 70–99)
Potassium: 4.3 mmol/L (ref 3.5–5.1)
Sodium: 132 mmol/L — ABNORMAL LOW (ref 135–145)
Total Bilirubin: 0.5 mg/dL (ref 0.3–1.2)
Total Protein: 6.4 g/dL — ABNORMAL LOW (ref 6.5–8.1)

## 2019-02-19 LAB — LACTATE DEHYDROGENASE: LDH: 145 U/L (ref 98–192)

## 2019-02-19 NOTE — Telephone Encounter (Signed)
Patient scheduled Friday 9/11.

## 2019-02-19 NOTE — Progress Notes (Signed)
Patient is coming in for new patient appointment. She mentions a headache that is constant. 6-7 out of 10 on pain scale. She has been made aware of the no visitor policy.

## 2019-02-19 NOTE — Telephone Encounter (Signed)
She is scheduled today at 11:15 with Dr. Tasia Catchings.

## 2019-02-19 NOTE — Telephone Encounter (Signed)
Call pt Please sch telephone appt with me so we can discuss CT chest.

## 2019-02-20 ENCOUNTER — Encounter: Payer: Self-pay | Admitting: Oncology

## 2019-02-20 NOTE — Progress Notes (Signed)
Hematology/Oncology Consult note [facility] [phone] [phone]   Patient Care Team: Lauretta Grill, NP as PCP - General (Nurse Practitioner)  REFERRING PROVIDER: [name], FNP  CHIEF COMPLAINTS/REASON FOR VISIT:  Evaluation of abnormal CT  HISTORY OF PRESENTING ILLNESS:   Tiffany Bailey is a  85 y.o.  female with PMH listed below was seen in consultation at the request of  [name], FNP  for evaluation of abnormal CT scan. Patient was recently evaluated by primary care provider for complaints of urinary retention, headache, constipation. 02/03/2019 x-ray of abdomen showed large colonic stool volume, nodular densities noted in the right mid lung are up). 02/08/2019 subsequent chest x-ray two-view showed hyperinflation of the lungs compatible with COPD.  Multiple nodular densities project over the mid and lower right lung. CT chest was obtained for further evaluation. 02/17/2019 CT chest without contrast Showed multiple bilateral pulmonary nodules,groundglass opacities. Largest solid nodule at the confluence of the right major and minor fissures measuring 1.6 cm.  Most notable groundglass nodule is of the right upper lobe and measure 1.8 x 0.8 cm, small right pleural effusion with associated atelectasis or consolidation.  Larger solid nodule are highly suspicious for metastatic disease. Bulky right mediastinal and hilar lymph node.  Largest pretracheal nodes measuring 3.5 x 3 cm.  There are additional bulky superior mediastinal, supraclavicular, left subpectoral lymph nodes.  Largest subpectoral nodes measuring 3.3 x 2.5 cm.  Patient was sent to cancer center for further evaluation.  #Patient reports a history of left breast cancer status post mastectomy.  She is a poor historian.  She is not able to recall the timeframe of breast cancer diagnosis and then treatments.  Denies any chemotherapy or radiation treatments.  #Left  lower extremity history of amputation, CKD, History of MI, Sickle cell disease, DM, HTN, anemia.   Patient denies shortness of breath, chest pain, hemoptysis.  Night sweating, abdominal pain. Reports intentional weight loss.  Appetite is poor.  Not eating much. Also have wax and wane, chronic intermittent headache, she takes Tylenol as needed, with some relief.  Denies any nausea vomiting. She sees neurology. 11/10/2018 CT head showed no acute intracranial abnormality or significant interval changes.  Stable atrophy and white matter disease.  Atherosclerosis.  Review of Systems  Constitutional: Positive for appetite change, fatigue and unexpected weight change. Negative for chills and fever.  HENT:   Negative for hearing loss and voice change.   Eyes: Negative for eye problems.  Respiratory: Negative for chest tightness, cough and shortness of breath.   Cardiovascular: Negative for chest pain.  Gastrointestinal: Negative for abdominal distention, abdominal pain and blood in stool.  Endocrine: Negative for hot flashes.  Genitourinary: Negative for difficulty urinating and frequency.   Musculoskeletal: Negative for arthralgias.  Skin: Negative for itching and rash.  Neurological: Positive for headaches. Negative for extremity weakness.  Hematological: Negative for adenopathy.  Psychiatric/Behavioral: Negative for confusion.    MEDICAL HISTORY:  Past Medical History:  Diagnosis Date   Anemia    Anginal pain ([place])    Arthritis    Cancer ([place])    BREAST   CKD (chronic kidney disease), stage III (HCC)    Coronary artery disease    S/P CABG and multiple PCI's   Diabetes mellitus without complication ([place])    Edema    Failure to thrive in adult    GERD (gastroesophageal reflux disease)    Hb-SS disease without crisis ([place]) 10/23/2017   History of  breast cancer    HOH (hard of hearing)    Hyperlipidemia    Hypertension    Myocardial infarction [facility])    Palpitations      SURGICAL HISTORY: Past Surgical History:  Procedure Laterality Date   ABDOMINAL HYSTERECTOMY     ABOVE KNEE LEG AMPUTATION Left 2013   ANTERIOR VITRECTOMY Left 11/16/2015   Procedure: ANTERIOR VITRECTOMY;  Surgeon: Eulogio Bear, MD;  Location: [facility] ORS;  Service: Ophthalmology;  Laterality: Left;   BLADDER SURGERY     CATARACT EXTRACTION W/PHACO Left 11/16/2015   Procedure: CATARACT EXTRACTION PHACO AND INTRAOCULAR LENS PLACEMENT (IOC);  Surgeon: Eulogio Bear, MD;  Location: [facility] ORS;  Service: Ophthalmology;  Laterality: Left;  Lot # [id number] H [place]; 01:22.8 AP%:12.6 CDE: 10.46   CATARACT EXTRACTION W/PHACO Right 08/22/2016   Procedure: CATARACT EXTRACTION PHACO AND INTRAOCULAR LENS PLACEMENT (IOC);  Surgeon: Eulogio Bear, MD;  Location: [facility] ORS;  Service: Ophthalmology;  Laterality: Right;  Lot # [id number] H [place]: 00:52.1 AP%:8.7 CDE: 4.99    CORONARY ANGIOPLASTY     STENT   CORONARY ARTERY BYPASS GRAFT     LOWER EXTREMITY ANGIOGRAPHY Right 01/14/2019   Procedure: LOWER EXTREMITY ANGIOGRAPHY;  Surgeon: Algernon Huxley, MD;  Location: [facility];  Service: Cardiovascular;  Laterality: Right;   MASTECTOMY     TUMOR REMOVAL     ABDOMINAL    SOCIAL HISTORY: Social History   Socioeconomic History   Marital status: Divorced    Spouse name: Not on file   Number of children: 7   Years of education: Not on file   Highest education level: Not on file  Occupational History   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  Social Designer, fashion/clothing strain: Not hard at all   Food insecurity    Worry: Never true    Inability: Never true   Transportation needs    Medical: No    Non-medical: No  Tobacco Use   Smoking status: Never Smoker   Smokeless tobacco: Never Used  Substance and Sexual Activity   Alcohol use: No   Drug use: No   Sexual activity: Not Currently  Lifestyle   Physical activity    Days per week: 7 days     Minutes per session: 20 min   Stress: Only a little  Relationships   Social connections    Talks on phone: More than three times a week    Gets together: Not on file    Attends religious service: Not on file    Active member of club or organization: Not on file    Attends meetings of clubs or organizations: Not on file    Relationship status: Not on file   Intimate partner violence    Fear of current or ex partner: No    Emotionally abused: No    Physically abused: No    Forced sexual activity: No  Other Topics Concern   Not on file  Social History Narrative   Not on file    FAMILY HISTORY: Family History  Problem Relation Age of Onset   Sudden death Mother    Arthritis Father    Stroke Father    Breast cancer Sister    Diabetes Grandchild     ALLERGIES:  is allergic to amoxicillin; gabapentin; mirtazapine; and other.  MEDICATIONS:  Current Outpatient Medications  Medication Sig Dispense Refill   acetaminophen (TYLENOL) 500 MG tablet Take 1 tablet (500 mg total)  by mouth every 6 (six) hours as needed. 30 tablet 0   amLODipine (NORVASC) 10 MG tablet Take 1 tablet (10 mg total) by mouth daily. 90 tablet 3   aspirin EC 81 MG tablet Take 1 tablet (81 mg total) by mouth daily. 30 tablet 5   atorvastatin (LIPITOR) 40 MG tablet Take 1 tablet (40 mg total) by mouth daily. 90 tablet 1   blood glucose meter kit and supplies KIT Dispense based on patient and insurance preference. Use up to four times daily as directed. (FOR ICD-9 250.00, 250.01). 1 each 0   clopidogrel (PLAVIX) 75 MG tablet Take 1 tablet (75 mg total) by mouth daily. 90 tablet 3   cyclobenzaprine (FLEXERIL) 5 MG tablet Take 1 tablet (5 mg total) by mouth at bedtime as needed for muscle spasms. 30 tablet 0   docusate sodium (COLACE) 100 MG capsule Take 1 capsule (100 mg total) by mouth 2 (two) times daily. 10 capsule 0   doxycycline (VIBRA-TABS) 100 MG tablet Take 1 tablet (100 mg total) by mouth 2  (two) times daily. 10 tablet 0   ezetimibe (ZETIA) 10 MG tablet Take 1 tablet (10 mg total) by mouth daily. 90 tablet 3   fluticasone (FLONASE) 50 MCG/ACT nasal spray Place 1 spray into both nostrils daily. 16 g 5   glucose blood (ONE TOUCH TEST STRIPS) test strip Use to test blood sugar twice daily 200 each 3   Insulin Glargine, 1 Unit Dial, (TOUJEO SOLOSTAR) 300 UNIT/ML SOPN Inject into the skin. U-300 5 units qhs per Dr. Gabriel Carina     Insulin Pen Needle (BD PEN NEEDLE NANO U/F) 32G X 4 MM MISC USE 1 DAILY 100 each 12   Lactulose 20 GM/30ML SOLN Take 30 ml every 4 hours until constipation relieved. 236 mL 3   loratadine (CLARITIN) 10 MG tablet Take 1 tablet (10 mg total) by mouth daily. 30 tablet 2   metFORMIN (GLUCOPHAGE) 500 MG tablet Take by mouth.     metoprolol tartrate (LOPRESSOR) 25 MG tablet Take 1 tablet (25 mg total) by mouth 2 (two) times daily. 180 tablet 0   nitroGLYCERIN (NITROSTAT) 0.4 MG SL tablet Place 1 tablet (0.4 mg total) under the tongue every 5 (five) minutes as needed for chest pain. 25 tablet 4   omega-3 acid ethyl esters (LOVAZA) 1 g capsule Take by mouth daily.     ONE TOUCH LANCETS MISC Use as directed 2 times per day 200 each 3   sertraline (ZOLOFT) 50 MG tablet Take 1 tablet (50 mg total) by mouth at bedtime. 90 tablet 3   No current facility-administered medications for this visit.      PHYSICAL EXAMINATION: ECOG PERFORMANCE STATUS: 2 - Symptomatic, <50% confined to bed Vitals:   02/19/19 1021  BP: (!) 163/74  Pulse: 67  Resp: 16  Temp: (!) 96.8 F (36 C)   Filed Weights   02/19/19 1021  Weight: 88 lb (39.9 kg)    Physical Exam Constitutional:      General: She is not in acute distress.    Comments: Sitting in wheel chair.  thin  HENT:     Head: Normocephalic and atraumatic.  Eyes:     General: No scleral icterus.    Pupils: Pupils are equal, round, and reactive to light.  Neck:     Musculoskeletal: Normal range of motion and neck  supple.  Cardiovascular:     Rate and Rhythm: Normal rate and regular rhythm.  Heart sounds: Normal heart sounds.  Pulmonary:     Effort: Pulmonary effort is normal. No respiratory distress.     Breath sounds: No wheezing.     Comments: Severely diminished breath sounds bilaterally.   Abdominal:     General: Bowel sounds are normal. There is no distension.     Palpations: Abdomen is soft. There is no mass.     Tenderness: There is no abdominal tenderness.  Musculoskeletal: Normal range of motion.        General: No deformity.     Comments: Left lower extremity history of amputation.   Skin:    General: Skin is warm and dry.     Findings: No erythema or rash.  Neurological:     Mental Status: She is alert and oriented to person, place, and time.     Cranial Nerves: No cranial nerve deficit.     Coordination: Coordination normal.  Psychiatric:        Behavior: Behavior normal.        Thought Content: Thought content normal.   Breast exam is performed in seated position. Patient is status post left mastectomy. No palpable chest wall mass. No palpable right breast mass.    LABORATORY DATA:  I have reviewed the data as listed Lab Results  Component Value Date   WBC 4.8 02/19/2019   HGB 9.4 (L) 02/19/2019   HCT 28.7 (L) 02/19/2019   MCV 92.9 02/19/2019   PLT 290 02/19/2019   Recent Labs    01/07/19 1356 01/25/19 1425 02/19/19 1145  NA 133* 135 132*  K 4.1 4.7 4.3  CL 100 101 102  CO2 23 25 22   GLUCOSE 321* 194* 285*  BUN 19 17 17   CREATININE 1.10* 1.06* 1.07*  CALCIUM 9.2 9.3 9.0  GFRNONAA 45* 47* 46*  GFRAA 52* 54* 54*  PROT 7.6 6.8 6.4*  ALBUMIN 4.2 3.5 3.6  AST 28 24 19   ALT 25 22 15   ALKPHOS 128* 88 79  BILITOT 0.8 0.6 0.5   Iron/TIBC/Ferritin/ %Sat    Component Value Date/Time   IRON 52 06/19/2018 1006   TIBC 195 (L) 06/19/2018 1006   FERRITIN 104 06/19/2018 1006   IRONPCTSAT 27 06/19/2018 1006      RADIOGRAPHIC STUDIES: I have personally  reviewed the radiological images as listed and agreed with the findings in the report.  Dg Chest 2 View  Result Date: 02/08/2019 CLINICAL DATA:  Follow-up right lung opacity EXAM: CHEST - 2 VIEW COMPARISON:  Abdominal films 02/03/2019 FINDINGS: There is hyperinflation of the lungs compatible with COPD. Multiple nodular densities project over the mid and lower right lung. No nodular opacities or confluent opacity on the left. Heart is normal size. Prior CABG. No effusions or acute bony abnormality. IMPRESSION: Multiple nodular densities project over the right mid and lower lung. Recommend chest CT for further evaluation. COPD. Electronically Signed   By: Rolm Baptise M.D.   On: 02/08/2019 17:08   Ct Chest Wo Contrast  Result Date: 02/17/2019 CLINICAL DATA:  Follow-up lung nodule EXAM: CT CHEST WITHOUT CONTRAST TECHNIQUE: Multidetector CT imaging of the chest was performed following the standard protocol without IV contrast. COMPARISON:  Chest radiograph, 02/08/2019 FINDINGS: Cardiovascular: Aortic atherosclerosis. Normal heart size. Extensive 3 vessel coronary artery calcifications and/or stents. No pericardial effusion. Mediastinum/Nodes: Bulky right mediastinal and hilar lymph nodes, the largest pretracheal nodes measure 3.5 x 3.0 cm (series 2, image 63). There are additional enlarged superior mediastinal, supraclavicular, and left subpectoral lymph nodes,  largest subpectoral node measuring 3.3 x 2.5 cm (series 2, image 35). Thyroid gland, trachea, and esophagus demonstrate no significant findings. Lungs/Pleura: There are multiple bilateral pulmonary nodules and ground-glass opacities, the largest solid nodule a fissural nodule at the confluence of the right major and minor fissures measuring 1.6 cm (series 2, image 76). The most notable ground-glass nodule is of the right upper lobe and measures 1.8 x 0.8 cm (series 2, image 69). Small right pleural effusion with associated atelectasis or consolidation.  Upper Abdomen: No acute abnormality. Large exophytic right renal cyst, partially imaged, and multiple small nonobstructive calculi and/or vascular calcifications of the partially imaged kidneys. Musculoskeletal: No chest wall mass or suspicious bone lesions identified. Status post left mastectomy. IMPRESSION: 1. There are multiple bilateral pulmonary nodules and ground-glass opacities, the largest solid nodule a fissural nodule at the confluence of the right major and minor fissures measuring 1.6 cm (series 2, image 76). The majority of solid pulmonary nodules are present along the fissures and pleural surfaces. The most notable ground-glass nodule is of the right upper lobe and measures 1.8 x 0.8 cm (series 2, image 69). Small right pleural effusion with associated atelectasis or consolidation. The larger solid nodules are highly suspicious for metastatic disease although some smaller and ground-glass nodules are likely incidental and benign sequelae of infection or inflammation. 2. Bulky right mediastinal and hilar lymph nodes, the largest pretracheal nodes measure 3.5 x 3.0 cm (series 2, image 63). There are additional bulky superior mediastinal, supraclavicular, and left subpectoral lymph nodes, largest subpectoral node measuring 3.3 x 2.5 cm (series 2, image 35). 3. Given the presence of left mastectomy, findings are suspicious for recurrent breast malignancy although primary lung malignancy as well as lymphoma are differential considerations. 4.  Coronary artery disease and aortic atherosclerosis. Electronically Signed   By: Eddie Candle M.D.   On: 02/17/2019 10:49   Mr Angiogram Head Wo Contrast  Result Date: 02/04/2019 CLINICAL DATA:  Non intractable headache EXAM: MRI HEAD WITHOUT AND WITH CONTRAST MRA HEAD WITHOUT CONTRAST TECHNIQUE: Multiplanar, multiecho pulse sequences of the brain and surrounding structures were obtained without and with intravenous contrast. Angiographic images of the head were  obtained using MRA technique without contrast. CONTRAST:  4 cc Gadavist intravenous COMPARISON:  Head CT 11/10/2018 FINDINGS: MRI HEAD FINDINGS Brain: No acute infarction, hemorrhage, hydrocephalus, extra-axial collection or mass lesion. Mild small vessel ischemic type change in the cerebral white matter. Vascular: Preserved flow voids.  Arterial findings below Skull and upper cervical spine: Negative for marrow lesion Sinuses/Orbits: Negative MRA HEAD FINDINGS High-grade narrowing of the left vertebral and mid basilar arteries. Atheromatous irregularity of bilateral carotid siphons with flow gap bilaterally. These narrowings may be accentuated by artifact from the skull base. No ACA, MCA, PCA branch occlusion although there is generalized atheromatous narrowing and irregularity. In agreement with prelim IMPRESSION: Brain MRI: Unremarkable for age. Intracranial MRA: Severe atherosclerotic disease with high-grade narrowing at the bilateral carotid siphon, left vertebral, and proximal basilar. Electronically Signed   By: Monte Fantasia M.D.   On: 02/04/2019 10:30   Mr Jeri Cos MB Contrast  Result Date: 02/04/2019 CLINICAL DATA:  Non intractable headache EXAM: MRI HEAD WITHOUT AND WITH CONTRAST MRA HEAD WITHOUT CONTRAST TECHNIQUE: Multiplanar, multiecho pulse sequences of the brain and surrounding structures were obtained without and with intravenous contrast. Angiographic images of the head were obtained using MRA technique without contrast. CONTRAST:  4 cc Gadavist intravenous COMPARISON:  Head CT 11/10/2018 FINDINGS: MRI HEAD FINDINGS  Brain: No acute infarction, hemorrhage, hydrocephalus, extra-axial collection or mass lesion. Mild small vessel ischemic type change in the cerebral white matter. Vascular: Preserved flow voids.  Arterial findings below Skull and upper cervical spine: Negative for marrow lesion Sinuses/Orbits: Negative MRA HEAD FINDINGS High-grade narrowing of the left vertebral and mid basilar  arteries. Atheromatous irregularity of bilateral carotid siphons with flow gap bilaterally. These narrowings may be accentuated by artifact from the skull base. No ACA, MCA, PCA branch occlusion although there is generalized atheromatous narrowing and irregularity. In agreement with prelim IMPRESSION: Brain MRI: Unremarkable for age. Intracranial MRA: Severe atherosclerotic disease with high-grade narrowing at the bilateral carotid siphon, left vertebral, and proximal basilar. Electronically Signed   By: Monte Fantasia M.D.   On: 02/04/2019 10:30   US Venous Img Lower Unilateral Right  Result Date: 01/25/2019 CLINICAL DATA:  Leg swelling. EXAM: RIGHT LOWER EXTREMITY VENOUS DOPPLER ULTRASOUND TECHNIQUE: Gray-scale sonography with graded compression, as well as color Doppler and duplex ultrasound were performed to evaluate the lower extremity deep venous systems from the level of the common femoral vein and including the common femoral, femoral, profunda femoral, popliteal and calf veins including the posterior tibial, peroneal and gastrocnemius veins when visible. The superficial great saphenous vein was also interrogated. Spectral Doppler was utilized to evaluate flow at rest and with distal augmentation maneuvers in the common femoral, femoral and popliteal veins. COMPARISON:  None. FINDINGS: Contralateral Common Femoral Vein: Respiratory phasicity is normal and symmetric with the symptomatic side. No evidence of thrombus. Normal compressibility. Common Femoral Vein: No evidence of thrombus. Normal compressibility, respiratory phasicity and response to augmentation. Saphenofemoral Junction: No evidence of thrombus. Normal compressibility and flow on color Doppler imaging. Profunda Femoral Vein: No evidence of thrombus. Normal compressibility and flow on color Doppler imaging. Femoral Vein: No evidence of thrombus. Normal compressibility, respiratory phasicity and response to augmentation. Popliteal Vein: No  evidence of thrombus. Normal compressibility, respiratory phasicity and response to augmentation. Calf Veins: No evidence of thrombus. Normal compressibility and flow on color Doppler imaging. Other Findings:  None. IMPRESSION: No evidence of deep venous thrombosis. Electronically Signed   By: Marcello Moores  Register   On: 01/25/2019 14:00   Dg Abd 2 Views  Result Date: 02/03/2019 CLINICAL DATA:  Constipation. EXAM: ABDOMEN - 2 VIEW COMPARISON:  None. FINDINGS: Upright film shows no evidence for intraperitoneal free air. Nodular opacities are seen in the right mid lung. Supine abdomen shows large stool volume with gaseous distention of transverse colon. No gaseous small bowel dilatation. Rounded calcification along the left pelvic sidewall seen on previous CT of 11/04/2014 to be in the right ovary. Bones are diffusely demineralized. IMPRESSION: Large colonic stool volume could be compatible with clinical constipation. Nodular densities noted in the right mid lung on upright film. Dedicated two view chest x-ray recommended to further evaluate. These results will be called to the ordering clinician or representative by the Radiologist Assistant, and communication documented in the PACS or zVision Dashboard. Electronically Signed   By: Misty Stanley M.D.   On: 02/03/2019 17:13   Abi With/wo Tbi  Result Date: 02/12/2019 LOWER EXTREMITY DOPPLER STUDY Indications: Peripheral artery disease, and Left BKA              Right heel pain.  Vascular Interventions: 01/14/2019 PTA Rt peron A, tibioperoneal trunk and                         popliteal A. PTA and  stent Rt SFA. Comparison Study: 01/07/2019 Performing Technologist: Charlane Ferretti RT (R)(VS)  Examination Guidelines: A complete evaluation includes at minimum, Doppler waveform signals and systolic blood pressure reading at the level of bilateral brachial, anterior tibial, and posterior tibial arteries, when vessel segments are accessible. Bilateral testing is considered  an integral part of a complete examination. Photoelectric Plethysmograph (PPG) waveforms and toe systolic pressure readings are included as required and additional duplex testing as needed. Limited examinations for reoccurring indications may be performed as noted.  ABI Findings: +---------+------------------+-----+----------+--------+  Right     Rt Pressure (mmHg) Index Waveform   Comment   +---------+------------------+-----+----------+--------+  Brachial  171                                           +---------+------------------+-----+----------+--------+  ATA       161                0.94  monophasic           +---------+------------------+-----+----------+--------+  PTA       161                0.94  monophasic           +---------+------------------+-----+----------+--------+  Great Toe 84                 0.49  Dampened             +---------+------------------+-----+----------+--------+ +--------+------------------+-----+--------+--------------------+  Left     Lt Pressure (mmHg) Index Waveform Comment               +--------+------------------+-----+--------+--------------------+  Brachial                                   Left mastectomy, BKA  +--------+------------------+-----+--------+--------------------+ +-------+-----------+-----------+------------+------------+  ABI/TBI Today's ABI Today's TBI Previous ABI Previous TBI  +-------+-----------+-----------+------------+------------+  Right   .94         .49         .57          .14           +-------+-----------+-----------+------------+------------+ Right ABIs appear increased compared to prior study on 01/07/2019. Right TBIs appear increased compared to prior study on 01/07/2019. Summary: Right: Resting right ankle-brachial index indicates mild right lower extremity arterial disease. The right toe-brachial index is abnormal.  *See table(s) above for measurements and observations.  Electronically signed by Leotis Pain MD on 02/12/2019 at 12:14:46 PM.   Final        ASSESSMENT & PLAN:  1. Lung mass   2. Lymphadenopathy   3. History of breast cancer   4. Normocytic anemia    #CT images were independently viewed by me and discussed with patient. I also called patient's daughter and updated her. Concerning for metastatic disease versus lymphoma Patient has a history of breast cancer, no pathology or oncology report available in current EMR. Daughter provided information of surgery possibly being done by Dr. Tollie Pizza.  We will obtain records from old EMR. Recommend patient to obtain PET scan for further evaluation, staging and determination of biopsy location. Obtain baseline blood work including CBC and CMP, LDH, uric acid, flow cytometry.  Orders Placed This Encounter  Procedures   NM PET Image Initial (PI) Skull Base To Thigh    Standing Status:  Future    Standing Expiration Date:   02/19/2020    Order Specific Question:   If indicated for the ordered procedure, I authorize the administration of a radiopharmaceutical per Radiology protocol    Answer:   Yes    Order Specific Question:   Preferred imaging location?    Answer:   [facility]    Order Specific Question:   Radiology Contrast Protocol - do NOT remove file path    Answer:   [email or web address]   CBC with Differential/Platelet    Standing Status:   Future    Number of Occurrences:   1    Standing Expiration Date:   02/19/2020   Comprehensive metabolic panel    Standing Status:   Future    Number of Occurrences:   1    Standing Expiration Date:   02/19/2020   Lactate dehydrogenase    Standing Status:   Future    Number of Occurrences:   1    Standing Expiration Date:   02/19/2020   Uric acid   Flow cytometry panel-leukemia/lymphoma work-up    Standing Status:   Future    Number of Occurrences:   1    Standing Expiration Date:   02/19/2020    All questions were answered. The patient knows to call the clinic with any problems questions or  concerns.  cc [name], FNP    Return of visit: to be determined.  Thank you for this kind referral and the opportunity to participate in the care of this patient. A copy of today's note is routed to referring provider  Total face to face encounter time for this patient visit was 60 min. >50% of the time was  spent in counseling and coordination of care.    [name], MD, PhD Hematology Oncology [facility] at [facility] Pager- [phone] 02/20/2019

## 2019-02-24 LAB — COMP PANEL: LEUKEMIA/LYMPHOMA

## 2019-02-25 ENCOUNTER — Other Ambulatory Visit: Payer: Self-pay

## 2019-02-25 ENCOUNTER — Encounter
Admission: RE | Admit: 2019-02-25 | Discharge: 2019-02-25 | Disposition: A | Discharge status: Home / Self Care | Payer: Medicare Other | Source: Ambulatory Visit | Attending: Oncology | Admitting: Oncology

## 2019-02-25 DIAGNOSIS — R918 Other nonspecific abnormal finding of lung field: Secondary | ICD-10-CM | POA: Diagnosis not present

## 2019-02-25 DIAGNOSIS — I2510 Atherosclerotic heart disease of native coronary artery without angina pectoris: Secondary | ICD-10-CM | POA: Insufficient documentation

## 2019-02-25 DIAGNOSIS — R591 Generalized enlarged lymph nodes: Secondary | ICD-10-CM

## 2019-02-25 DIAGNOSIS — I700 Atherosclerosis of aorta: Secondary | ICD-10-CM | POA: Insufficient documentation

## 2019-02-25 LAB — GLUCOSE, CAPILLARY: Glucose-Capillary: 88 mg/dL (ref 70–99)

## 2019-02-25 MED ORDER — FLUDEOXYGLUCOSE F - 18 (FDG) INJECTION
5.3000 | Freq: Once | INTRAVENOUS | Status: AC | PRN
  Administered 2019-02-25: 5.3 via INTRAVENOUS

## 2019-02-26 ENCOUNTER — Ambulatory Visit (INDEPENDENT_AMBULATORY_CARE_PROVIDER_SITE_OTHER): Payer: Medicare Other | Admitting: Family

## 2019-02-26 ENCOUNTER — Ambulatory Visit: Payer: Medicare Other | Admitting: Family

## 2019-02-26 ENCOUNTER — Other Ambulatory Visit: Payer: Self-pay

## 2019-02-26 ENCOUNTER — Ambulatory Visit: Payer: Medicare Other | Admitting: Oncology

## 2019-02-26 ENCOUNTER — Other Ambulatory Visit (INDEPENDENT_AMBULATORY_CARE_PROVIDER_SITE_OTHER): Payer: Medicare Other

## 2019-02-26 ENCOUNTER — Telehealth: Payer: Self-pay

## 2019-02-26 DIAGNOSIS — R51 Headache: Secondary | ICD-10-CM

## 2019-02-26 DIAGNOSIS — E1165 Type 2 diabetes mellitus with hyperglycemia: Secondary | ICD-10-CM

## 2019-02-26 DIAGNOSIS — Z853 Personal history of malignant neoplasm of breast: Secondary | ICD-10-CM

## 2019-02-26 DIAGNOSIS — K5900 Constipation, unspecified: Secondary | ICD-10-CM

## 2019-02-26 DIAGNOSIS — R519 Headache, unspecified: Secondary | ICD-10-CM

## 2019-02-26 DIAGNOSIS — I25118 Atherosclerotic heart disease of native coronary artery with other forms of angina pectoris: Secondary | ICD-10-CM

## 2019-02-26 DIAGNOSIS — R109 Unspecified abdominal pain: Secondary | ICD-10-CM | POA: Diagnosis not present

## 2019-02-26 DIAGNOSIS — R591 Generalized enlarged lymph nodes: Secondary | ICD-10-CM

## 2019-02-26 LAB — CBC WITH DIFFERENTIAL/PLATELET
Basophils Absolute: 0.1 10*3/uL (ref 0.0–0.1)
Basophils Relative: 1.1 % (ref 0.0–3.0)
Eosinophils Absolute: 0.1 10*3/uL (ref 0.0–0.7)
Eosinophils Relative: 1.8 % (ref 0.0–5.0)
HCT: 29.7 % — ABNORMAL LOW (ref 36.0–46.0)
Hemoglobin: 10 g/dL — ABNORMAL LOW (ref 12.0–15.0)
Lymphocytes Relative: 21.3 % (ref 12.0–46.0)
Lymphs Abs: 1 10*3/uL (ref 0.7–4.0)
MCHC: 33.8 g/dL (ref 30.0–36.0)
MCV: 91.2 fl (ref 78.0–100.0)
Monocytes Absolute: 0.4 10*3/uL (ref 0.1–1.0)
Monocytes Relative: 7.6 % (ref 3.0–12.0)
Neutro Abs: 3.3 10*3/uL (ref 1.4–7.7)
Neutrophils Relative %: 68.2 % (ref 43.0–77.0)
Platelets: 337 10*3/uL (ref 150.0–400.0)
RBC: 3.25 Mil/uL — ABNORMAL LOW (ref 3.87–5.11)
RDW: 14.5 % (ref 11.5–15.5)
WBC: 4.9 10*3/uL (ref 4.0–10.5)

## 2019-02-26 LAB — COMPREHENSIVE METABOLIC PANEL
ALT: 10 U/L (ref 0–35)
AST: 17 U/L (ref 0–37)
Albumin: 3.8 g/dL (ref 3.5–5.2)
Alkaline Phosphatase: 74 U/L (ref 39–117)
BUN: 23 mg/dL (ref 6–23)
CO2: 25 mEq/L (ref 19–32)
Calcium: 9.6 mg/dL (ref 8.4–10.5)
Chloride: 106 mEq/L (ref 96–112)
Creatinine, Ser: 1.06 mg/dL (ref 0.40–1.20)
GFR: 59.06 mL/min — ABNORMAL LOW (ref >60.00)
Glucose, Bld: 55 mg/dL — ABNORMAL LOW (ref 70–99)
Potassium: 4 mEq/L (ref 3.5–5.1)
Sodium: 140 mEq/L (ref 135–145)
Total Bilirubin: 0.5 mg/dL (ref 0.2–1.2)
Total Protein: 6.1 g/dL (ref 6.0–8.3)

## 2019-02-26 LAB — LIPID PANEL
Cholesterol: 136 mg/dL (ref 0–200)
HDL: 46.5 mg/dL (ref >39.00)
LDL Cholesterol: 76 mg/dL (ref 0–99)
NonHDL: 89.05
Total CHOL/HDL Ratio: 3
Triglycerides: 63 mg/dL (ref 0.0–149.0)
VLDL: 12.6 mg/dL (ref 0.0–40.0)

## 2019-02-26 LAB — HEMOGLOBIN A1C: Hgb A1c MFr Bld: 8.2 % — ABNORMAL HIGH (ref 4.6–6.5)

## 2019-02-26 LAB — LIPASE: Lipase: 34 U/L (ref 11.0–59.0)

## 2019-02-26 NOTE — Progress Notes (Signed)
Verbal consent for services obtained from patient prior to services given to TELEPHONE visit:   Location of call:  provider at work patient at home  Names of all persons present for services: [name], NP Chief complaint:   Feels well today. No complaints.  NO HA today. HA are still 'every once in a while.'   Appointment to Discuss CT  Going to bathroom well now, no constipation. No longer on colace, lactulose.   Following with Dr Tasia Catchings in regards to pulmonary nodules, and enlarged lymph nodes.   CAD/ atherosclerosis- on lipitor. No Cp. Follows with Dr Rockey Situ for the severe coronary artery disease, h/o stent;  Has seen [facility] as well.   Neurology appointment today, however missed today, with Dr Manuella Ghazi / Dr Melrose Nakayama.    History, background, results pertinent:    A/P/next steps:  Problem List Items Addressed This Visit      Other   CAD (coronary artery disease), native coronary artery    Appears asymptomatic at this time. emphasized the importance of continued follow-up with Dr. Rockey Situ, Dr. Lucky Cowboy which patient understands.  She will remain on Lipitor.      Headache    Missed neurology appointment today. No HA today.  I have given phone number [phone] to [facility] to re-schedule which patient states she will call.        Constipation    Resolved.  Will follow          I spent 10 min  discussing plan of care over the phone.

## 2019-02-26 NOTE — Telephone Encounter (Signed)
-----   Message from [name], MD sent at 02/26/2019  8:48 AM EDT ----- Called patient's daughter and discussed PET scan results.  Recommend tissue biopsy. Please arrange US guided left axillary lymph node biopsy.  Please also arrange virtual visit [if feasible] or in person follow up 4 days after biopsy. Thank you.

## 2019-02-26 NOTE — Assessment & Plan Note (Addendum)
Resolved. Will follow.  

## 2019-02-26 NOTE — Assessment & Plan Note (Addendum)
Missed neurology appointment today. No HA today.  I have given phone number [phone] to [facility] Neurology to re-schedule which patient states she will call.

## 2019-02-26 NOTE — Assessment & Plan Note (Addendum)
Appears asymptomatic at this time. emphasized the importance of continued follow-up with Dr. Rockey Situ, Dr. Lucky Cowboy which patient understands.  She will remain on Lipitor.

## 2019-02-26 NOTE — Telephone Encounter (Signed)
Order for US guided bx entered and form faxed to specialty scheduling

## 2019-02-26 NOTE — Addendum Note (Signed)
Addended by: Leeanne Rio on: 02/26/2019 09:21 AM   Modules accepted: Orders

## 2019-03-01 ENCOUNTER — Telehealth: Payer: Self-pay | Admitting: Cardiovascular Disease

## 2019-03-01 NOTE — Telephone Encounter (Signed)
Called Dr. Roney Marion office to ask for medication clearance for patient's Plavix in order to have her biopsy done. Awaiting for response.

## 2019-03-01 NOTE — Telephone Encounter (Signed)
    Dr. Rockey Situ could you please address this patient's Plavix?  You last saw her 11/24/2018 for follow-up of PAD and CAD.  She has a history of CABG x3 in 2002 with non-STEMI in 2013 with PCI/stent placement.  Also with a history of PAD s/p left BKA in 2013.  There are plans for an ultrasound-guided lymph node biopsy at [facility] with Dr. Tasia Catchings.  They are asking if it would be acceptable to hold her Plavix x2 days prior to this procedure.  Please send her recommendations to the preoperative pool.  Thank you  Sharee Pimple

## 2019-03-01 NOTE — Telephone Encounter (Signed)
error 

## 2019-03-01 NOTE — Telephone Encounter (Signed)
° °  [facility] Pre-operative Risk Assessment    Request for surgical clearance:  1. What type of surgery is being performed? U/S guided Lympnode Biopsy    2. When is this surgery scheduled?  TBD  3. What type of clearance is required (Medical clearance vs. Pharmacy clearance to hold med vs. Both)? Pharmacy   4. Are there any medications that need to be held prior to surgery and how long? Plavix x 2 days    5. Practice name and name of physician performing surgery? [facility]. Tasia Catchings   6. What is your office phone number [phone]   7.   What is your office fax number [phone]  8.   Anesthesia type (None, local, MAC, general) ? Local    Clarisse Gouge 03/01/2019, 11:52 AM  _________________________________________________________________   (provider comments below)

## 2019-03-02 ENCOUNTER — Other Ambulatory Visit: Payer: Self-pay | Admitting: Internal Medicine

## 2019-03-02 DIAGNOSIS — Z794 Long term (current) use of insulin: Secondary | ICD-10-CM

## 2019-03-02 DIAGNOSIS — E1165 Type 2 diabetes mellitus with hyperglycemia: Secondary | ICD-10-CM

## 2019-03-02 MED ORDER — BASAGLAR KWIKPEN 100 UNIT/ML ~~LOC~~ SOPN: 6.0000 [IU] | PEN_INJECTOR | Freq: Every day | SUBCUTANEOUS | Status: DC

## 2019-03-03 NOTE — Telephone Encounter (Signed)
Left message to call our office if they have not received clearance letter was faxed over today.

## 2019-03-03 NOTE — Telephone Encounter (Signed)
Acceptable risk to hold the Plavix for 2 days Restart following procedure Would stay on low-dose aspirin

## 2019-03-03 NOTE — Telephone Encounter (Signed)
   Call back staff: Marland Kitchen This phone note has been faxed to the requesting surgeon. . Please contact the surgeon's office to ensure it has been received. . This phone note will be removed from the preop pool.  Richardson Dopp, PA-C    03/03/2019 3:14 PM

## 2019-03-03 NOTE — Telephone Encounter (Signed)
   Primary Cardiologist: Ida Rogue, MD  Chart reviewed as part of pre-operative protocol coverage.   Please see recommendations below from Dr. Rockey Situ regarding Plavix and Aspirin. Please call with questions.  Richardson Dopp, PA-C 03/03/2019, 3:11 PM

## 2019-03-04 NOTE — Telephone Encounter (Signed)
Pamala Hurry reports that she received cardiac clearance for patient to hold plavix prior to lymph node biopsy. Pamala Hurry shared with radiologist who felt that Plavix needed to be held for 5 days.   Procedure scheduled for 9/24.   Spoke with Dr. Saunders Revel (DOD) who reviewed chart and gave verbal for patient to hold Plavix for 5 days.   Sent message to [place] with update. Advised her to call with any further questions or concerns.

## 2019-03-04 NOTE — Telephone Encounter (Signed)
Patient informed of biopsy appointment and instructions.  Also advised to hold Plavix 2 days prior and continue the ASA then resume Plavix after procedure.

## 2019-03-05 ENCOUNTER — Other Ambulatory Visit: Payer: Medicare Other

## 2019-03-05 ENCOUNTER — Other Ambulatory Visit: Payer: Self-pay

## 2019-03-05 ENCOUNTER — Telehealth: Payer: Self-pay

## 2019-03-05 ENCOUNTER — Telehealth: Payer: Self-pay | Admitting: *Deleted

## 2019-03-05 DIAGNOSIS — E1165 Type 2 diabetes mellitus with hyperglycemia: Secondary | ICD-10-CM

## 2019-03-05 NOTE — Telephone Encounter (Signed)
Dr. Tasia Catchings wanted me to forward this to you as an FYI.

## 2019-03-05 NOTE — Telephone Encounter (Signed)
Noted  Patient has chosen to cancel biopsy

## 2019-03-05 NOTE — Telephone Encounter (Signed)
t called reporting that she wants to cancel the biopsy and that she does not want to have it done at all. Please cancel appointment.

## 2019-03-05 NOTE — Telephone Encounter (Signed)
Informed patient that the bx has been cancelled.  I will make PCP aware of her choice and we will cancel the MD f/u.

## 2019-03-05 NOTE — Telephone Encounter (Signed)
Call received by patient's granddaughter, Tammy, requesting that we make sure patient understands about a biopsy procedure and she is concerned that patient will cancel her bx appt.  I called Tammy back and left a voicemail that I was not able to go into detail with her regarding patient's medical care.  I did call the patient and talk to her about the biopsy.  She seemed very aware of the procedure and says that she thinks she has been through enough and doesn't want to to anymore.  Patient advised that if she changes her mind after thinking and talking with her family more to give [place] a call

## 2019-03-05 NOTE — Addendum Note (Signed)
Addended by: Leeanne Rio on: 03/05/2019 12:10 PM   Modules accepted: Orders

## 2019-03-06 LAB — MICROALBUMIN / CREATININE URINE RATIO
Creatinine, Urine: 29.6 mg/dL
Microalb/Creat Ratio: <10 mg/g creat (ref 0–29)
Microalbumin, Urine: <3 ug/mL

## 2019-03-06 LAB — URINALYSIS, ROUTINE W REFLEX MICROSCOPIC
Bilirubin, UA: NEGATIVE
Ketones, UA: NEGATIVE
Leukocytes,UA: NEGATIVE
Nitrite, UA: NEGATIVE
Protein,UA: NEGATIVE
RBC, UA: NEGATIVE
Specific Gravity, UA: 1.007 (ref 1.005–1.030)
Urobilinogen, Ur: 0.2 mg/dL (ref 0.2–1.0)
pH, UA: 7 (ref 5.0–7.5)

## 2019-03-11 ENCOUNTER — Ambulatory Visit: Payer: Medicare Other

## 2019-03-17 ENCOUNTER — Ambulatory Visit: Payer: Medicare Other | Admitting: Oncology

## 2019-04-01 ENCOUNTER — Other Ambulatory Visit: Payer: Self-pay | Admitting: Family

## 2019-04-12 ENCOUNTER — Other Ambulatory Visit: Payer: Self-pay

## 2019-04-12 ENCOUNTER — Ambulatory Visit (INDEPENDENT_AMBULATORY_CARE_PROVIDER_SITE_OTHER): Payer: Medicare Other

## 2019-04-12 DIAGNOSIS — Z23 Encounter for immunization: Secondary | ICD-10-CM | POA: Diagnosis not present

## 2019-05-07 ENCOUNTER — Other Ambulatory Visit (INDEPENDENT_AMBULATORY_CARE_PROVIDER_SITE_OTHER): Payer: Self-pay | Admitting: Vascular Surgery

## 2019-05-07 DIAGNOSIS — I739 Peripheral vascular disease, unspecified: Secondary | ICD-10-CM

## 2019-05-07 DIAGNOSIS — Z95820 Peripheral vascular angioplasty status with implants and grafts: Secondary | ICD-10-CM

## 2019-05-12 ENCOUNTER — Other Ambulatory Visit: Payer: Self-pay

## 2019-05-12 ENCOUNTER — Ambulatory Visit (INDEPENDENT_AMBULATORY_CARE_PROVIDER_SITE_OTHER): Payer: Medicare Other

## 2019-05-12 ENCOUNTER — Ambulatory Visit (INDEPENDENT_AMBULATORY_CARE_PROVIDER_SITE_OTHER): Payer: Medicare Other | Admitting: Nurse Practitioner

## 2019-05-12 ENCOUNTER — Encounter (INDEPENDENT_AMBULATORY_CARE_PROVIDER_SITE_OTHER): Payer: Self-pay | Admitting: Nurse Practitioner

## 2019-05-12 VITALS — BP 186/67 | HR 76 | Resp 16 | Ht 60.0 in | Wt 82.0 lb

## 2019-05-12 DIAGNOSIS — I739 Peripheral vascular disease, unspecified: Secondary | ICD-10-CM

## 2019-05-12 DIAGNOSIS — Z95820 Peripheral vascular angioplasty status with implants and grafts: Secondary | ICD-10-CM | POA: Diagnosis not present

## 2019-05-12 DIAGNOSIS — E785 Hyperlipidemia, unspecified: Secondary | ICD-10-CM

## 2019-05-12 DIAGNOSIS — G546 Phantom limb syndrome with pain: Secondary | ICD-10-CM | POA: Diagnosis not present

## 2019-05-12 MED ORDER — HYDROCODONE-ACETAMINOPHEN 5-325 MG PO TABS: 1.0000 | ORAL_TABLET | Freq: Four times a day (QID) | ORAL | 0 refills | Status: DC | PRN

## 2019-05-12 NOTE — Progress Notes (Signed)
SUBJECTIVE:  Patient ID: Tiffany Bailey, female    DOB: 12-30-1927, 85 y.o.   MRN: [id number] Chief Complaint  Patient presents with  . Follow-up    3 mo abi    HPI  TARAE Bailey is a 85 y.o. female the presents today for noninvasive studies to follow-up for her peripheral artery disease.  The patient underwent angioplasty on 01/14/2019 to the right lower extremity.  Since that time the patient states that her leg feels much better and that it is warm when previously it was cool.  There have been no new wounds since last visit.  The patient does however complain of extensive phantom pain from her left below-knee amputation.  She states that is not necessarily every day however it comes and goes.  She has been given a medication that helps most times however there are times when it does not work and the pain goes on for days.  She denies any fever, chills, nausea, vomiting or diarrhea.  She denies any chest pain or shortness of breath.  Today her noninvasive studies show an ABI of 1.05 with a TBI of 0.70.  Her anterior tibial artery has monophasic waveforms with biphasic posterior tibial artery.  Lower extremity arterial duplex reveals biphasic waveforms throughout the leg except for at the anterior tibial artery.  Previously placed stents are patent.  Patient also has good toe waveforms.  Past Medical History:  Diagnosis Date  . Anemia   . Anginal pain ([place])   . Arthritis   . Cancer (HCC)    BREAST  . CKD (chronic kidney disease), stage III   . Coronary artery disease    S/P CABG and multiple PCI's  . Diabetes mellitus without complication ([place])   . Edema   . Failure to thrive in adult   . GERD (gastroesophageal reflux disease)   . 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 History of breast cancer   . HOH (hard of hearing)   . Hyperlipidemia   . Hypertension   . Myocardial infarction ([place])   . Palpitations     Past Surgical History:  Procedure Laterality Date  .  ABDOMINAL HYSTERECTOMY    . ABOVE KNEE LEG AMPUTATION Left 2013  . ANTERIOR VITRECTOMY Left 11/16/2015   Procedure: ANTERIOR VITRECTOMY;  Surgeon: Eulogio Bear, MD;  Location: [facility] ORS;  Service: Ophthalmology;  Laterality: Left;  . BLADDER SURGERY    . CATARACT EXTRACTION W/PHACO Left 11/16/2015   Procedure: CATARACT EXTRACTION PHACO AND INTRAOCULAR LENS PLACEMENT (IOC);  Surgeon: Eulogio Bear, MD;  Location: [facility] ORS;  Service: Ophthalmology;  Laterality: Left;  Lot # [id number] H [place]; 01:22.8 AP%:12.6 CDE: 10.46  . CATARACT EXTRACTION W/PHACO Right 08/22/2016   Procedure: CATARACT EXTRACTION PHACO AND INTRAOCULAR LENS PLACEMENT (IOC);  Surgeon: Eulogio Bear, MD;  Location: [facility] ORS;  Service: Ophthalmology;  Laterality: Right;  Lot # [id number] H [place]: 00:52.1 AP%:8.7 CDE: 4.99   . CORONARY ANGIOPLASTY     STENT  . CORONARY ARTERY BYPASS GRAFT    . LOWER EXTREMITY ANGIOGRAPHY Right 01/14/2019   Procedure: LOWER EXTREMITY ANGIOGRAPHY;  Surgeon: Algernon Huxley, MD;  Location: [facility];  Service: Cardiovascular;  Laterality: Right;  . MASTECTOMY    . TUMOR REMOVAL     ABDOMINAL    Social History   Socioeconomic History  . Marital status: Divorced    Spouse name: Not on file  . Number of children: 7  . Years of education: Not  on file  . Highest education level: Not on file  Occupational History  . 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 Financial resource strain: Not hard at all  . Food insecurity    Worry: Never true    Inability: Never true  . Transportation needs    Medical: No    Non-medical: No  Tobacco Use  . Smoking status: Never Smoker  . Smokeless tobacco: Never Used  Substance and Sexual Activity  . Alcohol use: No  . Drug use: No  . Sexual activity: Not Currently  Lifestyle  . Physical activity    Days per week: 7 days    Minutes per session: 20 min  . Stress: Only a little  Relationships  . Social connections    Talks  on phone: More than three times a week    Gets together: Not on file    Attends religious service: Not on file    Active member of club or organization: Not on file    Attends meetings of clubs or organizations: Not on file    Relationship status: Not on file  . Intimate partner violence    Fear of current or ex partner: No    Emotionally abused: No    Physically abused: No    Forced sexual activity: No  Other Topics Concern  . Not on file  Social History Narrative  . Not on file    Family History  Problem Relation Age of Onset  . Sudden death Mother   . Arthritis Father   . Stroke Father   . Breast cancer Sister   . Diabetes Grandchild     Allergies  Allergen Reactions  . Amoxicillin Hives and Nausea Only  . Gabapentin Other (See Comments)    AMS Confusion  . Mirtazapine Itching    Dry mouth with ODT Denies SOB with medication  . Other     UNKNOWN PAIN MEDICATION - unknown reaction     Review of Systems   Review of Systems: Negative Unless Checked Constitutional: []Weight loss  []Fever  []Chills Cardiac: []Chest pain   [] Atrial Fibrillation  []Palpitations   []Shortness of breath when laying flat   []Shortness of breath with exertion. []Shortness of breath at rest Vascular:  []Pain in legs with walking   []Pain in legs with standing []Pain in legs when laying flat   []Claudication    []Pain in feet when laying flat    []History of DVT   []Phlebitis   [x]Swelling in legs   []Varicose veins   []Non-healing ulcers Pulmonary:   []Uses home oxygen   []Productive cough   []Hemoptysis   []Wheeze  []COPD   []Asthma Neurologic:  []Dizziness   []Seizures  []Blackouts []History of stroke   []History of TIA  []Aphasia   []Temporary Blindness   []Weakness or numbness in arm   [x]Weakness or numbness in leg Musculoskeletal:   []Joint swelling   []Joint pain   []Low back pain  [] History of Knee Replacement []Arthritis []back Surgeries  [] Spinal Stenosis    Hematologic:  []Easy  bruising  []Easy bleeding   []Hypercoagulable state   []Anemic Gastrointestinal:  []Diarrhea   []Vomiting  [x]Gastroesophageal reflux/heartburn   []Difficulty swallowing. []Abdominal pain Genitourinary:  []Chronic kidney disease   []Difficult urination  []Anuric   []Blood in urine []Frequent urination  []Burning with urination   []Hematuria Skin:  []Rashes   []Ulcers []Wounds Psychological:  []History of  anxiety   [x] History of major depression  [] Memory Difficulties      OBJECTIVE:   Physical Exam  BP (!) 186/67 (BP Location: Right Arm)   Pulse 76   Resp 16   Ht 5' (1.524 m)   Wt 82 lb (37.2 kg)   BMI 16.01 kg/m   Gen: WD/WN, NAD, patient appears frail Head: [place]/AT, No temporalis wasting.  Ear/Nose/Throat: Hearing grossly intact, nares w/o erythema or drainage Eyes: PER, EOMI, sclera nonicteric.  Neck: Supple, no masses.  No JVD.  Pulmonary:  Good air movement, no use of accessory muscles.  Cardiac: RRR Vascular:  Vessel Right Left  Radial Palpable Palpable  Dorsalis Pedis Palpable   Posterior Tibial Palpable    Gastrointestinal: soft, non-distended. No guarding/no peritoneal signs.  Musculoskeletal: Left below-knee amputation.  No deformity or atrophy.  Neurologic: Pain and light touch intact in extremities.  Symmetrical.  Speech is fluent. Motor exam as listed above. Psychiatric: Judgment intact, Mood & affect appropriate for pt's clinical situation. Dermatologic: No Venous rashes. No Ulcers Noted.  No changes consistent with cellulitis. Lymph : No Cervical lymphadenopathy, no lichenification or skin changes of chronic lymphedema.       ASSESSMENT AND PLAN:  1. Phantom pain after amputation of lower extremity ([place]) The patient states that she has some medication that she takes at night for her phantom limb pain however she is unsure of what it is and the medication is not here at the visit.  Although it sounds somewhat like gabapentin.  I have given her some pain  medication today and she is instructed to only utilize it when her other medication is not working and she has been in pain consistently.  I have also advised patient that if this medication makes her too sleepy she can take half pill. - HYDROcodone-acetaminophen (NORCO/VICODIN) 5-325 MG tablet; Take 1 tablet by mouth every 6 (six) hours as needed for moderate pain (Phantom Pain).  Dispense: 30 tablet; Refill: 0  2. PAD (peripheral artery disease) (HCC)  Recommend:  The patient has evidence of atherosclerosis of the lower extremities with claudication.  The patient does not voice lifestyle limiting changes at this point in time.  Noninvasive studies do not suggest clinically significant change.  No invasive studies, angiography or surgery at this time The patient should continue walking and begin a more formal exercise program.  The patient should continue antiplatelet therapy and aggressive treatment of the lipid abnormalities  No changes in the patient's medications at this time  The patient should continue wearing graduated compression socks 10-15 mmHg strength to control the mild edema.   Patient will follow up in 6 months.  3. Hyperlipidemia, unspecified hyperlipidemia type Continue statin as ordered and reviewed, no changes at this time    Current Outpatient Medications on File Prior to Visit  Medication Sig Dispense Refill  . acetaminophen (TYLENOL) 500 MG tablet Take 1 tablet (500 mg total) by mouth every 6 (six) hours as needed. 30 tablet 0  . amLODipine (NORVASC) 10 MG tablet Take 1 tablet (10 mg total) by mouth daily. 90 tablet 3  . aspirin EC 81 MG tablet Take 1 tablet (81 mg total) by mouth daily. 30 tablet 5  . blood glucose meter kit and supplies KIT Dispense based on patient and insurance preference. Use up to four times daily as directed. (FOR ICD-9 250.00, 250.01). 1 each 0  . clopidogrel (PLAVIX) 75 MG tablet Take 1 tablet (75 mg total) by mouth daily.  90 tablet 3   . glucose blood (ONE TOUCH TEST STRIPS) test strip Use to test blood sugar twice daily 200 each 3  . Insulin Glargine (BASAGLAR KWIKPEN) 100 UNIT/ML SOPN Inject 0.06 mLs (6 Units total) into the skin daily. [place] [facility] as of  01/21/2019    . Insulin Pen Needle (BD PEN NEEDLE NANO U/F) 32G X 4 MM MISC USE 1 DAILY 100 each 12  . metFORMIN (GLUCOPHAGE) 500 MG tablet Take by mouth.    . metoprolol tartrate (LOPRESSOR) 25 MG tablet TAKE 1 TABLET BY MOUTH TWO  TIMES DAILY 180 tablet 3  . ONE TOUCH LANCETS MISC Use as directed 2 times per day 200 each 3  . atorvastatin (LIPITOR) 40 MG tablet Take 1 tablet (40 mg total) by mouth daily. (Patient not taking: Reported on 05/12/2019) 90 tablet 1  . cyclobenzaprine (FLEXERIL) 5 MG tablet Take 1 tablet (5 mg total) by mouth at bedtime as needed for muscle spasms. (Patient not taking: Reported on 05/12/2019) 30 tablet 0  . doxycycline (VIBRA-TABS) 100 MG tablet Take 1 tablet (100 mg total) by mouth 2 (two) times daily. (Patient not taking: Reported on 05/12/2019) 10 tablet 0  . ezetimibe (ZETIA) 10 MG tablet Take 1 tablet (10 mg total) by mouth daily. (Patient not taking: Reported on 05/12/2019) 90 tablet 3  . fluticasone (FLONASE) 50 MCG/ACT nasal spray Place 1 spray into both nostrils daily. (Patient not taking: Reported on 05/12/2019) 16 g 5  . hydrALAZINE (APRESOLINE) 10 MG tablet Take 10 mg by mouth 2 (two) times daily.    Marland Kitchen loratadine (CLARITIN) 10 MG tablet Take 1 tablet (10 mg total) by mouth daily. (Patient not taking: Reported on 05/12/2019) 30 tablet 2  . megestrol (MEGACE) 40 MG/ML suspension Take 40 mg by mouth 2 (two) times daily.    . nitroGLYCERIN (NITROSTAT) 0.4 MG SL tablet Place 1 tablet (0.4 mg total) under the tongue every 5 (five) minutes as needed for chest pain. (Patient not taking: Reported on 05/12/2019) 25 tablet 4  . omega-3 acid ethyl esters (LOVAZA) 1 g capsule Take by mouth daily.    . sertraline (ZOLOFT) 50 MG tablet Take 1 tablet  (50 mg total) by mouth at bedtime. (Patient not taking: Reported on 05/12/2019) 90 tablet 3  . sodium chloride 1 g tablet Take 1 g by mouth daily.     No current facility-administered medications on file prior to visit.     There are no Patient Instructions on file for this visit. No follow-ups on file.   Kris Hartmann, NP  This note was completed with Sales executive.  Any errors are purely unintentional.

## 2019-06-21 ENCOUNTER — Other Ambulatory Visit: Payer: Self-pay | Admitting: Family

## 2019-06-21 DIAGNOSIS — E785 Hyperlipidemia, unspecified: Secondary | ICD-10-CM

## 2019-06-22 ENCOUNTER — Telehealth: Payer: Self-pay

## 2019-06-22 NOTE — Telephone Encounter (Signed)
Per Riki Altes message: "Patient has decided she would like to have the biopsy you recommended back in September.  Do you want to see her so we can "restart" this process?"    Dr. Tasia Catchings agrees to see pt. Will notify scheduling.

## 2019-06-22 NOTE — Telephone Encounter (Signed)
Done.... Pt has been scheduled asrequested. Per pt request to be seen on 06/24/19 @ 845.Marland Kitchen

## 2019-06-23 NOTE — Progress Notes (Signed)
Patient is coming in for follow up she is doing well.  

## 2019-06-24 ENCOUNTER — Inpatient Hospital Stay: Payer: Medicare Other

## 2019-06-24 ENCOUNTER — Telehealth: Payer: Self-pay | Admitting: Cardiovascular Disease

## 2019-06-24 ENCOUNTER — Other Ambulatory Visit: Payer: Self-pay

## 2019-06-24 ENCOUNTER — Encounter: Payer: Self-pay | Admitting: Oncology

## 2019-06-24 ENCOUNTER — Inpatient Hospital Stay: Payer: Medicare Other | Attending: Oncology | Admitting: Oncology

## 2019-06-24 ENCOUNTER — Telehealth: Payer: Self-pay

## 2019-06-24 VITALS — BP 186/73 | HR 74 | Temp 95.0°F | Resp 18 | Wt 81.3 lb

## 2019-06-24 DIAGNOSIS — I10 Essential (primary) hypertension: Secondary | ICD-10-CM | POA: Diagnosis not present

## 2019-06-24 DIAGNOSIS — I252 Old myocardial infarction: Secondary | ICD-10-CM | POA: Insufficient documentation

## 2019-06-24 DIAGNOSIS — Z8249 Family history of ischemic heart disease and other diseases of the circulatory system: Secondary | ICD-10-CM | POA: Insufficient documentation

## 2019-06-24 DIAGNOSIS — Z823 Family history of stroke: Secondary | ICD-10-CM | POA: Insufficient documentation

## 2019-06-24 DIAGNOSIS — Z7902 Long term (current) use of antithrombotics/antiplatelets: Secondary | ICD-10-CM | POA: Diagnosis not present

## 2019-06-24 DIAGNOSIS — Z90710 Acquired absence of both cervix and uterus: Secondary | ICD-10-CM | POA: Diagnosis not present

## 2019-06-24 DIAGNOSIS — R109 Unspecified abdominal pain: Secondary | ICD-10-CM | POA: Diagnosis not present

## 2019-06-24 DIAGNOSIS — Z853 Personal history of malignant neoplasm of breast: Secondary | ICD-10-CM

## 2019-06-24 DIAGNOSIS — R948 Abnormal results of function studies of other organs and systems: Secondary | ICD-10-CM | POA: Diagnosis not present

## 2019-06-24 DIAGNOSIS — C773 Secondary and unspecified malignant neoplasm of axilla and upper limb lymph nodes: Secondary | ICD-10-CM | POA: Diagnosis present

## 2019-06-24 DIAGNOSIS — E119 Type 2 diabetes mellitus without complications: Secondary | ICD-10-CM | POA: Insufficient documentation

## 2019-06-24 DIAGNOSIS — Z794 Long term (current) use of insulin: Secondary | ICD-10-CM | POA: Diagnosis not present

## 2019-06-24 DIAGNOSIS — J90 Pleural effusion, not elsewhere classified: Secondary | ICD-10-CM | POA: Diagnosis not present

## 2019-06-24 DIAGNOSIS — Z7982 Long term (current) use of aspirin: Secondary | ICD-10-CM | POA: Diagnosis not present

## 2019-06-24 DIAGNOSIS — R918 Other nonspecific abnormal finding of lung field: Secondary | ICD-10-CM | POA: Diagnosis present

## 2019-06-24 DIAGNOSIS — D649 Anemia, unspecified: Secondary | ICD-10-CM | POA: Diagnosis not present

## 2019-06-24 DIAGNOSIS — Z833 Family history of diabetes mellitus: Secondary | ICD-10-CM | POA: Diagnosis not present

## 2019-06-24 DIAGNOSIS — N183 Chronic kidney disease, stage 3 unspecified: Secondary | ICD-10-CM | POA: Insufficient documentation

## 2019-06-24 DIAGNOSIS — R591 Generalized enlarged lymph nodes: Secondary | ICD-10-CM | POA: Insufficient documentation

## 2019-06-24 DIAGNOSIS — Z9012 Acquired absence of left breast and nipple: Secondary | ICD-10-CM | POA: Diagnosis not present

## 2019-06-24 DIAGNOSIS — E785 Hyperlipidemia, unspecified: Secondary | ICD-10-CM | POA: Insufficient documentation

## 2019-06-24 DIAGNOSIS — Z803 Family history of malignant neoplasm of breast: Secondary | ICD-10-CM | POA: Insufficient documentation

## 2019-06-24 DIAGNOSIS — Z79899 Other long term (current) drug therapy: Secondary | ICD-10-CM | POA: Diagnosis not present

## 2019-06-24 DIAGNOSIS — Z170 Estrogen receptor positive status [ER+]: Secondary | ICD-10-CM | POA: Insufficient documentation

## 2019-06-24 DIAGNOSIS — Z951 Presence of aortocoronary bypass graft: Secondary | ICD-10-CM | POA: Insufficient documentation

## 2019-06-24 DIAGNOSIS — C50919 Malignant neoplasm of unspecified site of unspecified female breast: Secondary | ICD-10-CM | POA: Diagnosis present

## 2019-06-24 LAB — COMPREHENSIVE METABOLIC PANEL
ALT: 25 U/L (ref 0–44)
AST: 30 U/L (ref 15–41)
Albumin: 4 g/dL (ref 3.5–5.0)
Alkaline Phosphatase: 84 U/L (ref 38–126)
Anion gap: 11 (ref 5–15)
BUN: 18 mg/dL (ref 8–23)
CO2: 22 mmol/L (ref 22–32)
Calcium: 9.5 mg/dL (ref 8.9–10.3)
Chloride: 104 mmol/L (ref 98–111)
Creatinine, Ser: 0.91 mg/dL (ref 0.44–1.00)
GFR calc Af Amer: >60 mL/min (ref >60)
GFR calc non Af Amer: 56 mL/min — ABNORMAL LOW (ref >60)
Glucose, Bld: 77 mg/dL (ref 70–99)
Potassium: 3.6 mmol/L (ref 3.5–5.1)
Sodium: 137 mmol/L (ref 135–145)
Total Bilirubin: 0.6 mg/dL (ref 0.3–1.2)
Total Protein: 7.3 g/dL (ref 6.5–8.1)

## 2019-06-24 LAB — CBC WITH DIFFERENTIAL/PLATELET
Abs Immature Granulocytes: 0.03 10*3/uL (ref 0.00–0.07)
Basophils Absolute: 0.1 10*3/uL (ref 0.0–0.1)
Basophils Relative: 1 %
Eosinophils Absolute: 0.2 10*3/uL (ref 0.0–0.5)
Eosinophils Relative: 2 %
HCT: 34.1 % — ABNORMAL LOW (ref 36.0–46.0)
Hemoglobin: 10.7 g/dL — ABNORMAL LOW (ref 12.0–15.0)
Immature Granulocytes: 0 %
Lymphocytes Relative: 11 %
Lymphs Abs: 1.2 10*3/uL (ref 0.7–4.0)
MCH: 28.9 pg (ref 26.0–34.0)
MCHC: 31.4 g/dL (ref 30.0–36.0)
MCV: 92.2 fL (ref 80.0–100.0)
Monocytes Absolute: 0.6 10*3/uL (ref 0.1–1.0)
Monocytes Relative: 5 %
Neutro Abs: 8.5 10*3/uL — ABNORMAL HIGH (ref 1.7–7.7)
Neutrophils Relative %: 81 %
Platelets: 358 10*3/uL (ref 150–400)
RBC: 3.7 MIL/uL — ABNORMAL LOW (ref 3.87–5.11)
RDW: 14.6 % (ref 11.5–15.5)
WBC: 10.5 10*3/uL (ref 4.0–10.5)
nRBC: 0 % (ref 0.0–0.2)

## 2019-06-24 LAB — PROTIME-INR
INR: 1 (ref 0.8–1.2)
Prothrombin Time: 12.6 seconds (ref 11.4–15.2)

## 2019-06-24 LAB — APTT: aPTT: 27 seconds (ref 24–36)

## 2019-06-24 LAB — LACTATE DEHYDROGENASE: LDH: 188 U/L (ref 98–192)

## 2019-06-24 NOTE — Telephone Encounter (Signed)
Pt will need US biopsy of lymph nodes. Request has been sent to Central scheduling. Pt will need MD 1 week after biopsy. Waiting on appt info.

## 2019-06-24 NOTE — Telephone Encounter (Signed)
° °  [facility] Pre-operative Risk Assessment    Request for surgical clearance:  1. What type of surgery is being performed? U/S guided lympnode biopsy    2. When is this surgery scheduled? TBD   3. What type of clearance is required (medical clearance vs. Pharmacy clearance to hold med vs. Both)? Pharmacy   4. Are there any medications that need to be held prior to surgery and how long? Plavix 5 days prior to procedure    5. Practice name and name of physician performing surgery? [facility], Dr Tasia Catchings  6. What is your office phone number [phone]   7.   What is your office fax number [phone]  8.   Anesthesia type (None, local, MAC, general) ? Local    Ace Gins 06/24/2019, 1:39 PM  _________________________________________________________________   (provider comments below)

## 2019-06-24 NOTE — Telephone Encounter (Signed)
   Primary Cardiologist: Ida Rogue, MD  Chart reviewed as part of pre-operative protocol coverage. Given prior recommendations by Dr. Rockey Situ and no interval change in medical history, patient can hold plavix 5 days prior to her upcoming procedure. Plavix should be restarted when cleared to do so by Dr. Tasia Catchings.  I will route this recommendation to the requesting party via [facility] fax function and remove from pre-op pool.  Please call with questions.  Abigail Butts, PA-C 06/24/2019, 3:28 PM

## 2019-06-24 NOTE — Telephone Encounter (Signed)
Anticoagulation clearance requested by central scheduling prior to performing biopsy. I spoke to [place] at Dr. Donivan Scull office for clearance to hold plavix 5 days prior to biopsy. Awaiting on response.   Caryl Pina [phone]

## 2019-06-24 NOTE — Progress Notes (Signed)
Hematology/Oncology  Follow up note [facility] [phone] [phone]   Patient Care Team: [name], FNP as PCP - General (Family Medicine) Rockey Situ [name], MD as PCP - Cardiology (Cardiology)  REFERRING PROVIDER: [name], FNP  CHIEF COMPLAINTS/REASON FOR VISIT:  Follow up for abnormal CT/PET  HISTORY OF PRESENTING ILLNESS:   Tiffany Bailey is a  86 y.o.  female with PMH listed below was seen in consultation at the request of  [name], FNP  for evaluation of abnormal CT scan. Patient was recently evaluated by primary care provider for complaints of urinary retention, headache, constipation. 02/03/2019 x-ray of abdomen showed large colonic stool volume, nodular densities noted in the right mid lung are up). 02/08/2019 subsequent chest x-ray two-view showed hyperinflation of the lungs compatible with COPD.  Multiple nodular densities project over the mid and lower right lung. CT chest was obtained for further evaluation. 02/17/2019 CT chest without contrast Showed multiple bilateral pulmonary nodules,groundglass opacities. Largest solid nodule at the confluence of the right major and minor fissures measuring 1.6 cm.  Most notable groundglass nodule is of the right upper lobe and measure 1.8 x 0.8 cm, small right pleural effusion with associated atelectasis or consolidation.  Larger solid nodule are highly suspicious for metastatic disease. Bulky right mediastinal and hilar lymph node.  Largest pretracheal nodes measuring 3.5 x 3 cm.  There are additional bulky superior mediastinal, supraclavicular, left subpectoral lymph nodes.  Largest subpectoral nodes measuring 3.3 x 2.5 cm.  Patient was sent to cancer center for further evaluation.  #Patient reports a history of left breast cancer status post mastectomy.  She is a poor historian.  She is not able to recall the timeframe of breast cancer diagnosis and then  treatments.  Denies any chemotherapy or radiation treatments.  #Left lower extremity history of amputation, CKD, History of MI, Sickle cell disease, DM, HTN, anemia.   Patient denies shortness of breath, chest pain, hemoptysis.  Night sweating, abdominal pain. Reports intentional weight loss.  Appetite is poor.  Not eating much. Also have wax and wane, chronic intermittent headache, she takes Tylenol as needed, with some relief.  Denies any nausea vomiting. She sees neurology. 11/10/2018 CT head showed no acute intracranial abnormality or significant interval changes.  Stable atrophy and white matter disease.  Atherosclerosis.   INTERVAL HISTORY Tiffany Bailey is a 86 y.o. female who has above history reviewed by me today presents for follow up visit for management of abnormal CT and PET scan Problems and complaints are listed below: Patient had a consultation visit with me on 02/19/2019.  At that time he was referred for abnormal CT scan.  PET scan was obtained and I recommend patient to obtain left axillary lymph node biopsy.  Patient/family called back and decided not to proceed with any additional work-up. Patient had called the cancer center and requests a follow-up for further discussion as patient has changed her mind now. Patient reports that she starts to experience intermittent left anterior chest wall/axillary sharp pain, 10 out of 10, spontaneously resolves, non-radiating.  Patient has decreased appetite and has lost weight as well.  Also increased fatigue.  Today she denies any pain. She takes Tylenol as needed for headache and pain at the left amputation stump.  Review of Systems  Constitutional: Positive for appetite change, fatigue and unexpected weight change. Negative for chills and fever.  HENT:   Negative for hearing loss and voice change.  Eyes: Negative for eye problems.  Respiratory: Negative for chest tightness, cough and shortness of breath.   Cardiovascular: Negative  for chest pain.       Intermittent left anterior chest wall/axillary sharp pain  Gastrointestinal: Negative for abdominal distention, abdominal pain and blood in stool.  Endocrine: Negative for hot flashes.  Genitourinary: Negative for difficulty urinating and frequency.   Musculoskeletal: Negative for arthralgias.       Left lower extremity history of amputation  Skin: Negative for itching and rash.  Neurological: Positive for headaches. Negative for extremity weakness.  Hematological: Negative for adenopathy.  Psychiatric/Behavioral: Negative for confusion.    MEDICAL HISTORY:  Past Medical History:  Diagnosis Date  . Anemia   . Anginal pain ([place])   . Arthritis   . Cancer (HCC)    BREAST  . CKD (chronic kidney disease), stage III   . Coronary artery disease    S/P CABG and multiple PCI's  . Diabetes mellitus without complication ([place])   . Edema   . Failure to thrive in adult   . GERD (gastroesophageal reflux disease)   . Hb-SS disease without crisis ([place]) 10/23/2017  . History of breast cancer   . HOH (hard of hearing)   . Hyperlipidemia   . Hypertension   . Myocardial infarction ([place])   . Palpitations     SURGICAL HISTORY: Past Surgical History:  Procedure Laterality Date  . ABDOMINAL HYSTERECTOMY    . ABOVE KNEE LEG AMPUTATION Left 2013  . ANTERIOR VITRECTOMY Left 11/16/2015   Procedure: ANTERIOR VITRECTOMY;  Surgeon: Eulogio Bear, MD;  Location: [facility] ORS;  Service: Ophthalmology;  Laterality: Left;  . BLADDER SURGERY    . CATARACT EXTRACTION W/PHACO Left 11/16/2015   Procedure: CATARACT EXTRACTION PHACO AND INTRAOCULAR LENS PLACEMENT (IOC);  Surgeon: Eulogio Bear, MD;  Location: [facility];  Service: Ophthalmology;  Laterality: Left;  Lot # [id number] H [place]; 01:22.8 AP%:12.6 CDE: 10.46  . CATARACT EXTRACTION W/PHACO Right 08/22/2016   Procedure: CATARACT EXTRACTION PHACO AND INTRAOCULAR LENS PLACEMENT (IOC);  Surgeon: Eulogio Bear, MD;  Location: [facility] ORS;   Service: Ophthalmology;  Laterality: Right;  Lot # [id number] H [place]: 00:52.1 AP%:8.7 CDE: 4.99   . CORONARY ANGIOPLASTY     STENT  . CORONARY ARTERY BYPASS GRAFT    . LOWER EXTREMITY ANGIOGRAPHY Right 01/14/2019   Procedure: LOWER EXTREMITY ANGIOGRAPHY;  Surgeon: Algernon Huxley, MD;  Location: [facility];  Service: Cardiovascular;  Laterality: Right;  . MASTECTOMY    . TUMOR REMOVAL     ABDOMINAL    SOCIAL HISTORY: Social History   Socioeconomic History  . Marital status: Divorced    Spouse name: Not on file  . Number of children: 7  . Years of education: Not on file  . Highest education level: Not on file  Occupational History  . 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 Smoking status: Never Smoker  . Smokeless tobacco: Never Used  Substance and Sexual Activity  . Alcohol use: No  . Drug use: No  . Sexual activity: Not Currently  Other Topics Concern  . Not on file  Social History Narrative  . Not on file   Social Determinants of Health   Financial Resource Strain: Low Risk   . Difficulty of Paying Living Expenses: Not hard at all  Food Insecurity: No Food Insecurity  . Worried About Charity fundraiser in the Last Year: Never true  .  Ran Out of Food in the Last Year: Never true  Transportation Needs: No Transportation Needs  . Lack of Transportation (Medical): No  . Lack of Transportation (Non-Medical): No  Physical Activity: Insufficiently Active  . Days of Exercise per Week: 7 days  . Minutes of Exercise per Session: 20 min  Stress: No Stress Concern Present  . Feeling of Stress : Only a little  Social Connections: Unknown  . Frequency of Communication with Friends and Family: More than three times a week  . Frequency of Social Gatherings with Friends and Family: Not on file  . Attends Religious Services: Not on file  . Active Member of Clubs or Organizations: Not on file  . Attends Archivist Meetings: Not on file  .  Marital Status: Not on file  Intimate Partner Violence: Not At Risk  . Fear of Current or Ex-Partner: No  . Emotionally Abused: No  . Physically Abused: No  . Sexually Abused: No    FAMILY HISTORY: Family History  Problem Relation Age of Onset  . Sudden death Mother   . Arthritis Father   . Stroke Father   . Breast cancer Sister   . Diabetes Grandchild     ALLERGIES:  is allergic to amoxicillin; gabapentin; mirtazapine; and other.  MEDICATIONS:  Current Outpatient Medications  Medication Sig Dispense Refill  . acetaminophen (TYLENOL) 500 MG tablet Take 1 tablet (500 mg total) by mouth every 6 (six) hours as needed. 30 tablet 0  . amLODipine (NORVASC) 10 MG tablet Take 1 tablet (10 mg total) by mouth daily. 90 tablet 3  . aspirin EC 81 MG tablet Take 1 tablet (81 mg total) by mouth daily. 30 tablet 5  . atorvastatin (LIPITOR) 40 MG tablet TAKE 1 TABLET BY MOUTH  DAILY 90 tablet 3  . blood glucose meter kit and supplies KIT Dispense based on patient and insurance preference. Use up to four times daily as directed. (FOR ICD-9 250.00, 250.01). 1 each 0  . clopidogrel (PLAVIX) 75 MG tablet Take 1 tablet (75 mg total) by mouth daily. 90 tablet 3  . cyclobenzaprine (FLEXERIL) 5 MG tablet Take 1 tablet (5 mg total) by mouth at bedtime as needed for muscle spasms. 30 tablet 0  . ezetimibe (ZETIA) 10 MG tablet Take 1 tablet (10 mg total) by mouth daily. 90 tablet 3  . glucose blood (ONE TOUCH TEST STRIPS) test strip Use to test blood sugar twice daily 200 each 3  . hydrALAZINE (APRESOLINE) 10 MG tablet Take 10 mg by mouth 2 (two) times daily.    . Insulin Glargine (BASAGLAR KWIKPEN) 100 UNIT/ML SOPN Inject 0.06 mLs (6 Units total) into the skin daily. [place] [facility] as of  01/21/2019    . Insulin Pen Needle (BD PEN NEEDLE NANO U/F) 32G X 4 MM MISC USE 1 DAILY 100 each 12  . metFORMIN (GLUCOPHAGE) 500 MG tablet Take by mouth.    . metoprolol tartrate (LOPRESSOR) 25 MG tablet TAKE 1 TABLET BY  MOUTH TWO  TIMES DAILY 180 tablet 3  . nitroGLYCERIN (NITROSTAT) 0.4 MG SL tablet Place 1 tablet (0.4 mg total) under the tongue every 5 (five) minutes as needed for chest pain. 25 tablet 4  . ONE TOUCH LANCETS MISC Use as directed 2 times per day 200 each 3  . doxycycline (VIBRA-TABS) 100 MG tablet Take 1 tablet (100 mg total) by mouth 2 (two) times daily. (Patient not taking: Reported on 06/24/2019) 10 tablet 0  . fluticasone (FLONASE)  50 MCG/ACT nasal spray Place 1 spray into both nostrils daily. (Patient not taking: Reported on 06/24/2019) 16 g 5  . HYDROcodone-acetaminophen (NORCO/VICODIN) 5-325 MG tablet Take 1 tablet by mouth every 6 (six) hours as needed for moderate pain ([place]). (Patient not taking: Reported on 06/24/2019) 30 tablet 0  . loratadine (CLARITIN) 10 MG tablet Take 1 tablet (10 mg total) by mouth daily. (Patient not taking: Reported on 06/24/2019) 30 tablet 2  . megestrol (MEGACE) 40 MG/ML suspension Take 40 mg by mouth 2 (two) times daily.    Marland Kitchen omega-3 acid ethyl esters (LOVAZA) 1 g capsule Take by mouth daily.    . sertraline (ZOLOFT) 50 MG tablet Take 1 tablet (50 mg total) by mouth at bedtime. (Patient not taking: Reported on 06/24/2019) 90 tablet 3  . sodium chloride 1 g tablet Take 1 g by mouth daily.     No current facility-administered medications for this visit.     PHYSICAL EXAMINATION: ECOG PERFORMANCE STATUS: 2 - Symptomatic, <50% confined to bed Vitals:   06/24/19 0904  BP: (!) 186/73  Pulse: 74  Resp: 18  Temp: (!) 95 F (35 C)  SpO2: 98%   Filed Weights   06/24/19 0904  Weight: 81 lb 4.8 oz (36.9 kg)    Physical Exam Constitutional:      General: She is not in acute distress.    Comments: She sits in the wheel chair.  Thin  built  HENT:     Head: Normocephalic and atraumatic.  Eyes:     General: No scleral icterus.    Pupils: Pupils are equal, round, and reactive to light.  Cardiovascular:     Rate and Rhythm: Normal rate and regular  rhythm.     Heart sounds: Normal heart sounds.  Pulmonary:     Effort: Pulmonary effort is normal. No respiratory distress.     Breath sounds: No wheezing.     Comments: Severely diminished breath sounds bilaterally.   Abdominal:     General: Bowel sounds are normal. There is no distension.     Palpations: Abdomen is soft. There is no mass.     Tenderness: There is no abdominal tenderness.  Musculoskeletal:        General: No deformity. Normal range of motion.     Cervical back: Normal range of motion and neck supple.     Comments: Left lower extremity history of amputation.   Skin:    General: Skin is warm and dry.     Findings: No erythema or rash.  Neurological:     Mental Status: She is alert and oriented to person, place, and time.     Cranial Nerves: No cranial nerve deficit.     Coordination: Coordination normal.  Psychiatric:        Behavior: Behavior normal.        Thought Content: Thought content normal.   Breast exam is performed in seated position. Patient is status post left mastectomy. No palpable chest wall mass. No palpable right breast mass.    LABORATORY DATA:  I have reviewed the data as listed Lab Results  Component Value Date   WBC 10.5 06/24/2019   HGB 10.7 (L) 06/24/2019   HCT 34.1 (L) 06/24/2019   MCV 92.2 06/24/2019   PLT 358 06/24/2019   Recent Labs    01/25/19 1425 02/19/19 1145 02/26/19 0828 06/24/19 0942  NA 135 132* 140 137  K 4.7 4.3 4.0 3.6  CL 101 102 106 104  CO2 25 22 25 22   GLUCOSE 194* 285* 55* 77  BUN 17 17 23 18   CREATININE 1.06* 1.07* 1.06 0.91  CALCIUM 9.3 9.0 9.6 9.5  GFRNONAA 47* 46*  --  56*  GFRAA 54* 54*  --  >60  PROT 6.8 6.4* 6.1 7.3  ALBUMIN 3.5 3.6 3.8 4.0  AST 24 19 17 30   ALT 22 15 10 25   ALKPHOS 88 79 74 84  BILITOT 0.6 0.5 0.5 0.6   Iron/TIBC/Ferritin/ %Sat    Component Value Date/Time   IRON 52 06/19/2018 1006   TIBC 195 (L) 06/19/2018 1006   FERRITIN 104 06/19/2018 1006   IRONPCTSAT 27  06/19/2018 1006      RADIOGRAPHIC STUDIES: I have personally reviewed the radiological images as listed and agreed with the findings in the report.  No results found.    ASSESSMENT & PLAN:  1. Lymphadenopathy   2. History of breast cancer   3. Normocytic anemia   4. Abnormal positron emission tomography (PET) scan    #Abnormal PET/CT scan/lymphadenopathy CT and PET scan images were independently viewed by me and discussed with patient. Clinically she has metastatic cancer.  History of left breast cancer status post mastectomy.  Pathology of previous breast cancer is not available to me. I recommend obtain left axillary lymph node biopsy to establish tissue diagnosis. Her previous lab work showed normal white count and differential.  Normal LDH, normal uric acid peripheral blood flow cytometry was negative.  Less likely lymphoma. Normocytic anemia, hemoglobin 10.0 continue monitor.  I discussed with patient that she may need additional images given that current PET scan has been a 7-monthold. Further management plan pending tissue diagnosis. Check CBC, CMP, INR, PTT. Patient will follow up virtually to discuss biopsy results and management plan. Orders Placed This Encounter  Procedures  . CBC with Differential    Standing Status:   Future    Number of Occurrences:   1    Standing Expiration Date:   06/23/2020  . Comprehensive metabolic panel    Standing Status:   Future    Number of Occurrences:   1    Standing Expiration Date:   06/23/2020  . Lactate dehydrogenase    Standing Status:   Future    Number of Occurrences:   1    Standing Expiration Date:   06/23/2020  . Protime-INR    Standing Status:   Future    Number of Occurrences:   1    Standing Expiration Date:   06/23/2020  . APTT    Standing Status:   Future    Number of Occurrences:   1    Standing Expiration Date:   06/23/2020    All questions were answered. The patient knows to call the clinic with any problems  questions or concerns.  cc ABurnard Hawthorne FNP    Return of visit: 1 week after biopsy.  ZEarlie Server MD, PhD Hematology Oncology CBapt[facility] Hospitalat AMental Health InstitutePager- [phone]/12/2019

## 2019-06-24 NOTE — Progress Notes (Signed)
Patient here for follow up. Pt concerned with "whats going on with my chest."

## 2019-06-25 NOTE — Telephone Encounter (Signed)
Anticoagulation clearance in Epic. Ok for pt to stop plavix 5 days prior to procedure. Per message from Eudora bates  " I have patient Tiffany Bailey scheduled on Tues 07/06/19 at 10:30a and to arrive at 9:30a.  Her last day of taking Plavix is Wed 06/30/19.  The nurse will call her and go over her instructions a couple of days prior to procedure." Patient to resume plavix the day after biopsy per Dr. Tasia Catchings.   Pt has been scheduled to see Dr. Tasia Catchings on 1/26. I have called and talked to patient's daughter , Tiffany Bailey, and notified her of appts and given instructions on plavix. Pt's daughter voiced understanding.

## 2019-07-02 ENCOUNTER — Other Ambulatory Visit: Payer: Self-pay

## 2019-07-02 ENCOUNTER — Ambulatory Visit (INDEPENDENT_AMBULATORY_CARE_PROVIDER_SITE_OTHER): Payer: Medicare Other | Admitting: Physician Assistant

## 2019-07-02 ENCOUNTER — Encounter: Payer: Self-pay | Admitting: Physician Assistant

## 2019-07-02 VITALS — BP 130/50 | HR 71 | Ht 60.0 in | Wt 81.2 lb

## 2019-07-02 DIAGNOSIS — I739 Peripheral vascular disease, unspecified: Secondary | ICD-10-CM

## 2019-07-02 DIAGNOSIS — E1122 Type 2 diabetes mellitus with diabetic chronic kidney disease: Secondary | ICD-10-CM | POA: Diagnosis not present

## 2019-07-02 DIAGNOSIS — I10 Essential (primary) hypertension: Secondary | ICD-10-CM

## 2019-07-02 DIAGNOSIS — M1990 Unspecified osteoarthritis, unspecified site: Secondary | ICD-10-CM

## 2019-07-02 DIAGNOSIS — R918 Other nonspecific abnormal finding of lung field: Secondary | ICD-10-CM

## 2019-07-02 DIAGNOSIS — I25118 Atherosclerotic heart disease of native coronary artery with other forms of angina pectoris: Secondary | ICD-10-CM

## 2019-07-02 DIAGNOSIS — Z89612 Acquired absence of left leg above knee: Secondary | ICD-10-CM

## 2019-07-02 DIAGNOSIS — Z794 Long term (current) use of insulin: Secondary | ICD-10-CM

## 2019-07-02 DIAGNOSIS — N183 Chronic kidney disease, stage 3 unspecified: Secondary | ICD-10-CM

## 2019-07-02 DIAGNOSIS — E1159 Type 2 diabetes mellitus with other circulatory complications: Secondary | ICD-10-CM

## 2019-07-02 DIAGNOSIS — K219 Gastro-esophageal reflux disease without esophagitis: Secondary | ICD-10-CM
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
## 2019-07-02 NOTE — Progress Notes (Signed)
Patient called with instructions given for LN biopsy 07/06/2019, aware to stay off plavix since 1/13, NPO after MN prior to procedure as well as ride home. Holding Metformin morning of procedure. Stated understanding.

## 2019-07-02 NOTE — Patient Instructions (Signed)
Medication Instructions:  No medication changes. *If you need a refill on your cardiac medications before your next appointment, please call your pharmacy*  Lab Work: None ordered.  If you have labs (blood work) drawn today and your tests are completely normal, you will receive your results only by: Marland Kitchen MyChart Message (if you have MyChart) OR . A paper copy in the mail If you have any lab test that is abnormal or we need to change your treatment, we will call you to review the results.  Testing/Procedures: None ordered.  Follow-Up: At [facility], you and your health needs are our priority.  As part of our continuing mission to provide you with exceptional heart care, we have created designated Provider Care Teams.  These Care Teams include your primary Cardiologist (physician) and Advanced Practice Providers (APPs -  Physician Assistants and Nurse Practitioners) who all work together to provide you with the care you need, when you need it.  Your next appointment:   6 month(s)  The format for your next appointment:   In Person  Provider:    You may see Ida Rogue, MD or one of the following Advanced Practice Providers on your designated Care Team:    Murray Hodgkins, NP  Christell Faith, PA-C  Marrianne Mood, PA-C   Other Instructions NA

## 2019-07-05 ENCOUNTER — Other Ambulatory Visit: Payer: Self-pay | Admitting: Student

## 2019-07-06 ENCOUNTER — Ambulatory Visit
Admission: RE | Admit: 2019-07-06 | Discharge: 2019-07-06 | Disposition: A | Discharge status: Home / Self Care | Payer: Medicare Other | Source: Ambulatory Visit | Attending: Oncology | Admitting: Oncology

## 2019-07-06 ENCOUNTER — Other Ambulatory Visit: Payer: Self-pay

## 2019-07-06 DIAGNOSIS — R591 Generalized enlarged lymph nodes: Secondary | ICD-10-CM

## 2019-07-06 DIAGNOSIS — I252 Old myocardial infarction: Secondary | ICD-10-CM | POA: Diagnosis not present

## 2019-07-06 DIAGNOSIS — Z853 Personal history of malignant neoplasm of breast: Secondary | ICD-10-CM | POA: Insufficient documentation

## 2019-07-06 DIAGNOSIS — E1122 Type 2 diabetes mellitus with diabetic chronic kidney disease: Secondary | ICD-10-CM | POA: Diagnosis not present

## 2019-07-06 DIAGNOSIS — N183 Chronic kidney disease, stage 3 unspecified: Secondary | ICD-10-CM | POA: Insufficient documentation

## 2019-07-06 DIAGNOSIS — I129 Hypertensive chronic kidney disease with stage 1 through stage 4 chronic kidney disease, or unspecified chronic kidney disease: Secondary | ICD-10-CM | POA: Diagnosis not present

## 2019-07-06 LAB — GLUCOSE, CAPILLARY
Glucose-Capillary: 51 mg/dL — ABNORMAL LOW (ref 70–99)
Glucose-Capillary: 54 mg/dL — ABNORMAL LOW (ref 70–99)
Glucose-Capillary: 62 mg/dL — ABNORMAL LOW (ref 70–99)
Glucose-Capillary: 75 mg/dL (ref 70–99)

## 2019-07-06 MED ORDER — HYDROCODONE-ACETAMINOPHEN 5-325 MG PO TABS: 1.0000 | ORAL_TABLET | ORAL | Status: DC | PRN

## 2019-07-06 MED ORDER — SODIUM CHLORIDE 0.9 % IV SOLN: INTRAVENOUS | Status: DC

## 2019-07-06 NOTE — Procedures (Signed)
[place] bx of left subpectoral nodes without difficulty  Complications:  None  Blood Loss: none  See dictation in canopy pacs

## 2019-07-06 NOTE — Discharge Instructions (Signed)

## 2019-07-12 ENCOUNTER — Other Ambulatory Visit: Payer: Self-pay

## 2019-07-12 NOTE — Progress Notes (Signed)
Patient pre screened for office appointment, no questions or concerns today. Patient reminded of upcoming appointment time and date. 

## 2019-07-13 ENCOUNTER — Inpatient Hospital Stay (HOSPITAL_BASED_OUTPATIENT_CLINIC_OR_DEPARTMENT_OTHER): Payer: Medicare Other | Admitting: Oncology

## 2019-07-13 ENCOUNTER — Other Ambulatory Visit: Payer: Self-pay

## 2019-07-13 ENCOUNTER — Encounter: Payer: Self-pay | Admitting: Oncology

## 2019-07-13 VITALS — BP 149/56 | HR 70 | Temp 96.4°F | Resp 18 | Wt 83.2 lb

## 2019-07-13 DIAGNOSIS — C50919 Malignant neoplasm of unspecified site of unspecified female breast: Secondary | ICD-10-CM | POA: Diagnosis not present

## 2019-07-13 DIAGNOSIS — Z7189 Other specified counseling: Secondary | ICD-10-CM | POA: Diagnosis not present

## 2019-07-13 DIAGNOSIS — D649 Anemia, unspecified: Secondary | ICD-10-CM

## 2019-07-13 NOTE — Progress Notes (Addendum)
Hematology/Oncology  Follow up note [facility] Telephone:([phone]) [phone] Fax:([phone]) [phone]   Patient Care Team: [name], FNP as PCP - General (Family Medicine) Rockey Situ [name], MD as PCP - Cardiology (Cardiology)  REFERRING PROVIDER: [name], FNP  CHIEF COMPLAINTS/REASON FOR VISIT:  Follow up for abnormal CT/PET  HISTORY OF PRESENTING ILLNESS:   Tiffany Bailey is a  86 y.o.  female with PMH listed below was seen in consultation at the request of  [name], FNP  for evaluation of abnormal CT scan. Patient was recently evaluated by primary care provider for complaints of urinary retention, headache, constipation. 02/03/2019 x-ray of abdomen showed large colonic stool volume, nodular densities noted in the right mid lung are up). 02/08/2019 subsequent chest x-ray two-view showed hyperinflation of the lungs compatible with COPD.  Multiple nodular densities project over the mid and lower right lung. CT chest was obtained for further evaluation. 02/17/2019 CT chest without contrast Showed multiple bilateral pulmonary nodules,groundglass opacities. Largest solid nodule at the confluence of the right major and minor fissures measuring 1.6 cm.  Most notable groundglass nodule is of the right upper lobe and measure 1.8 x 0.8 cm, small right pleural effusion with associated atelectasis or consolidation.  Larger solid nodule are highly suspicious for metastatic disease. Bulky right mediastinal and hilar lymph node.  Largest pretracheal nodes measuring 3.5 x 3 cm.  There are additional bulky superior mediastinal, supraclavicular, left subpectoral lymph nodes.  Largest subpectoral nodes measuring 3.3 x 2.5 cm.  Patient was sent to cancer center for further evaluation.  #Patient reports a history of left breast cancer status post mastectomy.  She is a poor historian.  She is not able to recall the timeframe of breast cancer diagnosis and then  treatments.  Denies any chemotherapy or radiation treatments.  #Left lower extremity history of amputation, CKD, History of MI, Sickle cell disease, DM, HTN, anemia.   Patient denies shortness of breath, chest pain, hemoptysis.  Night sweating, abdominal pain. Reports intentional weight loss.  Appetite is poor.  Not eating much. Also have wax and wane, chronic intermittent headache, she takes Tylenol as needed, with some relief.  Denies any nausea vomiting. She sees neurology. 11/10/2018 CT head showed no acute intracranial abnormality or significant interval changes.  Stable atrophy and white matter disease.  Atherosclerosis.  #Patient had a consultation visit with me on 02/19/2019.  At that time he was referred for abnormal CT scan.  PET scan was obtained and I recommend patient to obtain left axillary lymph node biopsy.  Patient/family called back and decided not to proceed with any additional work-up.  INTERVAL HISTORY Tiffany Bailey is a 86 y.o. female who has above history reviewed by me today presents for follow up visit for management of metastatic breast cancer. Problems and complaints are listed below: During the interval, patient has had underwent left axillary lymph node ultrasound-guided biopsy. Pathology showed adenocarcinoma with mucinous features.  Immunohistochemistry staining showed positive Gata 3 and a negative for TTF-1 and CDX2.  The findings support a diagnosis of metastatic adenocarcinoma of breast origin. Patient present to discuss results.  She continues to have intermittent right chest wall pain episodes.  Spontaneously resolved.  10 out of 10.  Nonradiating.   Review of Systems  Constitutional: Positive for appetite change, fatigue and unexpected weight change. Negative for chills and fever.  HENT:   Negative for hearing loss and voice change.   Eyes: Negative for eye problems.  Respiratory: Negative for chest tightness, cough and shortness of breath.   Cardiovascular:  Negative for chest pain.       Intermittent left anterior chest wall/axillary sharp pain  Gastrointestinal: Negative for abdominal distention, abdominal pain and blood in stool.  Endocrine: Negative for hot flashes.  Genitourinary: Negative for difficulty urinating and frequency.   Musculoskeletal: Negative for arthralgias.       Left lower extremity history of amputation  Skin: Negative for itching and rash.  Neurological: Positive for headaches. Negative for extremity weakness.  Hematological: Negative for adenopathy.  Psychiatric/Behavioral: Negative for confusion.    MEDICAL HISTORY:  Past Medical History:  Diagnosis Date   Anemia    Anginal pain ([place])    Arthritis    Cancer ([place])    BREAST   CKD (chronic kidney disease), stage III    Coronary artery disease    S/P CABG and multiple PCI's   Diabetes mellitus without complication (HCC)    Edema    Failure to thrive in adult    GERD (gastroesophageal reflux disease)    Hb-SS disease without crisis ([place]) 10/23/2017   History of breast cancer    HOH (hard of hearing)    Hyperlipidemia    Hypertension    Myocardial infarction [facility])    Palpitations     SURGICAL HISTORY: Past Surgical History:  Procedure Laterality Date   ABDOMINAL HYSTERECTOMY     ABOVE KNEE LEG AMPUTATION Left 2013   ANTERIOR VITRECTOMY Left 11/16/2015   Procedure: ANTERIOR VITRECTOMY;  Surgeon: Eulogio Bear, MD;  Location: [facility] ORS;  Service: Ophthalmology;  Laterality: Left;   BLADDER SURGERY     CATARACT EXTRACTION W/PHACO Left 11/16/2015   Procedure: CATARACT EXTRACTION PHACO AND INTRAOCULAR LENS PLACEMENT (IOC);  Surgeon: Eulogio Bear, MD;  Location: [facility] ORS;  Service: Ophthalmology;  Laterality: Left;  Lot # [id number] H [place]; 01:22.8 AP%:12.6 CDE: 10.46   CATARACT EXTRACTION W/PHACO Right 08/22/2016   Procedure: CATARACT EXTRACTION PHACO AND INTRAOCULAR LENS PLACEMENT (IOC);  Surgeon: Eulogio Bear, MD;  Location: [facility]  ORS;  Service: Ophthalmology;  Laterality: Right;  Lot # [id number] H [place]: 00:52.1 AP%:8.7 CDE: 4.99    CORONARY ANGIOPLASTY     STENT   CORONARY ARTERY BYPASS GRAFT     LOWER EXTREMITY ANGIOGRAPHY Right 01/14/2019   Procedure: LOWER EXTREMITY ANGIOGRAPHY;  Surgeon: Algernon Huxley, MD;  Location: [place] CV LAB;  Service: Cardiovascular;  Laterality: Right;   MASTECTOMY     TUMOR REMOVAL     ABDOMINAL    SOCIAL HISTORY: Social History   Socioeconomic History   Marital status: Divorced    Spouse name: Not on file   Number of children: 7   Years of education: Not on file   Highest education level: Not on file  Occupational History   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  Tobacco Use   Smoking status: Never Smoker   Smokeless tobacco: Never Used  Substance and Sexual Activity   Alcohol use: No   Drug use: No   Sexual activity: Not Currently  Other Topics Concern   Not on file  Social History Narrative   Not on file   Social Determinants of Health   Financial Resource Strain: Low Risk    Difficulty of Paying Living Expenses: Not hard at all  Food Insecurity: No Food Insecurity   Worried About Charity fundraiser in the Last Year: Never true   [place] in the  Last Year: Never true  Transportation Needs: No Transportation Needs   Lack of Transportation (Medical): No   Lack of Transportation (Non-Medical): No  Physical Activity: Insufficiently Active   Days of Exercise per Week: 7 days   Minutes of Exercise per Session: 20 min  Stress: No Stress Concern Present   Feeling of Stress : Only a little  Social Connections: Unknown   Frequency of Communication with Friends and Family: More than three times a week   Frequency of Social Gatherings with Friends and Family: Not on file   Attends Religious Services: Not on Electrical engineer or Organizations: Not on file   Attends Archivist Meetings: Not on file    Marital Status: Not on file  Intimate Partner Violence: Not At Risk   Fear of Current or Ex-Partner: No   Emotionally Abused: No   Physically Abused: No   Sexually Abused: No    FAMILY HISTORY: Family History  Problem Relation Age of Onset   Sudden death Mother    Arthritis Father    Stroke Father    Breast cancer Sister    Diabetes Grandchild     ALLERGIES:  is allergic to amoxicillin; gabapentin; mirtazapine; and other.  MEDICATIONS:  Current Outpatient Medications  Medication Sig Dispense Refill   acetaminophen (TYLENOL) 500 MG tablet Take 1 tablet (500 mg total) by mouth every 6 (six) hours as needed. 30 tablet 0   amLODipine (NORVASC) 10 MG tablet Take 1 tablet (10 mg total) by mouth daily. 90 tablet 3   aspirin EC 81 MG tablet Take 1 tablet (81 mg total) by mouth daily. 30 tablet 5   atorvastatin (LIPITOR) 40 MG tablet TAKE 1 TABLET BY MOUTH  DAILY 90 tablet 3   blood glucose meter kit and supplies KIT Dispense based on patient and insurance preference. Use up to four times daily as directed. (FOR ICD-9 250.00, 250.01). 1 each 0   clopidogrel (PLAVIX) 75 MG tablet Take 1 tablet (75 mg total) by mouth daily. 90 tablet 3   cyclobenzaprine (FLEXERIL) 5 MG tablet Take 1 tablet (5 mg total) by mouth at bedtime as needed for muscle spasms. 30 tablet 0   fluticasone (FLONASE) 50 MCG/ACT nasal spray Place 1 spray into both nostrils daily. 16 g 5   glucose blood (ONE TOUCH TEST STRIPS) test strip Use to test blood sugar twice daily 200 each 3   hydrALAZINE (APRESOLINE) 10 MG tablet Take 10 mg by mouth 2 (two) times daily.     Insulin Glargine (BASAGLAR KWIKPEN) 100 UNIT/ML SOPN Inject 0.06 mLs (6 Units total) into the skin daily. [facility] as of  01/21/2019     Insulin Pen Needle (BD PEN NEEDLE NANO U/F) 32G X 4 MM MISC USE 1 DAILY 100 each 12   metFORMIN (GLUCOPHAGE) 500 MG tablet Take 500 mg by mouth daily with breakfast.      metoprolol tartrate  (LOPRESSOR) 25 MG tablet TAKE 1 TABLET BY MOUTH TWO  TIMES DAILY 180 tablet 3   Multiple Vitamin (MULTIVITAMIN) tablet Take 1 tablet by mouth daily.     nitroGLYCERIN (NITROSTAT) 0.4 MG SL tablet Place 1 tablet (0.4 mg total) under the tongue every 5 (five) minutes as needed for chest pain. 25 tablet 4   ONE TOUCH LANCETS MISC Use as directed 2 times per day 200 each 3   No current facility-administered medications for this visit.     PHYSICAL EXAMINATION: ECOG PERFORMANCE STATUS: 2 -  Symptomatic, <50% confined to bed Vitals:   07/13/19 0949  BP: (!) 149/56  Pulse: 70  Resp: 18  Temp: (!) 96.4 F (35.8 C)   Filed Weights   07/13/19 0949  Weight: 83 lb 3.2 oz (37.7 kg)    Physical Exam Constitutional:      General: She is not in acute distress.    Comments: She sits in the wheel chair.  Thin  built  HENT:     Head: Normocephalic and atraumatic.  Eyes:     General: No scleral icterus.    Pupils: Pupils are equal, round, and reactive to light.  Cardiovascular:     Rate and Rhythm: Normal rate and regular rhythm.     Heart sounds: Normal heart sounds.  Pulmonary:     Effort: Pulmonary effort is normal. No respiratory distress.     Breath sounds: No wheezing.     Comments: Severely diminished breath sounds bilaterally.   Abdominal:     General: Bowel sounds are normal. There is no distension.     Palpations: Abdomen is soft. There is no mass.     Tenderness: There is no abdominal tenderness.  Musculoskeletal:        General: No deformity. Normal range of motion.     Cervical back: Normal range of motion and neck supple.     Comments: Left lower extremity history of amputation.   Skin:    General: Skin is warm and dry.     Findings: No erythema or rash.  Neurological:     Mental Status: She is alert and oriented to person, place, and time.     Cranial Nerves: No cranial nerve deficit.     Coordination: Coordination normal.  Psychiatric:        Behavior: Behavior  normal.        Thought Content: Thought content normal.   Breast exam is performed in seated position. Patient is status post left mastectomy. No palpable chest wall mass. No palpable right breast mass.    LABORATORY DATA:  I have reviewed the data as listed Lab Results  Component Value Date   WBC 10.5 06/24/2019   HGB 10.7 (L) 06/24/2019   HCT 34.1 (L) 06/24/2019   MCV 92.2 06/24/2019   PLT 358 06/24/2019   Recent Labs    01/25/19 1425 01/25/19 1425 02/19/19 1145 02/26/19 0828 06/24/19 0942  NA 135   < > 132* 140 137  K 4.7   < > 4.3 4.0 3.6  CL 101   < > 102 106 104  CO2 25   < > _0 GLUCOSE 194*   < > 285* 55* 77  BUN 17   < > _1 CREATININE 1.06*   < > 1.07* 1.06 0.91  CALCIUM 9.3   < > 9.0 9.6 9.5  GFRNONAA 47*  --  46*  --  56*  GFRAA 54*  --  54*  --  >60  PROT 6.8   < > 6.4* 6.1 7.3  ALBUMIN 3.5   < > 3.6 3.8 4.0  AST 24   < > _2 ALT 22   < > _3 ALKPHOS 88   < > 79 74 84  BILITOT 0.6   < > 0.5 0.5 0.6   < > = values in this interval not displayed.   Iron/TIBC/Ferritin/ %Sat    Component Value Date/Time   IRON 52 06/19/2018 1006  TIBC 195 (L) 06/19/2018 1006   FERRITIN 104 06/19/2018 1006   IRONPCTSAT 27 06/19/2018 1006      RADIOGRAPHIC STUDIES: I have personally reviewed the radiological images as listed and agreed with the findings in the report.  [place] CORE BIOPSY (LYMPH NODES)  Result Date: 07/06/2019 INDICATION: Left axillary adenopathy EXAM: ULTRASOUND GUIDED CORE  BIOPSY OF LEFT SUBPECTORAL LYMPH NODE MEDICATIONS: None. ANESTHESIA/SEDATION: None PROCEDURE: The procedure, risks, benefits, and alternatives were explained to the patient. Questions regarding the procedure were encouraged and answered. The patient understands and consents to the procedure. The left axilla was prepped with chlorhexidine in a sterile fashion, and a sterile drape was applied covering the operative field. A sterile gown and sterile gloves were used  for the procedure. Local anesthesia was provided with 1% Lidocaine. Utilizing real-time ultrasound guidance a 17 gauge guiding needle was placed percutaneously into the left subpectoral lymph node mass. Multiple 18 gauge core biopsies were then obtained without difficulty. These were sent on moistened Telfa for pathologic evaluation. The patient tolerated the procedure well. Needle was removed without difficulty and hemostasis obtained at the puncture site. Puncture site was then dressed in the standard sterile manner. COMPLICATIONS: None immediate. FINDINGS: Two subpectoral lymph node masses are seen similar to that noted on prior PET-CT. IMPRESSION: Successful ultrasound-guided biopsy of left subpectoral lymph node mass. Electronically Signed   By: [name] M.D.   On: 07/06/2019 12:19      ASSESSMENT & PLAN:  1. Metastatic breast cancer ([place])   2. Goals of care, counseling/discussion   3. Normocytic anemia    #Metastatic breast cancer, Pathology was reviewed and discussed with patient.  Adenocarcinoma with breast origin. Patient understands that she has stage IV disease, which is not curable.  Goal of care: Palliative. I discussed with pathology and ER/PR HER-2 status will be obtained. Patient has history of breast cancer, no pathology was available for his previous cancer. I discussed with patient that his treatment plan will be different depending on her ER/PR/HER-2 status. If patient has ER/PR positive, HER-2 negative disease, patient will be started on aromatase inhibitor with Palbociclib or abemaciclib. I recommend repeat PET scan prior to his treatments.  Patient agrees with the plan. Normocytic anemia, will check iron panel, folate, vitamin B12 level.  Orders Placed This Encounter  Procedures   NM PET Image Restag (PS) Skull Base To Thigh    Standing Status:   Future    Standing Expiration Date:   07/12/2020    Order Specific Question:   ** REASON FOR EXAM (FREE TEXT)     Answer:   metastatic breast cancer    Order Specific Question:   If indicated for the ordered procedure, I authorize the administration of a radiopharmaceutical per Radiology protocol    Answer:   Yes    Order Specific Question:   Radiology Contrast Protocol - do NOT remove file path    Answer:   \[email or web address]    All questions were answered. The patient knows to call the clinic with any problems questions or concerns.  cc [name], FNP  Return of visit: After PET scan.Marland Kitchen  Awaiting additional immunohistochemistry staining of breast cancer.  [name], MD, PhD Hematology Oncology [facility] [place]- [phone] 07/13/2019   #Addendum, ER/PR/HER-2 status came back on her adenocarcinoma. ER strongly +90%, PR negative, HER-2 negative. I called patient's daughter [name] and communicated with her about the results.  I recommend systemic antihormonal treatment with  Arimidex 1 mg daily.  Given her age and multiple other medical problems, I use the CDK5/6 inhibitor as a second line if antihormone therapy alone is not able to control her disease. PET scan from September was reviewed again with daughter.  A second primary of the lung cannot be excluded.  Planning to repeat PET scan at this point as her new baseline.  Awaiting for insurance to approve.  Side effects of Arimidex were discussed in details with daughter.  Prescription has been sent to patient's [place].  Confirmed with daughter who will pick it up for patient and patient will start.  Patient will be seen 2 weeks for evaluation of tolerability  [name], MD, PhD Hematology Oncology [place] [facility] 07/19/2019

## 2019-07-15 LAB — SURGICAL PATHOLOGY

## 2019-07-19 ENCOUNTER — Other Ambulatory Visit: Payer: Self-pay | Admitting: Oncology

## 2019-07-19 ENCOUNTER — Encounter: Payer: Self-pay | Admitting: Oncology

## 2019-07-19 ENCOUNTER — Telehealth: Payer: Self-pay

## 2019-07-19 DIAGNOSIS — C50919 Malignant neoplasm of unspecified site of unspecified female breast: Secondary | ICD-10-CM

## 2019-07-19 DIAGNOSIS — Z7189 Other specified counseling: Secondary | ICD-10-CM | POA: Insufficient documentation

## 2019-07-19 HISTORY — DX: Malignant neoplasm of unspecified site of unspecified female breast: C50.919

## 2019-07-19 MED ORDER — ANASTROZOLE 1 MG PO TABS: 1.0000 mg | ORAL_TABLET | Freq: Every day | ORAL | 0 refills | Status: DC

## 2019-07-19 NOTE — Progress Notes (Signed)
START ON PATHWAY REGIMEN - Breast     A cycle is every 28 days:     Abemaciclib      Anastrozole   **Always confirm dose/schedule in your pharmacy ordering system**  Patient Characteristics: Distant Metastases or Locoregional Recurrent Disease - Unresected or Locally Advanced Unresectable Disease Progressing after Neoadjuvant and Local Therapies, HER2 Negative/Unknown/Equivocal, ER Positive, Endocrine Therapy, Postmenopausal, First Line,  Prior Adjuvant Endocrine Therapy Completed > 12 Months Ago, Candidate for CDK4/6 Inhibitor Therapeutic Status: Distant Metastases BRCA Mutation Status: Did Not Order Test ER Status: Positive (+) HER2 Status: Negative (-) PR Status: Positive (+) Menopausal Status: Postmenopausal Line of Therapy: First Line Previous Therapy: Prior Adjuvant Endocrine Therapy Completed > 12 Months Ago Intent of Therapy: Non-Curative / Palliative Intent, Discussed with Patient

## 2019-07-19 NOTE — Telephone Encounter (Signed)
Pt has been sched as requested. 1st available is 07/26/19  I'll contact pts daughter and make her aware.

## 2019-07-19 NOTE — Telephone Encounter (Signed)
LC, please schedule as requested below.  Also make appt lab/MD 2 weeks (cx next week).  Thanks  PET was denied. please switch her to CT chest wo contrast in 2-3 days. - reason breast cancer. thank you. i have advise pt to start her arimidex. ok with CT to be done after the initiation of arimidex, in case she asks. thank you

## 2019-07-26 ENCOUNTER — Telehealth: Payer: Medicare Other | Admitting: Oncology

## 2019-07-26 ENCOUNTER — Ambulatory Visit
Admission: RE | Admit: 2019-07-26 | Discharge: 2019-07-26 | Disposition: A | Discharge status: Home / Self Care | Payer: Medicare Other | Source: Ambulatory Visit | Attending: Oncology | Admitting: Oncology

## 2019-07-26 ENCOUNTER — Other Ambulatory Visit: Payer: Self-pay

## 2019-07-26 DIAGNOSIS — J439 Emphysema, unspecified: Secondary | ICD-10-CM | POA: Insufficient documentation

## 2019-07-26 DIAGNOSIS — N200 Calculus of kidney: Secondary | ICD-10-CM | POA: Insufficient documentation

## 2019-07-26 DIAGNOSIS — Z170 Estrogen receptor positive status [ER+]: Secondary | ICD-10-CM | POA: Diagnosis not present

## 2019-07-26 DIAGNOSIS — J90 Pleural effusion, not elsewhere classified: Secondary | ICD-10-CM | POA: Insufficient documentation

## 2019-07-26 DIAGNOSIS — I700 Atherosclerosis of aorta: Secondary | ICD-10-CM | POA: Diagnosis not present

## 2019-07-26 DIAGNOSIS — R918 Other nonspecific abnormal finding of lung field: Secondary | ICD-10-CM | POA: Diagnosis not present

## 2019-07-26 DIAGNOSIS — C50919 Malignant neoplasm of unspecified site of unspecified female breast: Secondary | ICD-10-CM | POA: Diagnosis not present

## 2019-07-27 ENCOUNTER — Telehealth: Payer: Self-pay | Admitting: Family

## 2019-07-27 NOTE — Telephone Encounter (Signed)
Pt is feeling sick on her stomach-it gets worse at night. Pt feels weak and thinks it may be the anastrozole (ARIMIDEX) 1 MG tablet. No appts available this week except Friday, same day. Please advise.

## 2019-07-27 NOTE — Telephone Encounter (Signed)
Spoke with pt and she stated that she does not feel sick to her stomach all the time it usually just happens after eating. She stated that she is not vomiting or having diarrhea. Pt also stated that she had issues with this before starting the medication but it has worsened since starting the anastrozole. Pt was advised to call oncologist to let the doctor know as well and see if he thinks it could be coming from the medication if he does not pt was advised to call the office back so we could get her an appt scheduled.

## 2019-07-28 NOTE — Telephone Encounter (Signed)
noted 

## 2019-07-28 NOTE — Telephone Encounter (Signed)
FYI I scheduled patient with Dr. Derrel Nip Monday since your 12p was taken for Friday. She said that she mostly nauseated at time & did vomit once last night. She stated that she mostly just has the dry heaves. She said that she did try oncology & was unable to reach. I asked her to PLEASE try to call them back today to ask about her anastrazole causing her to feel nauseated. She stated that she would do so. I asked patient if she felt comfortable waiting until Monday to be seen  & she said that was fine with her, she just wanted to see someone.

## 2019-07-28 NOTE — Telephone Encounter (Signed)
Circle back with pt Does she need an appt with [place]? Janett Billow was going to put her in 12 pm on Friday if she needed

## 2019-07-29 ENCOUNTER — Other Ambulatory Visit: Payer: Self-pay

## 2019-08-02 ENCOUNTER — Encounter: Payer: Self-pay | Admitting: Oncology

## 2019-08-02 ENCOUNTER — Encounter: Payer: Self-pay | Admitting: Internal Medicine

## 2019-08-02 ENCOUNTER — Inpatient Hospital Stay: Payer: Medicare Other | Attending: Oncology | Admitting: Oncology

## 2019-08-02 ENCOUNTER — Ambulatory Visit (INDEPENDENT_AMBULATORY_CARE_PROVIDER_SITE_OTHER): Payer: Medicare Other | Admitting: Internal Medicine

## 2019-08-02 ENCOUNTER — Inpatient Hospital Stay: Payer: Medicare Other

## 2019-08-02 ENCOUNTER — Other Ambulatory Visit: Payer: Self-pay

## 2019-08-02 VITALS — BP 150/76 | HR 97 | Temp 96.0°F | Resp 16

## 2019-08-02 DIAGNOSIS — J3481 Nasal mucositis (ulcerative): Secondary | ICD-10-CM | POA: Diagnosis not present

## 2019-08-02 DIAGNOSIS — C50919 Malignant neoplasm of unspecified site of unspecified female breast: Secondary | ICD-10-CM

## 2019-08-02 DIAGNOSIS — E1122 Type 2 diabetes mellitus with diabetic chronic kidney disease: Secondary | ICD-10-CM | POA: Insufficient documentation

## 2019-08-02 DIAGNOSIS — K219 Gastro-esophageal reflux disease without esophagitis: Secondary | ICD-10-CM

## 2019-08-02 DIAGNOSIS — R110 Nausea: Secondary | ICD-10-CM | POA: Diagnosis not present

## 2019-08-02 DIAGNOSIS — I129 Hypertensive chronic kidney disease with stage 1 through stage 4 chronic kidney disease, or unspecified chronic kidney disease: Secondary | ICD-10-CM

## 2019-08-02 DIAGNOSIS — D571 Sickle-cell disease without crisis: Secondary | ICD-10-CM | POA: Diagnosis not present

## 2019-08-02 DIAGNOSIS — E785 Hyperlipidemia, unspecified: Secondary | ICD-10-CM

## 2019-08-02 DIAGNOSIS — R519 Headache, unspecified: Secondary | ICD-10-CM | POA: Insufficient documentation

## 2019-08-02 DIAGNOSIS — K5909 Other constipation: Secondary | ICD-10-CM

## 2019-08-02 DIAGNOSIS — Z8261 Family history of arthritis: Secondary | ICD-10-CM | POA: Insufficient documentation

## 2019-08-02 DIAGNOSIS — N183 Chronic kidney disease, stage 3 unspecified: Secondary | ICD-10-CM

## 2019-08-02 DIAGNOSIS — Z170 Estrogen receptor positive status [ER+]: Secondary | ICD-10-CM

## 2019-08-02 DIAGNOSIS — Z803 Family history of malignant neoplasm of breast: Secondary | ICD-10-CM | POA: Insufficient documentation

## 2019-08-02 DIAGNOSIS — I2510 Atherosclerotic heart disease of native coronary artery without angina pectoris: Secondary | ICD-10-CM | POA: Diagnosis not present

## 2019-08-02 DIAGNOSIS — R5383 Other fatigue: Secondary | ICD-10-CM | POA: Diagnosis not present

## 2019-08-02 DIAGNOSIS — Z79811 Long term (current) use of aromatase inhibitors: Secondary | ICD-10-CM | POA: Diagnosis not present

## 2019-08-02 DIAGNOSIS — I252 Old myocardial infarction: Secondary | ICD-10-CM | POA: Diagnosis not present

## 2019-08-02 DIAGNOSIS — Z7189 Other specified counseling: Secondary | ICD-10-CM

## 2019-08-02 DIAGNOSIS — D649 Anemia, unspecified: Secondary | ICD-10-CM

## 2019-08-02 DIAGNOSIS — Z7982 Long term (current) use of aspirin: Secondary | ICD-10-CM | POA: Insufficient documentation

## 2019-08-02 DIAGNOSIS — Z79899 Other long term (current) drug therapy: Secondary | ICD-10-CM | POA: Insufficient documentation

## 2019-08-02 DIAGNOSIS — Z951 Presence of aortocoronary bypass graft: Secondary | ICD-10-CM

## 2019-08-02 DIAGNOSIS — D638 Anemia in other chronic diseases classified elsewhere: Secondary | ICD-10-CM | POA: Insufficient documentation

## 2019-08-02 DIAGNOSIS — Z794 Long term (current) use of insulin: Secondary | ICD-10-CM | POA: Diagnosis not present

## 2019-08-02 DIAGNOSIS — Z833 Family history of diabetes mellitus: Secondary | ICD-10-CM | POA: Diagnosis not present

## 2019-08-02 LAB — COMPREHENSIVE METABOLIC PANEL
ALT: 22 U/L (ref 0–44)
AST: 30 U/L (ref 15–41)
Albumin: 3.3 g/dL — ABNORMAL LOW (ref 3.5–5.0)
Alkaline Phosphatase: 88 U/L (ref 38–126)
Anion gap: 13 (ref 5–15)
BUN: 19 mg/dL (ref 8–23)
CO2: 21 mmol/L — ABNORMAL LOW (ref 22–32)
Calcium: 9.3 mg/dL (ref 8.9–10.3)
Chloride: 97 mmol/L — ABNORMAL LOW (ref 98–111)
Creatinine, Ser: 1.12 mg/dL — ABNORMAL HIGH (ref 0.44–1.00)
GFR calc Af Amer: 50 mL/min — ABNORMAL LOW (ref >60)
GFR calc non Af Amer: 44 mL/min — ABNORMAL LOW (ref >60)
Glucose, Bld: 230 mg/dL — ABNORMAL HIGH (ref 70–99)
Potassium: 4.3 mmol/L (ref 3.5–5.1)
Sodium: 131 mmol/L — ABNORMAL LOW (ref 135–145)
Total Bilirubin: 0.4 mg/dL (ref 0.3–1.2)
Total Protein: 7 g/dL (ref 6.5–8.1)

## 2019-08-02 LAB — CBC WITH DIFFERENTIAL/PLATELET
Abs Immature Granulocytes: 0.04 10*3/uL (ref 0.00–0.07)
Basophils Absolute: 0.1 10*3/uL (ref 0.0–0.1)
Basophils Relative: 1 %
Eosinophils Absolute: 0.1 10*3/uL (ref 0.0–0.5)
Eosinophils Relative: 1 %
HCT: 29.7 % — ABNORMAL LOW (ref 36.0–46.0)
Hemoglobin: 9.4 g/dL — ABNORMAL LOW (ref 12.0–15.0)
Immature Granulocytes: 0 %
Lymphocytes Relative: 8 %
Lymphs Abs: 0.7 10*3/uL (ref 0.7–4.0)
MCH: 28.7 pg (ref 26.0–34.0)
MCHC: 31.6 g/dL (ref 30.0–36.0)
MCV: 90.5 fL (ref 80.0–100.0)
Monocytes Absolute: 0.4 10*3/uL (ref 0.1–1.0)
Monocytes Relative: 5 %
Neutro Abs: 7.6 10*3/uL (ref 1.7–7.7)
Neutrophils Relative %: 85 %
Platelets: 540 10*3/uL — ABNORMAL HIGH (ref 150–400)
RBC: 3.28 MIL/uL — ABNORMAL LOW (ref 3.87–5.11)
RDW: 13.6 % (ref 11.5–15.5)
WBC: 9 10*3/uL (ref 4.0–10.5)
nRBC: 0 % (ref 0.0–0.2)

## 2019-08-02 LAB — VITAMIN B12: Vitamin B-12: 621 pg/mL (ref 180–914)

## 2019-08-02 LAB — IRON AND TIBC
Iron: 32 ug/dL (ref 28–170)
Saturation Ratios: 14 % (ref 10.4–31.8)
TIBC: 223 ug/dL — ABNORMAL LOW (ref 250–450)
UIBC: 191 ug/dL

## 2019-08-02 LAB — FERRITIN: Ferritin: 137 ng/mL (ref 11–307)

## 2019-08-02 LAB — FOLATE: Folate: 60 ng/mL (ref >5.9)

## 2019-08-02 MED ORDER — MUPIROCIN 2 % EX OINT: 1.0000 "application " | TOPICAL_OINTMENT | Freq: Two times a day (BID) | CUTANEOUS | 0 refills | Status: DC

## 2019-08-02 MED ORDER — SENNA 8.6 MG PO TABS: 2.0000 | ORAL_TABLET | Freq: Every day | ORAL | 1 refills | Status: DC

## 2019-08-02 MED ORDER — PANTOPRAZOLE SODIUM 40 MG PO TBEC: 40.0000 mg | DELAYED_RELEASE_TABLET | Freq: Every day | ORAL | 3 refills | Status: DC

## 2019-08-02 MED ORDER — ONDANSETRON HCL 4 MG PO TABS: 4.0000 mg | ORAL_TABLET | Freq: Three times a day (TID) | ORAL | 1 refills | Status: DC | PRN

## 2019-08-02 NOTE — Progress Notes (Signed)
Hematology/Oncology  Follow up note [facility] Telephone:([phone]) [phone] [phone]   Patient Care Team: [name], FNP as PCP - General (Family Medicine) Rockey Situ [name], MD as PCP - Cardiology (Cardiology)  REFERRING PROVIDER: [name], FNP  CHIEF COMPLAINTS/REASON FOR VISIT:  Follow up for abnormal CT/PET  HISTORY OF PRESENTING ILLNESS:   Tiffany Bailey is a  86 y.o.  female with PMH listed below was seen in consultation at the request of  [name], FNP  for evaluation of abnormal CT scan. Patient was recently evaluated by primary care provider for complaints of urinary retention, headache, constipation. 02/03/2019 x-ray of abdomen showed large colonic stool volume, nodular densities noted in the right mid lung are up). 02/08/2019 subsequent chest x-ray two-view showed hyperinflation of the lungs compatible with COPD.  Multiple nodular densities project over the mid and lower right lung. CT chest was obtained for further evaluation. 02/17/2019 CT chest without contrast Showed multiple bilateral pulmonary nodules,groundglass opacities. Largest solid nodule at the confluence of the right major and minor fissures measuring 1.6 cm.  Most notable groundglass nodule is of the right upper lobe and measure 1.8 x 0.8 cm, small right pleural effusion with associated atelectasis or consolidation.  Larger solid nodule are highly suspicious for metastatic disease. Bulky right mediastinal and hilar lymph node.  Largest pretracheal nodes measuring 3.5 x 3 cm.  There are additional bulky superior mediastinal, supraclavicular, left subpectoral lymph nodes.  Largest subpectoral nodes measuring 3.3 x 2.5 cm.  Patient was sent to cancer center for further evaluation.  #Patient reports a history of left breast cancer status post mastectomy.  She is a poor historian.  She is not able to recall the timeframe of breast cancer diagnosis and then  treatments.  Denies any chemotherapy or radiation treatments.  #Left lower extremity history of amputation, CKD, History of MI, Sickle cell disease, DM, HTN, anemia.   Patient denies shortness of breath, chest pain, hemoptysis.  Night sweating, abdominal pain. Reports intentional weight loss.  Appetite is poor.  Not eating much. Also have wax and wane, chronic intermittent headache, she takes Tylenol as needed, with some relief.  Denies any nausea vomiting. She sees neurology. 11/10/2018 CT head showed no acute intracranial abnormality or significant interval changes.  Stable atrophy and white matter disease.  Atherosclerosis.  #Patient had a consultation visit with me on 02/19/2019.  At that time he was referred for abnormal CT scan.  PET scan was obtained and I recommend patient to obtain left axillary lymph node biopsy.  Patient/family called back and decided not to proceed with any additional work-up.  INTERVAL HISTORY Tiffany Bailey is a 86 y.o. female who has above history reviewed by me today presents for follow up visit for management of metastatic breast cancer. Problems and complaints are listed below: He was accompanied by her son today.  Repeat PET was denied and patient had CT chest done.  She has started Armimidex  And tolerates well.  Main concern is constipation and also feeling nausea.  Pain is better.  No sob.   Review of Systems  Constitutional: Positive for fatigue and unexpected weight change. Negative for chills and fever.  HENT:   Negative for hearing loss and voice change.   Eyes: Negative for eye problems.  Respiratory: Negative for chest tightness, cough and shortness of breath.   Cardiovascular: Negative for chest pain.       Intermittent left anterior chest wall/axillary sharp pain  Gastrointestinal: Positive for constipation. Negative for abdominal distention, abdominal pain and blood in stool.  Endocrine: Negative for hot flashes.  Genitourinary: Negative for  difficulty urinating and frequency.   Musculoskeletal: Negative for arthralgias.       Left lower extremity history of amputation  Skin: Negative for itching and rash.  Neurological: Positive for headaches. Negative for extremity weakness.  Hematological: Negative for adenopathy.  Psychiatric/Behavioral: Negative for confusion.    MEDICAL HISTORY:  Past Medical History:  Diagnosis Date  . Anemia   . Anginal pain ([place])   . Arthritis   . Breast cancer ([place]) 07/19/2019  . Cancer (HCC)    BREAST  . CKD (chronic kidney disease), stage III   . Coronary artery disease    S/P CABG and multiple PCI's  . Diabetes mellitus without complication ([place])   . Edema   . Failure to thrive in adult   . GERD (gastroesophageal reflux disease)   . Hb-SS disease without crisis ([place]) 10/23/2017  . History of breast cancer   . HOH (hard of hearing)   . Hyperlipidemia   . Hypertension   . Myocardial infarction ([place])   . Palpitations     SURGICAL HISTORY: Past Surgical History:  Procedure Laterality Date  . ABDOMINAL HYSTERECTOMY    . ABOVE KNEE LEG AMPUTATION Left 2013  . ANTERIOR VITRECTOMY Left 11/16/2015   Procedure: ANTERIOR VITRECTOMY;  Surgeon: Eulogio Bear, MD;  Location: [facility] ORS;  Service: Ophthalmology;  Laterality: Left;  . BLADDER SURGERY    . CATARACT EXTRACTION W/PHACO Left 11/16/2015   Procedure: CATARACT EXTRACTION PHACO AND INTRAOCULAR LENS PLACEMENT (IOC);  Surgeon: Eulogio Bear, MD;  Location: [facility] ORS;  Service: Ophthalmology;  Laterality: Left;  Lot # [id number] H [place]; 01:22.8 AP%:12.6 CDE: 10.46  . CATARACT EXTRACTION W/PHACO Right 08/22/2016   Procedure: CATARACT EXTRACTION PHACO AND INTRAOCULAR LENS PLACEMENT (IOC);  Surgeon: Eulogio Bear, MD;  Location: [facility] ORS;  Service: Ophthalmology;  Laterality: Right;  Lot # [id number] H [place]: 00:52.1 AP%:8.7 CDE: 4.99   . CORONARY ANGIOPLASTY     STENT  . CORONARY ARTERY BYPASS GRAFT    . LOWER EXTREMITY ANGIOGRAPHY Right  01/14/2019   Procedure: LOWER EXTREMITY ANGIOGRAPHY;  Surgeon: Algernon Huxley, MD;  Location: [place] [facility];  Service: Cardiovascular;  Laterality: Right;  . MASTECTOMY    . TUMOR REMOVAL     ABDOMINAL    SOCIAL HISTORY: Social History   Socioeconomic History  . Marital status: Divorced    Spouse name: Not on file  . Number of children: 7  . Years of education: Not on file  . Highest education level: Not on file  Occupational History  . 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 Smoking status: Never Smoker  . Smokeless tobacco: Never Used  Substance and Sexual Activity  . Alcohol use: No  . Drug use: No  . Sexual activity: Not Currently  Other Topics Concern  . Not on file  Social History Narrative  . Not on file   Social Determinants of Health   Financial Resource Strain: Low Risk   . Difficulty of Paying Living Expenses: Not hard at all  Food Insecurity: No Food Insecurity  . Worried About Charity fundraiser in the Last Year: Never true  . Ran Out of Food in the Last Year: Never true  Transportation Needs: No Transportation Needs  . Lack of Transportation (Medical): No  . Lack of Transportation (  Non-Medical): No  Physical Activity: Insufficiently Active  . Days of Exercise per Week: 7 days  . Minutes of Exercise per Session: 20 min  Stress: No Stress Concern Present  . Feeling of Stress : Only a little  Social Connections: Unknown  . Frequency of Communication with Friends and Family: More than three times a week  . Frequency of Social Gatherings with Friends and Family: Not on file  . Attends Religious Services: Not on file  . Active Member of Clubs or Organizations: Not on file  . Attends Archivist Meetings: Not on file  . Marital Status: Not on file  Intimate Partner Violence: Not At Risk  . Fear of Current or Ex-Partner: No  . Emotionally Abused: No  . Physically Abused: No  . Sexually Abused: No    FAMILY  HISTORY: Family History  Problem Relation Age of Onset  . Sudden death Mother   . Arthritis Father   . Stroke Father   . Breast cancer Sister   . Diabetes Grandchild     ALLERGIES:  is allergic to amoxicillin; gabapentin; mirtazapine; and other.  MEDICATIONS:  Current Outpatient Medications  Medication Sig Dispense Refill  . acetaminophen (TYLENOL) 500 MG tablet Take 1 tablet (500 mg total) by mouth every 6 (six) hours as needed. 30 tablet 0  . amLODipine (NORVASC) 10 MG tablet Take 1 tablet (10 mg total) by mouth daily. 90 tablet 3  . anastrozole (ARIMIDEX) 1 MG tablet Take 1 tablet (1 mg total) by mouth daily. 90 tablet 0  . aspirin EC 81 MG tablet Take 1 tablet (81 mg total) by mouth daily. 30 tablet 5  . atorvastatin (LIPITOR) 40 MG tablet TAKE 1 TABLET BY MOUTH  DAILY 90 tablet 3  . blood glucose meter kit and supplies KIT Dispense based on patient and insurance preference. Use up to four times daily as directed. (FOR ICD-9 250.00, 250.01). 1 each 0  . clopidogrel (PLAVIX) 75 MG tablet Take 1 tablet (75 mg total) by mouth daily. 90 tablet 3  . cyclobenzaprine (FLEXERIL) 5 MG tablet Take 1 tablet (5 mg total) by mouth at bedtime as needed for muscle spasms. 30 tablet 0  . fluticasone (FLONASE) 50 MCG/ACT nasal spray Place 1 spray into both nostrils daily. 16 g 5  . glucose blood (ONE TOUCH TEST STRIPS) test strip Use to test blood sugar twice daily 200 each 3  . hydrALAZINE (APRESOLINE) 10 MG tablet Take 10 mg by mouth 2 (two) times daily.    . Insulin Glargine (BASAGLAR KWIKPEN) 100 UNIT/ML SOPN Inject 0.06 mLs (6 Units total) into the skin daily. [place] [facility] as of  01/21/2019    . Insulin Pen Needle (BD PEN NEEDLE NANO U/F) 32G X 4 MM MISC USE 1 DAILY 100 each 12  . metFORMIN (GLUCOPHAGE) 500 MG tablet Take 500 mg by mouth daily with breakfast.     . metoprolol tartrate (LOPRESSOR) 25 MG tablet TAKE 1 TABLET BY MOUTH TWO  TIMES DAILY 180 tablet 3  . Multiple Vitamin (MULTIVITAMIN)  tablet Take 1 tablet by mouth daily.    . nitroGLYCERIN (NITROSTAT) 0.4 MG SL tablet Place 1 tablet (0.4 mg total) under the tongue every 5 (five) minutes as needed for chest pain. 25 tablet 4  . ONE TOUCH LANCETS MISC Use as directed 2 times per day 200 each 3  . mupirocin ointment (BACTROBAN) 2 % Place 1 application into the nose 2 (two) times daily. 22 g 0  .  ondansetron (ZOFRAN) 4 MG tablet Take 1 tablet (4 mg total) by mouth every 8 (eight) hours as needed for nausea or vomiting. 30 tablet 1  . pantoprazole (PROTONIX) 40 MG tablet Take 1 tablet (40 mg total) by mouth at bedtime. 30 tablet 3  . senna (SENOKOT) 8.6 MG TABS tablet Take 2 tablets (17.2 mg total) by mouth daily. Hold if diarrhea. 30 tablet 1   No current facility-administered medications for this visit.     PHYSICAL EXAMINATION: ECOG PERFORMANCE STATUS: 2 - Symptomatic, <50% confined to bed Vitals:   08/02/19 1106  BP: (!) 150/76  Pulse: 97  Resp: 16  Temp: (!) 96 F (35.6 C)   There were no vitals filed for this visit.  Physical Exam Constitutional:      General: She is not in acute distress.    Comments: She sits in the wheel chair.  Thin  built  HENT:     Head: Normocephalic and atraumatic.  Eyes:     General: No scleral icterus.    Pupils: Pupils are equal, round, and reactive to light.  Cardiovascular:     Rate and Rhythm: Normal rate and regular rhythm.     Heart sounds: Normal heart sounds.  Pulmonary:     Effort: Pulmonary effort is normal. No respiratory distress.     Breath sounds: No wheezing.     Comments: Severely diminished breath sounds bilaterally.   Abdominal:     General: Bowel sounds are normal. There is no distension.     Palpations: Abdomen is soft.  Musculoskeletal:        General: No deformity. Normal range of motion.     Cervical back: Normal range of motion and neck supple.     Comments: Left lower extremity history of amputation.   Skin:    General: Skin is warm and dry.      Findings: No erythema or rash.  Neurological:     Mental Status: She is alert and oriented to person, place, and time. Mental status is at baseline.     Cranial Nerves: No cranial nerve deficit.     Coordination: Coordination normal.  Psychiatric:        Mood and Affect: Mood normal.        Behavior: Behavior normal.        Thought Content: Thought content normal.      LABORATORY DATA:  I have reviewed the data as listed Lab Results  Component Value Date   WBC 9.0 08/02/2019   HGB 9.4 (L) 08/02/2019   HCT 29.7 (L) 08/02/2019   MCV 90.5 08/02/2019   PLT 540 (H) 08/02/2019   Recent Labs    02/19/19 1145 02/19/19 1145 02/26/19 0828 06/24/19 0942 08/02/19 1040  NA 132*   < > 140 137 131*  K 4.3   < > 4.0 3.6 4.3  CL 102   < > 106 104 97*  CO2 22   < > 25 22 21*  GLUCOSE 285*   < > 55* 77 230*  BUN 17   < > _0 CREATININE 1.07*   < > 1.06 0.91 1.12*  CALCIUM 9.0   < > 9.6 9.5 9.3  GFRNONAA 46*  --   --  56* 44*  GFRAA 54*  --   --  >60 50*  PROT 6.4*   < > 6.1 7.3 7.0  ALBUMIN 3.6   < > 3.8 4.0 3.3*  AST 19   < >  _0 ALT 15   < > _1 ALKPHOS 79   < > 74 84 88  BILITOT 0.5   < > 0.5 0.6 0.4   < > = values in this interval not displayed.   Iron/TIBC/Ferritin/ %Sat    Component Value Date/Time   IRON 32 08/02/2019 1040   TIBC 223 (L) 08/02/2019 1040   FERRITIN 137 08/02/2019 1040   IRONPCTSAT 14 08/02/2019 1040   IRONPCTSAT 27 06/19/2018 1006      RADIOGRAPHIC STUDIES: I have personally reviewed the radiological images as listed and agreed with the findings in the report.  CT Chest Wo Contrast  Result Date: 07/26/2019 CLINICAL DATA:  Breast cancer.  Lung nodules. EXAM: CT CHEST WITHOUT CONTRAST TECHNIQUE: Multidetector CT imaging of the chest was performed following the standard protocol without IV contrast. COMPARISON:  PET 02/25/2019 in CT chest 02/17/2019. FINDINGS: Cardiovascular: Atherosclerotic calcification of the aorta and aortic  valve. Heart size normal. No pericardial effusion. Mediastinum/Nodes: High right periesophageal lymph node measures 10 mm (2/16), stable. Mediastinal adenopathy measures up to approximately 2.1 cm in short axis in the low right paratracheal station, similar. Partially calcified subcarinal lymph node measures 1.9 cm, stable. Hilar regions are difficult to evaluate without IV contrast. Left subpectoral and axillary adenopathy measures up to 2.2 cm (2/32), stable. No right axillary adenopathy. Esophagus is grossly unremarkable. Lungs/Pleura: Right pleural effusion is now moderate in size with associated compressive atelectasis in the right lower lobe. Right-sided pleural nodules and masses have enlarged significantly. Index pleural mass in the posterior lower right hemithorax measures 2.7 x 4.4 cm (2/99), compared to 1.2 x 1.9 cm on 02/17/2019. Enlarging ground-glass and solid nodules bilaterally. Index left lower lobe nodule measures 6 x 12 mm (4/107), previously 4 x 9 mm on 02/17/2019. Mild centrilobular emphysema. Airway is unremarkable. Upper Abdomen: Visualized portion of the liver is unremarkable. Adrenal glands appear thickened, as before. Low-attenuation lesions in the right kidney measure up to 3.9 cm, incompletely imaged. Stones in the kidneys bilaterally. Visualized portions of the spleen pancreas, stomach and bowel are unremarkable with the exception of a small hiatal hernia. Musculoskeletal: Degenerative changes in the spine. Old left posterolateral rib fracture. No worrisome lytic or sclerotic lesions. Flowing anterior osteophytosis in the thoracic spine. IMPRESSION: 1. Progression of pleuroparenchymal metastatic disease as evidenced by enlargement in the right pleural effusion, right pleural nodules/masses and bilateral pulmonary nodules. 2. Bulky mediastinal and left subpectoral/axillary, stable. 3. Bilateral renal stones. 4.  Aortic atherosclerosis (ICD10-I70.0). 5.  Emphysema (ICD10-J43.9).  Electronically Signed   By: Lorin Picket M.D.   On: 07/26/2019 08:13   [place] CORE BIOPSY (LYMPH NODES)  Result Date: 07/06/2019 INDICATION: Left axillary adenopathy EXAM: ULTRASOUND GUIDED CORE  BIOPSY OF LEFT SUBPECTORAL LYMPH NODE MEDICATIONS: None. ANESTHESIA/SEDATION: None PROCEDURE: The procedure, risks, benefits, and alternatives were explained to the patient. Questions regarding the procedure were encouraged and answered. The patient understands and consents to the procedure. The left axilla was prepped with chlorhexidine in a sterile fashion, and a sterile drape was applied covering the operative field. A sterile gown and sterile gloves were used for the procedure. Local anesthesia was provided with 1% Lidocaine. Utilizing real-time ultrasound guidance a 17 gauge guiding needle was placed percutaneously into the left subpectoral lymph node mass. Multiple 18 gauge core biopsies were then obtained without difficulty. These were sent on moistened Telfa for pathologic evaluation. The patient tolerated the procedure well. Needle was removed without difficulty and hemostasis  obtained at the puncture site. Puncture site was then dressed in the standard sterile manner. COMPLICATIONS: None immediate. FINDINGS: Two subpectoral lymph node masses are seen similar to that noted on prior PET-CT. IMPRESSION: Successful ultrasound-guided biopsy of left subpectoral lymph node mass. Electronically Signed   By: [name] M.D.   On: 07/06/2019 12:19      ASSESSMENT & PLAN:  1. Malignant neoplasm of breast in female, estrogen receptor positive, unspecified laterality, unspecified site of breast ([place])   2. Metastatic breast cancer ([place])   3. Other constipation   4. Nausea without vomiting   5. Use of anastrozole (Arimidex)    #Metastatic breast cancer, Pathology was reviewed and discussed with patient.  Adenocarcinoma with breast origin. stage IV disease, which is not curable.  Goal of care: Palliative. Labs  are reviewed and discussed with patient. Continue Arimidex 72m daily.  CT images were reviewed by me and discussed with patient and son.  My original plan was to start with both Amrimidex and Abemaciclib. I discussed with her daughter during the interval and given patient's age and PS, will start with Armimidex and maybe if she is doing well, add Abemaciclib in the future.  Normocytic anemia, will check iron panel, folate, vitamin B12 level are normal. Anemia due to chronic disease.   # Nausea, recommend zofran 444mQ8 hours as needed.  # Constipation, add Senakot 2 tablets daily.    Orders Placed This Encounter  Procedures  . CBC with Differential/Platelet    Standing Status:   Future    Standing Expiration Date:   08/01/2020  . Comprehensive metabolic panel    Standing Status:   Future    Standing Expiration Date:   08/01/2020    All questions were answered. The patient knows to call the clinic with any problems questions or concerns. Return of visit: 2 weeks.   ZhEarlie ServerMD, PhD Hematology Oncology [facility]- [id number]/15/2021

## 2019-08-02 NOTE — Progress Notes (Signed)
Subjective:  Patient ID: Tiffany Bailey, female    DOB: 1928/05/05  Age: 86 y.o. MRN: [id number]  CC: Diagnoses of Metastatic breast cancer ([place]), Ulcerative nasal mucositis, and Nausea were pertinent to this visit.  HPI Tiffany Bailey presents for evaluation of nausea,  Constipation,  Bloody nasal discharge  This visit occurred during the SARS-CoV-2 public health emergency.  Safety protocols were in place, including screening questions prior to the visit, additional usage of staff PPE, and extensive cleaning of exam room while observing appropriate contact time as indicated for disinfecting solutions.     Tiffany Bailey is an 86 yr old female with metastatic breast cancer by recent CT chest (mets to lungs) , unintentional weight loss,  Type 2 DM with CKD and prior left AKA ,  Chronic constipation who was seen today for management of constipation,  Bloody nasal discharge, and nausea.  She was also seen by her oncologist today .  Her constipation has not been treated except with home remedies including prunes and metamucil.  She has small caliber bowel movements.  Denies abd pain    Oncologist gave her an rx for  sennakot today   Nausea,  Anorexia chronic . Eating only a few bites at a time.  Feels weak in the morning,  But eats a good breakfast,  Then has No appetite by lunch . Tries to drink an Ensure but says it tastes too sweet, and gags on it .  Feels  like she has reflux in the morning which makes her cough both during the day and at night    Outpatient Medications Prior to Visit  Medication Sig Dispense Refill  . acetaminophen (TYLENOL) 500 MG tablet Take 1 tablet (500 mg total) by mouth every 6 (six) hours as needed. 30 tablet 0  . amLODipine (NORVASC) 10 MG tablet Take 1 tablet (10 mg total) by mouth daily. 90 tablet 3  . anastrozole (ARIMIDEX) 1 MG tablet Take 1 tablet (1 mg total) by mouth daily. 90 tablet 0  . aspirin EC 81 MG tablet Take 1 tablet (81 mg total) by mouth daily. 30  tablet 5  . atorvastatin (LIPITOR) 40 MG tablet TAKE 1 TABLET BY MOUTH  DAILY 90 tablet 3  . blood glucose meter kit and supplies KIT Dispense based on patient and insurance preference. Use up to four times daily as directed. (FOR ICD-9 250.00, 250.01). 1 each 0  . clopidogrel (PLAVIX) 75 MG tablet Take 1 tablet (75 mg total) by mouth daily. 90 tablet 3  . cyclobenzaprine (FLEXERIL) 5 MG tablet Take 1 tablet (5 mg total) by mouth at bedtime as needed for muscle spasms. 30 tablet 0  . fluticasone (FLONASE) 50 MCG/ACT nasal spray Place 1 spray into both nostrils daily. 16 g 5  . glucose blood (ONE TOUCH TEST STRIPS) test strip Use to test blood sugar twice daily 200 each 3  . hydrALAZINE (APRESOLINE) 10 MG tablet Take 10 mg by mouth 2 (two) times daily.    . Insulin Glargine (BASAGLAR KWIKPEN) 100 UNIT/ML SOPN Inject 0.06 mLs (6 Units total) into the skin daily. [facility] as of  01/21/2019    . Insulin Pen Needle (BD PEN NEEDLE NANO U/F) 32G X 4 MM MISC USE 1 DAILY 100 each 12  . metFORMIN (GLUCOPHAGE) 500 MG tablet Take 500 mg by mouth daily with breakfast.     . metoprolol tartrate (LOPRESSOR) 25 MG tablet TAKE 1 TABLET BY MOUTH TWO  TIMES DAILY 180  tablet 3  . Multiple Vitamin (MULTIVITAMIN) tablet Take 1 tablet by mouth daily.    . nitroGLYCERIN (NITROSTAT) 0.4 MG SL tablet Place 1 tablet (0.4 mg total) under the tongue every 5 (five) minutes as needed for chest pain. 25 tablet 4  . ondansetron (ZOFRAN) 4 MG tablet Take 1 tablet (4 mg total) by mouth every 8 (eight) hours as needed for nausea or vomiting. 30 tablet 1  . ONE TOUCH LANCETS MISC Use as directed 2 times per day 200 each 3  . senna (SENOKOT) 8.6 MG TABS tablet Take 2 tablets (17.2 mg total) by mouth daily. Hold if diarrhea. 30 tablet 1   No facility-administered medications prior to visit.    Review of Systems;  Patient denies headache, fevers, malaise, , skin rash, eye pain, sinus congestion and sinus pain, sore throat,  dysphagia,  hemoptysis ,  dyspnea, wheezing, chest pain, palpitations, orthopnea, edema, abdominal pain, , melena, diarrhea, , flank pain, dysuria, hematuria, urinary  Frequency, nocturia, numbness, tingling, seizures,  Focal weakness, Loss of consciousness,  Tremor, insomnia, depression, anxiety, and suicidal ideation.      Objective:  BP (!) 150/76   Pulse 97   Temp (!) 96 F (35.6 C) (Temporal)   Ht 5' (1.524 m)   Wt 80 lb (36.3 kg)   SpO2 98%   BMI 15.62 kg/m   BP Readings from Last 3 Encounters:  08/02/19 (!) 150/76  08/02/19 (!) 150/76  07/13/19 (!) 149/56    Wt Readings from Last 3 Encounters:  08/02/19 80 lb (36.3 kg)  07/13/19 83 lb 3.2 oz (37.7 kg)  07/06/19 81 lb 4.8 oz (36.9 kg)    General appearance: cachectic, alert, cooperative and appears stated age. Seated in wheelchair.  Ears: normal TM's and external ear canals both ears Scalp: alopecia with hair thinning,  Throat: lips, mucosa, and tongue normal; teeth and gums normal Neck: no adenopathy, no carotid bruit, supple, symmetrical, trachea midline and thyroid not enlarged, symmetric, no tenderness/mass/nodules Back: symmetric, no curvature. ROM normal. No CVA tenderness. Lungs: clear to auscultation bilaterally Heart: regular rate and rhythm, S1, S2 normal, no murmur, click, rub or gallop Abdomen: soft, non-tender; bowel sounds normal; no masses,  no organomegaly Pulses: 2+ and symmetric Skin: Skin color, texture, turgor normal. No rashes or lesions Ext:  Left AKA  Lymph nodes: Cervical, supraclavicular, and axillary nodes normal.  Lab Results  Component Value Date   HGBA1C 8.2 (H) 02/26/2019   HGBA1C 9.3 (H) 08/27/2017   HGBA1C 8.7 (H) 04/08/2017    Lab Results  Component Value Date   CREATININE 1.12 (H) 08/02/2019   CREATININE 0.91 06/24/2019   CREATININE 1.06 02/26/2019    Lab Results  Component Value Date   WBC 9.0 08/02/2019   HGB 9.4 (L) 08/02/2019   HCT 29.7 (L) 08/02/2019   PLT 540  (H) 08/02/2019   GLUCOSE 230 (H) 08/02/2019   CHOL 136 02/26/2019   TRIG 63.0 02/26/2019   HDL 46.50 02/26/2019   LDLCALC 76 02/26/2019   ALT 22 08/02/2019   AST 30 08/02/2019   NA 131 (L) 08/02/2019   K 4.3 08/02/2019   CL 97 (L) 08/02/2019   CREATININE 1.12 (H) 08/02/2019   BUN 19 08/02/2019   CO2 21 (L) 08/02/2019   TSH 1.60 11/20/2017   INR 1.0 06/24/2019   HGBA1C 8.2 (H) 02/26/2019   MICROALBUR 6.9 (H) 01/25/2019    CT Chest Wo Contrast  Result Date: 07/26/2019 CLINICAL DATA:  Breast cancer.  Lung nodules. EXAM: CT CHEST WITHOUT CONTRAST TECHNIQUE: Multidetector CT imaging of the chest was performed following the standard protocol without IV contrast. COMPARISON:  PET 02/25/2019 in CT chest 02/17/2019. FINDINGS: Cardiovascular: Atherosclerotic calcification of the aorta and aortic valve. Heart size normal. No pericardial effusion. Mediastinum/Nodes: High right periesophageal lymph node measures 10 mm (2/16), stable. Mediastinal adenopathy measures up to approximately 2.1 cm in short axis in the low right paratracheal station, similar. Partially calcified subcarinal lymph node measures 1.9 cm, stable. Hilar regions are difficult to evaluate without IV contrast. Left subpectoral and axillary adenopathy measures up to 2.2 cm (2/32), stable. No right axillary adenopathy. Esophagus is grossly unremarkable. Lungs/Pleura: Right pleural effusion is now moderate in size with associated compressive atelectasis in the right lower lobe. Right-sided pleural nodules and masses have enlarged significantly. Index pleural mass in the posterior lower right hemithorax measures 2.7 x 4.4 cm (2/99), compared to 1.2 x 1.9 cm on 02/17/2019. Enlarging ground-glass and solid nodules bilaterally. Index left lower lobe nodule measures 6 x 12 mm (4/107), previously 4 x 9 mm on 02/17/2019. Mild centrilobular emphysema. Airway is unremarkable. Upper Abdomen: Visualized portion of the liver is unremarkable. Adrenal  glands appear thickened, as before. Low-attenuation lesions in the right kidney measure up to 3.9 cm, incompletely imaged. Stones in the kidneys bilaterally. Visualized portions of the spleen pancreas, stomach and bowel are unremarkable with the exception of a small hiatal hernia. Musculoskeletal: Degenerative changes in the spine. Old left posterolateral rib fracture. No worrisome lytic or sclerotic lesions. Flowing anterior osteophytosis in the thoracic spine. IMPRESSION: 1. Progression of pleuroparenchymal metastatic disease as evidenced by enlargement in the right pleural effusion, right pleural nodules/masses and bilateral pulmonary nodules. 2. Bulky mediastinal and left subpectoral/axillary, stable. 3. Bilateral renal stones. 4.  Aortic atherosclerosis (ICD10-I70.0). 5.  Emphysema (ICD10-J43.9). Electronically Signed   By: Lorin Picket M.D.   On: 07/26/2019 08:13    Assessment & Plan:   Problem List Items Addressed This Visit      Unprioritized   Metastatic breast cancer ([place])    Vs new primary lung CA with mediastinal LAD and pleural based masses which has enlarged by Feb 2021 CT.  Marland Kitchen  Workup deferred by patient and family .  Needs to have goals of care discussion with PCP and Palliative Care consult      Ulcerative nasal mucositis    Suspected given absence of facial pain to suggest sinusitis.  Mupirocin ointment prescribed for bid use to nasal mucosa       Nausea    Given her symptoms of reflux,  Will start Protonix empirically for gastritis   , may need to image brain if nausea is not improved with treatment of gastritis          I am having Tiffany Bailey start on mupirocin ointment and pantoprazole. I am also having her maintain her blood glucose meter kit and supplies, acetaminophen, aspirin EC, ONE TOUCH LANCETS, glucose blood, nitroGLYCERIN, fluticasone, Insulin Pen Needle, cyclobenzaprine, amLODipine, clopidogrel, metFORMIN, Basaglar KwikPen, metoprolol tartrate,  hydrALAZINE, atorvastatin, multivitamin, anastrozole, senna, and ondansetron.  Meds ordered this encounter  Medications  . mupirocin ointment (BACTROBAN) 2 %    Sig: Place 1 application into the nose 2 (two) times daily.    Dispense:  22 g    Refill:  0  . pantoprazole (PROTONIX) 40 MG tablet    Sig: Take 1 tablet (40 mg total) by mouth at bedtime.    Dispense:  30 tablet  Refill:  3    I provided  30 minutes of non-face-to-face time during this encounter reviewing patient's current problems and post surgeries.  Providing counseling on the above mentioned problems , and coordination  of care .  There are no discontinued medications.  Follow-up: No follow-ups on file.   Crecencio Mc, MD

## 2019-08-02 NOTE — Patient Instructions (Addendum)
Premier Protein is a better tasting supplement than Ensure  ,  Has low sugar And is available at [employer].  Make sure you chill it before you drink it.    Your cough and your nausea  might be coming from reflux.  I am starting you on a medicine  to reduce the acid in your stomach,   take it at bedtime or 2 hours after dinner (on en empty stomach)   I have prescrbied an antibiotic ointment use twice daily inside the nose ,

## 2019-08-03 DIAGNOSIS — J3481 Nasal mucositis (ulcerative): Secondary | ICD-10-CM | POA: Insufficient documentation

## 2019-08-03 DIAGNOSIS — R110 Nausea: Secondary | ICD-10-CM | POA: Insufficient documentation

## 2019-08-03 NOTE — Assessment & Plan Note (Signed)
Suspected given absence of facial pain to suggest sinusitis.  Mupirocin ointment prescribed for bid use to nasal mucosa

## 2019-08-03 NOTE — Telephone Encounter (Signed)
Seen by TT

## 2019-08-03 NOTE — Assessment & Plan Note (Addendum)
Vs new primary lung CA with mediastinal LAD and pleural based masses which has enlarged by Feb 2021 CT.  Marland Kitchen  Workup deferred by patient and family .  Needs to have goals of care discussion with PCP and Palliative Care consult

## 2019-08-03 NOTE — Assessment & Plan Note (Signed)
Given her symptoms of reflux,  Will start Protonix empirically for gastritis   , may need to image brain if nausea is not improved with treatment of gastritis

## 2019-08-13 NOTE — Progress Notes (Signed)
Patient scheduled for follow-up 09/14/19.

## 2019-08-17 ENCOUNTER — Encounter: Payer: Self-pay | Admitting: Oncology

## 2019-08-17 ENCOUNTER — Other Ambulatory Visit: Payer: Self-pay

## 2019-08-17 ENCOUNTER — Inpatient Hospital Stay: Payer: Medicare Other | Attending: Oncology

## 2019-08-17 ENCOUNTER — Inpatient Hospital Stay (HOSPITAL_BASED_OUTPATIENT_CLINIC_OR_DEPARTMENT_OTHER): Payer: Medicare Other | Admitting: Oncology

## 2019-08-17 VITALS — BP 173/63 | HR 80 | Temp 97.5°F | Resp 16

## 2019-08-17 DIAGNOSIS — C7802 Secondary malignant neoplasm of left lung: Secondary | ICD-10-CM | POA: Insufficient documentation

## 2019-08-17 DIAGNOSIS — D631 Anemia in chronic kidney disease: Secondary | ICD-10-CM

## 2019-08-17 DIAGNOSIS — Z79811 Long term (current) use of aromatase inhibitors: Secondary | ICD-10-CM

## 2019-08-17 DIAGNOSIS — Z794 Long term (current) use of insulin: Secondary | ICD-10-CM | POA: Insufficient documentation

## 2019-08-17 DIAGNOSIS — Z89612 Acquired absence of left leg above knee: Secondary | ICD-10-CM | POA: Diagnosis not present

## 2019-08-17 DIAGNOSIS — K5900 Constipation, unspecified: Secondary | ICD-10-CM | POA: Diagnosis not present

## 2019-08-17 DIAGNOSIS — E1122 Type 2 diabetes mellitus with diabetic chronic kidney disease: Secondary | ICD-10-CM | POA: Insufficient documentation

## 2019-08-17 DIAGNOSIS — C50919 Malignant neoplasm of unspecified site of unspecified female breast: Secondary | ICD-10-CM | POA: Diagnosis not present

## 2019-08-17 DIAGNOSIS — N1831 Chronic kidney disease, stage 3a: Secondary | ICD-10-CM | POA: Diagnosis not present

## 2019-08-17 DIAGNOSIS — Z803 Family history of malignant neoplasm of breast: Secondary | ICD-10-CM | POA: Diagnosis not present

## 2019-08-17 DIAGNOSIS — C7801 Secondary malignant neoplasm of right lung: Secondary | ICD-10-CM | POA: Diagnosis not present

## 2019-08-17 LAB — CBC WITH DIFFERENTIAL/PLATELET
Abs Immature Granulocytes: 0.02 10*3/uL (ref 0.00–0.07)
Basophils Absolute: 0.1 10*3/uL (ref 0.0–0.1)
Basophils Relative: 1 %
Eosinophils Absolute: 0.1 10*3/uL (ref 0.0–0.5)
Eosinophils Relative: 1 %
HCT: 29.3 % — ABNORMAL LOW (ref 36.0–46.0)
Hemoglobin: 9.3 g/dL — ABNORMAL LOW (ref 12.0–15.0)
Immature Granulocytes: 0 %
Lymphocytes Relative: 12 %
Lymphs Abs: 0.8 10*3/uL (ref 0.7–4.0)
MCH: 29 pg (ref 26.0–34.0)
MCHC: 31.7 g/dL (ref 30.0–36.0)
MCV: 91.3 fL (ref 80.0–100.0)
Monocytes Absolute: 0.4 10*3/uL (ref 0.1–1.0)
Monocytes Relative: 6 %
Neutro Abs: 4.8 10*3/uL (ref 1.7–7.7)
Neutrophils Relative %: 80 %
Platelets: 319 10*3/uL (ref 150–400)
RBC: 3.21 MIL/uL — ABNORMAL LOW (ref 3.87–5.11)
RDW: 14.6 % (ref 11.5–15.5)
WBC: 6.1 10*3/uL (ref 4.0–10.5)
nRBC: 0 % (ref 0.0–0.2)

## 2019-08-17 LAB — COMPREHENSIVE METABOLIC PANEL
ALT: 26 U/L (ref 0–44)
AST: 39 U/L (ref 15–41)
Albumin: 3.4 g/dL — ABNORMAL LOW (ref 3.5–5.0)
Alkaline Phosphatase: 69 U/L (ref 38–126)
Anion gap: 11 (ref 5–15)
BUN: 30 mg/dL — ABNORMAL HIGH (ref 8–23)
CO2: 19 mmol/L — ABNORMAL LOW (ref 22–32)
Calcium: 9.2 mg/dL (ref 8.9–10.3)
Chloride: 103 mmol/L (ref 98–111)
Creatinine, Ser: 1.14 mg/dL — ABNORMAL HIGH (ref 0.44–1.00)
GFR calc Af Amer: 49 mL/min — ABNORMAL LOW (ref >60)
GFR calc non Af Amer: 43 mL/min — ABNORMAL LOW (ref >60)
Glucose, Bld: 183 mg/dL — ABNORMAL HIGH (ref 70–99)
Potassium: 3.8 mmol/L (ref 3.5–5.1)
Sodium: 133 mmol/L — ABNORMAL LOW (ref 135–145)
Total Bilirubin: 0.5 mg/dL (ref 0.3–1.2)
Total Protein: 6.8 g/dL (ref 6.5–8.1)

## 2019-08-17 MED ORDER — FERROUS SULFATE 325 (65 FE) MG PO TBEC: 325.0000 mg | DELAYED_RELEASE_TABLET | Freq: Two times a day (BID) | ORAL | 1 refills | Status: DC

## 2019-08-17 NOTE — Progress Notes (Signed)
Patient is having left breast area itching and occasional pain.  The pain is worse at night.

## 2019-08-18 DIAGNOSIS — N1831 Chronic kidney disease, stage 3a: Secondary | ICD-10-CM | POA: Insufficient documentation

## 2019-08-18 DIAGNOSIS — E1122 Type 2 diabetes mellitus with diabetic chronic kidney disease: Secondary | ICD-10-CM | POA: Diagnosis not present

## 2019-08-18 DIAGNOSIS — Z794 Long term (current) use of insulin: Secondary | ICD-10-CM | POA: Diagnosis not present

## 2019-08-18 DIAGNOSIS — Z79811 Long term (current) use of aromatase inhibitors: Secondary | ICD-10-CM | POA: Insufficient documentation

## 2019-08-18 DIAGNOSIS — E782 Mixed hyperlipidemia: Secondary | ICD-10-CM | POA: Diagnosis not present

## 2019-08-18 DIAGNOSIS — E1165 Type 2 diabetes mellitus with hyperglycemia: Secondary | ICD-10-CM | POA: Diagnosis not present

## 2019-08-18 DIAGNOSIS — I129 Hypertensive chronic kidney disease with stage 1 through stage 4 chronic kidney disease, or unspecified chronic kidney disease: Secondary | ICD-10-CM | POA: Diagnosis not present

## 2019-08-18 DIAGNOSIS — D631 Anemia in chronic kidney disease: Secondary | ICD-10-CM | POA: Insufficient documentation

## 2019-08-18 NOTE — Progress Notes (Signed)
Hematology/Oncology  Follow up note [facility] Telephone:([phone]) [phone] Fax:([phone]) [phone]   Patient Care Team: [name], FNP as PCP - General (Family Medicine) Rockey Situ [name], MD as PCP - Cardiology (Cardiology)  REFERRING PROVIDER: [name], FNP  CHIEF COMPLAINTS/REASON FOR VISIT:  Follow up for breast cancer  HISTORY OF PRESENTING ILLNESS:   Tiffany Bailey is a  86 y.o.  female with PMH listed below was seen in consultation at the request of  [name], FNP  for evaluation of abnormal CT scan. Patient was recently evaluated by primary care provider for complaints of urinary retention, headache, constipation. 02/03/2019 x-ray of abdomen showed large colonic stool volume, nodular densities noted in the right mid lung are up). 02/08/2019 subsequent chest x-ray two-view showed hyperinflation of the lungs compatible with COPD.  Multiple nodular densities project over the mid and lower right lung. CT chest was obtained for further evaluation. 02/17/2019 CT chest without contrast Showed multiple bilateral pulmonary nodules,groundglass opacities. Largest solid nodule at the confluence of the right major and minor fissures measuring 1.6 cm.  Most notable groundglass nodule is of the right upper lobe and measure 1.8 x 0.8 cm, small right pleural effusion with associated atelectasis or consolidation.  Larger solid nodule are highly suspicious for metastatic disease. Bulky right mediastinal and hilar lymph node.  Largest pretracheal nodes measuring 3.5 x 3 cm.  There are additional bulky superior mediastinal, supraclavicular, left subpectoral lymph nodes.  Largest subpectoral nodes measuring 3.3 x 2.5 cm.  Patient was sent to cancer center for further evaluation.  #Patient reports a history of left breast cancer status post mastectomy.  She is a poor historian.  She is not able to recall the timeframe of breast cancer diagnosis and then treatments.   Denies any chemotherapy or radiation treatments.  #Left lower extremity history of amputation, CKD, History of MI, Sickle cell disease, DM, HTN, anemia.   Patient denies shortness of breath, chest pain, hemoptysis.  Night sweating, abdominal pain. Reports intentional weight loss.  Appetite is poor.  Not eating much. Also have wax and wane, chronic intermittent headache, she takes Tylenol as needed, with some relief.  Denies any nausea vomiting. She sees neurology. 11/10/2018 CT head showed no acute intracranial abnormality or significant interval changes.  Stable atrophy and white matter disease.  Atherosclerosis.  #Patient had a consultation visit with me on 02/19/2019.  At that time he was referred for abnormal CT scan.  PET scan was obtained and I recommend patient to obtain left axillary lymph node biopsy.  Patient/family called back and decided not to proceed with any additional work-up.  #February 2020 started Arimidex INTERVAL HISTORY Tiffany Bailey is a 86 y.o. female who has above history reviewed by me today presents for follow up visit for management of metastatic breast cancer. Problems and complaints are listed below: Patient is on Arimidex and so far tolerates well. Continue to have chronic left breast area intermittent itching and occasional pain.  Pain is worse at night.  Denies any shortness of breath, abdominal pain.  Constipation has improved after taking stool softener.  Review of Systems  Constitutional: Positive for fatigue. Negative for appetite change, chills, fever and unexpected weight change.  HENT:   Negative for hearing loss and voice change.   Eyes: Negative for eye problems.  Respiratory: Negative for chest tightness, cough and shortness of breath.   Cardiovascular: Negative for chest pain.       Intermittent left anterior  chest wall/axillary sharp pain  Gastrointestinal: Positive for constipation. Negative for abdominal distention, abdominal pain and blood in  stool.  Endocrine: Negative for hot flashes.  Genitourinary: Negative for difficulty urinating and frequency.   Musculoskeletal: Negative for arthralgias.       Left lower extremity history of amputation  Skin: Negative for itching and rash.  Neurological: Negative for extremity weakness.  Hematological: Negative for adenopathy.  Psychiatric/Behavioral: Negative for confusion.    MEDICAL HISTORY:  Past Medical History:  Diagnosis Date  . Anemia   . Anginal pain ([place])   . Arthritis   . Breast cancer ([place]) 07/19/2019  . Cancer (HCC)    BREAST  . CKD (chronic kidney disease), stage III   . Coronary artery disease    S/P CABG and multiple PCI's  . Diabetes mellitus without complication ([place])   . Edema   . Failure to thrive in adult   . GERD (gastroesophageal reflux disease)   . Hb-SS disease without crisis ([place]) 10/23/2017  . History of breast cancer   . HOH (hard of hearing)   . Hyperlipidemia   . Hypertension   . Myocardial infarction ([place])   . Palpitations     SURGICAL HISTORY: Past Surgical History:  Procedure Laterality Date  . ABDOMINAL HYSTERECTOMY    . ABOVE KNEE LEG AMPUTATION Left 2013  . ANTERIOR VITRECTOMY Left 11/16/2015   Procedure: ANTERIOR VITRECTOMY;  Surgeon: Eulogio Bear, MD;  Location: [facility];  Service: Ophthalmology;  Laterality: Left;  . BLADDER SURGERY    . CATARACT EXTRACTION W/PHACO Left 11/16/2015   Procedure: CATARACT EXTRACTION PHACO AND INTRAOCULAR LENS PLACEMENT (IOC);  Surgeon: Eulogio Bear, MD;  Location: [facility];  Service: Ophthalmology;  Laterality: Left;  Lot # [id number] H [place]; 01:22.8 AP%:12.6 CDE: 10.46  . CATARACT EXTRACTION W/PHACO Right 08/22/2016   Procedure: CATARACT EXTRACTION PHACO AND INTRAOCULAR LENS PLACEMENT (IOC);  Surgeon: Eulogio Bear, MD;  Location: [facility];  Service: Ophthalmology;  Laterality: Right;  Lot # [id number] H [place]: 00:52.1 AP%:8.7 CDE: 4.99   . CORONARY ANGIOPLASTY     STENT  . CORONARY ARTERY BYPASS  GRAFT    . LOWER EXTREMITY ANGIOGRAPHY Right 01/14/2019   Procedure: LOWER EXTREMITY ANGIOGRAPHY;  Surgeon: Algernon Huxley, MD;  Location: [facility];  Service: Cardiovascular;  Laterality: Right;  . MASTECTOMY    . TUMOR REMOVAL     ABDOMINAL    SOCIAL HISTORY: Social History   Socioeconomic History  . Marital status: Divorced    Spouse name: Not on file  . Number of children: 7  . Years of education: Not on file  . Highest education level: Not on file  Occupational History  . 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 Smoking status: Never Smoker  . Smokeless tobacco: Never Used  Substance and Sexual Activity  . Alcohol use: No  . Drug use: No  . Sexual activity: Not Currently  Other Topics Concern  . Not on file  Social History Narrative  . Not on file   Social Determinants of Health   Financial Resource Strain: Low Risk   . Difficulty of Paying Living Expenses: Not hard at all  Food Insecurity: No Food Insecurity  . Worried About Charity fundraiser in the Last Year: Never true  . Ran Out of Food in the Last Year: Never true  Transportation Needs: No Transportation Needs  . Lack of Transportation (Medical): No  . Lack  of Transportation (Non-Medical): No  Physical Activity: Insufficiently Active  . Days of Exercise per Week: 7 days  . Minutes of Exercise per Session: 20 min  Stress: No Stress Concern Present  . Feeling of Stress : Only a little  Social Connections: Unknown  . Frequency of Communication with Friends and Family: More than three times a week  . Frequency of Social Gatherings with Friends and Family: Not on file  . Attends Religious Services: Not on file  . Active Member of Clubs or Organizations: Not on file  . Attends Archivist Meetings: Not on file  . Marital Status: Not on file  Intimate Partner Violence: Not At Risk  . Fear of Current or Ex-Partner: No  . Emotionally Abused: No  . Physically Abused: No   . Sexually Abused: No    FAMILY HISTORY: Family History  Problem Relation Age of Onset  . Sudden death Mother   . Arthritis Father   . Stroke Father   . Breast cancer Sister   . Diabetes Grandchild     ALLERGIES:  is allergic to amoxicillin; gabapentin; mirtazapine; and other.  MEDICATIONS:  Current Outpatient Medications  Medication Sig Dispense Refill  . acetaminophen (TYLENOL) 500 MG tablet Take 1 tablet (500 mg total) by mouth every 6 (six) hours as needed. 30 tablet 0  . amLODipine (NORVASC) 10 MG tablet Take 1 tablet (10 mg total) by mouth daily. 90 tablet 3  . anastrozole (ARIMIDEX) 1 MG tablet Take 1 tablet (1 mg total) by mouth daily. 90 tablet 0  . aspirin EC 81 MG tablet Take 1 tablet (81 mg total) by mouth daily. 30 tablet 5  . atorvastatin (LIPITOR) 40 MG tablet TAKE 1 TABLET BY MOUTH  DAILY 90 tablet 3  . blood glucose meter kit and supplies KIT Dispense based on patient and insurance preference. Use up to four times daily as directed. (FOR ICD-9 250.00, 250.01). 1 each 0  . clopidogrel (PLAVIX) 75 MG tablet Take 1 tablet (75 mg total) by mouth daily. 90 tablet 3  . cyclobenzaprine (FLEXERIL) 5 MG tablet Take 1 tablet (5 mg total) by mouth at bedtime as needed for muscle spasms. 30 tablet 0  . fluticasone (FLONASE) 50 MCG/ACT nasal spray Place 1 spray into both nostrils daily. 16 g 5  . glucose blood (ONE TOUCH TEST STRIPS) test strip Use to test blood sugar twice daily 200 each 3  . hydrALAZINE (APRESOLINE) 10 MG tablet Take 10 mg by mouth 2 (two) times daily.    . Insulin Glargine (BASAGLAR KWIKPEN) 100 UNIT/ML SOPN Inject 0.06 mLs (6 Units total) into the skin daily. [facility] as of  01/21/2019    . Insulin Pen Needle (BD PEN NEEDLE NANO U/F) 32G X 4 MM MISC USE 1 DAILY 100 each 12  . metFORMIN (GLUCOPHAGE) 500 MG tablet Take 500 mg by mouth daily with breakfast.     . metoprolol tartrate (LOPRESSOR) 25 MG tablet TAKE 1 TABLET BY MOUTH TWO  TIMES DAILY 180 tablet 3   . Multiple Vitamin (MULTIVITAMIN) tablet Take 1 tablet by mouth daily.    . mupirocin ointment (BACTROBAN) 2 % Place 1 application into the nose 2 (two) times daily. 22 g 0  . nitroGLYCERIN (NITROSTAT) 0.4 MG SL tablet Place 1 tablet (0.4 mg total) under the tongue every 5 (five) minutes as needed for chest pain. 25 tablet 4  . ondansetron (ZOFRAN) 4 MG tablet Take 1 tablet (4 mg total) by mouth every  8 (eight) hours as needed for nausea or vomiting. 30 tablet 1  . ONE TOUCH LANCETS MISC Use as directed 2 times per day 200 each 3  . pantoprazole (PROTONIX) 40 MG tablet Take 1 tablet (40 mg total) by mouth at bedtime. 30 tablet 3  . senna (SENOKOT) 8.6 MG TABS tablet Take 2 tablets (17.2 mg total) by mouth daily. Hold if diarrhea. 30 tablet 1  . ferrous sulfate 325 (65 FE) MG EC tablet Take 1 tablet (325 mg total) by mouth 2 (two) times daily with a meal. 60 tablet 1   No current facility-administered medications for this visit.     PHYSICAL EXAMINATION: ECOG PERFORMANCE STATUS: 2 - Symptomatic, <50% confined to bed Vitals:   08/17/19 1314  BP: (!) 173/63  Pulse: 80  Resp: 16  Temp: (!) 97.5 F (36.4 C)   There were no vitals filed for this visit.  Physical Exam Constitutional:      General: She is not in acute distress.    Comments: She sits in the wheel chair.  Thin  built  HENT:     Head: Normocephalic and atraumatic.  Eyes:     General: No scleral icterus.    Pupils: Pupils are equal, round, and reactive to light.  Cardiovascular:     Rate and Rhythm: Normal rate and regular rhythm.     Heart sounds: Normal heart sounds.  Pulmonary:     Effort: Pulmonary effort is normal. No respiratory distress.     Breath sounds: No wheezing.     Comments: Severely diminished breath sounds bilaterally.   Abdominal:     General: Bowel sounds are normal. There is no distension.     Palpations: Abdomen is soft.  Musculoskeletal:        General: No deformity. Normal range of motion.       Cervical back: Normal range of motion and neck supple.     Comments: Left lower extremity history of amputation.   Skin:    General: Skin is warm and dry.     Findings: No erythema or rash.  Neurological:     Mental Status: She is alert and oriented to person, place, and time. Mental status is at baseline.     Cranial Nerves: No cranial nerve deficit.     Coordination: Coordination normal.  Psychiatric:        Mood and Affect: Mood normal.        Behavior: Behavior normal.        Thought Content: Thought content normal.      LABORATORY DATA:  I have reviewed the data as listed Lab Results  Component Value Date   WBC 6.1 08/17/2019   HGB 9.3 (L) 08/17/2019   HCT 29.3 (L) 08/17/2019   MCV 91.3 08/17/2019   PLT 319 08/17/2019   Recent Labs    06/24/19 0942 08/02/19 1040 08/17/19 1256  NA 137 131* 133*  K 3.6 4.3 3.8  CL 104 97* 103  CO2 22 21* 19*  GLUCOSE 77 230* 183*  BUN 18 19 30*  CREATININE 0.91 1.12* 1.14*  CALCIUM 9.5 9.3 9.2  GFRNONAA 56* 44* 43*  GFRAA >60 50* 49*  PROT 7.3 7.0 6.8  ALBUMIN 4.0 3.3* 3.4*  AST 30 30 39  ALT _0 ALKPHOS 84 88 69  BILITOT 0.6 0.4 0.5   Iron/TIBC/Ferritin/ %Sat    Component Value Date/Time   IRON 32 08/02/2019 1040   TIBC 223 (L) 08/02/2019 1040  FERRITIN 137 08/02/2019 1040   IRONPCTSAT 14 08/02/2019 1040   IRONPCTSAT 27 06/19/2018 1006      RADIOGRAPHIC STUDIES: I have personally reviewed the radiological images as listed and agreed with the findings in the report.  CT Chest Wo Contrast  Result Date: 07/26/2019 CLINICAL DATA:  Breast cancer.  Lung nodules. EXAM: CT CHEST WITHOUT CONTRAST TECHNIQUE: Multidetector CT imaging of the chest was performed following the standard protocol without IV contrast. COMPARISON:  PET 02/25/2019 in CT chest 02/17/2019. FINDINGS: Cardiovascular: Atherosclerotic calcification of the aorta and aortic valve. Heart size normal. No pericardial effusion. Mediastinum/Nodes: High  right periesophageal lymph node measures 10 mm (2/16), stable. Mediastinal adenopathy measures up to approximately 2.1 cm in short axis in the low right paratracheal station, similar. Partially calcified subcarinal lymph node measures 1.9 cm, stable. Hilar regions are difficult to evaluate without IV contrast. Left subpectoral and axillary adenopathy measures up to 2.2 cm (2/32), stable. No right axillary adenopathy. Esophagus is grossly unremarkable. Lungs/Pleura: Right pleural effusion is now moderate in size with associated compressive atelectasis in the right lower lobe. Right-sided pleural nodules and masses have enlarged significantly. Index pleural mass in the posterior lower right hemithorax measures 2.7 x 4.4 cm (2/99), compared to 1.2 x 1.9 cm on 02/17/2019. Enlarging ground-glass and solid nodules bilaterally. Index left lower lobe nodule measures 6 x 12 mm (4/107), previously 4 x 9 mm on 02/17/2019. Mild centrilobular emphysema. Airway is unremarkable. Upper Abdomen: Visualized portion of the liver is unremarkable. Adrenal glands appear thickened, as before. Low-attenuation lesions in the right kidney measure up to 3.9 cm, incompletely imaged. Stones in the kidneys bilaterally. Visualized portions of the spleen pancreas, stomach and bowel are unremarkable with the exception of a small hiatal hernia. Musculoskeletal: Degenerative changes in the spine. Old left posterolateral rib fracture. No worrisome lytic or sclerotic lesions. Flowing anterior osteophytosis in the thoracic spine. IMPRESSION: 1. Progression of pleuroparenchymal metastatic disease as evidenced by enlargement in the right pleural effusion, right pleural nodules/masses and bilateral pulmonary nodules. 2. Bulky mediastinal and left subpectoral/axillary, stable. 3. Bilateral renal stones. 4.  Aortic atherosclerosis (ICD10-I70.0). 5.  Emphysema (ICD10-J43.9). Electronically Signed   By: Lorin Picket M.D.   On: 07/26/2019 08:13       ASSESSMENT & PLAN:  1. Metastatic breast cancer ([place])   2. Anemia due to stage 3a chronic kidney disease   3. Use of anastrozole (Arimidex)    #Metastatic breast cancer, Pathology was reviewed and discussed with patient.  Adenocarcinoma with breast origin. stage IV disease, which is not curable.  Goal of care: Palliative. She tolerates Arimidex well.  Labs are reviewed and discussed with patient.  Continue.  Chronic anemia secondary to CKD. Hemoglobin stable at 9.3.  Patient to continue oral iron supplementation.  Constipation, continue Senokot.  Symptom has improved.  Chronic use of Arimidex.  Recommend patient to continue calcium and vitamin D supplementation.    Orders Placed This Encounter  Procedures  . CBC with Differential/Platelet    Standing Status:   Future    Standing Expiration Date:   08/16/2020  . Comprehensive metabolic panel    Standing Status:   Future    Standing Expiration Date:   08/16/2020  . Iron and TIBC    Standing Status:   Future    Standing Expiration Date:   08/16/2020  . Ferritin    Standing Status:   Future    Standing Expiration Date:   08/16/2020    All questions were  answered. The patient knows to call the clinic with any problems questions or concerns. Return of visit:  4 weeks.   [name], MD, PhD Hematology Oncology [facility] at [facility] Pager- [phone] 08/18/2019

## 2019-08-23 ENCOUNTER — Telehealth: Payer: Self-pay

## 2019-08-23 NOTE — Telephone Encounter (Signed)
Nutrition Assessment:  Patient identified on Malnutrition Screening report for weight loss and poor appetite.   86 year old female with metastatic breast cancer currently on anastrozole.  Past medical history of CKD, MI, sickle cell, DM.    Spoke with patient via phone to introduce self and service at [facility].  Patient reports that her appetite has picked up over the last 3-4 days.  Reports that she ate bacon, biscuits, egg and few bites of cream of wheat this am.  Getting ready to eat pepper steak for lunch.  Had chicken and vegetables yesterday.  Reports that she cooks or gets take out for meals.  Reports that she drinks ensure/boost (diabetic kind) sometimes.  Reports constipation but it has resolved, thinks that is what was effecting appetite.      Medications: reviewed  Labs: reviewed  Anthropometrics:   Height: 60 inches Weight: 80 lb Noted 88 lb in 02/2019 BMI: 15   Estimated Energy Needs  Kcals: 1260 Protein: 63 g Fluid: > 1.2 L  NUTRITION DIAGNOSIS: Inadequate oral intake related to cancer related treatment side effects as evidenced by weight loss, BMI 15   INTERVENTION:  Encouraged small frequent meals Discussed ways to add calories and protein in current eating pattern.  Will mail handout Encouraged oral nutrition supplements for additional calories Patient was given RD's contact information and encouraged to call with questions or concerns    NEXT VISIT: no follow-up, patient to call RD   Laterria Lasota B. Zenia Resides, [place], [place] Registered Dietitian [phone] (pager)

## 2019-09-03 DIAGNOSIS — Z139 Encounter for screening, unspecified: Secondary | ICD-10-CM | POA: Diagnosis not present

## 2019-09-13 ENCOUNTER — Inpatient Hospital Stay: Payer: Medicare Other

## 2019-09-13 DIAGNOSIS — N1831 Chronic kidney disease, stage 3a: Secondary | ICD-10-CM | POA: Diagnosis not present

## 2019-09-13 DIAGNOSIS — Z89612 Acquired absence of left leg above knee: Secondary | ICD-10-CM | POA: Diagnosis not present

## 2019-09-13 DIAGNOSIS — E1122 Type 2 diabetes mellitus with diabetic chronic kidney disease: Secondary | ICD-10-CM | POA: Diagnosis not present

## 2019-09-13 DIAGNOSIS — Z794 Long term (current) use of insulin: Secondary | ICD-10-CM | POA: Diagnosis not present

## 2019-09-13 DIAGNOSIS — D631 Anemia in chronic kidney disease: Secondary | ICD-10-CM | POA: Diagnosis not present

## 2019-09-13 DIAGNOSIS — Z79811 Long term (current) use of aromatase inhibitors: Secondary | ICD-10-CM | POA: Diagnosis not present

## 2019-09-13 DIAGNOSIS — C50919 Malignant neoplasm of unspecified site of unspecified female breast: Secondary | ICD-10-CM | POA: Diagnosis not present

## 2019-09-13 DIAGNOSIS — C7801 Secondary malignant neoplasm of right lung: Secondary | ICD-10-CM | POA: Diagnosis not present

## 2019-09-13 DIAGNOSIS — Z803 Family history of malignant neoplasm of breast: Secondary | ICD-10-CM | POA: Diagnosis not present

## 2019-09-13 DIAGNOSIS — C7802 Secondary malignant neoplasm of left lung: Secondary | ICD-10-CM | POA: Diagnosis not present

## 2019-09-13 DIAGNOSIS — K5900 Constipation, unspecified: Secondary | ICD-10-CM | POA: Diagnosis not present

## 2019-09-13 LAB — COMPREHENSIVE METABOLIC PANEL
ALT: 19 U/L (ref 0–44)
AST: 23 U/L (ref 15–41)
Albumin: 3.4 g/dL — ABNORMAL LOW (ref 3.5–5.0)
Alkaline Phosphatase: 64 U/L (ref 38–126)
Anion gap: 8 (ref 5–15)
BUN: 29 mg/dL — ABNORMAL HIGH (ref 8–23)
CO2: 20 mmol/L — ABNORMAL LOW (ref 22–32)
Calcium: 8.6 mg/dL — ABNORMAL LOW (ref 8.9–10.3)
Chloride: 107 mmol/L (ref 98–111)
Creatinine, Ser: 1.3 mg/dL — ABNORMAL HIGH (ref 0.44–1.00)
GFR calc Af Amer: 42 mL/min — ABNORMAL LOW (ref >60)
GFR calc non Af Amer: 36 mL/min — ABNORMAL LOW (ref >60)
Glucose, Bld: 220 mg/dL — ABNORMAL HIGH (ref 70–99)
Potassium: 3.8 mmol/L (ref 3.5–5.1)
Sodium: 135 mmol/L (ref 135–145)
Total Bilirubin: 0.5 mg/dL (ref 0.3–1.2)
Total Protein: 6.6 g/dL (ref 6.5–8.1)

## 2019-09-13 LAB — CBC WITH DIFFERENTIAL/PLATELET
Abs Immature Granulocytes: 0.02 10*3/uL (ref 0.00–0.07)
Basophils Absolute: 0 10*3/uL (ref 0.0–0.1)
Basophils Relative: 1 %
Eosinophils Absolute: 0.3 10*3/uL (ref 0.0–0.5)
Eosinophils Relative: 5 %
HCT: 30.6 % — ABNORMAL LOW (ref 36.0–46.0)
Hemoglobin: 10.1 g/dL — ABNORMAL LOW (ref 12.0–15.0)
Immature Granulocytes: 0 %
Lymphocytes Relative: 17 %
Lymphs Abs: 1.1 10*3/uL (ref 0.7–4.0)
MCH: 29.4 pg (ref 26.0–34.0)
MCHC: 33 g/dL (ref 30.0–36.0)
MCV: 89 fL (ref 80.0–100.0)
Monocytes Absolute: 0.5 10*3/uL (ref 0.1–1.0)
Monocytes Relative: 8 %
Neutro Abs: 4.3 10*3/uL (ref 1.7–7.7)
Neutrophils Relative %: 69 %
Platelets: 295 10*3/uL (ref 150–400)
RBC: 3.44 MIL/uL — ABNORMAL LOW (ref 3.87–5.11)
RDW: 15.9 % — ABNORMAL HIGH (ref 11.5–15.5)
WBC: 6.3 10*3/uL (ref 4.0–10.5)
nRBC: 0 % (ref 0.0–0.2)

## 2019-09-13 LAB — IRON AND TIBC
Iron: 68 ug/dL (ref 28–170)
Saturation Ratios: 26 % (ref 10.4–31.8)
TIBC: 260 ug/dL (ref 250–450)
UIBC: 192 ug/dL

## 2019-09-13 LAB — FERRITIN: Ferritin: 62 ng/mL (ref 11–307)

## 2019-09-14 ENCOUNTER — Telehealth: Payer: Self-pay | Admitting: Nurse Practitioner

## 2019-09-14 ENCOUNTER — Encounter: Payer: Self-pay | Admitting: Family

## 2019-09-14 ENCOUNTER — Telehealth (INDEPENDENT_AMBULATORY_CARE_PROVIDER_SITE_OTHER): Payer: Medicare Other | Admitting: Family

## 2019-09-14 DIAGNOSIS — C50919 Malignant neoplasm of unspecified site of unspecified female breast: Secondary | ICD-10-CM

## 2019-09-14 DIAGNOSIS — Z794 Long term (current) use of insulin: Secondary | ICD-10-CM | POA: Diagnosis not present

## 2019-09-14 DIAGNOSIS — I10 Essential (primary) hypertension: Secondary | ICD-10-CM

## 2019-09-14 DIAGNOSIS — E1159 Type 2 diabetes mellitus with other circulatory complications: Secondary | ICD-10-CM

## 2019-09-14 NOTE — Assessment & Plan Note (Signed)
Stable, however not checking fasting glucose at this time.  She had stopped Metformin which I agree with based on renal function.  She continues to following with endocrine.

## 2019-09-14 NOTE — Assessment & Plan Note (Signed)
Stable, controlled.  Continue current regimen

## 2019-09-14 NOTE — Progress Notes (Signed)
Verbal consent for services obtained from patient prior to services given to TELEPHONE visit:   Location of call:  provider at work patient at home  Names of all persons present for services: [name], NP and patient Chief complaint:   HTN- recently 140/50  CKD  Metastatic breast cancer-follows with Dr. Rhetta Mura is palliative. On arimidex  History, background, results pertinent:   DM- follows with Malissa Hippo; Basaglar 6 units, Metformin 500 mg daily A/P/next steps:   I spent 15 min  discussing plan of care over the phone.

## 2019-09-14 NOTE — Assessment & Plan Note (Signed)
Discussed with patient the nature of her diagnosis and it is not curative.  Patient is aware this.  We jointly agreed based on her constellation of symptoms including fatigue, pain control and multiple doctors appointments.  We will consult palliative care to optimize quality of life. Referral placed.  Patient will follow up with me in the office

## 2019-09-14 NOTE — Progress Notes (Signed)
Verbal consent for services obtained from patient prior to services given to TELEPHONE visit:   Location of call:  provider at work patient at home  Names of all persons present for services: Tiffany Paris, NP and patient Chief complaint:   Follow up Discuss plan of care.  Son, grandson live with her. Hasnt driven in some time. Feels safe driving. Able to prepare meals, do house chores.  Understands that breast cancer is not curative. She hope that arimidex will slow it down. Had home health ( not sure how it started) that would help with chores around the house.   Endorses fatigue.  Eating more lately and no recent hypoglycemic episodes. Appetite has improved. 'eats all the time.' Weight is stable. Has stopped checking blood sugar every day. Limiting soda, dessert.    Complains of bilateral chest wall 'itching mostly' under both breasts. Occasionally will have pain describes as moderate ache. Worse under left breast. Primary cancer in left breast. Endorses night sweats. No CP, left arm numbness, palpitations.   Metastatic breast cancer-follows with Dr. Rhetta Mura is palliative. On arimidex  PAD-on lipitor and plavix.  Following with vascular, last seen 06/2019. Right sided angioplasty and stenting.   HTN- recently 140/50. Compliant with medication.   CKD- Crt is 42. She is off metformin.        DM- follows with Malissa Hippo; Basaglar 6 units; she has STOPPED Metformin 500 mg daily. Filed Weights   09/14/19 1120  Weight: 84 lb (38.1 kg)    A/P/next steps:  Problem List Items Addressed This Visit      Cardiovascular and Mediastinum   Essential hypertension    Stable, controlled.  Continue current regimen        Endocrine   DM (diabetes mellitus) ([place])    Stable, however not checking fasting glucose at this time.  She had stopped Metformin which I agree with based on renal function.  She continues to following with endocrine.        Other   Metastatic breast  cancer [facility])    Discussed with patient the nature of her diagnosis and it is not curative.  Patient is aware this.  We jointly agreed based on her constellation of symptoms including fatigue, pain control and multiple doctors appointments.  We will consult palliative care to optimize quality of life. Referral placed.  Patient will follow up with me in the office         I spent 21 min  discussing plan of care over the phone.

## 2019-09-15 ENCOUNTER — Other Ambulatory Visit: Payer: Self-pay

## 2019-09-15 ENCOUNTER — Inpatient Hospital Stay (HOSPITAL_BASED_OUTPATIENT_CLINIC_OR_DEPARTMENT_OTHER): Payer: Medicare Other | Admitting: Oncology

## 2019-09-15 ENCOUNTER — Encounter: Payer: Self-pay | Admitting: Oncology

## 2019-09-15 VITALS — BP 158/80 | HR 73 | Temp 97.2°F | Resp 16

## 2019-09-15 DIAGNOSIS — C7801 Secondary malignant neoplasm of right lung: Secondary | ICD-10-CM | POA: Diagnosis not present

## 2019-09-15 DIAGNOSIS — K5900 Constipation, unspecified: Secondary | ICD-10-CM | POA: Diagnosis not present

## 2019-09-15 DIAGNOSIS — Z79811 Long term (current) use of aromatase inhibitors: Secondary | ICD-10-CM | POA: Diagnosis not present

## 2019-09-15 DIAGNOSIS — Z780 Asymptomatic menopausal state: Secondary | ICD-10-CM | POA: Diagnosis not present

## 2019-09-15 DIAGNOSIS — C7802 Secondary malignant neoplasm of left lung: Secondary | ICD-10-CM | POA: Diagnosis not present

## 2019-09-15 DIAGNOSIS — Z89612 Acquired absence of left leg above knee: Secondary | ICD-10-CM | POA: Diagnosis not present

## 2019-09-15 DIAGNOSIS — Z803 Family history of malignant neoplasm of breast: Secondary | ICD-10-CM | POA: Diagnosis not present

## 2019-09-15 DIAGNOSIS — N1831 Chronic kidney disease, stage 3a: Secondary | ICD-10-CM | POA: Diagnosis not present

## 2019-09-15 DIAGNOSIS — C50919 Malignant neoplasm of unspecified site of unspecified female breast: Secondary | ICD-10-CM

## 2019-09-15 DIAGNOSIS — D631 Anemia in chronic kidney disease: Secondary | ICD-10-CM | POA: Diagnosis not present

## 2019-09-15 DIAGNOSIS — E1122 Type 2 diabetes mellitus with diabetic chronic kidney disease: Secondary | ICD-10-CM | POA: Diagnosis not present

## 2019-09-15 DIAGNOSIS — Z794 Long term (current) use of insulin: Secondary | ICD-10-CM | POA: Diagnosis not present

## 2019-09-15 NOTE — Progress Notes (Signed)
Hematology/Oncology  Follow up note [facility] [phone] [phone]   Patient Care Team: [name], FNP as PCP - General (Family Medicine) Rockey Situ [name], MD as PCP - Cardiology (Cardiology)  REFERRING PROVIDER: [name], FNP  CHIEF COMPLAINTS/REASON FOR VISIT:  Follow up for breast cancer  HISTORY OF PRESENTING ILLNESS:   Tiffany Bailey is a  86 y.o.  female with PMH listed below was seen in consultation at the request of  [name], FNP  for evaluation of abnormal CT scan. Patient was recently evaluated by primary care provider for complaints of urinary retention, headache, constipation. 02/03/2019 x-ray of abdomen showed large colonic stool volume, nodular densities noted in the right mid lung are up). 02/08/2019 subsequent chest x-ray two-view showed hyperinflation of the lungs compatible with COPD.  Multiple nodular densities project over the mid and lower right lung. CT chest was obtained for further evaluation. 02/17/2019 CT chest without contrast Showed multiple bilateral pulmonary nodules,groundglass opacities. Largest solid nodule at the confluence of the right major and minor fissures measuring 1.6 cm.  Most notable groundglass nodule is of the right upper lobe and measure 1.8 x 0.8 cm, small right pleural effusion with associated atelectasis or consolidation.  Larger solid nodule are highly suspicious for metastatic disease. Bulky right mediastinal and hilar lymph node.  Largest pretracheal nodes measuring 3.5 x 3 cm.  There are additional bulky superior mediastinal, supraclavicular, left subpectoral lymph nodes.  Largest subpectoral nodes measuring 3.3 x 2.5 cm.  Patient was sent to cancer center for further evaluation.  #Patient reports a history of left breast cancer status post mastectomy.  She is a poor historian.  She is not able to recall the timeframe of breast cancer diagnosis and then treatments.   Denies any chemotherapy or radiation treatments.  #Left lower extremity history of amputation, CKD, History of MI, Sickle cell disease, DM, HTN, anemia.   Patient denies shortness of breath, chest pain, hemoptysis.  Night sweating, abdominal pain. Reports intentional weight loss.  Appetite is poor.  Not eating much. Also have wax and wane, chronic intermittent headache, she takes Tylenol as needed, with some relief.  Denies any nausea vomiting. She sees neurology. 11/10/2018 CT head showed no acute intracranial abnormality or significant interval changes.  Stable atrophy and white matter disease.  Atherosclerosis.  #Patient had a consultation visit with me on 02/19/2019.  At that time he was referred for abnormal CT scan.  PET scan was obtained and I recommend patient to obtain left axillary lymph node biopsy.  Patient/family called back and decided not to proceed with any additional work-up.  #February 2020 started Arimidex INTERVAL HISTORY Tiffany Bailey is a 86 y.o. female who has above history reviewed by me today presents for follow up visit for management of metastatic breast cancer. Problems and complaints are listed below: She reports feeling well.  On Arimidex and tolerates well.  Intermitted left axillary/breast pain.  No new complaints.    Review of Systems  Constitutional: Positive for fatigue. Negative for appetite change, chills, fever and unexpected weight change.  HENT:   Negative for hearing loss and voice change.   Eyes: Negative for eye problems.  Respiratory: Negative for chest tightness, cough and shortness of breath.   Cardiovascular: Negative for chest pain.       Intermittent left anterior chest wall/axillary sharp pain  Gastrointestinal: Negative for abdominal distention, abdominal pain, blood in stool and constipation.  Endocrine: Negative for hot  flashes.  Genitourinary: Negative for difficulty urinating and frequency.   Musculoskeletal: Negative for arthralgias.        Left lower extremity history of amputation  Skin: Negative for itching and rash.  Neurological: Negative for extremity weakness.  Hematological: Negative for adenopathy.  Psychiatric/Behavioral: Negative for confusion.    MEDICAL HISTORY:  Past Medical History:  Diagnosis Date  . Anemia   . Anginal pain ([place])   . Arthritis   . Breast cancer ([place]) 07/19/2019  . Cancer (HCC)    BREAST  . CKD (chronic kidney disease), stage III   . Coronary artery disease    S/P CABG and multiple PCI's  . Diabetes mellitus without complication ([place])   . Edema   . Failure to thrive in adult   . GERD (gastroesophageal reflux disease)   . Hb-SS disease without crisis ([place]) 10/23/2017  . History of breast cancer   . HOH (hard of hearing)   . Hyperlipidemia   . Hypertension   . Myocardial infarction ([place])   . Palpitations     SURGICAL HISTORY: Past Surgical History:  Procedure Laterality Date  . ABDOMINAL HYSTERECTOMY    . ABOVE KNEE LEG AMPUTATION Left 2013  . ANTERIOR VITRECTOMY Left 11/16/2015   Procedure: ANTERIOR VITRECTOMY;  Surgeon: Eulogio Bear, MD;  Location: [facility] ORS;  Service: Ophthalmology;  Laterality: Left;  . BLADDER SURGERY    . CATARACT EXTRACTION W/PHACO Left 11/16/2015   Procedure: CATARACT EXTRACTION PHACO AND INTRAOCULAR LENS PLACEMENT (IOC);  Surgeon: Eulogio Bear, MD;  Location: [facility] ORS;  Service: Ophthalmology;  Laterality: Left;  Lot # [id number] H [place]; 01:22.8 AP%:12.6 CDE: 10.46  . CATARACT EXTRACTION W/PHACO Right 08/22/2016   Procedure: CATARACT EXTRACTION PHACO AND INTRAOCULAR LENS PLACEMENT (IOC);  Surgeon: Eulogio Bear, MD;  Location: [facility] ORS;  Service: Ophthalmology;  Laterality: Right;  Lot # [id number] H [place]: 00:52.1 AP%:8.7 CDE: 4.99   . CORONARY ANGIOPLASTY     STENT  . CORONARY ARTERY BYPASS GRAFT    . LOWER EXTREMITY ANGIOGRAPHY Right 01/14/2019   Procedure: LOWER EXTREMITY ANGIOGRAPHY;  Surgeon: Algernon Huxley, MD;  Location: [facility];   Service: Cardiovascular;  Laterality: Right;  . MASTECTOMY    . TUMOR REMOVAL     ABDOMINAL    SOCIAL HISTORY: Social History   Socioeconomic History  . Marital status: Divorced    Spouse name: Not on file  . Number of children: 7  . Years of education: Not on file  . Highest education level: Not on file  Occupational History  . 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 Smoking status: Never Smoker  . Smokeless tobacco: Never Used  Substance and Sexual Activity  . Alcohol use: No  . Drug use: No  . Sexual activity: Not Currently  Other Topics Concern  . Not on file  Social History Narrative  . Not on file   Social Determinants of Health   Financial Resource Strain: Low Risk   . Difficulty of Paying Living Expenses: Not hard at all  Food Insecurity: No Food Insecurity  . Worried About Charity fundraiser in the Last Year: Never true  . Ran Out of Food in the Last Year: Never true  Transportation Needs: No Transportation Needs  . Lack of Transportation (Medical): No  . Lack of Transportation (Non-Medical): No  Physical Activity: Insufficiently Active  . Days of Exercise per Week: 7 days  . Minutes of Exercise per  Session: 20 min  Stress: No Stress Concern Present  . Feeling of Stress : Only a little  Social Connections: Unknown  . Frequency of Communication with Friends and Family: More than three times a week  . Frequency of Social Gatherings with Friends and Family: Not on file  . Attends Religious Services: Not on file  . Active Member of Clubs or Organizations: Not on file  . Attends Archivist Meetings: Not on file  . Marital Status: Not on file  Intimate Partner Violence: Not At Risk  . Fear of Current or Ex-Partner: No  . Emotionally Abused: No  . Physically Abused: No  . Sexually Abused: No    FAMILY HISTORY: Family History  Problem Relation Age of Onset  . Sudden death Mother   . Arthritis Father   . Stroke Father    . Breast cancer Sister   . Diabetes Grandchild     ALLERGIES:  is allergic to amoxicillin; gabapentin; mirtazapine; and other.  MEDICATIONS:  Current Outpatient Medications  Medication Sig Dispense Refill  . acetaminophen (TYLENOL) 500 MG tablet Take 1 tablet (500 mg total) by mouth every 6 (six) hours as needed. 30 tablet 0  . amLODipine (NORVASC) 10 MG tablet Take 1 tablet (10 mg total) by mouth daily. 90 tablet 3  . anastrozole (ARIMIDEX) 1 MG tablet Take 1 tablet (1 mg total) by mouth daily. 90 tablet 0  . aspirin EC 81 MG tablet Take 1 tablet (81 mg total) by mouth daily. 30 tablet 5  . atorvastatin (LIPITOR) 40 MG tablet TAKE 1 TABLET BY MOUTH  DAILY 90 tablet 3  . blood glucose meter kit and supplies KIT Dispense based on patient and insurance preference. Use up to four times daily as directed. (FOR ICD-9 250.00, 250.01). 1 each 0  . Chromium-Cinnamon (CINNAMON PLUS CHROMIUM) [phone] MCG-MG CAPS Take 2 capsules by mouth daily.    . clopidogrel (PLAVIX) 75 MG tablet Take 1 tablet (75 mg total) by mouth daily. 90 tablet 3  . cyclobenzaprine (FLEXERIL) 5 MG tablet Take 1 tablet (5 mg total) by mouth at bedtime as needed for muscle spasms. 30 tablet 0  . ferrous sulfate 325 (65 FE) MG EC tablet Take 1 tablet (325 mg total) by mouth 2 (two) times daily with a meal. 60 tablet 1  . fluticasone (FLONASE) 50 MCG/ACT nasal spray Place 1 spray into both nostrils daily. 16 g 5  . glucose blood (ONE TOUCH TEST STRIPS) test strip Use to test blood sugar twice daily 200 each 3  . hydrALAZINE (APRESOLINE) 10 MG tablet Take 10 mg by mouth 2 (two) times daily.    . Insulin Glargine (BASAGLAR KWIKPEN) 100 UNIT/ML SOPN Inject 0.06 mLs (6 Units total) into the skin daily. [facility] as of  01/21/2019    . Insulin Pen Needle (BD PEN NEEDLE NANO U/F) 32G X 4 MM MISC USE 1 DAILY 100 each 12  . metoprolol tartrate (LOPRESSOR) 25 MG tablet TAKE 1 TABLET BY MOUTH TWO  TIMES DAILY 180 tablet 3  . Multiple  Vitamin (MULTIVITAMIN) tablet Take 1 tablet by mouth daily.    . mupirocin ointment (BACTROBAN) 2 % Place 1 application into the nose 2 (two) times daily. 22 g 0  . nitroGLYCERIN (NITROSTAT) 0.4 MG SL tablet Place 1 tablet (0.4 mg total) under the tongue every 5 (five) minutes as needed for chest pain. 25 tablet 4  . ondansetron (ZOFRAN) 4 MG tablet Take 1 tablet (4 mg total)  by mouth every 8 (eight) hours as needed for nausea or vomiting. 30 tablet 1  . ONE TOUCH LANCETS MISC Use as directed 2 times per day 200 each 3  . pantoprazole (PROTONIX) 40 MG tablet Take 1 tablet (40 mg total) by mouth at bedtime. 30 tablet 3  . senna (SENOKOT) 8.6 MG TABS tablet Take 2 tablets (17.2 mg total) by mouth daily. Hold if diarrhea. 30 tablet 1  . vitamin B-12 (CYANOCOBALAMIN) 1000 MCG tablet Take 1,000 mcg by mouth daily.     No current facility-administered medications for this visit.     PHYSICAL EXAMINATION: ECOG PERFORMANCE STATUS: 2 - Symptomatic, <50% confined to bed Vitals:   09/15/19 1301  BP: (!) 158/80  Pulse: 73  Resp: 16  Temp: (!) 97.2 F (36.2 C)   There were no vitals filed for this visit.  Physical Exam Constitutional:      General: She is not in acute distress.    Comments: She sits in the wheel chair.  Thin  built  HENT:     Head: Normocephalic and atraumatic.  Eyes:     General: No scleral icterus.    Pupils: Pupils are equal, round, and reactive to light.  Cardiovascular:     Rate and Rhythm: Normal rate and regular rhythm.     Heart sounds: Normal heart sounds.  Pulmonary:     Effort: Pulmonary effort is normal. No respiratory distress.     Breath sounds: No wheezing.     Comments: Severely diminished breath sounds bilaterally.   Abdominal:     General: Bowel sounds are normal. There is no distension.     Palpations: Abdomen is soft.  Musculoskeletal:        General: No deformity. Normal range of motion.     Cervical back: Normal range of motion and neck  supple.     Comments: Left lower extremity history of amputation.   Skin:    General: Skin is warm and dry.     Findings: No erythema or rash.  Neurological:     Mental Status: She is alert and oriented to person, place, and time. Mental status is at baseline.     Cranial Nerves: No cranial nerve deficit.     Coordination: Coordination normal.  Psychiatric:        Mood and Affect: Mood normal.        Behavior: Behavior normal.        Thought Content: Thought content normal.      LABORATORY DATA:  I have reviewed the data as listed Lab Results  Component Value Date   WBC 6.3 09/13/2019   HGB 10.1 (L) 09/13/2019   HCT 30.6 (L) 09/13/2019   MCV 89.0 09/13/2019   PLT 295 09/13/2019   Recent Labs    08/02/19 1040 08/17/19 1256 09/13/19 1326  NA 131* 133* 135  K 4.3 3.8 3.8  CL 97* 103 107  CO2 21* 19* 20*  GLUCOSE 230* 183* 220*  BUN 19 30* 29*  CREATININE 1.12* 1.14* 1.30*  CALCIUM 9.3 9.2 8.6*  GFRNONAA 44* 43* 36*  GFRAA 50* 49* 42*  PROT 7.0 6.8 6.6  ALBUMIN 3.3* 3.4* 3.4*  AST 30 39 23  ALT 22 26 19   ALKPHOS 88 69 64  BILITOT 0.4 0.5 0.5   Iron/TIBC/Ferritin/ %Sat    Component Value Date/Time   IRON 68 09/13/2019 1326   TIBC 260 09/13/2019 1326   FERRITIN 62 09/13/2019 1326   IRONPCTSAT 26 09/13/2019  [place] 06/19/2018 1006      RADIOGRAPHIC STUDIES: I have personally reviewed the radiological images as listed and agreed with the findings in the report. CT Chest Wo Contrast  Result Date: 07/26/2019 CLINICAL DATA:  Breast cancer.  Lung nodules. EXAM: CT CHEST WITHOUT CONTRAST TECHNIQUE: Multidetector CT imaging of the chest was performed following the standard protocol without IV contrast. COMPARISON:  PET 02/25/2019 in CT chest 02/17/2019. FINDINGS: Cardiovascular: Atherosclerotic calcification of the aorta and aortic valve. Heart size normal. No pericardial effusion. Mediastinum/Nodes: High right periesophageal lymph node measures 10 mm  (2/16), stable. Mediastinal adenopathy measures up to approximately 2.1 cm in short axis in the low right paratracheal station, similar. Partially calcified subcarinal lymph node measures 1.9 cm, stable. Hilar regions are difficult to evaluate without IV contrast. Left subpectoral and axillary adenopathy measures up to 2.2 cm (2/32), stable. No right axillary adenopathy. Esophagus is grossly unremarkable. Lungs/Pleura: Right pleural effusion is now moderate in size with associated compressive atelectasis in the right lower lobe. Right-sided pleural nodules and masses have enlarged significantly. Index pleural mass in the posterior lower right hemithorax measures 2.7 x 4.4 cm (2/99), compared to 1.2 x 1.9 cm on 02/17/2019. Enlarging ground-glass and solid nodules bilaterally. Index left lower lobe nodule measures 6 x 12 mm (4/107), previously 4 x 9 mm on 02/17/2019. Mild centrilobular emphysema. Airway is unremarkable. Upper Abdomen: Visualized portion of the liver is unremarkable. Adrenal glands appear thickened, as before. Low-attenuation lesions in the right kidney measure up to 3.9 cm, incompletely imaged. Stones in the kidneys bilaterally. Visualized portions of the spleen pancreas, stomach and bowel are unremarkable with the exception of a small hiatal hernia. Musculoskeletal: Degenerative changes in the spine. Old left posterolateral rib fracture. No worrisome lytic or sclerotic lesions. Flowing anterior osteophytosis in the thoracic spine. IMPRESSION: 1. Progression of pleuroparenchymal metastatic disease as evidenced by enlargement in the right pleural effusion, right pleural nodules/masses and bilateral pulmonary nodules. 2. Bulky mediastinal and left subpectoral/axillary, stable. 3. Bilateral renal stones. 4.  Aortic atherosclerosis (ICD10-I70.0). 5.  Emphysema (ICD10-J43.9). Electronically Signed   By: Lorin Picket M.D.   On: 07/26/2019 08:13   [place] CORE BIOPSY (LYMPH NODES)  Result Date:  07/06/2019 INDICATION: Left axillary adenopathy EXAM: ULTRASOUND GUIDED CORE  BIOPSY OF LEFT SUBPECTORAL LYMPH NODE MEDICATIONS: None. ANESTHESIA/SEDATION: None PROCEDURE: The procedure, risks, benefits, and alternatives were explained to the patient. Questions regarding the procedure were encouraged and answered. The patient understands and consents to the procedure. The left axilla was prepped with chlorhexidine in a sterile fashion, and a sterile drape was applied covering the operative field. A sterile gown and sterile gloves were used for the procedure. Local anesthesia was provided with 1% Lidocaine. Utilizing real-time ultrasound guidance a 17 gauge guiding needle was placed percutaneously into the left subpectoral lymph node mass. Multiple 18 gauge core biopsies were then obtained without difficulty. These were sent on moistened Telfa for pathologic evaluation. The patient tolerated the procedure well. Needle was removed without difficulty and hemostasis obtained at the puncture site. Puncture site was then dressed in the standard sterile manner. COMPLICATIONS: None immediate. FINDINGS: Two subpectoral lymph node masses are seen similar to that noted on prior PET-CT. IMPRESSION: Successful ultrasound-guided biopsy of left subpectoral lymph node mass. Electronically Signed   By: [name] M.D.   On: 07/06/2019 12:19      ASSESSMENT & PLAN:  1. Metastatic breast cancer ([place])   2. Use of anastrozole (Arimidex)  3. Post-menopausal    #Metastatic breast cancer, Pathology was reviewed and discussed with patient.  Adenocarcinoma with breast origin. stage IV disease, which is not curable.  Goal of care: Palliative. Labs are reviewed and discussed with patient. Continue Arimidex.  Options of adding CDK inhibitor was discussed with patient. Rationale and side effects were discussed.  I will check her coverage.  Also will obtain CT chest wo contrast prior to next visit for evaluation of treatment  response.    Chronic anemia secondary to CKD. Hemoglobin stable at 10.1 today. .  Recommend her to  continue oral iron supplementation.  Chronic use of Arimidex.  Recommend patient to continue calcium and vitamin D supplementation. Check DEXA.    Orders Placed This Encounter  Procedures  . DG Bone Density    Standing Status:   Future    Standing Expiration Date:   09/14/2020    Order Specific Question:   Reason for Exam (SYMPTOM  OR DIAGNOSIS REQUIRED)    Answer:   postmenopausal, aromatase inhibitor use    Order Specific Question:   Preferred imaging location?    Answer:   [facility]  . CBC with Differential/Platelet    Standing Status:   Future    Standing Expiration Date:   09/14/2020  . Comprehensive metabolic panel    Standing Status:   Future    Standing Expiration Date:   09/14/2020    All questions were answered. The patient knows to call the clinic with any problems questions or concerns. Return of visit:  4 weeks.   [name], MD, PhD Hematology Oncology [facility] Pager- [phone] 09/15/2019

## 2019-09-15 NOTE — Addendum Note (Signed)
Addended by: [name] on: 09/15/2019 07:54 PM   Modules accepted: Orders

## 2019-09-15 NOTE — Progress Notes (Signed)
Patient reports leg swelling during the day that goes down during the night.  Also having left arm swelling.

## 2019-09-16 ENCOUNTER — Telehealth: Payer: Self-pay

## 2019-09-16 NOTE — Telephone Encounter (Signed)
-----   Message from [name], MD sent at 09/15/2019  7:54 PM EDT ----- Please add a CT chest wo contrast to be done in 3-4 weeks, before next visit. thankyou

## 2019-09-16 NOTE — Telephone Encounter (Signed)
Pt's daughter, [name], has been notified. Please schedule CT as requested and contact pt with appt details. [name] will see those on Mychart as well. Thanks

## 2019-09-16 NOTE — Telephone Encounter (Signed)
Done   CT has been scheduled as requested. Called and made pts daughter [name] aware of the sched location,  date and time of her 10/05/19 appt

## 2019-09-27 ENCOUNTER — Other Ambulatory Visit: Payer: Self-pay | Admitting: Oncology

## 2019-09-29 ENCOUNTER — Other Ambulatory Visit: Payer: Medicare Other

## 2019-10-05 ENCOUNTER — Telehealth: Payer: Self-pay

## 2019-10-05 ENCOUNTER — Ambulatory Visit: Admission: RE | Admit: 2019-10-05 | Payer: Medicare Other | Source: Ambulatory Visit

## 2019-10-05 NOTE — Telephone Encounter (Signed)
appts rescheduled by Colletta [place] W:   DEXA 5/3 CT chest 5/4 lab/MD 5/7

## 2019-10-05 NOTE — Telephone Encounter (Signed)
Pt missed CT chest appointment on 4/20.  Please reschedule CT asap and move lab/MD appts on 4/28 to after CT (if CT is scheduled after 4/28). Please inform pt and daughter [name] of new appts. Thank you.

## 2019-10-05 NOTE — Telephone Encounter (Signed)
Please also reschedule DEXA scan prior to MD visit, if possible. Thank you

## 2019-10-09 ENCOUNTER — Other Ambulatory Visit: Payer: Self-pay | Admitting: Oncology

## 2019-10-12 ENCOUNTER — Other Ambulatory Visit: Payer: Medicare Other | Admitting: Nurse Practitioner

## 2019-10-12 ENCOUNTER — Other Ambulatory Visit: Payer: Self-pay

## 2019-10-12 ENCOUNTER — Telehealth: Payer: Self-pay

## 2019-10-12 ENCOUNTER — Encounter: Payer: Self-pay | Admitting: Nurse Practitioner

## 2019-10-12 DIAGNOSIS — Z515 Encounter for palliative care: Secondary | ICD-10-CM | POA: Diagnosis not present

## 2019-10-12 DIAGNOSIS — C50919 Malignant neoplasm of unspecified site of unspecified female breast: Secondary | ICD-10-CM | POA: Diagnosis not present

## 2019-10-12 DIAGNOSIS — Z139 Encounter for screening, unspecified: Secondary | ICD-10-CM | POA: Diagnosis not present

## 2019-10-12 NOTE — Telephone Encounter (Signed)
I received a call for NP Debera Lat from palliative care. Patient has been taking flexeril to help with pain which isn't appropriate. She asked since they have no way of doing refills if you would send in tramadol for patient. Cyril Mourning has discussed with patient & son, Marc Morgans about DNR as well as stopping chemo. She said if you have any questions call [phone].

## 2019-10-12 NOTE — Progress Notes (Signed)
[facility] Consult Note Telephone: [phone]  Fax: [phone]  PATIENT NAME: Tiffany Bailey DOB: 09/06/1927 MRN: [id number]  PRIMARY CARE PROVIDER:   [name], FNP  REFERRING PROVIDER:  [name], FNP [address] [place],  [place] [address]  RESPONSIBLE PARTY:   Self;   I was asked to see Tiffany Bailey by [name] FNP for Palliative care consult for goals of care  RECOMMENDATIONS and PLAN:  1. ACP: Full code, ongoing family discussion; Blank MOST form, Hard Choice book, Hospice information   2. Palliative care encounter; Palliative medicine team will continue to support patient, patient's family, and medical team. Visit consisted of counseling and education dealing with the complex and emotionally intense issues of symptom management and palliative care in the setting of serious and potentially life-threatening illness  I spent 95 minutes providing this consultation,  from 3:00pm to 4:35pm. More than 50% of the time in this consultation was spent coordinating communication.   HISTORY OF PRESENT ILLNESS:  Tiffany Bailey is a 86 y.o. year old female with multiple medical problems including Coronary artery disease is Plasti cabg with multiple pci's, myocardial infarction, carotid stenosis, hypertension, hyperlipidemia, history of breast s/p mastectomy cancer, chronic kidney disease, diabetes, arthritis, anemia, guard, adult failure to thrive, bilateral cataracts with phaco, bladder surgery, left above knee amputation, abdominal hysterectomy, abdominal tumor removed.  Followed by Dr Tasia Catchings for abnormal CT scan with complaints of urinary retention, headache, constipation. Workout significant for metastatic breast cancer stage 1 the not curable goal palliative continue Arimidex. Chronic anemia secondary to chronic kidney disease with stable hemoglobin. CT scan shows multiple nodular densities predicted over mid in  lower right lung common multiple bilateral pulmonary nodules, ground glass opacities.  Schedule face to face initial palliative care visit. I met with Tiffany Bailey and her daughter Tiffany Bailey. We talked about purpose of palliative care visit, in agreement. We talked about how she was feeling today. Tiffany Bailey endorses that she is doing okay. Tiffany Bailey is currently sitting in the wheelchair in the living room, appears comfortable. We talked about past medical history in we talked about past medical history, chronic disease progression including her cancer. We talked about her cardiac history also. We talked about natural progression of aging and changes that happen within one's body. We talked about her appointment with Dr Tasia Catchings Oncologist. We talked about symptoms of pain when she does experience phantom and left chest intermittently. Tiffany Bailey is currently is taking Flexeril for pain as needed. Tiffany Bailey does take Tylenol for headaches but did not realize she could take Tylenol for all types of pain. We talked about option of possible initiating Tramadol, will review with primary and after you. We talked about her appetite which is poor. Tiffany Bailey endorses that our appetite is improving she gained a few pounds with Megace. We talked about her weight was 115 and down to 80 lb. We talked more about recent diagnosis has of reoccurring cancer. We talked about Life review. We talked about quality of life. We talked about role of palliative care and plan of care. We talked about medical goals of care including aggressive versus conservative versus comfort care. We talked about code status and currently she is a full code. Initially Tiffany Bailey endorses she would not want to have CPR but then after thought about it said she wants to remain a full code but will further discuss with family. We talked  about her Hard Choice book, MOST form for review in addition to information concerning hospice. We talked about Hospice being a  Medicare benefit. We talked about what services could be provided. We talked about at present time while she currently is receiving treatment would need to wait until completed or decide to discontinue since treatment is not curative but palliative. We talked about follow-up visit with after you May 10th. Encouraged Tiffany Bailey to further discuss options with Dr. Tasia Catchings including Hospice if wishes. We talked about which would give them the greatest benefit of care. We talked about follow up palliative care visit after appointment with Dr Tasia Catchings. Schedule, Tiffany Bailey and Tiffany Bailey both in agreement. Therapeutic listening and emotional support provided. Contact information provided. Questions answered the satisfaction.  02/03/2019 x-ray of abdomen showed large colonic stool volume, nodular densities noted in the right mid lung are up). 02/08/2019 subsequent chest x-ray two-view showed hyperinflation of the lungs compatible with COPD.  Multiple nodular densities project over the mid and lower right lung. CT chest was obtained for further evaluation. 02/17/2019 CT chest without contrast Showed multiple bilateral pulmonary nodules,groundglass opacities. Largest solid nodule at the confluence of the right major and minor fissures measuring 1.6 cm.  Most notable groundglass nodule is of the right upper lobe and measure 1.8 x 0.8 cm, small right pleural effusion with associated atelectasis or consolidation.  Larger solid nodule are highly suspicious for metastatic disease. Bulky right mediastinal and hilar lymph node.  Largest pretracheal nodes measuring 3.5 x 3 cm.  There are additional bulky superior mediastinal, supraclavicular, left subpectoral lymph nodes.  Largest subpectoral nodes measuring 3.3 x 2.5 cm.   Palliative Care was asked to help address goals of care.   CODE STATUS: Full code  PPS: 50% HOSPICE ELIGIBILITY/DIAGNOSIS: TBD  PAST MEDICAL HISTORY:  Past Medical History:  Diagnosis Date   Anemia    Anginal  pain ([facility])    Arthritis    Breast cancer ([facility] [facility]) 07/19/2019   Cancer ([facility])    BREAST   CKD (chronic kidney disease), stage III    Coronary artery disease    S/P CABG and multiple PCI's   Diabetes mellitus without complication ([facility])    Edema    Failure to thrive in adult    GERD (gastroesophageal reflux disease)    Hb-SS disease without crisis ([facility]) 10/23/2017   History of breast cancer    HOH (hard of hearing)    Hyperlipidemia    Hypertension    Myocardial infarction ([facility])    Palpitations     SOCIAL HX:  Social History   Tobacco Use   Smoking status: Never Smoker   Smokeless tobacco: Never Used  Substance Use Topics   Alcohol use: No    ALLERGIES:  Allergies  Allergen Reactions   Amoxicillin Hives and Nausea Only   Gabapentin Other (See Comments)    AMS Confusion   Mirtazapine Itching    Dry mouth with ODT Denies SOB with medication   Other     UNKNOWN PAIN MEDICATION - unknown reaction     PERTINENT MEDICATIONS:  Outpatient Encounter Medications as of 10/12/2019  Medication Sig   acetaminophen (TYLENOL) 500 MG tablet Take 1 tablet (500 mg total) by mouth every 6 (six) hours as needed.   amLODipine (NORVASC) 10 MG tablet Take 1 tablet (10 mg total) by mouth daily.   anastrozole (ARIMIDEX) 1 MG tablet Take 1 tablet by mouth once daily   aspirin EC 81 MG tablet Take 1  tablet (81 mg total) by mouth daily.   atorvastatin (LIPITOR) 40 MG tablet TAKE 1 TABLET BY MOUTH  DAILY   blood glucose meter kit and supplies KIT Dispense based on patient and insurance preference. Use up to four times daily as directed. (FOR ICD-9 250.00, 250.01).   Chromium-Cinnamon (CINNAMON PLUS CHROMIUM) 6023059021 MCG-MG CAPS Take 2 capsules by mouth daily.   clopidogrel (PLAVIX) 75 MG tablet Take 1 tablet (75 mg total) by mouth daily.   cyclobenzaprine (FLEXERIL) 5 MG tablet Take 1 tablet (5 mg total) by mouth at bedtime as needed for muscle spasms.   ferrous sulfate  325 (65 FE) MG EC tablet Take 1 tablet (325 mg total) by mouth 2 (two) times daily with a meal.   fluticasone (FLONASE) 50 MCG/ACT nasal spray Place 1 spray into both nostrils daily.   glucose blood (ONE TOUCH TEST STRIPS) test strip Use to test blood sugar twice daily   hydrALAZINE (APRESOLINE) 10 MG tablet Take 10 mg by mouth 2 (two) times daily.   Insulin Glargine (BASAGLAR KWIKPEN) 100 UNIT/ML SOPN Inject 0.06 mLs (6 Units total) into the skin daily. [facility] as of  01/21/2019   Insulin Pen Needle (BD PEN NEEDLE NANO U/F) 32G X 4 MM MISC USE 1 DAILY   metoprolol tartrate (LOPRESSOR) 25 MG tablet TAKE 1 TABLET BY MOUTH TWO  TIMES DAILY   Multiple Vitamin (MULTIVITAMIN) tablet Take 1 tablet by mouth daily.   mupirocin ointment (BACTROBAN) 2 % Place 1 application into the nose 2 (two) times daily.   nitroGLYCERIN (NITROSTAT) 0.4 MG SL tablet Place 1 tablet (0.4 mg total) under the tongue every 5 (five) minutes as needed for chest pain.   ondansetron (ZOFRAN) 4 MG tablet Take 1 tablet (4 mg total) by mouth every 8 (eight) hours as needed for nausea or vomiting.   ONE TOUCH LANCETS MISC Use as directed 2 times per day   pantoprazole (PROTONIX) 40 MG tablet Take 1 tablet (40 mg total) by mouth at bedtime.   senna (SENOKOT) 8.6 MG TABS tablet Take 2 tablets (17.2 mg total) by mouth daily. Hold if diarrhea.   vitamin B-12 (CYANOCOBALAMIN) 1000 MCG tablet Take 1,000 mcg by mouth daily.   No facility-administered encounter medications on file as of 10/12/2019.    PHYSICAL EXAM:   General: NAD, frail appearing, thin, pleasant female Cardiovascular: regular rate and rhythm Pulmonary: clear ant fields Extremities: no edema, no joint deformities right BKA Neurological: w/c dependent;   Jeray Shugart Ihor Gully, NP

## 2019-10-13 ENCOUNTER — Other Ambulatory Visit: Payer: Medicare Other

## 2019-10-13 ENCOUNTER — Ambulatory Visit: Payer: Medicare Other | Admitting: Oncology

## 2019-10-13 MED ORDER — LIDOCAINE 5 % EX PTCH: 1.0000 | MEDICATED_PATCH | CUTANEOUS | 1 refills | Status: DC

## 2019-10-13 NOTE — Telephone Encounter (Signed)
Christin,  Thanks for your message and seeing Ms Tiffany Bailey know that tramadol will work due to her kidney function ( creatine clearance 17 ml/min).   [name], How often is she using flexeril?  Can we call patient and see if we could at least trial topical such as lidocaine patches?  I have sent in to [facility]  if she is willing to try. Ask her to call us if no improvement as may to move to stronger medication and I would consider narcotic.

## 2019-10-13 NOTE — Addendum Note (Signed)
Addended by: [name] on: 10/13/2019 12:55 PM   Modules accepted: Orders

## 2019-10-13 NOTE — Telephone Encounter (Signed)
I spoke with patient & she stated that she does not take flexeril every night, but does when her pain isn't bearable. She stated that she is always in pain & would really like something better & stronger. Patient aware that she can pick up lidocaine patches at pharmacy.

## 2019-10-13 NOTE — Telephone Encounter (Signed)
Noted Trial lidocaine

## 2019-10-13 NOTE — Telephone Encounter (Signed)
CORRECTION TO YESTERDAYS MESSAGE! I misunderstood my conversation with Debera Lat, NP. She did not discuss stopping chemo with patient. They discuss all options for patient & well as hospice care. She said that patient may have some more imaging done to see if there are any changes. Patient also is a full code patient at this time even though DNR was discussed.

## 2019-10-14 DIAGNOSIS — H6123 Impacted cerumen, bilateral: Secondary | ICD-10-CM | POA: Diagnosis not present

## 2019-10-14 DIAGNOSIS — J310 Chronic rhinitis: Secondary | ICD-10-CM | POA: Diagnosis not present

## 2019-10-18 ENCOUNTER — Other Ambulatory Visit: Payer: Medicare Other

## 2019-10-19 ENCOUNTER — Ambulatory Visit
Admission: RE | Admit: 2019-10-19 | Discharge: 2019-10-19 | Disposition: A | Discharge status: Home / Self Care | Payer: Medicare Other | Source: Ambulatory Visit | Attending: Oncology | Admitting: Oncology

## 2019-10-19 ENCOUNTER — Other Ambulatory Visit: Payer: Self-pay

## 2019-10-19 ENCOUNTER — Encounter: Payer: Self-pay | Admitting: Emergency Medicine

## 2019-10-19 ENCOUNTER — Emergency Department
Admission: EM | Admit: 2019-10-19 | Discharge: 2019-10-19 | Disposition: A | Discharge status: Home / Self Care | Payer: Medicare Other | Attending: Student in an Organized Health Care Education/Training Program | Admitting: Student in an Organized Health Care Education/Training Program

## 2019-10-19 ENCOUNTER — Emergency Department: Payer: Medicare Other

## 2019-10-19 DIAGNOSIS — Z79811 Long term (current) use of aromatase inhibitors: Secondary | ICD-10-CM

## 2019-10-19 DIAGNOSIS — R918 Other nonspecific abnormal finding of lung field: Secondary | ICD-10-CM | POA: Diagnosis not present

## 2019-10-19 DIAGNOSIS — E119 Type 2 diabetes mellitus without complications: Secondary | ICD-10-CM | POA: Insufficient documentation

## 2019-10-19 DIAGNOSIS — S0990XA Unspecified injury of head, initial encounter: Secondary | ICD-10-CM | POA: Insufficient documentation

## 2019-10-19 DIAGNOSIS — Y939 Activity, unspecified: Secondary | ICD-10-CM | POA: Insufficient documentation

## 2019-10-19 DIAGNOSIS — Z794 Long term (current) use of insulin: Secondary | ICD-10-CM | POA: Diagnosis not present

## 2019-10-19 DIAGNOSIS — Z951 Presence of aortocoronary bypass graft: Secondary | ICD-10-CM | POA: Diagnosis not present

## 2019-10-19 DIAGNOSIS — W1839XA Other fall on same level, initial encounter: Secondary | ICD-10-CM | POA: Diagnosis not present

## 2019-10-19 DIAGNOSIS — C50919 Malignant neoplasm of unspecified site of unspecified female breast: Secondary | ICD-10-CM | POA: Insufficient documentation

## 2019-10-19 DIAGNOSIS — Z853 Personal history of malignant neoplasm of breast: Secondary | ICD-10-CM | POA: Insufficient documentation

## 2019-10-19 DIAGNOSIS — G44309 Post-traumatic headache, unspecified, not intractable: Secondary | ICD-10-CM | POA: Diagnosis not present

## 2019-10-19 DIAGNOSIS — N183 Chronic kidney disease, stage 3 unspecified: Secondary | ICD-10-CM | POA: Diagnosis not present

## 2019-10-19 DIAGNOSIS — Y929 Unspecified place or not applicable: Secondary | ICD-10-CM | POA: Diagnosis not present

## 2019-10-19 DIAGNOSIS — D571 Sickle-cell disease without crisis: Secondary | ICD-10-CM | POA: Diagnosis not present

## 2019-10-19 DIAGNOSIS — Y999 Unspecified external cause status: Secondary | ICD-10-CM | POA: Insufficient documentation

## 2019-10-19 DIAGNOSIS — I1310 Hypertensive heart and chronic kidney disease without heart failure, with stage 1 through stage 4 chronic kidney disease, or unspecified chronic kidney disease: Secondary | ICD-10-CM | POA: Diagnosis not present

## 2019-10-19 DIAGNOSIS — Z79899 Other long term (current) drug therapy: Secondary | ICD-10-CM | POA: Insufficient documentation

## 2019-10-19 NOTE — ED Provider Notes (Signed)
Tiffany Bailey   ____________________________________________   First MD Initiated Contact with Patient 10/19/19 1116     (approximate)  I have reviewed the triage vital signs and the nursing notes.   HISTORY  Chief Complaint Head Injury    HPI Tiffany Bailey is a 86 y.o. female patient complain of headache and right ear pain secondary to a fall last week.  Patient states mechanical fall  and hit her head on  Concrete.  Patient denies LOC.  Patient denies vision disturbance or weakness.  Patient denies vertigo but states she is having right ear pain.  Patient denies hearing loss.  Patient stated no relief over-the-counter medications.  Patient rates the pain as a 5/10.  Patient describes the pain as "achy".       Past Medical History:  Diagnosis Date  . Anemia   . Anginal pain ([place])   . Arthritis   . Breast cancer ([place]) 07/19/2019  . Cancer (HCC)    BREAST  . CKD (chronic kidney disease), stage III   . Coronary artery disease    S/P CABG and multiple PCI's  . Diabetes mellitus without complication ([place])   . Edema   . Failure to thrive in adult   . GERD (gastroesophageal reflux disease)   . Hb-SS disease without crisis ([place]) 10/23/2017  . History of breast cancer   . HOH (hard of hearing)   . Hyperlipidemia   . Hypertension   . Myocardial infarction ([place])   . Palpitations     Patient Active Problem List   Diagnosis Date Noted  . Use of anastrozole (Arimidex) 08/18/2019  . Anemia due to stage 3a chronic kidney disease 08/18/2019  . 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 Nausea 08/03/2019  . Breast cancer ([place]) 07/19/2019  . Goals of care, counseling/discussion 07/19/2019  . Lung mass 02/10/2019  . Constipation 01/27/2019  . Headache 01/25/2019  . Right leg swelling 01/25/2019  . Metastatic breast cancer ([place]) 01/05/2019  . Anxiety and depression 12/16/2018  . Hx of AKA (above knee amputation), left  ([place]) 08/26/2018  . Left arm swelling 07/01/2018  . Carotid stenosis 12/19/2017  . CAD (coronary artery disease) 10/23/2017  . Hb-SS disease without crisis ([place]) 10/23/2017  . History of CHF (congestive heart failure) 10/23/2017  . Old myocardial infarction 10/23/2017  . Hand cramp 10/17/2017  . Acute non-recurrent maxillary sinusitis 10/17/2017  . Skin lesion 09/29/2017  . Diarrhea 04/23/2017  . Weight loss 09/12/2016  . CKD stage 3 due to type 2 diabetes mellitus ([place]) 08/14/2016  . PAD (peripheral artery disease) ([place]) 08/14/2016  . DM (diabetes mellitus) ([place]) 03/25/2016  . Anemia 03/25/2016  . Chronic tension-type headache, not intractable 03/25/2016  . Essential hypertension 03/25/2016  . CAD (coronary artery disease), native coronary artery 03/25/2016  . GERD (gastroesophageal reflux disease) 03/25/2016  . Hyperlipidemia 03/25/2016  . History of breast cancer 03/25/2016  . AKI (acute kidney injury) ([place]) 01/28/2016  . Hyperkalemia 01/28/2016  . Hyponatremia 01/28/2016  . Urinary retention 01/28/2016  . Hypertensive urgency 01/15/2016  . History of nonadherence to medical treatment 09/08/2015  . Epigastric pain 08/10/2014  . Ischemic heart disease due to coronary artery obstruction ([place]) 01/07/2013    Past Surgical History:  Procedure Laterality Date  . ABDOMINAL HYSTERECTOMY    . ABOVE KNEE LEG AMPUTATION Left 2013  . ANTERIOR VITRECTOMY Left 11/16/2015   Procedure: ANTERIOR VITRECTOMY;  Surgeon: Eulogio Bear, MD;  Location: [facility] ORS;  Service: Ophthalmology;  Laterality: Left;  . BLADDER SURGERY    . CATARACT EXTRACTION W/PHACO Left 11/16/2015   Procedure: CATARACT EXTRACTION PHACO AND INTRAOCULAR LENS PLACEMENT (IOC);  Surgeon: Eulogio Bear, MD;  Location: [facility] ORS;  Service: Ophthalmology;  Laterality: Left;  Lot # [id number] H [place]; 01:22.8 AP%:12.6 CDE: 10.46  . CATARACT EXTRACTION W/PHACO Right 08/22/2016   Procedure: CATARACT EXTRACTION PHACO AND INTRAOCULAR  LENS PLACEMENT (IOC);  Surgeon: Eulogio Bear, MD;  Location: [facility] ORS;  Service: Ophthalmology;  Laterality: Right;  Lot # [id number] H [place]: 00:52.1 AP%:8.7 CDE: 4.99   . CORONARY ANGIOPLASTY     STENT  . CORONARY ARTERY BYPASS GRAFT    . LOWER EXTREMITY ANGIOGRAPHY Right 01/14/2019   Procedure: LOWER EXTREMITY ANGIOGRAPHY;  Surgeon: Algernon Huxley, MD;  Location: [facility];  Service: Cardiovascular;  Laterality: Right;  . MASTECTOMY    . TUMOR REMOVAL     ABDOMINAL    Prior to Admission medications   Medication Sig Start Date End Date Taking? Authorizing Provider  acetaminophen (TYLENOL) 500 MG tablet Take 1 tablet (500 mg total) by mouth every 6 (six) hours as needed. 01/15/17   [name], DO  amLODipine (NORVASC) 10 MG tablet Take 1 tablet (10 mg total) by mouth daily. 11/24/18   Minna Merritts, MD  anastrozole (ARIMIDEX) 1 MG tablet Take 1 tablet by mouth once daily 10/11/19   [name], MD  aspirin EC 81 MG tablet Take 1 tablet (81 mg total) by mouth daily. 01/15/17   [name], DO  atorvastatin (LIPITOR) 40 MG tablet TAKE 1 TABLET BY MOUTH  DAILY 06/21/19   [name], FNP  blood glucose meter kit and supplies KIT Dispense based on patient and insurance preference. Use up to four times daily as directed. (FOR ICD-9 250.00, 250.01). 01/13/17   [name], DO  Chromium-Cinnamon (CINNAMON PLUS CHROMIUM) 6504628199 MCG-MG CAPS Take 2 capsules by mouth daily.    [provider]  clopidogrel (PLAVIX) 75 MG tablet Take 1 tablet (75 mg total) by mouth daily. 11/24/18   Minna Merritts, MD  cyclobenzaprine (FLEXERIL) 5 MG tablet Take 1 tablet (5 mg total) by mouth at bedtime as needed for muscle spasms. 08/26/18   [name], FNP  ferrous sulfate 325 (65 FE) MG EC tablet Take 1 tablet (325 mg total) by mouth 2 (two) times daily with a meal. 08/17/19   [name], MD  fluticasone [facility]) 50 MCG/ACT nasal spray Place 1 spray into both nostrils daily. 07/08/18    [name], FNP  glucose blood (ONE TOUCH TEST STRIPS) test strip Use to test blood sugar twice daily 09/17/17   [name], FNP  hydrALAZINE (APRESOLINE) 10 MG tablet Take 10 mg by mouth 2 (two) times daily. 04/02/19   [provider]  Insulin Glargine (BASAGLAR KWIKPEN) 100 UNIT/ML SOPN Inject 0.06 mLs (6 Units total) into the skin daily. [facility] endocrine as of  01/21/2019 03/02/19   McLean-Scocuzza, Nino Glow, MD  Insulin Pen Needle (BD PEN NEEDLE NANO U/F) 32G X 4 MM MISC USE 1 DAILY 08/03/18   [name], FNP  lidocaine (LIDODERM) 5 % Place 1 patch onto the skin daily. Remove & Discard patch within 12 hours. 10/13/19   [name], FNP  metoprolol tartrate (LOPRESSOR) 25 MG tablet TAKE 1 TABLET BY MOUTH TWO  TIMES DAILY 04/02/19   [name], FNP  Multiple Vitamin (MULTIVITAMIN) tablet Take 1  tablet by mouth daily.    [provider]  mupirocin ointment (BACTROBAN) 2 % Place 1 application into the nose 2 (two) times daily. 08/02/19   Crecencio Mc, MD  nitroGLYCERIN (NITROSTAT) 0.4 MG SL tablet Place 1 tablet (0.4 mg total) under the tongue every 5 (five) minutes as needed for chest pain. 12/19/17   Minna Merritts, MD  ondansetron (ZOFRAN) 4 MG tablet Take 1 tablet (4 mg total) by mouth every 8 (eight) hours as needed for nausea or vomiting. 08/02/19   [name], MD  ONE [facility] LANCETS MISC Use as directed 2 times per day 09/17/17   [name], FNP  pantoprazole (PROTONIX) 40 MG tablet Take 1 tablet (40 mg total) by mouth at bedtime. 08/02/19   Crecencio Mc, MD  senna (SENOKOT) 8.6 MG TABS tablet Take 2 tablets (17.2 mg total) by mouth daily. Hold if diarrhea. 08/02/19   [name], MD  vitamin B-12 (CYANOCOBALAMIN) 1000 MCG tablet Take 1,000 mcg by mouth daily.    [provider]    Allergies Amoxicillin, Gabapentin, Mirtazapine, and Other  Family History  Problem Relation Age of Onset  . Sudden death Mother   . Arthritis Father     . Stroke Father   . Breast cancer Sister   . Diabetes Grandchild     Social History Social History   Tobacco Use  . Smoking status: Never Smoker  . Smokeless tobacco: Never Used  Substance Use Topics  . Alcohol use: No  . Drug use: No    Review of Systems Constitutional: No fever/chills Eyes: No visual changes. ENT: No sore throat. Cardiovascular: Denies chest pain. Respiratory: Denies shortness of breath. Gastrointestinal: No abdominal pain.  No nausea, no vomiting.  No diarrhea.  No constipation. Genitourinary: Negative for dysuria. Musculoskeletal: Negative for back pain. Skin: Negative for rash. Neurological: Negative for headaches, focal weakness or numbness. Psychiatric:  Anxiety and depression Endocrine:  Chronic kidney disease, diabetes, hyperlipidemia, and hypertension. Allergic/Immunilogical: Amoxil and gabapentin. ____________________________________________   PHYSICAL EXAM:  VITAL SIGNS: ED Triage Vitals  Enc Vitals Group     BP 10/19/19 1053 (!) 149/42     Pulse Rate 10/19/19 1053 67     Resp 10/19/19 1053 18     Temp 10/19/19 1053 98.4 F (36.9 C)     Temp Source 10/19/19 1053 Oral     SpO2 10/19/19 1053 99 %     Weight 10/19/19 1048 83 lb 15.9 oz (38.1 kg)     Height 10/19/19 1048 5' (1.524 m)     Head Circumference --      Peak Flow --      Pain Score 10/19/19 1048 5     Pain Loc --      Pain Edu? --      Excl. in [place]? --     Constitutional: Alert and oriented. Well appearing and in no acute distress. Eyes: Conjunctivae are normal. PERRL. EOMI. Head: Atraumatic. Nose: No congestion/rhinnorhea. Neck: No stridor.  No cervical spine tenderness to palpation. Hematological/Lymphatic/Immunilogical: No cervical lymphadenopathy. Cardiovascular: Normal rate, regular rhythm. Grossly normal heart sounds.  Good peripheral circulation. Respiratory: Normal respiratory effort.  No retractions. Lungs CTAB. Gastrointestinal: Soft and nontender. No  distention. No abdominal bruits. No CVA tenderness. Genitourinary: Deferred Musculoskeletal: Left AKA.   Neurologic:  Normal speech and language. No gross focal neurologic deficits are appreciated. No gait instability. Skin:  Skin is warm, dry and intact. No rash noted. Psychiatric: Mood and affect  are normal. Speech and behavior are normal.  ____________________________________________   LABS (all labs ordered are listed, but only abnormal results are displayed)  Labs Reviewed - No data to display ____________________________________________  EKG   ____________________________________________  RADIOLOGY  ED MD interpretation:    Official radiology report(s): CT Head Wo Contrast  Result Date: 10/19/2019 CLINICAL DATA:  Fall last week with head injury. Posttraumatic headache. EXAM: CT HEAD WITHOUT CONTRAST TECHNIQUE: Contiguous axial images were obtained from the base of the skull through the vertex without intravenous contrast. COMPARISON:  11/10/2018 FINDINGS: Brain: Ventricles, cisterns and other CSF spaces are normal. There is mild chronic ischemic microvascular disease. There is no mass, mass effect, shift of midline structures or acute hemorrhage. No evidence of acute infarction. Bilateral basal ganglia calcifications are present. Vascular: No hyperdense vessel or unexpected calcification. Skull: Normal. Negative for fracture or focal lesion. Sinuses/Orbits: No acute finding. Other: None. IMPRESSION: 1.  No acute findings. 2.  Mild chronic ischemic microvascular disease. Electronically Signed   By: Marin Olp M.D.   On: 10/19/2019 12:25    ____________________________________________   PROCEDURES  Procedure(s) performed (including Critical Care):  Procedures   ____________________________________________   INITIAL IMPRESSION / ASSESSMENT AND PLAN / ED COURSE  As part of my medical decision making, I reviewed the following data within the [place]      Patient presents with frontal headache and right ear pain secondary to a fall which occurred 1 week ago.  Differential consist of intracranial bleed or postconcussion syndrome.  Patient hit her head on cement but denies LOC.  Discussed no acute findings on head CT.  Patient complaining physical exam consistent with post concussion.  Patient given discharge care instruction advised over-the-counter Tylenol at this time.  Advised to follow-up for PCP in 1 week.  Return to ED if condition worsens.    Tiffany Bailey was evaluated in Emergency Department on 10/19/2019 for the symptoms described in the history of present illness. She was evaluated in the context of the global COVID-19 pandemic, which necessitated consideration that the patient might be at risk for infection with the SARS-CoV-2 virus that causes COVID-19. Institutional protocols and algorithms that pertain to the evaluation of patients at risk for COVID-19 are in a state of rapid change based on information released by regulatory bodies including the CDC and federal and state organizations. These policies and algorithms were followed during the patient's care in the ED.       ____________________________________________   FINAL CLINICAL IMPRESSION(S) / ED DIAGNOSES  Final diagnoses:  Minor head injury, initial encounter     ED Discharge Orders    None       Bailey:  This document was prepared using Dragon voice recognition software and may include unintentional dictation errors.    Sable Feil, PA-C 10/19/19 1246    Merlyn Lot, MD 10/19/19 1416

## 2019-10-19 NOTE — ED Notes (Signed)
See triage note  Presents s/p head injury    States she fell last week  Hit head on cement  conts ot have headache and neck pain.

## 2019-10-19 NOTE — Discharge Instructions (Signed)
Your head CT scan was grossly unremarkable from your fall 1 week ago.  Follow discharge care instructions and take extra strength Tylenol for headache.

## 2019-10-19 NOTE — ED Triage Notes (Signed)
Fell last week, hit head on concrete.  No LOC.  Continues to c/o intermittent pain to back of head and right ear.  AAOx3.  Skin warm and dry. NAD

## 2019-10-20 ENCOUNTER — Encounter: Payer: Self-pay | Admitting: Radiology

## 2019-10-20 ENCOUNTER — Ambulatory Visit
Admission: RE | Admit: 2019-10-20 | Discharge: 2019-10-20 | Disposition: A | Discharge status: Home / Self Care | Payer: Medicare Other | Source: Ambulatory Visit | Attending: Oncology | Admitting: Oncology

## 2019-10-20 DIAGNOSIS — Z780 Asymptomatic menopausal state: Secondary | ICD-10-CM | POA: Insufficient documentation

## 2019-10-20 DIAGNOSIS — C50919 Malignant neoplasm of unspecified site of unspecified female breast: Secondary | ICD-10-CM | POA: Diagnosis not present

## 2019-10-20 DIAGNOSIS — Z79811 Long term (current) use of aromatase inhibitors: Secondary | ICD-10-CM | POA: Diagnosis not present

## 2019-10-20 DIAGNOSIS — M818 Other osteoporosis without current pathological fracture: Secondary | ICD-10-CM | POA: Diagnosis not present

## 2019-10-20 DIAGNOSIS — M810 Age-related osteoporosis without current pathological fracture: Secondary | ICD-10-CM | POA: Diagnosis not present

## 2019-10-22 ENCOUNTER — Ambulatory Visit: Payer: Medicare Other | Admitting: Oncology

## 2019-10-22 ENCOUNTER — Other Ambulatory Visit: Payer: Medicare Other

## 2019-10-25 ENCOUNTER — Encounter: Payer: Self-pay | Admitting: Oncology

## 2019-10-25 ENCOUNTER — Inpatient Hospital Stay (HOSPITAL_BASED_OUTPATIENT_CLINIC_OR_DEPARTMENT_OTHER): Payer: Medicare Other | Admitting: Oncology

## 2019-10-25 ENCOUNTER — Inpatient Hospital Stay: Payer: Medicare Other | Attending: Oncology

## 2019-10-25 ENCOUNTER — Telehealth: Payer: Self-pay

## 2019-10-25 ENCOUNTER — Other Ambulatory Visit: Payer: Self-pay

## 2019-10-25 VITALS — BP 171/68 | HR 67 | Temp 97.6°F | Resp 16

## 2019-10-25 DIAGNOSIS — E1122 Type 2 diabetes mellitus with diabetic chronic kidney disease: Secondary | ICD-10-CM | POA: Insufficient documentation

## 2019-10-25 DIAGNOSIS — N1831 Chronic kidney disease, stage 3a: Secondary | ICD-10-CM | POA: Diagnosis not present

## 2019-10-25 DIAGNOSIS — C7802 Secondary malignant neoplasm of left lung: Secondary | ICD-10-CM | POA: Diagnosis not present

## 2019-10-25 DIAGNOSIS — R339 Retention of urine, unspecified: Secondary | ICD-10-CM | POA: Insufficient documentation

## 2019-10-25 DIAGNOSIS — M8580 Other specified disorders of bone density and structure, unspecified site: Secondary | ICD-10-CM | POA: Insufficient documentation

## 2019-10-25 DIAGNOSIS — I129 Hypertensive chronic kidney disease with stage 1 through stage 4 chronic kidney disease, or unspecified chronic kidney disease: Secondary | ICD-10-CM | POA: Diagnosis not present

## 2019-10-25 DIAGNOSIS — C50919 Malignant neoplasm of unspecified site of unspecified female breast: Secondary | ICD-10-CM | POA: Diagnosis not present

## 2019-10-25 DIAGNOSIS — Z79811 Long term (current) use of aromatase inhibitors: Secondary | ICD-10-CM

## 2019-10-25 DIAGNOSIS — Z794 Long term (current) use of insulin: Secondary | ICD-10-CM | POA: Insufficient documentation

## 2019-10-25 DIAGNOSIS — I252 Old myocardial infarction: Secondary | ICD-10-CM | POA: Insufficient documentation

## 2019-10-25 DIAGNOSIS — Z853 Personal history of malignant neoplasm of breast: Secondary | ICD-10-CM | POA: Diagnosis not present

## 2019-10-25 DIAGNOSIS — D571 Sickle-cell disease without crisis: Secondary | ICD-10-CM | POA: Diagnosis not present

## 2019-10-25 DIAGNOSIS — K5900 Constipation, unspecified: Secondary | ICD-10-CM | POA: Insufficient documentation

## 2019-10-25 DIAGNOSIS — C7801 Secondary malignant neoplasm of right lung: Secondary | ICD-10-CM | POA: Insufficient documentation

## 2019-10-25 DIAGNOSIS — D631 Anemia in chronic kidney disease: Secondary | ICD-10-CM

## 2019-10-25 DIAGNOSIS — Z89612 Acquired absence of left leg above knee: Secondary | ICD-10-CM | POA: Insufficient documentation

## 2019-10-25 DIAGNOSIS — Z803 Family history of malignant neoplasm of breast: Secondary | ICD-10-CM | POA: Diagnosis not present

## 2019-10-25 LAB — COMPREHENSIVE METABOLIC PANEL
ALT: 33 U/L (ref 0–44)
AST: 31 U/L (ref 15–41)
Albumin: 3.4 g/dL — ABNORMAL LOW (ref 3.5–5.0)
Alkaline Phosphatase: 81 U/L (ref 38–126)
Anion gap: 8 (ref 5–15)
BUN: 31 mg/dL — ABNORMAL HIGH (ref 8–23)
CO2: 20 mmol/L — ABNORMAL LOW (ref 22–32)
Calcium: 8.7 mg/dL — ABNORMAL LOW (ref 8.9–10.3)
Chloride: 106 mmol/L (ref 98–111)
Creatinine, Ser: 1.22 mg/dL — ABNORMAL HIGH (ref 0.44–1.00)
GFR calc Af Amer: 45 mL/min — ABNORMAL LOW (ref >60)
GFR calc non Af Amer: 39 mL/min — ABNORMAL LOW (ref >60)
Glucose, Bld: 203 mg/dL — ABNORMAL HIGH (ref 70–99)
Potassium: 3.6 mmol/L (ref 3.5–5.1)
Sodium: 134 mmol/L — ABNORMAL LOW (ref 135–145)
Total Bilirubin: 0.7 mg/dL (ref 0.3–1.2)
Total Protein: 6.5 g/dL (ref 6.5–8.1)

## 2019-10-25 LAB — CBC WITH DIFFERENTIAL/PLATELET
Abs Immature Granulocytes: 0.01 10*3/uL (ref 0.00–0.07)
Basophils Absolute: 0.1 10*3/uL (ref 0.0–0.1)
Basophils Relative: 1 %
Eosinophils Absolute: 0.5 10*3/uL (ref 0.0–0.5)
Eosinophils Relative: 8 %
HCT: 29.9 % — ABNORMAL LOW (ref 36.0–46.0)
Hemoglobin: 10 g/dL — ABNORMAL LOW (ref 12.0–15.0)
Immature Granulocytes: 0 %
Lymphocytes Relative: 15 %
Lymphs Abs: 0.9 10*3/uL (ref 0.7–4.0)
MCH: 29.8 pg (ref 26.0–34.0)
MCHC: 33.4 g/dL (ref 30.0–36.0)
MCV: 89 fL (ref 80.0–100.0)
Monocytes Absolute: 0.6 10*3/uL (ref 0.1–1.0)
Monocytes Relative: 9 %
Neutro Abs: 4.1 10*3/uL (ref 1.7–7.7)
Neutrophils Relative %: 67 %
Platelets: 285 10*3/uL (ref 150–400)
RBC: 3.36 MIL/uL — ABNORMAL LOW (ref 3.87–5.11)
RDW: 14.8 % (ref 11.5–15.5)
WBC: 6.1 10*3/uL (ref 4.0–10.5)
nRBC: 0 % (ref 0.0–0.2)

## 2019-10-25 NOTE — Progress Notes (Signed)
Went to ER on 10/19/19 due to fall.  Still have right ear pain.

## 2019-10-25 NOTE — Progress Notes (Signed)
Hematology/Oncology  Follow up note [facility] Telephone:([phone]) [phone] Fax:([phone]) [phone]   Patient Care Team: [name], FNP as PCP - General (Family Medicine) Rockey Situ [name], MD as PCP - Cardiology (Cardiology)  REFERRING PROVIDER: [name], FNP  CHIEF COMPLAINTS/REASON FOR VISIT:  Follow up for breast cancer  HISTORY OF PRESENTING ILLNESS:   Tiffany Bailey is a  86 y.o.  female with PMH listed below was seen in consultation at the request of  [name], FNP  for evaluation of abnormal CT scan. Patient was recently evaluated by primary care provider for complaints of urinary retention, headache, constipation. 02/03/2019 x-ray of abdomen showed large colonic stool volume, nodular densities noted in the right mid lung are up). 02/08/2019 subsequent chest x-ray two-view showed hyperinflation of the lungs compatible with COPD.  Multiple nodular densities project over the mid and lower right lung. CT chest was obtained for further evaluation. 02/17/2019 CT chest without contrast Showed multiple bilateral pulmonary nodules,groundglass opacities. Largest solid nodule at the confluence of the right major and minor fissures measuring 1.6 cm.  Most notable groundglass nodule is of the right upper lobe and measure 1.8 x 0.8 cm, small right pleural effusion with associated atelectasis or consolidation.  Larger solid nodule are highly suspicious for metastatic disease. Bulky right mediastinal and hilar lymph node.  Largest pretracheal nodes measuring 3.5 x 3 cm.  There are additional bulky superior mediastinal, supraclavicular, left subpectoral lymph nodes.  Largest subpectoral nodes measuring 3.3 x 2.5 cm.  Patient was sent to cancer center for further evaluation.  #Patient reports a history of left breast cancer status post mastectomy.  She is a poor historian.  She is not able to recall the timeframe of breast cancer diagnosis and then treatments.   Denies any chemotherapy or radiation treatments.  #Left lower extremity history of amputation, CKD, History of MI, Sickle cell disease, DM, HTN, anemia.   Patient denies shortness of breath, chest pain, hemoptysis.  Night sweating, abdominal pain. Reports intentional weight loss.  Appetite is poor.  Not eating much. Also have wax and wane, chronic intermittent headache, she takes Tylenol as needed, with some relief.  Denies any nausea vomiting. She sees neurology. 11/10/2018 CT head showed no acute intracranial abnormality or significant interval changes.  Stable atrophy and white matter disease.  Atherosclerosis.  #Patient had a consultation visit with me on 02/19/2019.  At that time he was referred for abnormal CT scan.  PET scan was obtained and I recommend patient to obtain left axillary lymph node biopsy.  Patient/family called back and decided not to proceed with any additional work-up.  #February 2020 started Arimidex INTERVAL HISTORY Tiffany Bailey is a 86 y.o. female who has above history reviewed by me today presents for follow up visit for management of metastatic breast cancer. Problems and complaints are listed below: Patient reports feeling well.  She is on Arimidex.  Denies any pain, shortness of breath. Patient was accompanied by her daughter. Review of Systems  Constitutional: Positive for fatigue. Negative for appetite change, chills, fever and unexpected weight change.  HENT:   Negative for hearing loss and voice change.   Eyes: Negative for eye problems.  Respiratory: Negative for chest tightness, cough and shortness of breath.   Cardiovascular: Negative for chest pain.  Gastrointestinal: Negative for abdominal distention, abdominal pain, blood in stool and constipation.  Endocrine: Negative for hot flashes.  Genitourinary: Negative for difficulty urinating and frequency.   Musculoskeletal: Negative  for arthralgias.       Left lower extremity history of amputation  Skin:  Negative for itching and rash.  Neurological: Negative for extremity weakness.  Hematological: Negative for adenopathy.  Psychiatric/Behavioral: Negative for confusion.    MEDICAL HISTORY:  Past Medical History:  Diagnosis Date  . Anemia   . Anginal pain ([place])   . Arthritis   . Breast cancer ([place]) 07/19/2019  . Cancer (HCC)    BREAST  . CKD (chronic kidney disease), stage III   . Coronary artery disease    S/P CABG and multiple PCI's  . Diabetes mellitus without complication ([place])   . Edema   . Failure to thrive in adult   . GERD (gastroesophageal reflux disease)   . Hb-SS disease without crisis ([place]) 10/23/2017  . History of breast cancer   . HOH (hard of hearing)   . Hyperlipidemia   . Hypertension   . Myocardial infarction ([place])   . Palpitations     SURGICAL HISTORY: Past Surgical History:  Procedure Laterality Date  . ABDOMINAL HYSTERECTOMY    . ABOVE KNEE LEG AMPUTATION Left 2013  . ANTERIOR VITRECTOMY Left 11/16/2015   Procedure: ANTERIOR VITRECTOMY;  Surgeon: Eulogio Bear, MD;  Location: [facility] ORS;  Service: Ophthalmology;  Laterality: Left;  . BLADDER SURGERY    . CATARACT EXTRACTION W/PHACO Left 11/16/2015   Procedure: CATARACT EXTRACTION PHACO AND INTRAOCULAR LENS PLACEMENT (IOC);  Surgeon: Eulogio Bear, MD;  Location: [facility] OR[facility];  Service: Ophthalmology;  Laterality: Left;  Lot # [id number] H [place]; 01:22.8 AP%:12.6 CDE: 10.46  . CATARACT EXTRACTION W/PHACO Right 08/22/2016   Procedure: CATARACT EXTRACTION PHACO AND INTRAOCULAR LENS PLACEMENT (IOC);  Surgeon: Eulogio Bear, MD;  Location: [facility] OR[facility];  Service: Ophthalmology;  Laterality: Right;  Lot # [id number] H [place]: 00:52.1 AP%:8.7 CDE: 4.99   . CORONARY ANGIOPLASTY     STENT  . CORONARY ARTERY BYPASS GRAFT    . LOWER EXTREMITY ANGIOGRAPHY Right 01/14/2019   Procedure: LOWER EXTREMITY ANGIOGRAPHY;  Surgeon: Algernon Huxley, MD;  Location: [facility];  Service: Cardiovascular;  Laterality: Right;  .  MASTECTOMY    . TUMOR REMOVAL     ABDOMINAL    SOCIAL HISTORY: Social History   Socioeconomic History  . Marital status: Divorced    Spouse name: Not on file  . Number of children: 7  . Years of education: Not on file  . Highest education level: Not on file  Occupational History  . 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 Smoking status: Never Smoker  . Smokeless tobacco: Never Used  Substance and Sexual Activity  . Alcohol use: No  . Drug use: No  . Sexual activity: Not Currently  Other Topics Concern  . Not on file  Social History Narrative  . Not on file   Social Determinants of Health   Financial Resource Strain: Low Risk   . Difficulty of Paying Living Expenses: Not hard at all  Food Insecurity: No Food Insecurity  . Worried About Charity fundraiser in the Last Year: Never true  . Ran Out of Food in the Last Year: Never true  Transportation Needs: No Transportation Needs  . Lack of Transportation (Medical): No  . Lack of Transportation (Non-Medical): No  Physical Activity: Insufficiently Active  . Days of Exercise per Week: 7 days  . Minutes of Exercise per Session: 20 min  Stress: No Stress Concern Present  . Feeling of  Stress : Only a little  Social Connections: Unknown  . Frequency of Communication with Friends and Family: More than three times a week  . Frequency of Social Gatherings with Friends and Family: Not on file  . Attends Religious Services: Not on file  . Active Member of Clubs or Organizations: Not on file  . Attends Archivist Meetings: Not on file  . Marital Status: Not on file  Intimate Partner Violence: Not At Risk  . Fear of Current or Ex-Partner: No  . Emotionally Abused: No  . Physically Abused: No  . Sexually Abused: No    FAMILY HISTORY: Family History  Problem Relation Age of Onset  . Sudden death Mother   . Arthritis Father   . Stroke Father   . Breast cancer Sister   . Diabetes Grandchild      ALLERGIES:  is allergic to amoxicillin; gabapentin; mirtazapine; and other.  MEDICATIONS:  Current Outpatient Medications  Medication Sig Dispense Refill  . acetaminophen (TYLENOL) 500 MG tablet Take 1 tablet (500 mg total) by mouth every 6 (six) hours as needed. 30 tablet 0  . amLODipine (NORVASC) 10 MG tablet Take 1 tablet (10 mg total) by mouth daily. 90 tablet 3  . aspirin EC 81 MG tablet Take 1 tablet (81 mg total) by mouth daily. 30 tablet 5  . atorvastatin (LIPITOR) 40 MG tablet TAKE 1 TABLET BY MOUTH  DAILY 90 tablet 3  . blood glucose meter kit and supplies KIT Dispense based on patient and insurance preference. Use up to four times daily as directed. (FOR ICD-9 250.00, 250.01). 1 each 0  . Chromium-Cinnamon (CINNAMON PLUS CHROMIUM) 2193906282 MCG-MG CAPS Take 2 capsules by mouth daily.    . clopidogrel (PLAVIX) 75 MG tablet Take 1 tablet (75 mg total) by mouth daily. 90 tablet 3  . cyclobenzaprine (FLEXERIL) 5 MG tablet Take 1 tablet (5 mg total) by mouth at bedtime as needed for muscle spasms. 30 tablet 0  . fluticasone (FLONASE) 50 MCG/ACT nasal spray Place 1 spray into both nostrils daily. 16 g 5  . glucose blood (ONE TOUCH TEST STRIPS) test strip Use to test blood sugar twice daily 200 each 3  . hydrALAZINE (APRESOLINE) 10 MG tablet Take 10 mg by mouth 2 (two) times daily.    . Insulin Glargine (BASAGLAR KWIKPEN) 100 UNIT/ML SOPN Inject 0.06 mLs (6 Units total) into the skin daily. [facility] as of  01/21/2019    . Insulin Pen Needle (BD PEN NEEDLE NANO U/F) 32G X 4 MM MISC USE 1 DAILY 100 each 12  . lidocaine (LIDODERM) 5 % Place 1 patch onto the skin daily. Remove & Discard patch within 12 hours. 30 patch 1  . megestrol (MEGACE) 40 MG/ML suspension     . metoprolol tartrate (LOPRESSOR) 25 MG tablet TAKE 1 TABLET BY MOUTH TWO  TIMES DAILY 180 tablet 3  . Multiple Vitamin (MULTIVITAMIN) tablet Take 1 tablet by mouth daily.    . mupirocin ointment (BACTROBAN) 2 % Place 1  application into the nose 2 (two) times daily. 22 g 0  . nitroGLYCERIN (NITROSTAT) 0.4 MG SL tablet Place 1 tablet (0.4 mg total) under the tongue every 5 (five) minutes as needed for chest pain. 25 tablet 4  . ondansetron (ZOFRAN) 4 MG tablet Take 1 tablet (4 mg total) by mouth every 8 (eight) hours as needed for nausea or vomiting. 30 tablet 1  . ONE TOUCH LANCETS MISC Use as directed 2 times per  day 200 each 3  . pantoprazole (PROTONIX) 40 MG tablet Take 1 tablet (40 mg total) by mouth at bedtime. 30 tablet 3  . vitamin B-12 (CYANOCOBALAMIN) 1000 MCG tablet Take 1,000 mcg by mouth daily.    Marland Kitchen anastrozole (ARIMIDEX) 1 MG tablet Take 1 tablet by mouth once daily (Patient not taking: Reported on 10/25/2019) 90 tablet 0  . ferrous sulfate 325 (65 FE) MG EC tablet Take 1 tablet (325 mg total) by mouth 2 (two) times daily with a meal. 60 tablet 1  . senna (SENOKOT) 8.6 MG TABS tablet Take 2 tablets (17.2 mg total) by mouth daily. Hold if diarrhea. (Patient not taking: Reported on 10/25/2019) 30 tablet 1   No current facility-administered medications for this visit.     PHYSICAL EXAMINATION: ECOG PERFORMANCE STATUS: 2 - Symptomatic, <50% confined to bed Vitals:   10/25/19 1320  BP: (!) 171/68  Pulse: 67  Resp: 16  Temp: 97.6 F (36.4 C)   There were no vitals filed for this visit.  Physical Exam Constitutional:      General: She is not in acute distress.    Comments: She sits in the wheel chair.  Thin  built  HENT:     Head: Normocephalic and atraumatic.  Eyes:     General: No scleral icterus.    Pupils: Pupils are equal, round, and reactive to light.  Cardiovascular:     Rate and Rhythm: Normal rate and regular rhythm.     Heart sounds: Normal heart sounds.  Pulmonary:     Effort: Pulmonary effort is normal. No respiratory distress.     Breath sounds: No wheezing.     Comments: Decreased breath sound right lower lobe.  Good air entry on the left base.  Abdominal:     General:  Bowel sounds are normal. There is no distension.     Palpations: Abdomen is soft.  Musculoskeletal:        General: No deformity. Normal range of motion.     Cervical back: Normal range of motion and neck supple.     Comments: Left lower extremity history of amputation.   Skin:    General: Skin is warm and dry.     Findings: No erythema or rash.  Neurological:     Mental Status: She is alert and oriented to person, place, and time. Mental status is at baseline.     Cranial Nerves: No cranial nerve deficit.     Coordination: Coordination normal.  Psychiatric:        Mood and Affect: Mood normal.      LABORATORY DATA:  I have reviewed the data as listed Lab Results  Component Value Date   WBC 6.1 10/25/2019   HGB 10.0 (L) 10/25/2019   HCT 29.9 (L) 10/25/2019   MCV 89.0 10/25/2019   PLT 285 10/25/2019   Recent Labs    08/17/19 1256 09/13/19 1326 10/25/19 1247  NA 133* 135 134*  K 3.8 3.8 3.6  CL 103 107 106  CO2 19* 20* 20*  GLUCOSE 183* 220* 203*  BUN 30* 29* 31*  CREATININE 1.14* 1.30* 1.22*  CALCIUM 9.2 8.6* 8.7*  GFRNONAA 43* 36* 39*  GFRAA 49* 42* 45*  PROT 6.8 6.6 6.5  ALBUMIN 3.4* 3.4* 3.4*  AST 39 23 31  ALT 26 19 33  ALKPHOS 69 64 81  BILITOT 0.5 0.5 0.7   Iron/TIBC/Ferritin/ %Sat    Component Value Date/Time   IRON 68 09/13/2019 1326  TIBC 260 09/13/2019 1326   FERRITIN 62 09/13/2019 1326   IRONPCTSAT 26 09/13/2019 1326   IRONPCTSAT 27 06/19/2018 1006      RADIOGRAPHIC STUDIES: I have personally reviewed the radiological images as listed and agreed with the findings in the report. CT Head Wo Contrast  Result Date: 10/19/2019 CLINICAL DATA:  Fall last week with head injury. Posttraumatic headache. EXAM: CT HEAD WITHOUT CONTRAST TECHNIQUE: Contiguous axial images were obtained from the base of the skull through the vertex without intravenous contrast. COMPARISON:  11/10/2018 FINDINGS: Brain: Ventricles, cisterns and other CSF spaces are normal.  There is mild chronic ischemic microvascular disease. There is no mass, mass effect, shift of midline structures or acute hemorrhage. No evidence of acute infarction. Bilateral basal ganglia calcifications are present. Vascular: No hyperdense vessel or unexpected calcification. Skull: Normal. Negative for fracture or focal lesion. Sinuses/Orbits: No acute finding. Other: None. IMPRESSION: 1.  No acute findings. 2.  Mild chronic ischemic microvascular disease. Electronically Signed   By: Marin Olp M.D.   On: 10/19/2019 12:25   CT Chest Wo Contrast  Result Date: 10/19/2019 CLINICAL DATA:  Metastatic breast cancer, assess treatment response EXAM: CT CHEST WITHOUT CONTRAST TECHNIQUE: Multidetector CT imaging of the chest was performed following the standard protocol without IV contrast. COMPARISON:  CT chest, 07/26/2019, PET-CT, 02/25/2019 FINDINGS: Cardiovascular: Aortic atherosclerosis. Normal heart size. Extensive 3 vessel coronary artery calcifications and/or stents. No pericardial effusion. Mediastinum/Nodes: Interval improvement of numerous bulky mediastinal, lower cervical, right internal mammary, and left axillary/subpectoral lymph nodes, an index pretracheal node or conglomerate measuring 3.4 x 3.0 cm, previously 3.7 x 3.5 cm (series 2, image 53). Thyroid gland, trachea, and esophagus demonstrate no significant findings. Lungs/Pleura: There are numerous bilateral pleural masses and nodules, decreased in size compared to prior examination. An index mass of the anterior right lower lobe diaphoretic pleura measures 3.3 x 1.6 cm, previously 4.1 x 2.8 cm (series 2, image 116). Unchanged moderate right pleural effusion with associated atelectasis or consolidation. Essentially complete resolution of a previously seen solid pulmonary nodule of the lingula, this residual measuring approximately 2 mm in thickness, previously a rounded nodule measuring 7 mm (series 3, image 70). Multiple unchanged bilateral  ground-glass and subsolid pulmonary nodules, the largest subsolid pulmonary nodule of the central right upper lobe measuring 1.5 x 1.0 cm (series 8, image 63). Upper Abdomen: No acute abnormality. Musculoskeletal: Status post left mastectomy. IMPRESSION: 1. Interval improvement in numerous bilateral pulmonary and pleural masses and nodules. 2. Interval improvement in mediastinal, right internal mammary, lower cervical, and left axillary/subpectoral lymphadenopathy. 3.  Findings are consistent with treatment response. 4. Additional bilateral ground-glass and subsolid pulmonary nodules are unchanged, perhaps incidentally present infectious or inflammatory nodules although indolent synchronous primary lung adenocarcinoma remains a consideration. Attention on follow-up. 5. Unchanged moderate right pleural effusion associated atelectasis or consolidation. 6.  Coronary artery disease.  Aortic Atherosclerosis (ICD10-I70.0). Electronically Signed   By: Eddie Candle M.D.   On: 10/19/2019 15:22   DG Bone Density  Result Date: 10/20/2019 EXAM: DUAL X-RAY ABSORPTIOMETRY (DXA) FOR BONE MINERAL DENSITY IMPRESSION: Your patient Kiani Wurtzel completed a BMD test on 10/20/2019 using the [place] (software version: 14.10) manufactured by [employer]. The following summarizes the results of our evaluation. Technologist: SCE PATIENT BIOGRAPHICAL: Name: Nila, Winker Patient ID: [id number] Birth Date: 1927-09-09 Height: 60.0 in. Gender: Female Exam Date: 10/20/2019 Weight: 88.0 lbs. Indications: Advanced Age, Breast CA, Diabetic, Height Loss, High Risk Meds, History  of Fracture (Adult), Hysterectomy, Oophorectomy Bilateral, Osteoarthritis, Postmenopausal Fractures: coccyx Treatments: Arimidex, Insulin, Multi-Vitamin, Protonix DENSITOMETRY RESULTS: Site         Region     Measured Date Measured Age WHO Classification Young Adult T-score BMD         %Change vs. Previous Significant Change (*) DualFemur Total  Left 10/20/2019 89.6 Osteoporosis -5.6 0.304 g/cm2 - - Left Forearm Radius 33% 10/20/2019 89.6 Osteoporosis -4.2 0.509 g/cm2 - - ASSESSMENT: The BMD measured at Femur Total Left is 0.304 g/cm2 with a T-score of -5.6. This patient is considered osteoporotic according to [place] [facility]) criteria. The scan quality is good.Lumbar spine was not utilized due to advanced degenerative changes. World Pharmacologist [facility]) criteria for post-menopausal, Caucasian Women: Normal:                   T-score at or above -1 SD Osteopenia/low bone mass: T-score between -1 and -2.5 SD Osteoporosis:             T-score at or below -2.5 SD RECOMMENDATIONS: 1. All patients should optimize calcium and vitamin D intake. 2. Consider FDA-approved medical therapies in postmenopausal women and men aged 109 years and older, based on the following: a. A hip or vertebral(clinical or morphometric) fracture b. T-score < -2.5 at the femoral neck or spine after appropriate evaluation to exclude secondary causes c. Low bone mass (T-score between -1.0 and -2.5 at the femoral neck or spine) and a 10-year probability of a hip fracture > 3% or a 10-year probability of a major osteoporosis-related fracture > 20% based on the [place]-adapted WHO algorithm 3. Clinician judgment and/or patient preferences may indicate treatment for people with 10-year fracture probabilities above or below these levels FOLLOW-UP: People with diagnosed cases of osteoporosis or at high risk for fracture should have regular bone mineral density tests. For patients eligible for Medicare, routine testing is allowed once every 2 years. The testing frequency can be increased to one year for patients who have rapidly progressing disease, those who are receiving or discontinuing medical therapy to restore bone mass, or have additional risk factors. I have reviewed this report, and agree with the above findings. [facility] Electronically Signed   By:  Lowella Grip III M.D.   On: 10/20/2019 15:25      ASSESSMENT & PLAN:  1. Metastatic breast cancer ([place])   2. Use of anastrozole (Arimidex)   3. Anemia due to stage 3a chronic kidney disease   4. Osteopenia, unspecified location    #Metastatic breast cancer, CT chest abdomen pelvis images were independently reviewed by me and discussed with patient and her daughter. CT showed interval improvement of numerous bilateral pulmonary and pleural masses and nodules.  Interval improvement of mediastinal/right internal mammary/lower cervical/left axillary subpectoral lymphadenopathy.  Findings are consistent with good treatment response.  She has unchanged moderate right pleural effusion associated with atelectasis. Continue Arimidex.  Discussed with patient that given her advanced age, other comorbidities, and good response to single agent Arimidex, potential risk of CDK inhibitors, I recommend to continue Arimidex, hold off adding CDK inhibitors at this point.  CDK inhibitors can be considered as second line treatment option for her.  Osteopenia, bone density scan results were discussed with patient and daughter.  She has baseline osteopenia. Recommend patient to start calcium 1200 mg daily as well as vitamin D  800 units daily. Discussed at length the risk factors [ post-menopausal status, use of AI]; prevention/treatment strategies- including  but not limited to exercise calcium and vitamin D twice a day.Recommend prolia injections. Discussed the potential side effects including but not limited to- hypocalcemia and osteo necrosis of the jaw; discussed not to have invasive dental procedures few weeks around the time of the injections.  Awaiting dental clearance.  Chronic anemia secondary to CKD.  Hemoglobin at 10 today.  Stable.  Continue oral iron supplementation.  Orders Placed This Encounter  Procedures  . CBC with Differential/Platelet    Standing Status:   Future    Standing Expiration  Date:   12/08/2020  . Comprehensive metabolic panel    Standing Status:   Future    Standing Expiration Date:   12/08/2020    All questions were answered. The patient knows to call the clinic with any problems questions or concerns. Return of visit:  6 weeks.   [name], MD, PhD Hematology Oncology [facility] Pager- [phone] 10/25/2019

## 2019-10-25 NOTE — Telephone Encounter (Signed)
Dental clearance request letter faxed to dentist office.  Patient states she has an appt with them this week.

## 2019-11-04 ENCOUNTER — Other Ambulatory Visit: Payer: Self-pay

## 2019-11-04 ENCOUNTER — Encounter: Payer: Self-pay | Admitting: Nurse Practitioner

## 2019-11-04 ENCOUNTER — Other Ambulatory Visit: Payer: Medicare Other | Admitting: Nurse Practitioner

## 2019-11-04 DIAGNOSIS — Z515 Encounter for palliative care: Secondary | ICD-10-CM

## 2019-11-04 DIAGNOSIS — C50919 Malignant neoplasm of unspecified site of unspecified female breast: Secondary | ICD-10-CM

## 2019-11-04 DIAGNOSIS — Z139 Encounter for screening, unspecified: Secondary | ICD-10-CM | POA: Diagnosis not present

## 2019-11-04 NOTE — Progress Notes (Signed)
[facility] Consult Note Telephone: [phone]  Fax: [phone]  PATIENT NAME: Tiffany Bailey DOB: 03/20/1928 MRN: [id number]  PRIMARY CARE PROVIDER:   [facility]; Lauretta Grill NP  RESPONSIBLE PARTY:   self  RECOMMENDATIONS and PLAN: 1.ACP: Full code, ongoing family discussion;  2.Palliative care encounter; Palliative medicine team will continue to support patient, patient's family, and medical team. Visit consisted of counseling and education dealing with the complex and emotionally intense issues of symptom management and palliative care in the setting of serious and potentially life-threatening illness  I spent 60 minutes providing this consultation,  from 1:30pm to 2:30pm. More than 50% of the time in this consultation was spent coordinating communication.   HISTORY OF PRESENT ILLNESS:  Tiffany Bailey is a 86 y.o. year old female with multiple medical problems including Coronary artery disease is Plasti cabg with multiple pci's, myocardial infarction, carotid stenosis, hypertension, hyperlipidemia, history of breast s/p mastectomy cancer, chronic kidney disease, diabetes, arthritis, anemia, guard, adult failure to thrive, bilateral cataracts with phaco, bladder surgery, left above knee amputation, abdominal hysterectomy, abdominal tumor removed. In-person follow up palliative care visit. Tiffany Bailey and I talked about purpose of palliative care visit and in agreement. We talked about Primary Care visit with Lauretta Grill NP primary care was leaving as I arrived. We talked about collaboration of care. We review Dr. Tasia Catchings Oncology note to continue Arimidex with improved results and decreasing size. Tiffany Bailey and I talked about side effects for which she currently is not experiencing. We talked about Zofran for nausea. We talked about pain, noted Lidoderm patches were ordered. We talked about Lidoderm patches being over the counter. We  talked about upcoming appointment with Dr Lucky Cowboy vascular for left phantom pain. We talked about appetite which has improved. We talked about her weight for which she's gained. Praised Tiffany. Bailey for gaining weight as she continues to take Megace. We talked about medical goals of care. We talked about CPR. Tiffany Bailey endorses that she and her family continue to discuss and can revisit at next palliative visit. We talked about role of palliative care and plan of care. This phrase talked about family dynamics, for adult children helping her in addition to family support. We talked about coping strategies. Therapeutic listening and emotional support provided. Contact information. We talked about follow-up palliative care visit in six weeks if needed or sooner should she declined. Tiffany. Bailey in agreement an appointment scheduled. Palliative Care was asked to help to continue to address goals of care.   CODE STATUS: Full code  PPS: 50% HOSPICE ELIGIBILITY/DIAGNOSIS: TBD  PAST MEDICAL HISTORY:  Past Medical History:  Diagnosis Date  . Anemia   . Anginal pain ([place])   . Arthritis   . Breast cancer ([place]) 07/19/2019  . Cancer (HCC)    BREAST  . CKD (chronic kidney disease), stage III   . Coronary artery disease    S/P CABG and multiple PCI's  . Diabetes mellitus without complication ([place])   . Edema   . Failure to thrive in adult   . GERD (gastroesophageal reflux disease)   . 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 History of breast cancer   . HOH (hard of hearing)   . Hyperlipidemia   . Hypertension   . Myocardial infarction ([place])   . Palpitations     SOCIAL HX:  Social History   Tobacco Use  . Smoking status: Never Smoker  .  Smokeless tobacco: Never Used  Substance Use Topics  . Alcohol use: No    ALLERGIES:  Allergies  Allergen Reactions  . Amoxicillin Hives and Nausea Only  . Gabapentin Other (See Comments)    AMS Confusion  . Mirtazapine Itching    Dry mouth with  ODT Denies SOB with medication  . Other     UNKNOWN PAIN MEDICATION - unknown reaction     PERTINENT MEDICATIONS:  Outpatient Encounter Medications as of 11/04/2019  Medication Sig  . acetaminophen (TYLENOL) 500 MG tablet Take 1 tablet (500 mg total) by mouth every 6 (six) hours as needed.  Marland Kitchen amLODipine (NORVASC) 10 MG tablet Take 1 tablet (10 mg total) by mouth daily.  Marland Kitchen anastrozole (ARIMIDEX) 1 MG tablet Take 1 tablet by mouth once daily (Patient not taking: Reported on 10/25/2019)  . aspirin EC 81 MG tablet Take 1 tablet (81 mg total) by mouth daily.  Marland Kitchen atorvastatin (LIPITOR) 40 MG tablet TAKE 1 TABLET BY MOUTH  DAILY  . blood glucose meter kit and supplies KIT Dispense based on patient and insurance preference. Use up to four times daily as directed. (FOR ICD-9 250.00, 250.01).  . Chromium-Cinnamon (CINNAMON PLUS CHROMIUM) [phone] MCG-MG CAPS Take 2 capsules by mouth daily.  . clopidogrel (PLAVIX) 75 MG tablet Take 1 tablet (75 mg total) by mouth daily.  . cyclobenzaprine (FLEXERIL) 5 MG tablet Take 1 tablet (5 mg total) by mouth at bedtime as needed for muscle spasms.  . ferrous sulfate 325 (65 FE) MG EC tablet Take 1 tablet (325 mg total) by mouth 2 (two) times daily with a meal.  . fluticasone (FLONASE) 50 MCG/ACT nasal spray Place 1 spray into both nostrils daily.  Marland Kitchen glucose blood (ONE TOUCH TEST STRIPS) test strip Use to test blood sugar twice daily  . hydrALAZINE (APRESOLINE) 10 MG tablet Take 10 mg by mouth 2 (two) times daily.  . Insulin Glargine (BASAGLAR KWIKPEN) 100 UNIT/ML SOPN Inject 0.06 mLs (6 Units total) into the skin daily. [place] endocrine as of  01/21/2019  . Insulin Pen Needle (BD PEN NEEDLE NANO U/F) 32G X 4 MM MISC USE 1 DAILY  . lidocaine (LIDODERM) 5 % Place 1 patch onto the skin daily. Remove & Discard patch within 12 hours.  . megestrol (MEGACE) 40 MG/ML suspension   . metoprolol tartrate (LOPRESSOR) 25 MG tablet TAKE 1 TABLET BY MOUTH TWO  TIMES DAILY  . Multiple  Vitamin (MULTIVITAMIN) tablet Take 1 tablet by mouth daily.  . mupirocin ointment (BACTROBAN) 2 % Place 1 application into the nose 2 (two) times daily.  . nitroGLYCERIN (NITROSTAT) 0.4 MG SL tablet Place 1 tablet (0.4 mg total) under the tongue every 5 (five) minutes as needed for chest pain.  Marland Kitchen ondansetron (ZOFRAN) 4 MG tablet Take 1 tablet (4 mg total) by mouth every 8 (eight) hours as needed for nausea or vomiting.  . ONE TOUCH LANCETS MISC Use as directed 2 times per day  . pantoprazole (PROTONIX) 40 MG tablet Take 1 tablet (40 mg total) by mouth at bedtime.  . senna (SENOKOT) 8.6 MG TABS tablet Take 2 tablets (17.2 mg total) by mouth daily. Hold if diarrhea. (Patient not taking: Reported on 10/25/2019)  . vitamin B-12 (CYANOCOBALAMIN) 1000 MCG tablet Take 1,000 mcg by mouth daily.   No facility-administered encounter medications on file as of 11/04/2019.    PHYSICAL EXAM:   General: NAD, frail appearing, thin, pleasant female Cardiovascular: regular rate and rhythm Pulmonary: clear ant fields Neurological:  w/c dependent  Shailey Butterbaugh Ihor Gully, NP

## 2019-11-09 DIAGNOSIS — S3991XA Unspecified injury of abdomen, initial encounter: Secondary | ICD-10-CM | POA: Diagnosis not present

## 2019-11-09 DIAGNOSIS — E119 Type 2 diabetes mellitus without complications: Secondary | ICD-10-CM | POA: Diagnosis not present

## 2019-11-09 DIAGNOSIS — R918 Other nonspecific abnormal finding of lung field: Secondary | ICD-10-CM | POA: Diagnosis not present

## 2019-11-09 DIAGNOSIS — S0011XA Contusion of right eyelid and periocular area, initial encounter: Secondary | ICD-10-CM | POA: Diagnosis not present

## 2019-11-09 DIAGNOSIS — R198 Other specified symptoms and signs involving the digestive system and abdomen: Secondary | ICD-10-CM | POA: Diagnosis not present

## 2019-11-09 DIAGNOSIS — M546 Pain in thoracic spine: Secondary | ICD-10-CM | POA: Diagnosis not present

## 2019-11-09 DIAGNOSIS — S199XXA Unspecified injury of neck, initial encounter: Secondary | ICD-10-CM | POA: Diagnosis not present

## 2019-11-09 DIAGNOSIS — S0001XA Abrasion of scalp, initial encounter: Secondary | ICD-10-CM | POA: Diagnosis not present

## 2019-11-09 DIAGNOSIS — M47816 Spondylosis without myelopathy or radiculopathy, lumbar region: Secondary | ICD-10-CM | POA: Diagnosis not present

## 2019-11-09 DIAGNOSIS — Z743 Need for continuous supervision: Secondary | ICD-10-CM | POA: Diagnosis not present

## 2019-11-09 DIAGNOSIS — S0993XA Unspecified injury of face, initial encounter: Secondary | ICD-10-CM | POA: Diagnosis not present

## 2019-11-09 DIAGNOSIS — I10 Essential (primary) hypertension: Secondary | ICD-10-CM | POA: Diagnosis not present

## 2019-11-09 DIAGNOSIS — E1165 Type 2 diabetes mellitus with hyperglycemia: Secondary | ICD-10-CM | POA: Diagnosis not present

## 2019-11-09 DIAGNOSIS — S299XXA Unspecified injury of thorax, initial encounter: Secondary | ICD-10-CM | POA: Diagnosis not present

## 2019-11-09 DIAGNOSIS — J90 Pleural effusion, not elsewhere classified: Secondary | ICD-10-CM | POA: Diagnosis not present

## 2019-11-09 DIAGNOSIS — R609 Edema, unspecified: Secondary | ICD-10-CM | POA: Diagnosis not present

## 2019-11-09 DIAGNOSIS — S3993XA Unspecified injury of pelvis, initial encounter: Secondary | ICD-10-CM | POA: Diagnosis not present

## 2019-11-09 DIAGNOSIS — E876 Hypokalemia: Secondary | ICD-10-CM | POA: Diagnosis not present

## 2019-11-09 DIAGNOSIS — Z794 Long term (current) use of insulin: Secondary | ICD-10-CM | POA: Diagnosis not present

## 2019-11-09 DIAGNOSIS — M545 Low back pain: Secondary | ICD-10-CM | POA: Diagnosis not present

## 2019-11-09 DIAGNOSIS — S3992XA Unspecified injury of lower back, initial encounter: Secondary | ICD-10-CM | POA: Diagnosis not present

## 2019-11-09 DIAGNOSIS — S0990XA Unspecified injury of head, initial encounter: Secondary | ICD-10-CM | POA: Diagnosis not present

## 2019-11-09 DIAGNOSIS — R52 Pain, unspecified: Secondary | ICD-10-CM | POA: Diagnosis not present

## 2019-11-11 ENCOUNTER — Other Ambulatory Visit (INDEPENDENT_AMBULATORY_CARE_PROVIDER_SITE_OTHER): Payer: Self-pay | Admitting: Nurse Practitioner

## 2019-11-11 DIAGNOSIS — I739 Peripheral vascular disease, unspecified: Secondary | ICD-10-CM

## 2019-11-11 MED ORDER — SODIUM CHLORIDE FLUSH 0.9 % IV SOLN
5.00 | INTRAVENOUS | Status: DC
Start: 2019-11-09 — End: 2019-11-11

## 2019-11-12 ENCOUNTER — Ambulatory Visit (INDEPENDENT_AMBULATORY_CARE_PROVIDER_SITE_OTHER): Payer: Medicare Other | Admitting: Nurse Practitioner

## 2019-11-12 ENCOUNTER — Other Ambulatory Visit: Payer: Self-pay

## 2019-11-12 ENCOUNTER — Ambulatory Visit (INDEPENDENT_AMBULATORY_CARE_PROVIDER_SITE_OTHER): Payer: Medicare Other

## 2019-11-12 ENCOUNTER — Encounter (INDEPENDENT_AMBULATORY_CARE_PROVIDER_SITE_OTHER): Payer: Self-pay | Admitting: Nurse Practitioner

## 2019-11-12 VITALS — BP 165/75 | HR 79 | Resp 16 | Wt 93.0 lb

## 2019-11-12 DIAGNOSIS — R200 Anesthesia of skin: Secondary | ICD-10-CM | POA: Diagnosis not present

## 2019-11-12 DIAGNOSIS — Z794 Long term (current) use of insulin: Secondary | ICD-10-CM

## 2019-11-12 DIAGNOSIS — I739 Peripheral vascular disease, unspecified: Secondary | ICD-10-CM

## 2019-11-12 DIAGNOSIS — I10 Essential (primary) hypertension: Secondary | ICD-10-CM

## 2019-11-12 DIAGNOSIS — E1159 Type 2 diabetes mellitus with other circulatory complications: Secondary | ICD-10-CM | POA: Diagnosis not present

## 2019-11-12 DIAGNOSIS — R202 Paresthesia of skin: Secondary | ICD-10-CM | POA: Diagnosis not present

## 2019-11-17 ENCOUNTER — Encounter (INDEPENDENT_AMBULATORY_CARE_PROVIDER_SITE_OTHER): Payer: Self-pay | Admitting: Nurse Practitioner

## 2019-11-17 NOTE — Progress Notes (Signed)
Subjective:    Patient ID: Tiffany Bailey, female    DOB: May 05, 1928, 86 y.o.   MRN: [id number] Chief Complaint  Patient presents with  . Follow-up    ultrasound follow up    The patient returns to the office for followup and review of the noninvasive studies. There have been no interval changes in lower extremity symptoms. No interval shortening of the patient's claudication distance or development of rest pain symptoms. No new ulcers or wounds have occurred since the last visit.  The patient's only major complaint includes numbness and tingling of her lower extremities.  She also describes a burning sensation.  This is not only in her amputated limb but also in her right lower extremity as well.  She denies any open wounds or ulcerations.  There have been no significant changes to the patient's overall health care.  The patient denies amaurosis fugax or recent TIA symptoms. There are no recent neurological changes noted. The patient denies history of DVT, PE or superficial thrombophlebitis. The patient denies recent episodes of angina or shortness of breath.   ABI Rt=0.93 and Lt=N/A  (previous ABI's Rt=1.05 and Lt=n/a) Duplex ultrasound of the right tibial artery the patient has monophasic/biphasic waveforms with acceptable toe waveforms.  The patient's TBI 0.56.   Review of Systems  Neurological: Positive for weakness and numbness.  All other systems reviewed and are negative.      Objective:   Physical Exam Vitals reviewed.  HENT:     Head: Normocephalic.  Cardiovascular:     Rate and Rhythm: Normal rate and regular rhythm.     Pulses: Normal pulses.     Heart sounds: Normal heart sounds.  Pulmonary:     Effort: Pulmonary effort is normal.     Breath sounds: Normal breath sounds.  Musculoskeletal:     Left Lower Extremity: Left leg is amputated above knee.  Skin:    General: Skin is warm and dry.  Neurological:     Mental Status: She is alert and oriented to  person, place, and time.  Psychiatric:        Mood and Affect: Mood normal.        Behavior: Behavior normal.        Thought Content: Thought content normal.        Judgment: Judgment normal.     BP (!) 165/75 (BP Location: Right Arm)   Pulse 79   Resp 16   Wt 93 lb (42.2 kg)   BMI 18.16 kg/m   Past Medical History:  Diagnosis Date  . Anemia   . Anginal pain ([place])   . Arthritis   . Breast cancer ([place]) 07/19/2019  . Cancer (HCC)    BREAST  . CKD (chronic kidney disease), stage III   . Coronary artery disease    S/P CABG and multiple PCI's  . Diabetes mellitus without complication ([place])   . Edema   . Failure to thrive in adult   . GERD (gastroesophageal reflux disease)   . 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 History of breast cancer   . HOH (hard of hearing)   . Hyperlipidemia   . Hypertension   . Myocardial infarction ([place])   . Palpitations     Social History   Socioeconomic History  . Marital status: Divorced    Spouse name: Not on file  . Number of children: 7  . Years of education: Not on file  . Highest education level: Not on  file  Occupational History  . 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 Smoking status: Never Smoker  . Smokeless tobacco: Never Used  Substance and Sexual Activity  . Alcohol use: No  . Drug use: No  . Sexual activity: Not Currently  Other Topics Concern  . Not on file  Social History Narrative  . Not on file   Social Determinants of Health   Financial Resource Strain: Low Risk   . Difficulty of Paying Living Expenses: Not hard at all  Food Insecurity: No Food Insecurity  . Worried About Charity fundraiser in the Last Year: Never true  . Ran Out of Food in the Last Year: Never true  Transportation Needs: No Transportation Needs  . Lack of Transportation (Medical): No  . Lack of Transportation (Non-Medical): No  Physical Activity: Insufficiently Active  . Days of Exercise per Week: 7  days  . Minutes of Exercise per Session: 20 min  Stress: No Stress Concern Present  . Feeling of Stress : Only a little  Social Connections: Unknown  . Frequency of Communication with Friends and Family: More than three times a week  . Frequency of Social Gatherings with Friends and Family: Not on file  . Attends Religious Services: Not on file  . Active Member of Clubs or Organizations: Not on file  . Attends Archivist Meetings: Not on file  . Marital Status: Not on file  Intimate Partner Violence: Not At Risk  . Fear of Current or Ex-Partner: No  . Emotionally Abused: No  . Physically Abused: No  . Sexually Abused: No    Past Surgical History:  Procedure Laterality Date  . ABDOMINAL HYSTERECTOMY    . ABOVE KNEE LEG AMPUTATION Left 2013  . ANTERIOR VITRECTOMY Left 11/16/2015   Procedure: ANTERIOR VITRECTOMY;  Surgeon: Eulogio Bear, MD;  Location: [facility] ORS;  Service: Ophthalmology;  Laterality: Left;  . BLADDER SURGERY    . CATARACT EXTRACTION W/PHACO Left 11/16/2015   Procedure: CATARACT EXTRACTION PHACO AND INTRAOCULAR LENS PLACEMENT (IOC);  Surgeon: Eulogio Bear, MD;  Location: [facility] OR[facility];  Service: Ophthalmology;  Laterality: Left;  Lot # [id number] H [place]; 01:22.8 AP%:12.6 CDE: 10.46  . CATARACT EXTRACTION W/PHACO Right 08/22/2016   Procedure: CATARACT EXTRACTION PHACO AND INTRAOCULAR LENS PLACEMENT (IOC);  Surgeon: Eulogio Bear, MD;  Location: [facility] ORS;  Service: Ophthalmology;  Laterality: Right;  Lot # [id number] H [place]: 00:52.1 AP%:8.7 CDE: 4.99   . CORONARY ANGIOPLASTY     STENT  . CORONARY ARTERY BYPASS GRAFT    . LOWER EXTREMITY ANGIOGRAPHY Right 01/14/2019   Procedure: LOWER EXTREMITY ANGIOGRAPHY;  Surgeon: Algernon Huxley, MD;  Location: [facility];  Service: Cardiovascular;  Laterality: Right;  . MASTECTOMY    . TUMOR REMOVAL     ABDOMINAL    Family History  Problem Relation Age of Onset  . Sudden death Mother   . Arthritis Father   . Stroke  Father   . Breast cancer Sister   . Diabetes Grandchild     Allergies  Allergen Reactions  . Amoxicillin Hives and Nausea Only  . Gabapentin Other (See Comments)    AMS Confusion  . Mirtazapine Itching    Dry mouth with ODT Denies SOB with medication  . Other     UNKNOWN PAIN MEDICATION - unknown reaction       Assessment & Plan:   1. PAD (peripheral artery disease) ([place])  Recommend:  The patient has evidence of atherosclerosis of the lower extremities with claudication.  The patient does not voice lifestyle limiting changes at this point in time.  Noninvasive studies do not suggest clinically significant change.  No invasive studies, angiography or surgery at this time The patient should continue walking and begin a more formal exercise program.  The patient should continue antiplatelet therapy and aggressive treatment of the lipid abnormalities  No changes in the patient's medications at this time  The patient should continue wearing graduated compression socks 10-15 mmHg strength to control the mild edema.   Patient will follow up on an annual basis, sooner if issues should arise.   2. Essential hypertension Continue antihypertensive medications as already ordered, these medications have been reviewed and there are no changes at this time.   3. Numbness and tingling The patient does endorse having some burning, numbness and tingling in her right lower extremity, as well as some phantom limb pains.  The patient has previously tried gabapentin however it was not useful.  We will place referral to the patient for neurology for possible treatment options of neuropathic-like symptoms. - Ambulatory referral to Neurology  4. Type 2 diabetes mellitus with other circulatory complication, with long-term current use of insulin (HCC) Continue hypoglycemic medications as already ordered, these medications have been reviewed and there are no changes at this time.  Hgb A1C to be  monitored as already arranged by primary service    Current Outpatient Medications on File Prior to Visit  Medication Sig Dispense Refill  . acetaminophen (TYLENOL) 500 MG tablet Take 1 tablet (500 mg total) by mouth every 6 (six) hours as needed. 30 tablet 0  . amLODipine (NORVASC) 10 MG tablet Take 1 tablet (10 mg total) by mouth daily. 90 tablet 3  . aspirin EC 81 MG tablet Take 1 tablet (81 mg total) by mouth daily. 30 tablet 5  . atorvastatin (LIPITOR) 40 MG tablet TAKE 1 TABLET BY MOUTH  DAILY 90 tablet 3  . blood glucose meter kit and supplies KIT Dispense based on patient and insurance preference. Use up to four times daily as directed. (FOR ICD-9 250.00, 250.01). 1 each 0  . Chromium-Cinnamon (CINNAMON PLUS CHROMIUM) [phone] MCG-MG CAPS Take 2 capsules by mouth daily.    . clopidogrel (PLAVIX) 75 MG tablet Take 1 tablet (75 mg total) by mouth daily. 90 tablet 3  . cyclobenzaprine (FLEXERIL) 5 MG tablet Take 1 tablet (5 mg total) by mouth at bedtime as needed for muscle spasms. 30 tablet 0  . ferrous sulfate 325 (65 FE) MG EC tablet Take 1 tablet (325 mg total) by mouth 2 (two) times daily with a meal. 60 tablet 1  . fluticasone (FLONASE) 50 MCG/ACT nasal spray Place 1 spray into both nostrils daily. 16 g 5  . glucose blood (ONE TOUCH TEST STRIPS) test strip Use to test blood sugar twice daily 200 each 3  . hydrALAZINE (APRESOLINE) 10 MG tablet Take 10 mg by mouth 2 (two) times daily.    . Insulin Glargine (BASAGLAR KWIKPEN) 100 UNIT/ML SOPN Inject 0.06 mLs (6 Units total) into the skin daily. [place] endocrine as of  01/21/2019    . Insulin Pen Needle (BD PEN NEEDLE NANO U/F) 32G X 4 MM MISC USE 1 DAILY 100 each 12  . lidocaine (LIDODERM) 5 % Place 1 patch onto the skin daily. Remove & Discard patch within 12 hours. 30 patch 1  . megestrol (MEGACE) 40 MG/ML suspension     .  metoprolol tartrate (LOPRESSOR) 25 MG tablet TAKE 1 TABLET BY MOUTH TWO  TIMES DAILY 180 tablet 3  . Multiple Vitamin  (MULTIVITAMIN) tablet Take 1 tablet by mouth daily.    . mupirocin ointment (BACTROBAN) 2 % Place 1 application into the nose 2 (two) times daily. 22 g 0  . nitroGLYCERIN (NITROSTAT) 0.4 MG SL tablet Place 1 tablet (0.4 mg total) under the tongue every 5 (five) minutes as needed for chest pain. 25 tablet 4  . ondansetron (ZOFRAN) 4 MG tablet Take 1 tablet (4 mg total) by mouth every 8 (eight) hours as needed for nausea or vomiting. 30 tablet 1  . ONE TOUCH LANCETS MISC Use as directed 2 times per day 200 each 3  . pantoprazole (PROTONIX) 40 MG tablet Take 1 tablet (40 mg total) by mouth at bedtime. 30 tablet 3  . vitamin B-12 (CYANOCOBALAMIN) 1000 MCG tablet Take 1,000 mcg by mouth daily.    Marland Kitchen anastrozole (ARIMIDEX) 1 MG tablet Take 1 tablet by mouth once daily (Patient not taking: Reported on 10/25/2019) 90 tablet 0  . senna (SENOKOT) 8.6 MG TABS tablet Take 2 tablets (17.2 mg total) by mouth daily. Hold if diarrhea. (Patient not taking: Reported on 10/25/2019) 30 tablet 1   No current facility-administered medications on file prior to visit.    There are no Patient Instructions on file for this visit. No follow-ups on file.   Kris Hartmann, NP

## 2019-11-22 DIAGNOSIS — E782 Mixed hyperlipidemia: Secondary | ICD-10-CM | POA: Diagnosis not present

## 2019-11-22 DIAGNOSIS — E1165 Type 2 diabetes mellitus with hyperglycemia: Secondary | ICD-10-CM | POA: Diagnosis not present

## 2019-11-22 DIAGNOSIS — M810 Age-related osteoporosis without current pathological fracture: Secondary | ICD-10-CM | POA: Diagnosis not present

## 2019-11-22 DIAGNOSIS — Z794 Long term (current) use of insulin: Secondary | ICD-10-CM | POA: Diagnosis not present

## 2019-11-22 DIAGNOSIS — E1122 Type 2 diabetes mellitus with diabetic chronic kidney disease: Secondary | ICD-10-CM | POA: Diagnosis not present

## 2019-12-02 DIAGNOSIS — Z139 Encounter for screening, unspecified: Secondary | ICD-10-CM | POA: Diagnosis not present

## 2019-12-04 ENCOUNTER — Encounter: Payer: Self-pay | Admitting: Emergency Medicine

## 2019-12-04 ENCOUNTER — Other Ambulatory Visit: Payer: Self-pay

## 2019-12-04 DIAGNOSIS — I129 Hypertensive chronic kidney disease with stage 1 through stage 4 chronic kidney disease, or unspecified chronic kidney disease: Secondary | ICD-10-CM | POA: Diagnosis not present

## 2019-12-04 DIAGNOSIS — Z79899 Other long term (current) drug therapy: Secondary | ICD-10-CM | POA: Diagnosis not present

## 2019-12-04 DIAGNOSIS — Z7984 Long term (current) use of oral hypoglycemic drugs: Secondary | ICD-10-CM | POA: Insufficient documentation

## 2019-12-04 DIAGNOSIS — R519 Headache, unspecified: Secondary | ICD-10-CM | POA: Diagnosis not present

## 2019-12-04 DIAGNOSIS — Z7982 Long term (current) use of aspirin: Secondary | ICD-10-CM | POA: Insufficient documentation

## 2019-12-04 DIAGNOSIS — I2510 Atherosclerotic heart disease of native coronary artery without angina pectoris: Secondary | ICD-10-CM | POA: Insufficient documentation

## 2019-12-04 DIAGNOSIS — E1122 Type 2 diabetes mellitus with diabetic chronic kidney disease: Secondary | ICD-10-CM | POA: Diagnosis not present

## 2019-12-04 DIAGNOSIS — Z951 Presence of aortocoronary bypass graft: Secondary | ICD-10-CM | POA: Insufficient documentation

## 2019-12-04 DIAGNOSIS — N183 Chronic kidney disease, stage 3 unspecified: Secondary | ICD-10-CM | POA: Insufficient documentation

## 2019-12-04 NOTE — ED Triage Notes (Signed)
Patient with complaint of headache times two days. Patient states that she thinks that it is from her sinuses. Patient states that she has had similar headaches in the past.

## 2019-12-05 ENCOUNTER — Emergency Department
Admission: EM | Admit: 2019-12-05 | Discharge: 2019-12-05 | Disposition: A | Discharge status: Home / Self Care | Payer: Medicare Other | Attending: Emergency Medicine | Admitting: Emergency Medicine

## 2019-12-05 DIAGNOSIS — R519 Headache, unspecified: Secondary | ICD-10-CM

## 2019-12-05 MED ORDER — IPRATROPIUM BROMIDE 0.03 % NA SOLN: 2.0000 | Freq: Two times a day (BID) | NASAL | 12 refills | Status: DC

## 2019-12-05 MED ORDER — SALINE SPRAY 0.65 % NA SOLN
1.0000 | NASAL | Status: DC | PRN
Start: 2019-12-05 — End: 2020-01-20

## 2019-12-05 NOTE — Discharge Instructions (Addendum)
Use saline drops in your nose several times a day to help with congestion. Use ipratropium 2 sprays twice a day for the next 2 days to help with your congestion.  Return to the emergency room if you have changes in vision, if your headache recurs, if you have new headache, if you have eye pain, or any other symptoms concerning to you.

## 2019-12-05 NOTE — ED Notes (Signed)
Patient in no acute distress asking about wait time. Patient given update on wait time. Patient verbalizes understanding.  

## 2019-12-05 NOTE — ED Provider Notes (Signed)
[facility] Emergency Department Provider Note  ____________________________________________  Time seen: Approximately 2:54 AM  I have reviewed the triage vital signs and the nursing notes.   HISTORY  Chief Complaint Headache   HPI Tiffany Bailey is a 86 y.o. female with a history of chronic headaches, diabetes, chronic kidney disease, CAD, hypertension, hyperlipidemia who presents for evaluation of headache.  Patient reports 2 days of nasal congestion and left-sided facial headache.  The headache started mild and became more severe throughout the last 2 days.  She took 2 Tylenol before coming in and reports that her headache is fully resolved at this time.  No thunderclap headache, no temporal artery tenderness or pain, no jaw claudication, no changes in vision, no fever or chills, no neck stiffness, no photophobia or phonophobia, no head trauma, no syncope, no dizziness, no slurred speech, no unilateral weakness or numbness, no facial droop.     Past Medical History:  Diagnosis Date  . Anemia   . Anginal pain ([place])   . Arthritis   . Breast cancer ([place]) 07/19/2019  . Cancer (HCC)    BREAST  . CKD (chronic kidney disease), stage III   . Coronary artery disease    S/P CABG and multiple PCI's  . Diabetes mellitus without complication ([place])   . Edema   . Failure to thrive in adult   . GERD (gastroesophageal reflux disease)   . 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 History of breast cancer   . HOH (hard of hearing)   . Hyperlipidemia   . Hypertension   . Myocardial infarction ([place])   . Palpitations     Patient Active Problem List   Diagnosis Date Noted  . Osteopenia 10/25/2019  . Use of anastrozole (Arimidex) 08/18/2019  . Anemia due to stage 3a chronic kidney disease 08/18/2019  . 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 Nausea 08/03/2019  . Breast cancer ([place]) 07/19/2019  . Goals of care, counseling/discussion 07/19/2019  . Lung mass  02/10/2019  . Constipation 01/27/2019  . Headache 01/25/2019  . Right leg swelling 01/25/2019  . Metastatic breast cancer ([place]) 01/05/2019  . Anxiety and depression 12/16/2018  . Hx of AKA (above knee amputation), left ([place]) 08/26/2018  . Left arm swelling 07/01/2018  . Carotid stenosis 12/19/2017  . CAD (coronary artery disease) 10/23/2017  . 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 History of CHF (congestive heart failure) 10/23/2017  . Old myocardial infarction 10/23/2017  . Hand cramp 10/17/2017  . Acute non-recurrent maxillary sinusitis 10/17/2017  . Skin lesion 09/29/2017  . Diarrhea 04/23/2017  . Weight loss 09/12/2016  . CKD stage 3 due to type 2 diabetes mellitus ([place]) 08/14/2016  . PAD (peripheral artery disease) ([place]) 08/14/2016  . DM (diabetes mellitus) ([place]) 03/25/2016  . Anemia 03/25/2016  . Chronic tension-type headache, not intractable 03/25/2016  . Essential hypertension 03/25/2016  . CAD (coronary artery disease), native coronary artery 03/25/2016  . GERD (gastroesophageal reflux disease) 03/25/2016  . Hyperlipidemia 03/25/2016  . History of breast cancer 03/25/2016  . AKI (acute kidney injury) ([place]) 01/28/2016  . Hyperkalemia 01/28/2016  . Hyponatremia 01/28/2016  . Urinary retention 01/28/2016  . Hypertensive urgency 01/15/2016  . History of nonadherence to medical treatment 09/08/2015  . Epigastric pain 08/10/2014  . Ischemic heart disease due to coronary artery obstruction ([place]) 01/07/2013    Past Surgical History:  Procedure Laterality Date  . ABDOMINAL HYSTERECTOMY    . ABOVE KNEE LEG AMPUTATION Left  2013  . ANTERIOR VITRECTOMY Left 11/16/2015   Procedure: ANTERIOR VITRECTOMY;  Surgeon: Eulogio Bear, MD;  Location: [facility] ORS;  Service: Ophthalmology;  Laterality: Left;  . BLADDER SURGERY    . CATARACT EXTRACTION W/PHACO Left 11/16/2015   Procedure: CATARACT EXTRACTION PHACO AND INTRAOCULAR LENS PLACEMENT (IOC);  Surgeon: Eulogio Bear,  MD;  Location: [facility] ORS;  Service: Ophthalmology;  Laterality: Left;  Lot # [id number] H [place]; 01:22.8 AP%:12.6 CDE: 10.46  . CATARACT EXTRACTION W/PHACO Right 08/22/2016   Procedure: CATARACT EXTRACTION PHACO AND INTRAOCULAR LENS PLACEMENT (IOC);  Surgeon: Eulogio Bear, MD;  Location: [facility] ORS;  Service: Ophthalmology;  Laterality: Right;  Lot # [id number] H [place]: 00:52.1 AP%:8.7 CDE: 4.99   . CORONARY ANGIOPLASTY     STENT  . CORONARY ARTERY BYPASS GRAFT    . LOWER EXTREMITY ANGIOGRAPHY Right 01/14/2019   Procedure: LOWER EXTREMITY ANGIOGRAPHY;  Surgeon: Algernon Huxley, MD;  Location: [facility];  Service: Cardiovascular;  Laterality: Right;  . MASTECTOMY    . TUMOR REMOVAL     ABDOMINAL    Prior to Admission medications   Medication Sig Start Date End Date Taking? Authorizing Provider  acetaminophen (TYLENOL) 500 MG tablet Take 1 tablet (500 mg total) by mouth every 6 (six) hours as needed. 01/15/17   [name], DO  amLODipine (NORVASC) 10 MG tablet Take 1 tablet (10 mg total) by mouth daily. 11/24/18   Minna Merritts, MD  anastrozole (ARIMIDEX) 1 MG tablet Take 1 tablet by mouth once daily Patient not taking: Reported on 10/25/2019 10/11/19   [name], MD  aspirin EC 81 MG tablet Take 1 tablet (81 mg total) by mouth daily. 01/15/17   [name], DO  atorvastatin (LIPITOR) 40 MG tablet TAKE 1 TABLET BY MOUTH  DAILY 06/21/19   [name], FNP  blood glucose meter kit and supplies KIT Dispense based on patient and insurance preference. Use up to four times daily as directed. (FOR ICD-9 250.00, 250.01). 01/13/17   [name], DO  Chromium-Cinnamon (CINNAMON PLUS CHROMIUM) 6195013524 MCG-MG CAPS Take 2 capsules by mouth daily.    [provider]  clopidogrel (PLAVIX) 75 MG tablet Take 1 tablet (75 mg total) by mouth daily. 11/24/18   Minna Merritts, MD  cyclobenzaprine (FLEXERIL) 5 MG tablet Take 1 tablet (5 mg total) by mouth at bedtime as needed for muscle spasms.  08/26/18   [name], FNP  ferrous sulfate 325 (65 FE) MG EC tablet Take 1 tablet (325 mg total) by mouth 2 (two) times daily with a meal. 08/17/19   [name], MD  fluticasone [facility]) 50 MCG/ACT nasal spray Place 1 spray into both nostrils daily. 07/08/18   [name], FNP  glucose blood (ONE TOUCH TEST STRIPS) test strip Use to test blood sugar twice daily 09/17/17   [name], FNP  hydrALAZINE (APRESOLINE) 10 MG tablet Take 10 mg by mouth 2 (two) times daily. 04/02/19   [provider]  Insulin Glargine (BASAGLAR KWIKPEN) 100 UNIT/ML SOPN Inject 0.06 mLs (6 Units total) into the skin daily. [facility] endocrine as of  01/21/2019 03/02/19   McLean-Scocuzza, Nino Glow, MD  Insulin Pen Needle (BD PEN NEEDLE NANO U/F) 32G X 4 MM MISC USE 1 DAILY 08/03/18   [name], FNP  ipratropium (ATROVENT) 0.03 % nasal spray Place 2 sprays into both nostrils every 12 (twelve) hours. 12/05/19   Rudene Re, MD  lidocaine (LIDODERM) 5 %  Place 1 patch onto the skin daily. Remove & Discard patch within 12 hours. 10/13/19   [name], FNP  megestrol (MEGACE) 40 MG/ML suspension  10/21/19   [provider]  metoprolol tartrate (LOPRESSOR) 25 MG tablet TAKE 1 TABLET BY MOUTH TWO  TIMES DAILY 04/02/19   [name], FNP  Multiple Vitamin (MULTIVITAMIN) tablet Take 1 tablet by mouth daily.    [provider]  mupirocin ointment (BACTROBAN) 2 % Place 1 application into the nose 2 (two) times daily. 08/02/19   Crecencio Mc, MD  nitroGLYCERIN (NITROSTAT) 0.4 MG SL tablet Place 1 tablet (0.4 mg total) under the tongue every 5 (five) minutes as needed for chest pain. 12/19/17   Minna Merritts, MD  ondansetron (ZOFRAN) 4 MG tablet Take 1 tablet (4 mg total) by mouth every 8 (eight) hours as needed for nausea or vomiting. 08/02/19   [name], MD  ONE [facility] LANCETS MISC Use as directed 2 times per day 09/17/17   [name], FNP  pantoprazole (PROTONIX) 40 MG  tablet Take 1 tablet (40 mg total) by mouth at bedtime. 08/02/19   Crecencio Mc, MD  senna (SENOKOT) 8.6 MG TABS tablet Take 2 tablets (17.2 mg total) by mouth daily. Hold if diarrhea. Patient not taking: Reported on 10/25/2019 08/02/19   [name], MD  sodium chloride (OCEAN) 0.65 % SOLN nasal spray Place 1 spray into both nostrils as needed for congestion. 12/05/19   Rudene Re, MD  vitamin B-12 (CYANOCOBALAMIN) 1000 MCG tablet Take 1,000 mcg by mouth daily.    [provider]    Allergies Amoxicillin, Gabapentin, Mirtazapine, and Other  Family History  Problem Relation Age of Onset  . Sudden death Mother   . Arthritis Father   . Stroke Father   . Breast cancer Sister   . Diabetes Grandchild     Social History Social History   Tobacco Use  . Smoking status: Never Smoker  . Smokeless tobacco: Never Used  Substance Use Topics  . Alcohol use: No  . Drug use: No    Review of Systems  Constitutional: Negative for fever. Eyes: Negative for visual changes. ENT: Negative for sore throat. + congestion Neck: No neck pain  Cardiovascular: Negative for chest pain. Respiratory: Negative for shortness of breath. Gastrointestinal: Negative for abdominal pain, vomiting or diarrhea. Genitourinary: Negative for dysuria. Musculoskeletal: Negative for back pain. Skin: Negative for rash. Neurological: Negative for weakness or numbness. + HA Psych: No SI or HI  ____________________________________________   PHYSICAL EXAM:  VITAL SIGNS: ED Triage Vitals  Enc Vitals Group     BP 12/04/19 2213 (!) 192/58     Pulse Rate 12/04/19 2213 78     Resp 12/04/19 2213 18     Temp 12/04/19 2213 97.6 F (36.4 C)     Temp Source 12/04/19 2213 Oral     SpO2 12/04/19 2213 100 %     Weight 12/04/19 2214 92 lb (41.7 kg)     Height 12/04/19 2214 5' (1.524 m)     Head Circumference --      Peak Flow --      Pain Score 12/04/19 2214 10     Pain Loc --      Pain Edu? --       Excl. in [place]? --     Constitutional: Alert and oriented. Well appearing and in no apparent distress. HEENT:      Head: Normocephalic and atraumatic.  No tenderness to palpation over the temporal artery      Eyes: Conjunctivae are normal. Sclera is non-icteric.       Mouth/Throat: Mucous membranes are moist.       Neck: Supple with no signs of meningismus. Cardiovascular: Regular rate and rhythm. No murmurs, gallops, or rubs. Respiratory: Normal respiratory effort. Lungs are clear to auscultation bilaterally.  Musculoskeletal: Nontender with normal range of motion in all extremities. No edema, cyanosis, or erythema of extremities. Neurologic: Normal speech and language. Face is symmetric. EOMI, PERRL, intact strength and sensation, no pronator drift, no dysmetria Skin: Skin is warm, dry and intact. No rash noted. Psychiatric: Mood and affect are normal. Speech and behavior are normal.  ____________________________________________   LABS (all labs ordered are listed, but only abnormal results are displayed)  Labs Reviewed - No data to display ____________________________________________  EKG  none  ____________________________________________  RADIOLOGY  none  ____________________________________________   PROCEDURES  Procedure(s) performed: None Procedures Critical Care performed:  None ____________________________________________   INITIAL IMPRESSION / ASSESSMENT AND PLAN / ED COURSE   86 y.o. female with a history of chronic headaches, diabetes, chronic kidney disease, CAD, hypertension, hyperlipidemia who presents for evaluation of L sided facial pain and sinus congestion x 2 days.  Headache has now resolved.  Patient is extremely well-appearing in no distress with normal vital signs, normal neurological exam, no tenderness to palpation over the temporal artery.  Low suspicion for more serious or life threatening etiology of HA based on history and exam. No sudden  onset thunderclap HA, onset with exertion, vomiting, focal neurologic deficits, to suggest increased risk of subarachnoid hemorrhage. No fever, neck pain, neck stiffness, or meningismus on exam to suggest meningitis. No fevers, altered mental status, unusual behavior to suggest encephalitis. No focal neurologic deficits by history or exam to suggest central venous thrombosis. No constitutional symptoms including fever, fatigue, weight loss, temporal scalp tenderness, jaw claudication, visual loss, to suggest temporal arteritis. No immunocompromise to suggest increased risk for intracranial infectious disease. Patient has a history of glaucoma and cataract surgery in the past but today has normal IOP bilaterally.  No visual changes or findings on ocular exam to suggest acute angle closure glaucoma. No reports of toxic exposures including carbon monoxide or other household members with similar symptoms.  Most likely sinus HA. Will give intranasal saline and intranasal ipratropium to use as needed for sinus.  Recommended close follow-up with PCP.  Discussed my standard return precautions.     _____________________________________________ Please note:  Patient was evaluated in Emergency Department today for the symptoms described in the history of present illness. Patient was evaluated in the context of the global COVID-19 pandemic, which necessitated consideration that the patient might be at risk for infection with the SARS-CoV-2 virus that causes COVID-19. Institutional protocols and algorithms that pertain to the evaluation of patients at risk for COVID-19 are in a state of rapid change based on information released by regulatory bodies including the CDC and federal and state organizations. These policies and algorithms were followed during the patient's care in the ED.  Some ED evaluations and interventions may be delayed as a result of limited staffing during the pandemic.   [place] Controlled Substance  Database was reviewed by me. ____________________________________________   FINAL CLINICAL IMPRESSION(S) / ED DIAGNOSES   Final diagnoses:  Sinus headache      NEW MEDICATIONS STARTED DURING THIS VISIT:  ED Discharge Orders         Ordered  ipratropium (ATROVENT) 0.03 % nasal spray  Every 12 hours     Discontinue  Reprint     12/05/19 0254    sodium chloride (OCEAN) 0.65 % SOLN nasal spray  As needed     Discontinue  Reprint     12/05/19 0254           Note:  This document was prepared using Dragon voice recognition software and may include unintentional dictation errors.    Alfred Levins, [place], MD 12/05/19 [phone]

## 2019-12-08 ENCOUNTER — Encounter: Payer: Self-pay | Admitting: Oncology

## 2019-12-08 ENCOUNTER — Inpatient Hospital Stay: Payer: Medicare Other | Attending: Oncology

## 2019-12-08 ENCOUNTER — Inpatient Hospital Stay (HOSPITAL_BASED_OUTPATIENT_CLINIC_OR_DEPARTMENT_OTHER): Payer: Medicare Other | Admitting: Oncology

## 2019-12-08 ENCOUNTER — Other Ambulatory Visit: Payer: Self-pay

## 2019-12-08 VITALS — BP 144/53 | HR 72 | Temp 98.5°F | Resp 16 | Wt 90.0 lb

## 2019-12-08 DIAGNOSIS — Z9012 Acquired absence of left breast and nipple: Secondary | ICD-10-CM | POA: Insufficient documentation

## 2019-12-08 DIAGNOSIS — Z823 Family history of stroke: Secondary | ICD-10-CM | POA: Insufficient documentation

## 2019-12-08 DIAGNOSIS — Z79899 Other long term (current) drug therapy: Secondary | ICD-10-CM | POA: Diagnosis not present

## 2019-12-08 DIAGNOSIS — M8580 Other specified disorders of bone density and structure, unspecified site: Secondary | ICD-10-CM | POA: Insufficient documentation

## 2019-12-08 DIAGNOSIS — I129 Hypertensive chronic kidney disease with stage 1 through stage 4 chronic kidney disease, or unspecified chronic kidney disease: Secondary | ICD-10-CM | POA: Insufficient documentation

## 2019-12-08 DIAGNOSIS — R590 Localized enlarged lymph nodes: Secondary | ICD-10-CM | POA: Diagnosis not present

## 2019-12-08 DIAGNOSIS — Z803 Family history of malignant neoplasm of breast: Secondary | ICD-10-CM | POA: Insufficient documentation

## 2019-12-08 DIAGNOSIS — D631 Anemia in chronic kidney disease: Secondary | ICD-10-CM | POA: Insufficient documentation

## 2019-12-08 DIAGNOSIS — C7801 Secondary malignant neoplasm of right lung: Secondary | ICD-10-CM | POA: Diagnosis not present

## 2019-12-08 DIAGNOSIS — I2510 Atherosclerotic heart disease of native coronary artery without angina pectoris: Secondary | ICD-10-CM | POA: Insufficient documentation

## 2019-12-08 DIAGNOSIS — E1122 Type 2 diabetes mellitus with diabetic chronic kidney disease: Secondary | ICD-10-CM | POA: Insufficient documentation

## 2019-12-08 DIAGNOSIS — I6782 Cerebral ischemia: Secondary | ICD-10-CM | POA: Insufficient documentation

## 2019-12-08 DIAGNOSIS — Z880 Allergy status to penicillin: Secondary | ICD-10-CM | POA: Insufficient documentation

## 2019-12-08 DIAGNOSIS — C50912 Malignant neoplasm of unspecified site of left female breast: Secondary | ICD-10-CM | POA: Diagnosis not present

## 2019-12-08 DIAGNOSIS — I700 Atherosclerosis of aorta: Secondary | ICD-10-CM | POA: Insufficient documentation

## 2019-12-08 DIAGNOSIS — Z8261 Family history of arthritis: Secondary | ICD-10-CM | POA: Insufficient documentation

## 2019-12-08 DIAGNOSIS — Z79811 Long term (current) use of aromatase inhibitors: Secondary | ICD-10-CM | POA: Diagnosis not present

## 2019-12-08 DIAGNOSIS — Z888 Allergy status to other drugs, medicaments and biological substances status: Secondary | ICD-10-CM | POA: Diagnosis not present

## 2019-12-08 DIAGNOSIS — C50919 Malignant neoplasm of unspecified site of unspecified female breast: Secondary | ICD-10-CM | POA: Diagnosis not present

## 2019-12-08 DIAGNOSIS — I252 Old myocardial infarction: Secondary | ICD-10-CM | POA: Insufficient documentation

## 2019-12-08 DIAGNOSIS — K219 Gastro-esophageal reflux disease without esophagitis: Secondary | ICD-10-CM | POA: Insufficient documentation

## 2019-12-08 DIAGNOSIS — J90 Pleural effusion, not elsewhere classified: Secondary | ICD-10-CM | POA: Insufficient documentation

## 2019-12-08 DIAGNOSIS — N1831 Chronic kidney disease, stage 3a: Secondary | ICD-10-CM

## 2019-12-08 DIAGNOSIS — E785 Hyperlipidemia, unspecified: Secondary | ICD-10-CM | POA: Diagnosis not present

## 2019-12-08 DIAGNOSIS — Z833 Family history of diabetes mellitus: Secondary | ICD-10-CM | POA: Diagnosis not present

## 2019-12-08 LAB — CBC WITH DIFFERENTIAL/PLATELET
Abs Immature Granulocytes: 0.01 10*3/uL (ref 0.00–0.07)
Basophils Absolute: 0.1 10*3/uL (ref 0.0–0.1)
Basophils Relative: 1 %
Eosinophils Absolute: 0.2 10*3/uL (ref 0.0–0.5)
Eosinophils Relative: 4 %
HCT: 32 % — ABNORMAL LOW (ref 36.0–46.0)
Hemoglobin: 10.8 g/dL — ABNORMAL LOW (ref 12.0–15.0)
Immature Granulocytes: 0 %
Lymphocytes Relative: 15 %
Lymphs Abs: 0.9 10*3/uL (ref 0.7–4.0)
MCH: 30.1 pg (ref 26.0–34.0)
MCHC: 33.8 g/dL (ref 30.0–36.0)
MCV: 89.1 fL (ref 80.0–100.0)
Monocytes Absolute: 0.6 10*3/uL (ref 0.1–1.0)
Monocytes Relative: 10 %
Neutro Abs: 4.1 10*3/uL (ref 1.7–7.7)
Neutrophils Relative %: 70 %
Platelets: 249 10*3/uL (ref 150–400)
RBC: 3.59 MIL/uL — ABNORMAL LOW (ref 3.87–5.11)
RDW: 13.6 % (ref 11.5–15.5)
WBC: 5.8 10*3/uL (ref 4.0–10.5)
nRBC: 0 % (ref 0.0–0.2)

## 2019-12-08 LAB — COMPREHENSIVE METABOLIC PANEL
ALT: 24 U/L (ref 0–44)
AST: 27 U/L (ref 15–41)
Albumin: 3.6 g/dL (ref 3.5–5.0)
Alkaline Phosphatase: 102 U/L (ref 38–126)
Anion gap: 10 (ref 5–15)
BUN: 27 mg/dL — ABNORMAL HIGH (ref 8–23)
CO2: 21 mmol/L — ABNORMAL LOW (ref 22–32)
Calcium: 8.6 mg/dL — ABNORMAL LOW (ref 8.9–10.3)
Chloride: 104 mmol/L (ref 98–111)
Creatinine, Ser: 1.17 mg/dL — ABNORMAL HIGH (ref 0.44–1.00)
GFR calc Af Amer: 48 mL/min — ABNORMAL LOW (ref >60)
GFR calc non Af Amer: 41 mL/min — ABNORMAL LOW (ref >60)
Glucose, Bld: 301 mg/dL — ABNORMAL HIGH (ref 70–99)
Potassium: 3.9 mmol/L (ref 3.5–5.1)
Sodium: 135 mmol/L (ref 135–145)
Total Bilirubin: 0.5 mg/dL (ref 0.3–1.2)
Total Protein: 7 g/dL (ref 6.5–8.1)

## 2019-12-08 NOTE — Progress Notes (Signed)
Patient here for oncology follow-up appointment, expresses concerns of extreme fatigue and frequent headaches.

## 2019-12-08 NOTE — Progress Notes (Signed)
Hematology/Oncology  Follow up note [facility] [phone] Fax:([phone]) [phone]   Patient Care Team: [name], FNP as PCP - General (Family Medicine) Rockey Situ [name], MD as PCP - Cardiology (Cardiology)  REFERRING PROVIDER: [name], FNP  CHIEF COMPLAINTS/REASON FOR VISIT:  Follow up for breast cancer  HISTORY OF PRESENTING ILLNESS:   Tiffany Bailey is a  86 y.o.  female with PMH listed below was seen in consultation at the request of  [name], FNP  for evaluation of abnormal CT scan. Patient was recently evaluated by primary care provider for complaints of urinary retention, headache, constipation. 02/03/2019 x-ray of abdomen showed large colonic stool volume, nodular densities noted in the right mid lung are up). 02/08/2019 subsequent chest x-ray two-view showed hyperinflation of the lungs compatible with COPD.  Multiple nodular densities project over the mid and lower right lung. CT chest was obtained for further evaluation. 02/17/2019 CT chest without contrast Showed multiple bilateral pulmonary nodules,groundglass opacities. Largest solid nodule at the confluence of the right major and minor fissures measuring 1.6 cm.  Most notable groundglass nodule is of the right upper lobe and measure 1.8 x 0.8 cm, small right pleural effusion with associated atelectasis or consolidation.  Larger solid nodule are highly suspicious for metastatic disease. Bulky right mediastinal and hilar lymph node.  Largest pretracheal nodes measuring 3.5 x 3 cm.  There are additional bulky superior mediastinal, supraclavicular, left subpectoral lymph nodes.  Largest subpectoral nodes measuring 3.3 x 2.5 cm.  Patient was sent to cancer center for further evaluation.  #Patient reports a history of left breast cancer status post mastectomy.  She is a poor historian.  She is not able to recall the timeframe of breast cancer diagnosis and then treatments.   Denies any chemotherapy or radiation treatments.  #Left lower extremity history of amputation, CKD, History of MI, Sickle cell disease, DM, HTN, anemia.   Patient denies shortness of breath, chest pain, hemoptysis.  Night sweating, abdominal pain. Reports intentional weight loss.  Appetite is poor.  Not eating much. Also have wax and wane, chronic intermittent headache, she takes Tylenol as needed, with some relief.  Denies any nausea vomiting. She sees neurology. 11/10/2018 CT head showed no acute intracranial abnormality or significant interval changes.  Stable atrophy and white matter disease.  Atherosclerosis.  #Patient had a consultation visit with me on 02/19/2019.  At that time he was referred for abnormal CT scan.  PET scan was obtained and I recommend patient to obtain left axillary lymph node biopsy.  Patient/family called back and decided not to proceed with any additional work-up.  #February 2020 started Arimidex INTERVAL HISTORY Tiffany Bailey is a 86 y.o. female who has above history reviewed by me today presents for follow up visit for management of metastatic breast cancer. Problems and complaints are listed below: Patient reports feeling tired as well as intermittent headache.  She has been having sinus problems. Patient initially informed nurse that she is not taking Arimidex.  With further questioning, she clarified that she has been taking Arimidex once daily. Constipation and heartburn have resolved.  She is no longer taking Protonix and senna.  Patient reports feeling well.  She is on Arimidex.  Denies any pain, shortness of breath. Patient was accompanied by her daughter. Review of Systems  Constitutional: Positive for fatigue. Negative for appetite change, chills, fever and unexpected weight change.  HENT:   Negative for hearing loss and voice change.  Eyes: Negative for eye problems.  Respiratory: Negative for chest tightness, cough and shortness of breath.     Cardiovascular: Negative for chest pain.  Gastrointestinal: Negative for abdominal distention, abdominal pain, blood in stool and constipation.  Endocrine: Negative for hot flashes.  Genitourinary: Negative for difficulty urinating and frequency.   Musculoskeletal: Negative for arthralgias.       Left lower extremity history of amputation, intermittent sharp pain  Skin: Negative for itching and rash.  Neurological: Positive for headaches. Negative for extremity weakness.  Hematological: Negative for adenopathy.  Psychiatric/Behavioral: Negative for confusion.    MEDICAL HISTORY:  Past Medical History:  Diagnosis Date   Anemia    Anginal pain ([place])    Arthritis    Breast cancer ([place]) 07/19/2019   Cancer ([place])    BREAST   CKD (chronic kidney disease), stage III    Coronary artery disease    S/P CABG and multiple PCI's   Diabetes mellitus without complication (HCC)    Edema    Failure to thrive in adult    GERD (gastroesophageal reflux disease)    Hb-SS disease without crisis ([place]) 10/23/2017   History of breast cancer    HOH (hard of hearing)    Hyperlipidemia    Hypertension    Myocardial infarction [facility])    Palpitations     SURGICAL HISTORY: Past Surgical History:  Procedure Laterality Date   ABDOMINAL HYSTERECTOMY     ABOVE KNEE LEG AMPUTATION Left 2013   ANTERIOR VITRECTOMY Left 11/16/2015   Procedure: ANTERIOR VITRECTOMY;  Surgeon: Eulogio Bear, MD;  Location: [facility] ORS;  Service: Ophthalmology;  Laterality: Left;   BLADDER SURGERY     CATARACT EXTRACTION W/PHACO Left 11/16/2015   Procedure: CATARACT EXTRACTION PHACO AND INTRAOCULAR LENS PLACEMENT (IOC);  Surgeon: Eulogio Bear, MD;  Location: [facility] ORS;  Service: Ophthalmology;  Laterality: Left;  Lot # [id number] H [place]; 01:22.8 AP%:12.6 CDE: 10.46   CATARACT EXTRACTION W/PHACO Right 08/22/2016   Procedure: CATARACT EXTRACTION PHACO AND INTRAOCULAR LENS PLACEMENT (IOC);  Surgeon: Eulogio Bear, MD;  Location: [facility] ORS;  Service: Ophthalmology;  Laterality: Right;  Lot # [id number] H [place]: 00:52.1 AP%:8.7 CDE: 4.99    CORONARY ANGIOPLASTY     STENT   CORONARY ARTERY BYPASS GRAFT     LOWER EXTREMITY ANGIOGRAPHY Right 01/14/2019   Procedure: LOWER EXTREMITY ANGIOGRAPHY;  Surgeon: Algernon Huxley, MD;  Location: [place] [facility];  Service: Cardiovascular;  Laterality: Right;   MASTECTOMY     TUMOR REMOVAL     ABDOMINAL    SOCIAL HISTORY: Social History   Socioeconomic History   Marital status: Divorced    Spouse name: Not on file   Number of children: 7   Years of education: Not on file   Highest education level: Not on file  Occupational History   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  Tobacco Use   Smoking status: Never Smoker   Smokeless tobacco: Never Used  Substance and Sexual Activity   Alcohol use: No   Drug use: No   Sexual activity: Not Currently  Other Topics Concern   Not on file  Social History Narrative   Not on file   Social Determinants of Health   Financial Resource Strain: Low Risk    Difficulty of Paying Living Expenses: Not hard at all  Food Insecurity: No Food Insecurity   Worried About Charity fundraiser in the Last Year: Never true   Ran Out  of Food in the Last Year: Never true  Transportation Needs: No Transportation Needs   Lack of Transportation (Medical): No   Lack of Transportation (Non-Medical): No  Physical Activity: Insufficiently Active   Days of Exercise per Week: 7 days   Minutes of Exercise per Session: 20 min  Stress: No Stress Concern Present   Feeling of Stress : Only a little  Social Connections: Unknown   Frequency of Communication with Friends and Family: More than three times a week   Frequency of Social Gatherings with Friends and Family: Not on file   Attends Religious Services: Not on Electrical engineer or Organizations: Not on file   Attends Theatre manager Meetings: Not on file   Marital Status: Not on file  Intimate Partner Violence: Not At Risk   Fear of Current or Ex-Partner: No   Emotionally Abused: No   Physically Abused: No   Sexually Abused: No    FAMILY HISTORY: Family History  Problem Relation Age of Onset   Sudden death Mother    Arthritis Father    Stroke Father    Breast cancer Sister    Diabetes Grandchild     ALLERGIES:  is allergic to amoxicillin, gabapentin, mirtazapine, and other.  MEDICATIONS:  Current Outpatient Medications  Medication Sig Dispense Refill   acetaminophen (TYLENOL) 500 MG tablet Take 1 tablet (500 mg total) by mouth every 6 (six) hours as needed. 30 tablet 0   amLODipine (NORVASC) 10 MG tablet Take 1 tablet (10 mg total) by mouth daily. 90 tablet 3   aspirin EC 81 MG tablet Take 1 tablet (81 mg total) by mouth daily. 30 tablet 5   atorvastatin (LIPITOR) 40 MG tablet TAKE 1 TABLET BY MOUTH  DAILY 90 tablet 3   blood glucose meter kit and supplies KIT Dispense based on patient and insurance preference. Use up to four times daily as directed. (FOR ICD-9 250.00, 250.01). 1 each 0   Chromium-Cinnamon (CINNAMON PLUS CHROMIUM) [phone] MCG-MG CAPS Take 2 capsules by mouth daily.     clopidogrel (PLAVIX) 75 MG tablet Take 1 tablet (75 mg total) by mouth daily. 90 tablet 3   ferrous sulfate 325 (65 FE) MG EC tablet Take 1 tablet (325 mg total) by mouth 2 (two) times daily with a meal. 60 tablet 1   fluticasone (FLONASE) 50 MCG/ACT nasal spray Place 1 spray into both nostrils daily. 16 g 5   glucose blood (ONE TOUCH TEST STRIPS) test strip Use to test blood sugar twice daily 200 each 3   hydrALAZINE (APRESOLINE) 10 MG tablet Take 10 mg by mouth 2 (two) times daily.     imiquimod (ALDARA) 5 % cream Apply topically.     Insulin Glargine (BASAGLAR KWIKPEN) 100 UNIT/ML SOPN Inject 0.06 mLs (6 Units total) into the skin daily. [facility] as of  01/21/2019     Insulin Pen  Needle (BD PEN NEEDLE NANO U/F) 32G X 4 MM MISC USE 1 DAILY 100 each 12   ipratropium (ATROVENT) 0.03 % nasal spray Place 2 sprays into both nostrils every 12 (twelve) hours. 30 mL 12   megestrol (MEGACE) 40 MG/ML suspension      metoprolol tartrate (LOPRESSOR) 25 MG tablet TAKE 1 TABLET BY MOUTH TWO  TIMES DAILY 180 tablet 3   Multiple Vitamin (MULTIVITAMIN) tablet Take 1 tablet by mouth daily.     ONE TOUCH LANCETS MISC Use as directed 2 times per day 200 each 3  sodium chloride (OCEAN) 0.65 % SOLN nasal spray Place 1 spray into both nostrils as needed for congestion. 15 mL ml   vitamin B-12 (CYANOCOBALAMIN) 1000 MCG tablet Take 1,000 mcg by mouth daily.     anastrozole (ARIMIDEX) 1 MG tablet Take 1 tablet by mouth once daily (Patient not taking: Reported on 10/25/2019) 90 tablet 0   cyclobenzaprine (FLEXERIL) 5 MG tablet Take 1 tablet (5 mg total) by mouth at bedtime as needed for muscle spasms. (Patient not taking: Reported on 12/08/2019) 30 tablet 0   lidocaine (LIDODERM) 5 % Place 1 patch onto the skin daily. Remove & Discard patch within 12 hours. (Patient not taking: Reported on 12/08/2019) 30 patch 1   mupirocin ointment (BACTROBAN) 2 % Place 1 application into the nose 2 (two) times daily. (Patient not taking: Reported on 12/08/2019) 22 g 0   nitroGLYCERIN (NITROSTAT) 0.4 MG SL tablet Place 1 tablet (0.4 mg total) under the tongue every 5 (five) minutes as needed for chest pain. (Patient not taking: Reported on 12/08/2019) 25 tablet 4   ondansetron (ZOFRAN) 4 MG tablet Take 1 tablet (4 mg total) by mouth every 8 (eight) hours as needed for nausea or vomiting. (Patient not taking: Reported on 12/08/2019) 30 tablet 1   pantoprazole (PROTONIX) 40 MG tablet Take 1 tablet (40 mg total) by mouth at bedtime. (Patient not taking: Reported on 12/08/2019) 30 tablet 3   senna (SENOKOT) 8.6 MG TABS tablet Take 2 tablets (17.2 mg total) by mouth daily. Hold if diarrhea. (Patient not taking:  Reported on 10/25/2019) 30 tablet 1   No current facility-administered medications for this visit.     PHYSICAL EXAMINATION: ECOG PERFORMANCE STATUS: 2 - Symptomatic, <50% confined to bed Vitals:   12/08/19 1329  BP: (!) 144/53  Pulse: 72  Resp: 16  Temp: 98.5 F (36.9 C)  SpO2: 100%   Filed Weights   12/08/19 1329  Weight: 90 lb (40.8 kg)    Physical Exam Constitutional:      General: She is not in acute distress.    Comments: She sits in the wheel chair.  Frail appearance  HENT:     Head: Normocephalic and atraumatic.  Eyes:     General: No scleral icterus.    Pupils: Pupils are equal, round, and reactive to light.  Cardiovascular:     Rate and Rhythm: Normal rate and regular rhythm.     Heart sounds: Normal heart sounds.  Pulmonary:     Effort: Pulmonary effort is normal. No respiratory distress.     Breath sounds: No wheezing.     Comments: Decreased breath sound right lower lobe.  Good air entry on the left base.  Abdominal:     General: Bowel sounds are normal. There is no distension.     Palpations: Abdomen is soft.  Musculoskeletal:        General: No deformity. Normal range of motion.     Cervical back: Normal range of motion and neck supple.     Comments: Left lower extremity history of amputation.   Skin:    General: Skin is warm and dry.     Findings: No erythema or rash.  Neurological:     Mental Status: She is alert and oriented to person, place, and time. Mental status is at baseline.     Cranial Nerves: No cranial nerve deficit.     Coordination: Coordination normal.  Psychiatric:        Mood and Affect: Mood normal.  LABORATORY DATA:  I have reviewed the data as listed Lab Results  Component Value Date   WBC 5.8 12/08/2019   HGB 10.8 (L) 12/08/2019   HCT 32.0 (L) 12/08/2019   MCV 89.1 12/08/2019   PLT 249 12/08/2019   Recent Labs    09/13/19 1326 10/25/19 1247 12/08/19 1252  NA 135 134* 135  K 3.8 3.6 3.9  CL 107 106 104   CO2 20* 20* 21*  GLUCOSE 220* 203* 301*  BUN 29* 31* 27*  CREATININE 1.30* 1.22* 1.17*  CALCIUM 8.6* 8.7* 8.6*  GFRNONAA 36* 39* 41*  GFRAA 42* 45* 48*  PROT 6.6 6.5 7.0  ALBUMIN 3.4* 3.4* 3.6  AST _0 ALT 19 33 24  ALKPHOS 64 81 102  BILITOT 0.5 0.7 0.5   Iron/TIBC/Ferritin/ %Sat    Component Value Date/Time   IRON 68 09/13/2019 1326   TIBC 260 09/13/2019 1326   FERRITIN 62 09/13/2019 1326   IRONPCTSAT 26 09/13/2019 1326   IRONPCTSAT 27 06/19/2018 1006      RADIOGRAPHIC STUDIES: I have personally reviewed the radiological images as listed and agreed with the findings in the report. CT Head Wo Contrast  Result Date: 10/19/2019 CLINICAL DATA:  Fall last week with head injury. Posttraumatic headache. EXAM: CT HEAD WITHOUT CONTRAST TECHNIQUE: Contiguous axial images were obtained from the base of the skull through the vertex without intravenous contrast. COMPARISON:  11/10/2018 FINDINGS: Brain: Ventricles, cisterns and other CSF spaces are normal. There is mild chronic ischemic microvascular disease. There is no mass, mass effect, shift of midline structures or acute hemorrhage. No evidence of acute infarction. Bilateral basal ganglia calcifications are present. Vascular: No hyperdense vessel or unexpected calcification. Skull: Normal. Negative for fracture or focal lesion. Sinuses/Orbits: No acute finding. Other: None. IMPRESSION: 1.  No acute findings. 2.  Mild chronic ischemic microvascular disease. Electronically Signed   By: Marin Olp M.D.   On: 10/19/2019 12:25   CT Chest Wo Contrast  Result Date: 10/19/2019 CLINICAL DATA:  Metastatic breast cancer, assess treatment response EXAM: CT CHEST WITHOUT CONTRAST TECHNIQUE: Multidetector CT imaging of the chest was performed following the standard protocol without IV contrast. COMPARISON:  CT chest, 07/26/2019, PET-CT, 02/25/2019 FINDINGS: Cardiovascular: Aortic atherosclerosis. Normal heart size. Extensive 3 vessel coronary  artery calcifications and/or stents. No pericardial effusion. Mediastinum/Nodes: Interval improvement of numerous bulky mediastinal, lower cervical, right internal mammary, and left axillary/subpectoral lymph nodes, an index pretracheal node or conglomerate measuring 3.4 x 3.0 cm, previously 3.7 x 3.5 cm (series 2, image 53). Thyroid gland, trachea, and esophagus demonstrate no significant findings. Lungs/Pleura: There are numerous bilateral pleural masses and nodules, decreased in size compared to prior examination. An index mass of the anterior right lower lobe diaphoretic pleura measures 3.3 x 1.6 cm, previously 4.1 x 2.8 cm (series 2, image 116). Unchanged moderate right pleural effusion with associated atelectasis or consolidation. Essentially complete resolution of a previously seen solid pulmonary nodule of the lingula, this residual measuring approximately 2 mm in thickness, previously a rounded nodule measuring 7 mm (series 3, image 70). Multiple unchanged bilateral ground-glass and subsolid pulmonary nodules, the largest subsolid pulmonary nodule of the central right upper lobe measuring 1.5 x 1.0 cm (series 8, image 63). Upper Abdomen: No acute abnormality. Musculoskeletal: Status post left mastectomy. IMPRESSION: 1. Interval improvement in numerous bilateral pulmonary and pleural masses and nodules. 2. Interval improvement in mediastinal, right internal mammary, lower cervical, and left axillary/subpectoral lymphadenopathy. 3.  Findings are consistent  with treatment response. 4. Additional bilateral ground-glass and subsolid pulmonary nodules are unchanged, perhaps incidentally present infectious or inflammatory nodules although indolent synchronous primary lung adenocarcinoma remains a consideration. Attention on follow-up. 5. Unchanged moderate right pleural effusion associated atelectasis or consolidation. 6.  Coronary artery disease.  Aortic Atherosclerosis (ICD10-I70.0). Electronically Signed   By:  Eddie Candle M.D.   On: 10/19/2019 15:22   DG Bone Density  Result Date: 10/20/2019 EXAM: DUAL X-RAY ABSORPTIOMETRY (DXA) FOR BONE MINERAL DENSITY IMPRESSION: Your patient Tiffany Bailey completed a BMD test on 10/20/2019 using the [place] (software version: 14.10) manufactured by [employer]. The following summarizes the results of our evaluation. Technologist: SCE PATIENT BIOGRAPHICAL: Name: Tiffany Bailey, Tiffany Bailey Patient ID: [id number] Birth Date: 08/11/1927 Height: 60.0 in. Gender: Female Exam Date: 10/20/2019 Weight: 88.0 lbs. Indications: Advanced Age, Breast CA, Diabetic, Height Loss, High Risk Meds, History of Fracture (Adult), Hysterectomy, Oophorectomy Bilateral, Osteoarthritis, Postmenopausal Fractures: coccyx Treatments: Arimidex, Insulin, Multi-Vitamin, Protonix DENSITOMETRY RESULTS: Site         Region     Measured Date Measured Age WHO Classification Young Adult T-score BMD         %Change vs. Previous Significant Change (*) DualFemur Total Left 10/20/2019 89.6 Osteoporosis -5.6 0.304 g/cm2 - - Left Forearm Radius 33% 10/20/2019 89.6 Osteoporosis -4.2 0.509 g/cm2 - - ASSESSMENT: The BMD measured at Femur Total Left is 0.304 g/cm2 with a T-score of -5.6. This patient is considered osteoporotic according to [facility]) criteria. The scan quality is good.Lumbar spine was not utilized due to advanced degenerative changes. World Pharmacologist [facility]) criteria for post-menopausal, Caucasian Women: Normal:                   T-score at or above -1 SD Osteopenia/low bone mass: T-score between -1 and -2.5 SD Osteoporosis:             T-score at or below -2.5 SD RECOMMENDATIONS: 1. All patients should optimize calcium and vitamin D intake. 2. Consider FDA-approved medical therapies in postmenopausal women and men aged 65 years and older, based on the following: a. A hip or vertebral(clinical or morphometric) fracture b. T-score < -2.5 at the femoral neck or spine after  appropriate evaluation to exclude secondary causes c. Low bone mass (T-score between -1.0 and -2.5 at the femoral neck or spine) and a 10-year probability of a hip fracture > 3% or a 10-year probability of a major osteoporosis-related fracture > 20% based on the [place]-adapted WHO algorithm 3. Clinician judgment and/or patient preferences may indicate treatment for people with 10-year fracture probabilities above or below these levels FOLLOW-UP: People with diagnosed cases of osteoporosis or at high risk for fracture should have regular bone mineral density tests. For patients eligible for Medicare, routine testing is allowed once every 2 years. The testing frequency can be increased to one year for patients who have rapidly progressing disease, those who are receiving or discontinuing medical therapy to restore bone mass, or have additional risk factors. I have reviewed this report, and agree with the above findings. [facility] Electronically Signed   By: Lowella Grip III M.D.   On: 10/20/2019 15:25   VAS [place] ABI WITH/WO TBI  Result Date: 11/16/2019 LOWER EXTREMITY DOPPLER STUDY Indications: Peripheral artery disease, and Left BKA              Right heel pain.  Vascular Interventions: 01/14/2019 PTA Rt peron A, tibioperoneal trunk and  popliteal A. PTA and stent Rt SFA. Comparison Study: 05/12/2019 Performing Technologist: Almira Coaster RVS  Examination Guidelines: A complete evaluation includes at minimum, Doppler waveform signals and systolic blood pressure reading at the level of bilateral brachial, anterior tibial, and posterior tibial arteries, when vessel segments are accessible. Bilateral testing is considered an integral part of a complete examination. Photoelectric Plethysmograph (PPG) waveforms and toe systolic pressure readings are included as required and additional duplex testing as needed. Limited examinations for reoccurring indications may be performed as  noted.  ABI Findings: +---------+------------------+-----+----------+--------+  Right     Rt Pressure (mmHg) Index Waveform   Comment   +---------+------------------+-----+----------+--------+  Brachial  201                                           +---------+------------------+-----+----------+--------+  ATA       184                0.92  monophasic           +---------+------------------+-----+----------+--------+  PTA       187                0.93  monophasic           +---------+------------------+-----+----------+--------+  Great Toe 113                0.56  Abnormal             +---------+------------------+-----+----------+--------+ +--------+------------------+-----+--------+---------+  Left     Lt Pressure (mmHg) Index Waveform Comment    +--------+------------------+-----+--------+---------+  Brachial                                   Masectomy  +--------+------------------+-----+--------+---------+  ATA                                        Lt BKA     +--------+------------------+-----+--------+---------+ +-------+-----------+-----------+------------+------------+  ABI/TBI Today's ABI Today's TBI Previous ABI Previous TBI  +-------+-----------+-----------+------------+------------+  Right   .93         .56         1.05         .49           +-------+-----------+-----------+------------+------------+ Right ABIs appear decreased compared to prior study on 05/12/2019.  Summary: Right: Resting right ankle-brachial index indicates mild right lower extremity arterial disease.  *See table(s) above for measurements and observations.  Electronically signed by Leotis Pain MD on 11/16/2019 at 1:21:21 PM.   Final       ASSESSMENT & PLAN:  1. Metastatic breast cancer ([place])   2. Use of anastrozole (Arimidex)   3. Anemia due to stage 3a chronic kidney disease   4. Osteopenia, unspecified location    #Metastatic breast cancer,  10/19/2019 CT showed partial response.  Continue Arimidex.   Osteopenia, bone  density scan results were discussed with patient and daughter.  She has baseline osteopenia. Recommend patient to start calcium 1200 mg daily as well as vitamin D  800 units daily. Awaiting for dental clearance for bisphosphonate.   # uncontrolled diabetes. Patient admits not being complaint with diabetic diet and also does not measure her blood glucose.  Blood glucose is over 300s.  Recommend patient to go to emergency room for management of hyper glycemia Patient declined.  Recommend patient to call primary care physician for further management of her diabetic regimen.  # Anemia of CKD, hemoglobin is above 10, no intervention needed.   Orders Placed This Encounter  Procedures   CBC with Differential/Platelet    Standing Status:   Future    Standing Expiration Date:   12/07/2020   Comprehensive metabolic panel    Standing Status:   Future    Standing Expiration Date:   12/07/2020    All questions were answered. The patient knows to call the clinic with any problems questions or concerns. Return of visit:  3 months.   [name], MD, PhD Hematology Oncology [facility] Pager- [phone] 12/08/2019

## 2019-12-09 ENCOUNTER — Other Ambulatory Visit: Payer: Medicare Other | Admitting: Nurse Practitioner

## 2019-12-15 ENCOUNTER — Ambulatory Visit (INDEPENDENT_AMBULATORY_CARE_PROVIDER_SITE_OTHER): Payer: Medicare Other | Admitting: Family

## 2019-12-15 ENCOUNTER — Other Ambulatory Visit: Payer: Self-pay

## 2019-12-15 ENCOUNTER — Encounter: Payer: Self-pay | Admitting: Family

## 2019-12-15 VITALS — BP 122/68 | HR 66 | Temp 98.9°F | Ht 60.0 in

## 2019-12-15 DIAGNOSIS — R5383 Other fatigue: Secondary | ICD-10-CM

## 2019-12-15 DIAGNOSIS — E1159 Type 2 diabetes mellitus with other circulatory complications: Secondary | ICD-10-CM

## 2019-12-15 DIAGNOSIS — Z89612 Acquired absence of left leg above knee: Secondary | ICD-10-CM | POA: Diagnosis not present

## 2019-12-15 DIAGNOSIS — I10 Essential (primary) hypertension: Secondary | ICD-10-CM

## 2019-12-15 DIAGNOSIS — R519 Headache, unspecified: Secondary | ICD-10-CM

## 2019-12-15 DIAGNOSIS — Z794 Long term (current) use of insulin: Secondary | ICD-10-CM

## 2019-12-15 DIAGNOSIS — R531 Weakness: Secondary | ICD-10-CM | POA: Insufficient documentation

## 2019-12-15 MED ORDER — LIDOCAINE 5 % EX PTCH: 1.0000 | MEDICATED_PATCH | CUTANEOUS | 0 refills | Status: DC
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
## 2019-12-15 NOTE — Assessment & Plan Note (Signed)
Symptoms consistent with ongoing phantom pain. We discussed tramadol however she didn't feel well on gabapentin, and would feel most comfortable if we were able to control pain with topical regimen. I have provided lidocaine patches today. She will let me know how she is doing.

## 2019-12-15 NOTE — Assessment & Plan Note (Signed)
Suspect multifactorial including metastatic breast cancer, CKD, anemia secondary to ckd, lack of exercise. Pending TSH for completeness.

## 2019-12-15 NOTE — Assessment & Plan Note (Signed)
Uncontrolled. Pleased however from safety standpoint that hypoglycemic episodes have resolved as she returned to prior basaglar dose, 6 units. She will continue to follow with endocrine.

## 2019-12-15 NOTE — Assessment & Plan Note (Signed)
Very pleased with control of blood pressure today as had been quite labile in the past. She brought medications with her today and medications reconciled so I feel more assured no confusion with medications. Continue regimen.

## 2019-12-15 NOTE — Patient Instructions (Addendum)
Your medications are accurate.   Please hold on to Fioricet medication ( butalbital/acetaminophen/caffeine) until you see neurology. I have put a sticky note on this medication.   Please call [facility] and make a follow up - you saw Chipper Herb in December 2020.   [phone]  Trial claritin for congestion to see if this also helps with headache  Please trial lidocaine patch for left leg pain. You may use tylenol. No ibuprofen   Please let me know how you are doing.

## 2019-12-15 NOTE — Assessment & Plan Note (Signed)
Resolved at today's visit. We discussed the potential of allergies exacerbating; advised her to stay on intranasal saline ipratropium as seems to have been helpful and to start claritin. She had previous prescription for Fioricet which has appeared to be helpful in the past. Due to creatinine clearance, I have asked her to not resume until she has seen [facility] again for follow up. She will call to schedule her follow up.

## 2019-12-16 LAB — TSH: TSH: 0.88 u[IU]/mL (ref 0.35–4.50)

## 2019-12-22 ENCOUNTER — Telehealth: Payer: Self-pay

## 2019-12-22 NOTE — Telephone Encounter (Signed)
PA submitted for lidocaine 5% patches in covermymeds. Key: [id number]

## 2019-12-24 ENCOUNTER — Other Ambulatory Visit: Payer: Self-pay | Admitting: Cardiovascular Disease

## 2019-12-24 NOTE — Telephone Encounter (Signed)
PA is denied ..

## 2019-12-28 NOTE — Telephone Encounter (Signed)
Called patient and gave results of PA.

## 2019-12-29 DIAGNOSIS — Z139 Encounter for screening, unspecified: Secondary | ICD-10-CM | POA: Diagnosis not present

## 2019-12-29 NOTE — Telephone Encounter (Signed)
Call patient and advised her of substitutions that are over-the-counter.  Tiffany Bailey makes a really nice pain patch which she can try.  She also may use Biofreeze or Voltaren gel which are both over-the-counter.

## 2019-12-30 NOTE — Telephone Encounter (Signed)
Patient verbalized understanding and had no further questions.  
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
## 2020-01-03 ENCOUNTER — Ambulatory Visit: Payer: Medicare Other | Admitting: Cardiovascular Disease

## 2020-01-03 DIAGNOSIS — E119 Type 2 diabetes mellitus without complications: Secondary | ICD-10-CM | POA: Diagnosis not present

## 2020-01-04 ENCOUNTER — Encounter: Payer: Self-pay | Admitting: Cardiovascular Disease

## 2020-01-04 ENCOUNTER — Other Ambulatory Visit: Payer: Self-pay

## 2020-01-04 ENCOUNTER — Other Ambulatory Visit: Payer: Medicare Other | Admitting: Nurse Practitioner

## 2020-01-04 ENCOUNTER — Telehealth: Payer: Self-pay | Admitting: Nurse Practitioner

## 2020-01-04 ENCOUNTER — Telehealth: Payer: Self-pay | Admitting: Family

## 2020-01-04 NOTE — Telephone Encounter (Signed)
I was informed by palliative care NP Christin Gusler that patient is following with another pcp through [facility] which may be more appropriate for patient  Please call   Lauretta Grill NP; My Alben Deeds; [phone]  And ask her if she if she following patient and how we should move forward  Should we speak to patient to see what makes the most sense for her?   I am happy to follow patient or if patient would like to  Transfer primary care to [facility], that is fine too.

## 2020-01-04 NOTE — Telephone Encounter (Signed)
I called Ms. Callanan for f/u in person PC visit today, Ms. Aure endorses she is not feeling up to visit today, Ms. Macfarlane requested to reschedule. Rescheduled per Ms. Hollan request.

## 2020-01-07 NOTE — Telephone Encounter (Signed)
Lauretta Grill returned the phone call and states that She is the PCP of Yulianna Folse and that she has been following her for a few years. Prapti was supposed to inform us that She has a different PCP.  I have removed you from her Care team.

## 2020-01-07 NOTE — Telephone Encounter (Signed)
Left a message for [facility] to return call and follow up about Patient care.

## 2020-01-13 DIAGNOSIS — I2510 Atherosclerotic heart disease of native coronary artery without angina pectoris: Secondary | ICD-10-CM | POA: Diagnosis not present

## 2020-01-13 DIAGNOSIS — Z79811 Long term (current) use of aromatase inhibitors: Secondary | ICD-10-CM | POA: Diagnosis not present

## 2020-01-13 DIAGNOSIS — R0981 Nasal congestion: Secondary | ICD-10-CM | POA: Diagnosis not present

## 2020-01-13 DIAGNOSIS — R202 Paresthesia of skin: Secondary | ICD-10-CM | POA: Diagnosis not present

## 2020-01-13 DIAGNOSIS — Z7902 Long term (current) use of antithrombotics/antiplatelets: Secondary | ICD-10-CM | POA: Diagnosis not present

## 2020-01-13 DIAGNOSIS — Z9010 Acquired absence of unspecified breast and nipple: Secondary | ICD-10-CM | POA: Diagnosis not present

## 2020-01-13 DIAGNOSIS — Z880 Allergy status to penicillin: Secondary | ICD-10-CM | POA: Diagnosis not present

## 2020-01-13 DIAGNOSIS — E785 Hyperlipidemia, unspecified: Secondary | ICD-10-CM | POA: Diagnosis not present

## 2020-01-13 DIAGNOSIS — H53149 Visual discomfort, unspecified: Secondary | ICD-10-CM | POA: Diagnosis not present

## 2020-01-13 DIAGNOSIS — M436 Torticollis: Secondary | ICD-10-CM | POA: Diagnosis not present

## 2020-01-13 DIAGNOSIS — E1151 Type 2 diabetes mellitus with diabetic peripheral angiopathy without gangrene: Secondary | ICD-10-CM | POA: Diagnosis not present

## 2020-01-13 DIAGNOSIS — I252 Old myocardial infarction: Secondary | ICD-10-CM | POA: Diagnosis not present

## 2020-01-13 DIAGNOSIS — Z89612 Acquired absence of left leg above knee: Secondary | ICD-10-CM | POA: Diagnosis not present

## 2020-01-13 DIAGNOSIS — Z794 Long term (current) use of insulin: Secondary | ICD-10-CM | POA: Diagnosis not present

## 2020-01-13 DIAGNOSIS — E1122 Type 2 diabetes mellitus with diabetic chronic kidney disease: Secondary | ICD-10-CM | POA: Diagnosis not present

## 2020-01-13 DIAGNOSIS — R519 Headache, unspecified: Secondary | ICD-10-CM | POA: Diagnosis not present

## 2020-01-13 DIAGNOSIS — I509 Heart failure, unspecified: Secondary | ICD-10-CM | POA: Diagnosis not present

## 2020-01-13 DIAGNOSIS — R110 Nausea: Secondary | ICD-10-CM | POA: Diagnosis not present

## 2020-01-13 DIAGNOSIS — Z7982 Long term (current) use of aspirin: Secondary | ICD-10-CM | POA: Diagnosis not present

## 2020-01-13 DIAGNOSIS — N189 Chronic kidney disease, unspecified: Secondary | ICD-10-CM | POA: Diagnosis not present

## 2020-01-13 DIAGNOSIS — Z79899 Other long term (current) drug therapy: Secondary | ICD-10-CM | POA: Diagnosis not present

## 2020-01-13 DIAGNOSIS — I130 Hypertensive heart and chronic kidney disease with heart failure and stage 1 through stage 4 chronic kidney disease, or unspecified chronic kidney disease: Secondary | ICD-10-CM | POA: Diagnosis not present

## 2020-01-13 DIAGNOSIS — C50919 Malignant neoplasm of unspecified site of unspecified female breast: Secondary | ICD-10-CM | POA: Diagnosis not present

## 2020-01-20 ENCOUNTER — Other Ambulatory Visit: Payer: Self-pay

## 2020-01-20 ENCOUNTER — Encounter: Payer: Self-pay | Admitting: Family

## 2020-01-20 ENCOUNTER — Ambulatory Visit (INDEPENDENT_AMBULATORY_CARE_PROVIDER_SITE_OTHER): Payer: Medicare Other | Admitting: Family

## 2020-01-20 VITALS — BP 120/60 | HR 64 | Ht 60.0 in

## 2020-01-20 DIAGNOSIS — E1122 Type 2 diabetes mellitus with diabetic chronic kidney disease: Secondary | ICD-10-CM

## 2020-01-20 DIAGNOSIS — I25118 Atherosclerotic heart disease of native coronary artery with other forms of angina pectoris: Secondary | ICD-10-CM | POA: Diagnosis not present

## 2020-01-20 DIAGNOSIS — I10 Essential (primary) hypertension: Secondary | ICD-10-CM | POA: Diagnosis not present

## 2020-01-20 DIAGNOSIS — I739 Peripheral vascular disease, unspecified: Secondary | ICD-10-CM

## 2020-01-20 DIAGNOSIS — Z794 Long term (current) use of insulin: Secondary | ICD-10-CM

## 2020-01-20 DIAGNOSIS — N183 Chronic kidney disease, stage 3 unspecified: Secondary | ICD-10-CM

## 2020-01-20 DIAGNOSIS — E785 Hyperlipidemia, unspecified: Secondary | ICD-10-CM | POA: Diagnosis not present

## 2020-01-20 DIAGNOSIS — E1159 Type 2 diabetes mellitus with other circulatory complications: Secondary | ICD-10-CM

## 2020-01-20 NOTE — Progress Notes (Signed)
Office Visit    Patient Name: Tiffany Bailey Date of Encounter: 01/21/2020  Primary Care Provider:  [name], FNP Primary Cardiologist:  Ida Rogue, MD Electrophysiologist:  None   Chief Complaint    Tiffany Bailey is a 86 y.o. female with a hx of CAD s/p CABG, CKD, GERD, PAD s/p left AKA, DM2 on insulin, HTN, arthritis, breast cancer presents today for follow up of CAD   Past Medical History    Past Medical History:  Diagnosis Date  . Anemia   . Anginal pain ([place])   . Arthritis   . Breast cancer ([place]) 07/19/2019  . Cancer (HCC)    BREAST  . CKD (chronic kidney disease), stage III   . Coronary artery disease    S/P CABG and multiple PCI's  . Diabetes mellitus without complication ([place])   . Edema   . Failure to thrive in adult   . GERD (gastroesophageal reflux disease)   . Hb-SS disease without crisis ([place]) 10/23/2017  . History of breast cancer   . HOH (hard of hearing)   . Hyperlipidemia   . Hypertension   . Myocardial infarction ([place])   . Palpitations    Past Surgical History:  Procedure Laterality Date  . ABDOMINAL HYSTERECTOMY    . ABOVE KNEE LEG AMPUTATION Left 2013  . ANTERIOR VITRECTOMY Left 11/16/2015   Procedure: ANTERIOR VITRECTOMY;  Surgeon: Eulogio Bear, MD;  Location: [facility] ORS;  Service: Ophthalmology;  Laterality: Left;  . BLADDER SURGERY    . CATARACT EXTRACTION W/PHACO Left 11/16/2015   Procedure: CATARACT EXTRACTION PHACO AND INTRAOCULAR LENS PLACEMENT (IOC);  Surgeon: Eulogio Bear, MD;  Location: [facility] ORS;  Service: Ophthalmology;  Laterality: Left;  Lot # [id number] H [place]; 01:22.8 AP%:12.6 CDE: 10.46  . CATARACT EXTRACTION W/PHACO Right 08/22/2016   Procedure: CATARACT EXTRACTION PHACO AND INTRAOCULAR LENS PLACEMENT (IOC);  Surgeon: Eulogio Bear, MD;  Location: [facility] ORS;  Service: Ophthalmology;  Laterality: Right;  Lot # [id number] H [place]: 00:52.1 AP%:8.7 CDE: 4.99   . CORONARY ANGIOPLASTY     STENT  . CORONARY ARTERY BYPASS GRAFT     . LOWER EXTREMITY ANGIOGRAPHY Right 01/14/2019   Procedure: LOWER EXTREMITY ANGIOGRAPHY;  Surgeon: Algernon Huxley, MD;  Location: [place] [facility];  Service: Cardiovascular;  Laterality: Right;  . MASTECTOMY    . TUMOR REMOVAL     ABDOMINAL    Allergies  Allergies  Allergen Reactions  . Amoxicillin Hives and Nausea Only  . Gabapentin Other (See Comments)    AMS Confusion  . Mirtazapine Itching    Dry mouth with ODT Denies SOB with medication  . Other     UNKNOWN PAIN MEDICATION - unknown reaction    History of Present Illness    RAEONNA Bailey is a 86 y.o. female with a hx of CAD s/p CABG, CKD, GERD, PAD s/p left AKA, DM2 on insulin, HTN, arthritis, breast cancer  last seen 07/02/19 by Elenor Quinones, PA.  CAD s/p CABG X3 in 2002 (LIMA-LAD, SVG-D1, SVG-RCA).  July 2019 echo with LVEF 55 to 60%, normal wall motion, grade 1 diastolic dysfunction, mild to moderate MR, mild to moderate TR, PASP 48 mmHg.  Hospitalized July 2020 for PAD and she was followed by vascular surgery for worsening right-sided PAD with ulceration.  Mild disease noted at the common femoral artery with severe disease of profunda femoris artery beyond the primary branches with poor collateral flow.  Underwent angioplasty. She was recommended for  medical management, walking regimen, antiplatelet agent.  04/2019 she had repeat studies with ABI 1.5 and TBI 0.7 of LLE.  When seen in clinic January 2021 he was doing overall well with only some mild musculoskeletal chest pain but no symptoms concerning for angina.  No medication changes made at that time.  She continues to follow with Dr. Tasia Catchings of medical oncology regarding her metastatic breast cancer.  Her CT scan 10/19/2019 showed partial response to Arimidex.   Present today with her family member.  Reports no shortness of breath nor dyspnea on exertion. Reports no chest pain, pressure, or tightness. No edema, orthopnea, PND. Reports no palpitations. Does endorse fatigue,  but this is unchanged compared to her baseline.   She is predominantly wheelchair bound but does her best to be active around her home.   EKGs/Labs/Other Studies Reviewed:   The following studies were reviewed today: EKG:  EKG is ordered today.  The ekg ordered today demonstrates SR 64 bpm with prior inferior infarct.  Recent Labs: 12/08/2019: ALT 24; BUN 27; Creatinine, Ser 1.17; Hemoglobin 10.8; Platelets 249; Potassium 3.9; Sodium 135 12/15/2019: TSH 0.88  Recent Lipid Panel    Component Value Date/Time   CHOL 136 02/26/2019 0828   TRIG 63.0 02/26/2019 0828   HDL 46.50 02/26/2019 0828   CHOLHDL 3 02/26/2019 0828   VLDL 12.6 02/26/2019 0828   LDLCALC 76 02/26/2019 0828    Home Medications   Current Meds  Medication Sig  . amLODipine (NORVASC) 10 MG tablet TAKE 1 TABLET BY MOUTH  DAILY  . anastrozole (ARIMIDEX) 1 MG tablet Take 1 tablet by mouth once daily  . aspirin EC 81 MG tablet Take 1 tablet (81 mg total) by mouth daily.  Marland Kitchen atorvastatin (LIPITOR) 40 MG tablet TAKE 1 TABLET BY MOUTH  DAILY  . blood glucose meter kit and supplies KIT Dispense based on patient and insurance preference. Use up to four times daily as directed. (FOR ICD-9 250.00, 250.01).  . Chromium-Cinnamon (CINNAMON PLUS CHROMIUM) [phone] MCG-MG CAPS Take 2 capsules by mouth daily.  . clopidogrel (PLAVIX) 75 MG tablet TAKE 1 TABLET BY MOUTH  DAILY  . CORTISPORIN-TC 3.08-17-08-0.5 MG/ML OTIC suspension SMARTSIG:In Ear(s)  . ferrous sulfate 325 (65 FE) MG EC tablet Take 1 tablet (325 mg total) by mouth 2 (two) times daily with a meal.  . fluticasone (FLONASE) 50 MCG/ACT nasal spray Place 1 spray into both nostrils daily.  Marland Kitchen glucose blood (ONE TOUCH TEST STRIPS) test strip Use to test blood sugar twice daily  . hydrALAZINE (APRESOLINE) 10 MG tablet Take 10 mg by mouth 2 (two) times daily.  Marland Kitchen ibuprofen (ADVIL) 100 MG/5ML suspension Take 200 mg by mouth every 8 (eight) hours as needed.  . Insulin Glargine (BASAGLAR  KWIKPEN) 100 UNIT/ML SOPN Inject 0.06 mLs (6 Units total) into the skin daily. [place] endocrine as of  01/21/2019  . Insulin Pen Needle (BD PEN NEEDLE NANO U/F) 32G X 4 MM MISC USE 1 DAILY  . ipratropium (ATROVENT) 0.03 % nasal spray Place 2 sprays into both nostrils every 12 (twelve) hours.  . megestrol (MEGACE) 40 MG/ML suspension   . metoprolol tartrate (LOPRESSOR) 25 MG tablet TAKE 1 TABLET BY MOUTH TWO  TIMES DAILY  . nitroGLYCERIN (NITROSTAT) 0.4 MG SL tablet Place 1 tablet (0.4 mg total) under the tongue every 5 (five) minutes as needed for chest pain.  . ONE TOUCH LANCETS MISC Use as directed 2 times per day  . vitamin B-12 (CYANOCOBALAMIN) 1000 MCG  tablet Take 1,000 mcg by mouth daily.     Review of Systems  All other systems reviewed and are otherwise negative except as noted above.  Physical Exam    VS:  BP 120/60 (BP Location: Right Arm, Patient Position: Sitting, Cuff Size: Normal)   Pulse 64   Ht 5' (1.524 m)   SpO2 97%   BMI 17.58 kg/m  , BMI Body mass index is 17.58 kg/m. GEN: Thin, frail, well developed, in no acute distress. HEENT: normal. Neck: Supple, no JVD, carotid bruits, or masses. Cardiac: RRR, no murmurs, rubs, or gallops. No clubbing, cyanosis, edema.  Radials and equal bilaterally. RLE PT 1+.  Respiratory:  Respirations regular and unlabored, clear to auscultation bilaterally. GI: Soft, nontender, nondistended, BS + x 4. MS: No deformity or atrophy. S/p L AKA Skin: Warm and dry, no rash. Neuro:  Strength and sensation are intact. Psych: Normal affect.   Assessment & Plan    1. CAD s/p CABG X3 - Stable with no anginal symptoms. No indication for ischemic evaluation at this time. GDMT includes aspirin, plavix, statin, beta blocker, PRN nitroglycerin.  2. PAD-  No signs of worsening claudication. Continue to follow with vascular surgery. Continue aspirin, statin, plavix.  3. HTN - BP well controlled. Continue current antihypertensive regimen.  4. HLD -  Continue Atorvastatin 21m daily. 5. GERD - Symptoms well controlled on PPI. 6. Anemia of CKD - Careful titration of antihypertensives and diuretics in setting of CKD. Continue oral iron supplementation. Continue to follow with oncology. 7. DM2 - Continue to follow with PCP.  Disposition: Follow up in 6 month(s) with Dr. GRockey Situor APP  CLoel Dubonnet NP 01/21/2020, 10:58 PM

## 2020-01-20 NOTE — Patient Instructions (Signed)
Medication Instructions:  No medication changes today.   *If you need a refill on your cardiac medications before your next appointment, please call your pharmacy*   Lab Work: No lab work today.   Testing/Procedures: Your EKG today showed normal sinus rhythm and had no signs of worsening blockages.    Follow-Up: At [facility], you and your health needs are our priority.  As part of our continuing mission to provide you with exceptional heart care, we have created designated Provider Care Teams.  These Care Teams include your primary Cardiologist (physician) and Advanced Practice Providers (APPs -  Physician Assistants and Nurse Practitioners) who all work together to provide you with the care you need, when you need it.  We recommend signing up for the patient portal called "MyChart".  Sign up information is provided on this After Visit Summary.  MyChart is used to connect with patients for Virtual Visits (Telemedicine).  Patients are able to view lab/test results, encounter notes, upcoming appointments, etc.  Non-urgent messages can be sent to your provider as well.   To learn more about what you can do with MyChart, go to [email or web address].    Your next appointment:   6 month(s)  The format for your next appointment:   In Person  Provider:   You may see Ida Rogue, MD or one of the following Advanced Practice Providers on your designated Care Team:    Murray Hodgkins, NP  Christell Faith, PA-C  Marrianne Mood, PA-C  Laurann Montana, NP

## 2020-01-21 ENCOUNTER — Ambulatory Visit: Payer: Medicare HMO

## 2020-02-03 ENCOUNTER — Other Ambulatory Visit: Payer: Self-pay | Admitting: *Deleted

## 2020-02-03 MED ORDER — ANASTROZOLE 1 MG PO TABS: 1.0000 mg | ORAL_TABLET | Freq: Every day | ORAL | 0 refills | Status: DC

## 2020-02-04 DIAGNOSIS — Z139 Encounter for screening, unspecified: Secondary | ICD-10-CM | POA: Diagnosis not present

## 2020-02-06 IMAGING — CR ABDOMEN - 2 VIEW
2 series · 2 of 2 positions shown · non-contrast
Comparison: None.

CLINICAL DATA: Constipation.

EXAM:
ABDOMEN - 2 VIEW

[abdomen erect]
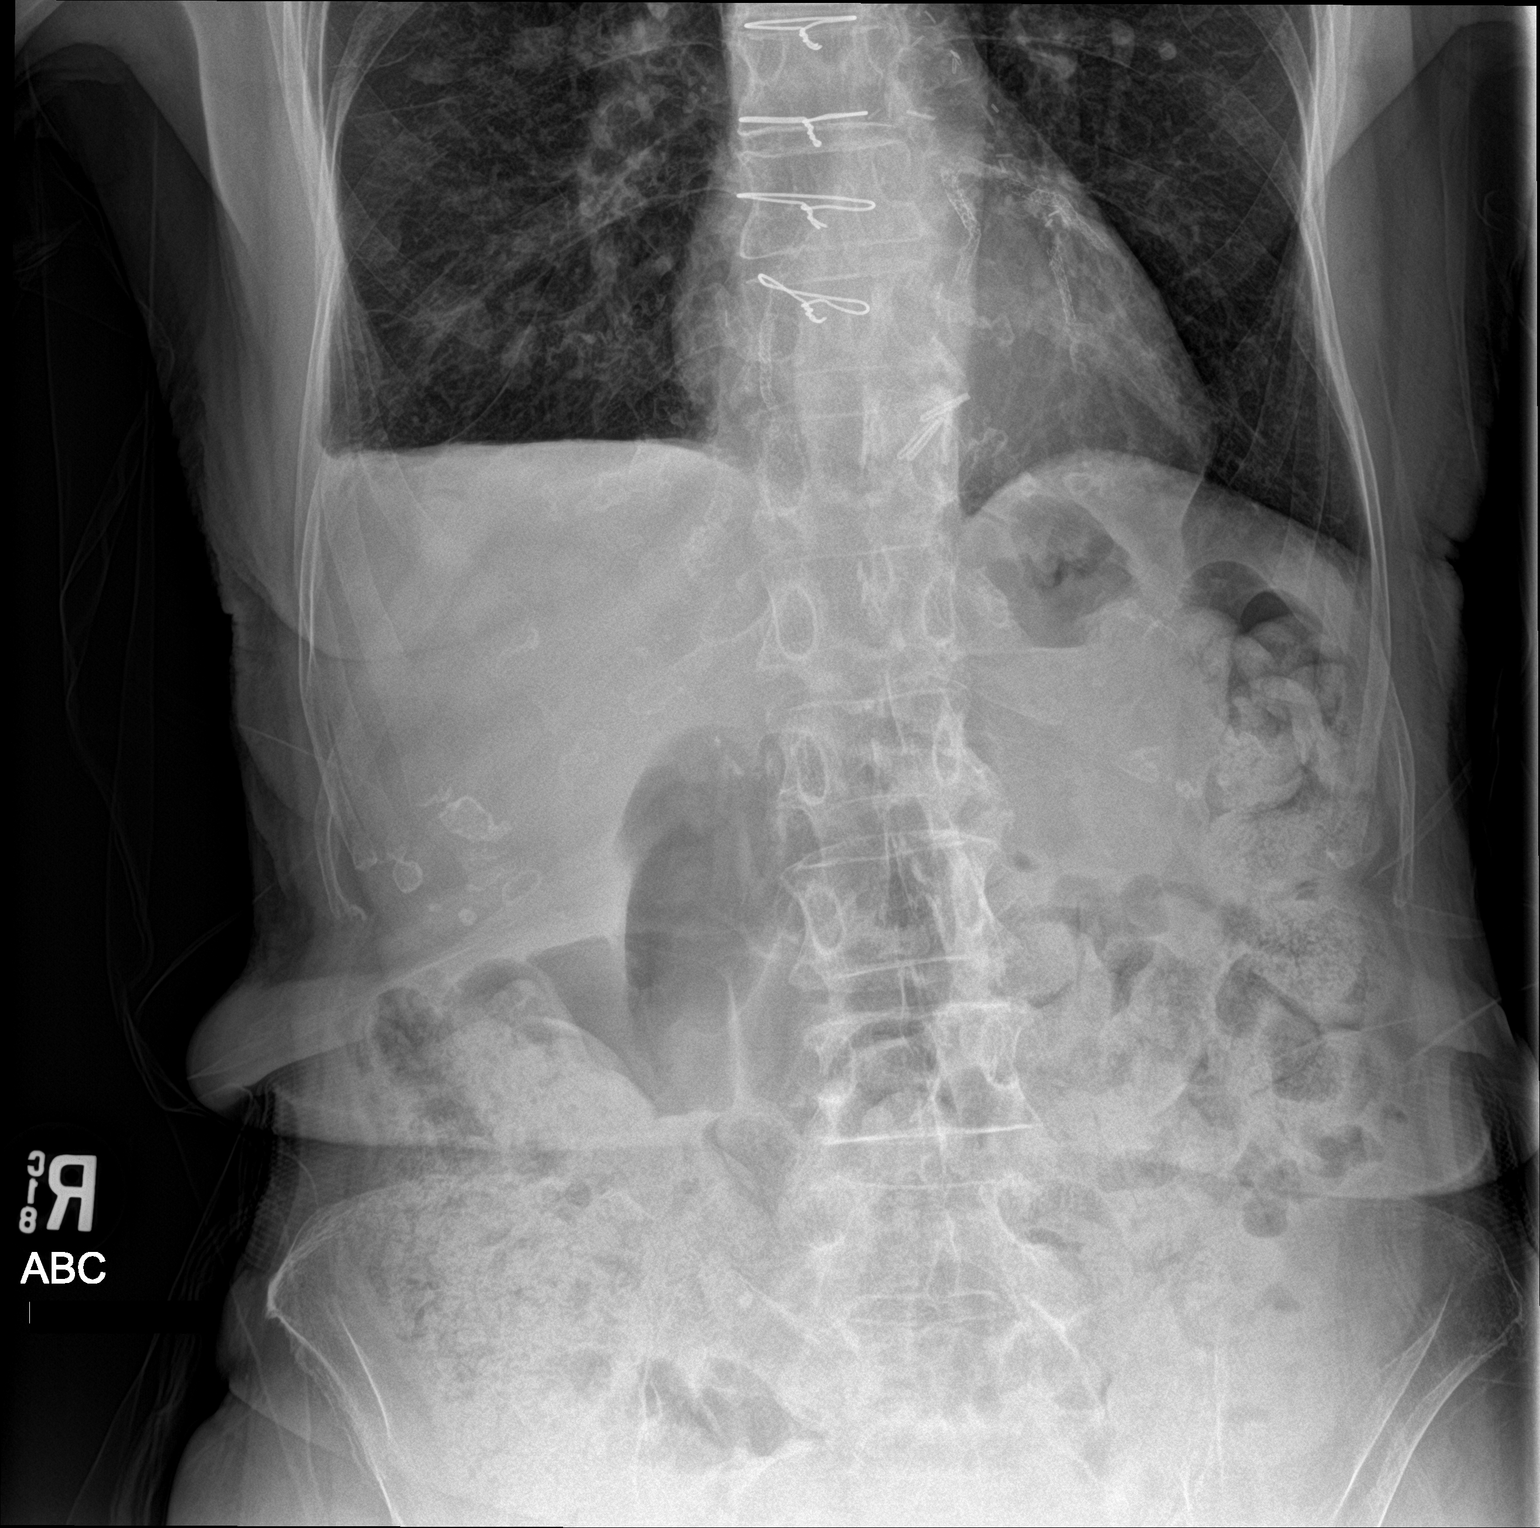

[abdomen supine]
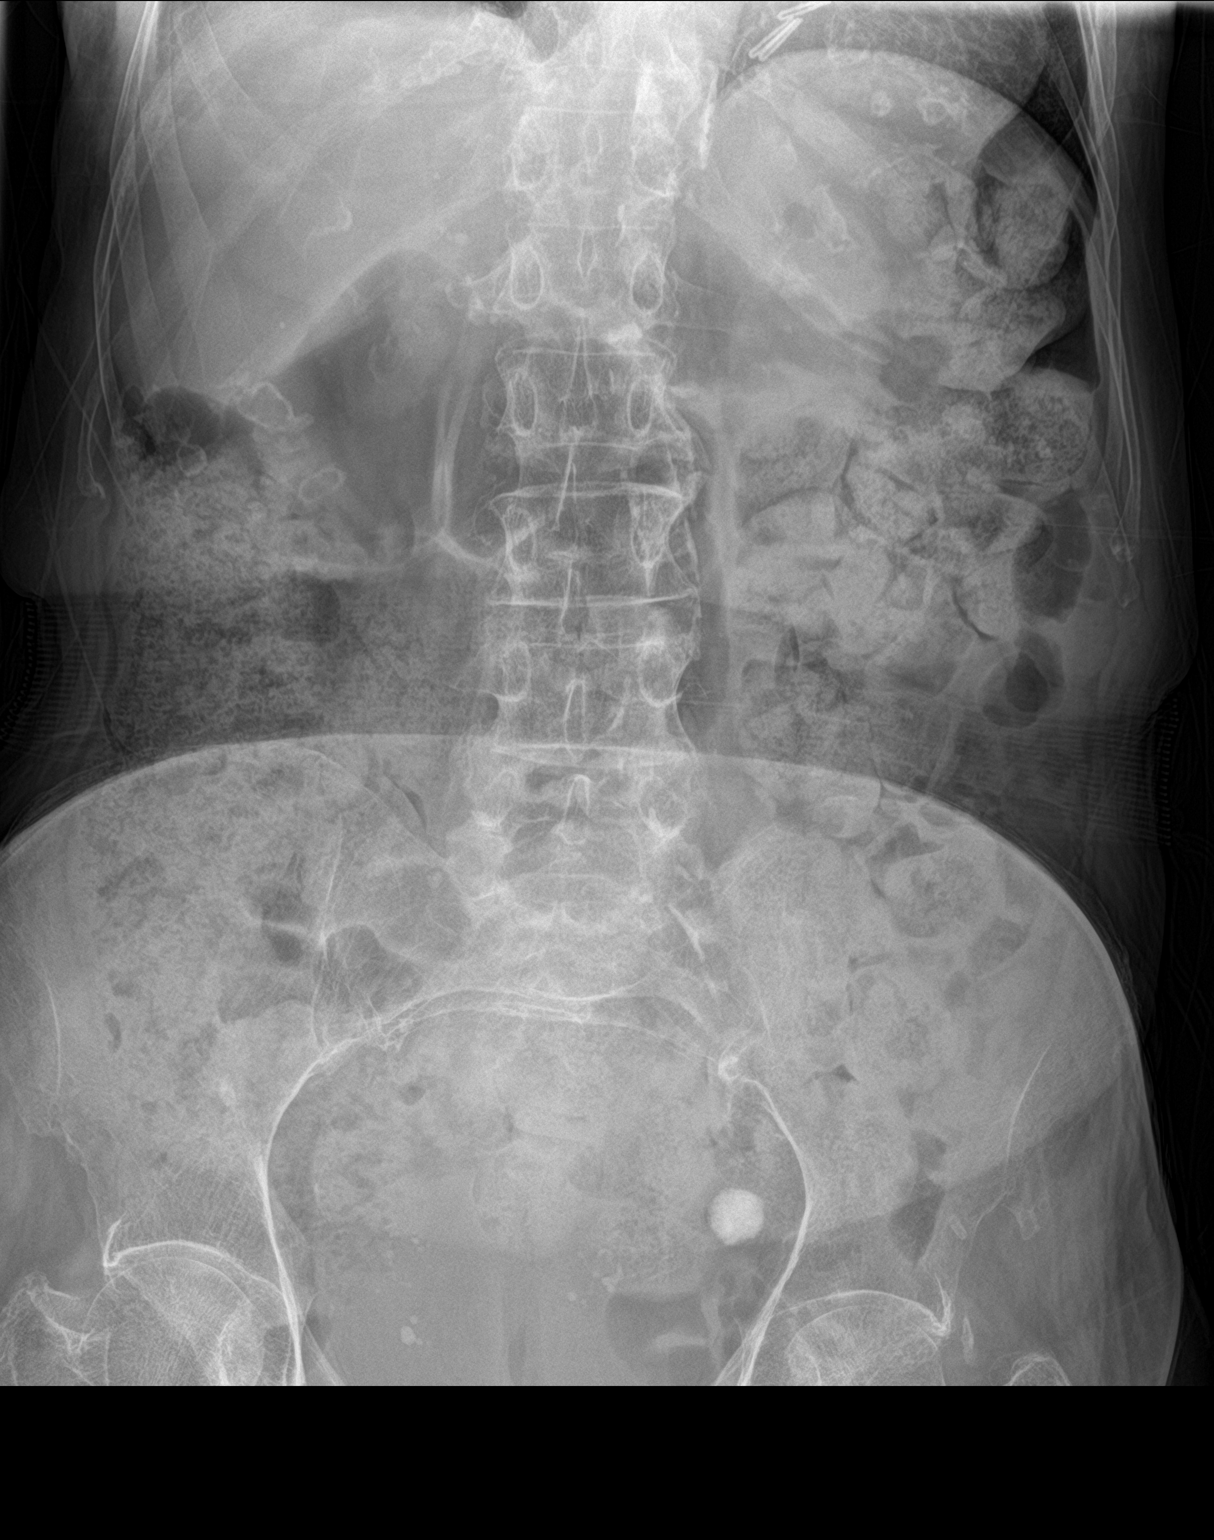

[2 of 2 positions shown; findings below may reference images not displayed]

FINDINGS: Upright film shows no evidence for intraperitoneal free air. Nodular
opacities are seen in the right mid lung. Supine abdomen shows large
stool volume with gaseous distention of transverse colon. No gaseous
small bowel dilatation. Rounded calcification along the left pelvic
sidewall seen on previous CT of 11/04/2014 to be in the right ovary.
Bones are diffusely demineralized.
IMPRESSION: Large colonic stool volume could be compatible with clinical
constipation.

Nodular densities noted in the right mid lung on upright film.
Dedicated two view chest x-ray recommended to further evaluate.

These results will be called to the ordering clinician or
representative by the Radiologist Assistant, and communication
documented in the PACS or zVision Dashboard.

## 2020-02-10 ENCOUNTER — Other Ambulatory Visit: Payer: Medicare Other | Admitting: Nurse Practitioner

## 2020-02-10 ENCOUNTER — Other Ambulatory Visit: Payer: Self-pay

## 2020-02-10 ENCOUNTER — Encounter: Payer: Self-pay | Admitting: Nurse Practitioner

## 2020-02-10 DIAGNOSIS — C50919 Malignant neoplasm of unspecified site of unspecified female breast: Secondary | ICD-10-CM

## 2020-02-10 DIAGNOSIS — Z515 Encounter for palliative care: Secondary | ICD-10-CM | POA: Diagnosis not present

## 2020-02-10 NOTE — Progress Notes (Signed)
Tiffany Bailey Consult Note Telephone: [phone]  Fax: [phone]  PATIENT NAME: Tiffany Bailey DOB: June 05, 1928 MRN: [id number]  PRIMARY CARE PROVIDER:   Lauretta Grill, Bailey  REFERRING PROVIDER:  [name], FNP [address] [place],  [place] [address]   Due to the COVID-19 crisis, this visit was done via telemedicine from my office and it was initiated and consent by this patient and or family.  RESPONSIBLE PARTY:   Self RECOMMENDATIONS and PLAN: 1.ACP: Full code, ongoing family discussion including most form  2. Pain; continue current regimen with tylenol as needed which relieved her headache.   3.Palliative care encounter; Palliative medicine team will continue to support patient, patient's family, and medical team. Visit consisted of counseling and education dealing with the complex and emotionally intense issues of symptom management and palliative care in the setting of serious and potentially life-threatening illness  4. F/u in 4 weeks with ongoing treatment for cancer with oral chemotherapy, ongoing discussions of goc, code status,   I spent 50  minutes providing this consultation,  from 11:00am to 11:50am. More than 50% of the time in this consultation was spent coordinating communication.   HISTORY OF PRESENT ILLNESS:  Tiffany Bailey is a 86 y.o. year old female with multiple medical problems including Coronary artery disease is Plasti cabg with multiple pci's, myocardial infarction, carotid stenosis, hypertension, hyperlipidemia, history of breast s/p mastectomy cancer, chronic kidney disease, diabetes, arthritis, anemia, guard, adult failure to thrive, bilateral cataracts with phaco, bladder surgery, left above knee amputation, abdominal hysterectomy, abdominal tumor removed.I called Tiffany Bailey to confirm in-person palliative care follow-up visits and covid screening. Tiffany Bailey requested that it be changed to  a telemedicine with increased covid she has concerns and is trying to limit visitors. We talked about Tiffany Bailey has been feeling. Tiffany. Bailey endorses that she is doing much better as her headache has revolved. Tiffany Bailey endorses she had a headache and went to the emergency department. Tiffany Bailey endorses Tylenol actually relieve their headache and she was very thankful for that and she can continue to take that as she needs to. We talked about last time ecology visit. We talked about plan with Oncology if she continues her oral chemotherapy. Tiffany Bailey endorses she is having no side effects or concerns about the medication. Tiffany Bailey endorses she does continue to have phantom pain but it's working with Tiffany Bailey [place] calls continues to manage the pain. We talked about her appetite, and courage supplements. We talked about medical goals of care. We talked about quality-of-life. We talked about taking one day at a time. We talked about coping strategies. Tiffany Bailey endorses that she is in the process of moving in with her son till later moved back into her own home. Tiffany Bailey endorses functionally she has been about the same she does require some assistance with ADL. No recent falls, wounds, infection. Tiffany Bailey endorses she wishes to continue to be a full code at present time. We talked about role of palliative care and plan of care. We talked about follow-up visit in four to six weeks if needed or sooner should she declined with ongoing treatment for her cancer. Tiffany. Bailey in agreement with appointment scheduled. Therapeutic listening and emotional support provided. Contact information. Questions answered to satisfaction.  Palliative Care was asked to help to continue to address goals of care.   CODE STATUS: full code  PPS: 50%  HOSPICE ELIGIBILITY/DIAGNOSIS: TBD  PAST MEDICAL HISTORY:  Past Medical History:  Diagnosis Date  . Anemia   . Anginal pain ([place])   . Arthritis   . Breast cancer  ([place]) 07/19/2019  . Cancer (HCC)    BREAST  . CKD (chronic kidney disease), stage III   . Coronary artery disease    S/P CABG and multiple PCI's  . Diabetes mellitus without complication ([place])   . Edema   . Failure to thrive in adult   . GERD (gastroesophageal reflux disease)   . 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 History of breast cancer   . HOH (hard of hearing)   . Hyperlipidemia   . Hypertension   . Myocardial infarction ([place])   . Palpitations     SOCIAL HX:  Social History   Tobacco Use  . Smoking status: Never Smoker  . Smokeless tobacco: Never Used  Substance Use Topics  . Alcohol use: No    ALLERGIES:  Allergies  Allergen Reactions  . Amoxicillin Hives and Nausea Only  . Gabapentin Other (See Comments)    AMS Confusion  . Mirtazapine Itching    Dry mouth with ODT Denies SOB with medication  . Other     UNKNOWN PAIN MEDICATION - unknown reaction     PERTINENT MEDICATIONS:  Outpatient Encounter Medications as of 02/10/2020  Medication Sig  . amLODipine (NORVASC) 10 MG tablet TAKE 1 TABLET BY MOUTH  DAILY  . anastrozole (ARIMIDEX) 1 MG tablet Take 1 tablet (1 mg total) by mouth daily.  Marland Kitchen aspirin EC 81 MG tablet Take 1 tablet (81 mg total) by mouth daily.  Marland Kitchen atorvastatin (LIPITOR) 40 MG tablet TAKE 1 TABLET BY MOUTH  DAILY  . blood glucose meter kit and supplies KIT Dispense based on patient and insurance preference. Use up to four times daily as directed. (FOR ICD-9 250.00, 250.01).  . Chromium-Cinnamon (CINNAMON PLUS CHROMIUM) [phone] MCG-MG CAPS Take 2 capsules by mouth daily.  . clopidogrel (PLAVIX) 75 MG tablet TAKE 1 TABLET BY MOUTH  DAILY  . CORTISPORIN-TC 3.08-17-08-0.5 MG/ML OTIC suspension SMARTSIG:In Ear(s)  . ferrous sulfate 325 (65 FE) MG EC tablet Take 1 tablet (325 mg total) by mouth 2 (two) times daily with a meal.  . fluticasone (FLONASE) 50 MCG/ACT nasal spray Place 1 spray into both nostrils daily.  Marland Kitchen glucose blood (ONE TOUCH TEST  STRIPS) test strip Use to test blood sugar twice daily  . hydrALAZINE (APRESOLINE) 10 MG tablet Take 10 mg by mouth 2 (two) times daily.  Marland Kitchen ibuprofen (ADVIL) 100 MG/5ML suspension Take 200 mg by mouth every 8 (eight) hours as needed.  . Insulin Glargine (BASAGLAR KWIKPEN) 100 UNIT/ML SOPN Inject 0.06 mLs (6 Units total) into the skin daily. [facility] as of  01/21/2019  . Insulin Pen Needle (BD PEN NEEDLE NANO U/F) 32G X 4 MM MISC USE 1 DAILY  . ipratropium (ATROVENT) 0.03 % nasal spray Place 2 sprays into both nostrils every 12 (twelve) hours.  . megestrol (MEGACE) 40 MG/ML suspension   . metoprolol tartrate (LOPRESSOR) 25 MG tablet TAKE 1 TABLET BY MOUTH TWO  TIMES DAILY  . nitroGLYCERIN (NITROSTAT) 0.4 MG SL tablet Place 1 tablet (0.4 mg total) under the tongue every 5 (five) minutes as needed for chest pain.  . ONE TOUCH LANCETS MISC Use as directed 2 times per day  . vitamin B-12 (CYANOCOBALAMIN) 1000 MCG tablet Take 1,000 mcg by mouth daily.   No facility-administered encounter medications on  file as of 02/10/2020.    PHYSICAL EXAM:   Deferred  Keelon Zurn Z Aryianna Earwood, Bailey

## 2020-02-20 IMAGING — CT CT CHEST W/O CM
2 of 3 series · 15 of 36 positions shown, 18 images · non-contrast
Comparison: Chest radiograph, 02/08/2019

CLINICAL DATA: Follow-up lung nodule

EXAM:
CT CHEST WITHOUT CONTRAST
TECHNIQUE: Multidetector CT imaging of the chest was performed following the
standard protocol without IV contrast.

[Series 2: thorax · axial · 0.56mm/px · z∈[-542,-288]mm · 12 of 149 slices shown, 15 images]
[im 11/149  mediastinal]
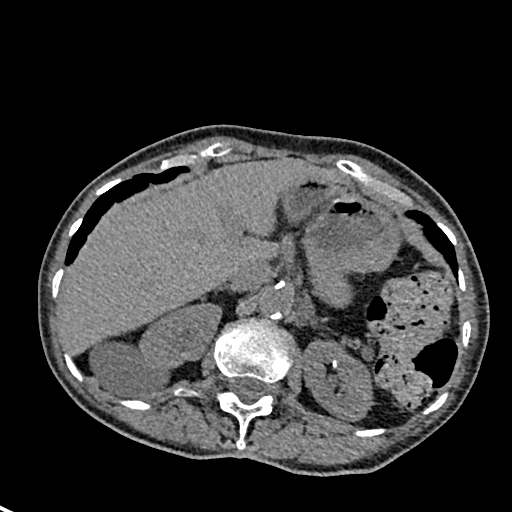
[im 11/149  lung]
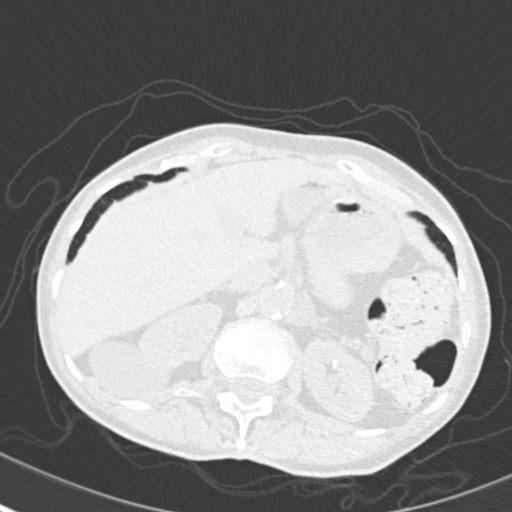
[im 22/149  lung]
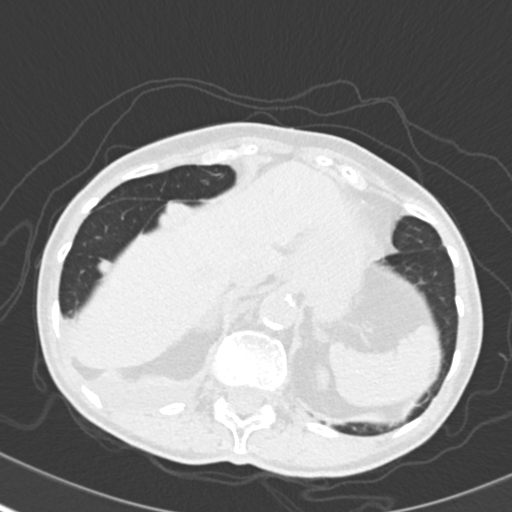
[im 33/149  lung]
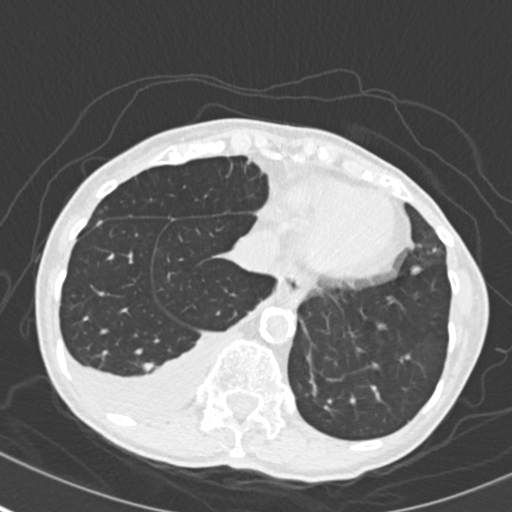
[im 44/149  lung]
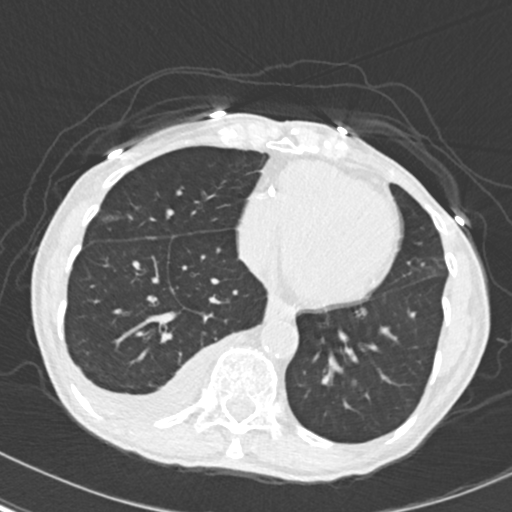
[im 55/149  mediastinal]
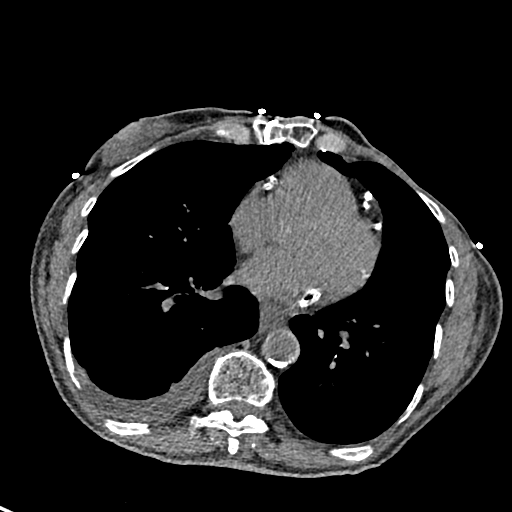
[im 55/149  lung]
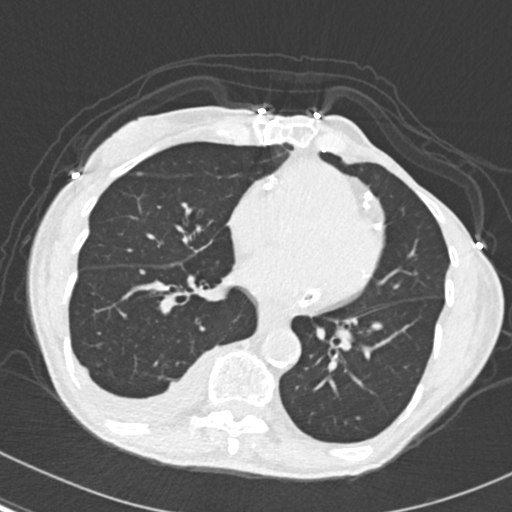
[im 66/149  lung]
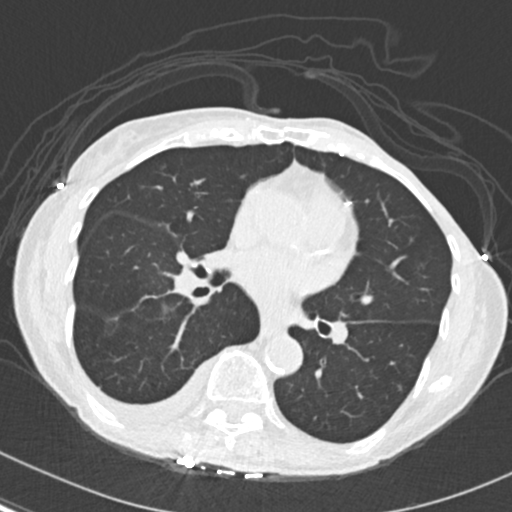
[im 83/149  lung]
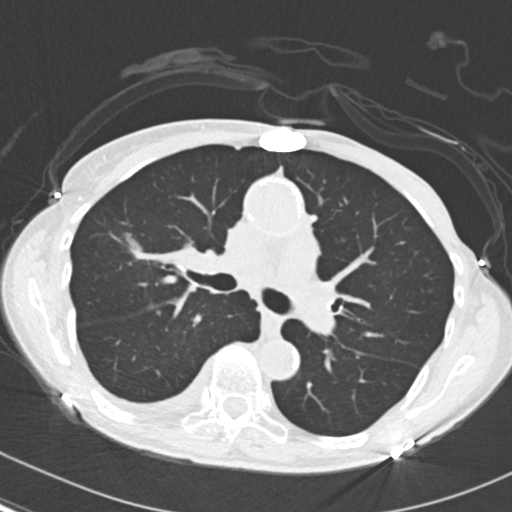
[im 94/149  lung]
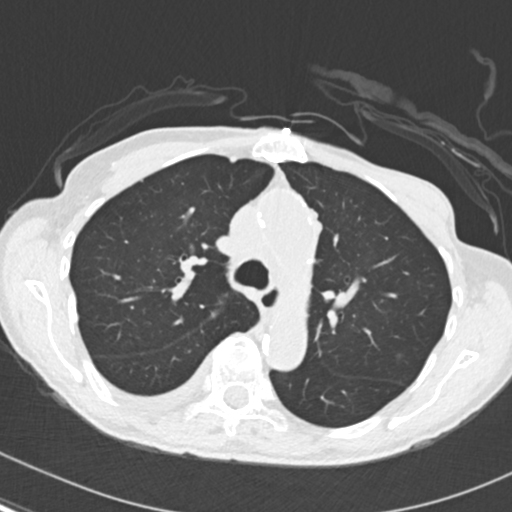
[im 105/149  mediastinal]
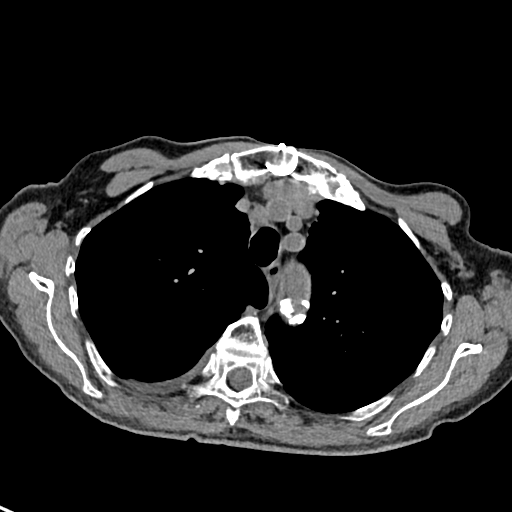
[im 105/149  lung]
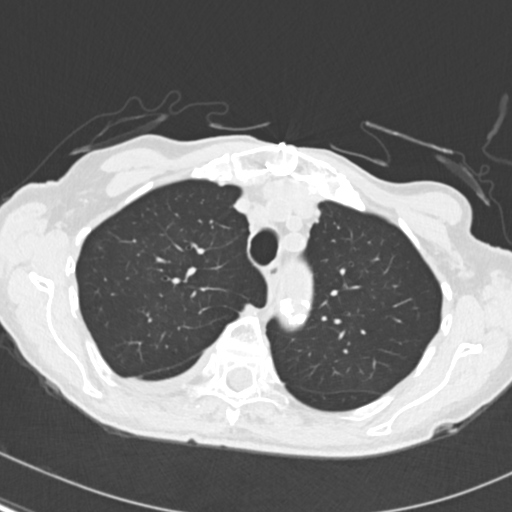
[im 116/149  lung]
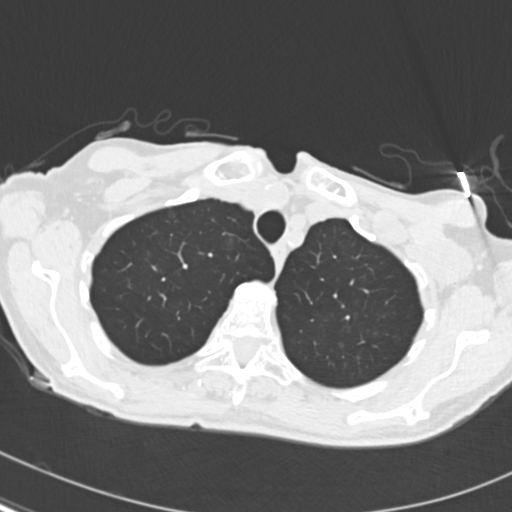
[im 127/149  lung]
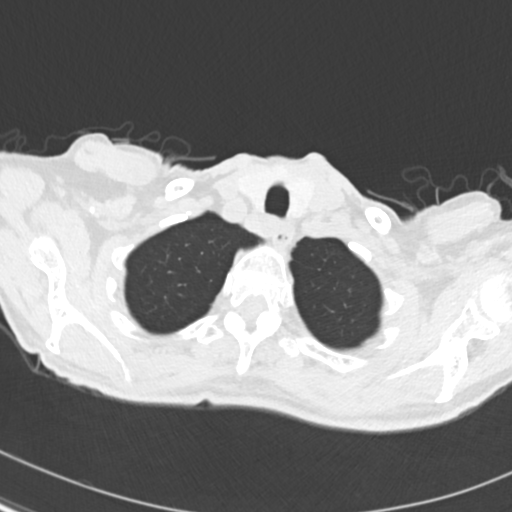
[im 138/149  lung]
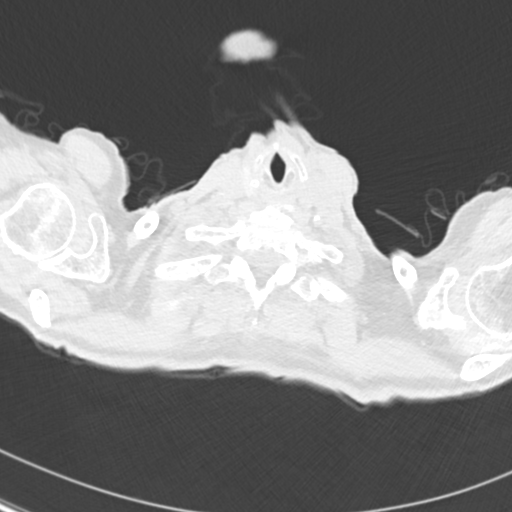

[Series 5: coronal · coronal · 0.60mm/px · 3 of 109 slices shown]
[im 22/109  lung]
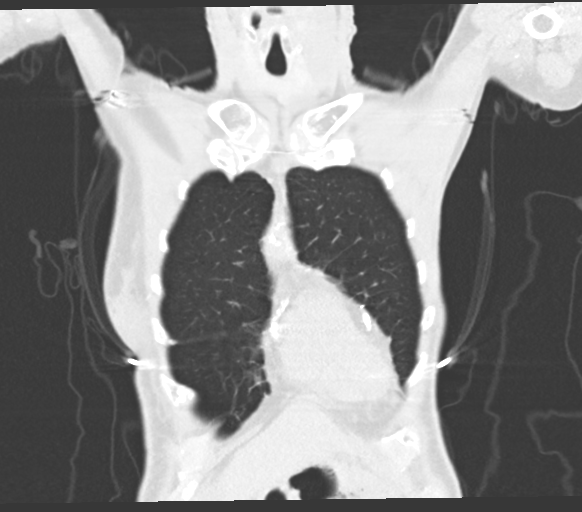
[im 44/109  lung]
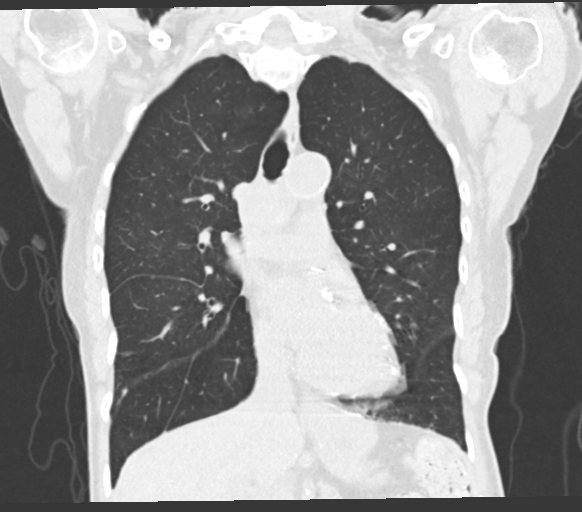
[im 65/109  lung]
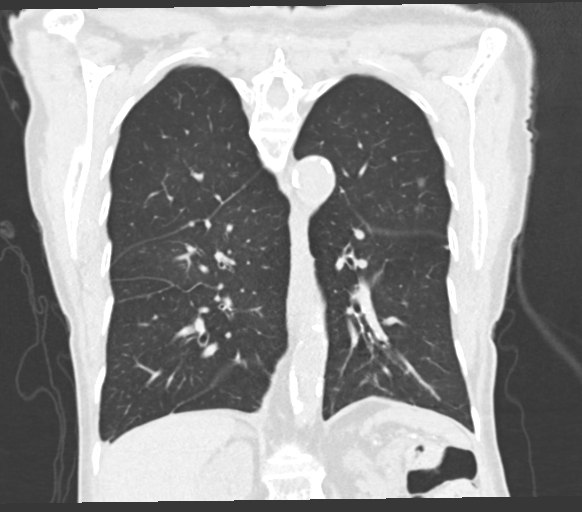

[15 of 36 positions shown; findings below may reference images not displayed]

FINDINGS: Cardiovascular: Aortic atherosclerosis. Normal heart size. Extensive
3 vessel coronary artery calcifications and/or stents. No
pericardial effusion.

Mediastinum/Nodes: Bulky right mediastinal and hilar lymph nodes,
the largest pretracheal nodes measure 3.5 x 3.0 cm (series 2, image
63). There are additional enlarged superior mediastinal,
supraclavicular, and left subpectoral lymph nodes, largest
subpectoral node measuring 3.3 x 2.5 cm (series 2, image 35).
Thyroid gland, trachea, and esophagus demonstrate no significant
findings.

Lungs/Pleura: There are multiple bilateral pulmonary nodules and
ground-glass opacities, the largest solid nodule a fissural nodule
at the confluence of the right major and minor fissures measuring
1.6 cm (series 2, image 76). The most notable ground-glass nodule is
of the right upper lobe and measures 1.8 x 0.8 cm (series 2, image
69). Small right pleural effusion with associated atelectasis or
consolidation.

Upper Abdomen: No acute abnormality. Large exophytic right renal
cyst, partially imaged, and multiple small nonobstructive calculi
and/or vascular calcifications of the partially imaged kidneys.

Musculoskeletal: No chest wall mass or suspicious bone lesions
identified. Status post left mastectomy.
IMPRESSION: 1. There are multiple bilateral pulmonary nodules and ground-glass
opacities, the largest solid nodule a fissural nodule at the
confluence of the right major and minor fissures measuring 1.6 cm
(series 2, image 76). The majority of solid pulmonary nodules are
present along the fissures and pleural surfaces. The most notable
ground-glass nodule is of the right upper lobe and measures 1.8 x
0.8 cm (series 2, image 69). Small right pleural effusion with
associated atelectasis or consolidation. The larger solid nodules
are highly suspicious for metastatic disease although some smaller
and ground-glass nodules are likely incidental and benign sequelae
of infection or inflammation.

2. Bulky right mediastinal and hilar lymph nodes, the largest
pretracheal nodes measure 3.5 x 3.0 cm (series 2, image 63). There
are additional bulky superior mediastinal, supraclavicular, and left
subpectoral lymph nodes, largest subpectoral node measuring 3.3 x
2.5 cm (series 2, image 35).

3. Given the presence of left mastectomy, findings are suspicious
for recurrent breast malignancy although primary lung malignancy as
well as lymphoma are differential considerations.

4.  Coronary artery disease and aortic atherosclerosis.

## 2020-02-23 DIAGNOSIS — Z794 Long term (current) use of insulin: Secondary | ICD-10-CM | POA: Diagnosis not present

## 2020-02-23 DIAGNOSIS — I129 Hypertensive chronic kidney disease with stage 1 through stage 4 chronic kidney disease, or unspecified chronic kidney disease: Secondary | ICD-10-CM | POA: Diagnosis not present

## 2020-02-23 DIAGNOSIS — E1165 Type 2 diabetes mellitus with hyperglycemia: Secondary | ICD-10-CM | POA: Diagnosis not present

## 2020-02-23 DIAGNOSIS — E1122 Type 2 diabetes mellitus with diabetic chronic kidney disease: Secondary | ICD-10-CM | POA: Diagnosis not present

## 2020-02-23 DIAGNOSIS — E782 Mixed hyperlipidemia: Secondary | ICD-10-CM | POA: Diagnosis not present

## 2020-03-08 ENCOUNTER — Other Ambulatory Visit: Payer: Self-pay

## 2020-03-08 ENCOUNTER — Inpatient Hospital Stay (HOSPITAL_BASED_OUTPATIENT_CLINIC_OR_DEPARTMENT_OTHER): Payer: Medicare Other | Admitting: Oncology

## 2020-03-08 ENCOUNTER — Encounter: Payer: Self-pay | Admitting: Oncology

## 2020-03-08 ENCOUNTER — Inpatient Hospital Stay: Payer: Medicare Other | Attending: Oncology

## 2020-03-08 VITALS — BP 185/70 | HR 67 | Temp 97.6°F | Resp 18 | Wt 86.5 lb

## 2020-03-08 DIAGNOSIS — R519 Headache, unspecified: Secondary | ICD-10-CM

## 2020-03-08 DIAGNOSIS — I129 Hypertensive chronic kidney disease with stage 1 through stage 4 chronic kidney disease, or unspecified chronic kidney disease: Secondary | ICD-10-CM | POA: Insufficient documentation

## 2020-03-08 DIAGNOSIS — G8929 Other chronic pain: Secondary | ICD-10-CM

## 2020-03-08 DIAGNOSIS — Z79811 Long term (current) use of aromatase inhibitors: Secondary | ICD-10-CM | POA: Insufficient documentation

## 2020-03-08 DIAGNOSIS — I252 Old myocardial infarction: Secondary | ICD-10-CM | POA: Diagnosis not present

## 2020-03-08 DIAGNOSIS — M8580 Other specified disorders of bone density and structure, unspecified site: Secondary | ICD-10-CM

## 2020-03-08 DIAGNOSIS — D571 Sickle-cell disease without crisis: Secondary | ICD-10-CM | POA: Insufficient documentation

## 2020-03-08 DIAGNOSIS — C50919 Malignant neoplasm of unspecified site of unspecified female breast: Secondary | ICD-10-CM

## 2020-03-08 DIAGNOSIS — N1831 Chronic kidney disease, stage 3a: Secondary | ICD-10-CM

## 2020-03-08 DIAGNOSIS — Z803 Family history of malignant neoplasm of breast: Secondary | ICD-10-CM | POA: Diagnosis not present

## 2020-03-08 DIAGNOSIS — Z139 Encounter for screening, unspecified: Secondary | ICD-10-CM | POA: Diagnosis not present

## 2020-03-08 DIAGNOSIS — Z9010 Acquired absence of unspecified breast and nipple: Secondary | ICD-10-CM | POA: Diagnosis not present

## 2020-03-08 DIAGNOSIS — D631 Anemia in chronic kidney disease: Secondary | ICD-10-CM

## 2020-03-08 DIAGNOSIS — E1122 Type 2 diabetes mellitus with diabetic chronic kidney disease: Secondary | ICD-10-CM | POA: Insufficient documentation

## 2020-03-08 DIAGNOSIS — Z90710 Acquired absence of both cervix and uterus: Secondary | ICD-10-CM | POA: Diagnosis not present

## 2020-03-08 DIAGNOSIS — Z89612 Acquired absence of left leg above knee: Secondary | ICD-10-CM | POA: Insufficient documentation

## 2020-03-08 LAB — COMPREHENSIVE METABOLIC PANEL
ALT: 18 U/L (ref 0–44)
AST: 22 U/L (ref 15–41)
Albumin: 3.7 g/dL (ref 3.5–5.0)
Alkaline Phosphatase: 122 U/L (ref 38–126)
Anion gap: 9 (ref 5–15)
BUN: 35 mg/dL — ABNORMAL HIGH (ref 8–23)
CO2: 20 mmol/L — ABNORMAL LOW (ref 22–32)
Calcium: 9 mg/dL (ref 8.9–10.3)
Chloride: 108 mmol/L (ref 98–111)
Creatinine, Ser: 1.41 mg/dL — ABNORMAL HIGH (ref 0.44–1.00)
GFR calc Af Amer: 38 mL/min — ABNORMAL LOW (ref >60)
GFR calc non Af Amer: 33 mL/min — ABNORMAL LOW (ref >60)
Glucose, Bld: 223 mg/dL — ABNORMAL HIGH (ref 70–99)
Potassium: 4.1 mmol/L (ref 3.5–5.1)
Sodium: 137 mmol/L (ref 135–145)
Total Bilirubin: 0.5 mg/dL (ref 0.3–1.2)
Total Protein: 7.1 g/dL (ref 6.5–8.1)

## 2020-03-08 LAB — CBC WITH DIFFERENTIAL/PLATELET
Abs Immature Granulocytes: 0.03 10*3/uL (ref 0.00–0.07)
Basophils Absolute: 0.1 10*3/uL (ref 0.0–0.1)
Basophils Relative: 1 %
Eosinophils Absolute: 0.5 10*3/uL (ref 0.0–0.5)
Eosinophils Relative: 8 %
HCT: 31.1 % — ABNORMAL LOW (ref 36.0–46.0)
Hemoglobin: 10.6 g/dL — ABNORMAL LOW (ref 12.0–15.0)
Immature Granulocytes: 1 %
Lymphocytes Relative: 16 %
Lymphs Abs: 1 10*3/uL (ref 0.7–4.0)
MCH: 30.5 pg (ref 26.0–34.0)
MCHC: 34.1 g/dL (ref 30.0–36.0)
MCV: 89.4 fL (ref 80.0–100.0)
Monocytes Absolute: 0.5 10*3/uL (ref 0.1–1.0)
Monocytes Relative: 8 %
Neutro Abs: 4.1 10*3/uL (ref 1.7–7.7)
Neutrophils Relative %: 66 %
Platelets: 349 10*3/uL (ref 150–400)
RBC: 3.48 MIL/uL — ABNORMAL LOW (ref 3.87–5.11)
RDW: 13.5 % (ref 11.5–15.5)
WBC: 6.2 10*3/uL (ref 4.0–10.5)
nRBC: 0 % (ref 0.0–0.2)

## 2020-03-08 NOTE — Progress Notes (Signed)
Hematology/Oncology  Follow up note [facility] Telephone:([phone]) [phone] [phone]   Patient Care Team: Lauretta Grill, NP as PCP - General (Nurse Practitioner) Minna Merritts, MD as PCP - Cardiology (Cardiology) Lauretta Grill, NP as Nurse Practitioner (Nurse Practitioner)  REFERRING PROVIDER: [name], FNP  CHIEF COMPLAINTS/REASON FOR VISIT:  Follow up for breast cancer  HISTORY OF PRESENTING ILLNESS:   Tiffany Bailey is a  86 y.o.  female with PMH listed below was seen in consultation at the request of  [name], FNP  for evaluation of abnormal CT scan. Patient was recently evaluated by primary care provider for complaints of urinary retention, headache, constipation. 02/03/2019 x-ray of abdomen showed large colonic stool volume, nodular densities noted in the right mid lung are up). 02/08/2019 subsequent chest x-ray two-view showed hyperinflation of the lungs compatible with COPD.  Multiple nodular densities project over the mid and lower right lung. CT chest was obtained for further evaluation. 02/17/2019 CT chest without contrast Showed multiple bilateral pulmonary nodules,groundglass opacities. Largest solid nodule at the confluence of the right major and minor fissures measuring 1.6 cm.  Most notable groundglass nodule is of the right upper lobe and measure 1.8 x 0.8 cm, small right pleural effusion with associated atelectasis or consolidation.  Larger solid nodule are highly suspicious for metastatic disease. Bulky right mediastinal and hilar lymph node.  Largest pretracheal nodes measuring 3.5 x 3 cm.  There are additional bulky superior mediastinal, supraclavicular, left subpectoral lymph nodes.  Largest subpectoral nodes measuring 3.3 x 2.5 cm.  Patient was sent to cancer center for further evaluation.  #Patient reports a history of left breast cancer status post mastectomy.  She is a poor historian.  She is not able to recall  the timeframe of breast cancer diagnosis and then treatments.  Denies any chemotherapy or radiation treatments.  #Left lower extremity history of amputation, CKD, History of MI, Sickle cell disease, DM, HTN, anemia.   Patient denies shortness of breath, chest pain, hemoptysis.  Night sweating, abdominal pain. Reports intentional weight loss.  Appetite is poor.  Not eating much. Also have wax and wane, chronic intermittent headache, she takes Tylenol as needed, with some relief.  Denies any nausea vomiting. She sees neurology. 11/10/2018 CT head showed no acute intracranial abnormality or significant interval changes.  Stable atrophy and white matter disease.  Atherosclerosis.  #Patient had a consultation visit with me on 02/19/2019.  At that time he was referred for abnormal CT scan.  PET scan was obtained and I recommend patient to obtain left axillary lymph node biopsy.  Patient/family called back and decided not to proceed with any additional work-up.  #February 2020 started Arimidex INTERVAL HISTORY [name] is a 86 y.o. female who has above history reviewed by me today presents for follow up visit for management of metastatic breast cancer. Problems and complaints are listed below: Patient continues to have intermittent headache. She has chronic sinus problem, and she reports some left facial pain 2. Patient takes Arimidex daily.  She denies any shortness of breath, cough, Patient was accompanied by her daughter. Review of Systems  Constitutional: Positive for fatigue. Negative for appetite change, chills, fever and unexpected weight change.  HENT:   Negative for hearing loss and voice change.   Eyes: Negative for eye problems.  Respiratory: Negative for chest tightness, cough and shortness of breath.   Cardiovascular: Negative for chest pain.  Gastrointestinal: Negative for abdominal distention, abdominal pain, blood  in stool and constipation.  Endocrine: Negative for hot flashes.   Genitourinary: Negative for difficulty urinating and frequency.   Musculoskeletal: Negative for arthralgias.       Left lower extremity history of amputation, intermittent sharp pain  Skin: Negative for itching and rash.  Neurological: Positive for headaches. Negative for extremity weakness.  Hematological: Negative for adenopathy.  Psychiatric/Behavioral: Negative for confusion.    MEDICAL HISTORY:  Past Medical History:  Diagnosis Date  . Anemia   . Anginal pain ([place])   . Arthritis   . Breast cancer ([place]) 07/19/2019  . Cancer (HCC)    BREAST  . CKD (chronic kidney disease), stage III   . Coronary artery disease    S/P CABG and multiple PCI's  . Diabetes mellitus without complication ([place])   . Edema   . Failure to thrive in adult   . GERD (gastroesophageal reflux disease)   . Hb-SS disease without crisis ([place]) 10/23/2017  . History of breast cancer   . HOH (hard of hearing)   . Hyperlipidemia   . Hypertension   . Myocardial infarction ([place])   . Palpitations     SURGICAL HISTORY: Past Surgical History:  Procedure Laterality Date  . ABDOMINAL HYSTERECTOMY    . ABOVE KNEE LEG AMPUTATION Left 2013  . ANTERIOR VITRECTOMY Left 11/16/2015   Procedure: ANTERIOR VITRECTOMY;  Surgeon: Eulogio Bear, MD;  Location: [facility] ORS;  Service: Ophthalmology;  Laterality: Left;  . BLADDER SURGERY    . CATARACT EXTRACTION W/PHACO Left 11/16/2015   Procedure: CATARACT EXTRACTION PHACO AND INTRAOCULAR LENS PLACEMENT (IOC);  Surgeon: Eulogio Bear, MD;  Location: [facility];  Service: Ophthalmology;  Laterality: Left;  Lot # [id number] H [place]; 01:22.8 AP%:12.6 CDE: 10.46  . CATARACT EXTRACTION W/PHACO Right 08/22/2016   Procedure: CATARACT EXTRACTION PHACO AND INTRAOCULAR LENS PLACEMENT (IOC);  Surgeon: Eulogio Bear, MD;  Location: [facility] ORS;  Service: Ophthalmology;  Laterality: Right;  Lot # [id number] H [place]: 00:52.1 AP%:8.7 CDE: 4.99   . CORONARY ANGIOPLASTY     STENT  . CORONARY ARTERY  BYPASS GRAFT    . LOWER EXTREMITY ANGIOGRAPHY Right 01/14/2019   Procedure: LOWER EXTREMITY ANGIOGRAPHY;  Surgeon: Algernon Huxley, MD;  Location: [facility];  Service: Cardiovascular;  Laterality: Right;  . MASTECTOMY    . TUMOR REMOVAL     ABDOMINAL    SOCIAL HISTORY: Social History   Socioeconomic History  . Marital status: Divorced    Spouse name: Not on file  . Number of children: 7  . Years of education: Not on file  . Highest education level: Not on file  Occupational History  . 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 Smoking status: Never Smoker  . Smokeless tobacco: Never Used  Substance and Sexual Activity  . Alcohol use: No  . Drug use: No  . Sexual activity: Not Currently  Other Topics Concern  . Not on file  Social History Narrative  . Not on file   Social Determinants of Health   Financial Resource Strain:   . Difficulty of Paying Living Expenses: Not on file  Food Insecurity:   . Worried About Charity fundraiser in the Last Year: Not on file  . Ran Out of Food in the Last Year: Not on file  Transportation Needs:   . Lack of Transportation (Medical): Not on file  . Lack of Transportation (Non-Medical): Not on file  Physical Activity:   .  Days of Exercise per Week: Not on file  . Minutes of Exercise per Session: Not on file  Stress:   . Feeling of Stress : Not on file  Social Connections:   . Frequency of Communication with Friends and Family: Not on file  . Frequency of Social Gatherings with Friends and Family: Not on file  . Attends Religious Services: Not on file  . Active Member of Clubs or Organizations: Not on file  . Attends Archivist Meetings: Not on file  . Marital Status: Not on file  Intimate Partner Violence:   . Fear of Current or Ex-Partner: Not on file  . Emotionally Abused: Not on file  . Physically Abused: Not on file  . Sexually Abused: Not on file    FAMILY HISTORY: Family History   Problem Relation Age of Onset  . Sudden death Mother   . Arthritis Father   . Stroke Father   . Breast cancer Sister   . Diabetes Grandchild     ALLERGIES:  is allergic to amoxicillin, gabapentin, mirtazapine, and other.  MEDICATIONS:  Current Outpatient Medications  Medication Sig Dispense Refill  . amLODipine (NORVASC) 10 MG tablet TAKE 1 TABLET BY MOUTH  DAILY 90 tablet 0  . anastrozole (ARIMIDEX) 1 MG tablet Take 1 tablet (1 mg total) by mouth daily. 90 tablet 0  . aspirin EC 81 MG tablet Take 1 tablet (81 mg total) by mouth daily. 30 tablet 5  . atorvastatin (LIPITOR) 40 MG tablet TAKE 1 TABLET BY MOUTH  DAILY 90 tablet 3  . blood glucose meter kit and supplies KIT Dispense based on patient and insurance preference. Use up to four times daily as directed. (FOR ICD-9 250.00, 250.01). 1 each 0  . clopidogrel (PLAVIX) 75 MG tablet TAKE 1 TABLET BY MOUTH  DAILY 90 tablet 0  . CORTISPORIN-TC 3.08-17-08-0.5 MG/ML OTIC suspension SMARTSIG:In Ear(s)    . glucose blood (ONE TOUCH TEST STRIPS) test strip Use to test blood sugar twice daily 200 each 3  . hydrALAZINE (APRESOLINE) 10 MG tablet Take 10 mg by mouth 2 (two) times daily.    Marland Kitchen ibuprofen (ADVIL) 100 MG/5ML suspension Take 200 mg by mouth every 8 (eight) hours as needed.    . Insulin Glargine (BASAGLAR KWIKPEN) 100 UNIT/ML SOPN Inject 0.06 mLs (6 Units total) into the skin daily. [facility] as of  01/21/2019    . Insulin Pen Needle (BD PEN NEEDLE NANO U/F) 32G X 4 MM MISC USE 1 DAILY 100 each 12  . ipratropium (ATROVENT) 0.03 % nasal spray Place 2 sprays into both nostrils every 12 (twelve) hours. 30 mL 12  . megestrol (MEGACE) 40 MG/ML suspension     . metoprolol tartrate (LOPRESSOR) 25 MG tablet TAKE 1 TABLET BY MOUTH TWO  TIMES DAILY 180 tablet 3  . nitroGLYCERIN (NITROSTAT) 0.4 MG SL tablet Place 1 tablet (0.4 mg total) under the tongue every 5 (five) minutes as needed for chest pain. 25 tablet 4  . ONE TOUCH LANCETS MISC Use as  directed 2 times per day 200 each 3  . Chromium-Cinnamon (CINNAMON PLUS CHROMIUM) [phone] MCG-MG CAPS Take 2 capsules by mouth daily. (Patient not taking: Reported on 03/08/2020)    . ferrous sulfate 325 (65 FE) MG EC tablet Take 1 tablet (325 mg total) by mouth 2 (two) times daily with a meal. (Patient not taking: Reported on 03/08/2020) 60 tablet 1  . fluticasone (FLONASE) 50 MCG/ACT nasal spray Place 1 spray into both  nostrils daily. (Patient not taking: Reported on 03/08/2020) 16 g 5  . vitamin B-12 (CYANOCOBALAMIN) 1000 MCG tablet Take 1,000 mcg by mouth daily. (Patient not taking: Reported on 03/08/2020)     No current facility-administered medications for this visit.     PHYSICAL EXAMINATION: ECOG PERFORMANCE STATUS: 2 - Symptomatic, <50% confined to bed Vitals:   03/08/20 1349  BP: (!) 185/70  Pulse: 67  Resp: 18  Temp: 97.6 F (36.4 C)   Filed Weights   03/08/20 1349  Weight: 86 lb 8 oz (39.2 kg)    Physical Exam Constitutional:      General: She is not in acute distress.    Comments: She sits in the wheel chair.  Frail appearance  HENT:     Head: Normocephalic and atraumatic.  Eyes:     General: No scleral icterus.    Pupils: Pupils are equal, round, and reactive to light.  Cardiovascular:     Rate and Rhythm: Normal rate and regular rhythm.     Heart sounds: Normal heart sounds.  Pulmonary:     Effort: Pulmonary effort is normal. No respiratory distress.     Breath sounds: No wheezing.     Comments: Decreased breath sound right lower lobe.  Good air entry on the left base.  Abdominal:     General: Bowel sounds are normal. There is no distension.     Palpations: Abdomen is soft.  Musculoskeletal:        General: No deformity. Normal range of motion.     Cervical back: Normal range of motion and neck supple.     Comments: Left lower extremity history of amputation.   Skin:    General: Skin is warm and dry.     Findings: No erythema or rash.  Neurological:      Mental Status: She is alert and oriented to person, place, and time. Mental status is at baseline.     Cranial Nerves: No cranial nerve deficit.     Coordination: Coordination normal.  Psychiatric:        Mood and Affect: Mood normal.      LABORATORY DATA:  I have reviewed the data as listed Lab Results  Component Value Date   WBC 6.2 03/08/2020   HGB 10.6 (L) 03/08/2020   HCT 31.1 (L) 03/08/2020   MCV 89.4 03/08/2020   PLT 349 03/08/2020   Recent Labs    10/25/19 1247 12/08/19 1252 03/08/20 1255  NA 134* 135 137  K 3.6 3.9 4.1  CL 106 104 108  CO2 20* 21* 20*  GLUCOSE 203* 301* 223*  BUN 31* 27* 35*  CREATININE 1.22* 1.17* 1.41*  CALCIUM 8.7* 8.6* 9.0  GFRNONAA 39* 41* 33*  GFRAA 45* 48* 38*  PROT 6.5 7.0 7.1  ALBUMIN 3.4* 3.6 3.7  AST _0 ALT 33 24 18  ALKPHOS 81 102 122  BILITOT 0.7 0.5 0.5   Iron/TIBC/Ferritin/ %Sat    Component Value Date/Time   IRON 68 09/13/2019 1326   TIBC 260 09/13/2019 1326   FERRITIN 62 09/13/2019 1326   IRONPCTSAT 26 09/13/2019 1326   IRONPCTSAT 27 06/19/2018 1006      RADIOGRAPHIC STUDIES: I have personally reviewed the radiological images as listed and agreed with the findings in the report. No results found.    ASSESSMENT & PLAN:  1. Metastatic breast cancer ([place])   2. Use of anastrozole (Arimidex)   3. Anemia due to stage 3a chronic kidney disease  4. Osteopenia, unspecified location   5. Chronic nonintractable headache, unspecified headache type    #Metastatic breast cancer,  10/19/2019 CT showed partial response.  Labs are reviewed and discussed with patient. Continue Arimidex 1 mg daily. I will obtain CT chest for evaluation of treatment response.  Osteopenia, bone density scan results were discussed with patient and daughter.  She has baseline osteopenia. Recommend patient to take calcium and vitamin D supplementation. Discussed about bisphosphonate.  Await for dental clearance.  # uncontrolled  diabetes.  Today glucose count was 223.  Recommend patient to continue follow-up with primary care provider for diabetic regimen. # Anemia of CKD, hemoglobin is above 10, no intervention needed.  #Intermittent headache, sinus problem.  This seems to be a chronic issue for patient.  Recommend patient to start Claritin or allergra.  She has had a CT head without contrast done in May 2021, no signs of metastasis.  Consider repeat brain images in the future Orders Placed This Encounter  Procedures  . CT Chest Wo Contrast    Standing Status:   Future    Standing Expiration Date:   03/08/2021    Order Specific Question:   Preferred imaging location?    Answer:   [facility]    Order Specific Question:   Radiology Contrast Protocol - do NOT remove file path    Answer:   [email or web address]    All questions were answered. The patient knows to call the clinic with any problems questions or concerns. Return of visit:  3 months.   [name], MD, PhD Hematology Oncology [facility] Pager- [phone] 03/08/2020

## 2020-03-08 NOTE — Progress Notes (Signed)
Patient reports a headache located on left side and left eye for the past week.  The pain is 10/10 on pain scale right now.  Also has low mid back pain that radiates to the right side.  The pain started after lifting a chair 1 week ago.

## 2020-03-21 ENCOUNTER — Ambulatory Visit
Admission: RE | Admit: 2020-03-21 | Discharge: 2020-03-21 | Disposition: A | Discharge status: Home / Self Care | Payer: Medicare Other | Source: Ambulatory Visit | Attending: Oncology | Admitting: Oncology

## 2020-03-21 ENCOUNTER — Other Ambulatory Visit: Payer: Self-pay

## 2020-03-21 DIAGNOSIS — C50912 Malignant neoplasm of unspecified site of left female breast: Secondary | ICD-10-CM | POA: Diagnosis not present

## 2020-03-21 DIAGNOSIS — J90 Pleural effusion, not elsewhere classified: Secondary | ICD-10-CM | POA: Insufficient documentation

## 2020-03-21 DIAGNOSIS — I700 Atherosclerosis of aorta: Secondary | ICD-10-CM | POA: Diagnosis not present

## 2020-03-21 DIAGNOSIS — C7801 Secondary malignant neoplasm of right lung: Secondary | ICD-10-CM | POA: Diagnosis not present

## 2020-03-21 DIAGNOSIS — C50919 Malignant neoplasm of unspecified site of unspecified female breast: Secondary | ICD-10-CM

## 2020-03-21 DIAGNOSIS — C7802 Secondary malignant neoplasm of left lung: Secondary | ICD-10-CM | POA: Insufficient documentation

## 2020-03-22 ENCOUNTER — Ambulatory Visit: Payer: Medicare Other | Admitting: Family

## 2020-03-28 ENCOUNTER — Other Ambulatory Visit: Payer: Medicare Other | Admitting: Nurse Practitioner

## 2020-03-28 ENCOUNTER — Other Ambulatory Visit: Payer: Self-pay

## 2020-03-28 ENCOUNTER — Encounter: Payer: Self-pay | Admitting: Nurse Practitioner

## 2020-03-28 DIAGNOSIS — C50919 Malignant neoplasm of unspecified site of unspecified female breast: Secondary | ICD-10-CM | POA: Diagnosis not present

## 2020-03-28 DIAGNOSIS — Z515 Encounter for palliative care: Secondary | ICD-10-CM

## 2020-03-28 NOTE — Progress Notes (Signed)
[facility] Consult Note Telephone: [phone]  Fax: [phone]  PATIENT NAME: Tiffany Bailey DOB: [birth year] MRN: [id number]  PRIMARY CARE PROVIDER:   Lauretta Grill, NP  REFERRING PROVIDER:  Lauretta Grill, NP [place],  [place] [address]  Due to the COVID-19 crisis, this visit was done via telemedicine from my office and it was initiated and consent by this patient and or family.  RESPONSIBLE PARTY:   Self RECOMMENDATIONS and PLAN: 1.DZH:GDJM code, ongoing family discussion including most form  2. Pain; continue current regimen with tylenol as needed which relieved her headache.   3.Palliative care encounter; Palliative medicine team will continue to support patient, patient's family, and medical team. Visit consisted of counseling and education dealing with the complex and emotionally intense issues of symptom management and palliative care in the setting of serious and potentially life-threatening illness  4. F/u in 4 weeks with ongoing treatment for cancer with oral chemotherapy, ongoing discussions of goc, code status,   I spent 50 minutes providing this consultation,  from 10:00am to 10:50am. More than 50% of the time in this consultation was spent coordinating communication.   HISTORY OF PRESENT ILLNESS:  Tiffany Bailey is a 86 y.o. year old female with multiple medical problems including Coronary artery disease s/p cabg with multiple pci's, myocardial infarction, carotid stenosis, hypertension, hyperlipidemia, history of breast s/p mastectomy cancer, chronic kidney disease, diabetes, arthritis, anemia, guard, adult failure to thrive, bilateral cataracts with phaco, bladder surgery, left above knee amputation, abdominal hysterectomy, abdominal tumor removed. I called Tiffany Bailey to confirm follow up in person palliative care visit. Tiffany Bailey endorses that she has now moved from her home to her son's home and request  the visit be changed to telemedicine telephonic as video not available. Change palliative care visit to telemedicine per [name] request. Tiffany Bailey and I talked about purpose of palliative care visit. Ms Bailey in agreement. We talked about how she has been feeling. Tiffany Bailey endorses that she has been doing okay. We talked about her chemotherapy treatment for what she continues to take orally. Tiffany Bailey endorses she seems to be doing okay with that no side effects. Ms Bailey endorses she believes she has not lost any weight. Ms Bailey talked about her appetite being declined. Tiffany Bailey talked about symptoms of pain but she does experience phantom pain. She does take Tylenol with relief. Tiffany Bailey is also talked about it intermittent headache that she gets for which Tylenol also relieves her pain. Currently she is not experiencing a headache. We talked about medical goals of care. Ms Bailey remains a full code and wishes to continue to be a full code with aggressive interventions. Tiffany Bailey talked about her cancer and shared that she asked if they can do surgery which she was told that that was not an option. 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 Ms Bailey talked about her frustration when she heard that something can not be done because somebody is older in age. We talked about the risks of anesthesia and elderly folks. We talked about scenarios of treatment. Tiffany Bailey  continues to wish to have full scope of treatment. We talked about role of palliative care and plan of care. Tiffany Bailey talked at length about her home and [facility] that is being redone and the people that have helped her to try to get that accomplished. Tiffany Bailey talked at  length about family dynamics. Therapeutic listening and emotional support provided. Contact information. We talked about follow-up palliative care visit in two months if needed or sooner should she declined. Tiffany Bailey in agreement.  appointment scheduled. Questions answered to satisfaction. Tiffany Bailey endorses that she wishes to update her son Tiffany Bailey on palliative care visit.  Palliative Care was asked to help to continue to address goals of care.   CODE STATUS: Full code PPS: 50% HOSPICE ELIGIBILITY/DIAGNOSIS: TBD  PAST MEDICAL HISTORY:  Past Medical History:  Diagnosis Date  . Anemia   . Anginal pain ([place])   . Arthritis   . Breast cancer ([place]) 07/19/2019  . Cancer (HCC)    BREAST  . CKD (chronic kidney disease), stage III ([place])   . Coronary artery disease    S/P CABG and multiple PCI's  . Diabetes mellitus without complication ([place])   . Edema   . Failure to thrive in adult   . GERD (gastroesophageal reflux disease)   . Hb-SS disease without crisis ([place]) 10/23/2017  . History of breast cancer   . HOH (hard of hearing)   . Hyperlipidemia   . Hypertension   . Myocardial infarction ([place])   . Palpitations     SOCIAL HX:  Social History   Tobacco Use  . Smoking status: Never Smoker  . Smokeless tobacco: Never Used  Substance Use Topics  . Alcohol use: No    ALLERGIES:  Allergies  Allergen Reactions  . Amoxicillin Hives and Nausea Only  . Gabapentin Other (See Comments)    AMS Confusion  . Mirtazapine Itching    Dry mouth with ODT Denies SOB with medication  . Other     UNKNOWN PAIN MEDICATION - unknown reaction     PERTINENT MEDICATIONS:  Outpatient Encounter Medications as of 03/28/2020  Medication Sig  . amLODipine (NORVASC) 10 MG tablet TAKE 1 TABLET BY MOUTH  DAILY  . anastrozole (ARIMIDEX) 1 MG tablet Take 1 tablet (1 mg total) by mouth daily.  Marland Kitchen aspirin EC 81 MG tablet Take 1 tablet (81 mg total) by mouth daily.  Marland Kitchen atorvastatin (LIPITOR) 40 MG tablet TAKE 1 TABLET BY MOUTH  DAILY  . blood glucose meter kit and supplies KIT Dispense based on patient and insurance preference. Use up to four times daily as directed. (FOR ICD-9 250.00, 250.01).  . Chromium-Cinnamon (CINNAMON PLUS  CHROMIUM) [phone] MCG-MG CAPS Take 2 capsules by mouth daily. (Patient not taking: Reported on 03/08/2020)  . clopidogrel (PLAVIX) 75 MG tablet TAKE 1 TABLET BY MOUTH  DAILY  . CORTISPORIN-TC 3.08-17-08-0.5 MG/ML OTIC suspension SMARTSIG:In Ear(s)  . ferrous sulfate 325 (65 FE) MG EC tablet Take 1 tablet (325 mg total) by mouth 2 (two) times daily with a meal. (Patient not taking: Reported on 03/08/2020)  . fluticasone (FLONASE) 50 MCG/ACT nasal spray Place 1 spray into both nostrils daily. (Patient not taking: Reported on 03/08/2020)  . glucose blood (ONE TOUCH TEST STRIPS) test strip Use to test blood sugar twice daily  . hydrALAZINE (APRESOLINE) 10 MG tablet Take 10 mg by mouth 2 (two) times daily.  Marland Kitchen ibuprofen (ADVIL) 100 MG/5ML suspension Take 200 mg by mouth every 8 (eight) hours as needed.  . Insulin Glargine (BASAGLAR KWIKPEN) 100 UNIT/ML SOPN Inject 0.06 mLs (6 Units total) into the skin daily. [place] endocrine as of  01/21/2019  . Insulin Pen Needle (BD PEN NEEDLE NANO U/F) 32G X 4 MM MISC USE 1 DAILY  . ipratropium (ATROVENT) 0.03 % nasal spray  Place 2 sprays into both nostrils every 12 (twelve) hours.  . megestrol (MEGACE) 40 MG/ML suspension   . metoprolol tartrate (LOPRESSOR) 25 MG tablet TAKE 1 TABLET BY MOUTH TWO  TIMES DAILY  . nitroGLYCERIN (NITROSTAT) 0.4 MG SL tablet Place 1 tablet (0.4 mg total) under the tongue every 5 (five) minutes as needed for chest pain.  . ONE TOUCH LANCETS MISC Use as directed 2 times per day  . vitamin B-12 (CYANOCOBALAMIN) 1000 MCG tablet Take 1,000 mcg by mouth daily. (Patient not taking: Reported on 03/08/2020)   No facility-administered encounter medications on file as of 03/28/2020.    PHYSICAL EXAM:  Deferred Kedron Uno Z Dwayne Bulkley, NP

## 2020-03-29 ENCOUNTER — Other Ambulatory Visit: Payer: Self-pay | Admitting: Cardiovascular Disease

## 2020-03-29 ENCOUNTER — Other Ambulatory Visit: Payer: Self-pay | Admitting: Oncology

## 2020-03-29 ENCOUNTER — Other Ambulatory Visit: Payer: Self-pay | Admitting: Family

## 2020-03-30 DIAGNOSIS — Z139 Encounter for screening, unspecified: Secondary | ICD-10-CM | POA: Diagnosis not present

## 2020-05-02 ENCOUNTER — Other Ambulatory Visit: Payer: Self-pay

## 2020-05-02 ENCOUNTER — Other Ambulatory Visit: Payer: Medicare Other | Admitting: Nurse Practitioner

## 2020-05-02 ENCOUNTER — Encounter: Payer: Self-pay | Admitting: Nurse Practitioner

## 2020-05-02 DIAGNOSIS — C50919 Malignant neoplasm of unspecified site of unspecified female breast: Secondary | ICD-10-CM

## 2020-05-02 DIAGNOSIS — Z515 Encounter for palliative care: Secondary | ICD-10-CM

## 2020-05-02 NOTE — Progress Notes (Signed)
[facility] Consult Note Telephone: [phone]  Fax: [phone]  PATIENT NAME: Tiffany Bailey DOB: 1927/06/22 MRN: [id number]  PRIMARY CARE PROVIDER:   Lauretta Grill, NP  REFERRING PROVIDER:  Lauretta Grill, NP [place],  [place] [address]   Due to the COVID-19 crisis, this visit was done via telemedicine from my office and it was initiated and consent by this patient and or family.  RESPONSIBLE PARTY:Self RECOMMENDATIONS and PLAN: 1.MHW:KGSU code, ongoing family discussionincluding most form  2. Pain; continue current regimen with tylenol as needed which relieved her headache.  3.Palliative care encounter; Palliative medicine team will continue to support patient, patient's family, and medical team. Visit consisted of counseling and education dealing with the complex and emotionally intense issues of symptom management and palliative care in the setting of serious and potentially life-threatening illness  4. F/u in 8 weeks with ongoing treatment for cancer with oral chemotherapy, ongoing discussions of goc, code status,  I spent 55 minutes providing this consultation,  from 11:45anom to 12:12:40pm. More than 50% of the time in this consultation was spent coordinating communication.   HISTORY OF PRESENT ILLNESS:  Tiffany Bailey is a 86 y.o. year old female with multiple medical problems including Breast cancer, Coronary artery disease s/p cabg with multiple pci's, myocardial infarction, carotid stenosis, hypertension, hyperlipidemia, history of breast s/p mastectomy cancer, chronic kidney disease, diabetes, arthritis, anemia, guard, adult failure to thrive. 03/08/2020 visit Dr [facility] for follow-up visit breast cancer currently on Anastrozole with partial response on CT scan 5 / 4 / 2021. Anemia of chronic kidney disease with hemoglobin above 10, no intervention. CT scan done 10/2018 one no sign of metastasis.  Follow up palliative care visit for Tiffany Bailey. I called Tiffany Bailey prior to visit and she is currently at her son's home and wishes to transition to telemedicine visit, telephone is video not available. We talked about purpose of palliative care visit to follow up symptoms of pain, appetite, sleep, chronic disease progression, currently remains on oral chemotherapy with Anastrozole. Tiffany Bailey in agreement. We talked about how she has been feeling today. Tiffany Bailey endorses that she still residing with her son Tiffany Bailey at his home with his wife. Tiffany. Bailey endorses "doing well there". ,Tiffany. Bailey endorses she was very grateful Tiffany Bailey and his wife allowed her to stay. Tiffany. Bailey talked about phantom pain and her stump that she does experience. Tiffany. Bailey endorses that Tylenol does seem to help though it is tender at times. Tiffany. [name] endorse she was in the process of trying to get her prosthetic refitted although she lost her leg somewhere in her home she was unable to locate it. Since Tiffany. Bailey has moved in with her son, Tiffany Bailey she has now found her leg. Tiffany. Bailey endorses she will schedule a follow-up appointment with Orthopedics for refitting. We talked about the pain that she has intermittently in her chest. We talked about symptom management and relief. We talked about her appetite has improved since she has moved in with [name].  Tiffany. Bailey endorses she has not lost weight nor had to change clothes sizes as of this time. We talked about sleep, sleep pattern and sleep hygiene. Tiffany Bailey endorses she currently on an antibiotic for sinus infection prescribed by her Primary care provider Tiffany Grill NP.  Tiffany Bailey endorses she feels the sinus infection is improving. We talked about her daily routine. We talked about medical goals of  care. Tiffany Bailey endorse his wishes to remain full scope of interventions including full code. We talked about role of palliative care and plan of care. Discuss follow-up  palliative care visit in 2 months if needed or sooner should she declined. Tiffany. Bailey in agreement, appointment scheduled. Discussed with Tiffany. Bailey will contact prior to appointment to see which address she is at prior to the visit. Therapeutic listening, emotional support provided. Contact information. Tiffany Bailey expressed that she was grateful for palliative care visit,  support and discussion. Questions answered to satisfaction. I called Tiffany Bailey, [name] son for update on Palliative care visit. We talked about visit with Tiffany Bailey. We talked about medical goals of care. We talked about code status. Marc Morgans was going to revisit code status with Tiffany Bailey and his siblings. Discuss follow-up visit. Questions answered.  Palliative Care was asked to help to continue to address goals of care.   CODE STATUS: Full code  PPS: 50% HOSPICE ELIGIBILITY/DIAGNOSIS: TBD  PAST MEDICAL HISTORY:  Past Medical History:  Diagnosis Date  . Anemia   . Anginal pain ([place])   . Arthritis   . Breast cancer ([place]) 07/19/2019  . Cancer (HCC)    BREAST  . CKD (chronic kidney disease), stage III ([place])   . Coronary artery disease    S/P CABG and multiple PCI's  . Diabetes mellitus without complication ([place])   . Edema   . Failure to thrive in adult   . GERD (gastroesophageal reflux disease)   . Hb-SS disease without crisis ([place]) 10/23/2017  . History of breast cancer   . HOH (hard of hearing)   . Hyperlipidemia   . Hypertension   . Myocardial infarction ([place])   . Palpitations     SOCIAL HX:  Social History   Tobacco Use  . Smoking status: Never Smoker  . Smokeless tobacco: Never Used  Substance Use Topics  . Alcohol use: No    ALLERGIES:  Allergies  Allergen Reactions  . Amoxicillin Hives and Nausea Only  . Gabapentin Other (See Comments)    AMS Confusion  . Mirtazapine Itching    Dry mouth with ODT Denies SOB with medication  . Other     UNKNOWN PAIN MEDICATION - unknown reaction       PHYSICAL EXAM:   Deferred  Tiffany Bailey Z Ka Flammer, NP

## 2020-05-02 NOTE — Addendum Note (Signed)
Addended by: Shawn Stall on: 05/02/2020 07:53 PM   Modules accepted: Level of Service

## 2020-05-23 ENCOUNTER — Other Ambulatory Visit: Payer: Self-pay | Admitting: Oncology

## 2020-05-24 ENCOUNTER — Other Ambulatory Visit: Payer: Self-pay | Admitting: Family

## 2020-05-24 DIAGNOSIS — E785 Hyperlipidemia, unspecified: Secondary | ICD-10-CM

## 2020-06-08 ENCOUNTER — Other Ambulatory Visit: Payer: Self-pay

## 2020-06-08 DIAGNOSIS — C50919 Malignant neoplasm of unspecified site of unspecified female breast: Secondary | ICD-10-CM

## 2020-06-12 ENCOUNTER — Inpatient Hospital Stay: Payer: Medicare Other | Admitting: Oncology

## 2020-06-12 ENCOUNTER — Inpatient Hospital Stay: Payer: Medicare Other

## 2020-06-13 ENCOUNTER — Inpatient Hospital Stay (HOSPITAL_BASED_OUTPATIENT_CLINIC_OR_DEPARTMENT_OTHER): Payer: Medicare Other | Admitting: Oncology

## 2020-06-13 ENCOUNTER — Inpatient Hospital Stay: Payer: Medicare Other | Attending: Oncology

## 2020-06-13 ENCOUNTER — Other Ambulatory Visit: Payer: Self-pay

## 2020-06-13 ENCOUNTER — Encounter: Payer: Self-pay | Admitting: Oncology

## 2020-06-13 VITALS — BP 145/79 | HR 71 | Temp 98.8°F

## 2020-06-13 DIAGNOSIS — Z79811 Long term (current) use of aromatase inhibitors: Secondary | ICD-10-CM | POA: Insufficient documentation

## 2020-06-13 DIAGNOSIS — N1831 Chronic kidney disease, stage 3a: Secondary | ICD-10-CM | POA: Insufficient documentation

## 2020-06-13 DIAGNOSIS — M8580 Other specified disorders of bone density and structure, unspecified site: Secondary | ICD-10-CM | POA: Diagnosis not present

## 2020-06-13 DIAGNOSIS — Z9010 Acquired absence of unspecified breast and nipple: Secondary | ICD-10-CM | POA: Insufficient documentation

## 2020-06-13 DIAGNOSIS — Z90710 Acquired absence of both cervix and uterus: Secondary | ICD-10-CM | POA: Diagnosis not present

## 2020-06-13 DIAGNOSIS — E1165 Type 2 diabetes mellitus with hyperglycemia: Secondary | ICD-10-CM | POA: Insufficient documentation

## 2020-06-13 DIAGNOSIS — D571 Sickle-cell disease without crisis: Secondary | ICD-10-CM | POA: Diagnosis not present

## 2020-06-13 DIAGNOSIS — D631 Anemia in chronic kidney disease: Secondary | ICD-10-CM

## 2020-06-13 DIAGNOSIS — Z89612 Acquired absence of left leg above knee: Secondary | ICD-10-CM | POA: Insufficient documentation

## 2020-06-13 DIAGNOSIS — Z853 Personal history of malignant neoplasm of breast: Secondary | ICD-10-CM | POA: Insufficient documentation

## 2020-06-13 DIAGNOSIS — C50919 Malignant neoplasm of unspecified site of unspecified female breast: Secondary | ICD-10-CM

## 2020-06-13 DIAGNOSIS — Z803 Family history of malignant neoplasm of breast: Secondary | ICD-10-CM | POA: Insufficient documentation

## 2020-06-13 DIAGNOSIS — E1122 Type 2 diabetes mellitus with diabetic chronic kidney disease: Secondary | ICD-10-CM | POA: Insufficient documentation

## 2020-06-13 DIAGNOSIS — I252 Old myocardial infarction: Secondary | ICD-10-CM | POA: Diagnosis not present

## 2020-06-13 DIAGNOSIS — I129 Hypertensive chronic kidney disease with stage 1 through stage 4 chronic kidney disease, or unspecified chronic kidney disease: Secondary | ICD-10-CM | POA: Insufficient documentation

## 2020-06-13 LAB — COMPREHENSIVE METABOLIC PANEL
ALT: 22 U/L (ref 0–44)
AST: 26 U/L (ref 15–41)
Albumin: 3.2 g/dL — ABNORMAL LOW (ref 3.5–5.0)
Alkaline Phosphatase: 107 U/L (ref 38–126)
Anion gap: 11 (ref 5–15)
BUN: 27 mg/dL — ABNORMAL HIGH (ref 8–23)
CO2: 17 mmol/L — ABNORMAL LOW (ref 22–32)
Calcium: 8.3 mg/dL — ABNORMAL LOW (ref 8.9–10.3)
Chloride: 107 mmol/L (ref 98–111)
Creatinine, Ser: 1.47 mg/dL — ABNORMAL HIGH (ref 0.44–1.00)
GFR, Estimated: 34 mL/min — ABNORMAL LOW (ref >60)
Glucose, Bld: 328 mg/dL — ABNORMAL HIGH (ref 70–99)
Potassium: 4.1 mmol/L (ref 3.5–5.1)
Sodium: 135 mmol/L (ref 135–145)
Total Bilirubin: 0.6 mg/dL (ref 0.3–1.2)
Total Protein: 6.1 g/dL — ABNORMAL LOW (ref 6.5–8.1)

## 2020-06-13 LAB — FERRITIN: Ferritin: 77 ng/mL (ref 11–307)

## 2020-06-13 LAB — CBC WITH DIFFERENTIAL/PLATELET
Abs Immature Granulocytes: 0.01 10*3/uL (ref 0.00–0.07)
Basophils Absolute: 0 10*3/uL (ref 0.0–0.1)
Basophils Relative: 1 %
Eosinophils Absolute: 0.2 10*3/uL (ref 0.0–0.5)
Eosinophils Relative: 4 %
HCT: 29.1 % — ABNORMAL LOW (ref 36.0–46.0)
Hemoglobin: 9.8 g/dL — ABNORMAL LOW (ref 12.0–15.0)
Immature Granulocytes: 0 %
Lymphocytes Relative: 21 %
Lymphs Abs: 1 10*3/uL (ref 0.7–4.0)
MCH: 30.3 pg (ref 26.0–34.0)
MCHC: 33.7 g/dL (ref 30.0–36.0)
MCV: 90.1 fL (ref 80.0–100.0)
Monocytes Absolute: 0.4 10*3/uL (ref 0.1–1.0)
Monocytes Relative: 8 %
Neutro Abs: 3.2 10*3/uL (ref 1.7–7.7)
Neutrophils Relative %: 66 %
Platelets: 251 10*3/uL (ref 150–400)
RBC: 3.23 MIL/uL — ABNORMAL LOW (ref 3.87–5.11)
RDW: 13.7 % (ref 11.5–15.5)
WBC: 4.8 10*3/uL (ref 4.0–10.5)
nRBC: 0 % (ref 0.0–0.2)

## 2020-06-13 LAB — IRON AND TIBC
Iron: 60 ug/dL (ref 28–170)
Saturation Ratios: 24 % (ref 10.4–31.8)
TIBC: 249 ug/dL — ABNORMAL LOW (ref 250–450)
UIBC: 189 ug/dL

## 2020-06-13 NOTE — Progress Notes (Signed)
Hematology/Oncology  Follow up note [facility] Telephone:([phone] Fax:([phone]   Patient Care Team: Lauretta Grill, NP as PCP - General (Nurse Practitioner) Minna Merritts, MD as PCP - Cardiology (Cardiology) Lauretta Grill, NP as Nurse Practitioner (Nurse Practitioner)  REFERRING PROVIDER: Lauretta Grill, NP  CHIEF COMPLAINTS/REASON FOR VISIT:  Follow up for breast cancer  HISTORY OF PRESENTING ILLNESS:   Tiffany Bailey is a  86 y.o.  female with PMH listed below was seen in consultation at the request of  Lauretta Grill, NP  for evaluation of abnormal CT scan. Patient was recently evaluated by primary care provider for complaints of urinary retention, headache, constipation. 02/03/2019 x-ray of abdomen showed large colonic stool volume, nodular densities noted in the right mid lung are up). 02/08/2019 subsequent chest x-ray two-view showed hyperinflation of the lungs compatible with COPD.  Multiple nodular densities project over the mid and lower right lung. CT chest was obtained for further evaluation. 02/17/2019 CT chest without contrast Showed multiple bilateral pulmonary nodules,groundglass opacities. Largest solid nodule at the confluence of the right major and minor fissures measuring 1.6 cm.  Most notable groundglass nodule is of the right upper lobe and measure 1.8 x 0.8 cm, small right pleural effusion with associated atelectasis or consolidation.  Larger solid nodule are highly suspicious for metastatic disease. Bulky right mediastinal and hilar lymph node.  Largest pretracheal nodes measuring 3.5 x 3 cm.  There are additional bulky superior mediastinal, supraclavicular, left subpectoral lymph nodes.  Largest subpectoral nodes measuring 3.3 x 2.5 cm.  Patient was sent to cancer center for further evaluation.  #Patient reports a history of left breast cancer status post mastectomy.  She is a poor historian.  She is not able to recall the  timeframe of breast cancer diagnosis and then treatments.  Denies any chemotherapy or radiation treatments.  #Left lower extremity history of amputation, CKD, History of MI, Sickle cell disease, DM, HTN, anemia.   Patient denies shortness of breath, chest pain, hemoptysis.  Night sweating, abdominal pain. Reports intentional weight loss.  Appetite is poor.  Not eating much. Also have wax and wane, chronic intermittent headache, she takes Tylenol as needed, with some relief.  Denies any nausea vomiting. She sees neurology. 11/10/2018 CT head showed no acute intracranial abnormality or significant interval changes.  Stable atrophy and white matter disease.  Atherosclerosis.  #Patient had a consultation visit with me on 02/19/2019.  At that time he was referred for abnormal CT scan.  PET scan was obtained and I recommend patient to obtain left axillary lymph node biopsy.  Patient/family called back and decided not to proceed with any additional work-up.  #February 2020 started Arimidex INTERVAL HISTORY NOTNAMED CROUCHER is a 86 y.o. female who has above history reviewed by me today presents for follow up visit for management of metastatic breast cancer. Problems and complaints are listed below: Patient was accompanied by her daughter.  Patient reports feeling well. No new complaints.  Review of Systems  Constitutional: Positive for fatigue. Negative for appetite change, chills, fever and unexpected weight change.  HENT:   Negative for hearing loss and voice change.   Eyes: Negative for eye problems.  Respiratory: Negative for chest tightness, cough and shortness of breath.   Cardiovascular: Negative for chest pain.  Gastrointestinal: Negative for abdominal distention, abdominal pain, blood in stool and constipation.  Endocrine: Negative for hot flashes.  Genitourinary: Negative for difficulty urinating and frequency.   Musculoskeletal: Negative for arthralgias.  Left lower extremity history of  amputation, intermittent sharp pain  Skin: Negative for itching and rash.  Neurological: Negative for extremity weakness and headaches.  Hematological: Negative for adenopathy.  Psychiatric/Behavioral: Negative for confusion.    MEDICAL HISTORY:  Past Medical History:  Diagnosis Date  . Anemia   . Anginal pain ([place])   . Arthritis   . Breast cancer ([place]) 07/19/2019  . Cancer (HCC)    BREAST  . CKD (chronic kidney disease), stage III ([place])   . Coronary artery disease    S/P CABG and multiple PCI's  . Diabetes mellitus without complication ([place])   . Edema   . Failure to thrive in adult   . GERD (gastroesophageal reflux disease)   . Hb-SS disease without crisis ([place]) 10/23/2017  . History of breast cancer   . HOH (hard of hearing)   . Hyperlipidemia   . Hypertension   . Myocardial infarction ([place])   . Palpitations     SURGICAL HISTORY: Past Surgical History:  Procedure Laterality Date  . ABDOMINAL HYSTERECTOMY    . ABOVE KNEE LEG AMPUTATION Left 2013  . ANTERIOR VITRECTOMY Left 11/16/2015   Procedure: ANTERIOR VITRECTOMY;  Surgeon: Eulogio Bear, MD;  Location: [facility] ORS;  Service: Ophthalmology;  Laterality: Left;  . BLADDER SURGERY    . CATARACT EXTRACTION W/PHACO Left 11/16/2015   Procedure: CATARACT EXTRACTION PHACO AND INTRAOCULAR LENS PLACEMENT (IOC);  Surgeon: Eulogio Bear, MD;  Location: [facility] ORS;  Service: Ophthalmology;  Laterality: Left;  Lot # [id number] H [place]; 01:22.8 AP%:12.6 CDE: 10.46  . CATARACT EXTRACTION W/PHACO Right 08/22/2016   Procedure: CATARACT EXTRACTION PHACO AND INTRAOCULAR LENS PLACEMENT (IOC);  Surgeon: Eulogio Bear, MD;  Location: [facility] ORS;  Service: Ophthalmology;  Laterality: Right;  Lot # [id number] H [place]: 00:52.1 AP%:8.7 CDE: 4.99   . CORONARY ANGIOPLASTY     STENT  . CORONARY ARTERY BYPASS GRAFT    . LOWER EXTREMITY ANGIOGRAPHY Right 01/14/2019   Procedure: LOWER EXTREMITY ANGIOGRAPHY;  Surgeon: Algernon Huxley, MD;  Location: [facility];  Service: Cardiovascular;  Laterality: Right;  . MASTECTOMY    . TUMOR REMOVAL     ABDOMINAL    SOCIAL HISTORY: Social History   Socioeconomic History  . Marital status: Divorced    Spouse name: Not on file  . Number of children: 7  . Years of education: Not on file  . Highest education level: Not on file  Occupational History  . 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 Smoking status: Never Smoker  . Smokeless tobacco: Never Used  Substance and Sexual Activity  . Alcohol use: No  . Drug use: No  . Sexual activity: Not Currently  Other Topics Concern  . Not on file  Social History Narrative  . Not on file   Social Determinants of Health   Financial Resource Strain: Not on file  Food Insecurity: Not on file  Transportation Needs: Not on file  Physical Activity: Not on file  Stress: Not on file  Social Connections: Not on file  Intimate Partner Violence: Not on file    FAMILY HISTORY: Family History  Problem Relation Age of Onset  . Sudden death Mother   . Arthritis Father   . Stroke Father   . Breast cancer Sister   . Diabetes Grandchild     ALLERGIES:  is allergic to amoxicillin, gabapentin, mirtazapine, and other.  MEDICATIONS:  Current Outpatient Medications  Medication Sig  Dispense Refill  . amLODipine (NORVASC) 10 MG tablet TAKE 1 TABLET BY MOUTH  DAILY 90 tablet 0  . anastrozole (ARIMIDEX) 1 MG tablet TAKE 1 TABLET BY MOUTH  DAILY 90 tablet 3  . aspirin EC 81 MG tablet Take 1 tablet (81 mg total) by mouth daily. 30 tablet 5  . atorvastatin (LIPITOR) 40 MG tablet TAKE 1 TABLET BY MOUTH  DAILY 90 tablet 3  . blood glucose meter kit and supplies KIT Dispense based on patient and insurance preference. Use up to four times daily as directed. (FOR ICD-9 250.00, 250.01). 1 each 0  . clopidogrel (PLAVIX) 75 MG tablet TAKE 1 TABLET BY MOUTH  DAILY 90 tablet 0  . CORTISPORIN-TC 3.08-17-08-0.5 MG/ML OTIC suspension SMARTSIG:In Ear(s)     . glucose blood (ONE TOUCH TEST STRIPS) test strip Use to test blood sugar twice daily 200 each 3  . hydrALAZINE (APRESOLINE) 10 MG tablet Take 10 mg by mouth 2 (two) times daily.    Marland Kitchen ibuprofen (ADVIL) 100 MG/5ML suspension Take 200 mg by mouth every 8 (eight) hours as needed.    . insulin degludec (TRESIBA FLEXTOUCH) 200 UNIT/ML FlexTouch Pen Inject into the skin.    . Insulin Pen Needle (BD PEN NEEDLE NANO U/F) 32G X 4 MM MISC USE 1 DAILY 100 each 12  . ipratropium (ATROVENT) 0.03 % nasal spray Place 2 sprays into both nostrils every 12 (twelve) hours. 30 mL 12  . megestrol (MEGACE) 40 MG/ML suspension     . metoprolol tartrate (LOPRESSOR) 25 MG tablet TAKE 1 TABLET BY MOUTH  TWICE DAILY 180 tablet 3  . nitroGLYCERIN (NITROSTAT) 0.4 MG SL tablet Place 1 tablet (0.4 mg total) under the tongue every 5 (five) minutes as needed for chest pain. 25 tablet 4  . ONE TOUCH LANCETS MISC Use as directed 2 times per day 200 each 3  . vitamin B-12 (CYANOCOBALAMIN) 1000 MCG tablet Take 1,000 mcg by mouth daily.    . Chromium-Cinnamon (CINNAMON PLUS CHROMIUM) 3800974320 MCG-MG CAPS Take 2 capsules by mouth daily. (Patient not taking: No sig reported)    . ferrous sulfate 325 (65 FE) MG EC tablet Take 1 tablet (325 mg total) by mouth 2 (two) times daily with a meal. (Patient not taking: No sig reported) 60 tablet 1  . fluticasone (FLONASE) 50 MCG/ACT nasal spray Place 1 spray into both nostrils daily. (Patient not taking: No sig reported) 16 g 5   No current facility-administered medications for this visit.     PHYSICAL EXAMINATION: ECOG PERFORMANCE STATUS: 2 - Symptomatic, <50% confined to bed Vitals:   06/13/20 1313  BP: (!) 145/79  Pulse: 71  Temp: 98.8 F (37.1 C)   There were no vitals filed for this visit.  Physical Exam Constitutional:      General: She is not in acute distress.    Comments: She sits in the wheel chair.  Frail appearance  HENT:     Head: Normocephalic and atraumatic.   Eyes:     General: No scleral icterus.    Pupils: Pupils are equal, round, and reactive to light.  Cardiovascular:     Rate and Rhythm: Normal rate and regular rhythm.     Heart sounds: Normal heart sounds.  Pulmonary:     Effort: Pulmonary effort is normal. No respiratory distress.     Breath sounds: No wheezing.     Comments: Decreased breath sound right lower lobe.  Good air entry on the left base.  Abdominal:  General: Bowel sounds are normal. There is no distension.     Palpations: Abdomen is soft.  Musculoskeletal:        General: No deformity. Normal range of motion.     Cervical back: Normal range of motion and neck supple.     Comments: Left lower extremity history of amputation.   Skin:    General: Skin is warm and dry.     Findings: No erythema or rash.  Neurological:     Mental Status: She is alert and oriented to person, place, and time. Mental status is at baseline.     Cranial Nerves: No cranial nerve deficit.     Coordination: Coordination normal.  Psychiatric:        Mood and Affect: Mood normal.      LABORATORY DATA:  I have reviewed the data as listed Lab Results  Component Value Date   WBC 4.8 06/13/2020   HGB 9.8 (L) 06/13/2020   HCT 29.1 (L) 06/13/2020   MCV 90.1 06/13/2020   PLT 251 06/13/2020   Recent Labs    10/25/19 1247 12/08/19 1252 03/08/20 1255 06/13/20 1255  NA 134* 135 137 135  K 3.6 3.9 4.1 4.1  CL 106 104 108 107  CO2 20* 21* 20* 17*  GLUCOSE 203* 301* 223* 328*  BUN 31* 27* 35* 27*  CREATININE 1.22* 1.17* 1.41* 1.47*  CALCIUM 8.7* 8.6* 9.0 8.3*  GFRNONAA 39* 41* 33* 34*  GFRAA 45* 48* 38*  --   PROT 6.5 7.0 7.1 6.1*  ALBUMIN 3.4* 3.6 3.7 3.2*  AST _0 ALT 33 _1 ALKPHOS 81 102 122 107  BILITOT 0.7 0.5 0.5 0.6   Iron/TIBC/Ferritin/ %Sat    Component Value Date/Time   IRON 60 06/13/2020 1255   TIBC 249 (L) 06/13/2020 1255   FERRITIN 77 06/13/2020 1255   IRONPCTSAT 24 06/13/2020 1255    IRONPCTSAT 27 06/19/2018 1006      RADIOGRAPHIC STUDIES: I have personally reviewed the radiological images as listed and agreed with the findings in the report. CT Chest Wo Contrast  Result Date: 03/21/2020 CLINICAL DATA:  Metastatic left breast cancer with history left mastectomy. Restaging. EXAM: CT CHEST WITHOUT CONTRAST TECHNIQUE: Multidetector CT imaging of the chest was performed following the standard protocol without IV contrast. COMPARISON:  10/19/2019 chest CT. FINDINGS: Cardiovascular: Normal heart size. No significant pericardial effusion/thickening. Three-vessel coronary atherosclerosis status post CABG. Atherosclerotic nonaneurysmal thoracic aorta. Normal caliber pulmonary arteries. Mediastinum/Nodes: No discrete thyroid nodules. Unremarkable esophagus. No axillary adenopathy. High right posterior paratracheal 1.1 cm node at thoracic inlet (series 2/image 19), previously 1.1 cm, stable. Enlarged 1.5 cm lower right paratracheal node (series 2/image 60), previously 2.1 cm, decreased. Enlarged 1.9 cm heterogeneous subcarinal node (series 2/image 73), previously 2.2 cm, slightly decreased. No new pathologically enlarged mediastinal nodes. No discrete hilar adenopathy on these noncontrast images. Lungs/Pleura: No pneumothorax. Small dependent right pleural effusion, decreased. No left pleural effusion. Improved aeration at the right lung base. No acute consolidative airspace disease. Several scattered solid pulmonary nodules in both lungs, decreased. Representative 1.6 x 0.4 cm nodule along the right major fissure (series 3/image 77), decreased from 2.2 x 0.9 cm. Representative 0.5 cm nodule along the left major fissure (series 3/image 66), decreased from 1.0 cm. Representative 0.6 cm right middle lobe nodule (series 3/image 106), decreased from 0.8 cm. A few of the solid right lower lobe pulmonary nodules measuring up to 0.9 cm (series 3/image 114) were  obscured by atelectasis on the prior scan  and cannot be compared. Several ground-glass pulmonary nodules scattered in both lungs, unchanged, largest 1.9 cm in the right upper lobe (series 3/image 69) and 1.4 cm in the left upper lobe (series 3/image 76). No new significant pulmonary nodules. Upper abdomen: Simple upper right renal cysts, largest 5.3 cm. Coarse calcifications scattered in the visualized renal sinuses bilaterally, favor vascular calcifications. Musculoskeletal: No aggressive appearing focal osseous lesions. Left mastectomy. Intact sternotomy wires. Mild thoracic spondylosis. IMPRESSION: 1. Continued interval positive response to therapy. Bilateral solid pulmonary metastases are decreased. Mediastinal lymphadenopathy is stable to decreased. Small dependent right pleural effusion, decreased. No new or progressive metastatic disease in the chest. 2. Several ground-glass pulmonary nodules scattered in both lungs are stable. 3. Aortic Atherosclerosis (ICD10-I70.0). Electronically Signed   By: Ilona Sorrel M.D.   On: 03/21/2020 16:43      ASSESSMENT & PLAN:  1. Metastatic breast cancer ([place])   2. Use of anastrozole (Arimidex)   3. Anemia due to stage 3a chronic kidney disease ([place])   4. Osteopenia, unspecified location    #Metastatic breast cancer, 10 /5 /2021 CT images were reviewed with patient and daughter.  Continued to have positive treatment response. Labs are reviewed and discussed with patient. Continue Arimidex 1 mg daily.  Given her advanced age, performance status other medical problems, I will hold off starting her on CDK inhibitors at this point  Osteopenia, bone density scan results were discussed with patient and daughter.  She has baseline osteopenia. Recommend patient to take calcium and vitamin D supplementation. Discussed about bisphosphonate and recommend patient to obtain dental clearance  # uncontrolled diabetes.  Recommend patient to communicate with primary care provider.  Recommend diabetic diet. #  Anemia of CKD, hemoglobin is 9.8.,  Check iron panel-which is not consistent with iron deficiency. Monitor counts.   Orders Placed This Encounter  Procedures  . Ferritin    Standing Status:   Future    Number of Occurrences:   1    Standing Expiration Date:   06/13/2021  . Iron and TIBC    Standing Status:   Future    Number of Occurrences:   1    Standing Expiration Date:   06/13/2021  . CBC with Differential/Platelet    Standing Status:   Future    Standing Expiration Date:   06/13/2021  . Comprehensive metabolic panel    Standing Status:   Future    Standing Expiration Date:   06/13/2021    All questions were answered. The patient knows to call the clinic with any problems questions or concerns. Return of visit:  3 months.   [name], MD, PhD Hematology Oncology [facility] Pager- [phone] 06/13/2020

## 2020-06-13 NOTE — Progress Notes (Signed)
Patient denies new problems/concerns today.   °

## 2020-06-19 ENCOUNTER — Other Ambulatory Visit: Payer: Medicare Other | Admitting: Nurse Practitioner

## 2020-06-19 ENCOUNTER — Encounter: Payer: Self-pay | Admitting: Nurse Practitioner

## 2020-06-19 ENCOUNTER — Other Ambulatory Visit: Payer: Self-pay

## 2020-06-19 DIAGNOSIS — C50919 Malignant neoplasm of unspecified site of unspecified female breast: Secondary | ICD-10-CM | POA: Diagnosis not present

## 2020-06-19 DIAGNOSIS — Z515 Encounter for palliative care: Secondary | ICD-10-CM

## 2020-06-19 NOTE — Progress Notes (Addendum)
[facility] Consult Note Telephone: [phone]  Fax: [phone]  PATIENT NAME: Tiffany Bailey DOB: 01-19-1928 MRN: [id number]  PRIMARY CARE PROVIDER:   Lauretta Grill, Bailey  REFERRING PROVIDER:  Lauretta Grill, Bailey [place],  [place] [address] Due to the COVID-19 crisis, this visit was done via telemedicine from my office and it was initiated and consent by this patient and or family.  RESPONSIBLE PARTY:Self RECOMMENDATIONS and PLAN: 1.WSF:KCLE code, aggressive interventions per Tiffany. Bailey request. Family continues to have ongoing discussions surrounding goc  2. Pain; continue current regimen with tylenol as needed which relieved her headache.  3.Palliative care encounter; Palliative medicine team will continue to support patient, patient's family, and medical team. Visit consisted of counseling and education dealing with the complex and emotionally intense issues of symptom management and palliative care in the setting of serious and potentially life-threatening illness  4. F/u in 4 weeks with ongoing treatment for cancer with oral chemotherapy, ongoing discussions of goc, code status,  I spent 50 minutes providing this consultation,  from 1:00pm to 1:50pm. More than 50% of the time in this consultation was spent coordinating communication.   HISTORY OF PRESENT ILLNESS:  Tiffany Bailey is a 87 y.o. year old female with multiple medical problems including Breast cancer, Coronary artery diseases/pcabg with multiple pci's, myocardial infarction, carotid stenosis, hypertension, hyperlipidemia, history of breast s/p mastectomy cancer, chronic kidney disease, diabetes, arthritis, anemia, guard, adult failure to thrive. Palliative care follow-up visit in person visit change to telemedicine telephonic is video not available per Tiffany. Bailey request. Tiffany Bailey and I talked about purpose of palliative care visit. Tiffany. Bailey in  agreement. We talked about how Tiffany Bailey has been feeling today. Tiffany Bailey endorses she has a sinus infection and that her legs have been giving her more discomfort with achiness. Discussed will contact Tiffany Bailey after Palliative care visit for update and follow up with sinus infection with Tiffany. Bailey. We talked about symptoms of pain at length with relief alternative modalities. We talked about recent changes as Tiffany Bailey has recently moved back to her home. Tiffany. Bailey currently living on her own independently. Tiffany. Bailey endorses her son of Tiffany Bailey may come live with her. Tiffany Bailey has been requiring assistance with ADLs and her children have been bringing her meals. Tiffany. Bailey endorses that she has done a little bit of preparing her meals at times. We talked about Tiffany. Bailey appetite which has been declined. We talked about diabetes uncontrolled and food choices. We talked about independence. We talked about her appointment with [facility]. We talked about the cancer, oral chemotherapy, with positive results. We talked about quality-of-life. We talked about medical goals of care. Tiffany Bailey endorses her wishes are to remain a full code. We talked about role of Palliative care and plan of care. Discussed with Tiffany Bailey moving back to Tiffany Bailey she is in another Palliative providers region and will have Tiffany Bailey schedule follow-up Palliative care visit or Center should Tiffany Bailey to climb. Tiffany Bailey in agreement. Therapeutic listening, emotional support provided. Contact information. Questions answered to satisfaction.  Palliative Care was asked to help to continue to address goals of care.   CODE STATUS: Full code  PPS: 50% HOSPICE ELIGIBILITY/DIAGNOSIS: TBD  PAST MEDICAL HISTORY:  Past Medical History:  Diagnosis Date  . Anemia   . Anginal pain ([place])   . Arthritis   . Breast cancer ([place]) 07/19/2019  .  Cancer (HCC)    BREAST  . CKD (chronic kidney disease), stage III ([place])   . Coronary  artery disease    S/P CABG and multiple PCI's  . Diabetes mellitus without complication ([place])   . Edema   . Failure to thrive in adult   . GERD (gastroesophageal reflux disease)   . Hb-SS disease without crisis ([place]) 10/23/2017  . History of breast cancer   . HOH (hard of hearing)   . Hyperlipidemia   . Hypertension   . Myocardial infarction ([place])   . Palpitations     SOCIAL HX:  Social History   Tobacco Use  . Smoking status: Never Smoker  . Smokeless tobacco: Never Used  Substance Use Topics  . Alcohol use: No    ALLERGIES:  Allergies  Allergen Reactions  . Amoxicillin Hives and Nausea Only  . Gabapentin Other (See Comments)    AMS Confusion  . Mirtazapine Itching    Dry mouth with ODT Denies SOB with medication  . Other     UNKNOWN PAIN MEDICATION - unknown reaction     PERTINENT MEDICATIONS:  Outpatient Encounter Medications as of 06/19/2020  Medication Sig  . amLODipine (NORVASC) 10 MG tablet TAKE 1 TABLET BY MOUTH  DAILY  . anastrozole (ARIMIDEX) 1 MG tablet TAKE 1 TABLET BY MOUTH  DAILY  . aspirin EC 81 MG tablet Take 1 tablet (81 mg total) by mouth daily.  Marland Kitchen atorvastatin (LIPITOR) 40 MG tablet TAKE 1 TABLET BY MOUTH  DAILY  . blood glucose meter kit and supplies KIT Dispense based on patient and insurance preference. Use up to four times daily as directed. (FOR ICD-9 250.00, 250.01).  . Chromium-Cinnamon (CINNAMON PLUS CHROMIUM) [phone] MCG-MG CAPS Take 2 capsules by mouth daily. (Patient not taking: No sig reported)  . clopidogrel (PLAVIX) 75 MG tablet TAKE 1 TABLET BY MOUTH  DAILY  . CORTISPORIN-TC 3.08-17-08-0.5 MG/ML OTIC suspension SMARTSIG:In Ear(s)  . ferrous sulfate 325 (65 FE) MG EC tablet Take 1 tablet (325 mg total) by mouth 2 (two) times daily with a meal. (Patient not taking: No sig reported)  . fluticasone (FLONASE) 50 MCG/ACT nasal spray Place 1 spray into both nostrils daily. (Patient not taking: No sig reported)  . glucose blood (ONE TOUCH TEST  STRIPS) test strip Use to test blood sugar twice daily  . hydrALAZINE (APRESOLINE) 10 MG tablet Take 10 mg by mouth 2 (two) times daily.  Marland Kitchen ibuprofen (ADVIL) 100 MG/5ML suspension Take 200 mg by mouth every 8 (eight) hours as needed.  . insulin degludec (TRESIBA FLEXTOUCH) 200 UNIT/ML FlexTouch Pen Inject into the skin.  . Insulin Pen Needle (BD PEN NEEDLE NANO U/F) 32G X 4 MM MISC USE 1 DAILY  . ipratropium (ATROVENT) 0.03 % nasal spray Place 2 sprays into both nostrils every 12 (twelve) hours.  . megestrol (MEGACE) 40 MG/ML suspension   . metoprolol tartrate (LOPRESSOR) 25 MG tablet TAKE 1 TABLET BY MOUTH  TWICE DAILY  . nitroGLYCERIN (NITROSTAT) 0.4 MG SL tablet Place 1 tablet (0.4 mg total) under the tongue every 5 (five) minutes as needed for chest pain.  . ONE TOUCH LANCETS MISC Use as directed 2 times per day  . vitamin B-12 (CYANOCOBALAMIN) 1000 MCG tablet Take 1,000 mcg by mouth daily.   No facility-administered encounter medications on file as of 06/19/2020.    PHYSICAL EXAM:  deferred Wynetta Seith Ihor Gully, Bailey

## 2020-06-29 ENCOUNTER — Other Ambulatory Visit: Payer: Self-pay | Admitting: Cardiovascular Disease

## 2020-07-04 DIAGNOSIS — Z139 Encounter for screening, unspecified: Secondary | ICD-10-CM | POA: Diagnosis not present

## 2020-07-05 DIAGNOSIS — Z20822 Contact with and (suspected) exposure to covid-19: Secondary | ICD-10-CM | POA: Diagnosis not present

## 2020-07-05 DIAGNOSIS — U071 COVID-19: Secondary | ICD-10-CM | POA: Diagnosis not present

## 2020-07-18 DIAGNOSIS — Z03818 Encounter for observation for suspected exposure to other biological agents ruled out: Secondary | ICD-10-CM | POA: Diagnosis not present

## 2020-07-19 ENCOUNTER — Ambulatory Visit: Payer: Medicare Other | Admitting: Cardiovascular Disease
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
## 2020-07-20 ENCOUNTER — Encounter: Payer: Self-pay | Admitting: Cardiovascular Disease

## 2020-07-25 ENCOUNTER — Other Ambulatory Visit: Payer: Medicare Other | Admitting: Primary Care

## 2020-07-25 ENCOUNTER — Other Ambulatory Visit: Payer: Self-pay

## 2020-08-01 ENCOUNTER — Other Ambulatory Visit: Payer: Self-pay

## 2020-08-01 ENCOUNTER — Other Ambulatory Visit: Payer: Medicare Other | Admitting: Primary Care

## 2020-08-01 DIAGNOSIS — Z515 Encounter for palliative care: Secondary | ICD-10-CM | POA: Diagnosis not present

## 2020-08-01 DIAGNOSIS — D631 Anemia in chronic kidney disease: Secondary | ICD-10-CM

## 2020-08-01 DIAGNOSIS — Z8679 Personal history of other diseases of the circulatory system: Secondary | ICD-10-CM | POA: Diagnosis not present

## 2020-08-01 DIAGNOSIS — Z89612 Acquired absence of left leg above knee: Secondary | ICD-10-CM

## 2020-08-01 DIAGNOSIS — N1831 Chronic kidney disease, stage 3a: Secondary | ICD-10-CM | POA: Diagnosis not present

## 2020-08-01 DIAGNOSIS — C50919 Malignant neoplasm of unspecified site of unspecified female breast: Secondary | ICD-10-CM | POA: Diagnosis not present

## 2020-08-01 NOTE — Progress Notes (Addendum)
[facility] Consult Note Telephone: [phone]  Fax: [phone]    Date of encounter: 08/01/20 PATIENT NAME: Tiffany Bailey [place] [id number] (home)  DOB: 1928-01-14 MRN: [id number]  PRIMARY CARE PROVIDER:    Lauretta Grill, NP,  [place] [address] [phone]  REFERRING PROVIDER:   Lauretta Grill, NP [address] [place],  [place] [address] [phone]  RESPONSIBLE PARTY:   Extended Emergency Contact Information Primary Emergency Contact: Ratliff,[name] F Address: [address]          [place], [place] [address] Johnnette Litter of [place] Phone: [phone] Work Phone: [phone] Mobile Phone: [phone] Relation: Daughter Secondary Emergency Contact: Ebert,lonnie Mobile Phone: [phone] Relation: Son  I met face to face with patient and family in  Home.  Palliative Care was asked to follow this patient by consultation request of Lauretta Grill, NP to help address advance care planning and goals of care. This is a follow up  visit.  ASSESSMENT AND RECOMMENDATIONS:   1. Advance Care Planning/Goals of Care: Goals include to maximize quality of life and symptom management. Our advance care planning conversation included a discussion about:     The value and importance of advance care planning    Exploration of personal, cultural or spiritual beliefs that might influence medical decisions   Exploration of goals of care in the event of a sudden injury or illness   Identification  of a healthcare agent - Tiffany Bailey  Review and updating or creation of an  advance directive document:  Has living will. Not familiar with MOST. States Tiffany Bailey helps her make her health decisions. I spoke with the power of attorney Tiffany Bailey. I introduced myself and asked if Patient had an advance directive. Tiffany Bailey was not sure but thought she had filled out some thing. Since patient has moved  she's not sure where her paperwork is. I will discuss a MOST  form on her next visit and leave this for patient to review with her family.  2. Symptom Management:   Blood glucose: States she had a meter packed away but we found one and she checked. 185 mg/ dl pc.    Pain: Endorses pain in L AKA. States she is not able to use prosthesis due to pain. Meds reviewed.   Head aches: States she frequently has headaches for which she takes Fioricet. She does not use it each time bur endorse migraine like headaches. Recommend f/u with neurology for pain addressed.  Life review: States she has 7 children and 3 husbands. She has recently moved into the current home with one of her sons.   3. Follow up Palliative Care Visit: Palliative care will continue to follow for goals of care clarification and symptom management. Return 6 weeks or prn.  4. Family /Caregiver/Community Supports: Has 7 children, living with son. Has home visiting NP, known to [facility] neurology.  5. Cognitive / Functional decline:  A and o x 2, forgetful of current events. Able to tell stories from her childhood.   I spent 60 minutes providing this consultation,  from 1200 to 1300. More than 50% of the time in this consultation was spent in counseling and care coordination.  CODE STATUS: FULL  PPS: 50%  HOSPICE ELIGIBILITY/DIAGNOSIS: TBD  Subjective:  CHIEF COMPLAINT: pain (headache and L phantom pain)  HISTORY OF PRESENT ILLNESS:  Tiffany Bailey is a 87 y.o. year old female  with DM, htn, headaches, breast cancer  stage 4. Endorses pain in head and L AKA., in context of DM. Nothing helps phantom pain, did not tolerate gabapentin in the past. Headache Is intermittent.   We are asked to consult around advance care planning and complex medical decision making.    Review and summarization of old Epic records shows or history from other than patient.  Review or lab tests, radiology,  or medicine.   History obtained from review  of EMR, discussion with primary team, and  interview with family, caregiver  and/or Tiffany Bailey. Records reviewed and summarized above.   CURRENT PROBLEM LIST:  Patient Active Problem List   Diagnosis Date Noted  . Fatigue 12/15/2019  . Osteopenia 10/25/2019  . Use of anastrozole (Arimidex) 08/18/2019  . Anemia due to stage 3a chronic kidney disease ([place]) 08/18/2019  . 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 Nausea 08/03/2019  . Breast cancer ([place]) 07/19/2019  . Goals of care, counseling/discussion 07/19/2019  . Lung mass 02/10/2019  . Constipation 01/27/2019  . Headache 01/25/2019  . Right leg swelling 01/25/2019  . Metastatic breast cancer ([place]) 01/05/2019  . Anxiety and depression 12/16/2018  . Hx of AKA (above knee amputation), left ([place]) 08/26/2018  . Left arm swelling 07/01/2018  . Carotid stenosis 12/19/2017  . CAD (coronary artery disease) 10/23/2017  . Hb-SS disease without crisis ([place]) 10/23/2017  . History of CHF (congestive heart failure) 10/23/2017  . Old myocardial infarction 10/23/2017  . Hand cramp 10/17/2017  . Acute non-recurrent maxillary sinusitis 10/17/2017  . Skin lesion 09/29/2017  . Diarrhea 04/23/2017  . Weight loss 09/12/2016  . CKD stage 3 due to type 2 diabetes mellitus ([place]) 08/14/2016  . PAD (peripheral artery disease) ([place]) 08/14/2016  . DM (diabetes mellitus) ([place]) 03/25/2016  . Anemia 03/25/2016  . Chronic tension-type headache, not intractable 03/25/2016  . Essential hypertension 03/25/2016  . CAD (coronary artery disease), native coronary artery 03/25/2016  . GERD (gastroesophageal reflux disease) 03/25/2016  . Hyperlipidemia 03/25/2016  . History of breast cancer 03/25/2016  . AKI (acute kidney injury) ([place]) 01/28/2016  . Hyperkalemia 01/28/2016  . Hyponatremia 01/28/2016  . Urinary retention 01/28/2016  . Hypertensive urgency 01/15/2016  . History of nonadherence to medical treatment 09/08/2015  . Epigastric pain 08/10/2014  .  Ischemic heart disease due to coronary artery obstruction ([place]) 01/07/2013   PAST MEDICAL HISTORY:  Active Ambulatory Problems    Diagnosis Date Noted  . DM (diabetes mellitus) ([place]) 03/25/2016  . Anemia 03/25/2016  . Chronic tension-type headache, not intractable 03/25/2016  . Essential hypertension 03/25/2016  . CAD (coronary artery disease), native coronary artery 03/25/2016  . GERD (gastroesophageal reflux disease) 03/25/2016  . Hyperlipidemia 03/25/2016  . History of breast cancer 03/25/2016  . CKD stage 3 due to type 2 diabetes mellitus ([place]) 08/14/2016  . PAD (peripheral artery disease) ([place]) 08/14/2016  . Weight loss 09/12/2016  . Diarrhea 04/23/2017  . Skin lesion 09/29/2017  . Hand cramp 10/17/2017  . Acute non-recurrent maxillary sinusitis 10/17/2017  . Carotid stenosis 12/19/2017  . Left arm swelling 07/01/2018  . Hx of AKA (above knee amputation), left ([place]) 08/26/2018  . Anxiety and depression 12/16/2018  . AKI (acute kidney injury) ([place]) 01/28/2016  . CAD (coronary artery disease) 10/23/2017  . Metastatic breast cancer ([place]) 01/05/2019  . Epigastric pain 08/10/2014  . Hb-SS disease without crisis ([place]) 10/23/2017  . History of CHF (congestive heart failure) 10/23/2017  . History of nonadherence to medical treatment 09/08/2015  . Hyperkalemia 01/28/2016  . Hypertensive  urgency 01/15/2016  . Hyponatremia 01/28/2016  . Ischemic heart disease due to coronary artery obstruction ([place]) 01/07/2013  . Old myocardial infarction 10/23/2017  . Urinary retention 01/28/2016  . Headache 01/25/2019  . Right leg swelling 01/25/2019  . Constipation 01/27/2019  . Lung mass 02/10/2019  . Breast cancer ([place]) 07/19/2019  . Goals of care, counseling/discussion 07/19/2019  . 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 Nausea 08/03/2019  . Use of anastrozole (Arimidex) 08/18/2019  . Anemia due to stage 3a chronic kidney disease ([place]) 08/18/2019  . Osteopenia 10/25/2019  . Fatigue  12/15/2019   Resolved Ambulatory Problems    Diagnosis Date Noted  . No Resolved Ambulatory Problems   Past Medical History:  Diagnosis Date  . Anginal pain ([place])   . Arthritis   . Cancer ([place])   . CKD (chronic kidney disease), stage III ([place])   . Coronary artery disease   . Diabetes mellitus without complication ([place])   . Edema   . Failure to thrive in adult   . HOH (hard of hearing)   . Hypertension   . Myocardial infarction ([place])   . Palpitations    SOCIAL HX:  Social History   Tobacco Use  . Smoking status: Never Smoker  . Smokeless tobacco: Never Used  Substance Use Topics  . Alcohol use: No   FAMILY HX:  Family History  Problem Relation Age of Onset  . Sudden death Mother   . Arthritis Father   . Stroke Father   . Breast cancer Sister   . Diabetes Grandchild       ALLERGIES:  Allergies  Allergen Reactions  . Amoxicillin Hives and Nausea Only  . Gabapentin Other (See Comments)    AMS Confusion  . Mirtazapine Itching    Dry mouth with ODT Denies SOB with medication  . Other     UNKNOWN PAIN MEDICATION - unknown reaction     PERTINENT MEDICATIONS:  Outpatient Encounter Medications as of 08/01/2020  Medication Sig  . amLODipine (NORVASC) 10 MG tablet TAKE 1 TABLET BY MOUTH  DAILY  . anastrozole (ARIMIDEX) 1 MG tablet TAKE 1 TABLET BY MOUTH  DAILY  . aspirin EC 81 MG tablet Take 1 tablet (81 mg total) by mouth daily.  Marland Kitchen atorvastatin (LIPITOR) 40 MG tablet TAKE 1 TABLET BY MOUTH  DAILY  . blood glucose meter kit and supplies KIT Dispense based on patient and insurance preference. Use up to four times daily as directed. (FOR ICD-9 250.00, 250.01).  Marland Kitchen clopidogrel (PLAVIX) 75 MG tablet TAKE 1 TABLET BY MOUTH  DAILY  . fluticasone (FLONASE) 50 MCG/ACT nasal spray Place 1 spray into both nostrils daily.  Marland Kitchen glucose blood (ONE TOUCH TEST STRIPS) test strip Use to test blood sugar twice daily  . hydrALAZINE (APRESOLINE) 10 MG tablet Take 10 mg by mouth 2 (two)  times daily.  Marland Kitchen ibuprofen (ADVIL) 100 MG/5ML suspension Take 200 mg by mouth every 8 (eight) hours as needed.  . insulin degludec (TRESIBA FLEXTOUCH) 200 UNIT/ML FlexTouch Pen Inject 6 Units into the skin.  . Insulin Pen Needle (BD PEN NEEDLE NANO U/F) 32G X 4 MM MISC USE 1 DAILY  . ipratropium (ATROVENT) 0.03 % nasal spray Place 2 sprays into both nostrils every 12 (twelve) hours.  . metoprolol tartrate (LOPRESSOR) 25 MG tablet TAKE 1 TABLET BY MOUTH  TWICE DAILY  . nitroGLYCERIN (NITROSTAT) 0.4 MG SL tablet Place 1 tablet (0.4 mg total) under the tongue every 5 (five) minutes as needed for  chest pain.  . ONE TOUCH LANCETS MISC Use as directed 2 times per day  . Chromium-Cinnamon (CINNAMON PLUS CHROMIUM) [phone] MCG-MG CAPS Take 2 capsules by mouth daily. (Patient not taking: No sig reported)  . CORTISPORIN-TC 3.08-17-08-0.5 MG/ML OTIC suspension SMARTSIG:In Ear(s) (Patient not taking: Reported on 08/01/2020)  . ferrous sulfate 325 (65 FE) MG EC tablet Take 1 tablet (325 mg total) by mouth 2 (two) times daily with a meal. (Patient not taking: No sig reported)  . megestrol (MEGACE) 40 MG/ML suspension   . vitamin B-12 (CYANOCOBALAMIN) 1000 MCG tablet Take 1,000 mcg by mouth daily. (Patient not taking: Reported on 08/01/2020)   No facility-administered encounter medications on file as of 08/01/2020.    Objective: ROS  General: NAD EYES: endorses vision changes ENMT: denies dysphagia Cardiovascular: denies chest pain Pulmonary: denies  cough, denies increased SOB Abdomen: endorses fair appetite, denies constipation, endorses continence of bowel GU: denies dysuria, endorses continence of urine MSK:  endorses ROM limitations, no falls reported, L  AKA Skin: denies rashes or wounds Neurological: endorses general weakness, endorses pain, denies insomnia Psych: Endorses positive mood Heme/lymph/immuno: denies bruises, abnormal bleeding  Physical Exam: Current and past weights: 115  lbs Constitutional: 168/97 HR 68 RR 18 General: frail appearing, thin EYES: anicteric sclera, lids intact, no discharge  ENMT: intact hearing,oral mucous membranes moist, dentition intact CV:  no LE edema Pulmonary:  no increased work of breathing, no cough, no audible wheezes, room air Abdomen: intake 50%, normo-active BS +  4 quadrants, soft and non tender, no ascites, BG 185 pc GU: deferred MSK: mod sarcopenia, decreased ROM in all extremities, L AKA, , non ambulatory Skin: warm and dry, no rashes or wounds on visible skin Neuro: Generalized weakness, mild cognitive impairment Psych: non-anxious affect, A and O x 2-3 Hem/lymph/immuno: no widespread bruising   Thank you for the opportunity to participate in the care of Ms. Ziller.  The palliative care team will continue to follow. Please call our office at [phone] if we can be of additional assistance.  Jason Coop, NP , DNP, MPH, AGPCNP-BC, ACHPN   COVID-19 PATIENT SCREENING TOOL  Person answering questions: _______self____________   1.  Is the patient or any family member in the home showing any signs or symptoms regarding respiratory infection?                  Person with Symptom  ______________na___________ a. Fever/chills/headache                                                        Yes___ No__X_            b. Shortness of breath                                                            Yes___ No__X_           c. Cough/congestion  Yes___  No__X_          d. Muscle/Body aches/pains                                                   Yes___ No__X_         e. Gastrointestinal symptoms (diarrhea,nausea)             Yes___ No__X_         f. Sudden loss of smell or taste      Yes___ No__X_        2. Within the past 10 days, has anyone living in the home had any contact with someone with or under investigation for COVID-19?    Yes___ No__X__   Person  __________________

## 2020-08-18 DIAGNOSIS — Z139 Encounter for screening, unspecified: Secondary | ICD-10-CM | POA: Diagnosis not present

## 2020-09-08 DIAGNOSIS — Z79899 Other long term (current) drug therapy: Secondary | ICD-10-CM | POA: Diagnosis not present

## 2020-09-08 DIAGNOSIS — Z7982 Long term (current) use of aspirin: Secondary | ICD-10-CM | POA: Diagnosis not present

## 2020-09-08 DIAGNOSIS — Z888 Allergy status to other drugs, medicaments and biological substances status: Secondary | ICD-10-CM | POA: Diagnosis not present

## 2020-09-08 DIAGNOSIS — Z7984 Long term (current) use of oral hypoglycemic drugs: Secondary | ICD-10-CM | POA: Diagnosis not present

## 2020-09-08 DIAGNOSIS — E119 Type 2 diabetes mellitus without complications: Secondary | ICD-10-CM | POA: Diagnosis not present

## 2020-09-08 DIAGNOSIS — Z794 Long term (current) use of insulin: Secondary | ICD-10-CM | POA: Diagnosis not present

## 2020-09-08 DIAGNOSIS — R519 Headache, unspecified: Secondary | ICD-10-CM | POA: Diagnosis not present

## 2020-09-08 DIAGNOSIS — Z853 Personal history of malignant neoplasm of breast: Secondary | ICD-10-CM | POA: Diagnosis not present

## 2020-09-08 DIAGNOSIS — K219 Gastro-esophageal reflux disease without esophagitis: Secondary | ICD-10-CM | POA: Diagnosis not present

## 2020-09-08 DIAGNOSIS — I252 Old myocardial infarction: Secondary | ICD-10-CM | POA: Diagnosis not present

## 2020-09-08 DIAGNOSIS — I10 Essential (primary) hypertension: Secondary | ICD-10-CM | POA: Diagnosis not present

## 2020-09-08 DIAGNOSIS — Z7902 Long term (current) use of antithrombotics/antiplatelets: Secondary | ICD-10-CM | POA: Diagnosis not present

## 2020-09-08 DIAGNOSIS — Z881 Allergy status to other antibiotic agents status: Secondary | ICD-10-CM | POA: Diagnosis not present

## 2020-09-12 ENCOUNTER — Other Ambulatory Visit: Payer: Self-pay

## 2020-09-12 ENCOUNTER — Other Ambulatory Visit: Payer: Medicare Other | Admitting: Primary Care

## 2020-09-12 DIAGNOSIS — Z8679 Personal history of other diseases of the circulatory system: Secondary | ICD-10-CM | POA: Diagnosis not present

## 2020-09-12 DIAGNOSIS — Z515 Encounter for palliative care: Secondary | ICD-10-CM

## 2020-09-12 DIAGNOSIS — G4459 Other complicated headache syndrome: Secondary | ICD-10-CM

## 2020-09-12 DIAGNOSIS — C50919 Malignant neoplasm of unspecified site of unspecified female breast: Secondary | ICD-10-CM | POA: Diagnosis not present

## 2020-09-12 DIAGNOSIS — Z89612 Acquired absence of left leg above knee: Secondary | ICD-10-CM

## 2020-09-12 NOTE — Progress Notes (Signed)
[facility] Consult Note Telephone: [phone]  Fax: [phone]    Date of encounter: 09/12/20 PATIENT NAME: Tiffany Bailey [place] [id number]   [phone] (home)  DOB: 1927-11-08 MRN: [id number] PRIMARY CARE PROVIDER:    Lauretta Grill, NP,  [address] [address] [place] [address] [phone]  REFERRING PROVIDER:   Lauretta Grill, NP [place],  [place] [address] [phone]  RESPONSIBLE PARTY:    Contact Information    Name Relation Home Work Mobile   Ratliff,[name] F Daughter [phone]   evalie, hargraves   [phone]   Dixon Boos Daughter   [phone]   Annamary Carolin Daughter   [phone]      I met face to face with patient and family in home. Palliative Care was asked to follow this patient by consultation request of  Lauretta Grill, NP to address advance care planning and complex medical decision making. This is the follow up visit.   ASSESSMENT AND RECOMMENDATIONS:   1. Advance Care Planning/Goals of Care: Goals include to maximize quality of life and symptom management. Our advance care planning conversation included a discussion about:     The value and importance of advance care planning.  Exploration of goals of care in the event of a sudden injury or illness   Identification  of a healthcare agent - Daughter Engineer, maintenance of an  advance directive document.  MOST form with Full scope of intervention.   2. Symptom Management:   Pain: Went to ED for headache. She could try Advil 800 mg but would not be advisable for long term. Has used Fioricet in the past but not on list now. PCP to assess to renew this order please.  Neuropathy: Endorses L phantom pain. Cannot take gabapentin. May benefit from pain management or neurology consultation.  Eye appt for glaucoma: Has not had a f/u for some time. Son Ronalee Belts to follow up with eye exams. Needs probably to be  back on her eye drops for glaucoma which she's stopped.  3. Follow up Palliative Care Visit: Palliative care will continue to follow for goals of care clarification and symptom management. Return 8 weeks or prn.  4. Family /Caregiver/Community Supports: Lives with son and other children help out.  5. Cognitive / Functional decline: A and O x 2, does not remember short term well. Needs help with adls, dependent in iadls.   I spent 60 minutes providing this consultation,  from 1200 to 1300.  More than 50% of the time in this consultation was spent in counseling and care coordination.   CODE STATUS: FULL CODE  PPS: 40%  HOSPICE ELIGIBILITY/DIAGNOSIS: TBD  CHIEF COMPLAINT: headaches  HISTORY OF PRESENT ILLNESS:  Tiffany Bailey is a 87 y.o. year old female  with h/o DM, glaucoma, headaches, metastatic breast cancer, L AKA, with headaches recently escalating. She has pain in her Left eye, and reports not much relief from OTC. She states she has not had eye appt for some years to assess her glaucoma.  History obtained from review of EMR, discussion with primary team, and  interview with family, caregiver  and/or Ms. Aldea. Records reviewed and summarized above.   CURRENT PROBLEM LIST:  Patient Active Problem List   Diagnosis Date Noted  . Fatigue 12/15/2019  . Osteopenia 10/25/2019  . Use of anastrozole (Arimidex) 08/18/2019  . Anemia due to stage 3a chronic kidney disease ([place]) 08/18/2019  . 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  Nausea 08/03/2019  . Breast cancer ([place]) 07/19/2019  . Goals of care, counseling/discussion 07/19/2019  . Lung mass 02/10/2019  . Constipation 01/27/2019  . Headache 01/25/2019  . Right leg swelling 01/25/2019  . Metastatic breast cancer ([place]) 01/05/2019  . Anxiety and depression 12/16/2018  . Hx of AKA (above knee amputation), left ([place]) 08/26/2018  . Left arm swelling 07/01/2018  . Carotid stenosis 12/19/2017  . CAD (coronary artery disease)  10/23/2017  . Hb-SS disease without crisis ([place]) 10/23/2017  . History of CHF (congestive heart failure) 10/23/2017  . Old myocardial infarction 10/23/2017  . Hand cramp 10/17/2017  . Acute non-recurrent maxillary sinusitis 10/17/2017  . Skin lesion 09/29/2017  . Diarrhea 04/23/2017  . Weight loss 09/12/2016  . CKD stage 3 due to type 2 diabetes mellitus ([place]) 08/14/2016  . PAD (peripheral artery disease) ([place]) 08/14/2016  . DM (diabetes mellitus) ([place]) 03/25/2016  . Anemia 03/25/2016  . Chronic tension-type headache, not intractable 03/25/2016  . Essential hypertension 03/25/2016  . CAD (coronary artery disease), native coronary artery 03/25/2016  . GERD (gastroesophageal reflux disease) 03/25/2016  . Hyperlipidemia 03/25/2016  . History of breast cancer 03/25/2016  . AKI (acute kidney injury) ([place]) 01/28/2016  . Hyperkalemia 01/28/2016  . Hyponatremia 01/28/2016  . Urinary retention 01/28/2016  . Hypertensive urgency 01/15/2016  . History of nonadherence to medical treatment 09/08/2015  . Epigastric pain 08/10/2014  . Ischemic heart disease due to coronary artery obstruction ([place]) 01/07/2013   PAST MEDICAL HISTORY:  Active Ambulatory Problems    Diagnosis Date Noted  . DM (diabetes mellitus) ([place]) 03/25/2016  . Anemia 03/25/2016  . Chronic tension-type headache, not intractable 03/25/2016  . Essential hypertension 03/25/2016  . CAD (coronary artery disease), native coronary artery 03/25/2016  . GERD (gastroesophageal reflux disease) 03/25/2016  . Hyperlipidemia 03/25/2016  . History of breast cancer 03/25/2016  . CKD stage 3 due to type 2 diabetes mellitus ([place]) 08/14/2016  . PAD (peripheral artery disease) ([place]) 08/14/2016  . Weight loss 09/12/2016  . Diarrhea 04/23/2017  . Skin lesion 09/29/2017  . Hand cramp 10/17/2017  . Acute non-recurrent maxillary sinusitis 10/17/2017  . Carotid stenosis 12/19/2017  . Left arm swelling 07/01/2018  . Hx of AKA (above knee  amputation), left ([place]) 08/26/2018  . Anxiety and depression 12/16/2018  . AKI (acute kidney injury) ([place]) 01/28/2016  . CAD (coronary artery disease) 10/23/2017  . Metastatic breast cancer ([place]) 01/05/2019  . Epigastric pain 08/10/2014  . Hb-SS disease without crisis ([place]) 10/23/2017  . History of CHF (congestive heart failure) 10/23/2017  . History of nonadherence to medical treatment 09/08/2015  . Hyperkalemia 01/28/2016  . Hypertensive urgency 01/15/2016  . Hyponatremia 01/28/2016  . Ischemic heart disease due to coronary artery obstruction ([place]) 01/07/2013  . Old myocardial infarction 10/23/2017  . Urinary retention 01/28/2016  . Headache 01/25/2019  . Right leg swelling 01/25/2019  . Constipation 01/27/2019  . Lung mass 02/10/2019  . Breast cancer ([facility]) 07/19/2019  . Goals of care, counseling/discussion 07/19/2019  . 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 Nausea 08/03/2019  . Use of anastrozole (Arimidex) 08/18/2019  . Anemia due to stage 3a chronic kidney disease ([facility]) 08/18/2019  . Osteopenia 10/25/2019  . Fatigue 12/15/2019   Resolved Ambulatory Problems    Diagnosis Date Noted  . No Resolved Ambulatory Problems   Past Medical History:  Diagnosis Date  . Anginal pain ([place])   . Arthritis   . Cancer ([facility])   . CKD (chronic kidney disease), stage III (  [place])   . Coronary artery disease   . Diabetes mellitus without complication ([place])   . Edema   . Failure to thrive in adult   . HOH (hard of hearing)   . Hypertension   . Myocardial infarction ([place])   . Palpitations    SOCIAL HX:  Social History   Tobacco Use  . Smoking status: Never Smoker  . Smokeless tobacco: Never Used  Substance Use Topics  . Alcohol use: No   FAMILY HX:  Family History  Problem Relation Age of Onset  . Sudden death Mother   . Arthritis Father   . Stroke Father   . Breast cancer Sister   . Diabetes Grandchild       ALLERGIES:  Allergies  Allergen Reactions  . Amoxicillin  Hives and Nausea Only  . Gabapentin Other (See Comments)    AMS Confusion  . Mirtazapine Itching    Dry mouth with ODT Denies SOB with medication  . Other     UNKNOWN PAIN MEDICATION - unknown reaction     PERTINENT MEDICATIONS:  Outpatient Encounter Medications as of 09/12/2020  Medication Sig  . amLODipine (NORVASC) 10 MG tablet TAKE 1 TABLET BY MOUTH  DAILY  . anastrozole (ARIMIDEX) 1 MG tablet TAKE 1 TABLET BY MOUTH  DAILY  . aspirin EC 81 MG tablet Take 1 tablet (81 mg total) by mouth daily.  Marland Kitchen atorvastatin (LIPITOR) 40 MG tablet TAKE 1 TABLET BY MOUTH  DAILY  . blood glucose meter kit and supplies KIT Dispense based on patient and insurance preference. Use up to four times daily as directed. (FOR ICD-9 250.00, 250.01).  . Chromium-Cinnamon (CINNAMON PLUS CHROMIUM) [phone] MCG-MG CAPS Take 2 capsules by mouth daily. (Patient not taking: No sig reported)  . clopidogrel (PLAVIX) 75 MG tablet TAKE 1 TABLET BY MOUTH  DAILY  . CORTISPORIN-TC 3.08-17-08-0.5 MG/ML OTIC suspension SMARTSIG:In Ear(s) (Patient not taking: Reported on 08/01/2020)  . ferrous sulfate 325 (65 FE) MG EC tablet Take 1 tablet (325 mg total) by mouth 2 (two) times daily with a meal. (Patient not taking: No sig reported)  . fluticasone (FLONASE) 50 MCG/ACT nasal spray Place 1 spray into both nostrils daily.  Marland Kitchen glucose blood (ONE TOUCH TEST STRIPS) test strip Use to test blood sugar twice daily  . hydrALAZINE (APRESOLINE) 10 MG tablet Take 10 mg by mouth 2 (two) times daily.  Marland Kitchen ibuprofen (ADVIL) 100 MG/5ML suspension Take 200 mg by mouth every 8 (eight) hours as needed.  . insulin degludec (TRESIBA FLEXTOUCH) 200 UNIT/ML FlexTouch Pen Inject 6 Units into the skin.  . Insulin Pen Needle (BD PEN NEEDLE NANO U/F) 32G X 4 MM MISC USE 1 DAILY  . ipratropium (ATROVENT) 0.03 % nasal spray Place 2 sprays into both nostrils every 12 (twelve) hours.  . megestrol (MEGACE) 40 MG/ML suspension   . metoprolol tartrate (LOPRESSOR) 25  MG tablet TAKE 1 TABLET BY MOUTH  TWICE DAILY  . nitroGLYCERIN (NITROSTAT) 0.4 MG SL tablet Place 1 tablet (0.4 mg total) under the tongue every 5 (five) minutes as needed for chest pain.  . ONE TOUCH LANCETS MISC Use as directed 2 times per day  . vitamin B-12 (CYANOCOBALAMIN) 1000 MCG tablet Take 1,000 mcg by mouth daily. (Patient not taking: Reported on 08/01/2020)   No facility-administered encounter medications on file as of 09/12/2020.   ROS  General: NAD EYES: endorses +  vision changes, eye aches. endorse head ache on R. ENMT: denies dysphagia Cardiovascular: denies chest  pain, denies DOE Pulmonary: denies cough, denies increased SOB Abdomen: endorses good appetite, denies constipation, endorses continence of bowel GU: denies dysuria, endorses continence of urine MSK:  endorses weakness,  no falls reported in past month Skin: denies rashes or wounds Neurological: endorses  Head pain, denies insomnia Psych: Endorses positive mood  Physical Exam: Current and past weights: unavailable Constitutional: 180/86  HR: 68  RR:  18 General: frail appearing, thin EYES: anicteric sclera, lids intact, no discharge  ENMT: intact hearing, oral mucous membranes moist CV: RRR,  no LE edema Pulmonary:  no increased work of breathing, no cough, room air Abdomen: intake 75%,  no ascites, unable to use glucose meter. MSK: + sarcopenia, moves all extremities, non ambulatory, transfers to w/c, L  AKA amputation with phantom pains Skin: warm and dry, no rashes or wounds on visible skin Neuro:  no generalized weakness,  + cognitive impairment Psych: non-anxious affect, A and O x 2  Thank you for the opportunity to participate in the care of Ms. Engelmann.  The palliative care team will continue to follow. Please call our office at [phone] if we can be of additional assistance.   Jason Coop, NP , DNP, MPH, AGPCNP-BC, ACHPN   COVID-19 PATIENT SCREENING TOOL Asked and negative  response unless otherwise noted:   Have you had symptoms of covid, tested positive or been in contact with someone with symptoms/positive test in the past 5-10 days?

## 2020-09-14 ENCOUNTER — Encounter: Payer: Self-pay | Admitting: Oncology

## 2020-09-14 ENCOUNTER — Inpatient Hospital Stay: Payer: Medicare Other | Attending: Oncology

## 2020-09-14 ENCOUNTER — Inpatient Hospital Stay (HOSPITAL_BASED_OUTPATIENT_CLINIC_OR_DEPARTMENT_OTHER): Payer: Medicare Other | Admitting: Oncology

## 2020-09-14 VITALS — BP 184/71 | HR 72 | Temp 97.2°F | Resp 18 | Wt 88.5 lb

## 2020-09-14 DIAGNOSIS — C50912 Malignant neoplasm of unspecified site of left female breast: Secondary | ICD-10-CM | POA: Insufficient documentation

## 2020-09-14 DIAGNOSIS — E785 Hyperlipidemia, unspecified: Secondary | ICD-10-CM | POA: Insufficient documentation

## 2020-09-14 DIAGNOSIS — M8580 Other specified disorders of bone density and structure, unspecified site: Secondary | ICD-10-CM

## 2020-09-14 DIAGNOSIS — Z823 Family history of stroke: Secondary | ICD-10-CM | POA: Insufficient documentation

## 2020-09-14 DIAGNOSIS — Z888 Allergy status to other drugs, medicaments and biological substances status: Secondary | ICD-10-CM | POA: Diagnosis not present

## 2020-09-14 DIAGNOSIS — Z880 Allergy status to penicillin: Secondary | ICD-10-CM | POA: Insufficient documentation

## 2020-09-14 DIAGNOSIS — Z803 Family history of malignant neoplasm of breast: Secondary | ICD-10-CM | POA: Insufficient documentation

## 2020-09-14 DIAGNOSIS — N1831 Chronic kidney disease, stage 3a: Secondary | ICD-10-CM | POA: Diagnosis not present

## 2020-09-14 DIAGNOSIS — C7801 Secondary malignant neoplasm of right lung: Secondary | ICD-10-CM | POA: Insufficient documentation

## 2020-09-14 DIAGNOSIS — I700 Atherosclerosis of aorta: Secondary | ICD-10-CM | POA: Insufficient documentation

## 2020-09-14 DIAGNOSIS — R9082 White matter disease, unspecified: Secondary | ICD-10-CM | POA: Diagnosis not present

## 2020-09-14 DIAGNOSIS — C50919 Malignant neoplasm of unspecified site of unspecified female breast: Secondary | ICD-10-CM

## 2020-09-14 DIAGNOSIS — J90 Pleural effusion, not elsewhere classified: Secondary | ICD-10-CM | POA: Insufficient documentation

## 2020-09-14 DIAGNOSIS — I252 Old myocardial infarction: Secondary | ICD-10-CM | POA: Insufficient documentation

## 2020-09-14 DIAGNOSIS — I129 Hypertensive chronic kidney disease with stage 1 through stage 4 chronic kidney disease, or unspecified chronic kidney disease: Secondary | ICD-10-CM | POA: Diagnosis not present

## 2020-09-14 DIAGNOSIS — Z833 Family history of diabetes mellitus: Secondary | ICD-10-CM | POA: Insufficient documentation

## 2020-09-14 DIAGNOSIS — D631 Anemia in chronic kidney disease: Secondary | ICD-10-CM

## 2020-09-14 DIAGNOSIS — Z79811 Long term (current) use of aromatase inhibitors: Secondary | ICD-10-CM | POA: Insufficient documentation

## 2020-09-14 DIAGNOSIS — E1122 Type 2 diabetes mellitus with diabetic chronic kidney disease: Secondary | ICD-10-CM | POA: Diagnosis not present

## 2020-09-14 DIAGNOSIS — R5383 Other fatigue: Secondary | ICD-10-CM | POA: Insufficient documentation

## 2020-09-14 DIAGNOSIS — Z79899 Other long term (current) drug therapy: Secondary | ICD-10-CM | POA: Insufficient documentation

## 2020-09-14 DIAGNOSIS — R109 Unspecified abdominal pain: Secondary | ICD-10-CM | POA: Diagnosis not present

## 2020-09-14 DIAGNOSIS — K5900 Constipation, unspecified: Secondary | ICD-10-CM | POA: Diagnosis not present

## 2020-09-14 DIAGNOSIS — Z8261 Family history of arthritis: Secondary | ICD-10-CM | POA: Diagnosis not present

## 2020-09-14 DIAGNOSIS — R5382 Chronic fatigue, unspecified: Secondary | ICD-10-CM | POA: Diagnosis not present

## 2020-09-14 DIAGNOSIS — D571 Sickle-cell disease without crisis: Secondary | ICD-10-CM | POA: Insufficient documentation

## 2020-09-14 LAB — CBC WITH DIFFERENTIAL/PLATELET
Abs Immature Granulocytes: 0.02 10*3/uL (ref 0.00–0.07)
Basophils Absolute: 0 10*3/uL (ref 0.0–0.1)
Basophils Relative: 1 %
Eosinophils Absolute: 0.1 10*3/uL (ref 0.0–0.5)
Eosinophils Relative: 3 %
HCT: 33 % — ABNORMAL LOW (ref 36.0–46.0)
Hemoglobin: 10.7 g/dL — ABNORMAL LOW (ref 12.0–15.0)
Immature Granulocytes: 1 %
Lymphocytes Relative: 28 %
Lymphs Abs: 1.2 10*3/uL (ref 0.7–4.0)
MCH: 29.7 pg (ref 26.0–34.0)
MCHC: 32.4 g/dL (ref 30.0–36.0)
MCV: 91.7 fL (ref 80.0–100.0)
Monocytes Absolute: 0.4 10*3/uL (ref 0.1–1.0)
Monocytes Relative: 10 %
Neutro Abs: 2.4 10*3/uL (ref 1.7–7.7)
Neutrophils Relative %: 57 %
Platelets: 279 10*3/uL (ref 150–400)
RBC: 3.6 MIL/uL — ABNORMAL LOW (ref 3.87–5.11)
RDW: 13.8 % (ref 11.5–15.5)
WBC: 4.2 10*3/uL (ref 4.0–10.5)
nRBC: 0 % (ref 0.0–0.2)

## 2020-09-14 LAB — COMPREHENSIVE METABOLIC PANEL
ALT: 20 U/L (ref 0–44)
AST: 26 U/L (ref 15–41)
Albumin: 3.5 g/dL (ref 3.5–5.0)
Alkaline Phosphatase: 101 U/L (ref 38–126)
Anion gap: 11 (ref 5–15)
BUN: 31 mg/dL — ABNORMAL HIGH (ref 8–23)
CO2: 21 mmol/L — ABNORMAL LOW (ref 22–32)
Calcium: 9 mg/dL (ref 8.9–10.3)
Chloride: 105 mmol/L (ref 98–111)
Creatinine, Ser: 1.25 mg/dL — ABNORMAL HIGH (ref 0.44–1.00)
GFR, Estimated: 41 mL/min — ABNORMAL LOW (ref >60)
Glucose, Bld: 109 mg/dL — ABNORMAL HIGH (ref 70–99)
Potassium: 4.4 mmol/L (ref 3.5–5.1)
Sodium: 137 mmol/L (ref 135–145)
Total Bilirubin: 0.5 mg/dL (ref 0.3–1.2)
Total Protein: 6.5 g/dL (ref 6.5–8.1)

## 2020-09-14 NOTE — Progress Notes (Signed)
Pt here for follow up. No new concerns voiced.   

## 2020-09-14 NOTE — Progress Notes (Signed)
Hematology/Oncology  Follow up note [facility] [phone] [phone]   Patient Care Team: Lauretta Grill, NP as PCP - General (Nurse Practitioner) Minna Merritts, MD as PCP - Cardiology (Cardiology) Lauretta Grill, NP as Nurse Practitioner (Nurse Practitioner) Jason Coop, NP as Nurse Practitioner (Hospice and Palliative Medicine)  REFERRING PROVIDER: Lauretta Grill, NP  CHIEF COMPLAINTS/REASON FOR VISIT:  Follow up for breast cancer  HISTORY OF PRESENTING ILLNESS:   Tiffany Bailey is a  87 y.o.  female with PMH listed below was seen in consultation at the request of  Lauretta Grill, NP  for evaluation of abnormal CT scan. Patient was recently evaluated by primary care provider for complaints of urinary retention, headache, constipation. 02/03/2019 x-ray of abdomen showed large colonic stool volume, nodular densities noted in the right mid lung are up). 02/08/2019 subsequent chest x-ray two-view showed hyperinflation of the lungs compatible with COPD.  Multiple nodular densities project over the mid and lower right lung. CT chest was obtained for further evaluation. 02/17/2019 CT chest without contrast Showed multiple bilateral pulmonary nodules,groundglass opacities. Largest solid nodule at the confluence of the right major and minor fissures measuring 1.6 cm.  Most notable groundglass nodule is of the right upper lobe and measure 1.8 x 0.8 cm, small right pleural effusion with associated atelectasis or consolidation.  Larger solid nodule are highly suspicious for metastatic disease. Bulky right mediastinal and hilar lymph node.  Largest pretracheal nodes measuring 3.5 x 3 cm.  There are additional bulky superior mediastinal, supraclavicular, left subpectoral lymph nodes.  Largest subpectoral nodes measuring 3.3 x 2.5 cm.  Patient was sent to cancer center for further evaluation.  #Patient reports a history of left breast cancer  status post mastectomy.  She is a poor historian.  She is not able to recall the timeframe of breast cancer diagnosis and then treatments.  Denies any chemotherapy or radiation treatments.  #Left lower extremity history of amputation, CKD, History of MI, Sickle cell disease, DM, HTN, anemia.   Patient denies shortness of breath, chest pain, hemoptysis.  Night sweating, abdominal pain. Reports intentional weight loss.  Appetite is poor.  Not eating much. Also have wax and wane, chronic intermittent headache, she takes Tylenol as needed, with some relief.  Denies any nausea vomiting. She sees neurology. 11/10/2018 CT head showed no acute intracranial abnormality or significant interval changes.  Stable atrophy and white matter disease.  Atherosclerosis.  #Patient had a consultation visit with me on 02/19/2019.  At that time he was referred for abnormal CT scan.  PET scan was obtained and I recommend patient to obtain left axillary lymph node biopsy.  Patient/family called back and decided not to proceed with any additional work-up.  #February 2020 started Arimidex INTERVAL HISTORY Tiffany Bailey is a 87 y.o. female who has above history reviewed by me today presents for follow up visit for management of metastatic breast cancer. Problems and complaints are listed below: Patient was accompanied by her daughter.   Patient reports feeling well.  She has no new complaints.  Chronic fatigue unchanged.   Review of Systems  Constitutional: Positive for fatigue. Negative for appetite change, chills, fever and unexpected weight change.  HENT:   Negative for hearing loss and voice change.   Eyes: Negative for eye problems.  Respiratory: Negative for chest tightness, cough and shortness of breath.   Cardiovascular: Negative for chest pain.  Gastrointestinal: Negative for abdominal distention, abdominal pain, blood in stool and constipation.  Endocrine: Negative for hot flashes.  Genitourinary: Negative for  difficulty urinating and frequency.   Musculoskeletal: Negative for arthralgias.       Left lower extremity history of amputation, intermittent sharp pain  Skin: Negative for itching and rash.  Neurological: Negative for extremity weakness and headaches.  Hematological: Negative for adenopathy.  Psychiatric/Behavioral: Negative for confusion.    MEDICAL HISTORY:  Past Medical History:  Diagnosis Date  . Anemia   . Anginal pain ([place])   . Arthritis   . Breast cancer ([place]) 07/19/2019  . Cancer (HCC)    BREAST  . CKD (chronic kidney disease), stage III ([place])   . Coronary artery disease    S/P CABG and multiple PCI's  . Diabetes mellitus without complication ([place])   . Edema   . Failure to thrive in adult   . GERD (gastroesophageal reflux disease)   . Hb-SS disease without crisis ([place]) 10/23/2017  . History of breast cancer   . HOH (hard of hearing)   . Hyperlipidemia   . Hypertension   . Myocardial infarction ([place])   . Palpitations     SURGICAL HISTORY: Past Surgical History:  Procedure Laterality Date  . ABDOMINAL HYSTERECTOMY    . ABOVE KNEE LEG AMPUTATION Left 2013  . ANTERIOR VITRECTOMY Left 11/16/2015   Procedure: ANTERIOR VITRECTOMY;  Surgeon: Eulogio Bear, MD;  Location: [facility] ORS;  Service: Ophthalmology;  Laterality: Left;  . BLADDER SURGERY    . CATARACT EXTRACTION W/PHACO Left 11/16/2015   Procedure: CATARACT EXTRACTION PHACO AND INTRAOCULAR LENS PLACEMENT (IOC);  Surgeon: Eulogio Bear, MD;  Location: [facility] ORS;  Service: Ophthalmology;  Laterality: Left;  Lot # [id number] H [place]; 01:22.8 AP%:12.6 CDE: 10.46  . CATARACT EXTRACTION W/PHACO Right 08/22/2016   Procedure: CATARACT EXTRACTION PHACO AND INTRAOCULAR LENS PLACEMENT (IOC);  Surgeon: Eulogio Bear, MD;  Location: [facility] ORS;  Service: Ophthalmology;  Laterality: Right;  Lot # [id number] H [place]: 00:52.1 AP%:8.7 CDE: 4.99   . CORONARY ANGIOPLASTY     STENT  . CORONARY ARTERY BYPASS GRAFT    . LOWER EXTREMITY  ANGIOGRAPHY Right 01/14/2019   Procedure: LOWER EXTREMITY ANGIOGRAPHY;  Surgeon: Algernon Huxley, MD;  Location: [facility];  Service: Cardiovascular;  Laterality: Right;  . MASTECTOMY    . TUMOR REMOVAL     ABDOMINAL    SOCIAL HISTORY: Social History   Socioeconomic History  . Marital status: Divorced    Spouse name: Not on file  . Number of children: 7  . Years of education: Not on file  . Highest education level: Not on file  Occupational History  . 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 Smoking status: Never Smoker  . Smokeless tobacco: Never Used  Substance and Sexual Activity  . Alcohol use: No  . Drug use: No  . Sexual activity: Not Currently  Other Topics Concern  . Not on file  Social History Narrative  . Not on file   Social Determinants of Health   Financial Resource Strain: Not on file  Food Insecurity: Not on file  Transportation Needs: Not on file  Physical Activity: Not on file  Stress: Not on file  Social Connections: Not on file  Intimate Partner Violence: Not on file    FAMILY HISTORY: Family History  Problem Relation Age of Onset  . Sudden death Mother   . Arthritis Father   . Stroke Father   . Breast cancer Sister   .  Diabetes Grandchild     ALLERGIES:  is allergic to amoxicillin, gabapentin, mirtazapine, and other.  MEDICATIONS:  Current Outpatient Medications  Medication Sig Dispense Refill  . amLODipine (NORVASC) 10 MG tablet TAKE 1 TABLET BY MOUTH  DAILY 90 tablet 0  . anastrozole (ARIMIDEX) 1 MG tablet TAKE 1 TABLET BY MOUTH  DAILY 90 tablet 3  . aspirin EC 81 MG tablet Take 1 tablet (81 mg total) by mouth daily. 30 tablet 5  . atorvastatin (LIPITOR) 40 MG tablet TAKE 1 TABLET BY MOUTH  DAILY 90 tablet 3  . blood glucose meter kit and supplies KIT Dispense based on patient and insurance preference. Use up to four times daily as directed. (FOR ICD-9 250.00, 250.01). 1 each 0  . Chromium-Cinnamon  (CINNAMON PLUS CHROMIUM) [phone] MCG-MG CAPS Take 2 capsules by mouth daily.    . clopidogrel (PLAVIX) 75 MG tablet TAKE 1 TABLET BY MOUTH  DAILY 90 tablet 0  . fluticasone (FLONASE) 50 MCG/ACT nasal spray Place 1 spray into both nostrils daily. 16 g 5  . glucose blood (ONE TOUCH TEST STRIPS) test strip Use to test blood sugar twice daily 200 each 3  . hydrALAZINE (APRESOLINE) 10 MG tablet Take 10 mg by mouth 2 (two) times daily.    Marland Kitchen ibuprofen (ADVIL) 100 MG/5ML suspension Take 200 mg by mouth every 8 (eight) hours as needed.    . insulin degludec (TRESIBA FLEXTOUCH) 200 UNIT/ML FlexTouch Pen Inject 6 Units into the skin.    . Insulin Pen Needle (BD PEN NEEDLE NANO U/F) 32G X 4 MM MISC USE 1 DAILY 100 each 12  . megestrol (MEGACE) 40 MG/ML suspension     . metoprolol tartrate (LOPRESSOR) 25 MG tablet TAKE 1 TABLET BY MOUTH  TWICE DAILY 180 tablet 3  . ONE TOUCH LANCETS MISC Use as directed 2 times per day 200 each 3  . vitamin B-12 (CYANOCOBALAMIN) 1000 MCG tablet Take 1,000 mcg by mouth daily.    . vitamin C (ASCORBIC ACID) 250 MG tablet Take 250 mg by mouth daily.    . CORTISPORIN-TC 3.08-17-08-0.5 MG/ML OTIC suspension SMARTSIG:In Ear(s) (Patient not taking: No sig reported)    . ferrous sulfate 325 (65 FE) MG EC tablet Take 1 tablet (325 mg total) by mouth 2 (two) times daily with a meal. (Patient not taking: Reported on 09/14/2020) 60 tablet 1  . ipratropium (ATROVENT) 0.03 % nasal spray Place 2 sprays into both nostrils every 12 (twelve) hours. (Patient not taking: Reported on 09/14/2020) 30 mL 12  . nitroGLYCERIN (NITROSTAT) 0.4 MG SL tablet Place 1 tablet (0.4 mg total) under the tongue every 5 (five) minutes as needed for chest pain. (Patient not taking: Reported on 09/14/2020) 25 tablet 4   No current facility-administered medications for this visit.     PHYSICAL EXAMINATION: ECOG PERFORMANCE STATUS: 2 - Symptomatic, <50% confined to bed Vitals:   09/14/20 1028  BP: (!) 184/71   Pulse: 72  Resp: 18  Temp: (!) 97.2 F (36.2 C)   Filed Weights   09/14/20 1028  Weight: 88 lb 8 oz (40.1 kg)    Physical Exam Constitutional:      General: She is not in acute distress.    Comments: She sits in the wheel chair.  Frail appearance  HENT:     Head: Normocephalic and atraumatic.  Eyes:     General: No scleral icterus.    Pupils: Pupils are equal, round, and reactive to light.  Cardiovascular:  Rate and Rhythm: Normal rate and regular rhythm.     Heart sounds: Normal heart sounds.  Pulmonary:     Effort: Pulmonary effort is normal. No respiratory distress.     Breath sounds: No wheezing.     Comments: Decreased breath sound right lower lobe.  Good air entry on the left base.  Abdominal:     General: Bowel sounds are normal. There is no distension.     Palpations: Abdomen is soft.  Musculoskeletal:        General: No deformity. Normal range of motion.     Cervical back: Normal range of motion and neck supple.     Comments: Left lower extremity history of amputation.   Skin:    General: Skin is warm and dry.     Findings: No erythema or rash.  Neurological:     Mental Status: She is alert and oriented to person, place, and time. Mental status is at baseline.     Cranial Nerves: No cranial nerve deficit.     Coordination: Coordination normal.  Psychiatric:        Mood and Affect: Mood normal.      LABORATORY DATA:  I have reviewed the data as listed Lab Results  Component Value Date   WBC 4.2 09/14/2020   HGB 10.7 (L) 09/14/2020   HCT 33.0 (L) 09/14/2020   MCV 91.7 09/14/2020   PLT 279 09/14/2020   Recent Labs    10/25/19 1247 12/08/19 1252 03/08/20 1255 06/13/20 1255 09/14/20 0957  NA 134* 135 137 135 137  K 3.6 3.9 4.1 4.1 4.4  CL 106 104 108 107 105  CO2 20* 21* 20* 17* 21*  GLUCOSE 203* 301* 223* 328* 109*  BUN 31* 27* 35* 27* 31*  CREATININE 1.22* 1.17* 1.41* 1.47* 1.25*  CALCIUM 8.7* 8.6* 9.0 8.3* 9.0  GFRNONAA 39* 41* 33*  34* 41*  GFRAA 45* 48* 38*  --   --   PROT 6.5 7.0 7.1 6.1* 6.5  ALBUMIN 3.4* 3.6 3.7 3.2* 3.5  AST 31 27 22 26 26   ALT 33 24 18 22 20   ALKPHOS 81 102 122 107 101  BILITOT 0.7 0.5 0.5 0.6 0.5   Iron/TIBC/Ferritin/ %Sat    Component Value Date/Time   IRON 60 06/13/2020 1255   TIBC 249 (L) 06/13/2020 1255   FERRITIN 77 06/13/2020 1255   IRONPCTSAT 24 06/13/2020 1255   IRONPCTSAT 27 06/19/2018 1006      RADIOGRAPHIC STUDIES: I have personally reviewed the radiological images as listed and agreed with the findings in the report. No results found.    ASSESSMENT & PLAN:  1. Metastatic breast cancer ([place])   2. Use of anastrozole (Arimidex)   3. Anemia due to stage 3a chronic kidney disease ([place])   4. Osteopenia, unspecified location    #Metastatic breast cancer, Labs are reviewed and discussed with patient. Clinically patient is doing very well. Continue Arimidex 1 mg daily. CDK inhibitor was not added given patient's advanced age, performance status, medical problems and patient's preference. I will obtain CT chest abdomen pelvis for evaluation of disease status and treatment response.  Osteopenia, bone density scan results were discussed with patient and daughter.  She has baseline osteopenia. Continue calcium and vitamin D supplementation. Discussed about bisphosphonate and recommend patient to obtain dental clearance  # uncontrolled diabetes.  Recommend patient to communicate with primary care provider.  Recommend diabetic diet. # Anemia of CKD, hemoglobin is 10.7.  Stable.  Monitor.  Orders Placed This Encounter  Procedures  . CT CHEST ABDOMEN PELVIS WO CONTRAST    Standing Status:   Future    Standing Expiration Date:   September 19, 2021    Order Specific Question:   If indicated for the ordered procedure, I authorize the administration of contrast media per Radiology protocol    Answer:   Yes    Order Specific Question:   Preferred imaging location?    Answer:    [facility]    Order Specific Question:   Is Oral Contrast requested for this exam?    Answer:   Yes, Per Radiology protocol  . CBC with Differential/Platelet    Standing Status:   Future    Standing Expiration Date:   Sep 19, 2021  . Comprehensive metabolic panel    Standing Status:   Future    Standing Expiration Date:   09/19/2021    All questions were answered. The patient knows to call the clinic with any problems questions or concerns. Return of visit:  3 months.   [name], MD, PhD Hematology Oncology [facility] at [facility] Pager- [phone] 09/14/2020

## 2020-09-20 DIAGNOSIS — E119 Type 2 diabetes mellitus without complications: Secondary | ICD-10-CM | POA: Diagnosis not present

## 2020-09-20 DIAGNOSIS — Z139 Encounter for screening, unspecified: Secondary | ICD-10-CM | POA: Diagnosis not present

## 2020-09-20 DIAGNOSIS — I10 Essential (primary) hypertension: Secondary | ICD-10-CM | POA: Diagnosis not present

## 2020-09-21 DIAGNOSIS — H04123 Dry eye syndrome of bilateral lacrimal glands: Secondary | ICD-10-CM | POA: Diagnosis not present

## 2020-09-28 ENCOUNTER — Ambulatory Visit: Admission: RE | Admit: 2020-09-28 | Payer: Medicare Other | Source: Ambulatory Visit

## 2020-10-12 DIAGNOSIS — Z139 Encounter for screening, unspecified: Secondary | ICD-10-CM | POA: Diagnosis not present

## 2020-11-02 ENCOUNTER — Other Ambulatory Visit: Payer: Self-pay

## 2020-11-02 ENCOUNTER — Other Ambulatory Visit: Payer: Medicare Other | Admitting: Primary Care

## 2020-11-02 DIAGNOSIS — Z515 Encounter for palliative care: Secondary | ICD-10-CM

## 2020-11-02 DIAGNOSIS — E1140 Type 2 diabetes mellitus with diabetic neuropathy, unspecified: Secondary | ICD-10-CM | POA: Diagnosis not present

## 2020-11-02 NOTE — Progress Notes (Signed)
[facility] Consult Note Telephone: [phone]  Fax: [phone]    Date of encounter: 11/02/20 PATIENT NAME: Tiffany Bailey [place] [id number]   [phone] (home)  DOB: 1928/05/01 MRN: [id number] PRIMARY CARE PROVIDER:    Lauretta Grill, NP,  [address] [address] [place] [address] [phone]  REFERRING PROVIDER:   Lauretta Grill, NP [address] [place] [place],  [place] [address] [phone]  RESPONSIBLE PARTY:    Contact Information    Name Relation Home Work Mobile   Ratliff,[name] F Daughter [phone]   jannessa, ogden   [phone]   Dixon Boos Daughter   [phone]   Annamary Carolin Daughter   [phone]       I met face to face with patient and family in  home. Palliative Care was asked to follow this patient by consultation request of  Lauretta Grill, NP to address advance care planning and complex medical decision making. This is a follow up visit.                                   ASSESSMENT AND PLAN / RECOMMENDATIONS:   Advance Care Planning/Goals of Care: Goals include to maximize quality of life and symptom management. Our advance care planning conversation included a discussion about:     The value and importance of advance care planning   Exploration of personal, cultural or spiritual beliefs that might influence medical decisions   Review  of an  advance directive document .  CODE STATUS: FULL  Gave MOST again, she has not had time to discuss with family  Symptom Management/Plan:  Headaches improved. States she got an antibiotic pill and that helped. Missed appt for CT, states she didn't know about it. Cancer center to reschedule. POA [name] is aware of missed procedure and re scheduling process.  Endorses ongoing phantom pain in L LE and leg pain on the R. Recommend Lyrica if she can tolerate. She did not tolerate gabapentin.  Endorses blurry vision but not taking  any eyedrops. Recommended her to f/u with eye care provider to assess need for glaucoma treatment.   Follow up Palliative Care Visit: Palliative care will continue to follow for complex medical decision making, advance care planning, and clarification of goals. Return 12 weeks or prn.  I spent 40 minutes providing this consultation. More than 50% of the time in this consultation was spent in counseling and care coordination.  PPS: 40%  HOSPICE ELIGIBILITY/DIAGNOSIS: TBD  Chief Complaint: pain  HISTORY OF PRESENT ILLNESS:  Tiffany Bailey is a 87 y.o. year old female  with chronic pain from neuropathy, L AKA, dementia  History obtained from review of EMR, discussion with primary team, and interview with family, facility staff/caregiver and/or Ms. Rosal.  I reviewed available labs, medications, imaging, studies and related documents from the EMR.  Records reviewed and summarized above.   ROS  General: NAD EYES: endorses vision changes ENMT: denies dysphagia Cardiovascular: denies chest pain, denies DOE Pulmonary: denies cough, denies increased SOB Abdomen: endorses good appetite, endorses constipation, endorses continence of bowel GU: denies dysuria, endorses continence of urine MSK:  Endorses  weakness,  no falls reported Skin: denies rashes or wounds Neurological: endorses  pain, denies insomnia Psych: Endorses positive mood Heme/lymph/immuno: denies bruises, abnormal bleeding  Physical Exam: Current and past weights:reports in 60's but unavailable.  Constitutional: NAD General: frail appearing, thin EYES:  anicteric sclera, lids intact, no discharge  ENMT: intact hearing, oral mucous membranes moist, dentition intact CV:  no LE edema Pulmonary:  no increased work of breathing, + cough, room air Abdomen: intake 100%, no ascites MSK:  Severe sarcopenia, moves all extremities, non- ambulatory, uses w/c Skin: warm and dry, no rashes or wounds on visible skin Neuro:  +  generalized weakness,  Moderate cognitive impairment Psych: slightly anxious affect, A and O x 2 Hem/lymph/immuno: no widespread bruising  Outpatient Encounter Medications as of 11/02/2020  Medication Sig  . amLODipine (NORVASC) 10 MG tablet TAKE 1 TABLET BY MOUTH  DAILY  . anastrozole (ARIMIDEX) 1 MG tablet TAKE 1 TABLET BY MOUTH  DAILY  . aspirin EC 81 MG tablet Take 1 tablet (81 mg total) by mouth daily.  Marland Kitchen atorvastatin (LIPITOR) 40 MG tablet TAKE 1 TABLET BY MOUTH  DAILY  . blood glucose meter kit and supplies KIT Dispense based on patient and insurance preference. Use up to four times daily as directed. (FOR ICD-9 250.00, 250.01).  . Chromium-Cinnamon (CINNAMON PLUS CHROMIUM) [phone] MCG-MG CAPS Take 2 capsules by mouth daily.  . clopidogrel (PLAVIX) 75 MG tablet TAKE 1 TABLET BY MOUTH  DAILY  . CORTISPORIN-TC 3.08-17-08-0.5 MG/ML OTIC suspension SMARTSIG:In Ear(s) (Patient not taking: No sig reported)  . ferrous sulfate 325 (65 FE) MG EC tablet Take 1 tablet (325 mg total) by mouth 2 (two) times daily with a meal. (Patient not taking: Reported on 09/14/2020)  . fluticasone (FLONASE) 50 MCG/ACT nasal spray Place 1 spray into both nostrils daily.  Marland Kitchen glucose blood (ONE TOUCH TEST STRIPS) test strip Use to test blood sugar twice daily  . hydrALAZINE (APRESOLINE) 10 MG tablet Take 10 mg by mouth 2 (two) times daily.  Marland Kitchen ibuprofen (ADVIL) 100 MG/5ML suspension Take 200 mg by mouth every 8 (eight) hours as needed.  . insulin degludec (TRESIBA FLEXTOUCH) 200 UNIT/ML FlexTouch Pen Inject 6 Units into the skin.  . Insulin Pen Needle (BD PEN NEEDLE NANO U/F) 32G X 4 MM MISC USE 1 DAILY  . ipratropium (ATROVENT) 0.03 % nasal spray Place 2 sprays into both nostrils every 12 (twelve) hours. (Patient not taking: Reported on 09/14/2020)  . megestrol (MEGACE) 40 MG/ML suspension   . metoprolol tartrate (LOPRESSOR) 25 MG tablet TAKE 1 TABLET BY MOUTH  TWICE DAILY  . nitroGLYCERIN (NITROSTAT) 0.4 MG SL tablet  Place 1 tablet (0.4 mg total) under the tongue every 5 (five) minutes as needed for chest pain. (Patient not taking: Reported on 09/14/2020)  . ONE TOUCH LANCETS MISC Use as directed 2 times per day  . vitamin B-12 (CYANOCOBALAMIN) 1000 MCG tablet Take 1,000 mcg by mouth daily.  . vitamin C (ASCORBIC ACID) 250 MG tablet Take 250 mg by mouth daily.   No facility-administered encounter medications on file as of 11/02/2020.    Thank you for the opportunity to participate in the care of Ms. Buntyn.  The palliative care team will continue to follow. Please call our office at [phone] if we can be of additional assistance.   Jason Coop, NP , DNP, MPH, AGPCNP-BC, ACHPN  COVID-19 PATIENT SCREENING TOOL Asked and negative response unless otherwise noted:   Have you had symptoms of covid, tested positive or been in contact with someone with symptoms/positive test in the past 5-10 days?

## 2020-11-06 ENCOUNTER — Other Ambulatory Visit (INDEPENDENT_AMBULATORY_CARE_PROVIDER_SITE_OTHER): Payer: Self-pay | Admitting: Nurse Practitioner

## 2020-11-06 DIAGNOSIS — I739 Peripheral vascular disease, unspecified: Secondary | ICD-10-CM

## 2020-11-07 ENCOUNTER — Ambulatory Visit (INDEPENDENT_AMBULATORY_CARE_PROVIDER_SITE_OTHER): Payer: Medicare Other

## 2020-11-07 ENCOUNTER — Other Ambulatory Visit: Payer: Self-pay

## 2020-11-07 ENCOUNTER — Encounter (INDEPENDENT_AMBULATORY_CARE_PROVIDER_SITE_OTHER): Payer: Self-pay | Admitting: Vascular Surgery

## 2020-11-07 ENCOUNTER — Ambulatory Visit (INDEPENDENT_AMBULATORY_CARE_PROVIDER_SITE_OTHER): Payer: Medicare Other | Admitting: Vascular Surgery

## 2020-11-07 VITALS — BP 159/74 | HR 62 | Ht 60.0 in | Wt 80.0 lb

## 2020-11-07 DIAGNOSIS — I10 Essential (primary) hypertension: Secondary | ICD-10-CM | POA: Diagnosis not present

## 2020-11-07 DIAGNOSIS — C801 Malignant (primary) neoplasm, unspecified: Secondary | ICD-10-CM | POA: Insufficient documentation

## 2020-11-07 DIAGNOSIS — Z794 Long term (current) use of insulin: Secondary | ICD-10-CM

## 2020-11-07 DIAGNOSIS — E1159 Type 2 diabetes mellitus with other circulatory complications: Secondary | ICD-10-CM | POA: Diagnosis not present

## 2020-11-07 DIAGNOSIS — Z89612 Acquired absence of left leg above knee: Secondary | ICD-10-CM | POA: Diagnosis not present

## 2020-11-07 DIAGNOSIS — I739 Peripheral vascular disease, unspecified: Secondary | ICD-10-CM

## 2020-11-07 NOTE — Assessment & Plan Note (Signed)
Well-healed

## 2020-11-07 NOTE — Progress Notes (Signed)
MRN : [id number]  Tiffany Bailey is a 87 y.o. (1927/11/08) female who presents with chief complaint of  Chief Complaint  Patient presents with  . Follow-up    1 yr U/S   .  History of Present Illness: Patient returns today in follow up of her PAD.  She is status post left above-knee amputation.  Right leg has arthritic pain but no rest pain or ulceration.  ABI today is 1.0 with fairly good monophasic waveforms.  Digital pressures are good as well.  Current Outpatient Medications  Medication Sig Dispense Refill  . amLODipine (NORVASC) 10 MG tablet TAKE 1 TABLET BY MOUTH  DAILY 90 tablet 0  . anastrozole (ARIMIDEX) 1 MG tablet TAKE 1 TABLET BY MOUTH  DAILY 90 tablet 3  . aspirin EC 81 MG tablet Take 1 tablet (81 mg total) by mouth daily. 30 tablet 5  . atorvastatin (LIPITOR) 40 MG tablet TAKE 1 TABLET BY MOUTH  DAILY 90 tablet 3  . blood glucose meter kit and supplies KIT Dispense based on patient and insurance preference. Use up to four times daily as directed. (FOR ICD-9 250.00, 250.01). 1 each 0  . Chromium-Cinnamon (CINNAMON PLUS CHROMIUM) [phone] MCG-MG CAPS Take 2 capsules by mouth daily.    . clopidogrel (PLAVIX) 75 MG tablet TAKE 1 TABLET BY MOUTH  DAILY 90 tablet 0  . CORTISPORIN-TC 3.08-17-08-0.5 MG/ML OTIC suspension SMARTSIG:In Ear(s)    . ferrous sulfate 325 (65 FE) MG EC tablet Take 1 tablet (325 mg total) by mouth 2 (two) times daily with a meal. 60 tablet 1  . fluticasone (FLONASE) 50 MCG/ACT nasal spray Place 1 spray into both nostrils daily. 16 g 5  . glucose blood (ONE TOUCH TEST STRIPS) test strip Use to test blood sugar twice daily 200 each 3  . hydrALAZINE (APRESOLINE) 10 MG tablet Take 10 mg by mouth 2 (two) times daily.    Marland Kitchen ibuprofen (ADVIL) 100 MG/5ML suspension Take 200 mg by mouth every 8 (eight) hours as needed.    . insulin degludec (TRESIBA FLEXTOUCH) 200 UNIT/ML FlexTouch Pen Inject 6 Units into the skin.    . Insulin Pen Needle (BD PEN NEEDLE NANO U/F) 32G  X 4 MM MISC USE 1 DAILY 100 each 12  . ipratropium (ATROVENT) 0.03 % nasal spray Place 2 sprays into both nostrils every 12 (twelve) hours. 30 mL 12  . megestrol (MEGACE) 40 MG/ML suspension     . metoprolol tartrate (LOPRESSOR) 25 MG tablet TAKE 1 TABLET BY MOUTH  TWICE DAILY 180 tablet 3  . nitroGLYCERIN (NITROSTAT) 0.4 MG SL tablet Place 1 tablet (0.4 mg total) under the tongue every 5 (five) minutes as needed for chest pain. 25 tablet 4  . ONE TOUCH LANCETS MISC Use as directed 2 times per day 200 each 3  . TOUJEO MAX SOLOSTAR 300 UNIT/ML Solostar Pen SMARTSIG:6 Unit(s) SUB-Q Every Night    . vitamin B-12 (CYANOCOBALAMIN) 1000 MCG tablet Take 1,000 mcg by mouth daily.    . vitamin C (ASCORBIC ACID) 250 MG tablet Take 250 mg by mouth daily.     No current facility-administered medications for this visit.    Past Medical History:  Diagnosis Date  . Anemia   . Anginal pain ([place])   . Arthritis   . Breast cancer ([place]) 07/19/2019  . Cancer (HCC)    BREAST  . CKD (chronic kidney disease), stage III ([place])   . Coronary artery disease    S/P CABG  and multiple PCI's  . Diabetes mellitus without complication ([place])   . Edema   . Failure to thrive in adult   . GERD (gastroesophageal reflux disease)   . 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 History of breast cancer   . HOH (hard of hearing)   . Hyperlipidemia   . Hypertension   . Myocardial infarction ([place])   . Palpitations     Past Surgical History:  Procedure Laterality Date  . ABDOMINAL HYSTERECTOMY    . ABOVE KNEE LEG AMPUTATION Left 2013  . ANTERIOR VITRECTOMY Left 11/16/2015   Procedure: ANTERIOR VITRECTOMY;  Surgeon: Eulogio Bear, MD;  Location: [facility];  Service: Ophthalmology;  Laterality: Left;  . BLADDER SURGERY    . CATARACT EXTRACTION W/PHACO Left 11/16/2015   Procedure: CATARACT EXTRACTION PHACO AND INTRAOCULAR LENS PLACEMENT (IOC);  Surgeon: Eulogio Bear, MD;  Location: [facility];  Service: Ophthalmology;   Laterality: Left;  Lot # [id number] H [place]; 01:22.8 AP%:12.6 CDE: 10.46  . CATARACT EXTRACTION W/PHACO Right 08/22/2016   Procedure: CATARACT EXTRACTION PHACO AND INTRAOCULAR LENS PLACEMENT (IOC);  Surgeon: Eulogio Bear, MD;  Location: [facility];  Service: Ophthalmology;  Laterality: Right;  Lot # [id number] H [place]: 00:52.1 AP%:8.7 CDE: 4.99   . CORONARY ANGIOPLASTY     STENT  . CORONARY ARTERY BYPASS GRAFT    . LOWER EXTREMITY ANGIOGRAPHY Right 01/14/2019   Procedure: LOWER EXTREMITY ANGIOGRAPHY;  Surgeon: Algernon Huxley, MD;  Location: [facility];  Service: Cardiovascular;  Laterality: Right;  . MASTECTOMY    . TUMOR REMOVAL     ABDOMINAL     Social History   Tobacco Use  . Smoking status: Never Smoker  . Smokeless tobacco: Never Used  Substance Use Topics  . Alcohol use: No  . Drug use: No      Family History  Problem Relation Age of Onset  . Sudden death Mother   . Arthritis Father   . Stroke Father   . Breast cancer Sister   . Diabetes Grandchild      Allergies  Allergen Reactions  . Amoxicillin Hives and Nausea Only  . Gabapentin Other (See Comments)    AMS Confusion  . Mirtazapine Itching    Dry mouth with ODT Denies SOB with medication  . Other     UNKNOWN PAIN MEDICATION - unknown reaction     REVIEW OF SYSTEMS (Negative unless checked)  Constitutional: [] Weight loss  [] Fever  [] Chills Cardiac: [] Chest pain   [] Chest pressure   [] Palpitations   [] Shortness of breath when laying flat   [] Shortness of breath at rest   [] Shortness of breath with exertion. Vascular:  [] Pain in legs with walking   [] Pain in legs at rest   [] Pain in legs when laying flat   [] Claudication   [] Pain in feet when walking  [] Pain in feet at rest  [] Pain in feet when laying flat   [] History of DVT   [] Phlebitis   [] Swelling in legs   [] Varicose veins   [] Non-healing ulcers Pulmonary:   [] Uses home oxygen   [] Productive cough   [] Hemoptysis   [] Wheeze  [] COPD    [] Asthma Neurologic:  [] Dizziness  [] Blackouts   [] Seizures   [] History of stroke   [] History of TIA  [] Aphasia   [] Temporary blindness   [] Dysphagia   [] Weakness or numbness in arms   [] Weakness or numbness in legs Musculoskeletal:  [x] Arthritis   [] Joint swelling   [x] Joint pain   [] Low  back pain Hematologic:  [] Easy bruising  [] Easy bleeding   [] Hypercoagulable state   [] Anemic   Gastrointestinal:  [] Blood in stool   [] Vomiting blood  [] Gastroesophageal reflux/heartburn   [] Abdominal pain Genitourinary:  [] Chronic kidney disease   [] Difficult urination  [] Frequent urination  [] Burning with urination   [] Hematuria Skin:  [] Rashes   [] Ulcers   [] Wounds Psychological:  [] History of anxiety   []  History of major depression.  Physical Examination  BP (!) 159/74   Pulse 62   Ht 5' (1.524 m)   Wt 80 lb (36.3 kg)   BMI 15.62 kg/m  Gen:  Thin, NAD. Appears younger than stated age. Head: [place]/AT, No temporalis wasting. Ear/Nose/Throat: Hearing grossly intact, nares w/o erythema or drainage Eyes: Conjunctiva clear. Sclera non-icteric Neck: Supple.  Trachea midline Pulmonary:  Good air movement, no use of accessory muscles.  Cardiac: RRR, no JVD Vascular:  Vessel Right Left  Radial Palpable Palpable                          PT 1+ Palpable Not Palpable  DP 1+ Palpable Not Palpable   Gastrointestinal: soft, non-tender/non-distended. No guarding/reflex.  Musculoskeletal: M/S 5/5 throughout.  Left AKA. Right leg without swelling or ulceration Neurologic: Sensation grossly intact in extremities.  Symmetrical.  Speech is fluent.  Psychiatric: Judgment intact, Mood & affect appropriate for pt's clinical situation. Dermatologic: No rashes or ulcers noted.  No cellulitis or open wounds.       Labs Recent Results (from the past 2160 hour(s))  Comprehensive metabolic panel     Status: Abnormal   Collection Time: 09/14/20  9:57 AM  Result Value Ref Range   Sodium 137 135 - 145 mmol/L    Potassium 4.4 3.5 - 5.1 mmol/L   Chloride 105 98 - 111 mmol/L   CO2 21 (L) 22 - 32 mmol/L   Glucose, Bld 109 (H) 70 - 99 mg/dL    Comment: Glucose reference range applies only to samples taken after fasting for at least 8 hours.   BUN 31 (H) 8 - 23 mg/dL   Creatinine, Ser 1.25 (H) 0.44 - 1.00 mg/dL   Calcium 9.0 8.9 - 10.3 mg/dL   Total Protein 6.5 6.5 - 8.1 g/dL   Albumin 3.5 3.5 - 5.0 g/dL   AST 26 15 - 41 U/L   ALT 20 0 - 44 U/L   Alkaline Phosphatase 101 38 - 126 U/L   Total Bilirubin 0.5 0.3 - 1.2 mg/dL   GFR, Estimated 41 (L) >60 mL/min    Comment: (NOTE) Calculated using the CKD-EPI Creatinine Equation (2021)    Anion gap 11 5 - 15    Comment: Performed at [facility], [address] [place], [place] [address]  CBC with Differential/Platelet     Status: Abnormal   Collection Time: 09/14/20  9:57 AM  Result Value Ref Range   WBC 4.2 4.0 - 10.5 K/uL   RBC 3.60 (L) 3.87 - 5.11 MIL/uL   Hemoglobin 10.7 (L) 12.0 - 15.0 g/dL   HCT 33.0 (L) 36.0 - 46.0 %   MCV 91.7 80.0 - 100.0 fL   MCH 29.7 26.0 - 34.0 pg   MCHC 32.4 30.0 - 36.0 g/dL   RDW 13.8 11.5 - 15.5 %   Platelets 279 150 - 400 K/uL   nRBC 0.0 0.0 - 0.2 %   Neutrophils Relative % 57 %   Neutro Abs 2.4 1.7 - 7.7 K/uL  Lymphocytes Relative 28 %   Lymphs Abs 1.2 0.7 - 4.0 K/uL   Monocytes Relative 10 %   Monocytes Absolute 0.4 0.1 - 1.0 K/uL   Eosinophils Relative 3 %   Eosinophils Absolute 0.1 0.0 - 0.5 K/uL   Basophils Relative 1 %   Basophils Absolute 0.0 0.0 - 0.1 K/uL   Immature Granulocytes 1 %   Abs Immature Granulocytes 0.02 0.00 - 0.07 K/uL    Comment: Performed at [facility], [address] [place], [place] [address]    Radiology No results found.  Assessment/Plan  Essential hypertension blood pressure control important in reducing the progression of atherosclerotic disease. On appropriate oral medications.   DM (diabetes mellitus) ([place]) blood glucose control important in  reducing the progression of atherosclerotic disease. Also, involved in wound healing. On appropriate medications.   Hx of AKA (above knee amputation), left (HCC) Well healed  PAD (peripheral artery disease) (HCC) ABI today is 1.0 with fairly good monophasic waveforms.  Digital pressures are good as well.  Stable.  Continue to follow on an annual basis.    Leotis Pain, MD  11/07/2020 11:11 AM    This note was created with Dragon medical transcription system.  Any errors from dictation are purely unintentional

## 2020-11-07 NOTE — Assessment & Plan Note (Signed)
ABI today is 1.0 with fairly good monophasic waveforms.  Digital pressures are good as well.  Stable.  Continue to follow on an annual basis.

## 2020-11-07 NOTE — Assessment & Plan Note (Signed)
blood pressure control important in reducing the progression of atherosclerotic disease. On appropriate oral medications.  

## 2020-11-07 NOTE — Assessment & Plan Note (Signed)
blood glucose control important in reducing the progression of atherosclerotic disease. Also, involved in wound healing. On appropriate medications.  

## 2020-11-08 ENCOUNTER — Emergency Department
Admission: EM | Admit: 2020-11-08 | Discharge: 2020-11-09 | Disposition: A | Discharge status: Home / Self Care | Payer: Medicare Other | Attending: Emergency Medicine | Admitting: Emergency Medicine

## 2020-11-08 ENCOUNTER — Other Ambulatory Visit: Payer: Self-pay

## 2020-11-08 DIAGNOSIS — Z853 Personal history of malignant neoplasm of breast: Secondary | ICD-10-CM | POA: Insufficient documentation

## 2020-11-08 DIAGNOSIS — Z794 Long term (current) use of insulin: Secondary | ICD-10-CM | POA: Insufficient documentation

## 2020-11-08 DIAGNOSIS — D631 Anemia in chronic kidney disease: Secondary | ICD-10-CM | POA: Diagnosis not present

## 2020-11-08 DIAGNOSIS — E1122 Type 2 diabetes mellitus with diabetic chronic kidney disease: Secondary | ICD-10-CM | POA: Diagnosis not present

## 2020-11-08 DIAGNOSIS — I2510 Atherosclerotic heart disease of native coronary artery without angina pectoris: Secondary | ICD-10-CM | POA: Insufficient documentation

## 2020-11-08 DIAGNOSIS — Z79899 Other long term (current) drug therapy: Secondary | ICD-10-CM | POA: Diagnosis not present

## 2020-11-08 DIAGNOSIS — I10 Essential (primary) hypertension: Secondary | ICD-10-CM | POA: Diagnosis not present

## 2020-11-08 DIAGNOSIS — Z7982 Long term (current) use of aspirin: Secondary | ICD-10-CM | POA: Insufficient documentation

## 2020-11-08 DIAGNOSIS — Z955 Presence of coronary angioplasty implant and graft: Secondary | ICD-10-CM | POA: Diagnosis not present

## 2020-11-08 DIAGNOSIS — I509 Heart failure, unspecified: Secondary | ICD-10-CM | POA: Diagnosis not present

## 2020-11-08 DIAGNOSIS — I130 Hypertensive heart and chronic kidney disease with heart failure and stage 1 through stage 4 chronic kidney disease, or unspecified chronic kidney disease: Secondary | ICD-10-CM | POA: Insufficient documentation

## 2020-11-08 DIAGNOSIS — N1831 Chronic kidney disease, stage 3a: Secondary | ICD-10-CM | POA: Diagnosis not present

## 2020-11-08 DIAGNOSIS — E1140 Type 2 diabetes mellitus with diabetic neuropathy, unspecified: Secondary | ICD-10-CM | POA: Insufficient documentation

## 2020-11-08 DIAGNOSIS — R5383 Other fatigue: Secondary | ICD-10-CM | POA: Diagnosis present

## 2020-11-08 LAB — CBC
HCT: 32.2 % — ABNORMAL LOW (ref 36.0–46.0)
Hemoglobin: 11.1 g/dL — ABNORMAL LOW (ref 12.0–15.0)
MCH: 30.4 pg (ref 26.0–34.0)
MCHC: 34.5 g/dL (ref 30.0–36.0)
MCV: 88.2 fL (ref 80.0–100.0)
Platelets: 241 10*3/uL (ref 150–400)
RBC: 3.65 MIL/uL — ABNORMAL LOW (ref 3.87–5.11)
RDW: 14 % (ref 11.5–15.5)
WBC: 6.3 10*3/uL (ref 4.0–10.5)
nRBC: 0 % (ref 0.0–0.2)

## 2020-11-08 LAB — TROPONIN I (HIGH SENSITIVITY)
Troponin I (High Sensitivity): 20 ng/L — ABNORMAL HIGH (ref <18)
Troponin I (High Sensitivity): 22 ng/L — ABNORMAL HIGH (ref <18)

## 2020-11-08 LAB — URINALYSIS, COMPLETE (UACMP) WITH MICROSCOPIC
Bacteria, UA: NONE SEEN
Bilirubin Urine: NEGATIVE
Glucose, UA: >=500 mg/dL — AB
Hgb urine dipstick: NEGATIVE
Ketones, ur: NEGATIVE mg/dL
Leukocytes,Ua: NEGATIVE
Nitrite: NEGATIVE
Protein, ur: NEGATIVE mg/dL
Specific Gravity, Urine: 1.01 (ref 1.005–1.030)
pH: 6 (ref 5.0–8.0)

## 2020-11-08 LAB — BASIC METABOLIC PANEL
Anion gap: 11 (ref 5–15)
BUN: 28 mg/dL — ABNORMAL HIGH (ref 8–23)
CO2: 19 mmol/L — ABNORMAL LOW (ref 22–32)
Calcium: 8.7 mg/dL — ABNORMAL LOW (ref 8.9–10.3)
Chloride: 105 mmol/L (ref 98–111)
Creatinine, Ser: 1.3 mg/dL — ABNORMAL HIGH (ref 0.44–1.00)
GFR, Estimated: 39 mL/min — ABNORMAL LOW (ref >60)
Glucose, Bld: 344 mg/dL — ABNORMAL HIGH (ref 70–99)
Potassium: 4 mmol/L (ref 3.5–5.1)
Sodium: 135 mmol/L (ref 135–145)

## 2020-11-08 MED ORDER — LABETALOL HCL 5 MG/ML IV SOLN: 10.0000 mg | Freq: Once | INTRAVENOUS | Status: DC

## 2020-11-08 MED ORDER — HYDRALAZINE HCL 20 MG/ML IJ SOLN
10.0000 mg | Freq: Once | INTRAMUSCULAR | Status: AC
  Administered 2020-11-08: 10 mg via INTRAVENOUS
  Filled 2020-11-08: qty 1

## 2020-11-08 NOTE — ED Triage Notes (Signed)
Pt states she has been having issues with elevated b/p over the month and today called her PCP, took an extra hydralazine today and instructed to come to the ED for further eval. Pt c/o generalized weakness with fatigue

## 2020-11-08 NOTE — Discharge Instructions (Addendum)
Continue take all of your normal blood pressure medications as prescribed.  Call your primary care provider tomorrow to ask if you should change or increase any of the blood pressure medications that you are currently on.  Return to the ER for new, worsening, or persistent severe weakness, headache, chest pain, persistently elevated blood pressures (especially greater than 200 on the top number or 120 on the bottom number), or any other new or worsening symptoms that concern you.

## 2020-11-08 NOTE — ED Triage Notes (Signed)
First Nurse Note:  C/O HTN.  STates have been having difficulty regulating pressure for about one month.  Sent to ED by PCP.  AAOx3.  Skin warm and dry. NAD

## 2020-11-08 NOTE — ED Provider Notes (Signed)
[facility] Emergency Department Provider Note ____________________________________________   Event Date/Time   First MD Initiated Contact with Patient 11/08/20 1707     (approximate)  I have reviewed the triage vital signs and the nursing notes.   HISTORY  Chief Complaint Weakness and Hypertension    HPI Tiffany Bailey is a 87 y.o. female with PMH as noted below including hypertension, CAD, CKD, diabetes who presents with elevated blood pressure over the last month associated in the last few days with some generalized weakness and fatigue.  The patient states that she called her PCPs nurse line today due to the blood pressure, took an extra dose of hydralazine, and was instructed to come to the emergency department.  She denies any chest pain, shortness of breath, headache, lightheadedness, or other acute symptoms.  Past Medical History:  Diagnosis Date  . Anemia   . Anginal pain ([place])   . Arthritis   . Breast cancer ([place]) 07/19/2019  . Cancer (HCC)    BREAST  . CKD (chronic kidney disease), stage III ([place])   . Coronary artery disease    S/P CABG and multiple PCI's  . Diabetes mellitus without complication ([place])   . Edema   . Failure to thrive in adult   . GERD (gastroesophageal reflux disease)   . 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 History of breast cancer   . HOH (hard of hearing)   . Hyperlipidemia   . Hypertension   . Myocardial infarction ([place])   . Palpitations     Patient Active Problem List   Diagnosis Date Noted  . Cancer ([place]) 11/07/2020  . Type 2 diabetes mellitus with diabetic neuropathy, unspecified ([place]) 11/02/2020  . Fatigue 12/15/2019  . Osteopenia 10/25/2019  . Use of anastrozole (Arimidex) 08/18/2019  . Anemia due to stage 3a chronic kidney disease ([place]) 08/18/2019  . 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 Nausea 08/03/2019  . Breast cancer ([place]) 07/19/2019  . Goals of care, counseling/discussion 07/19/2019   . Lung mass 02/10/2019  . Constipation 01/27/2019  . Headache 01/25/2019  . Right leg swelling 01/25/2019  . Metastatic breast cancer ([place]) 01/05/2019  . Anxiety and depression 12/16/2018  . Hx of AKA (above knee amputation), left ([place]) 08/26/2018  . Left arm swelling 07/01/2018  . Carotid stenosis 12/19/2017  . CAD (coronary artery disease) 10/23/2017  . 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 History of CHF (congestive heart failure) 10/23/2017  . Old myocardial infarction 10/23/2017  . Hand cramp 10/17/2017  . Acute non-recurrent maxillary sinusitis 10/17/2017  . Skin lesion 09/29/2017  . Diarrhea 04/23/2017  . Weight loss 09/12/2016  . CKD stage 3 due to type 2 diabetes mellitus ([place]) 08/14/2016  . PAD (peripheral artery disease) ([place]) 08/14/2016  . DM (diabetes mellitus) ([place]) 03/25/2016  . Anemia 03/25/2016  . Chronic tension-type headache, not intractable 03/25/2016  . Essential hypertension 03/25/2016  . CAD (coronary artery disease), native coronary artery 03/25/2016  . GERD (gastroesophageal reflux disease) 03/25/2016  . Hyperlipidemia 03/25/2016  . History of breast cancer 03/25/2016  . AKI (acute kidney injury) ([place]) 01/28/2016  . Hyperkalemia 01/28/2016  . Hyponatremia 01/28/2016  . Urinary retention 01/28/2016  . Hypertensive urgency 01/15/2016  . History of nonadherence to medical treatment 09/08/2015  . Epigastric pain 08/10/2014  . Ischemic heart disease due to coronary artery obstruction ([place]) 01/07/2013    Past Surgical History:  Procedure Laterality Date  . ABDOMINAL HYSTERECTOMY    . ABOVE  KNEE LEG AMPUTATION Left 2013  . ANTERIOR VITRECTOMY Left 11/16/2015   Procedure: ANTERIOR VITRECTOMY;  Surgeon: Eulogio Bear, MD;  Location: [facility] ORS;  Service: Ophthalmology;  Laterality: Left;  . BLADDER SURGERY    . CATARACT EXTRACTION W/PHACO Left 11/16/2015   Procedure: CATARACT EXTRACTION PHACO AND INTRAOCULAR LENS PLACEMENT (IOC);  Surgeon:  Eulogio Bear, MD;  Location: [facility] ORS;  Service: Ophthalmology;  Laterality: Left;  Lot # [id number] H [place]; 01:22.8 AP%:12.6 CDE: 10.46  . CATARACT EXTRACTION W/PHACO Right 08/22/2016   Procedure: CATARACT EXTRACTION PHACO AND INTRAOCULAR LENS PLACEMENT (IOC);  Surgeon: Eulogio Bear, MD;  Location: [facility] ORS;  Service: Ophthalmology;  Laterality: Right;  Lot # [id number] H [place]: 00:52.1 AP%:8.7 CDE: 4.99   . CORONARY ANGIOPLASTY     STENT  . CORONARY ARTERY BYPASS GRAFT    . LOWER EXTREMITY ANGIOGRAPHY Right 01/14/2019   Procedure: LOWER EXTREMITY ANGIOGRAPHY;  Surgeon: Algernon Huxley, MD;  Location: [facility];  Service: Cardiovascular;  Laterality: Right;  . MASTECTOMY    . TUMOR REMOVAL     ABDOMINAL    Prior to Admission medications   Medication Sig Start Date End Date Taking? Authorizing Provider  amLODipine (NORVASC) 10 MG tablet TAKE 1 TABLET BY MOUTH  DAILY Patient taking differently: Take 10 mg by mouth daily. 06/29/20  Yes [name], [name], MD  anastrozole (ARIMIDEX) 1 MG tablet TAKE 1 TABLET BY MOUTH  DAILY Patient taking differently: Take 1 mg by mouth daily. 05/23/20  Yes [name], MD  aspirin EC 81 MG tablet Take 1 tablet (81 mg total) by mouth daily. 01/15/17  Yes Cook, Jayce G, DO  atorvastatin (LIPITOR) 40 MG tablet TAKE 1 TABLET BY MOUTH  DAILY Patient taking differently: Take 40 mg by mouth daily. 05/25/20  Yes [name], FNP  clopidogrel (PLAVIX) 75 MG tablet TAKE 1 TABLET BY MOUTH  DAILY Patient taking differently: Take 75 mg by mouth daily. 06/29/20  Yes Minna Merritts, MD  ferrous sulfate 325 (65 FE) MG EC tablet Take 1 tablet (325 mg total) by mouth 2 (two) times daily with a meal. 08/17/19  Yes [name], MD  hydrALAZINE (APRESOLINE) 10 MG tablet Take 10 mg by mouth 2 (two) times daily. 04/02/19  Yes [provider]  insulin degludec (TRESIBA FLEXTOUCH) 200 UNIT/ML FlexTouch Pen Inject 6 Units into the skin. 05/04/20  Yes [provider]  ipratropium (ATROVENT) 0.03 % nasal spray Place 2 sprays into both nostrils every 12 (twelve) hours. 12/05/19  Yes Veronese, [place], MD  TOUJEO MAX SOLOSTAR 300 UNIT/ML Solostar Pen SMARTSIG:6 Unit(s) SUB-Q Every Night 09/21/20  Yes [provider]  vitamin B-12 (CYANOCOBALAMIN) 1000 MCG tablet Take 1,000 mcg by mouth daily.   Yes [provider]  vitamin C (ASCORBIC ACID) 250 MG tablet Take 250 mg by mouth daily.   Yes [provider]  blood glucose meter kit and supplies KIT Dispense based on patient and insurance preference. Use up to four times daily as directed. (FOR ICD-9 250.00, 250.01). 01/13/17   [name], DO  Chromium-Cinnamon (CINNAMON PLUS CHROMIUM) [phone] MCG-MG CAPS Take 2 capsules by mouth daily. Patient not taking: Reported on 11/08/2020    [provider]  CORTISPORIN-TC 3.08-17-08-0.5 MG/ML OTIC suspension SMARTSIG:In Ear(s) 01/03/20   [provider]  fluticasone (FLONASE) 50 MCG/ACT nasal spray Place 1 spray into both nostrils daily. 07/08/18   [name], FNP  glucose blood (ONE TOUCH TEST STRIPS) test  strip Use to test blood sugar twice daily 09/17/17   [name], FNP  ibuprofen (ADVIL) 100 MG/5ML suspension Take 200 mg by mouth every 8 (eight) hours as needed.    [provider]  Insulin Pen Needle (BD PEN NEEDLE NANO U/F) 32G X 4 MM MISC USE 1 DAILY 08/03/18   [name], FNP  megestrol (MEGACE) 40 MG/ML suspension  10/21/19   [provider]  metoprolol tartrate (LOPRESSOR) 25 MG tablet TAKE 1 TABLET BY MOUTH  TWICE DAILY Patient not taking: Reported on 11/08/2020 03/29/20   [name], FNP  nitroGLYCERIN (NITROSTAT) 0.4 MG SL tablet Place 1 tablet (0.4 mg total) under the tongue every 5 (five) minutes as needed for chest pain. 12/19/17   Minna Merritts, MD  ONE TOUCH LANCETS MISC Use as directed 2 times per day 09/17/17   [name], FNP    Allergies Amoxicillin,  Gabapentin, Mirtazapine, and Other  Family History  Problem Relation Age of Onset  . Sudden death Mother   . Arthritis Father   . Stroke Father   . Breast cancer Sister   . Diabetes Grandchild     Social History Social History   Tobacco Use  . Smoking status: Never Smoker  . Smokeless tobacco: Never Used  Substance Use Topics  . Alcohol use: No  . Drug use: No    Review of Systems  Constitutional: No fever. Eyes: No visual changes. ENT: No sore throat. Cardiovascular: Denies chest pain. Respiratory: Denies shortness of breath. Gastrointestinal: No vomiting or diarrhea.  Genitourinary: Negative for dysuria.  Musculoskeletal: Negative for back pain. Skin: Negative for rash. Neurological: Negative for headache.   ____________________________________________   PHYSICAL EXAM:  VITAL SIGNS: ED Triage Vitals  Enc Vitals Group     BP 11/08/20 1448 (!) 192/66     Pulse Rate 11/08/20 1448 64     Resp 11/08/20 1448 17     Temp 11/08/20 1448 97.9 F (36.6 C)     Temp Source 11/08/20 1448 Oral     SpO2 11/08/20 1448 99 %     Weight 11/08/20 1449 84 lb (38.1 kg)     Height 11/08/20 1449 5' (1.524 m)     Head Circumference --      Peak Flow --      Pain Score 11/08/20 1449 0     Pain Loc --      Pain Edu? --      Excl. in [place]? --     Constitutional: Alert and oriented. Well appearing for age and in no acute distress. Eyes: Conjunctivae are normal.  Head: Atraumatic. Nose: No congestion/rhinnorhea. Mouth/Throat: Mucous membranes are moist.   Neck: Normal range of motion.  Cardiovascular: Normal rate, regular rhythm. Grossly normal heart sounds.  Good peripheral circulation. Respiratory: Normal respiratory effort.  No retractions. Lungs CTAB. Gastrointestinal: No distention.  Musculoskeletal: No lower extremity edema.  Extremities warm and well perfused.  Neurologic:  Normal speech and language.  Motor intact in all extremities.  Normal coordination.  No gross  focal neurologic deficits are appreciated.  Skin:  Skin is warm and dry. No rash noted. Psychiatric: Mood and affect are normal. Speech and behavior are normal.  ____________________________________________   LABS (all labs ordered are listed, but only abnormal results are displayed)  Labs Reviewed  BASIC METABOLIC PANEL - Abnormal; Notable for the following components:      Result Value   CO2 19 (*)    Glucose,  Bld 344 (*)    BUN 28 (*)    Creatinine, Ser 1.30 (*)    Calcium 8.7 (*)    GFR, Estimated 39 (*)    All other components within normal limits  CBC - Abnormal; Notable for the following components:   RBC 3.65 (*)    Hemoglobin 11.1 (*)    HCT 32.2 (*)    All other components within normal limits  URINALYSIS, COMPLETE (UACMP) WITH MICROSCOPIC - Abnormal; Notable for the following components:   Color, Urine STRAW (*)    APPearance CLEAR (*)    Glucose, UA >=500 (*)    All other components within normal limits  TROPONIN I (HIGH SENSITIVITY) - Abnormal; Notable for the following components:   Troponin I (High Sensitivity) 20 (*)    All other components within normal limits  TROPONIN I (HIGH SENSITIVITY) - Abnormal; Notable for the following components:   Troponin I (High Sensitivity) 22 (*)    All other components within normal limits  CBG MONITORING, ED  TROPONIN I (HIGH SENSITIVITY)   ____________________________________________  EKG  ED ECG REPORT I, Arta Silence, the attending physician, personally viewed and interpreted this ECG.  Date: 11/08/2020 EKG Time: 1521 Rate: 63 Rhythm: normal sinus rhythm QRS Axis: normal Intervals: normal ST/T Wave abnormalities: Inferior and lateral T wave inversions Narrative Interpretation: Nonspecific abnormalities including more prominent inferior and lateral T wave inversions when compared to EKG of  01/20/2020  ____________________________________________  RADIOLOGY    ____________________________________________   PROCEDURES  Procedure(s) performed: No  Procedures  Critical Care performed: No ____________________________________________   INITIAL IMPRESSION / ASSESSMENT AND PLAN / ED COURSE  Pertinent labs & imaging results that were available during my care of the patient were reviewed by me and considered in my medical decision making (see chart for details).  87 year old female with PMH as noted above presents with elevated blood pressures over the last several weeks, with some increased fatigue in the last few days.  On exam the patient is overall well-appearing for her age.  She is significantly hypertensive, with otherwise normal vital signs.  The physical exam is otherwise unremarkable.  EKG shows nonspecific inferior lateral T wave inversions which are more prominent than her most recent EKG from last year, however the patient has no chest pain or other symptoms of ACS.  Overall presentation is consistent with hypertensive urgency, as there is no clear evidence of endorgan dysfunction at this time.  Initial labs are reassuring.  Is unsure of her blood pressure medications but based on the available records in epic which I have reviewed, she appears to be on amlodipine, hydralazine, and metoprolol, and states she took an extra dose of hydralazine today.  We will obtain cardiac enzymes, give IV hydralazine, and reassess.  ----------------------------------------- 10:31 PM on 11/08/2020 -----------------------------------------  Initial troponin was 20.  There is no prior troponin level available for this patient, however I suspect that this minimal elevation is related to the patient's hypertension.  The repeat troponin after 2 hours was 22, which is not a significant increase.  Therefore, there is no evidence of acute ischemia.  Given the lack of chest pain or  shortness of breath and the unchanged EKG, there is no indication for further emergent cardiac work-up.  The patient's blood pressure improved markedly with hydralazine and has remained stable for the last several hours.  She is asymptomatic at this time.  She feels comfortable and wants to go home.  Given that the blood pressure  has stabilized and rest of the work-up is reassuring, she is appropriate for discharge home.  I counseled her on the results of the work-up.  I would not modify her outpatient regimen at this time.  I instructed her to call her PMD tomorrow to discuss any changes or increases in her current medications.  I gave the patient very thorough return precautions and she expressed understanding.  ____________________________________________   FINAL CLINICAL IMPRESSION(S) / ED DIAGNOSES  Final diagnoses:  Hypertension, unspecified type      NEW MEDICATIONS STARTED DURING THIS VISIT:  New Prescriptions   No medications on file     Note:  This document was prepared using Dragon voice recognition software and may include unintentional dictation errors.    Arta Silence, MD 11/08/20 2233

## 2020-11-14 ENCOUNTER — Other Ambulatory Visit: Payer: Self-pay | Admitting: Cardiovascular Disease

## 2020-11-22 DIAGNOSIS — Z139 Encounter for screening, unspecified: Secondary | ICD-10-CM | POA: Diagnosis not present

## 2020-11-29 DIAGNOSIS — Z139 Encounter for screening, unspecified: Secondary | ICD-10-CM | POA: Diagnosis not present

## 2020-12-14 ENCOUNTER — Inpatient Hospital Stay: Payer: Medicare Other

## 2020-12-14 ENCOUNTER — Inpatient Hospital Stay: Payer: Medicare Other | Admitting: Oncology

## 2021-01-01 ENCOUNTER — Telehealth: Payer: Self-pay

## 2021-01-01 ENCOUNTER — Other Ambulatory Visit: Payer: Self-pay | Admitting: Cardiovascular Disease

## 2021-01-01 MED ORDER — ANASTROZOLE 1 MG PO TABS: 1.0000 mg | ORAL_TABLET | Freq: Every day | ORAL | 3 refills | Status: DC

## 2021-01-01 NOTE — Telephone Encounter (Signed)
Please schedule office visit for refills. Thank you! 

## 2021-01-01 NOTE — Telephone Encounter (Signed)
Refill for Anastrazole requested.

## 2021-01-02 ENCOUNTER — Encounter: Payer: Self-pay | Admitting: Oncology

## 2021-01-03 DIAGNOSIS — Z139 Encounter for screening, unspecified: Secondary | ICD-10-CM | POA: Diagnosis not present

## 2021-01-07 ENCOUNTER — Emergency Department
Admission: EM | Admit: 2021-01-07 | Discharge: 2021-01-07 | Disposition: A | Discharge status: Home / Self Care | Payer: Medicare Other | Attending: Emergency Medicine | Admitting: Emergency Medicine

## 2021-01-07 ENCOUNTER — Encounter: Payer: Self-pay | Admitting: Emergency Medicine

## 2021-01-07 ENCOUNTER — Emergency Department: Payer: Medicare Other

## 2021-01-07 ENCOUNTER — Other Ambulatory Visit: Payer: Self-pay

## 2021-01-07 DIAGNOSIS — Z7902 Long term (current) use of antithrombotics/antiplatelets: Secondary | ICD-10-CM | POA: Insufficient documentation

## 2021-01-07 DIAGNOSIS — E1122 Type 2 diabetes mellitus with diabetic chronic kidney disease: Secondary | ICD-10-CM | POA: Insufficient documentation

## 2021-01-07 DIAGNOSIS — I2510 Atherosclerotic heart disease of native coronary artery without angina pectoris: Secondary | ICD-10-CM | POA: Insufficient documentation

## 2021-01-07 DIAGNOSIS — E1140 Type 2 diabetes mellitus with diabetic neuropathy, unspecified: Secondary | ICD-10-CM | POA: Diagnosis not present

## 2021-01-07 DIAGNOSIS — R519 Headache, unspecified: Secondary | ICD-10-CM | POA: Diagnosis not present

## 2021-01-07 DIAGNOSIS — Z79899 Other long term (current) drug therapy: Secondary | ICD-10-CM | POA: Diagnosis not present

## 2021-01-07 DIAGNOSIS — Z853 Personal history of malignant neoplasm of breast: Secondary | ICD-10-CM | POA: Diagnosis not present

## 2021-01-07 DIAGNOSIS — N1831 Chronic kidney disease, stage 3a: Secondary | ICD-10-CM | POA: Diagnosis not present

## 2021-01-07 DIAGNOSIS — Z951 Presence of aortocoronary bypass graft: Secondary | ICD-10-CM | POA: Insufficient documentation

## 2021-01-07 DIAGNOSIS — I6529 Occlusion and stenosis of unspecified carotid artery: Secondary | ICD-10-CM | POA: Diagnosis not present

## 2021-01-07 DIAGNOSIS — Z794 Long term (current) use of insulin: Secondary | ICD-10-CM | POA: Insufficient documentation

## 2021-01-07 DIAGNOSIS — H5712 Ocular pain, left eye: Secondary | ICD-10-CM | POA: Diagnosis not present

## 2021-01-07 DIAGNOSIS — I509 Heart failure, unspecified: Secondary | ICD-10-CM | POA: Diagnosis not present

## 2021-01-07 DIAGNOSIS — Z7982 Long term (current) use of aspirin: Secondary | ICD-10-CM | POA: Insufficient documentation

## 2021-01-07 DIAGNOSIS — I130 Hypertensive heart and chronic kidney disease with heart failure and stage 1 through stage 4 chronic kidney disease, or unspecified chronic kidney disease: Secondary | ICD-10-CM | POA: Diagnosis not present

## 2021-01-07 MED ORDER — CLONIDINE HCL 0.1 MG PO TABS: 0.1000 mg | ORAL_TABLET | Freq: Two times a day (BID) | ORAL | 11 refills | Status: DC

## 2021-01-07 MED ORDER — TETRACAINE HCL 0.5 % OP SOLN
2.0000 [drp] | Freq: Once | OPHTHALMIC | Status: AC
  Administered 2021-01-07: 2 [drp] via OPHTHALMIC

## 2021-01-07 MED ORDER — CLONIDINE HCL 0.1 MG PO TABS
0.1000 mg | ORAL_TABLET | Freq: Once | ORAL | Status: AC
  Administered 2021-01-07: 0.1 mg via ORAL
  Filled 2021-01-07: qty 1

## 2021-01-07 MED ORDER — BUTALBITAL-APAP-CAFFEINE 50-325-40 MG PO TABS
1.0000 | ORAL_TABLET | Freq: Once | ORAL | Status: AC
  Administered 2021-01-07: 1 via ORAL
  Filled 2021-01-07: qty 1

## 2021-01-07 MED ORDER — BUTALBITAL-APAP-CAFFEINE 50-325-40 MG PO TABS: 1.0000 | ORAL_TABLET | Freq: Four times a day (QID) | ORAL | 0 refills | Status: DC | PRN

## 2021-01-07 NOTE — Discharge Instructions (Addendum)
Please take your blood pressure medications as prescribed by your doctor.  Please follow-up with your eye doctor tomorrow as scheduled.  Return to the emergency department for any return of/worsening headache, worsening eye pain or any other symptom personally concerning to yourself.

## 2021-01-07 NOTE — ED Notes (Signed)
meds given for bp, see emar. Will reevaluate bp prior to discharge.

## 2021-01-07 NOTE — ED Notes (Signed)
Dr Kerman Passey aware of current BP. Pt is asymptomatic and states "My blood pressure is all over the place all the time". She states she will take her blood pressure medication when she gets home.

## 2021-01-07 NOTE — ED Triage Notes (Signed)
Pt comes into the ED via POV c/o left eye pain and headache.  Pt states she has a known left eye "growth" which causes her pain at times.  PT states she has been having the pain x 2 days.  Pt currently in NAD at this time with even and unlabored respirations.  Pt explains she has been taking OTC tylenol with no relief.

## 2021-01-07 NOTE — ED Provider Notes (Addendum)
[facility] Emergency Department Provider Note  Time seen: 11:51 AM  I have reviewed the triage vital signs and the nursing notes.   HISTORY  Chief Complaint Eye Pain and Headache   HPI Tiffany Bailey is a 87 y.o. female with a past medical history of anemia, arthritis, CKD, CAD, diabetes, gastric reflux, hypertension, hyperlipidemia, presents emergency department for left eye pain and a left headache.  According to the patient for many months or longer she has had pain to her left eye, follows up with Dr. Edison Pace of ophthalmology in fact has an appointment with him tomorrow.  Patient states she could not sleep last night due to the pain in the eye that was causing her to have a left-sided headache.  Continues to have a headache today has been taking Tylenol last night but has not taken anything today for the headache.  Patient states poor vision in the left eye but this is chronic and no acute change.   Past Medical History:  Diagnosis Date   Anemia    Anginal pain ([place])    Arthritis    Breast cancer ([place]) 07/19/2019   Cancer ([place])    BREAST   CKD (chronic kidney disease), stage III ([place])    Coronary artery disease    S/P CABG and multiple PCI's   Diabetes mellitus without complication ([place])    Edema    Failure to thrive in adult    GERD (gastroesophageal reflux disease)    Hb-SS disease without crisis ([place]) 10/23/2017   History of breast cancer    HOH (hard of hearing)    Hyperlipidemia    Hypertension    Myocardial infarction [facility])    Palpitations     Patient Active Problem List   Diagnosis Date Noted   Cancer ([place]) 11/07/2020   Type 2 diabetes mellitus with diabetic neuropathy, unspecified ([place]) 11/02/2020   Fatigue 12/15/2019   Osteopenia 10/25/2019   Use of anastrozole (Arimidex) 08/18/2019   Anemia due to stage 3a chronic kidney disease ([place]) 08/18/2019   Ulcerative nasal mucositis 08/03/2019   Nausea 08/03/2019   Breast cancer ([place]) 07/19/2019    Goals of care, counseling/discussion 07/19/2019   Lung mass 02/10/2019   Constipation 01/27/2019   Headache 01/25/2019   Right leg swelling 01/25/2019   Metastatic breast cancer ([place]) 01/05/2019   Anxiety and depression 12/16/2018   Hx of AKA (above knee amputation), left ([place]) 08/26/2018   Left arm swelling 07/01/2018   Carotid stenosis 12/19/2017   CAD (coronary artery disease) 10/23/2017   Hb-SS disease without crisis ([place]) 10/23/2017   History of CHF (congestive heart failure) 10/23/2017   Old myocardial infarction 10/23/2017   Hand cramp 10/17/2017   Acute non-recurrent maxillary sinusitis 10/17/2017   Skin lesion 09/29/2017   Diarrhea 04/23/2017   Weight loss 09/12/2016   CKD stage 3 due to type 2 diabetes mellitus ([place]) 08/14/2016   PAD (peripheral artery disease) ([place]) 08/14/2016   DM (diabetes mellitus) ([place]) 03/25/2016   Anemia 03/25/2016   Chronic tension-type headache, not intractable 03/25/2016   Essential hypertension 03/25/2016   CAD (coronary artery disease), native coronary artery 03/25/2016   GERD (gastroesophageal reflux disease) 03/25/2016   Hyperlipidemia 03/25/2016   History of breast cancer 03/25/2016   AKI (acute kidney injury) ([place]) 01/28/2016   Hyperkalemia 01/28/2016   Hyponatremia 01/28/2016   Urinary retention 01/28/2016   Hypertensive urgency 01/15/2016   History of nonadherence to medical treatment 09/08/2015   Epigastric pain 08/10/2014  Ischemic heart disease due to coronary artery obstruction ([place]) 01/07/2013    Past Surgical History:  Procedure Laterality Date   ABDOMINAL HYSTERECTOMY     ABOVE KNEE LEG AMPUTATION Left 2013   ANTERIOR VITRECTOMY Left 11/16/2015   Procedure: ANTERIOR VITRECTOMY;  Surgeon: Eulogio Bear, MD;  Location: [facility] ORS;  Service: Ophthalmology;  Laterality: Left;   BLADDER SURGERY     CATARACT EXTRACTION W/PHACO Left 11/16/2015   Procedure: CATARACT EXTRACTION PHACO AND INTRAOCULAR LENS PLACEMENT (IOC);   Surgeon: Eulogio Bear, MD;  Location: [facility] ORS;  Service: Ophthalmology;  Laterality: Left;  Lot # [id number] H [place]; 01:22.8 AP%:12.6 CDE: 10.46   CATARACT EXTRACTION W/PHACO Right 08/22/2016   Procedure: CATARACT EXTRACTION PHACO AND INTRAOCULAR LENS PLACEMENT (IOC);  Surgeon: Eulogio Bear, MD;  Location: [facility] ORS;  Service: Ophthalmology;  Laterality: Right;  Lot # [id number] H [place]: 00:52.1 AP%:8.7 CDE: 4.99    CORONARY ANGIOPLASTY     STENT   CORONARY ARTERY BYPASS GRAFT     LOWER EXTREMITY ANGIOGRAPHY Right 01/14/2019   Procedure: LOWER EXTREMITY ANGIOGRAPHY;  Surgeon: Algernon Huxley, MD;  Location: [facility];  Service: Cardiovascular;  Laterality: Right;   MASTECTOMY     TUMOR REMOVAL     ABDOMINAL    Prior to Admission medications   Medication Sig Start Date End Date Taking? Authorizing Provider  amLODipine (NORVASC) 10 MG tablet TAKE 1 TABLET BY MOUTH  DAILY 01/05/21   Minna Merritts, MD  anastrozole (ARIMIDEX) 1 MG tablet Take 1 tablet (1 mg total) by mouth daily. 01/01/21   [name], MD  aspirin EC 81 MG tablet Take 1 tablet (81 mg total) by mouth daily. 01/15/17   [name], DO  atorvastatin (LIPITOR) 40 MG tablet TAKE 1 TABLET BY MOUTH  DAILY Patient taking differently: Take 40 mg by mouth daily. 05/25/20   [name], FNP  blood glucose meter kit and supplies KIT Dispense based on patient and insurance preference. Use up to four times daily as directed. (FOR ICD-9 250.00, 250.01). 01/13/17   [name], DO  Chromium-Cinnamon (CINNAMON PLUS CHROMIUM) [phone] MCG-MG CAPS Take 2 capsules by mouth daily. Patient not taking: Reported on 11/08/2020    [provider]  clopidogrel (PLAVIX) 75 MG tablet TAKE 1 TABLET BY MOUTH  DAILY 01/05/21   Minna Merritts, MD  CORTISPORIN-TC 3.08-17-08-0.5 MG/ML OTIC suspension SMARTSIG:In Ear(s) 01/03/20   [provider]  ferrous sulfate 325 (65 FE) MG EC tablet Take 1 tablet (325 mg total) by mouth 2 (two)  times daily with a meal. 08/17/19   [name], MD  fluticasone (FLONASE) 50 MCG/ACT nasal spray Place 1 spray into both nostrils daily. 07/08/18   [name], FNP  glucose blood (ONE TOUCH TEST STRIPS) test strip Use to test blood sugar twice daily 09/17/17   [name], FNP  hydrALAZINE (APRESOLINE) 10 MG tablet Take 10 mg by mouth 2 (two) times daily. 04/02/19   [provider]  ibuprofen (ADVIL) 100 MG/5ML suspension Take 200 mg by mouth every 8 (eight) hours as needed.    [provider]  insulin degludec (TRESIBA FLEXTOUCH) 200 UNIT/ML FlexTouch Pen Inject 6 Units into the skin. 05/04/20   [provider]  Insulin Pen Needle (BD PEN NEEDLE NANO U/F) 32G X 4 MM MISC USE 1 DAILY 08/03/18   [name], FNP  ipratropium (ATROVENT) 0.03 % nasal spray Place 2 sprays into both nostrils every 12 (  twelve) hours. 12/05/19   Rudene Re, MD  megestrol (MEGACE) 40 MG/ML suspension  10/21/19   [provider]  metoprolol tartrate (LOPRESSOR) 25 MG tablet TAKE 1 TABLET BY MOUTH  TWICE DAILY Patient not taking: Reported on 11/08/2020 03/29/20   [name], FNP  nitroGLYCERIN (NITROSTAT) 0.4 MG SL tablet Place 1 tablet (0.4 mg total) under the tongue every 5 (five) minutes as needed for chest pain. 12/19/17   Minna Merritts, MD  ONE [facility] LANCETS MISC Use as directed 2 times per day 09/17/17   [name], FNP  TOUJEO MAX SOLOSTAR 300 UNIT/ML Solostar Pen SMARTSIG:6 Unit(s) SUB-Q Every Night 09/21/20   [provider]  vitamin B-12 (CYANOCOBALAMIN) 1000 MCG tablet Take 1,000 mcg by mouth daily.    [provider]  vitamin C (ASCORBIC ACID) 250 MG tablet Take 250 mg by mouth daily.    [provider]    Allergies  Allergen Reactions   Amoxicillin Hives and Nausea Only   Gabapentin Other (See Comments)    AMS Confusion   Mirtazapine Itching    Dry mouth with ODT Denies SOB with medication   Other     UNKNOWN  PAIN MEDICATION - unknown reaction    Family History  Problem Relation Age of Onset   Sudden death Mother    Arthritis Father    Stroke Father    Breast cancer Sister    Diabetes Grandchild     Social History Social History   Tobacco Use   Smoking status: Never   Smokeless tobacco: Never  Substance Use Topics   Alcohol use: No   Drug use: No    Review of Systems Constitutional: Negative for fever. Eyes: Left eye pain, chronic but worse over the past several days. Cardiovascular: Negative for chest pain. Respiratory: Negative for shortness of breath. Gastrointestinal: Negative for abdominal pain Musculoskeletal: Negative for musculoskeletal complaints Neurological: Negative for headache All other ROS negative  ____________________________________________   PHYSICAL EXAM:  VITAL SIGNS: ED Triage Vitals  Enc Vitals Group     BP 01/07/21 0940 (!) 157/58     Pulse Rate 01/07/21 0940 74     Resp 01/07/21 0940 16     Temp 01/07/21 0940 98.6 F (37 C)     Temp Source 01/07/21 0940 Oral     SpO2 01/07/21 0940 100 %     Weight 01/07/21 0941 83 lb 15.9 oz (38.1 kg)     Height 01/07/21 0941 5' (1.524 m)     Head Circumference --      Peak Flow --      Pain Score 01/07/21 0940 5     Pain Loc --      Pain Edu? --      Excl. in [place]? --    Constitutional: Alert and oriented. Well appearing and in no distress. Eyes: Normal exam, no conjunctival injection.  No tenderness to eyelid palpation.  No surrounding edema or erythema. ENT      Head: Normocephalic and atraumatic.      Mouth/Throat: Mucous membranes are moist. Cardiovascular: Normal rate, regular rhythm.  Respiratory: Normal respiratory effort without tachypnea nor retractions. Breath sounds are clear  Gastrointestinal: Soft and nontender. No distention.   Musculoskeletal: Nontender with normal range of motion in all extremities.  Left AKA Neurologic:  Normal speech and language. No gross focal neurologic deficits   Skin:  Skin is warm, dry and intact.  Psychiatric: Mood and affect are normal.  ____________________________________________   RADIOLOGY  CT scan of the head is negative  ____________________________________________   INITIAL IMPRESSION / ASSESSMENT AND PLAN / ED COURSE  Pertinent labs & imaging results that were available during my care of the patient were reviewed by me and considered in my medical decision making (see chart for details).   Patient presents emergency department for left eye pain as well as left-sided headache.  States chronic left eye pain but worse over the past several days last night states she could not sleep much due to the discomfort and it was causing her to have a left-sided headache.  Patient states poor vision in the left eye but this is chronic denies any acute change.  Tono-Pen pressure of 14 on examination.  No conjunctival injection.  We will obtain a CT scan of the head given the patient's age.  We will treat with Fioricet x1 tablet in the emergency department and reassess.  Patient has ophthalmology follow-up tomorrow morning.  Overall patient appears quite well.  CT scan of the head is negative.  Patient states she is feeling much better after the Fioricet.  We will discharge the patient with a short course of Fioricet.  Patient has ophthalmology follow-up tomorrow.  Blood pressure remains high after clonidine to 19/77 however patient states she feels well and wants to go home states her blood pressure is always elevated but will come down when she goes home.  Patient will be discharged home.  VANELLOPE PASSMORE was evaluated in Emergency Department on 01/07/2021 for the symptoms described in the history of present illness. She was evaluated in the context of the global COVID-19 pandemic, which necessitated consideration that the patient might be at risk for infection with the SARS-CoV-2 virus that causes COVID-19. Institutional protocols and algorithms that  pertain to the evaluation of patients at risk for COVID-19 are in a state of rapid change based on information released by regulatory bodies including the CDC and federal and state organizations. These policies and algorithms were followed during the patient's care in the ED.  ____________________________________________   FINAL CLINICAL IMPRESSION(S) / ED DIAGNOSES  Headache Left eye pain   Harvest Dark, MD 01/07/21 1303    Harvest Dark, MD 01/07/21 1427

## 2021-01-08 DIAGNOSIS — H34812 Central retinal vein occlusion, left eye, with macular edema: Secondary | ICD-10-CM | POA: Diagnosis not present

## 2021-01-09 DIAGNOSIS — E119 Type 2 diabetes mellitus without complications: Secondary | ICD-10-CM | POA: Diagnosis not present

## 2021-01-09 DIAGNOSIS — R636 Underweight: Secondary | ICD-10-CM | POA: Diagnosis not present

## 2021-01-12 ENCOUNTER — Encounter: Payer: Self-pay | Admitting: Oncology

## 2021-01-12 ENCOUNTER — Inpatient Hospital Stay: Payer: Medicare Other | Attending: Oncology

## 2021-01-12 ENCOUNTER — Inpatient Hospital Stay (HOSPITAL_BASED_OUTPATIENT_CLINIC_OR_DEPARTMENT_OTHER): Payer: Medicare Other | Admitting: Oncology

## 2021-01-12 ENCOUNTER — Other Ambulatory Visit: Payer: Self-pay

## 2021-01-12 VITALS — BP 123/48 | HR 60 | Temp 97.4°F | Resp 18

## 2021-01-12 DIAGNOSIS — Z803 Family history of malignant neoplasm of breast: Secondary | ICD-10-CM | POA: Diagnosis not present

## 2021-01-12 DIAGNOSIS — C50919 Malignant neoplasm of unspecified site of unspecified female breast: Secondary | ICD-10-CM

## 2021-01-12 DIAGNOSIS — N1831 Chronic kidney disease, stage 3a: Secondary | ICD-10-CM | POA: Insufficient documentation

## 2021-01-12 DIAGNOSIS — Z89612 Acquired absence of left leg above knee: Secondary | ICD-10-CM | POA: Diagnosis not present

## 2021-01-12 DIAGNOSIS — M8580 Other specified disorders of bone density and structure, unspecified site: Secondary | ICD-10-CM | POA: Diagnosis not present

## 2021-01-12 DIAGNOSIS — Z79811 Long term (current) use of aromatase inhibitors: Secondary | ICD-10-CM | POA: Diagnosis not present

## 2021-01-12 DIAGNOSIS — D631 Anemia in chronic kidney disease: Secondary | ICD-10-CM | POA: Diagnosis not present

## 2021-01-12 DIAGNOSIS — I129 Hypertensive chronic kidney disease with stage 1 through stage 4 chronic kidney disease, or unspecified chronic kidney disease: Secondary | ICD-10-CM | POA: Insufficient documentation

## 2021-01-12 DIAGNOSIS — I252 Old myocardial infarction: Secondary | ICD-10-CM | POA: Diagnosis not present

## 2021-01-12 DIAGNOSIS — D571 Sickle-cell disease without crisis: Secondary | ICD-10-CM | POA: Insufficient documentation

## 2021-01-12 DIAGNOSIS — E1122 Type 2 diabetes mellitus with diabetic chronic kidney disease: Secondary | ICD-10-CM | POA: Diagnosis not present

## 2021-01-12 LAB — COMPREHENSIVE METABOLIC PANEL
ALT: 21 U/L (ref 0–44)
AST: 27 U/L (ref 15–41)
Albumin: 3 g/dL — ABNORMAL LOW (ref 3.5–5.0)
Alkaline Phosphatase: 85 U/L (ref 38–126)
Anion gap: 7 (ref 5–15)
BUN: 27 mg/dL — ABNORMAL HIGH (ref 8–23)
CO2: 18 mmol/L — ABNORMAL LOW (ref 22–32)
Calcium: 8.4 mg/dL — ABNORMAL LOW (ref 8.9–10.3)
Chloride: 105 mmol/L (ref 98–111)
Creatinine, Ser: 1.2 mg/dL — ABNORMAL HIGH (ref 0.44–1.00)
GFR, Estimated: 43 mL/min — ABNORMAL LOW (ref >60)
Glucose, Bld: 200 mg/dL — ABNORMAL HIGH (ref 70–99)
Potassium: 4.3 mmol/L (ref 3.5–5.1)
Sodium: 130 mmol/L — ABNORMAL LOW (ref 135–145)
Total Bilirubin: 0.2 mg/dL — ABNORMAL LOW (ref 0.3–1.2)
Total Protein: 5.9 g/dL — ABNORMAL LOW (ref 6.5–8.1)

## 2021-01-12 LAB — CBC WITH DIFFERENTIAL/PLATELET
Abs Immature Granulocytes: 0.03 10*3/uL (ref 0.00–0.07)
Basophils Absolute: 0 10*3/uL (ref 0.0–0.1)
Basophils Relative: 1 %
Eosinophils Absolute: 0.1 10*3/uL (ref 0.0–0.5)
Eosinophils Relative: 3 %
HCT: 28 % — ABNORMAL LOW (ref 36.0–46.0)
Hemoglobin: 9.3 g/dL — ABNORMAL LOW (ref 12.0–15.0)
Immature Granulocytes: 1 %
Lymphocytes Relative: 14 %
Lymphs Abs: 0.7 10*3/uL (ref 0.7–4.0)
MCH: 30.6 pg (ref 26.0–34.0)
MCHC: 33.2 g/dL (ref 30.0–36.0)
MCV: 92.1 fL (ref 80.0–100.0)
Monocytes Absolute: 0.4 10*3/uL (ref 0.1–1.0)
Monocytes Relative: 7 %
Neutro Abs: 3.8 10*3/uL (ref 1.7–7.7)
Neutrophils Relative %: 74 %
Platelets: 255 10*3/uL (ref 150–400)
RBC: 3.04 MIL/uL — ABNORMAL LOW (ref 3.87–5.11)
RDW: 13.7 % (ref 11.5–15.5)
WBC: 5.1 10*3/uL (ref 4.0–10.5)
nRBC: 0 % (ref 0.0–0.2)

## 2021-01-12 LAB — IRON AND TIBC
Iron: 33 ug/dL (ref 28–170)
Saturation Ratios: 15 % (ref 10.4–31.8)
TIBC: 220 ug/dL — ABNORMAL LOW (ref 250–450)
UIBC: 187 ug/dL

## 2021-01-12 LAB — RETIC PANEL
Immature Retic Fract: 4.7 % (ref 2.3–15.9)
RBC.: 3.08 MIL/uL — ABNORMAL LOW (ref 3.87–5.11)
Retic Count, Absolute: 42.2 10*3/uL (ref 19.0–186.0)
Retic Ct Pct: 1.4 % (ref 0.4–3.1)
Reticulocyte Hemoglobin: 32.4 pg (ref >27.9)

## 2021-01-12 LAB — TSH: TSH: 2.859 u[IU]/mL (ref 0.350–4.500)

## 2021-01-12 LAB — FERRITIN: Ferritin: 74 ng/mL (ref 11–307)

## 2021-01-13 ENCOUNTER — Encounter: Payer: Self-pay | Admitting: Oncology

## 2021-01-13 DIAGNOSIS — I10 Essential (primary) hypertension: Secondary | ICD-10-CM | POA: Diagnosis not present

## 2021-01-13 NOTE — Progress Notes (Signed)
Hematology/Oncology  Follow up note [facility] Telephone:(336) [phone] Fax:(336) [phone]   Patient Care Team: Lauretta Grill, NP as PCP - General (Nurse Practitioner) Minna Merritts, MD as PCP - Cardiology (Cardiology) Lauretta Grill, NP as Nurse Practitioner (Nurse Practitioner) Jason Coop, NP as Nurse Practitioner (Hospice and Palliative Medicine) [name], MD as Consulting Physician (Oncology)  REFERRING PROVIDER: Lauretta Grill, NP  CHIEF COMPLAINTS/REASON FOR VISIT:  Follow up for breast cancer  HISTORY OF PRESENTING ILLNESS:   Tiffany Bailey is a  87 y.o.  female with PMH listed below was seen in consultation at the request of  Lauretta Grill, NP  for evaluation of abnormal CT scan. Patient was recently evaluated by primary care provider for complaints of urinary retention, headache, constipation. 02/03/2019 x-ray of abdomen showed large colonic stool volume, nodular densities noted in the right mid lung are up). 02/08/2019 subsequent chest x-ray two-view showed hyperinflation of the lungs compatible with COPD.  Multiple nodular densities project over the mid and lower right lung. CT chest was obtained for further evaluation. 02/17/2019 CT chest without contrast Showed multiple bilateral pulmonary nodules,groundglass opacities. Largest solid nodule at the confluence of the right major and minor fissures measuring 1.6 cm.  Most notable groundglass nodule is of the right upper lobe and measure 1.8 x 0.8 cm, small right pleural effusion with associated atelectasis or consolidation.  Larger solid nodule are highly suspicious for metastatic disease. Bulky right mediastinal and hilar lymph node.  Largest pretracheal nodes measuring 3.5 x 3 cm.  There are additional bulky superior mediastinal, supraclavicular, left subpectoral lymph nodes.  Largest subpectoral nodes measuring 3.3 x 2.5 cm.  Patient was sent to cancer center for further  evaluation.  #Patient reports a history of left breast cancer status post mastectomy.  She is a poor historian.  She is not able to recall the timeframe of breast cancer diagnosis and then treatments.  Denies any chemotherapy or radiation treatments.  #Left lower extremity history of amputation, CKD, History of MI, Sickle cell disease, DM, HTN, anemia.   Patient denies shortness of breath, chest pain, hemoptysis.  Night sweating, abdominal pain. Reports intentional weight loss.  Appetite is poor.  Not eating much. Also have wax and wane, chronic intermittent headache, she takes Tylenol as needed, with some relief.  Denies any nausea vomiting. She sees neurology. 11/10/2018 CT head showed no acute intracranial abnormality or significant interval changes.  Stable atrophy and white matter disease.  Atherosclerosis.  #Patient had a consultation visit with me on 02/19/2019.  At that time he was referred for abnormal CT scan.  PET scan was obtained and I recommend patient to obtain left axillary lymph node biopsy.  Patient/family called back and decided not to proceed with any additional work-up.  #February 2020 started Arimidex INTERVAL HISTORY Tiffany Bailey is a 87 y.o. female who has above history reviewed by me today presents for follow up visit for management of metastatic breast cancer. Problems and complaints are listed below: Patient was accompanied by her daughter.  She reports feeling tired.  She did not get CT scan done .   Review of Systems  Constitutional:  Positive for fatigue. Negative for appetite change, chills, fever and unexpected weight change.  HENT:   Negative for hearing loss and voice change.   Eyes:  Negative for eye problems.  Respiratory:  Negative for chest tightness, cough and shortness of breath.   Cardiovascular:  Negative for chest pain.  Gastrointestinal:  Negative  for abdominal distention, abdominal pain, blood in stool and constipation.  Endocrine: Negative for  hot flashes.  Genitourinary:  Negative for difficulty urinating and frequency.   Musculoskeletal:  Negative for arthralgias.       Left lower extremity history of amputation, intermittent sharp pain  Skin:  Negative for itching and rash.  Neurological:  Negative for extremity weakness and headaches.  Hematological:  Negative for adenopathy.  Psychiatric/Behavioral:  Negative for confusion.    MEDICAL HISTORY:  Past Medical History:  Diagnosis Date   Anemia    Anginal pain ([place])    Arthritis    Breast cancer ([place]) 07/19/2019   Cancer ([place])    BREAST   CKD (chronic kidney disease), stage III (HCC)    Coronary artery disease    S/P CABG and multiple PCI's   Diabetes mellitus without complication ([place])    Edema    Failure to thrive in adult    GERD (gastroesophageal reflux disease)    Hb-SS disease without crisis ([place]) 10/23/2017   History of breast cancer    HOH (hard of hearing)    Hyperlipidemia    Hypertension    Myocardial infarction [facility])    Palpitations     SURGICAL HISTORY: Past Surgical History:  Procedure Laterality Date   ABDOMINAL HYSTERECTOMY     ABOVE KNEE LEG AMPUTATION Left 2013   ANTERIOR VITRECTOMY Left 11/16/2015   Procedure: ANTERIOR VITRECTOMY;  Surgeon: Eulogio Bear, MD;  Location: [facility] ORS;  Service: Ophthalmology;  Laterality: Left;   BLADDER SURGERY     CATARACT EXTRACTION W/PHACO Left 11/16/2015   Procedure: CATARACT EXTRACTION PHACO AND INTRAOCULAR LENS PLACEMENT (IOC);  Surgeon: Eulogio Bear, MD;  Location: [facility] ORS;  Service: Ophthalmology;  Laterality: Left;  Lot # [id number] H [place]; 01:22.8 AP%:12.6 CDE: 10.46   CATARACT EXTRACTION W/PHACO Right 08/22/2016   Procedure: CATARACT EXTRACTION PHACO AND INTRAOCULAR LENS PLACEMENT (IOC);  Surgeon: Eulogio Bear, MD;  Location: [facility] ORS;  Service: Ophthalmology;  Laterality: Right;  Lot # [id number] H [place]: 00:52.1 AP%:8.7 CDE: 4.99    CORONARY ANGIOPLASTY     STENT   CORONARY ARTERY BYPASS GRAFT      LOWER EXTREMITY ANGIOGRAPHY Right 01/14/2019   Procedure: LOWER EXTREMITY ANGIOGRAPHY;  Surgeon: Algernon Huxley, MD;  Location: [place] [facility] LAB;  Service: Cardiovascular;  Laterality: Right;   MASTECTOMY     TUMOR REMOVAL     ABDOMINAL    SOCIAL HISTORY: Social History   Socioeconomic History   Marital status: Divorced    Spouse name: Not on file   Number of children: 7   Years of education: Not on file   Highest education level: Not on file  Occupational History   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  Tobacco Use   Smoking status: Never   Smokeless tobacco: Never  Substance and Sexual Activity   Alcohol use: No   Drug use: No   Sexual activity: Not Currently  Other Topics Concern   Not on file  Social History Narrative   Not on file   Social Determinants of Health   Financial Resource Strain: Not on file  Food Insecurity: Not on file  Transportation Needs: Not on file  Physical Activity: Not on file  Stress: Not on file  Social Connections: Not on file  Intimate Partner Violence: Not on file    FAMILY HISTORY: Family History  Problem Relation Age of Onset   Sudden death Mother    Arthritis  Father    Stroke Father    Breast cancer Sister    Diabetes Grandchild     ALLERGIES:  is allergic to amoxicillin, gabapentin, mirtazapine, and other.  MEDICATIONS:  Current Outpatient Medications  Medication Sig Dispense Refill   amLODipine (NORVASC) 10 MG tablet TAKE 1 TABLET BY MOUTH  DAILY 30 tablet 0   anastrozole (ARIMIDEX) 1 MG tablet Take 1 tablet (1 mg total) by mouth daily. 90 tablet 3   aspirin EC 81 MG tablet Take 1 tablet (81 mg total) by mouth daily. 30 tablet 5   atorvastatin (LIPITOR) 40 MG tablet TAKE 1 TABLET BY MOUTH  DAILY (Patient taking differently: Take 40 mg by mouth daily.) 90 tablet 3   blood glucose meter kit and supplies KIT Dispense based on patient and insurance preference. Use up to four times daily as directed. (FOR ICD-9  250.00, 250.01). 1 each 0   butalbital-acetaminophen-caffeine (FIORICET) 50-325-40 MG tablet Take 1-2 tablets by mouth every 6 (six) hours as needed for headache. 12 tablet 0   Chromium-Cinnamon (CINNAMON PLUS CHROMIUM) [phone] MCG-MG CAPS Take 2 capsules by mouth daily.     cloNIDine (CATAPRES) 0.1 MG tablet Take 0.1 mg by mouth 2 (two) times daily.     clopidogrel (PLAVIX) 75 MG tablet TAKE 1 TABLET BY MOUTH  DAILY 30 tablet 0   CORTISPORIN-TC 3.08-17-08-0.5 MG/ML OTIC suspension SMARTSIG:In Ear(s)     ferrous gluconate (FERGON) 324 MG tablet Take 324 mg by mouth 2 (two) times daily.     fluticasone (FLONASE) 50 MCG/ACT nasal spray Place 1 spray into both nostrils daily. 16 g 5   glucose blood (ONE TOUCH TEST STRIPS) test strip Use to test blood sugar twice daily 200 each 3   hydrALAZINE (APRESOLINE) 10 MG tablet Take 10 mg by mouth 2 (two) times daily.     ibuprofen (ADVIL) 100 MG/5ML suspension Take 200 mg by mouth every 8 (eight) hours as needed.     insulin degludec (TRESIBA FLEXTOUCH) 200 UNIT/ML FlexTouch Pen Inject 6 Units into the skin.     Insulin Pen Needle (BD PEN NEEDLE NANO U/F) 32G X 4 MM MISC USE 1 DAILY 100 each 12   ipratropium (ATROVENT) 0.03 % nasal spray Place 2 sprays into both nostrils every 12 (twelve) hours. 30 mL 12   isosorbide dinitrate (ISORDIL) 10 MG tablet Take 10 mg by mouth 2 (two) times daily.     LINZESS 72 MCG capsule Take 72 mcg by mouth every morning.     megestrol (MEGACE) 40 MG/ML suspension      Metoprolol Tartrate 37.5 MG TABS Take 1 tablet by mouth 2 (two) times daily.     nitroGLYCERIN (NITROSTAT) 0.4 MG SL tablet Place 1 tablet (0.4 mg total) under the tongue every 5 (five) minutes as needed for chest pain. 25 tablet 4   ONE TOUCH LANCETS MISC Use as directed 2 times per day 200 each 3   TOUJEO MAX SOLOSTAR 300 UNIT/ML Solostar Pen SMARTSIG:6 Unit(s) SUB-Q Every Night     vitamin B-12 (CYANOCOBALAMIN) 1000 MCG tablet Take 1,000 mcg by mouth daily.      vitamin C (ASCORBIC ACID) 250 MG tablet Take 250 mg by mouth daily.     No current facility-administered medications for this visit.     PHYSICAL EXAMINATION: ECOG PERFORMANCE STATUS: 2 - Symptomatic, <50% confined to bed Vitals:   01/12/21 1201  BP: (!) 123/48  Pulse: 60  Resp: 18  Temp: (!) 97.4 F (36.3 C)  SpO2: 98%   Filed Weights    Physical Exam Constitutional:      General: She is not in acute distress.    Comments: She sits in the wheel chair.  Frail appearance  HENT:     Head: Normocephalic and atraumatic.  Eyes:     General: No scleral icterus.    Pupils: Pupils are equal, round, and reactive to light.  Cardiovascular:     Rate and Rhythm: Normal rate and regular rhythm.     Heart sounds: Normal heart sounds.  Pulmonary:     Effort: Pulmonary effort is normal. No respiratory distress.     Breath sounds: No wheezing.     Comments: Decreased breath sound right lower lobe.  Good air entry on the left base.  Abdominal:     General: Bowel sounds are normal. There is no distension.     Palpations: Abdomen is soft.  Musculoskeletal:        General: No deformity. Normal range of motion.     Cervical back: Normal range of motion and neck supple.     Comments: Left lower extremity history of amputation.   Skin:    General: Skin is warm and dry.     Findings: No erythema or rash.  Neurological:     Mental Status: She is alert and oriented to person, place, and time. Mental status is at baseline.     Cranial Nerves: No cranial nerve deficit.     Coordination: Coordination normal.  Psychiatric:        Mood and Affect: Mood normal.     LABORATORY DATA:  I have reviewed the data as listed Lab Results  Component Value Date   WBC 5.1 01/12/2021   HGB 9.3 (L) 01/12/2021   HCT 28.0 (L) 01/12/2021   MCV 92.1 01/12/2021   PLT 255 01/12/2021   Recent Labs    03/08/20 1255 03/08/20 1255 06/13/20 1255 09/14/20 0957 11/08/20 1458 01/12/21 1133  NA 137   --  135 137 135 130*  K 4.1  --  4.1 4.4 4.0 4.3  CL 108  --  107 105 105 105  CO2 20*  --  17* 21* 19* 18*  GLUCOSE 223*  --  328* 109* 344* 200*  BUN 35*  --  27* 31* 28* 27*  CREATININE 1.41*  --  1.47* 1.25* 1.30* 1.20*  CALCIUM 9.0  --  8.3* 9.0 8.7* 8.4*  GFRNONAA 33*   < > 34* 41* 39* 43*  GFRAA 38*  --   --   --   --   --   PROT 7.1  --  6.1* 6.5  --  5.9*  ALBUMIN 3.7  --  3.2* 3.5  --  3.0*  AST 22  --  26 26  --  27  ALT 18  --  22 20  --  21  ALKPHOS 122  --  107 101  --  85  BILITOT 0.5  --  0.6 0.5  --  0.2*   < > = values in this interval not displayed.    Iron/TIBC/Ferritin/ %Sat    Component Value Date/Time   IRON 33 01/12/2021 1133   TIBC 220 (L) 01/12/2021 1133   FERRITIN 74 01/12/2021 1133   IRONPCTSAT 15 01/12/2021 1133   IRONPCTSAT 27 06/19/2018 1006      RADIOGRAPHIC STUDIES: I have personally reviewed the radiological images as listed and agreed with the findings in the report. CT Head Wo Contrast  Result Date: 01/07/2021 CLINICAL DATA:  New or worsening headache. EXAM: CT HEAD WITHOUT CONTRAST TECHNIQUE: Contiguous axial images were obtained from the base of the skull through the vertex without intravenous contrast. COMPARISON:  Oct 19, 2019 FINDINGS: Brain: No subdural, epidural, or subarachnoid hemorrhage. Ventricles and sulci are stable. Cerebellum, brainstem, and basal cisterns are normal. Scattered white matter changes again identified. No acute cortical ischemia or infarct. No mass effect or midline shift. Vascular: Calcified atherosclerosis in the intracranial carotids. Skull: Normal. Negative for fracture or focal lesion. Sinuses/Orbits: No acute finding. Other: None. IMPRESSION: No acute intracranial abnormalities are identified to explain the patient's headache. Electronically Signed   By: Dorise Bullion III M.D   On: 01/07/2021 12:52   VAS [place] ABI WITH/WO TBI  Result Date: 11/14/2020  LOWER EXTREMITY DOPPLER STUDY Patient Name:  LAYLANIE KRUCZEK   Date of Exam:   11/07/2020 Medical Rec #: [id number]       Accession #:    [id number] Date of Birth: [birth year]       Patient Gender: F Patient Age:   89Y Exam Location:  [facility] Procedure:      VAS [place] ABI WITH/WO TBI Referring Phys: [id number] [place] --------------------------------------------------------------------------------  Indications: Peripheral artery disease, and Left BKA.  Vascular Interventions: 01/14/2019 PTA Rt peron A, tibioperoneal trunk and                         popliteal A. PTA and stent Rt SFA. Comparison Study: 11/12/2019 Performing Technologist: Concha Norway RVT  Examination Guidelines: A complete evaluation includes at minimum, Doppler waveform signals and systolic blood pressure reading at the level of bilateral brachial, anterior tibial, and posterior tibial arteries, when vessel segments are accessible. Bilateral testing is considered an integral part of a complete examination. Photoelectric Plethysmograph (PPG) waveforms and toe systolic pressure readings are included as required and additional duplex testing as needed. Limited examinations for reoccurring indications may be performed as noted.  ABI Findings: +---------+------------------+-----+----------+--------+ Right    Rt Pressure (mmHg)IndexWaveform  Comment  +---------+------------------+-----+----------+--------+ Brachial 193                                       +---------+------------------+-----+----------+--------+ ATA      175               0.91 monophasic         +---------+------------------+-----+----------+--------+ PTA      196               1.02 monophasic         +---------+------------------+-----+----------+--------+ Great Toe139               0.72 Normal             +---------+------------------+-----+----------+--------+ +-------+-----------+-----------+------------+------------+ ABI/TBIToday's ABIToday's TBIPrevious ABIPrevious TBI  +-------+-----------+-----------+------------+------------+ Right  1.02       .72        .83         .56          +-------+-----------+-----------+------------+------------+ Left   BKA                                            +-------+-----------+-----------+------------+------------+ Right ABIs and TBIs appear increased compared to prior study on  11/12/2019.  Summary: Right: Resting right ankle-brachial index is within normal range. No evidence of significant right lower extremity arterial disease. The right toe-brachial index is normal. Left: Left groin duplex added of BKA due to stump pain. Biphasic flow seen in EIA, CFA, Deep profunda, and proximal SFA.  *See table(s) above for measurements and observations.  Electronically signed by Leotis Pain MD on 11/14/2020 at 12:42:27 PM.    Final       ASSESSMENT & PLAN:  1. Metastatic breast cancer ([place])   2. Use of anastrozole (Arimidex)   3. Anemia due to stage 3a chronic kidney disease ([place])   4. Osteopenia, unspecified location    #Metastatic breast cancer Labs are reviewed and discussed with patient. Continue Arimidex 47m daily.  CDK inhibitor was not added given patient's advanced age, performance status, medical problems and patient's preference. Reschedule CT chest abdomen pelvis for evaluation of disease status and treatment response.  Osteopenia,continue  calcium and vitamin D supplementation. Discussed about bisphosphonate and recommend patient to obtain dental clearance  # uncontrolled diabetes.follow up with pcp. # Anemia of CKD, hemoglobin is 9.3.   Iron panel was added today. Ferritin 74, iron saturation 15.  Recommend patient to take oral ferrous sulfate 3239mBID.    Orders Placed This Encounter  Procedures   Ferritin    Standing Status:   Future    Number of Occurrences:   1    Standing Expiration Date:   01/12/2022   Iron and TIBC    Standing Status:   Future    Number of Occurrences:   1    Standing  Expiration Date:   01/12/2022   TSH    Standing Status:   Future    Number of Occurrences:   1    Standing Expiration Date:   01/12/2022   Retic Panel    Standing Status:   Future    Number of Occurrences:   1    Standing Expiration Date:   01/12/2022    All questions were answered. The patient knows to call the clinic with any problems questions or concerns. Return of visit:  3 months.   ZhEarlie ServerMD, PhD Hematology Oncology [facility]- [phone]/30/2022

## 2021-01-20 ENCOUNTER — Encounter: Payer: Self-pay | Admitting: Emergency Medicine

## 2021-01-20 ENCOUNTER — Other Ambulatory Visit: Payer: Self-pay

## 2021-01-20 ENCOUNTER — Emergency Department
Admission: EM | Admit: 2021-01-20 | Discharge: 2021-01-20 | Disposition: A | Discharge status: Home / Self Care | Payer: Medicare Other | Attending: Emergency Medicine | Admitting: Emergency Medicine

## 2021-01-20 DIAGNOSIS — Z5321 Procedure and treatment not carried out due to patient leaving prior to being seen by health care provider: Secondary | ICD-10-CM | POA: Diagnosis not present

## 2021-01-20 DIAGNOSIS — K5900 Constipation, unspecified: Secondary | ICD-10-CM | POA: Diagnosis not present

## 2021-01-20 DIAGNOSIS — R1084 Generalized abdominal pain: Secondary | ICD-10-CM | POA: Diagnosis present

## 2021-01-20 DIAGNOSIS — R519 Headache, unspecified: Secondary | ICD-10-CM | POA: Insufficient documentation

## 2021-01-20 LAB — CBC
HCT: 33.7 % — ABNORMAL LOW (ref 36.0–46.0)
Hemoglobin: 11.1 g/dL — ABNORMAL LOW (ref 12.0–15.0)
MCH: 29.8 pg (ref 26.0–34.0)
MCHC: 32.9 g/dL (ref 30.0–36.0)
MCV: 90.3 fL (ref 80.0–100.0)
Platelets: 331 10*3/uL (ref 150–400)
RBC: 3.73 MIL/uL — ABNORMAL LOW (ref 3.87–5.11)
RDW: 13.2 % (ref 11.5–15.5)
WBC: 5.3 10*3/uL (ref 4.0–10.5)
nRBC: 0 % (ref 0.0–0.2)

## 2021-01-20 LAB — BASIC METABOLIC PANEL
Anion gap: 4 — ABNORMAL LOW (ref 5–15)
BUN: 25 mg/dL — ABNORMAL HIGH (ref 8–23)
CO2: 21 mmol/L — ABNORMAL LOW (ref 22–32)
Calcium: 8.5 mg/dL — ABNORMAL LOW (ref 8.9–10.3)
Chloride: 101 mmol/L (ref 98–111)
Creatinine, Ser: 1.15 mg/dL — ABNORMAL HIGH (ref 0.44–1.00)
GFR, Estimated: 45 mL/min — ABNORMAL LOW (ref >60)
Glucose, Bld: 190 mg/dL — ABNORMAL HIGH (ref 70–99)
Potassium: 4.6 mmol/L (ref 3.5–5.1)
Sodium: 126 mmol/L — ABNORMAL LOW (ref 135–145)

## 2021-01-20 LAB — LIPASE, BLOOD: Lipase: 35 U/L (ref 11–51)

## 2021-01-20 NOTE — ED Triage Notes (Addendum)
Pt via POV from home. Pt c/o constipation for the last week, pt has not had a solid BM for 1 week. Pt c/o generalized abd pain. Denies NVD. Pt states she has been trying OTC laxative with no relief. Pt states her abd is distended and feels tight. Pt also c/o headache that has been going on since yesterday. Pt has a hx of migraine, pt states that this headache is the worse she has ever had. Pt is A&Ox4 and NAD.

## 2021-01-25 ENCOUNTER — Telehealth: Payer: Self-pay

## 2021-01-25 NOTE — Telephone Encounter (Signed)
May sch re- establish care appt  Use follow up slot

## 2021-01-25 NOTE — Telephone Encounter (Signed)
Pt's daughter [name] called and states that pt was being seen by a traveling nurse for the past year. She last saw Joycelyn Schmid on 12/15/19 and wants to know if she will still see her. Please advise.  [phone]

## 2021-02-05 DIAGNOSIS — H34812 Central retinal vein occlusion, left eye, with macular edema: Secondary | ICD-10-CM | POA: Diagnosis not present

## 2021-02-06 DIAGNOSIS — R636 Underweight: Secondary | ICD-10-CM | POA: Diagnosis not present

## 2021-02-06 DIAGNOSIS — E119 Type 2 diabetes mellitus without complications: Secondary | ICD-10-CM | POA: Diagnosis not present

## 2021-02-07 ENCOUNTER — Ambulatory Visit: Payer: Medicare Other | Admitting: Nurse Practitioner

## 2021-02-07 ENCOUNTER — Encounter: Payer: Self-pay | Admitting: Nurse Practitioner

## 2021-02-07 NOTE — Progress Notes (Deleted)
Office Visit    Patient Name: Tiffany Bailey Date of Encounter: 02/07/2021  Primary Care Provider:  Lauretta Grill, NP Primary Cardiologist:  Ida Rogue, MD  Chief Complaint    87 year old female with a history of CAD status post CABG, stage III chronic kidney disease, peripheral arterial disease status post left AKA, GERD, insulin-dependent diabetes, hypertension, hyperlipidemia, arthritis, and breast cancer, who presents for follow-up of CAD.  Past Medical History    Past Medical History:  Diagnosis Date   Anemia    Anginal pain ([place])    Arthritis    Breast cancer ([place]) 07/19/2019   Cancer ([place])    BREAST   CKD (chronic kidney disease), stage III ([place])    Coronary artery disease    a. 2002 CABG x 3 (LIMA-LAD, SVG-D1, SVG-RCA); b. s/p multiple PCI's.   Diabetes mellitus without complication ([place])    Edema    Failure to thrive in adult    GERD (gastroesophageal reflux disease)    Hb-SS disease without crisis ([place]) 10/23/2017   History of breast cancer    HOH (hard of hearing)    Hyperlipidemia    Hypertension    Myocardial infarction [facility])    Palpitations    Past Surgical History:  Procedure Laterality Date   ABDOMINAL HYSTERECTOMY     ABOVE KNEE LEG AMPUTATION Left 2013   ANTERIOR VITRECTOMY Left 11/16/2015   Procedure: ANTERIOR VITRECTOMY;  Surgeon: Eulogio Bear, MD;  Location: [facility] ORS;  Service: Ophthalmology;  Laterality: Left;   BLADDER SURGERY     CATARACT EXTRACTION W/PHACO Left 11/16/2015   Procedure: CATARACT EXTRACTION PHACO AND INTRAOCULAR LENS PLACEMENT (IOC);  Surgeon: Eulogio Bear, MD;  Location: [facility] ORS;  Service: Ophthalmology;  Laterality: Left;  Lot # [id number] H [place]; 01:22.8 AP%:12.6 CDE: 10.46   CATARACT EXTRACTION W/PHACO Right 08/22/2016   Procedure: CATARACT EXTRACTION PHACO AND INTRAOCULAR LENS PLACEMENT (IOC);  Surgeon: Eulogio Bear, MD;  Location: [facility] ORS;  Service: Ophthalmology;  Laterality: Right;  Lot # [id number] H [place]:  00:52.1 AP%:8.7 CDE: 4.99    CORONARY ANGIOPLASTY     STENT   CORONARY ARTERY BYPASS GRAFT     LOWER EXTREMITY ANGIOGRAPHY Right 01/14/2019   Procedure: LOWER EXTREMITY ANGIOGRAPHY;  Surgeon: Algernon Huxley, MD;  Location: [place] [facility];  Service: Cardiovascular;  Laterality: Right;   MASTECTOMY     TUMOR REMOVAL     ABDOMINAL    Allergies  Allergies  Allergen Reactions   Amoxicillin Hives and Nausea Only   Gabapentin Other (See Comments)    AMS Confusion   Mirtazapine Itching    Dry mouth with ODT Denies SOB with medication   Other     UNKNOWN PAIN MEDICATION - unknown reaction    History of Present Illness    87 year old female with the above past medical history including coronary artery disease status post CABG, stage III chronic kidney disease, hypertension, hyperlipidemia, peripheral arterial disease status post left AKA, insulin-dependent diabetes, arthritis, and breast cancer.  As previously noted she underwent CABG x3 in 2002.  Notes indicate that she subsequently underwent several percutaneous interventions.  Most recent echo in July 2019 showed an EF of 55 to 95%, grade 1 diastolic dysfunction, mild to moderate MR and TR, and moderate pulmonary hypertension with a PASP of 44 mmHg.  In July 2020, she was admitted with worsening right-sided peripheral arterial disease and ulceration and underwent PTA.  She has been followed closely by oncology in the setting  of metastatic breast cancer.  Tiffany Bailey was last in cardiology clinic in August 2021, at which time she was doing well.  Home Medications    Current Outpatient Medications  Medication Sig Dispense Refill   amLODipine (NORVASC) 10 MG tablet TAKE 1 TABLET BY MOUTH  DAILY 30 tablet 0   anastrozole (ARIMIDEX) 1 MG tablet Take 1 tablet (1 mg total) by mouth daily. 90 tablet 3   aspirin EC 81 MG tablet Take 1 tablet (81 mg total) by mouth daily. 30 tablet 5   atorvastatin (LIPITOR) 40 MG tablet TAKE 1 TABLET BY  MOUTH  DAILY (Patient taking differently: Take 40 mg by mouth daily.) 90 tablet 3   blood glucose meter kit and supplies KIT Dispense based on patient and insurance preference. Use up to four times daily as directed. (FOR ICD-9 250.00, 250.01). 1 each 0   butalbital-acetaminophen-caffeine (FIORICET) 50-325-40 MG tablet Take 1-2 tablets by mouth every 6 (six) hours as needed for headache. 12 tablet 0   Chromium-Cinnamon (CINNAMON PLUS CHROMIUM) [phone] MCG-MG CAPS Take 2 capsules by mouth daily.     cloNIDine (CATAPRES) 0.1 MG tablet Take 0.1 mg by mouth 2 (two) times daily.     clopidogrel (PLAVIX) 75 MG tablet TAKE 1 TABLET BY MOUTH  DAILY 30 tablet 0   CORTISPORIN-TC 3.08-17-08-0.5 MG/ML OTIC suspension SMARTSIG:In Ear(s)     ferrous gluconate (FERGON) 324 MG tablet Take 324 mg by mouth 2 (two) times daily.     fluticasone (FLONASE) 50 MCG/ACT nasal spray Place 1 spray into both nostrils daily. 16 g 5   glucose blood (ONE TOUCH TEST STRIPS) test strip Use to test blood sugar twice daily 200 each 3   hydrALAZINE (APRESOLINE) 10 MG tablet Take 10 mg by mouth 2 (two) times daily.     ibuprofen (ADVIL) 100 MG/5ML suspension Take 200 mg by mouth every 8 (eight) hours as needed.     insulin degludec (TRESIBA FLEXTOUCH) 200 UNIT/ML FlexTouch Pen Inject 6 Units into the skin.     Insulin Pen Needle (BD PEN NEEDLE NANO U/F) 32G X 4 MM MISC USE 1 DAILY 100 each 12   ipratropium (ATROVENT) 0.03 % nasal spray Place 2 sprays into both nostrils every 12 (twelve) hours. 30 mL 12   isosorbide dinitrate (ISORDIL) 10 MG tablet Take 10 mg by mouth 2 (two) times daily.     LINZESS 72 MCG capsule Take 72 mcg by mouth every morning.     megestrol (MEGACE) 40 MG/ML suspension      Metoprolol Tartrate 37.5 MG TABS Take 1 tablet by mouth 2 (two) times daily.     nitroGLYCERIN (NITROSTAT) 0.4 MG SL tablet Place 1 tablet (0.4 mg total) under the tongue every 5 (five) minutes as needed for chest pain. 25 tablet 4   ONE  TOUCH LANCETS MISC Use as directed 2 times per day 200 each 3   TOUJEO MAX SOLOSTAR 300 UNIT/ML Solostar Pen SMARTSIG:6 Unit(s) SUB-Q Every Night     vitamin B-12 (CYANOCOBALAMIN) 1000 MCG tablet Take 1,000 mcg by mouth daily.     vitamin C (ASCORBIC ACID) 250 MG tablet Take 250 mg by mouth daily.     No current facility-administered medications for this visit.     Review of Systems    ***.  All other systems reviewed and are otherwise negative except as noted above.  Physical Exam    VS:  There were no vitals taken for this visit. , BMI There is  no height or weight on file to calculate BMI.     GEN: Well nourished, well developed, in no acute distress. HEENT: normal. Neck: Supple, no JVD, carotid bruits, or masses. Cardiac: RRR, no murmurs, rubs, or gallops. No clubbing, cyanosis, edema.  Radials/DP/PT 2+ and equal bilaterally.  Respiratory:  Respirations regular and unlabored, clear to auscultation bilaterally. GI: Soft, nontender, nondistended, BS + x 4. MS: no deformity or atrophy. Skin: warm and dry, no rash. Neuro:  Strength and sensation are intact. Psych: Normal affect.  Accessory Clinical Findings    ECG personally reviewed by me today - *** - no acute changes.  Lab Results  Component Value Date   WBC 5.3 01/20/2021   HGB 11.1 (L) 01/20/2021   HCT 33.7 (L) 01/20/2021   MCV 90.3 01/20/2021   PLT 331 01/20/2021   Lab Results  Component Value Date   CREATININE 1.15 (H) 01/20/2021   BUN 25 (H) 01/20/2021   NA 126 (L) 01/20/2021   K 4.6 01/20/2021   CL 101 01/20/2021   CO2 21 (L) 01/20/2021   Lab Results  Component Value Date   ALT 21 01/12/2021   AST 27 01/12/2021   ALKPHOS 85 01/12/2021   BILITOT 0.2 (L) 01/12/2021   Lab Results  Component Value Date   CHOL 136 02/26/2019   HDL 46.50 02/26/2019   LDLCALC 76 02/26/2019   TRIG 63.0 02/26/2019   CHOLHDL 3 02/26/2019    Lab Results  Component Value Date   HGBA1C 8.2 (H) 02/26/2019    Assessment &  Plan    1.  ***   Murray Hodgkins, NP 02/07/2021, 1:17 PM

## 2021-02-08 ENCOUNTER — Encounter: Payer: Self-pay | Admitting: Nurse Practitioner

## 2021-02-13 ENCOUNTER — Encounter: Payer: Self-pay | Admitting: Family

## 2021-02-13 ENCOUNTER — Telehealth: Payer: Self-pay

## 2021-02-13 ENCOUNTER — Ambulatory Visit (INDEPENDENT_AMBULATORY_CARE_PROVIDER_SITE_OTHER): Payer: Medicare Other | Admitting: Family

## 2021-02-13 ENCOUNTER — Ambulatory Visit (INDEPENDENT_AMBULATORY_CARE_PROVIDER_SITE_OTHER): Payer: Medicare Other

## 2021-02-13 ENCOUNTER — Other Ambulatory Visit: Payer: Self-pay

## 2021-02-13 VITALS — BP 128/40 | HR 59 | Ht 60.0 in

## 2021-02-13 DIAGNOSIS — R059 Cough, unspecified: Secondary | ICD-10-CM

## 2021-02-13 DIAGNOSIS — N183 Chronic kidney disease, stage 3 unspecified: Secondary | ICD-10-CM | POA: Diagnosis not present

## 2021-02-13 DIAGNOSIS — I10 Essential (primary) hypertension: Secondary | ICD-10-CM

## 2021-02-13 DIAGNOSIS — I25118 Atherosclerotic heart disease of native coronary artery with other forms of angina pectoris: Secondary | ICD-10-CM | POA: Diagnosis not present

## 2021-02-13 DIAGNOSIS — Z794 Long term (current) use of insulin: Secondary | ICD-10-CM | POA: Diagnosis not present

## 2021-02-13 DIAGNOSIS — E1122 Type 2 diabetes mellitus with diabetic chronic kidney disease: Secondary | ICD-10-CM | POA: Diagnosis not present

## 2021-02-13 DIAGNOSIS — J9811 Atelectasis: Secondary | ICD-10-CM | POA: Diagnosis not present

## 2021-02-13 DIAGNOSIS — E1159 Type 2 diabetes mellitus with other circulatory complications: Secondary | ICD-10-CM | POA: Diagnosis not present

## 2021-02-13 DIAGNOSIS — J90 Pleural effusion, not elsewhere classified: Secondary | ICD-10-CM | POA: Diagnosis not present

## 2021-02-13 LAB — POCT GLYCOSYLATED HEMOGLOBIN (HGB A1C): Hemoglobin A1C: 8.6 % — AB (ref 4.0–5.6)

## 2021-02-13 LAB — BASIC METABOLIC PANEL
BUN: 24 mg/dL — ABNORMAL HIGH (ref 6–23)
CO2: 22 mEq/L (ref 19–32)
Calcium: 9 mg/dL (ref 8.4–10.5)
Chloride: 104 mEq/L (ref 96–112)
Creatinine, Ser: 1.3 mg/dL — ABNORMAL HIGH (ref 0.40–1.20)
GFR: 36.09 mL/min — ABNORMAL LOW (ref >60.00)
Glucose, Bld: 173 mg/dL — ABNORMAL HIGH (ref 70–99)
Potassium: 4.3 mEq/L (ref 3.5–5.1)
Sodium: 134 mEq/L — ABNORMAL LOW (ref 135–145)

## 2021-02-13 MED ORDER — AZELASTINE HCL 0.1 % NA SOLN: 1.0000 | Freq: Two times a day (BID) | NASAL | 4 refills | Status: DC

## 2021-02-13 MED ORDER — TRESIBA FLEXTOUCH 200 UNIT/ML ~~LOC~~ SOPN: 8.0000 [IU] | PEN_INJECTOR | Freq: Every day | SUBCUTANEOUS | 1 refills | Status: DC

## 2021-02-13 NOTE — Assessment & Plan Note (Signed)
Chronic, controlled.  Continue clonidine 0.1 mg twice daily, hydralazine 10 mg twice daily, metoprolol tartrate 37.5 mg twice daily

## 2021-02-13 NOTE — Chronic Care Management (AMB) (Signed)
  Chronic Care Management   Note  02/13/2021 Name: Tiffany Bailey MRN: [id number] DOB: Aug 23, 1927  Tiffany Bailey is a 87 y.o. year old female who is a primary care patient of [name], FNP. I reached out to Tiffany Bailey by phone today in response to a referral sent by Ms. Lilyan Gilford PCP, [name], FNP      [name] was given information about Chronic Care Management services today including:  CCM service includes personalized support from designated clinical staff supervised by her physician, including individualized plan of care and coordination with other care providers 24/7 contact phone numbers for assistance for urgent and routine care needs. Service will only be billed when office clinical staff spend 20 minutes or more in a month to coordinate care. Only one practitioner may furnish and bill the service in a calendar month. The patient may stop CCM services at any time (effective at the end of the month) by phone call to the office staff. The patient will be responsible for cost sharing (co-pay) of up to 20% of the service fee (after annual deductible is met).  Patient's daughter Tiffany Bailey  agreed to services and verbal consent obtained.   Follow up plan: Telephone appointment with care management team member scheduled for:02/22/2021  Noreene Larsson, [address], [place], [place] [address] Direct Dial: [phone] [email or web address]  \

## 2021-02-13 NOTE — Patient Instructions (Addendum)
Continue ferrous sulfate '325mg'$  daily ( this is iron) and start colace over the counter as stool softener.   I have placed referral for chronic care management, in particular diabetes management with our clinical pharmacist here.  Her name is [name] and she will give you a call to arrange this telephone appointment. Have also sent in a new nasal spray called azelastine.  If your cough is from postnasal drip, and this spray will be helpful.  Please ensure that you follow-up in 6 weeks with me

## 2021-02-13 NOTE — Assessment & Plan Note (Addendum)
Lab Results  Component Value Date   HGBA1C 8.6 (A) 02/13/2021  uncontrolled.  I would like to start Jardiance 10 mg in the setting of uncontrolled diabetes and further cardiovascular event risk reduction and heart failure associated risk reduction.  I am also increasing uncomfortable as patient does not check blood sugars routinely and eating habits are erratic with often skipping meals.  Counseled on importance of eating small frequent meals throughout the day and not skipping meals particularly as she is on insulin.  I also advise that she needs to be checking fasting blood sugar daily and when she feels her blood sugar to be low. I sent a note to cardiology, Fabienne Bruns, regarding advise.  Referral as well to clinical pharmacy for advice as relates to improved diabetic control in the setting of CKD.

## 2021-02-13 NOTE — Progress Notes (Signed)
Subjective:    Patient ID: Tiffany Bailey, female    DOB: 15-Nov-1927, 87 y.o.   MRN: [id number]  CC: Tiffany Bailey is a 87 y.o. female who presents today for follow up and to reestablish care.   HPI: Complains of dry cough x 6 months, worsening.  She will cough several times and then will go away for several hours.  Onset with covid 07-2020. She was not hospitalized for covid.  Coughing at night. Endorses PND. No sob, wheezing, fever, congestion, facial pain, epigastric pain or burning, vomiting, nauseated, right leg swelling.   Never smoker. No lung disease.   She is not very active. Endorses fatigue however this has improved.   Compliant with ferrous sulfate 325 mg daily. She feels more constipated since starting.    Echo 2019 normal cardiac function.  Last seen by me 08/2019 Metastatic breast cancer-continues to follow with Dr. Tasia Catchings for palliative care.  Her last office visit 01/12/2021 with Dr. Tasia Catchings, anemia of CKD.  Recommended patient to take ferrous sulfate 325 twice daily . She is compliant with arimidex.  DM-following with endocrinology,Hilary Pasty Arch, however last seen on 02/23/2020.  At that time she had been on Basaglar 6 units daily. A1c 7.9 .  She is currently on tresiba 8 units.  She is not routinely checking her blood sugars. she skips meals and has symptomatic hypoglycemic episodes, she does not have any low blood sugar readings to report however she just can feel that her blood sugar is low.    HTN-compliant with clonidine 0.1 mg twice daily, hydralazine 10 mg twice daily, metoprolol tartrate 37.5 mg twice daily  H/o CAD, CABG- she is not currently taking isosorbide dinitrate.  She was last seen 01/20/2020 by [facility],cardiology advised to continue aspirin, Plavix, statin, beta-blocker and.  Nitroglycerin.  Isosorbide dinitrate was not on medication list at that time.  Continues to follow with Dr. Lucky Cowboy for peripheral arterial disease, last seen 11/07/2020, whom Advised to  follow on annual basis  HISTORY:  Past Medical History:  Diagnosis Date   Anemia    Anginal pain ([place])    Arthritis    Breast cancer ([place]) 07/19/2019   CKD (chronic kidney disease), stage III ([place])    Coronary artery disease    a. 2002 CABG x 3 (LIMA-LAD, SVG-D1, SVG-RCA); b. s/p multiple PCI's.   Diabetes mellitus without complication ([place])    Diastolic dysfunction    a. 12/2017 Echo: EF 55-60%, no rwma, Gr1 DD. Triv AI. Mild-mod MR/TR. PASP 70mHg.   Edema    Failure to thrive in adult    GERD (gastroesophageal reflux disease)    Hb[place]SS disease without crisis ([place] 10/23/2017   History of breast cancer    HOH (hard of hearing)    Hyperlipidemia    Hypertension    Myocardial infarction ([facility]    PAD (peripheral artery disease) ([place]    a. s/p Bailey AKA; b. 12/2018 PTA R peroneal & PTA/Stenting R SFA.   Palpitations    Past Surgical History:  Procedure Laterality Date   ABDOMINAL HYSTERECTOMY     ABOVE KNEE LEG AMPUTATION Left 2013   ANTERIOR VITRECTOMY Left 11/16/2015   Procedure: ANTERIOR VITRECTOMY;  Surgeon: BEulogio Bear MD;  Location: [facility] ORS;  Service: Ophthalmology;  Laterality: Left;   BLADDER SURGERY     CATARACT EXTRACTION W/PHACO Left 11/16/2015   Procedure: CATARACT EXTRACTION PHACO AND INTRAOCULAR LENS PLACEMENT (IOC);  Surgeon: BEulogio Bear MD;  Location: [facility] OR[facility];  Service: Ophthalmology;  Laterality: Left;  Lot # [id number] H [place]; 01:22.8 AP%:12.6 CDE: 10.46   CATARACT EXTRACTION W/PHACO Right 08/22/2016   Procedure: CATARACT EXTRACTION PHACO AND INTRAOCULAR LENS PLACEMENT (IOC);  Surgeon: Eulogio Bear, MD;  Location: [facility] ORS;  Service: Ophthalmology;  Laterality: Right;  Lot # [id number] H [place]: 00:52.1 AP%:8.7 CDE: 4.99    CORONARY ANGIOPLASTY     STENT   CORONARY ARTERY BYPASS GRAFT     LOWER EXTREMITY ANGIOGRAPHY Right 01/14/2019   Procedure: LOWER EXTREMITY ANGIOGRAPHY;  Surgeon: Algernon Huxley, MD;  Location: [facility];  Service:  Cardiovascular;  Laterality: Right;   MASTECTOMY     TUMOR REMOVAL     ABDOMINAL   Family History  Problem Relation Age of Onset   Sudden death Mother    Arthritis Father    Stroke Father    Breast cancer Sister    Diabetes Grandchild     Allergies: Amoxicillin, Gabapentin, Mirtazapine, and Other Current Outpatient Medications on File Prior to Visit  Medication Sig Dispense Refill   amLODipine (NORVASC) 10 MG tablet TAKE 1 TABLET BY MOUTH  DAILY 30 tablet 0   anastrozole (ARIMIDEX) 1 MG tablet Take 1 tablet (1 mg total) by mouth daily. 90 tablet 3   aspirin EC 81 MG tablet Take 1 tablet (81 mg total) by mouth daily. 30 tablet 5   atorvastatin (LIPITOR) 40 MG tablet TAKE 1 TABLET BY MOUTH  DAILY (Patient taking differently: Take 40 mg by mouth daily.) 90 tablet 3   blood glucose meter kit and supplies KIT Dispense based on patient and insurance preference. Use up to four times daily as directed. (FOR ICD-9 250.00, 250.01). 1 each 0   cloNIDine (CATAPRES) 0.1 MG tablet Take 0.1 mg by mouth 2 (two) times daily.     clopidogrel (PLAVIX) 75 MG tablet TAKE 1 TABLET BY MOUTH  DAILY 30 tablet 0   ferrous gluconate (FERGON) 324 MG tablet Take 324 mg by mouth 2 (two) times daily.     fluticasone (FLONASE) 50 MCG/ACT nasal spray Place 1 spray into both nostrils daily. 16 g 5   GARLIC PO Take by mouth.     glucose blood (ONE TOUCH TEST STRIPS) test strip Use to test blood sugar twice daily 200 each 3   hydrALAZINE (APRESOLINE) 10 MG tablet Take 10 mg by mouth 2 (two) times daily.     Insulin Pen Needle (BD PEN NEEDLE NANO U/F) 32G X 4 MM MISC USE 1 DAILY 100 each 12   megestrol (MEGACE) 40 MG/ML suspension      Metoprolol Tartrate 37.5 MG TABS Take 1 tablet by mouth 2 (two) times daily.     ONE TOUCH LANCETS MISC Use as directed 2 times per day 200 each 3   vitamin C (ASCORBIC ACID) 250 MG tablet Take 250 mg by mouth daily.     nitroGLYCERIN (NITROSTAT) 0.4 MG SL tablet Place 1 tablet (0.4 mg  total) under the tongue every 5 (five) minutes as needed for chest pain. (Patient not taking: Reported on 02/13/2021) 25 tablet 4   No current facility-administered medications on file prior to visit.    Social History   Tobacco Use   Smoking status: Never   Smokeless tobacco: Never  Substance Use Topics   Alcohol use: No   Drug use: No    Review of Systems  Constitutional:  Positive for fatigue. Negative for chills and fever.  HENT:  Positive for postnasal drip. Negative for ear  pain and sinus pressure.   Respiratory:  Positive for cough. Negative for shortness of breath.   Cardiovascular:  Negative for chest pain and palpitations.  Gastrointestinal:  Negative for nausea and vomiting.     Objective:    BP (!) 128/40   Pulse (!) 59   Ht 5' (1.524 m)   SpO2 99%   BMI 15.82 kg/m  BP Readings from Last 3 Encounters:  02/13/21 (!) 128/40  01/12/21 (!) 123/48  01/07/21 (!) 219/77   Wt Readings from Last 3 Encounters:  01/07/21 83 lb 15.9 oz (38.1 kg)  11/08/20 84 lb (38.1 kg)  11/07/20 80 lb (36.3 kg)    Physical Exam Vitals reviewed.  Constitutional:      Appearance: She is well-developed.  HENT:     Head: Normocephalic and atraumatic.     Right Ear: Hearing, tympanic membrane, ear canal and external ear normal. No decreased hearing noted. No drainage, swelling or tenderness. No middle ear effusion. No foreign body. Tympanic membrane is not erythematous or bulging.     Left Ear: Hearing, tympanic membrane, ear canal and external ear normal. No decreased hearing noted. No drainage, swelling or tenderness.  No middle ear effusion. No foreign body. Tympanic membrane is not erythematous or bulging.     Nose: Nose normal. No rhinorrhea.     Right Sinus: No maxillary sinus tenderness or frontal sinus tenderness.     Left Sinus: No maxillary sinus tenderness or frontal sinus tenderness.     Mouth/Throat:     Pharynx: Uvula midline. No oropharyngeal exudate or posterior  oropharyngeal erythema.     Tonsils: No tonsillar abscesses.  Eyes:     Conjunctiva/sclera: Conjunctivae normal.  Cardiovascular:     Rate and Rhythm: Regular rhythm.     Pulses: Normal pulses.     Heart sounds: Normal heart sounds.  Pulmonary:     Effort: Pulmonary effort is normal.     Breath sounds: Normal breath sounds. No wheezing, rhonchi or rales.  Lymphadenopathy:     Head:     Right side of head: No submental, submandibular, tonsillar, preauricular, posterior auricular or occipital adenopathy.     Left side of head: No submental, submandibular, tonsillar, preauricular, posterior auricular or occipital adenopathy.     Cervical: No cervical adenopathy.  Skin:    General: Skin is warm and dry.  Neurological:     Mental Status: She is alert.  Psychiatric:        Speech: Speech normal.        Behavior: Behavior normal.        Thought Content: Thought content normal.       Assessment & Plan:   Problem List Items Addressed This Visit       Cardiovascular and Mediastinum   CAD (coronary artery disease), native coronary artery    Chronic, stable.  She continues to follow with cardiology with upcoming appointment next month.  Advised to continue aspirin 36m, Plavix, Lipitor 4102mand metoprolol tartrate 37.5 mg twice daily      Essential hypertension    Chronic, controlled.  Continue clonidine 0.1 mg twice daily, hydralazine 10 mg twice daily, metoprolol tartrate 37.5 mg twice daily        Endocrine   CKD stage 3 due to type 2 diabetes mellitus ([place]   Lab Results  Component Value Date   HGBA1C 8.6 (A) 02/13/2021  uncontrolled.  I would like to start Jardiance 10 mg in the setting of uncontrolled diabetes  and further cardiovascular event risk reduction and heart failure associated risk reduction.  I am also increasing uncomfortable as patient does not check blood sugars routinely and eating habits are erratic with often skipping meals.  Counseled on importance of  eating small frequent meals throughout the day and not skipping meals particularly as she is on insulin.  I also advise that she needs to be checking fasting blood sugar daily and when she feels her blood sugar to be low. I sent a note to cardiology, Fabienne Bruns, regarding advise.  Referral as well to clinical pharmacy for advice as relates to improved diabetic control in the setting of CKD.      Relevant Medications   insulin degludec (TRESIBA FLEXTOUCH) 200 UNIT/ML FlexTouch Pen   Other Relevant Orders   Basic metabolic panel   DM (diabetes mellitus) (HCC) - Primary   Relevant Medications   insulin degludec (TRESIBA FLEXTOUCH) 200 UNIT/ML FlexTouch Pen   Other Relevant Orders   POCT HgB A1C (Completed)   AMB Referral to [facility] Coordinaton     Other   Cough    Onset correlates to COVID-19 disease.  Consider COVID sequela.  We discussed postnasal drip, and I have sent in prescription for azelastine nasal spray.  Pending chest x-ray.  Close follow-up.      Relevant Medications   azelastine (ASTELIN) 0.1 % nasal spray   Other Relevant Orders   DG Chest 2 View     I have discontinued Tiffany Bailey's Cinnamon Plus Chromium, vitamin B-12, ipratropium, Cortisporin-TC, ibuprofen, Toujeo Max SoloStar, butalbital-acetaminophen-caffeine, isosorbide dinitrate, and Linzess. I have also changed her [employer]. Additionally, I am having her start on azelastine. Lastly, I am having her maintain her blood glucose meter kit and supplies, aspirin EC, ONE TOUCH LANCETS, glucose blood, nitroGLYCERIN, fluticasone, Insulin Pen Needle, hydrALAZINE, megestrol, atorvastatin, vitamin C, clopidogrel, amLODipine, anastrozole, Metoprolol Tartrate, cloNIDine, ferrous gluconate, and GARLIC PO.   Meds ordered this encounter  Medications   azelastine (ASTELIN) 0.1 % nasal spray    Sig: Place 1 spray into both nostrils 2 (two) times daily. Use in each nostril as directed    Dispense:  30 mL     Refill:  4    Order Specific Question:   Supervising Provider    Answer:   Tiffany Bailey [[id number]]   insulin degludec (TRESIBA FLEXTOUCH) 200 UNIT/ML FlexTouch Pen    Sig: Inject 8 Units into the skin daily.    Dispense:  3 mL    Refill:  1    Order Specific Question:   Supervising Provider    Answer:   Tiffany Bailey [[id number]]    Return precautions given.   Risks, benefits, and alternatives of the medications and treatment plan prescribed today were discussed, and patient expressed understanding.   Education regarding symptom management and diagnosis given to patient on AVS.  Continue to follow with Tiffany Grill, NP for routine health maintenance.   Tiffany Bailey and I agreed with plan.   [name], FNP   I have spent 40 minutes with a patient including precharting, exam, reviewing medical records, and discussion plan of care.

## 2021-02-13 NOTE — Assessment & Plan Note (Signed)
Chronic, stable.  She continues to follow with cardiology with upcoming appointment next month.  Advised to continue aspirin '81mg'$ , Plavix, Lipitor '40mg'$  and metoprolol tartrate 37.5 mg twice daily

## 2021-02-13 NOTE — Assessment & Plan Note (Signed)
Onset correlates to COVID-19 disease.  Consider COVID sequela.  We discussed postnasal drip, and I have sent in prescription for azelastine nasal spray.  Pending chest x-ray.  Close follow-up.

## 2021-02-14 ENCOUNTER — Telehealth: Payer: Self-pay

## 2021-02-14 ENCOUNTER — Other Ambulatory Visit: Payer: Self-pay | Admitting: Family

## 2021-02-14 DIAGNOSIS — E1122 Type 2 diabetes mellitus with diabetic chronic kidney disease: Secondary | ICD-10-CM

## 2021-02-14 DIAGNOSIS — I10 Essential (primary) hypertension: Secondary | ICD-10-CM | POA: Diagnosis not present

## 2021-02-14 NOTE — Telephone Encounter (Signed)
Received a call from [facility] in [facility] Radiology Concerning Tiffany Bailey's Chest X-Ray. Report is as follows:   Chest Xray - Increase in the nodular changes in the right lung. Likely related to increased metastatic disease. Recommending Ct of the chest with contrast. Full report is on the way.

## 2021-02-14 NOTE — Telephone Encounter (Signed)
Please r/t CT scan to be done in 1-2 weeks and notify pt of new appt details.

## 2021-02-14 NOTE — Telephone Encounter (Signed)
noted 

## 2021-02-14 NOTE — Telephone Encounter (Signed)
-----   Message from [name], MD sent at 02/14/2021 12:30 PM EDT ----- Joycelyn Schmid, thanks for the update.  She supposed to have CT done in July and missed appt.  Benjamine Mola, please move up her CT scan in Oct. Hopefully to get it done in 1-2 weeks.  ----- Message ----- From: [name], FNP Sent: 02/14/2021  11:06 AM EDT To: [name], MD, Earlyne Iba, CMA  Dr Tasia Catchings,   Patient reestablished care with me yesterday and have not seen her in quite some time.  She complained of shortness of breath so ordered baseline chest x-ray.  Concerned regarding increasing nodular changes in the right lung and if related to increasing metastatic disease per chest x-ray.  I see that you have a CT chest and abdomen scheduled in October.  Is her anyway we can have this done sooner?  I can also order CT chest but that makes the most sense.  Please let me your advice.   [name],  Call patient.  Please explain that there is increased changes in the right lung when compared to prior exam and my concern is if it is related to metastatic disease from breast cancer.  I sent a message to Dr. Tasia Catchings as it relates to further imaging.  Please advise her we will call her once have spoken with Dr. Tasia Catchings.  Let me know if cannot reach patient.

## 2021-02-15 ENCOUNTER — Encounter: Payer: Self-pay | Admitting: Oncology

## 2021-02-15 NOTE — Telephone Encounter (Signed)
02/15/2021 Spoke w/ pts daughter and informed her that CT scan has been moved up to 03/01/21 per MD request. I informed her that the pt will need to arrive at the [facility] at 9:15 am and be NPO 4 hrs prior to exam. Daughter confirmed appt change and will relay the info to her mother SRW

## 2021-02-20 ENCOUNTER — Other Ambulatory Visit: Payer: Self-pay

## 2021-02-20 ENCOUNTER — Other Ambulatory Visit: Payer: Medicare Other | Admitting: Primary Care

## 2021-02-21 ENCOUNTER — Telehealth: Payer: Self-pay | Admitting: Family

## 2021-02-21 DIAGNOSIS — E1159 Type 2 diabetes mellitus with other circulatory complications: Secondary | ICD-10-CM

## 2021-02-21 DIAGNOSIS — Z794 Long term (current) use of insulin: Secondary | ICD-10-CM

## 2021-02-21 MED ORDER — EMPAGLIFLOZIN 10 MG PO TABS: 10.0000 mg | ORAL_TABLET | Freq: Every day | ORAL | 1 refills | Status: DC

## 2021-02-21 NOTE — Telephone Encounter (Signed)
-----   Message from [place], PA-C sent at 02/14/2021  3:47 PM EDT ----- Hi Thanks for the update. I don't see any reason Jardiance can't be started from a cardiac perspective.   ----- Message ----- From: [name], FNP Sent: 02/13/2021  12:25 PM EDT To: Cadence Ninfa Meeker, PA-C  Cadence,   I wanted to touch base regarding this mutual patient.  She sees you in a couple weeks.  She is no longer following with endocrinology and reestablish care with me today.  Her diabetes is uncontrolled, A1c 8.6.  She is currently on insulin however she is skipping meals ,having episodes of hypoglycemia and becoming increasingly more concerned about insulin and her safety.  I would like to start Jardiance 10 mg. I want to make sure you thought this was reasonable prior too ... Appreciate your advice. Joycelyn Schmid

## 2021-02-21 NOTE — Telephone Encounter (Signed)
Call pt Consulted with cardiology candence furth regarding starting jardiance '10mg'$  for DM control and prevent of CVD disease.   She is okay with jardiance  I have sent in . Please mail the below to her for her to read regarding the  medication and WHEN to stop it.   She may decrease tresiba from 8 units to 6 units per day as her eating is erratic and worry about lows. Please ask her to bring blood sugar log with her with [facility] at follow up next month. Please change tresiba rx per above.    We discussed starting a new medication called Jardiance today which protects her kidneys, prevents against cardiovascular disease and lowers your hemoglobin A1c.  It is very important in this medication that you read the below.    You should stop taking Jardiance ( SGLT2 inhibitor) and seek medical attention immediately if you have any symptoms of ketoacidosis, a serious condition in which the body produces high levels of blood acids called ketones.    Symptoms of ketoacidosis include nausea, vomiting, abdominal pain, tiredness, and trouble breathing.    You should also be alert for signs and symptoms of a urinary tract infection, such as a feeling of burning when urinating or the need to urinate often or right away; pain in the lower part of the stomach area or pelvis; fever; or blood in the urine.   Lastly, if you develop vomiting or diarrhea, such as with gastroenteritis, you should also hold Jardiance and call the office immediately.  As any volume depletion on this medication can cause ketoacidosis.  Please hold Jardiance and contact me immediately if you are experiencing any of these symptoms above.  Empagliflozin Oral Tablets What is this medication? EMPAGLIFLOZIN (EM pa gli FLOE zin) helps to treat type 2 diabetes. It helps to control blood sugar. Treatment is combined with diet and exercise. This drug may also reduce the risk of heart attack, stroke, or death if you have type 2 diabetes and risk  factors for heart disease. It also treats heart failure. Itmay lower the risk for treatment of heart failure in the hospital. This medicine may be used for other purposes; ask your health care provider orpharmacist if you have questions. COMMON BRAND NAME(S): Jardiance What should I tell my care team before I take this medication? They need to know if you have any of these conditions: dehydration diabetic ketoacidosis diet low in salt eating less due to illness, surgery, dieting, or any other reason having surgery high cholesterol high levels of potassium in the blood history of pancreatitis or pancreas problems history of yeast infection of the penis or vagina if you often drink alcohol infections in the bladder, kidneys, or urinary tract kidney disease liver disease low blood pressure on hemodialysis problems urinating type 1 diabetes uncircumcised female an unusual or allergic reaction to empagliflozin, other medicines, foods, dyes, or preservatives pregnant or trying to get pregnant breast-feeding How should I use this medication? Take this medicine by mouth with water. Take it as directed on the prescription label at the same time every day. You may take it with or without food. Keeptaking it unless your health care provider tells you to stop. A special MedGuide will be given to you by the pharmacist with eachprescription and refill. Be sure to read this information carefully each time. Talk to your health care provider about the use of this medicine in children.Special care may be needed. Overdosage: If you think you have taken too  much of this medicine contact apoison control center or emergency room at once. NOTE: This medicine is only for you. Do not share this medicine with others. What if I miss a dose? If you miss a dose, take it as soon as you can. If it is almost time for yournext dose, take only that dose. Do not take double or extra doses. What may interact with this  medication? alcohol diuretics insulin This list may not describe all possible interactions. Give your health care provider a list of all the medicines, herbs, non-prescription drugs, or dietary supplements you use. Also tell them if you smoke, drink alcohol, or use illegaldrugs. Some items may interact with your medicine. What should I watch for while using this medication? Visit your health care provider for regular checks on your progress. Tell your health care provider if your symptoms do not start to get better or if they getworse. This medicine can cause a serious condition in which there is too much acid in the blood. If you develop nausea, vomiting, stomach pain, unusual tiredness, or breathing problems, stop taking this medicine and call your doctor right away.If possible, use a ketone dipstick to check for ketones in your urine. Check with your health care provider if you have severe diarrhea, nausea, and vomiting, or if you sweat a lot. The loss of too much body fluid may make itdangerous for you to take this medicine. A test called the HbA1C (A1C) will be monitored. This is a simple blood test. It measures your blood sugar control over the last 2 to 3 months. You willreceive this test every 3 to 6 months. Learn how to check your blood sugar. Learn the symptoms of low and high bloodsugar and how to manage them. Always carry a quick-source of sugar with you in case you have symptoms of low blood sugar. Examples include hard sugar candy or glucose tablets. Make sure others know that you can choke if you eat or drink when you develop serious symptoms of low blood sugar, such as seizures or unconsciousness. Get medicalhelp at once. Tell your health care provider if you have high blood sugar. You might need to change the dose of your medicine. If you are sick or exercising more thanusual, you may need to change the dose of your medicine. What side effects may I notice from receiving this  medication? Side effects that you should report to your doctor or health care professionalas soon as possible: allergic reactions (skin rash, itching or hives, swelling of the face, lips, or tongue) breathing problems dizziness feeling faint or lightheaded, falls genital infection (fever; tenderness, redness, or swelling in the genitals or area from the genitals to the back of the rectum) kidney injury (trouble passing urine or change in the amount of urine) low blood sugar (feeling anxious; confusion; dizziness; increased hunger; unusually weak or tired; increased sweating; shakiness; cold, clammy skin; irritable; headache; blurred vision; fast heartbeat; loss of consciousness) muscle weakness nausea, vomiting, unusual stomach upset or pain new pain or tenderness, change in skin color, sores or ulcers, or infection in legs or feet penile discharge, itching, or pain unusual tiredness unusual vaginal discharge, itching, or odor urinary tract infection (fever; chills; a burning feeling when urinating; urgent need to urinate more often; blood in the urine; back pain) Side effects that usually do not require medical attention (report to yourdoctor or health care professional if they continue or are bothersome): mild increase in urination thirsty This list may not describe all  possible side effects. Call your doctor for medical advice about side effects. You may report side effects to FDA at1-800-FDA-1088. Where should I keep my medication? Keep out of the reach of children and pets. Store at room temperature between 20 and 25 degrees C (68 and 77 degrees F).Get rid of any unused medicine after the expiration date. To get rid of medicines that are no longer needed or have expired: Take the medicine to a medicine take-back program. Check with your pharmacy or law enforcement to find a location. If you cannot return the medicine, check the label or package insert to see if the medicine should be  thrown out in the garbage or flushed down the toilet. If you are not sure, ask your health care provider. If it is safe to put it in the trash, take the medicine out of the container. Mix the medicine with cat litter, dirt, coffee grounds, or other unwanted substance. Seal the mixture in a bag or container. Put it in the trash. NOTE: This sheet is a summary. It may not cover all possible information. If you have questions about this medicine, talk to your doctor, pharmacist, orhealth care provider.  2022 Elsevier/Gold Standard (2020-02-04 19:51:17)

## 2021-02-22 ENCOUNTER — Other Ambulatory Visit: Payer: Self-pay

## 2021-02-22 ENCOUNTER — Ambulatory Visit (INDEPENDENT_AMBULATORY_CARE_PROVIDER_SITE_OTHER): Payer: Medicare Other | Admitting: Pharmacist

## 2021-02-22 DIAGNOSIS — E1159 Type 2 diabetes mellitus with other circulatory complications: Secondary | ICD-10-CM

## 2021-02-22 DIAGNOSIS — N183 Chronic kidney disease, stage 3 unspecified: Secondary | ICD-10-CM | POA: Diagnosis not present

## 2021-02-22 DIAGNOSIS — Z794 Long term (current) use of insulin: Secondary | ICD-10-CM | POA: Diagnosis not present

## 2021-02-22 DIAGNOSIS — E1122 Type 2 diabetes mellitus with diabetic chronic kidney disease: Secondary | ICD-10-CM | POA: Diagnosis not present

## 2021-02-22 DIAGNOSIS — I10 Essential (primary) hypertension: Secondary | ICD-10-CM | POA: Diagnosis not present

## 2021-02-22 DIAGNOSIS — C50919 Malignant neoplasm of unspecified site of unspecified female breast: Secondary | ICD-10-CM

## 2021-02-22 DIAGNOSIS — I25118 Atherosclerotic heart disease of native coronary artery with other forms of angina pectoris: Secondary | ICD-10-CM | POA: Diagnosis not present

## 2021-02-22 DIAGNOSIS — Z89612 Acquired absence of left leg above knee: Secondary | ICD-10-CM

## 2021-02-22 MED ORDER — TRESIBA FLEXTOUCH 200 UNIT/ML ~~LOC~~ SOPN: 6.0000 [IU] | PEN_INJECTOR | Freq: Every day | SUBCUTANEOUS | 1 refills | Status: DC

## 2021-02-22 NOTE — Patient Instructions (Addendum)
Tiffany Bailey,   It was great talking with you today! I recommend using a weekly pill box (they make them with 4 time slots!) to best organize your medications. Here is how I would fill it:   Morning: - Amlodipine 10 mg (blood pressure) - Clonidine 0.1 mg (blood pressure) - Hydralazine 20 mg - two 10 mg tablets (blood pressure) - Metoprolol tartrate 37.5 mg (blood pressure) - Anastrazole 1 mg (breast cancer) - Clopidogrel 75 mg (heart attack/stroke prevention) - Atorvastatin 40 mg daily (cholesterol) - Jardiance 10 mg (diabetes)  Late Morning: - Hydralazine 20 mg - two 10 mg tablets (blood pressure)  Afternoon: - Hydralazine 20 mg - two 10 mg tablets (blood pressure)  Evening: - Clonidine 0.1 mg (blood pressure) - Hydralazine 20 mg - two 10 mg tablets (blood pressure) - Metoprolol tartrate 37.5 mg (blood pressure) - Aspirin 81 mg (heart attack/stroke prevention)  Check your blood sugars twice daily:  1) Fasting, first thing in the morning before breakfast and  2) 2 hours after your largest meal.   For a goal A1c of less than 7%, goal fasting readings are less than 130 and goal 2 hour after meal readings are less than 180.   Write down these readings on the attached logs.  Call me with any medication related questions or concerns!  Tiffany Bailey, PharmD [phone]  Visit Information   PATIENT GOALS:   Goals Addressed   None     Consent to CCM Services: Tiffany Bailey was given information about Chronic Care Management services including:  CCM service includes personalized support from designated clinical staff supervised by her physician, including individualized plan of care and coordination with other care providers 24/7 contact phone numbers for assistance for urgent and routine care needs. Service will only be billed when office clinical staff spend 20 minutes or more in a month to coordinate care. Only one practitioner may furnish and bill the service in a calendar  month. The patient may stop CCM services at any time (effective at the end of the month) by phone call to the office staff. The patient will be responsible for cost sharing (co-pay) of up to 20% of the service fee (after annual deductible is met).  Patient agreed to services and verbal consent obtained.   The patient verbalized understanding of instructions, educational materials, and care plan provided today and agreed to receive a mailed copy of patient instructions, educational materials, and care plan.   Plan: Face to Face appointment with care management team member scheduled for: ~ 6 weeks   Tiffany Bailey, PharmD, [place], CPP Clinical Pharmacist [place] [facility] [place]: Patient Care Plan: Medication Management     Problem Identified: Diabetes, HTN, CAD      Long-Range Goal: Disease Progression Prevention   Start Date: 02/22/2021  This Visit's Progress: On track  Priority: High  Note:   Current Barriers:  Unable to achieve control of diabetes   Pharmacist Clinical Goal(s):  Over the next 90 days, patient will achieve control of diabetes as evidenced by A1c  through collaboration with PharmD and provider.   Interventions: 1:1 collaboration with Tiffany Hawthorne, FNP regarding development and update of comprehensive plan of care as evidenced by provider attestation and co-signature Inter-disciplinary care team collaboration (see longitudinal plan of care) Comprehensive medication review performed; medication list updated in electronic medical record  SDOH: Lives with son, Tiffany Bailey. He works during the day, but is home at night. He helps her  manage medications. Daughter, Tiffany Bailey, helps with some aspects of medical care  Discussed referral to RN CM for disease management education. Patient verbalizes understanding Unclear if patient is still followed by Palliative Care. Will outreach Palliative Care team to determine involvement.    Diabetes: Uncontrolled; current treatment: Tresiba 6 units daily, Jardiance 10 mg daily - sent today, has not received yet Current glucose readings: not checking regularly Reports nocturia x 4-5 Current meal patterns: reports very little appetite and daughter worries about patient sleeping throughout the day Agree with minimization and hopefully elimination of insulin therapy moving forward. Agree with initiation of Jardiance for cardiorenal benefits. Counseled on side effects including dehydration. Encouraged adequate hydration.  Recommend sending new script for [name] U100, as [name] U200 can only be dosed in even unit amounts and with patient's low insulin dose, we may need per unit titration instead of per 2 unit titration. Reviewed goal A1c, goal fasting, and goal 2 hour post prandial. Encouraged to add in blood sugar checks, document, and bring to next visit.   Hypertension, CAD in the setting of CKD: Uncontrolled; current treatment: amlodipine 10 mg daily, hydralazine 20 mg four times daily, clonidine 0.1 mg BID, metoprolol tartrate 37.5 mg BID;  Current home readings: reports recent readigs of BP: 142/68; 164/82; 132/72; 123/72; 132/60; 133/68 Denies hypotensive symptoms including dizziness, lightheadedness. Does endorse fatigue that could be related to clonidine (though could also be related to cancer diagnosis).  Recommended to continue current regimen at this time. Reminded of upcoming appointment with cardiology.   Hyperlipidemia and CAD: Appropriately managed; current treatment: atorvastatin 40 mg daily Antiplatelet regimen: aspirin 81 mg daily, clopidogrel 75 mg daily Patient reports she needs an updated script for nitroglycerin. Will collaborate w/ cardiology to provide this Recommended to continue current regimen at this time along with collaboration with cardiology  Metastatic Breast Cancer: Managed by hem/onc; current regimen: anastrazole 1 mg daily Decreased appetite:  megestrol 1 mL BID per patient report, prescribed by Home Visit Provider Lauretta Grill Iron deficiency anemia: ferrous gluconate 324 mg BID - though patient taking daily Will outreach Palliative Care team to see if they are still involved with patient.  Recommend discontinuation of megestrol due to increased risk of thrombotic events and potentially death in older adults with little clinical impact on appetite/weight (per [place]). Will discuss w/ PCP moving forward. Unfortunately, history of allergy to mirtazapine.   Allergies: Appropriately managed; current regimen: azelastine nasal spray Recommended to continue current regimen at this time.   Patient Goals/Self-Care Activities Over the next 90 days, patient will:  - take medications as prescribed focus on medication adherence by using pill box check glucose daily to twice daily, document, and provide at future appointments check blood pressure daily, document, and provide at future appointments  Follow Up Plan: Face to Face appointment with care management team member scheduled for:  ~ 6 weeks

## 2021-02-22 NOTE — Chronic Care Management (AMB) (Signed)
Chronic Care Management Pharmacy Note  02/22/2021 Name:  Tiffany Bailey MRN:  [id number] DOB:  1928/04/01   Subjective: Tiffany Bailey is an 87 y.o. year old female who is a primary patient of [name], FNP.  The CCM team was consulted for assistance with disease management and care coordination needs.    Engaged with patient by telephone for initial visit in response to provider referral for pharmacy case management and/or care coordination services. Her daughter, [name], was also present for the visit  Consent to Services:  The patient was given the following information about Chronic Care Management services today, agreed to services, and gave verbal consent: 1. CCM service includes personalized support from designated clinical staff supervised by the primary care provider, including individualized plan of care and coordination with other care providers 2. 24/7 contact phone numbers for assistance for urgent and routine care needs. 3. Service will only be billed when office clinical staff spend 20 minutes or more in a month to coordinate care. 4. Only one practitioner may furnish and bill the service in a calendar month. 5.The patient may stop CCM services at any time (effective at the end of the month) by phone call to the office staff. 6. The patient will be responsible for cost sharing (co-pay) of up to 20% of the service fee (after annual deductible is met). Patient agreed to services and consent obtained.  Patient Care Team: [name], FNP as PCP - General (Family Medicine) Rockey Situ, [name], MD as PCP - Cardiology (Cardiology) Lauretta Grill, NP as Nurse Practitioner (Nurse Practitioner) Jason Coop, NP as Nurse Practitioner (Hospice and Palliative Medicine) [name], MD as Consulting Physician (Oncology) De Hollingshead, RPH-CPP (Pharmacist)    Objective:  Lab Results  Component Value Date   CREATININE 1.30 (H) 02/13/2021   CREATININE 1.15  (H) 01/20/2021   CREATININE 1.20 (H) 01/12/2021    Lab Results  Component Value Date   HGBA1C 8.6 (A) 02/13/2021   Last diabetic Eye exam:  Lab Results  Component Value Date/Time   HMDIABEYEEXA No Retinopathy 11/18/2018 12:00 AM    Last diabetic Foot exam: No results found for: HMDIABFOOTEX      Component Value Date/Time   CHOL 136 02/26/2019 0828   TRIG 63.0 02/26/2019 0828   HDL 46.50 02/26/2019 0828   CHOLHDL 3 02/26/2019 0828   VLDL 12.6 02/26/2019 0828   LDLCALC 76 02/26/2019 0828    Hepatic Function Latest Ref Rng & Units 01/12/2021 09/14/2020 06/13/2020  Total Protein 6.5 - 8.1 g/dL 5.9(L) 6.5 6.1(L)  Albumin 3.5 - 5.0 g/dL 3.0(L) 3.5 3.2(L)  AST 15 - 41 U/L 27 26 26   ALT 0 - 44 U/L 21 20 22   Alk Phosphatase 38 - 126 U/L 85 101 107  Total Bilirubin 0.3 - 1.2 mg/dL 0.2(L) 0.5 0.6    Lab Results  Component Value Date/Time   TSH 2.859 01/12/2021 11:33 AM   TSH 0.88 12/15/2019 03:44 PM   TSH 1.60 11/20/2017 01:02 PM    CBC Latest Ref Rng & Units 01/20/2021 01/12/2021 11/08/2020  WBC 4.0 - 10.5 K/uL 5.3 5.1 6.3  Hemoglobin 12.0 - 15.0 g/dL 11.1(L) 9.3(L) 11.1(L)  Hematocrit 36.0 - 46.0 % 33.7(L) 28.0(L) 32.2(L)  Platelets 150 - 400 K/uL 331 255 241    No results found for: VD25OH  Clinical ASCVD: Yes  The ASCVD Risk score ([name] DK, et al., 2019) failed to calculate for the following reasons:   The  2019 ASCVD risk score is only valid for ages 48 to 82   The patient has a prior MI or stroke diagnosis     Social History   Tobacco Use  Smoking Status Never  Smokeless Tobacco Never   BP Readings from Last 3 Encounters:  02/13/21 (!) 128/40  01/12/21 (!) 123/48  01/07/21 (!) 219/77   Pulse Readings from Last 3 Encounters:  02/13/21 (!) 59  01/12/21 60  01/07/21 72   Wt Readings from Last 3 Encounters:  01/07/21 83 lb 15.9 oz (38.1 kg)  11/08/20 84 lb (38.1 kg)  11/07/20 80 lb (36.3 kg)    Assessment: Review of patient past medical history,  allergies, medications, health status, including review of consultants reports, laboratory and other test data, was performed as part of comprehensive evaluation and provision of chronic care management services.   SDOH:  (Social Determinants of Health) assessments and interventions performed:  SDOH Interventions    Flowsheet Row Most Recent Value  SDOH Interventions   Financial Strain Interventions Intervention Not Indicated       CCM Care Plan  Allergies  Allergen Reactions   Amoxicillin Hives and Nausea Only   Gabapentin Other (See Comments)    AMS Confusion   Mirtazapine Itching    Dry mouth with ODT Denies SOB with medication   Other     UNKNOWN PAIN MEDICATION - unknown reaction    Medications Reviewed Today     Reviewed by De Hollingshead, RPH-CPP (Pharmacist) on 02/22/21 at 1516  Med List Status: <None>   Medication Order Taking? Sig Documenting Provider Last Dose Status Informant  amLODipine (NORVASC) 10 MG tablet [id number] Yes TAKE 1 TABLET BY MOUTH  DAILY [name], [name], MD Taking Active   anastrozole (ARIMIDEX) 1 MG tablet [id number] Yes Take 1 tablet (1 mg total) by mouth daily. [name], MD Taking Active   aspirin EC 81 MG tablet [id number] Yes Take 1 tablet (81 mg total) by mouth daily. [name], DO Taking Active Self  atorvastatin (LIPITOR) 40 MG tablet [id number] Yes TAKE 1 TABLET BY MOUTH  DAILY [name], FNP Taking Active   azelastine (ASTELIN) 0.1 % nasal spray [id number] Yes Place 1 spray into both nostrils 2 (two) times daily. Use in each nostril as directed [name], FNP Taking Active   blood glucose meter kit and supplies KIT [id number] Yes Dispense based on patient and insurance preference. Use up to four times daily as directed. (FOR ICD-9 250.00, 250.01). [name], DO Taking Active Self  cloNIDine (CATAPRES) 0.1 MG tablet [id number] Yes Take 0.1 mg by mouth 2 (two) times daily. [provider] Taking Active    clopidogrel (PLAVIX) 75 MG tablet [id number] Yes TAKE 1 TABLET BY MOUTH  DAILY [name], [name], MD Taking Active   empagliflozin (JARDIANCE) 10 MG TABS tablet [id number] No Take 1 tablet (10 mg total) by mouth daily before breakfast.  Patient not taking: Reported on 02/22/2021   [name], FNP Not Taking Active   ferrous gluconate (FERGON) 324 MG tablet [id number] Yes Take 324 mg by mouth 2 (two) times daily. [provider] Taking Active   GARLIC PO [id number] Yes Take by mouth. [provider] Taking Active   glucose blood (ONE TOUCH TEST STRIPS) test strip [id number] Yes Use to test blood sugar twice daily [name], FNP Taking Active Self  hydrALAZINE (APRESOLINE) 10 MG tablet [id number] Yes Take 20 mg by mouth 4 (four) times daily.  [provider] Taking Active Self  insulin degludec (TRESIBA FLEXTOUCH) 200 UNIT/ML FlexTouch Pen [id number] Yes Inject 6 Units into the skin daily. [name], FNP Taking Active   Insulin Pen Needle (BD PEN NEEDLE NANO U/F) 32G X 4 MM MISC [id number] Yes USE 1 DAILY [name], FNP Taking Active Self  megestrol (MEGACE) 40 MG/ML suspension [id number] Yes Take 200 mg by mouth daily. [provider] Taking Active Self  Metoprolol Tartrate 37.5 MG TABS [id number] Yes Take 1 tablet by mouth 2 (two) times daily. [provider] Taking Active   nitroGLYCERIN (NITROSTAT) 0.4 MG SL tablet [id number] No Place 1 tablet (0.4 mg total) under the tongue every 5 (five) minutes as needed for chest pain.  Patient not taking: No sig reported   Minna Merritts, MD Not Taking Active   ONE [facility] LANCETS MISC [id number] Yes Use as directed 2 times per day [name], FNP Taking Active Self  vitamin C (ASCORBIC ACID) 250 MG tablet [id number] Yes Take 250 mg by mouth daily. [provider] Taking Active Self            Patient Active Problem List   Diagnosis Date Noted   Cough 02/13/2021    Cancer ([place]) 11/07/2020   Type 2 diabetes mellitus with diabetic neuropathy, unspecified ([place]) 11/02/2020   Fatigue 12/15/2019   Osteopenia 10/25/2019   Use of anastrozole (Arimidex) 08/18/2019   Anemia due to stage 3a chronic kidney disease ([place]) 08/18/2019   Ulcerative nasal mucositis 08/03/2019   Nausea 08/03/2019   Breast cancer ([place]) 07/19/2019   Goals of care, counseling/discussion 07/19/2019   Lung mass 02/10/2019   Constipation 01/27/2019   Headache 01/25/2019   Right leg swelling 01/25/2019   Metastatic breast cancer ([place]) 01/05/2019   Anxiety and depression 12/16/2018   Hx of AKA (above knee amputation), left ([place]) 08/26/2018   Left arm swelling 07/01/2018   Carotid stenosis 12/19/2017   CAD (coronary artery disease) 10/23/2017   Hb-SS disease without crisis ([place]) 10/23/2017   History of CHF (congestive heart failure) 10/23/2017   Old myocardial infarction 10/23/2017   Hand cramp 10/17/2017   Acute non-recurrent maxillary sinusitis 10/17/2017   Skin lesion 09/29/2017   Diarrhea 04/23/2017   Weight loss 09/12/2016   CKD stage 3 due to type 2 diabetes mellitus ([place]) 08/14/2016   PAD (peripheral artery disease) ([place]) 08/14/2016   DM (diabetes mellitus) ([place]) 03/25/2016   Anemia 03/25/2016   Chronic tension-type headache, not intractable 03/25/2016   Essential hypertension 03/25/2016   CAD (coronary artery disease), native coronary artery 03/25/2016   GERD (gastroesophageal reflux disease) 03/25/2016   Hyperlipidemia 03/25/2016   History of breast cancer 03/25/2016   AKI (acute kidney injury) ([place]) 01/28/2016   Hyperkalemia 01/28/2016   Hyponatremia 01/28/2016   Urinary retention 01/28/2016   Hypertensive urgency 01/15/2016   History of nonadherence to medical treatment 09/08/2015   Epigastric pain 08/10/2014   Ischemic heart disease due to coronary artery obstruction ([place]) 01/07/2013    Immunization History  Administered Date(s) Administered   Fluad Quad(high  Dose 65+) 04/12/2019   Influenza Split 04/29/2013   Influenza, High Dose Seasonal PF 03/25/2016, 05/25/2018   Influenza,inj,Quad PF,6+ Mos 08/10/2014   Influenza-Unspecified 08/10/2014   Moderna Sars-Covid-2 Vaccination 07/23/2019, 08/20/2019   Pneumococcal Conjugate-13 09/29/2017   Pneumococcal Polysaccharide-23 03/09/2010, 09/11/2016    Conditions to be addressed/monitored: CAD, HTN, HLD, and DMII  Care Plan : Medication Management  Updates made by De Hollingshead, RPH-CPP since  02/22/2021 12:00 AM     Problem: Diabetes, HTN, CAD      Long-Range Goal: Disease Progression Prevention   Start Date: 02/22/2021  This Visit's Progress: On track  Priority: High  Note:   Current Barriers:  Unable to achieve control of diabetes   Pharmacist Clinical Goal(s):  Over the next 90 days, patient will achieve control of diabetes as evidenced by A1c  through collaboration with PharmD and provider.   Interventions: 1:1 collaboration with [name], FNP regarding development and update of comprehensive plan of care as evidenced by provider attestation and co-signature Inter-disciplinary care team collaboration (see longitudinal plan of care) Comprehensive medication review performed; medication list updated in electronic medical record  SDOH: Lives with son, [name]. He works during the day, but is home at night. He helps her manage medications. Daughter, [name], helps with some aspects of medical care  Discussed referral to RN CM for disease management education. Patient verbalizes understanding Unclear if patient is still followed by Palliative Care. Will outreach Palliative Care team to determine involvement.   Diabetes: Uncontrolled; current treatment: Tresiba 6 units daily, Jardiance 10 mg daily - sent today, has not received yet Current glucose readings: not checking regularly Reports nocturia x 4-5 Current meal patterns: reports very little appetite and daughter worries  about patient sleeping throughout the day Agree with minimization and hopefully elimination of insulin therapy moving forward. Agree with initiation of Jardiance for cardiorenal benefits. Counseled on side effects including dehydration. Encouraged adequate hydration.  Recommend sending new script for [name] U100, as [name] U200 can only be dosed in even unit amounts and with patient's low insulin dose, we may need per unit titration instead of per 2 unit titration. Reviewed goal A1c, goal fasting, and goal 2 hour post prandial. Encouraged to add in blood sugar checks, document, and bring to next visit.   Hypertension, CAD in the setting of CKD: Uncontrolled; current treatment: amlodipine 10 mg daily, hydralazine 20 mg four times daily, clonidine 0.1 mg BID, metoprolol tartrate 37.5 mg BID;  Hx lisinopril, was previously stopped due to renal function Most recent calculated creatinine clearance ~17-20 mL/min; eGFR 36 mL/min Current home readings: reports recent readigs of BP: 142/68; 164/82; 132/72; 123/72; 132/60; 133/68 Denies hypotensive symptoms including dizziness, lightheadedness. Does endorse fatigue that could be related to clonidine (though could also be related to cancer diagnosis).  Recommended to continue current regimen at this time. Reminded of upcoming appointment with cardiology.   Hyperlipidemia and CAD: Appropriately managed; current treatment: atorvastatin 40 mg daily Antiplatelet regimen: aspirin 81 mg daily, clopidogrel 75 mg daily Patient reports she needs an updated script for nitroglycerin. Will collaborate w/ cardiology to provide this Recommended to continue current regimen at this time along with collaboration with cardiology  Metastatic Breast Cancer: Managed by hem/onc; current regimen: anastrazole 1 mg daily Decreased appetite: megestrol 1 mL BID per patient report, prescribed by Home Visit Provider Lauretta Grill Iron deficiency anemia: ferrous gluconate 324 mg BID  - though patient taking daily Will outreach Palliative Care team to see if they are still involved with patient.  Recommend discontinuation of megestrol due to increased risk of thrombotic events and potentially death in older adults with little clinical impact on appetite/weight (per [place]). Will discuss w/ PCP moving forward. Unfortunately, history of allergy to mirtazapine.   Allergies: Appropriately managed; current regimen: azelastine nasal spray Recommended to continue current regimen at this time.   Patient Goals/Self-Care Activities Over the next 90 days, patient will:  -  take medications as prescribed focus on medication adherence by using pill box check glucose daily to twice daily, document, and provide at future appointments check blood pressure daily, document, and provide at future appointments  Follow Up Plan: Face to Face appointment with care management team member scheduled for:  ~ 6 weeks      Medication Assistance: None required.  Patient affirms current coverage meets needs.  Patient's preferred pharmacy is:  [facility]) - [place], [facility] [address] [place] [address] [place] [address] Phone: [phone] Fax: [phone]  [facility]  ([place], [facility] [address] [place] [address] [place] [address] Phone: [phone] Fax: [place], [place] [address] Phone: [phone] Fax: [phone]  [facility] [address] [place]), [place] - [place]) [place] [address] Phone: [phone] Fax: [phone]   Follow Up:  Patient agrees to Care Plan and Follow-up.  Plan: Face to Face appointment with care management team member scheduled for: ~ 6 weeks  [name], PharmD, [place],  [place] Clinical Pharmacist [employer] [facility] [employer] [phone]

## 2021-02-22 NOTE — Telephone Encounter (Signed)
I called and spoke in detail with patient's daughter Tiffany Bailey. I gave her details of changes in medications & advised that she could see the letter I sent with info below there. If not then I would also send through the mail. Pt's daughter verbalized understanding of medication changes.

## 2021-02-25 ENCOUNTER — Telehealth: Payer: Self-pay | Admitting: Family

## 2021-02-25 ENCOUNTER — Other Ambulatory Visit: Payer: Self-pay | Admitting: Family

## 2021-02-25 DIAGNOSIS — Z794 Long term (current) use of insulin: Secondary | ICD-10-CM

## 2021-02-25 DIAGNOSIS — E1159 Type 2 diabetes mellitus with other circulatory complications: Secondary | ICD-10-CM

## 2021-02-25 MED ORDER — TRESIBA FLEXTOUCH 100 UNIT/ML ~~LOC~~ SOPN: 6.0000 [IU] | PEN_INJECTOR | Freq: Every day | SUBCUTANEOUS | 1 refills | Status: DC

## 2021-02-25 NOTE — Telephone Encounter (Signed)
Call pt  Let patient know that I reviewed consult note with our pharmacist, [name] I agree that it would be easier and safer for her to use [place] U100 pen versus Tresiba U200 pen.  I Have sent this to mail order.  Remind her that she is to take to be taking [place] 6 units once per day. Jardiance 10 mg was also sent last week through mail order

## 2021-02-26 NOTE — Telephone Encounter (Signed)
I called & spoke with patient's daughter Tiffany Bailey. She was informed that the Tresiba  100U pewna were sent to [facility] as well as Jardiance 10 mg. Pt's daughter verbalized understanding & aware that she is to be taking 6 units of the [place] daily.

## 2021-02-27 NOTE — Progress Notes (Signed)
Cardiology Office Note:    Date:  03/02/2021   ID:  Tiffany Bailey, DOB [birth year], MRN [id number]  PCP:  [name], FNP  [facility] Cardiologist:  Dr. Arvid [facility] Electrophysiologist:  None   Referring MD: [name], FNP   Chief Complaint: 6 month follow-up  History of Present Illness:    Tiffany Bailey is a 87 y.o. female with a hx of CAD s/p CABG, CKD, GERD, PAD s/p left AKA, DM2 on insulin, HTN, arthritis, metastatic breast cancer who presents for follow-up for CAD.   CAD s/p CABG x3 in 2002 (LIMA-LAD, SVG-D1, SVG-RCA). July 2019 echo showed LVEF 55-60%, normal WMA, G1DD, mild to mod MR, mild to mod TR, PASP 106mHg.   Hospitalized July 2020 for PAD and she was followed by [facility] for worsening right-sided PAD with ulceration. Mild disease noted at the common femoral artery with severe disease of profunda femoris artery beyond the primary branches with poor collateral flow. Underwent angioplasty. She was recommended for medical management, walking regimen, antiplatelet agent. 04/2019 she had repeat studies with ABI 1.5 and TBI 0.7 of LLE.   Last seen 01/20/20 and was stable from a cardiac perspective. No changes were made.   Today, patient reports BP ahs been high. Has been sleeping a lot. Feels throat is dry, sometimes hard ot pass the medication. She takes it with water. BP high today, He took some of her mediaitons today. She takes bp after her meds, 1-2 hours. SBP 120-160/70-80. She was started on clonidine since the last visit. Eats a lot of slat, would recommend low salt diet. Eating better.  They reported that a traveling nurse recently came in and changed hydralazine from 117mBID to 2057mID. According to PCP note 8/30 she was on hydralazine 57m37mD. Appears orders/traveling nurse not in epic.   She Lives with her son. She is mostly sednetary given pain from Left AKA.   Past Medical History:  Diagnosis Date   Anemia    Anginal pain (HCC)[place]  Arthritis    Breast cancer (HCC)[place]/06/2019   CKD (chronic kidney disease), stage III (HCC)[place] Coronary artery disease    a. 2002 CABG x 3 (LIMA-LAD, SVG-D1, SVG-RCA); b. s/p multiple PCI's.   Diabetes mellitus without complication (HCC)[place] Diastolic dysfunction    a. 12/2017 Echo: EF 55-60%, no rwma, Gr1 DD. Triv AI. Mild-mod MR/TR. PASP 44mm54m  Edema    Failure to thrive in adult    GERD (gastroesophageal reflux disease)    Hb-SS disease without crisis (HCC) [place]09/2019   History of breast cancer    HOH (hard of hearing)    Hyperlipidemia    Hypertension    [facility] [place]. s/p L AKA; b. 12/2018 PTA R peroneal & PTA/[place] R SFA.   Palpitations     Past Surgical History:  Procedure Laterality Date   ABDOMINAL HYSTERECTOMY     ABOVE KNEE LEG AMPUTATION Left 2013   ANTERIOR VITRECTOMY Left 11/16/2015   Procedure: ANTERIOR VITRECTOMY;  Surgeon: BradlEulogio Bear  Location: [facility] ORS;  Service: Ophthalmology;  Laterality: Left;   BLADDER SURGERY     CATARACT EXTRACTION W/PHACO Left 11/16/2015   Procedure: CATARACT EXTRACTION PHACO AND INTRAOCULAR LENS PLACEMENT (IOC);  Surgeon: BradlEulogio Bear  Location: [facility] ORS;  Service: Ophthalmology;  Laterality: Left;  Lot # [id number]; 0[place]22.8  AP%:12.6 CDE: 10.46   CATARACT EXTRACTION W/PHACO Right 08/22/2016   Procedure: CATARACT EXTRACTION PHACO AND INTRAOCULAR LENS PLACEMENT (IOC);  Surgeon: Eulogio Bear, MD;  Location: [facility] ORS;  Service: Ophthalmology;  Laterality: Right;  Lot # [id number] H [place]: 00:52.1 AP%:8.7 CDE: 4.99    CORONARY ANGIOPLASTY     STENT   CORONARY ARTERY BYPASS GRAFT     LOWER EXTREMITY ANGIOGRAPHY Right 01/14/2019   Procedure: LOWER EXTREMITY ANGIOGRAPHY;  Surgeon: Algernon Huxley, MD;  Location: [facility];  Service: Cardiovascular;  Laterality: Right;   MASTECTOMY     TUMOR REMOVAL     ABDOMINAL    Current Medications: Current Meds   Medication Sig   amLODipine (NORVASC) 10 MG tablet TAKE 1 TABLET BY MOUTH  DAILY   anastrozole (ARIMIDEX) 1 MG tablet Take 1 tablet (1 mg total) by mouth daily.   aspirin EC 81 MG tablet Take 1 tablet (81 mg total) by mouth daily.   atorvastatin (LIPITOR) 40 MG tablet TAKE 1 TABLET BY MOUTH  DAILY   azelastine (ASTELIN) 0.1 % nasal spray Place 1 spray into both nostrils 2 (two) times daily. Use in each nostril as directed   blood glucose meter kit and supplies KIT Dispense based on patient and insurance preference. Use up to four times daily as directed. (FOR ICD-9 250.00, 250.01).   cloNIDine (CATAPRES) 0.1 MG tablet Take 0.1 mg by mouth 2 (two) times daily.   clopidogrel (PLAVIX) 75 MG tablet TAKE 1 TABLET BY MOUTH  DAILY   empagliflozin (JARDIANCE) 10 MG TABS tablet Take 1 tablet (10 mg total) by mouth daily before breakfast.   ferrous gluconate (FERGON) 324 MG tablet Take 324 mg by mouth 2 (two) times daily.   GARLIC PO Take by mouth.   glucose blood (ONE TOUCH TEST STRIPS) test strip Use to test blood sugar twice daily   insulin degludec (TRESIBA FLEXTOUCH) 100 UNIT/ML FlexTouch Pen Inject 6 Units into the skin daily.   Insulin Pen Needle (BD PEN NEEDLE NANO U/F) 32G X 4 MM MISC USE 1 DAILY   megestrol (MEGACE) 40 MG/ML suspension Take 200 mg by mouth daily.   Metoprolol Tartrate 37.5 MG TABS Take 1 tablet by mouth 2 (two) times daily.   nitroGLYCERIN (NITROSTAT) 0.4 MG SL tablet Place 1 tablet (0.4 mg total) under the tongue every 5 (five) minutes as needed for chest pain.   ONE TOUCH LANCETS MISC Use as directed 2 times per day   vitamin C (ASCORBIC ACID) 250 MG tablet Take 250 mg by mouth daily.   [DISCONTINUED] hydrALAZINE (APRESOLINE) 10 MG tablet Take 10 mg by mouth in the morning and at bedtime.     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  Tobacco Use   Smoking status: Never   Smokeless tobacco: Never  Substance and Sexual Activity   Alcohol use: No   Drug use: No   Sexual activity: Not Currently  Other Topics Concern   Not on file  Social History Narrative   Not on file   Social Determinants of Health   Financial Resource Strain: Low Risk    Difficulty of Paying  Living Expenses: Not hard at all  Food Insecurity: Not on file  Transportation Needs: Not on file  Physical Activity: Not on file  Stress: Not on file  Social Connections: Not on file     Family History: The patient's family history includes Arthritis in her father; Breast cancer in her sister; Diabetes in her grandchild; Stroke in her father; Sudden death in her mother.  ROS:   Please see the history of present illness.     All other systems reviewed and are negative.  EKGs/Labs/Other Studies Reviewed:    The following studies were reviewed today:  Echo 2019 Study Conclusions   - Left ventricle: The cavity size was normal. Systolic function was    normal. The estimated ejection fraction was in the range of 55%    to 60%. Wall motion was normal; there were no regional wall    motion abnormalities. Doppler parameters are consistent with    abnormal left ventricular relaxation (grade 1 diastolic    dysfunction).  - Aortic valve: There was trivial regurgitation.  - Mitral valve: There was mild to moderate regurgitation.  - Left atrium: The atrium was normal in size.  - Right ventricle: Systolic function was normal.  - Tricuspid valve: There was mild-moderate regurgitation.  - Pulmonary arteries: Systolic pressure was mildly elevated. PA    peak pressure: 44 mm Hg (S).   Impressions:   - Aortic valve sclerosis without significant stenosis.   EKG:  EKG is  ordered today.  The ekg  ordered today demonstrates NSR, 69bpm, TWI inferior leads, LVH, no significant changes  Recent Labs: 01/12/2021: ALT 21; TSH 2.859 01/20/2021: Hemoglobin 11.1; Platelets 331 02/13/2021: BUN 24; Creatinine, Ser 1.30; Potassium 4.3; Sodium 134  Recent Lipid Panel    Component Value Date/Time   CHOL 136 02/26/2019 0828   TRIG 63.0 02/26/2019 0828   HDL 46.50 02/26/2019 0828   CHOLHDL 3 02/26/2019 0828   VLDL 12.6 02/26/2019 0828   LDLCALC 76 02/26/2019 0828     Physical Exam:    VS:  BP (!) 150/54 (BP Location: Left Arm, Patient Position: Sitting, Cuff Size: Normal)   Pulse 69   Ht 5' (1.524 m)   SpO2 99%   BMI 15.82 kg/m     Wt Readings from Last 3 Encounters:  01/07/21 83 lb 15.9 oz (38.1 kg)  11/08/20 84 lb (38.1 kg)  11/07/20 80 lb (36.3 kg)     GEN:  Well nourished, well developed in no acute distress HEENT: Normal NECK: No JVD; No carotid bruits LYMPHATICS: No lymphadenopathy CARDIAC: RRR, no murmurs, rubs, gallops RESPIRATORY:  Clear to auscultation without rales, wheezing or rhonchi  ABDOMEN: Soft, non-tender, non-distended MUSCULOSKELETAL:  No edema; left AKA SKIN: Warm and dry NEUROLOGIC:  Alert and oriented x 3 PSYCHIATRIC:  Normal affect   ASSESSMENT:    1. Coronary artery disease of native artery of native heart with stable angina pectoris ([place])   2. Essential hypertension   3. Mixed hyperlipidemia   4. Hyperlipidemia LDL goal <70   5. PAD (peripheral artery disease) ([place])   6. CKD stage 3 due to type 2 diabetes mellitus ([place])   7. Hx of AKA (above knee amputation), left ([place])   8. Type 2 diabetes mellitus with other circulatory complication, with long-term current use of insulin (HCC)    PLAN:    In order of problems listed above:  CAD s/p CABG x3 Patient denies anginal symptoms, however she is sedentary at baseline given L  BKA. EKG with no changes. No indication for ischemic testing at this time. Continue Aspirin and plavix with CAD/PAD. Continue  statin and BB.   PAD s/p Left AKA No signs of worsening claudication however patient mostly sedentary. Continue secondary prevention with Aspirin, statin, plavix.   HTN BP today 150/54. There was much confusion regarding medications. Apparently a traveling nurse changed hydralazine form 65m BID to 264mQID. Reports she is feeling very fatigued. At her PCP visit 8/30 notes report hydralazine 1041mD. Given confusion I will go back down to 89m13mD of hydralazine. Check bps for a week and call in. Continue clonidine 0.1mgB27m amlodipine 89mg 51my metoprolol 37.5,g daily. We will see her back in a month for bp and medication check. Can up titrate hydralazine if needed.   HLD LDL 76 9/202. Will check lipid panel today. Continue Lipitor 40mg d41m.  Anemia of CKD Hgb stable by labs in 01/2021.   DM2 A1C 8.6 02/13/21. Patient is making diet changes. Followed by PCP.  Disposition: Follow up in 1 month(s) with MD/APP    Signed, Ronnisha Felber H FurthNinfa Meeker 03/02/2021 2:13 PM    [facility]

## 2021-03-01 ENCOUNTER — Ambulatory Visit
Admission: RE | Admit: 2021-03-01 | Discharge: 2021-03-01 | Disposition: A | Discharge status: Home / Self Care | Payer: Medicare Other | Source: Ambulatory Visit | Attending: Oncology | Admitting: Oncology

## 2021-03-01 ENCOUNTER — Other Ambulatory Visit: Payer: Self-pay

## 2021-03-01 DIAGNOSIS — J90 Pleural effusion, not elsewhere classified: Secondary | ICD-10-CM | POA: Insufficient documentation

## 2021-03-01 DIAGNOSIS — C7800 Secondary malignant neoplasm of unspecified lung: Secondary | ICD-10-CM | POA: Diagnosis not present

## 2021-03-01 DIAGNOSIS — K429 Umbilical hernia without obstruction or gangrene: Secondary | ICD-10-CM | POA: Diagnosis not present

## 2021-03-01 DIAGNOSIS — I700 Atherosclerosis of aorta: Secondary | ICD-10-CM | POA: Insufficient documentation

## 2021-03-01 DIAGNOSIS — C50919 Malignant neoplasm of unspecified site of unspecified female breast: Secondary | ICD-10-CM | POA: Diagnosis not present

## 2021-03-01 DIAGNOSIS — M4856XA Collapsed vertebra, not elsewhere classified, lumbar region, initial encounter for fracture: Secondary | ICD-10-CM | POA: Diagnosis not present

## 2021-03-01 DIAGNOSIS — Z853 Personal history of malignant neoplasm of breast: Secondary | ICD-10-CM | POA: Diagnosis not present

## 2021-03-01 DIAGNOSIS — I2510 Atherosclerotic heart disease of native coronary artery without angina pectoris: Secondary | ICD-10-CM | POA: Diagnosis not present

## 2021-03-01 DIAGNOSIS — N200 Calculus of kidney: Secondary | ICD-10-CM | POA: Diagnosis not present

## 2021-03-02 ENCOUNTER — Encounter: Payer: Self-pay | Admitting: Medical

## 2021-03-02 ENCOUNTER — Ambulatory Visit (INDEPENDENT_AMBULATORY_CARE_PROVIDER_SITE_OTHER): Payer: Medicare Other | Admitting: Medical

## 2021-03-02 VITALS — BP 150/54 | HR 69 | Ht 60.0 in

## 2021-03-02 DIAGNOSIS — E782 Mixed hyperlipidemia: Secondary | ICD-10-CM | POA: Diagnosis not present

## 2021-03-02 DIAGNOSIS — I10 Essential (primary) hypertension: Secondary | ICD-10-CM | POA: Diagnosis not present

## 2021-03-02 DIAGNOSIS — I739 Peripheral vascular disease, unspecified: Secondary | ICD-10-CM | POA: Diagnosis not present

## 2021-03-02 DIAGNOSIS — Z794 Long term (current) use of insulin: Secondary | ICD-10-CM

## 2021-03-02 DIAGNOSIS — I25118 Atherosclerotic heart disease of native coronary artery with other forms of angina pectoris: Secondary | ICD-10-CM

## 2021-03-02 DIAGNOSIS — Z89612 Acquired absence of left leg above knee: Secondary | ICD-10-CM

## 2021-03-02 DIAGNOSIS — E1122 Type 2 diabetes mellitus with diabetic chronic kidney disease: Secondary | ICD-10-CM

## 2021-03-02 DIAGNOSIS — E1159 Type 2 diabetes mellitus with other circulatory complications: Secondary | ICD-10-CM

## 2021-03-02 DIAGNOSIS — N183 Chronic kidney disease, stage 3 unspecified: Secondary | ICD-10-CM | POA: Diagnosis not present

## 2021-03-02 DIAGNOSIS — E785 Hyperlipidemia, unspecified: Secondary | ICD-10-CM | POA: Diagnosis not present

## 2021-03-02 MED ORDER — HYDRALAZINE HCL 10 MG PO TABS: 10.0000 mg | ORAL_TABLET | Freq: Two times a day (BID) | ORAL | 3 refills | Status: AC

## 2021-03-02 NOTE — Patient Instructions (Signed)
Medication Instructions:   Please decrease you Hydralazine Hydralazine 10 mg TWICE a day  *If you need a refill on your cardiac medications before your next appointment, please call your pharmacy*  Lab Work: Lipis LABS WILL APPEAR ON MYCHART, ABNORMAL RESULTS WILL BE CALLED  Testing/Procedures: None  Follow-Up: At [facility], you and your health needs are our priority.  As part of our continuing mission to provide you with exceptional heart care, we have created designated Provider Care Teams.  These Care Teams include your primary Cardiologist (physician) and Advanced Practice Providers (APPs -  Physician Assistants and Nurse Practitioners) who all work together to provide you with the care you need, when you need it.  Your next appointment:   1 month(s)  The format for your next appointment:   In Person  Provider:   Cadence Kathlen Mody, PA-C   Other Instructions Please monitor blood pressures and keep a log of your readings.  Call the clinic in 1 week with BP readings.   How to use a home blood pressure monitor. Be still. Measure at the same time every day. It's important to take the readings at the same time each day, such as morning and evening. Take reading approximately 1 1/2 to 2 hours after BP medications.   AVOID these things for 30 minutes before checking your blood pressure: Drinking caffeine. Drinking alcohol. Eating. Smoking. Exercising.

## 2021-03-06 ENCOUNTER — Telehealth: Payer: Self-pay | Admitting: Family

## 2021-03-06 NOTE — Telephone Encounter (Signed)
-----   Message from [name], MD sent at 02/25/2021 10:09 AM EDT ----- Ok to discontinue Megace.   Thanks.  ----- Message ----- From: [name], FNP Sent: 02/25/2021   7:46 AM EDT To: [name], MD  Dr Tasia Catchings,   [facility] you are well.  We have a lovely pharmacist in our office who has been very helpful in reviewing medications interactions etc. with patients.    She had a consult with her mutual patient prescribed last week.  Per her note ( copied below) she mentioned the below regarding megestrol.    My question to you is if you would like to continue megestrol.  I presume you started although not exactly sure.  If you agree in discontinuing, I can let her know. Otherwise, if you would like to continue, I would defer that to you.  Odeth Bry  Metastatic Breast Cancer:  Managed by hem/onc; current regimen: anastrazole 1 mg daily  Decreased appetite: megestrol 1 mL BID per patient report, prescribed by Home Visit Provider Lauretta Grill  Iron deficiency anemia: ferrous gluconate 324 mg BID - though patient taking daily  Will outreach Palliative Care team to see if they are still involved with patient.   Recommend discontinuation of megestrol due to increased risk of thrombotic events and potentially death in older adults with little clinical impact on appetite/weight (per [place]). Will discuss w/ PCP moving forward. Unfortunately, history of allergy to mirtazapine.

## 2021-03-06 NOTE — Telephone Encounter (Signed)
Call patient Please advise her that I consulted with pharmacy and oncologist, Dr. Tasia Catchings we jointly agreed that she no longer require the Megace.  There side effects associated with this medication.  She may stop this medication   Note:   Recommend discontinuation of megestrol due to increased risk of thrombotic events and potentially death in older adults with little clinical impact on appetite/weight (per [place]).

## 2021-03-07 ENCOUNTER — Other Ambulatory Visit: Payer: Medicare Other | Admitting: Primary Care

## 2021-03-07 ENCOUNTER — Other Ambulatory Visit: Payer: Self-pay

## 2021-03-07 DIAGNOSIS — Z89612 Acquired absence of left leg above knee: Secondary | ICD-10-CM | POA: Diagnosis not present

## 2021-03-07 DIAGNOSIS — Z515 Encounter for palliative care: Secondary | ICD-10-CM

## 2021-03-07 DIAGNOSIS — E1140 Type 2 diabetes mellitus with diabetic neuropathy, unspecified: Secondary | ICD-10-CM | POA: Diagnosis not present

## 2021-03-07 DIAGNOSIS — C50919 Malignant neoplasm of unspecified site of unspecified female breast: Secondary | ICD-10-CM | POA: Diagnosis not present

## 2021-03-07 DIAGNOSIS — N1831 Chronic kidney disease, stage 3a: Secondary | ICD-10-CM | POA: Diagnosis not present

## 2021-03-07 NOTE — Progress Notes (Signed)
Tiffany Bailey: [phone]  Fax: [phone]    Date of encounter: 03/07/21 12:40 PM PATIENT NAME: Tiffany Bailey [place] [address]   [phone] (home)  DOB: 1928/05/25 MRN: [id number] PRIMARY CARE PROVIDER:    [name], FNP,  [address] [place] [address] [phone]  REFERRING PROVIDER:   [name], FNP [address] [place],  [place] [address] [phone]  RESPONSIBLE PARTY:    Contact Information     Name Relation Home Work Mobile   Ratliff,[name] F Daughter [phone]   virgene, tirone   [phone]   Dixon Boos Daughter   [phone]   Annamary Carolin Daughter   [phone]        I met face to face with patient and family in  home. Palliative Care was asked to follow this patient by consultation request of  [name], [name], FNP to address advance care planning and complex medical decision making. This is a follow up visit.                                   ASSESSMENT AND PLAN / RECOMMENDATIONS:   Advance Care Planning/Goals of Care: Goals include to maximize quality of life and symptom management. Our advance care planning conversation included a discussion about:     Exploration of personal, cultural or spiritual beliefs that might influence medical decisions   Identification of a healthcare agent - [name] Review of an  advance directive document . CODE STATUS: FULL CODE Patient has yet to f/u for CT of chest for assessment of cancer progress. Will revisit ACP in light of any new findings.  Symptom Management/Plan:  Mood: Very happy from having a large birthday party this past week. She was well feted and she feels blessed. She states she has 7 children who all look after her.  Medication management: Has some meds in her home not on her list, ie linzess. She has had some constipation so we  discussed her taking linzess and other otc if needed. She also has meds in several different places in her home. Suggested centralizing medications and perhaps considering drug packing service.  Diet: Endorses eating well. States she has not tested her blood sugar lately but endorses starting on jardiance.  Megace no longer needed and is not in the home.   Glucose management: Not checking much, states she will start again. Last A1 C = 8.6% Denies any side effects on jardiance but does complain of dry mouth.  Follow up Palliative Care Visit: Palliative care will continue to follow for complex medical decision making, advance care planning, and clarification of goals. Return 6-8 weeks or prn.  I spent 60 minutes providing this consultation. More than 50% of the time in this consultation was spent in counseling and care coordination.  PPS: 40%  HOSPICE ELIGIBILITY/DIAGNOSIS: TBD  Chief Complaint: debility, immobility, self care deficits for med management  HISTORY OF PRESENT ILLNESS:  [name] is a 87 y.o. year old female  with h/o metastatic breast cancer and Rx, DM, PAD, L amputation.   History obtained from review of EMR, discussion with primary team, and interview with family, facility staff/caregiver and/or [name].  I reviewed available labs, medications, imaging, studies and related documents from the EMR.  Records reviewed and summarized above.   ROS  General: NAD  EYES: denies vision changes ENMT: denies dysphagia Cardiovascular: denies chest pain, denies DOE Pulmonary: denies cough, denies increased SOB Abdomen: endorses good appetite, endorses constipation, endorses continence of bowel GU: denies dysuria, endorses continence of urine MSK:  endorses weakness,  no falls reported Skin: denies rashes or wounds Neurological: denies pain, denies insomnia Psych: Endorses positive mood Heme/lymph/immuno: denies bruises, abnormal bleeding  Physical Exam: Current and past  weights: unavailable Constitutional: NAD General: frail appearing, thin EYES: anicteric sclera, lids intact, no discharge  ENMT: hard of  hearing, oral mucous membranes moist CV: S1S2, RRR, no LE edema  Pulmonary: LCTA, no increased work of breathing, no cough, room air Abdomen: intake 75%, normo-active BS + 4 quadrants, soft and non tender, no ascites GU: deferred MSK:  severe sarcopenia, moves all extremities,  L AKA, non ambulatory Skin: warm and dry, no rashes or wounds on visible skin Neuro:  +generalized weakness,  mild to moderate cognitive impairment Psych: non-anxious affect, A and O x 2-3 Hem/lymph/immuno: no widespread bruising  Outpatient Encounter Medications as of 03/07/2021  Medication Sig   empagliflozin (JARDIANCE) 10 MG TABS tablet Take 1 tablet (10 mg total) by mouth daily before breakfast.   amLODipine (NORVASC) 10 MG tablet TAKE 1 TABLET BY MOUTH  DAILY   anastrozole (ARIMIDEX) 1 MG tablet Take 1 tablet (1 mg total) by mouth daily.   aspirin EC 81 MG tablet Take 1 tablet (81 mg total) by mouth daily.   atorvastatin (LIPITOR) 40 MG tablet TAKE 1 TABLET BY MOUTH  DAILY   azelastine (ASTELIN) 0.1 % nasal spray Place 1 spray into both nostrils 2 (two) times daily. Use in each nostril as directed   blood glucose meter kit and supplies KIT Dispense based on patient and insurance preference. Use up to four times daily as directed. (FOR ICD-9 250.00, 250.01).   cloNIDine (CATAPRES) 0.1 MG tablet Take 0.1 mg by mouth 2 (two) times daily.   clopidogrel (PLAVIX) 75 MG tablet TAKE 1 TABLET BY MOUTH  DAILY   ferrous gluconate (FERGON) 324 MG tablet Take 324 mg by mouth 2 (two) times daily.   GARLIC PO Take by mouth.   glucose blood (ONE TOUCH TEST STRIPS) test strip Use to test blood sugar twice daily   hydrALAZINE (APRESOLINE) 10 MG tablet Take 1 tablet (10 mg total) by mouth in the morning and at bedtime.   insulin degludec (TRESIBA FLEXTOUCH) 100 UNIT/ML FlexTouch Pen Inject 6  Units into the skin daily.   Insulin Pen Needle (BD PEN NEEDLE NANO U/F) 32G X 4 MM MISC USE 1 DAILY   Metoprolol Tartrate 37.5 MG TABS Take 1 tablet by mouth 2 (two) times daily.   nitroGLYCERIN (NITROSTAT) 0.4 MG SL tablet Place 1 tablet (0.4 mg total) under the tongue every 5 (five) minutes as needed for chest pain. (Patient not taking: Reported on 03/07/2021)   ONE TOUCH LANCETS MISC Use as directed 2 times per day   vitamin C (ASCORBIC ACID) 250 MG tablet Take 250 mg by mouth daily.   No facility-administered encounter medications on file as of 03/07/2021.    Thank you for the opportunity to participate in the care of Ms. Marrufo.  The palliative care team will continue to follow. Please call our office at [phone] if we can be of additional assistance.   Jason Coop, NP   COVID-19 PATIENT SCREENING TOOL Asked and negative response unless otherwise noted:   Have you had symptoms of covid, tested positive or been in  contact with someone with symptoms/positive test in the past 5-10 days?

## 2021-03-08 DIAGNOSIS — Z139 Encounter for screening, unspecified: Secondary | ICD-10-CM | POA: Diagnosis not present

## 2021-03-08 NOTE — Telephone Encounter (Signed)
Patient DPR notified and she will stop megace.

## 2021-03-08 NOTE — Telephone Encounter (Signed)
ConfirmedMegace removed from med list.

## 2021-03-12 DIAGNOSIS — H34812 Central retinal vein occlusion, left eye, with macular edema: Secondary | ICD-10-CM | POA: Diagnosis not present

## 2021-03-13 ENCOUNTER — Other Ambulatory Visit: Payer: Self-pay

## 2021-03-13 DIAGNOSIS — C50919 Malignant neoplasm of unspecified site of unspecified female breast: Secondary | ICD-10-CM

## 2021-03-14 ENCOUNTER — Other Ambulatory Visit: Payer: Self-pay

## 2021-03-14 ENCOUNTER — Encounter: Payer: Self-pay | Admitting: Oncology

## 2021-03-14 ENCOUNTER — Inpatient Hospital Stay: Payer: Medicare Other | Attending: Oncology

## 2021-03-14 ENCOUNTER — Inpatient Hospital Stay (HOSPITAL_BASED_OUTPATIENT_CLINIC_OR_DEPARTMENT_OTHER): Payer: Medicare Other | Admitting: Oncology

## 2021-03-14 VITALS — BP 136/58 | HR 65 | Temp 96.8°F | Resp 16 | Wt 81.0 lb

## 2021-03-14 DIAGNOSIS — Z7189 Other specified counseling: Secondary | ICD-10-CM | POA: Diagnosis not present

## 2021-03-14 DIAGNOSIS — Z79811 Long term (current) use of aromatase inhibitors: Secondary | ICD-10-CM | POA: Diagnosis not present

## 2021-03-14 DIAGNOSIS — J90 Pleural effusion, not elsewhere classified: Secondary | ICD-10-CM

## 2021-03-14 DIAGNOSIS — I129 Hypertensive chronic kidney disease with stage 1 through stage 4 chronic kidney disease, or unspecified chronic kidney disease: Secondary | ICD-10-CM | POA: Insufficient documentation

## 2021-03-14 DIAGNOSIS — Z803 Family history of malignant neoplasm of breast: Secondary | ICD-10-CM | POA: Diagnosis not present

## 2021-03-14 DIAGNOSIS — Z89612 Acquired absence of left leg above knee: Secondary | ICD-10-CM | POA: Diagnosis not present

## 2021-03-14 DIAGNOSIS — N1831 Chronic kidney disease, stage 3a: Secondary | ICD-10-CM

## 2021-03-14 DIAGNOSIS — E1122 Type 2 diabetes mellitus with diabetic chronic kidney disease: Secondary | ICD-10-CM | POA: Insufficient documentation

## 2021-03-14 DIAGNOSIS — C50919 Malignant neoplasm of unspecified site of unspecified female breast: Secondary | ICD-10-CM | POA: Diagnosis not present

## 2021-03-14 DIAGNOSIS — M8580 Other specified disorders of bone density and structure, unspecified site: Secondary | ICD-10-CM | POA: Insufficient documentation

## 2021-03-14 DIAGNOSIS — D571 Sickle-cell disease without crisis: Secondary | ICD-10-CM | POA: Insufficient documentation

## 2021-03-14 DIAGNOSIS — D631 Anemia in chronic kidney disease: Secondary | ICD-10-CM | POA: Diagnosis not present

## 2021-03-14 DIAGNOSIS — I252 Old myocardial infarction: Secondary | ICD-10-CM | POA: Diagnosis not present

## 2021-03-14 LAB — COMPREHENSIVE METABOLIC PANEL
ALT: 109 U/L — ABNORMAL HIGH (ref 0–44)
AST: 211 U/L — ABNORMAL HIGH (ref 15–41)
Albumin: 3.3 g/dL — ABNORMAL LOW (ref 3.5–5.0)
Alkaline Phosphatase: 87 U/L (ref 38–126)
Anion gap: 7 (ref 5–15)
BUN: 35 mg/dL — ABNORMAL HIGH (ref 8–23)
CO2: 18 mmol/L — ABNORMAL LOW (ref 22–32)
Calcium: 8.6 mg/dL — ABNORMAL LOW (ref 8.9–10.3)
Chloride: 111 mmol/L (ref 98–111)
Creatinine, Ser: 1.47 mg/dL — ABNORMAL HIGH (ref 0.44–1.00)
GFR, Estimated: 33 mL/min — ABNORMAL LOW (ref >60)
Glucose, Bld: 166 mg/dL — ABNORMAL HIGH (ref 70–99)
Potassium: 3.6 mmol/L (ref 3.5–5.1)
Sodium: 136 mmol/L (ref 135–145)
Total Bilirubin: 0.5 mg/dL (ref 0.3–1.2)
Total Protein: 6.6 g/dL (ref 6.5–8.1)

## 2021-03-14 LAB — CBC WITH DIFFERENTIAL/PLATELET
Abs Immature Granulocytes: 0.04 10*3/uL (ref 0.00–0.07)
Basophils Absolute: 0.1 10*3/uL (ref 0.0–0.1)
Basophils Relative: 1 %
Eosinophils Absolute: 0.6 10*3/uL — ABNORMAL HIGH (ref 0.0–0.5)
Eosinophils Relative: 6 %
HCT: 33.3 % — ABNORMAL LOW (ref 36.0–46.0)
Hemoglobin: 10.8 g/dL — ABNORMAL LOW (ref 12.0–15.0)
Immature Granulocytes: 0 %
Lymphocytes Relative: 9 %
Lymphs Abs: 0.9 10*3/uL (ref 0.7–4.0)
MCH: 29.8 pg (ref 26.0–34.0)
MCHC: 32.4 g/dL (ref 30.0–36.0)
MCV: 91.7 fL (ref 80.0–100.0)
Monocytes Absolute: 0.5 10*3/uL (ref 0.1–1.0)
Monocytes Relative: 5 %
Neutro Abs: 8.1 10*3/uL — ABNORMAL HIGH (ref 1.7–7.7)
Neutrophils Relative %: 79 %
Platelets: 308 10*3/uL (ref 150–400)
RBC: 3.63 MIL/uL — ABNORMAL LOW (ref 3.87–5.11)
RDW: 14.6 % (ref 11.5–15.5)
WBC: 10.2 10*3/uL (ref 4.0–10.5)
nRBC: 0 % (ref 0.0–0.2)

## 2021-03-14 MED ORDER — ABEMACICLIB 150 MG PO TABS: 150.0000 mg | ORAL_TABLET | Freq: Two times a day (BID) | ORAL | 0 refills | Status: DC

## 2021-03-14 NOTE — Progress Notes (Signed)
Hematology/Oncology  Follow up note [facility] Telephone:([phone] Fax:([phone]) [phone]   Patient Care Team: [name], FNP as PCP - General (Family Medicine) Lauretta Grill, NP as Nurse Practitioner (Nurse Practitioner) Jason Coop, NP as Nurse Practitioner (Hospice and Palliative Medicine) [name], MD as Consulting Physician (Oncology) De Hollingshead, RPH-CPP (Pharmacist) Minna Merritts, MD as Consulting Physician (Cardiology)  REFERRING PROVIDER: Lauretta Grill, NP  CHIEF COMPLAINTS/REASON FOR VISIT:  Follow up for metastatic breast cancer  HISTORY OF PRESENTING ILLNESS:   Tiffany Bailey is a  87 y.o.  female with PMH listed below was seen in consultation at the request of  Lauretta Grill, NP  for evaluation of abnormal CT scan. Patient was recently evaluated by primary care provider for complaints of urinary retention, headache, constipation. 02/03/2019 x-ray of abdomen showed large colonic stool volume, nodular densities noted in the right mid lung are up). 02/08/2019 subsequent chest x-ray two-view showed hyperinflation of the lungs compatible with COPD.  Multiple nodular densities project over the mid and lower right lung. CT chest was obtained for further evaluation. 02/17/2019 CT chest without contrast Showed multiple bilateral pulmonary nodules,groundglass opacities. Largest solid nodule at the confluence of the right major and minor fissures measuring 1.6 cm.  Most notable groundglass nodule is of the right upper lobe and measure 1.8 x 0.8 cm, small right pleural effusion with associated atelectasis or consolidation.  Larger solid nodule are highly suspicious for metastatic disease. Bulky right mediastinal and hilar lymph node.  Largest pretracheal nodes measuring 3.5 x 3 cm.  There are additional bulky superior mediastinal, supraclavicular, left subpectoral lymph nodes.  Largest subpectoral nodes measuring 3.3 x 2.5 cm.   Patient was sent to cancer center for further evaluation.  #Patient reports a history of left breast cancer status post mastectomy.  She is a poor historian.  She is not able to recall the timeframe of breast cancer diagnosis and then treatments.  Denies any chemotherapy or radiation treatments.  #Left lower extremity history of amputation, CKD, History of MI, Sickle cell disease, DM, HTN, anemia.   Patient denies shortness of breath, chest pain, hemoptysis.  Night sweating, abdominal pain. Reports intentional weight loss.  Appetite is poor.  Not eating much. Also have wax and wane, chronic intermittent headache, she takes Tylenol as needed, with some relief.  Denies any nausea vomiting. She sees neurology. 11/10/2018 CT head showed no acute intracranial abnormality or significant interval changes.  Stable atrophy and white matter disease.  Atherosclerosis.  #Patient had a consultation visit with me on 02/19/2019.  At that time he was referred for abnormal CT scan.  PET scan was obtained and I recommend patient to obtain left axillary lymph node biopsy.  Patient/family called back and decided not to proceed with any additional work-up.  #February 2020 started Arimidex INTERVAL HISTORY MEE Tiffany Bailey is a 87 y.o. female who has above history reviewed by me today presents for follow up visit for management of metastatic breast cancer. Problems and complaints are listed below: Patient was accompanied by her daughter.  She feels more fatigued recently.  Appetite is fair. Denies SOB more than her baseline.    Review of Systems  Constitutional:  Positive for fatigue. Negative for appetite change, chills, fever and unexpected weight change.  HENT:   Negative for hearing loss and voice change.   Eyes:  Negative for eye problems.  Respiratory:  Negative for chest tightness, cough and shortness of breath.   Cardiovascular:  Negative for chest pain.  Gastrointestinal:  Negative for abdominal  distention, abdominal pain, blood in stool and constipation.  Endocrine: Negative for hot flashes.  Genitourinary:  Negative for difficulty urinating and frequency.   Musculoskeletal:  Negative for arthralgias.       Left lower extremity history of amputation, intermittent sharp pain  Skin:  Negative for itching and rash.  Neurological:  Negative for extremity weakness and headaches.  Hematological:  Negative for adenopathy.  Psychiatric/Behavioral:  Negative for confusion.    MEDICAL HISTORY:  Past Medical History:  Diagnosis Date   Anemia    Anginal pain ([place])    Arthritis    Breast cancer ([place]) 07/19/2019   CKD (chronic kidney disease), stage III ([place])    Coronary artery disease    a. 2002 CABG x 3 (LIMA-LAD, SVG-D1, SVG-RCA); b. s/p multiple PCI's.   Diabetes mellitus without complication ([place])    Diastolic dysfunction    a. 12/2017 Echo: EF 55-60%, no rwma, Gr1 DD. Triv AI. Mild-mod MR/TR. PASP 3mHg.   Edema    Failure to thrive in adult    GERD (gastroesophageal reflux disease)    Hb-SS disease without crisis ([place] 10/23/2017   History of breast cancer    HOH (hard of hearing)    Hyperlipidemia    Hypertension    Myocardial infarction ([facility]    PAD (peripheral artery disease) ([place]    a. s/p L AKA; b. 12/2018 PTA R peroneal & PTA/Stenting R SFA.   Palpitations     SURGICAL HISTORY: Past Surgical History:  Procedure Laterality Date   ABDOMINAL HYSTERECTOMY     ABOVE KNEE LEG AMPUTATION Left 2013   ANTERIOR VITRECTOMY Left 11/16/2015   Procedure: ANTERIOR VITRECTOMY;  Surgeon: BEulogio Bear MD;  Location: [facility] ORS;  Service: Ophthalmology;  Laterality: Left;   BLADDER SURGERY     CATARACT EXTRACTION W/PHACO Left 11/16/2015   Procedure: CATARACT EXTRACTION PHACO AND INTRAOCULAR LENS PLACEMENT (IOC);  Surgeon: BEulogio Bear MD;  Location: [facility] ORS;  Service: Ophthalmology;  Laterality: Left;  Lot # [id number] [place] 01:22.8 AP%:12.6 CDE: 10.46   CATARACT EXTRACTION  W/PHACO Right 08/22/2016   Procedure: CATARACT EXTRACTION PHACO AND INTRAOCULAR LENS PLACEMENT (IOC);  Surgeon: BEulogio Bear MD;  Location: [facility] ORS;  Service: Ophthalmology;  Laterality: Right;  Lot # [id number] [place] 00:52.1 AP%:8.7 CDE: 4.99    CORONARY ANGIOPLASTY     STENT   CORONARY ARTERY BYPASS GRAFT     LOWER EXTREMITY ANGIOGRAPHY Right 01/14/2019   Procedure: LOWER EXTREMITY ANGIOGRAPHY;  Surgeon: DAlgernon Huxley MD;  [facility];  Service: Cardiovascular;  Laterality: Right;   MASTECTOMY     TUMOR REMOVAL     ABDOMINAL    SOCIAL HISTORY: Social History   Socioeconomic History   Marital status: Divorced    Spouse name: Not on file   Number of children: 7   Years of education: Not on file   Highest education level: Not on file  Occupational History   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  Tobacco Use   Smoking status: Never   Smokeless tobacco: Never  Substance and Sexual Activity   Alcohol use: No   Drug use: No   Sexual activity: Not Currently  Other Topics Concern   Not on file  Social History Narrative   Not on file   Social Determinants of Health   Financial Resource Strain: Low Risk    Difficulty of Paying Living  Expenses: Not hard at all  Food Insecurity: Not on file  Transportation Needs: Not on file  Physical Activity: Not on file  Stress: Not on file  Social Connections: Not on file  Intimate Partner Violence: Not on file    FAMILY HISTORY: Family History  Problem Relation Age of Onset   Sudden death Mother    Arthritis Father    Stroke Father    Breast cancer Sister    Diabetes Grandchild     ALLERGIES:  is allergic to amoxicillin, gabapentin, mirtazapine, and other.  MEDICATIONS:  Current Outpatient Medications  Medication Sig Dispense Refill   amLODipine (NORVASC) 10 MG tablet TAKE 1 TABLET BY MOUTH  DAILY 30 tablet 0   anastrozole (ARIMIDEX) 1 MG tablet Take 1 tablet (1 mg total) by mouth daily. 90  tablet 3   aspirin EC 81 MG tablet Take 1 tablet (81 mg total) by mouth daily. 30 tablet 5   atorvastatin (LIPITOR) 40 MG tablet TAKE 1 TABLET BY MOUTH  DAILY 90 tablet 3   azelastine (ASTELIN) 0.1 % nasal spray Place 1 spray into both nostrils 2 (two) times daily. Use in each nostril as directed 30 mL 4   blood glucose meter kit and supplies KIT Dispense based on patient and insurance preference. Use up to four times daily as directed. (FOR ICD-9 250.00, 250.01). 1 each 0   cloNIDine (CATAPRES) 0.1 MG tablet Take 0.1 mg by mouth 2 (two) times daily.     clopidogrel (PLAVIX) 75 MG tablet TAKE 1 TABLET BY MOUTH  DAILY 30 tablet 0   empagliflozin (JARDIANCE) 10 MG TABS tablet Take 1 tablet (10 mg total) by mouth daily before breakfast. 90 tablet 1   ferrous gluconate (FERGON) 324 MG tablet Take 324 mg by mouth 2 (two) times daily.     GARLIC PO Take by mouth.     glucose blood (ONE TOUCH TEST STRIPS) test strip Use to test blood sugar twice daily 200 each 3   hydrALAZINE (APRESOLINE) 10 MG tablet Take 1 tablet (10 mg total) by mouth in the morning and at bedtime. 180 tablet 3   insulin degludec (TRESIBA FLEXTOUCH) 100 UNIT/ML FlexTouch Pen Inject 6 Units into the skin daily. 6 mL 1   Insulin Pen Needle (BD PEN NEEDLE NANO U/F) 32G X 4 MM MISC USE 1 DAILY 100 each 12   Metoprolol Tartrate 37.5 MG TABS Take 1 tablet by mouth 2 (two) times daily.     ONE TOUCH LANCETS MISC Use as directed 2 times per day 200 each 3   vitamin C (ASCORBIC ACID) 250 MG tablet Take 250 mg by mouth daily.     nitroGLYCERIN (NITROSTAT) 0.4 MG SL tablet Place 1 tablet (0.4 mg total) under the tongue every 5 (five) minutes as needed for chest pain. (Patient not taking: No sig reported) 25 tablet 4   No current facility-administered medications for this visit.     PHYSICAL EXAMINATION: ECOG PERFORMANCE STATUS: 2 - Symptomatic, <50% confined to bed Vitals:   03/14/21 1448  BP: (!) 136/58  Pulse: 65  Resp: 16  Temp:  (!) 96.8 F (36 C)  SpO2: 100%   Filed Weights   03/14/21 1400 03/14/21 1444  Weight: 81 lb (36.7 kg) 81 lb (36.7 kg)    Physical Exam Constitutional:      General: She is not in acute distress.    Comments: She sits in the wheel chair.  Frail appearance  HENT:  Head: Normocephalic and atraumatic.  Eyes:     General: No scleral icterus.    Pupils: Pupils are equal, round, and reactive to light.  Cardiovascular:     Rate and Rhythm: Normal rate and regular rhythm.     Heart sounds: Normal heart sounds.  Pulmonary:     Effort: Pulmonary effort is normal. No respiratory distress.     Breath sounds: No wheezing.     Comments: Decreased breath sound right lower lobe.  Good air entry on the left base.  Abdominal:     General: Bowel sounds are normal. There is no distension.     Palpations: Abdomen is soft.  Musculoskeletal:        General: No deformity. Normal range of motion.     Cervical back: Normal range of motion and neck supple.     Comments: Left lower extremity history of amputation.   Skin:    General: Skin is warm and dry.     Findings: No erythema or rash.  Neurological:     Mental Status: She is alert and oriented to person, place, and time. Mental status is at baseline.     Cranial Nerves: No cranial nerve deficit.     Coordination: Coordination normal.  Psychiatric:        Mood and Affect: Mood normal.     LABORATORY DATA:  I have reviewed the data as listed Lab Results  Component Value Date   WBC 10.2 03/14/2021   HGB 10.8 (L) 03/14/2021   HCT 33.3 (L) 03/14/2021   MCV 91.7 03/14/2021   PLT 308 03/14/2021   Recent Labs    09/14/20 0957 11/08/20 1458 01/12/21 1133 01/20/21 1830 02/13/21 1226 03/14/21 1416  NA 137   < > 130* 126* 134* 136  K 4.4   < > 4.3 4.6 4.3 3.6  CL 105   < > 105 101 104 111  CO2 21*   < > 18* 21* 22 18*  GLUCOSE 109*   < > 200* 190* 173* 166*  BUN 31*   < > 27* 25* 24* 35*  CREATININE 1.25*   < > 1.20* 1.15* 1.30*  1.47*  CALCIUM 9.0   < > 8.4* 8.5* 9.0 8.6*  GFRNONAA 41*   < > 43* 45*  --  33*  PROT 6.5  --  5.9*  --   --  6.6  ALBUMIN 3.5  --  3.0*  --   --  3.3*  AST 26  --  27  --   --  211*  ALT 20  --  21  --   --  109*  ALKPHOS 101  --  85  --   --  87  BILITOT 0.5  --  0.2*  --   --  0.5   < > = values in this interval not displayed.    Iron/TIBC/Ferritin/ %Sat    Component Value Date/Time   IRON 33 01/12/2021 1133   TIBC 220 (L) 01/12/2021 1133   FERRITIN 74 01/12/2021 1133   IRONPCTSAT 15 01/12/2021 1133   IRONPCTSAT 27 06/19/2018 1006      RADIOGRAPHIC STUDIES: I have personally reviewed the radiological images as listed and agreed with the findings in the report. DG Chest 2 View  Result Date: 02/14/2021 CLINICAL DATA:  Persistent cough EXAM: CHEST - 2 VIEW COMPARISON:  CT from 03/21/2020 FINDINGS: Cardiac shadow is within normal limits. Postsurgical changes are noted. Lungs are well aerated bilaterally. No focal infiltrate is  seen. Small right-sided pleural effusion is noted stable from the prior exam with minimal basilar atelectasis. Nodular changes are noted in the right mid lung and right lung base increased from the prior CT examination. Repeat CT is recommended for follow-up. No bony abnormality is seen. IMPRESSION: Increase in nodular changes within the right lung when compare with the prior exam likely related to increasing metastatic disease. CT of the chest with contrast is recommended for further evaluation. These results will be called to the ordering clinician or representative by the Radiologist Assistant, and communication documented in the PACS or [employer]. Electronically Signed   By: [name] M.D.   On: 02/14/2021 09:56   CT Head Wo Contrast  Result Date: 01/07/2021 CLINICAL DATA:  New or worsening headache. EXAM: CT HEAD WITHOUT CONTRAST TECHNIQUE: Contiguous axial images were obtained from the base of the skull through the vertex without intravenous  contrast. COMPARISON:  Oct 19, 2019 FINDINGS: Brain: No subdural, epidural, or subarachnoid hemorrhage. Ventricles and sulci are stable. Cerebellum, brainstem, and basal cisterns are normal. Scattered white matter changes again identified. No acute cortical ischemia or infarct. No mass effect or midline shift. Vascular: Calcified atherosclerosis in the intracranial carotids. Skull: Normal. Negative for fracture or focal lesion. Sinuses/Orbits: No acute finding. Other: None. IMPRESSION: No acute intracranial abnormalities are identified to explain the patient's headache. Electronically Signed   By: Dorise Bullion III M.D   On: 01/07/2021 12:52   CT CHEST ABDOMEN PELVIS WO CONTRAST  Result Date: 03/02/2021 CLINICAL DATA:  History of metastatic breast cancer in a 87 year old female EXAM: CT CHEST, ABDOMEN AND PELVIS WITHOUT CONTRAST TECHNIQUE: Multidetector CT imaging of the chest, abdomen and pelvis was performed following the standard protocol without IV contrast. COMPARISON:  October of 2021 FINDINGS: CT CHEST FINDINGS Cardiovascular: Calcified atheromatous plaque in the thoracic aorta. No aneurysmal dilation. Three-vessel coronary artery disease, abundant three-vessel disease. No substantial pericardial effusion. Normal caliber of central pulmonary vessels. Mediastinum/Nodes: Bulky precarinal/RIGHT paratracheal adenopathy 2.7 cm (image 23/7) previously approximately 1.5 cm. Bulky subcarinal lymphadenopathy (image 26/7) 2.5 cm, previously 1.9 cm. No thoracic inlet adenopathy, axillary adenopathy or internal mammary adenopathy. Smaller lymph nodes identified in the upper mediastinum on the previous study are unchanged. Lungs/Pleura: Enlarging RIGHT-sided effusion now moderate and layering posteriorly multiple pulmonary nodules. (Image 65/8) 1.9 cm greatest axial dimension, RIGHT upper lobe posterior pulmonary nodule with mixed attenuation measuring 1.9 cm previously. (Image 90/8) new 7 x 6 mm pulmonary nodule in  the LEFT upper lobe along the pleural surface. (Image 97/8) nodule along an accessory fissure in the RIGHT lower lobe measuring 12 x 9 mm previously 9 mm greatest dimension. (Image 84/8) 7 mm perifissural nodule previously 3 mm. Nodularity along the confluence of fissures in the RIGHT mid chest now with increased thickness at 6 mm greatest thickness, previously 4 mm. This spans 1.8 cm of the fissure previously 1.6 cm. Enlarging Peri fissural nodule (image 46/8) 7 mm previously 2-3 mm. Areas of ground-glass attenuation in the chest in the LEFT chest and RIGHT upper lobe show a similar appearance. Airways are patent. Basilar volume loss on the RIGHT. Musculoskeletal: See below for full musculoskeletal details. CT ABDOMEN PELVIS FINDINGS Hepatobiliary: Normal liver on noncontrast imaging. No pericholecystic stranding. Pancreas: Splenic atrophy without signs of adjacent inflammation. Spleen: Small spleen as before.  No contour abnormality. Adrenals/Urinary Tract: Adrenal glands are normal. Large RIGHT renal cyst is unchanged. Nephrolithiasis of the bilateral kidneys also similar previous imaging and vascular calcifications as well.  No hydronephrosis. No perinephric stranding. Cortical scarring mild-to-moderate in greatest on the RIGHT. Urinary bladder is moderately distended without signs of thickening or adjacent stranding. Stomach/Bowel: No acute gastrointestinal findings. Stomach is under distended limiting assessment. No small bowel dilation. Appendix not seen. No secondary signs to suggest appendiceal inflammation. Abundant stool throughout the colon with mild to moderate distension of the colon but without secondary signs of inflammation. Vascular/Lymphatic: Aortic atherosclerosis. No sign of aneurysm. Smooth contour of the IVC. There is no gastrohepatic or hepatoduodenal ligament lymphadenopathy. No retroperitoneal or mesenteric lymphadenopathy. No pelvic sidewall lymphadenopathy. Reproductive: No adnexal masses.   Post hysterectomy. Other: No ascites.  Small fat containing umbilical hernia. Musculoskeletal: Spinal degenerative changes. 40% loss of height at the L5 level in this osteopenic patient. No priors for comparison of this location since September of 2020. No surrounding stranding. Mild retropulsion of posterior cortical elements without significant canal narrowing. IMPRESSION: Worsening of disease in the chest with enlarging mediastinal adenopathy and pulmonary metastatic disease. Mixed attenuation and ground-glass nodules without change. No evidence of metastatic disease involving the abdomen or pelvis. Enlarging RIGHT-sided pleural effusion now moderate. Abundant stool in the colon, query constipation. L5 compression fracture since available imaging. Correlate with any pain in this location. Findings are age indeterminate and associated with the proximally 40% loss of height. Bilateral nephrolithiasis and vascular calcifications. Aortic Atherosclerosis (ICD10-I70.0). Electronically Signed   By: Zetta Bills M.D.   On: 03/02/2021 11:25      ASSESSMENT & PLAN:  1. Metastatic breast cancer ([place])   2. Anemia due to stage 3a chronic kidney disease ([place])   3. Pleural effusion   4. Goals of care, counseling/discussion    #Metastatic breast cancer, ER+, PR- HER2 negative.  Labs reviewed and discussed with patient 03/01/2021, CT chest abdomen pelvis showed bulky precarinal/right paratracheal adenopathy 2.7 cm, enlarged from previous 1.5 cm.  Increase of bulky subcarinal lymphadenopathy 2.5 cm, compared to 1.9 cm previously.  Enlarging right-sided effusion, moderate.  Layering posteriorly multiple pulmonary nodules.  No evidence of metastatic disease involving abdomen or pelvis.  L5 compression fracture.  Age-indeterminate.  Bilateral nephrolithiasis and vascular calcifications.  Aortic atherosclerosis. CT findings are consistent with disease progression. Recommend patient to stop Arimidex. Plan to switch to  fulvestrant plus abemaciclib. Discussed about rationale and potential side effects of the new regimen.  Patient and her daughter agree with the treatment  # Pleural effusion, moderate. She is asymptomatic. If she develops symptoms will proceed with thoracentesis.   Osteopenia,continue  calcium and vitamin D supplementation. Discussed about bisphosphonate and recommend patient to obtain dental clearance  # uncontrolled diabetes.follow up with pcp. # Anemia of CKD, hemoglobin is 9.3.   Iron panel was added today. Ferritin 74, iron saturation 15.  Recommend patient to take oral ferrous sulfate 319m BID.   All questions were answered. The patient knows to call the clinic with any problems questions or concerns. Return of visit:  1 week after start of abemaciclib  ZEarlie Server MD, PhD Hematology OFloridaat AMargaret R. Pardee Memorial HospitalPager- [phone]/28/2022

## 2021-03-14 NOTE — Progress Notes (Signed)
Pt reports being very tired and weak, no energy and sleeps a lot.

## 2021-03-15 ENCOUNTER — Other Ambulatory Visit (HOSPITAL_COMMUNITY): Payer: Self-pay

## 2021-03-15 ENCOUNTER — Telehealth: Payer: Self-pay | Admitting: Pharmacy Technician

## 2021-03-15 ENCOUNTER — Encounter: Payer: Self-pay | Admitting: Oncology

## 2021-03-15 NOTE — Telephone Encounter (Signed)
Oral Oncology Patient Advocate Encounter   Received notification from [employer] that prior authorization for Verzenio is required.   PA submitted on CoverMyMeds Key [id number] Status is pending   Oral Oncology Clinic will continue to follow.  [place] Patient [place] Phone [phone] Fax [phone] 03/15/2021 12:12 PM

## 2021-03-15 NOTE — Telephone Encounter (Signed)
Oral Oncology Patient Advocate Encounter  Prior Authorization for Tiffany Bailey has been approved.    PA# [id number] Effective dates: 03/15/21 through 06/16/21  Patients co-pay is $0.00  Oral Oncology Clinic will continue to follow.   [place] Patient [place] Phone [phone] Fax [phone] 03/15/2021 12:14 PM

## 2021-03-16 ENCOUNTER — Inpatient Hospital Stay: Payer: Medicare Other

## 2021-03-16 ENCOUNTER — Telehealth: Payer: Self-pay | Admitting: Pharmacist

## 2021-03-16 DIAGNOSIS — I129 Hypertensive chronic kidney disease with stage 1 through stage 4 chronic kidney disease, or unspecified chronic kidney disease: Secondary | ICD-10-CM | POA: Diagnosis not present

## 2021-03-16 DIAGNOSIS — M8580 Other specified disorders of bone density and structure, unspecified site: Secondary | ICD-10-CM | POA: Diagnosis not present

## 2021-03-16 DIAGNOSIS — Z89612 Acquired absence of left leg above knee: Secondary | ICD-10-CM | POA: Diagnosis not present

## 2021-03-16 DIAGNOSIS — Z79811 Long term (current) use of aromatase inhibitors: Secondary | ICD-10-CM

## 2021-03-16 DIAGNOSIS — C50919 Malignant neoplasm of unspecified site of unspecified female breast: Secondary | ICD-10-CM | POA: Diagnosis not present

## 2021-03-16 DIAGNOSIS — E1122 Type 2 diabetes mellitus with diabetic chronic kidney disease: Secondary | ICD-10-CM | POA: Diagnosis not present

## 2021-03-16 DIAGNOSIS — I252 Old myocardial infarction: Secondary | ICD-10-CM | POA: Diagnosis not present

## 2021-03-16 DIAGNOSIS — Z803 Family history of malignant neoplasm of breast: Secondary | ICD-10-CM | POA: Diagnosis not present

## 2021-03-16 DIAGNOSIS — D631 Anemia in chronic kidney disease: Secondary | ICD-10-CM | POA: Diagnosis not present

## 2021-03-16 DIAGNOSIS — N1831 Chronic kidney disease, stage 3a: Secondary | ICD-10-CM | POA: Diagnosis not present

## 2021-03-16 DIAGNOSIS — I10 Essential (primary) hypertension: Secondary | ICD-10-CM | POA: Diagnosis not present

## 2021-03-16 MED ORDER — FULVESTRANT 250 MG/5ML IM SOLN
500.0000 mg | Freq: Once | INTRAMUSCULAR | Status: AC
  Administered 2021-03-16: 500 mg via INTRAMUSCULAR
  Filled 2021-03-16: qty 10

## 2021-03-16 NOTE — Telephone Encounter (Signed)
Oral Oncology Pharmacist Encounter  Received new prescription for Verzenio (abemaciclib) for the treatment of progressive metastatic breast cancer, ER+, PR- and HER2-, in conjunction with fulvestrant, planned duration until disease progression or unacceptable drug toxicity.  CBC/CMP from 03/14/21 assessed, AST/ALT elevated continue to monitor. Prescription dose and frequency assessed.   Current medication list in Epic reviewed, no DDIs with abemaciclib identified.   Evaluated chart and no patient barriers to medication adherence identified.   Prescription has been e-scribed to the [facility] for benefits analysis and approval.  Oral Oncology Clinic will continue to follow for insurance authorization, copayment issues, initial counseling and start date.  Patient agreed to treatment on 03/14/21 per MD documentation.  Alyson N. Leonard, PharmD, BCPS, BCOP, CPP Hematology/Oncology Clinical Pharmacist Practitioner [facility] Oral Chemotherapy Navigation Clinic [phone]  03/16/2021 4:06 PM  

## 2021-03-19 ENCOUNTER — Other Ambulatory Visit (HOSPITAL_COMMUNITY): Payer: Self-pay

## 2021-03-19 MED ORDER — ABEMACICLIB 150 MG PO TABS
150.0000 mg | ORAL_TABLET | Freq: Two times a day (BID) | ORAL | 0 refills | Status: DC
  Filled 2021-03-19: qty 56, 28d supply, fill #0

## 2021-03-19 NOTE — Telephone Encounter (Signed)
Oral Oncology Patient Advocate Encounter  I spoke with [name], patients daughter, this afternoon to set up delivery of Verzenio.  Address verified for shipment.  Verzenio will be filled through [facility] and mailed 03/19/21 for delivery 03/20/21.    [place] will call 7-10 days before next refill is due to complete adherence call and set up delivery of medication.     [place] Patient [place] Phone [phone] Fax [phone] 03/19/2021 2:55 PM

## 2021-03-20 ENCOUNTER — Other Ambulatory Visit: Payer: Self-pay

## 2021-03-20 ENCOUNTER — Inpatient Hospital Stay: Payer: Medicare Other | Attending: Oncology | Admitting: Pharmacist

## 2021-03-20 ENCOUNTER — Other Ambulatory Visit: Payer: Medicare Other

## 2021-03-20 DIAGNOSIS — I252 Old myocardial infarction: Secondary | ICD-10-CM | POA: Insufficient documentation

## 2021-03-20 DIAGNOSIS — D571 Sickle-cell disease without crisis: Secondary | ICD-10-CM | POA: Insufficient documentation

## 2021-03-20 DIAGNOSIS — C50919 Malignant neoplasm of unspecified site of unspecified female breast: Secondary | ICD-10-CM | POA: Insufficient documentation

## 2021-03-20 DIAGNOSIS — N1831 Chronic kidney disease, stage 3a: Secondary | ICD-10-CM | POA: Insufficient documentation

## 2021-03-20 DIAGNOSIS — Z79811 Long term (current) use of aromatase inhibitors: Secondary | ICD-10-CM | POA: Insufficient documentation

## 2021-03-20 DIAGNOSIS — D631 Anemia in chronic kidney disease: Secondary | ICD-10-CM | POA: Insufficient documentation

## 2021-03-20 DIAGNOSIS — I129 Hypertensive chronic kidney disease with stage 1 through stage 4 chronic kidney disease, or unspecified chronic kidney disease: Secondary | ICD-10-CM | POA: Insufficient documentation

## 2021-03-20 DIAGNOSIS — E1122 Type 2 diabetes mellitus with diabetic chronic kidney disease: Secondary | ICD-10-CM | POA: Insufficient documentation

## 2021-03-20 DIAGNOSIS — Z89612 Acquired absence of left leg above knee: Secondary | ICD-10-CM | POA: Insufficient documentation

## 2021-03-20 DIAGNOSIS — M8580 Other specified disorders of bone density and structure, unspecified site: Secondary | ICD-10-CM | POA: Insufficient documentation

## 2021-03-20 DIAGNOSIS — Z803 Family history of malignant neoplasm of breast: Secondary | ICD-10-CM | POA: Insufficient documentation

## 2021-03-20 NOTE — Progress Notes (Signed)
[name]  [phone] [phone]  Patient Care Team: [name], FNP as PCP - General (Family Medicine) Lauretta Grill, NP as Nurse Practitioner (Nurse Practitioner) Jason Coop, NP as Nurse Practitioner (Hospice and Palliative Medicine) [name], MD as Consulting Physician (Oncology) De Hollingshead, RPH-CPP (Pharmacist) Minna Merritts, MD as Consulting Physician (Cardiology)   Name of the patient: Tiffany Bailey  [id number]  03-19-1928   Date of visit: 03/20/21  Virtual visit: I connected with  Tiffany Bailey daughter/caregiver Vivianon 03/20/21 at 10:00 AM EDT by a video enabled telemedicine application and verified that I am speaking with the correct person using two identifiers. I discussed the limitations of evaluation and management by telemedicine and the availability of in person appointments. The patient expressed understanding and agreed to proceed. Locations: Caregiver: home , Provider: in office  HPI: Patient is a 87 y.o. female with progressive metastatic breast cancer, ER+, PR- and HER2-. Planned treatment with Verzenio (abemaciclib) and fulvestrant.  Reason for Consult: Verzenio (abemaciclib) oral chemotherapy education.   PAST MEDICAL HISTORY: Past Medical History:  Diagnosis Date   Anemia    Anginal pain ([place])    Arthritis    Breast cancer ([place]) 07/19/2019   CKD (chronic kidney disease), stage III ([place])    Coronary artery disease    a. 2002 CABG x 3 (LIMA-LAD, SVG-D1, SVG-RCA); b. s/p multiple PCI's.   Diabetes mellitus without complication ([place])    Diastolic dysfunction    a. 12/2017 Echo: EF 55-60%, no rwma, Gr1 DD. Triv AI. Mild-mod MR/TR. PASP 7mHg.   Edema    Failure to thrive in adult    GERD (gastroesophageal reflux disease)    Hb-SS disease without crisis ([facility] 10/23/2017   History of breast cancer    HOH (hard of hearing)     Hyperlipidemia    Hypertension    Myocardial infarction ([facility]    PAD (peripheral artery disease) ([facility]    a. s/p L AKA; b. 12/2018 PTA R peroneal & PTA/Stenting R SFA.   Palpitations     HEMATOLOGY/ONCOLOGY HISTORY:  Oncology History  Breast cancer ([facility]  07/19/2019 Initial Diagnosis   [place]ast [place] ([facility]   07/26/2019 - 07/26/2019 Chemotherapy   The patient had anastrozole (ARIMIDEX) 1 MG tablet, 1 mg, Oral, Daily, 0 of 1 cycle, Start date: --, End date: -- abemaciclib (VERZENIO) 150 MG tablet, 150 mg, Oral, 2 times daily, 0 of 1 cycle, Start date: --, End date: --   for chemotherapy treatment.       ALLERGIES:  is allergic to amoxicillin, gabapentin, mirtazapine, and other.  MEDICATIONS:  Current Outpatient Medications  Medication Sig Dispense Refill   abemaciclib (VERZENIO) 150 MG tablet Take 1 tablet (150 mg total) by mouth 2 (two) times daily. Swallow tablets whole. Do not chew, crush, or split tablets before swallowing. 56 tablet 0   amLODipine (NORVASC) 10 MG tablet TAKE 1 TABLET BY MOUTH  DAILY 30 tablet 0   aspirin EC 81 MG tablet Take 1 tablet (81 mg total) by mouth daily. 30 tablet 5   atorvastatin (LIPITOR) 40 MG tablet TAKE 1 TABLET BY MOUTH  DAILY 90 tablet 3   azelastine (ASTELIN) 0.1 % nasal spray Place 1 spray into both nostrils 2 (two) times daily. Use in each nostril as directed 30 mL 4   blood glucose meter kit and supplies KIT Dispense based on patient and insurance preference. Use up to four times daily  as directed. (FOR ICD-9 250.00, 250.01). 1 each 0   cloNIDine (CATAPRES) 0.1 MG tablet Take 0.1 mg by mouth 2 (two) times daily.     clopidogrel (PLAVIX) 75 MG tablet TAKE 1 TABLET BY MOUTH  DAILY 30 tablet 0   empagliflozin (JARDIANCE) 10 MG TABS tablet Take 1 tablet (10 mg total) by mouth daily before breakfast. 90 tablet 1   ferrous gluconate (FERGON) 324 MG tablet Take 324 mg by mouth 2 (two) times daily.     GARLIC PO Take by mouth.     glucose blood (ONE  TOUCH TEST STRIPS) test strip Use to test blood sugar twice daily 200 each 3   hydrALAZINE (APRESOLINE) 10 MG tablet Take 1 tablet (10 mg total) by mouth in the morning and at bedtime. 180 tablet 3   insulin degludec (TRESIBA FLEXTOUCH) 100 UNIT/ML FlexTouch Pen Inject 6 Units into the skin daily. 6 mL 1   Insulin Pen Needle (BD PEN NEEDLE NANO U/F) 32G X 4 MM MISC USE 1 DAILY 100 each 12   Metoprolol Tartrate 37.5 MG TABS Take 1 tablet by mouth 2 (two) times daily.     nitroGLYCERIN (NITROSTAT) 0.4 MG SL tablet Place 1 tablet (0.4 mg total) under the tongue every 5 (five) minutes as needed for chest pain. (Patient not taking: No sig reported) 25 tablet 4   ONE TOUCH LANCETS MISC Use as directed 2 times per day 200 each 3   vitamin C (ASCORBIC ACID) 250 MG tablet Take 250 mg by mouth daily.     No current facility-administered medications for this visit.    VITAL SIGNS: There were no vitals taken for this visit. There were no vitals filed for this visit.  Estimated body mass index is 15.82 kg/m as calculated from the following:   Height as of 03/02/21: 5' (1.524 m).   Weight as of 03/14/21: 36.7 kg (81 lb).  LABS: CBC:    Component Value Date/Time   WBC 10.2 03/14/2021 1416   HGB 10.8 (L) 03/14/2021 1416   HCT 33.3 (L) 03/14/2021 1416   PLT 308 03/14/2021 1416   MCV 91.7 03/14/2021 1416   NEUTROABS 8.1 (H) 03/14/2021 1416   LYMPHSABS 0.9 03/14/2021 1416   MONOABS 0.5 03/14/2021 1416   EOSABS 0.6 (H) 03/14/2021 1416   BASOSABS 0.1 03/14/2021 1416   Comprehensive Metabolic Panel:    Component Value Date/Time   NA 136 03/14/2021 1416   K 3.6 03/14/2021 1416   CL 111 03/14/2021 1416   CO2 18 (L) 03/14/2021 1416   BUN 35 (H) 03/14/2021 1416   CREATININE 1.47 (H) 03/14/2021 1416   CREATININE 1.18 (H) 06/19/2018 1006   GLUCOSE 166 (H) 03/14/2021 1416   CALCIUM 8.6 (L) 03/14/2021 1416   AST 211 (H) 03/14/2021 1416   ALT 109 (H) 03/14/2021 1416   ALKPHOS 87 03/14/2021 1416    BILITOT 0.5 03/14/2021 1416   PROT 6.6 03/14/2021 1416   ALBUMIN 3.3 (L) 03/14/2021 1416     Present during today's visit: patient's daughter/caregiver Tiffany Bailey  Assessment and Plan-  Start plan: Tiffany Bailey will get started when she has medication in hand.[place] is scheduled to deliver medication today 03/20/21   Patient Education I spoke with [name] for overview of new oral chemotherapy medication: abemaciclib  Administration: [employer] on administration, dosing, side effects, monitoring, drug-food interactions, safe handling, storage, and disposal. Patient will take 1 tablet (150 mg total) by mouth 2 (two) times daily.  Side Effects:  Side effects include but not limited to: diarrhea, nausea, fatigue, decrease in wbc/hgb/plt. Diarrhea: discussed the high incidence of diarrhea with abemaciclib. Currently Ms. Nickell has more trouble with constipation due to her oral iron supplement. [name] mentioned that her mom takes a medication daily for constipation, but could not remember the name. She knows to stop the constipation medication if diarrhea occurs and start loperamide. [name] plans to pick up some loperamide to have on hand. She knows to call the office if Ms. Broers is having 4 or more loose stools a day or showing signs of dehydration  Drug-drug Interactions (DDI): No DDIs with abemaciclib currently  Adherence: After discussion with [name] no patient barriers to medication adherence identified.  Reviewed with patient importance of keeping a medication schedule and plan for any missed doses.  [name] voiced understanding and appreciation. All questions answered. Medication handout provided.  Provided patient with [facility] phone number. Patient knows to call the office with questions or concerns. [facility] will continue to follow.  [name] expressed understanding and was in agreement with this plan. She also  understands that She can call clinic at any time with any questions, concerns, or complaints.   Medication Access Issues: No issues, fills at [facility], copay $0  Follow-up plan: RTC on 10/14 for MD visit and fulvestrant inj  Thank you for allowing me to participate in the care of this patient.   Time Total: 25 mins  Visit consisted of counseling and education on dealing with issues of symptom management in the setting of serious and potentially life-threatening illness.Greater than 50%  of this time was spent counseling and coordinating care related to the above assessment and plan.  Signed by: Darl Pikes, PharmD, BCPS, Salley Slaughter, CPP Hematology/Oncology Clinical Pharmacist Practitioner [facility]/HP/AP [facility] [phone]  03/20/2021 10:33 AM

## 2021-03-22 ENCOUNTER — Encounter: Payer: Self-pay | Admitting: Family

## 2021-03-23 ENCOUNTER — Telehealth: Payer: Self-pay | Admitting: Family

## 2021-03-23 NOTE — Telephone Encounter (Signed)
I spoke with patient's daughter in depth this morning. Please see mychart message. I thought maybe this was mirtazapine, but I see she has an allergy to this. Please advise? I had advise ED for patient to possibly be evaluated.

## 2021-03-23 NOTE — Telephone Encounter (Signed)
Patient was told to stop taking her appetite pill. She is not eating and daughter would like for her to take it.

## 2021-03-26 NOTE — Telephone Encounter (Signed)
   Call daughter Sch an appt this week. You may add her tomorrow at 12 Ensure she is stable. Is she eating, drinking?    She had been on megace for appetite which was prescribed by dr Tasia Catchings ( oncology) .  I had consulted with Dr Tasia Catchings regarding this.   Our pharmacist reviewed this medication with recommendation below from [name] [place]:    Recommend discontinuation of megestrol due to increased risk of thrombotic events and potentially death in older adults with little clinical impact on appetite/weight (per [place]).   W

## 2021-03-26 NOTE — Telephone Encounter (Signed)
FYI I spoke with Tiffany Bailey patient's daughter & she was willing to bring patient in tomorrow at 12p. I have added to schedule. She said that patient is still not really eating or drinking much. & that she was really dependant on the megestrol. I also explained why that was not an option. Pt scheduled.

## 2021-03-27 ENCOUNTER — Encounter: Payer: Self-pay | Admitting: Family

## 2021-03-27 ENCOUNTER — Telehealth (INDEPENDENT_AMBULATORY_CARE_PROVIDER_SITE_OTHER): Payer: Medicare Other | Admitting: Family

## 2021-03-27 VITALS — BP 120/66 | Ht 60.0 in | Wt 81.0 lb

## 2021-03-27 DIAGNOSIS — E1122 Type 2 diabetes mellitus with diabetic chronic kidney disease: Secondary | ICD-10-CM

## 2021-03-27 DIAGNOSIS — N183 Chronic kidney disease, stage 3 unspecified: Secondary | ICD-10-CM | POA: Diagnosis not present

## 2021-03-27 DIAGNOSIS — E1140 Type 2 diabetes mellitus with diabetic neuropathy, unspecified: Secondary | ICD-10-CM

## 2021-03-27 DIAGNOSIS — R5383 Other fatigue: Secondary | ICD-10-CM | POA: Diagnosis not present

## 2021-03-27 MED ORDER — MIRTAZAPINE 7.5 MG PO TABS: 7.5000 mg | ORAL_TABLET | Freq: Every day | ORAL | 1 refills | Status: DC

## 2021-03-27 NOTE — Progress Notes (Signed)
Virtual Visit via Video Note  I connected with@  on 03/28/21 at  1:00 PM EDT by a video enabled telemedicine application and verified that I am speaking with the correct person using two identifiers.  Location patient: home Location provider:work  Persons participating in the virtual visit: patient, provider  I discussed the limitations of evaluation and management by telemedicine and the availability of in person appointments. The patient expressed understanding and agreed to proceed.   HPI: Accompanied by daughter, [name].  Chief concerns today are fatigue, weakness and decreased appetite.  Daughter describes  that she is sleeping all day.  She is home alone for most of the day and has aide who comes for 2 hours with cleaning, getting dressed, cooks for Monday- Friday.  Her son lives with her however he is gone for most of the day for work.  Weakness x 2 months. Slid off bed 3 days ago and was unable to get up. No head injury, LOC.   Yesterday she ate one egg, sausage biscuit- reports had a couple of bites. Food doesn't 'taste right'. Ate chicken and okra for lunch. She didn't eat last night.  Drink half of ensure yesterday.   No sob. Sleeps well at night. She is taking ferrous gluconate BID.Endorses constipation.   Patient seen by Dr. Tasia Catchings for metastatic breast cancer 03/14/2021.  Per note CT chest abdomen and pelvis showed adenopathy enlarged from previous enlarging right-sided effusion, multiple pulmonary nodules.  No evidence of metastatic disease involving abdomen or pelvis.  L5 compression fracture, age-indeterminate.  CT findings are consistent with disease progression.  Recommend to stop Arimidex and switch to fulvestrant plus abemaciclib.  First infusion 03/16/2021. Palliative care home visit 03/07/2021  DM- last a1c 8.6.  Compliant with Tresiba 6 units qpm, Jardiance 10 mg daily. Feels blood sugar has improved with jardiance.  She is not taking blood sugar readings. No changes to  vision.  Drinks half soda per day. No known hypoglycemic episodes.    ROS: See pertinent positives and negatives per HPI.    EXAM:  VITALS per patient if applicable: BP 580/99   Ht 5' (1.524 m)   Wt 81 lb (36.7 kg) Comment: this is a guess from daughter  BMI 15.82 kg/m  BP Readings from Last 3 Encounters:  03/27/21 120/66  03/14/21 (!) 136/58  03/02/21 (!) 150/54   Wt Readings from Last 3 Encounters:  03/27/21 81 lb (36.7 kg)  03/14/21 81 lb (36.7 kg)  01/07/21 83 lb 15.9 oz (38.1 kg)    GENERAL: alert, oriented, appears well and in no acute distress  HEENT: atraumatic, conjunttiva clear, no obvious abnormalities on inspection of external nose and ears  NECK: normal movements of the head and neck  LUNGS: on inspection no signs of respiratory distress, breathing rate appears normal, no obvious gross SOB, gasping or wheezing  CV: no obvious cyanosis  MS: moves all visible extremities without noticeable abnormality  PSYCH/NEURO: pleasant and cooperative, no obvious depression or anxiety, speech and thought processing grossly intact  ASSESSMENT AND PLAN:  Discussed the following assessment and plan:  Problem List Items Addressed This Visit       Endocrine   CKD stage 3 due to type 2 diabetes mellitus ([place])    Uncontrolled based on last A1c.  Concern is patient's not been checking blood sugar and she has poor appetite she is also on [place].  She denies any known hypoglycemic episodes.  She does report feeling low at times.  She  is vague with this.  Emphasized the importance in regards to her safety to daughter and patient stated she must start taking fasting blood sugar in the morning and certainly spot check blood sugar if she were to feel low.  It is hard to discern when she is complaining of fatigue if this is a low blood sugar or related to her fatigue.  We may opt to further decrease [name] due to noncompliance and safety or discontinue altogether and increase  Jardiance 25 mg. CrtCl -      Type 2 diabetes mellitus with diabetic neuropathy, unspecified ([place])    Lab Results  Component Value Date   HGBA1C 8.6 (A) 02/13/2021  Difficult to ascertain if controlled as we do not have home readings.  Reiterated the importance of her safety, she needs to be checking fasting blood glucose in the morning and blood sugar needs to be checked if she has a feeling that it may be low.  I am concerned that her inadequate dietary intake, calories will put her at high risk for hypoglycemic episodes.  For this reason, I have engaged home health with nursing specifically to help patient check blood sugar as well as to liver Lantus.  In the setting of CKD, she is not a candidate for metformin ( GFR 33) and as approaching GFR 30, I am hesisate to increase jardiance. Based on co morbidities and concern for hypoglycemic episode, I think is reasonable A1c to be around 8.5/9 where she currently is. A more strigent goal may be more detrimental. Close follow up.          Other   Fatigue - Primary    Fatigue is complicated by metastatic breast cancer, anemia, CKD, overall deconditioning, poor appetite. She will continue to follow with Dr. Tasia Catchings for infusion, chemotherapy.  We opted not to restart Megace and retrial Remeron.  In the past approximately 5 years ago patient does recall itching.  No shortness of breath on this medication.  She is amenable to retrialing this medication at a low dose.  She will let me know if itching recurs.  Discussed with daughter and patient on video today to increase appetite with low glycemic nutritional drinks such as Glucerna or Ensure.  Referral for home health to include nursing, physical therapy has been placed.  Specifically I would like nursing to help with checking blood sugar daily and physical therapy to assess home for fall safety as well as to help with strengthening exercises, balance.  Consulted with Dr Tasia Catchings via Epic and she will also address  palliative care consult with patient at follow up. Close follow up.       Relevant Medications   mirtazapine (REMERON) 7.5 MG tablet   Other Relevant Orders   Ambulatory referral to [facility]    -we discussed possible serious and likely etiologies, options for evaluation and workup, limitations of telemedicine visit vs in person visit, treatment, treatment risks and precautions. Pt prefers to treat via telemedicine empirically rather then risking or undertaking an in person visit at this moment.  .   I discussed the assessment and treatment plan with the patient. The patient was provided an opportunity to ask questions and all were answered. The patient agreed with the plan and demonstrated an understanding of the instructions.   The patient was advised to call back or seek an in-person evaluation if the symptoms worsen or if the condition fails to improve as anticipated.   [name], FNP

## 2021-03-27 NOTE — Telephone Encounter (Signed)
Spoke to patient daughter, [name], and advised for pt to hold Verzernio. She voiced understanding.   [place], will you please add pt to Josh's schedule on Friday. Thanks

## 2021-03-27 NOTE — Assessment & Plan Note (Addendum)
Fatigue is complicated by metastatic breast cancer, anemia, CKD, overall deconditioning, poor appetite. She will continue to follow with Dr. Tasia Catchings for infusion, chemotherapy.  We opted not to restart Megace and retrial Remeron.  In the past approximately 5 years ago patient does recall itching.  No shortness of breath on this medication.  She is amenable to retrialing this medication at a low dose.  She will let me know if itching recurs.  Discussed with daughter and patient on video today to increase appetite with low glycemic nutritional drinks such as Glucerna or Ensure.  Referral for home health to include nursing, physical therapy has been placed.  Specifically I would like nursing to help with checking blood sugar daily and physical therapy to assess home for fall safety as well as to help with strengthening exercises, balance.  Consulted with Dr Tasia Catchings via Epic and she will also address palliative care consult with patient at follow up. Close follow up.

## 2021-03-27 NOTE — Assessment & Plan Note (Signed)
Uncontrolled based on last A1c.  Concern is patient's not been checking blood sugar and she has poor appetite she is also on [place].  She denies any known hypoglycemic episodes.  She does report feeling low at times.  She is vague with this.  Emphasized the importance in regards to her safety to daughter and patient stated she must start taking fasting blood sugar in the morning and certainly spot check blood sugar if she were to feel low.  It is hard to discern when she is complaining of fatigue if this is a low blood sugar or related to her fatigue.  We may opt to further decrease [name] due to noncompliance and safety or discontinue altogether and increase Jardiance 25 mg. CrtCl -

## 2021-03-27 NOTE — Patient Instructions (Addendum)
Goal is for fasting blood sugar goal less than 140-160.   If taken after a meal, take two hours after a meal, goal is less than 200.  Recommend continued Ensure or Glucerna for nutrition.  Please continue is that she cannot eat small frequent meals throughout the day.  This will sustain /increase appetite, and prevent the risk of low blood sugar  Start ferrous sulfate once per day as iron causes constipation. You may also start over the counter stool softener- colace and this can be taken daily.   Start remeron for appetite   Referral to home health  Let us know if you dont hear back within a week in regards to an appointment being scheduled.   Please let me know of any new concerns and please call to schedule follow up with me in the next couple of weeks.

## 2021-03-28 ENCOUNTER — Ambulatory Visit: Payer: Medicare Other | Admitting: Family

## 2021-03-28 ENCOUNTER — Ambulatory Visit: Payer: Medicare Other

## 2021-03-28 DIAGNOSIS — N1832 Chronic kidney disease, stage 3b: Secondary | ICD-10-CM | POA: Diagnosis not present

## 2021-03-28 DIAGNOSIS — R8290 Unspecified abnormal findings in urine: Secondary | ICD-10-CM | POA: Diagnosis not present

## 2021-03-28 DIAGNOSIS — D631 Anemia in chronic kidney disease: Secondary | ICD-10-CM | POA: Diagnosis not present

## 2021-03-28 DIAGNOSIS — E1122 Type 2 diabetes mellitus with diabetic chronic kidney disease: Secondary | ICD-10-CM | POA: Diagnosis not present

## 2021-03-28 DIAGNOSIS — I129 Hypertensive chronic kidney disease with stage 1 through stage 4 chronic kidney disease, or unspecified chronic kidney disease: Secondary | ICD-10-CM | POA: Diagnosis not present

## 2021-03-28 DIAGNOSIS — E871 Hypo-osmolality and hyponatremia: Secondary | ICD-10-CM | POA: Diagnosis not present

## 2021-03-28 NOTE — Assessment & Plan Note (Addendum)
Lab Results  Component Value Date   HGBA1C 8.6 (A) 02/13/2021   Difficult to ascertain if controlled as we do not have home readings.  Reiterated the importance of her safety, she needs to be checking fasting blood glucose in the morning and blood sugar needs to be checked if she has a feeling that it may be low.  I am concerned that her inadequate dietary intake, calories will put her at high risk for hypoglycemic episodes.  For this reason, I have engaged home health with nursing specifically to help patient check blood sugar as well as to liver Lantus.  In the setting of CKD, she is not a candidate for metformin ( GFR 33) and as approaching GFR 30, I am hesisate to increase jardiance. Based on co morbidities and concern for hypoglycemic episode, I think is reasonable A1c to be around 8.5/9 where she currently is. A more strigent goal may be more detrimental. Close follow up.

## 2021-03-29 ENCOUNTER — Encounter: Payer: Self-pay | Admitting: Family

## 2021-03-29 ENCOUNTER — Other Ambulatory Visit: Payer: Self-pay

## 2021-03-29 DIAGNOSIS — C50919 Malignant neoplasm of unspecified site of unspecified female breast: Secondary | ICD-10-CM

## 2021-03-29 NOTE — Telephone Encounter (Signed)
I have called and spoke with patient's daughter & advised ED ASAP. She stated that her mother urinated fine yesterday, but unable to urinate today. She was take patient ED now.

## 2021-03-30 ENCOUNTER — Inpatient Hospital Stay (HOSPITAL_BASED_OUTPATIENT_CLINIC_OR_DEPARTMENT_OTHER): Payer: Medicare Other | Admitting: Oncology

## 2021-03-30 ENCOUNTER — Inpatient Hospital Stay (HOSPITAL_BASED_OUTPATIENT_CLINIC_OR_DEPARTMENT_OTHER): Payer: Medicare Other | Admitting: Hospice and Palliative Medicine

## 2021-03-30 ENCOUNTER — Inpatient Hospital Stay: Payer: Medicare Other

## 2021-03-30 ENCOUNTER — Other Ambulatory Visit: Payer: Self-pay

## 2021-03-30 ENCOUNTER — Observation Stay
Admission: EM | Admit: 2021-03-30 | Discharge: 2021-03-31 | Disposition: A | Discharge status: Home / Self Care | Payer: Medicare Other | Attending: Hospitalist | Admitting: Hospitalist

## 2021-03-30 ENCOUNTER — Emergency Department: Payer: Medicare Other

## 2021-03-30 ENCOUNTER — Telehealth: Payer: Self-pay | Admitting: Family

## 2021-03-30 ENCOUNTER — Encounter: Payer: Self-pay | Admitting: Oncology

## 2021-03-30 ENCOUNTER — Ambulatory Visit: Payer: Medicare Other

## 2021-03-30 ENCOUNTER — Encounter: Payer: Self-pay | Admitting: Internal Medicine

## 2021-03-30 VITALS — BP 111/71 | HR 61 | Temp 96.1°F | Wt 88.0 lb

## 2021-03-30 DIAGNOSIS — Z7982 Long term (current) use of aspirin: Secondary | ICD-10-CM | POA: Diagnosis not present

## 2021-03-30 DIAGNOSIS — R531 Weakness: Secondary | ICD-10-CM | POA: Diagnosis not present

## 2021-03-30 DIAGNOSIS — Z7189 Other specified counseling: Secondary | ICD-10-CM

## 2021-03-30 DIAGNOSIS — N179 Acute kidney failure, unspecified: Secondary | ICD-10-CM | POA: Diagnosis present

## 2021-03-30 DIAGNOSIS — R059 Cough, unspecified: Secondary | ICD-10-CM | POA: Diagnosis present

## 2021-03-30 DIAGNOSIS — N1831 Chronic kidney disease, stage 3a: Secondary | ICD-10-CM | POA: Diagnosis present

## 2021-03-30 DIAGNOSIS — I509 Heart failure, unspecified: Secondary | ICD-10-CM | POA: Insufficient documentation

## 2021-03-30 DIAGNOSIS — Z23 Encounter for immunization: Secondary | ICD-10-CM | POA: Diagnosis not present

## 2021-03-30 DIAGNOSIS — I130 Hypertensive heart and chronic kidney disease with heart failure and stage 1 through stage 4 chronic kidney disease, or unspecified chronic kidney disease: Secondary | ICD-10-CM | POA: Diagnosis not present

## 2021-03-30 DIAGNOSIS — N189 Chronic kidney disease, unspecified: Secondary | ICD-10-CM

## 2021-03-30 DIAGNOSIS — C50919 Malignant neoplasm of unspecified site of unspecified female breast: Secondary | ICD-10-CM | POA: Diagnosis not present

## 2021-03-30 DIAGNOSIS — J449 Chronic obstructive pulmonary disease, unspecified: Secondary | ICD-10-CM | POA: Diagnosis not present

## 2021-03-30 DIAGNOSIS — E1122 Type 2 diabetes mellitus with diabetic chronic kidney disease: Secondary | ICD-10-CM | POA: Diagnosis not present

## 2021-03-30 DIAGNOSIS — I10 Essential (primary) hypertension: Secondary | ICD-10-CM | POA: Diagnosis present

## 2021-03-30 DIAGNOSIS — E1159 Type 2 diabetes mellitus with other circulatory complications: Secondary | ICD-10-CM

## 2021-03-30 DIAGNOSIS — Z79899 Other long term (current) drug therapy: Secondary | ICD-10-CM | POA: Diagnosis not present

## 2021-03-30 DIAGNOSIS — Z853 Personal history of malignant neoplasm of breast: Secondary | ICD-10-CM | POA: Insufficient documentation

## 2021-03-30 DIAGNOSIS — Z7902 Long term (current) use of antithrombotics/antiplatelets: Secondary | ICD-10-CM | POA: Diagnosis not present

## 2021-03-30 DIAGNOSIS — K219 Gastro-esophageal reflux disease without esophagitis: Secondary | ICD-10-CM

## 2021-03-30 DIAGNOSIS — N1832 Chronic kidney disease, stage 3b: Secondary | ICD-10-CM | POA: Diagnosis not present

## 2021-03-30 DIAGNOSIS — D631 Anemia in chronic kidney disease: Secondary | ICD-10-CM | POA: Diagnosis not present

## 2021-03-30 DIAGNOSIS — D649 Anemia, unspecified: Secondary | ICD-10-CM | POA: Diagnosis present

## 2021-03-30 DIAGNOSIS — J90 Pleural effusion, not elsewhere classified: Secondary | ICD-10-CM | POA: Diagnosis not present

## 2021-03-30 DIAGNOSIS — E860 Dehydration: Secondary | ICD-10-CM

## 2021-03-30 DIAGNOSIS — Z20822 Contact with and (suspected) exposure to covid-19: Secondary | ICD-10-CM | POA: Diagnosis not present

## 2021-03-30 DIAGNOSIS — Z951 Presence of aortocoronary bypass graft: Secondary | ICD-10-CM | POA: Insufficient documentation

## 2021-03-30 DIAGNOSIS — Z170 Estrogen receptor positive status [ER+]: Secondary | ICD-10-CM

## 2021-03-30 DIAGNOSIS — Z515 Encounter for palliative care: Secondary | ICD-10-CM | POA: Diagnosis not present

## 2021-03-30 DIAGNOSIS — Z955 Presence of coronary angioplasty implant and graft: Secondary | ICD-10-CM | POA: Insufficient documentation

## 2021-03-30 DIAGNOSIS — L8990 Pressure ulcer of unspecified site, unspecified stage: Secondary | ICD-10-CM | POA: Insufficient documentation

## 2021-03-30 DIAGNOSIS — I2510 Atherosclerotic heart disease of native coronary artery without angina pectoris: Secondary | ICD-10-CM | POA: Diagnosis not present

## 2021-03-30 DIAGNOSIS — C799 Secondary malignant neoplasm of unspecified site: Secondary | ICD-10-CM

## 2021-03-30 LAB — HEPATIC FUNCTION PANEL
ALT: 167 U/L — ABNORMAL HIGH (ref 0–44)
AST: 165 U/L — ABNORMAL HIGH (ref 15–41)
Albumin: 3.1 g/dL — ABNORMAL LOW (ref 3.5–5.0)
Alkaline Phosphatase: 89 U/L (ref 38–126)
Bilirubin, Direct: 0.1 mg/dL (ref 0.0–0.2)
Indirect Bilirubin: 0.6 mg/dL (ref 0.3–0.9)
Total Bilirubin: 0.7 mg/dL (ref 0.3–1.2)
Total Protein: 6.5 g/dL (ref 6.5–8.1)

## 2021-03-30 LAB — CBC WITH DIFFERENTIAL/PLATELET
Abs Immature Granulocytes: 0.05 10*3/uL (ref 0.00–0.07)
Basophils Absolute: 0 10*3/uL (ref 0.0–0.1)
Basophils Relative: 1 %
Eosinophils Absolute: 0.1 10*3/uL (ref 0.0–0.5)
Eosinophils Relative: 1 %
HCT: 31.3 % — ABNORMAL LOW (ref 36.0–46.0)
Hemoglobin: 10.1 g/dL — ABNORMAL LOW (ref 12.0–15.0)
Immature Granulocytes: 1 %
Lymphocytes Relative: 13 %
Lymphs Abs: 1 10*3/uL (ref 0.7–4.0)
MCH: 30.3 pg (ref 26.0–34.0)
MCHC: 32.3 g/dL (ref 30.0–36.0)
MCV: 94 fL (ref 80.0–100.0)
Monocytes Absolute: 0.2 10*3/uL (ref 0.1–1.0)
Monocytes Relative: 2 %
Neutro Abs: 6.2 10*3/uL (ref 1.7–7.7)
Neutrophils Relative %: 82 %
Platelets: 231 10*3/uL (ref 150–400)
RBC: 3.33 MIL/uL — ABNORMAL LOW (ref 3.87–5.11)
RDW: 15.2 % (ref 11.5–15.5)
WBC: 7.6 10*3/uL (ref 4.0–10.5)
nRBC: 0 % (ref 0.0–0.2)

## 2021-03-30 LAB — URINALYSIS, COMPLETE (UACMP) WITH MICROSCOPIC
Bilirubin Urine: NEGATIVE
Glucose, UA: >=500 mg/dL — AB
Hgb urine dipstick: NEGATIVE
Ketones, ur: NEGATIVE mg/dL
Nitrite: NEGATIVE
Protein, ur: NEGATIVE mg/dL
Specific Gravity, Urine: 1.011 (ref 1.005–1.030)
pH: 5 (ref 5.0–8.0)

## 2021-03-30 LAB — CBC
HCT: 33.8 % — ABNORMAL LOW (ref 36.0–46.0)
Hemoglobin: 11.2 g/dL — ABNORMAL LOW (ref 12.0–15.0)
MCH: 30.7 pg (ref 26.0–34.0)
MCHC: 33.1 g/dL (ref 30.0–36.0)
MCV: 92.6 fL (ref 80.0–100.0)
Platelets: 243 10*3/uL (ref 150–400)
RBC: 3.65 MIL/uL — ABNORMAL LOW (ref 3.87–5.11)
RDW: 15 % (ref 11.5–15.5)
WBC: 8.1 10*3/uL (ref 4.0–10.5)
nRBC: 0 % (ref 0.0–0.2)

## 2021-03-30 LAB — LIPASE, BLOOD: Lipase: 40 U/L (ref 11–51)

## 2021-03-30 LAB — BASIC METABOLIC PANEL
Anion gap: 7 (ref 5–15)
BUN: 34 mg/dL — ABNORMAL HIGH (ref 8–23)
CO2: 21 mmol/L — ABNORMAL LOW (ref 22–32)
Calcium: 9 mg/dL (ref 8.9–10.3)
Chloride: 108 mmol/L (ref 98–111)
Creatinine, Ser: 1.89 mg/dL — ABNORMAL HIGH (ref 0.44–1.00)
GFR, Estimated: 25 mL/min — ABNORMAL LOW (ref >60)
Glucose, Bld: 88 mg/dL (ref 70–99)
Potassium: 4.4 mmol/L (ref 3.5–5.1)
Sodium: 136 mmol/L (ref 135–145)

## 2021-03-30 LAB — RESP PANEL BY RT-PCR (FLU A&B, COVID) ARPGX2
Influenza A by PCR: NEGATIVE
Influenza B by PCR: NEGATIVE
SARS Coronavirus 2 by RT PCR: NEGATIVE

## 2021-03-30 LAB — COMPREHENSIVE METABOLIC PANEL
ALT: 155 U/L — ABNORMAL HIGH (ref 0–44)
AST: 157 U/L — ABNORMAL HIGH (ref 15–41)
Albumin: 3 g/dL — ABNORMAL LOW (ref 3.5–5.0)
Alkaline Phosphatase: 78 U/L (ref 38–126)
Anion gap: 7 (ref 5–15)
BUN: 37 mg/dL — ABNORMAL HIGH (ref 8–23)
CO2: 17 mmol/L — ABNORMAL LOW (ref 22–32)
Calcium: 8.3 mg/dL — ABNORMAL LOW (ref 8.9–10.3)
Chloride: 105 mmol/L (ref 98–111)
Creatinine, Ser: 1.97 mg/dL — ABNORMAL HIGH (ref 0.44–1.00)
GFR, Estimated: 24 mL/min — ABNORMAL LOW (ref >60)
Glucose, Bld: 86 mg/dL (ref 70–99)
Potassium: 4.1 mmol/L (ref 3.5–5.1)
Sodium: 129 mmol/L — ABNORMAL LOW (ref 135–145)
Total Bilirubin: 0.7 mg/dL (ref 0.3–1.2)
Total Protein: 6 g/dL — ABNORMAL LOW (ref 6.5–8.1)

## 2021-03-30 LAB — CBG MONITORING, ED
Glucose-Capillary: 65 mg/dL — ABNORMAL LOW (ref 70–99)
Glucose-Capillary: 70 mg/dL (ref 70–99)

## 2021-03-30 MED ORDER — NITROGLYCERIN 0.4 MG SL SUBL: 0.4000 mg | SUBLINGUAL_TABLET | SUBLINGUAL | Status: DC | PRN

## 2021-03-30 MED ORDER — ATORVASTATIN CALCIUM 20 MG PO TABS: 40.0000 mg | ORAL_TABLET | Freq: Every day | ORAL | Status: DC

## 2021-03-30 MED ORDER — INSULIN ASPART 100 UNIT/ML IJ SOLN: 0.0000 [IU] | Freq: Three times a day (TID) | INTRAMUSCULAR | Status: DC

## 2021-03-30 MED ORDER — SODIUM CHLORIDE 0.9 % IV SOLN
Freq: Once | INTRAVENOUS | Status: AC
  Filled 2021-03-30: qty 250

## 2021-03-30 MED ORDER — HEPARIN SODIUM (PORCINE) 5000 UNIT/ML IJ SOLN: 5000.0000 [IU] | Freq: Two times a day (BID) | INTRAMUSCULAR | Status: DC

## 2021-03-30 MED ORDER — PANTOPRAZOLE SODIUM 40 MG IV SOLR
40.0000 mg | Freq: Two times a day (BID) | INTRAVENOUS | Status: DC
  Administered 2021-03-30 – 2021-03-31 (×2): 40 mg via INTRAVENOUS
  Filled 2021-03-30 (×2): qty 40

## 2021-03-30 MED ORDER — INFLUENZA VAC A&B SA ADJ QUAD 0.5 ML IM PRSY
0.5000 mL | PREFILLED_SYRINGE | INTRAMUSCULAR | Status: AC
  Administered 2021-03-31: 0.5 mL via INTRAMUSCULAR
  Filled 2021-03-30: qty 0.5

## 2021-03-30 MED ORDER — CLOPIDOGREL BISULFATE 75 MG PO TABS
75.0000 mg | ORAL_TABLET | Freq: Every day | ORAL | Status: DC
  Administered 2021-03-31: 75 mg via ORAL
  Filled 2021-03-30: qty 1

## 2021-03-30 MED ORDER — ABEMACICLIB 150 MG PO TABS: 150.0000 mg | ORAL_TABLET | Freq: Two times a day (BID) | ORAL | Status: DC

## 2021-03-30 MED ORDER — LACTATED RINGERS IV BOLUS
500.0000 mL | Freq: Once | INTRAVENOUS | Status: AC
  Administered 2021-03-30: 500 mL via INTRAVENOUS

## 2021-03-30 MED ORDER — AMLODIPINE BESYLATE 10 MG PO TABS
10.0000 mg | ORAL_TABLET | Freq: Every day | ORAL | Status: DC
  Administered 2021-03-31: 10 mg via ORAL
  Filled 2021-03-30: qty 1

## 2021-03-30 MED ORDER — SODIUM CHLORIDE 0.9 % IV SOLN: INTRAVENOUS | Status: DC

## 2021-03-30 MED ORDER — ACETAMINOPHEN 325 MG PO TABS: 650.0000 mg | ORAL_TABLET | Freq: Four times a day (QID) | ORAL | Status: DC | PRN

## 2021-03-30 MED ORDER — ACETAMINOPHEN 650 MG RE SUPP
650.0000 mg | Freq: Four times a day (QID) | RECTAL | Status: DC | PRN
  Filled 2021-03-30: qty 1

## 2021-03-30 MED ORDER — ASPIRIN EC 81 MG PO TBEC
81.0000 mg | DELAYED_RELEASE_TABLET | Freq: Every day | ORAL | Status: DC
  Administered 2021-03-31: 81 mg via ORAL
  Filled 2021-03-30: qty 1

## 2021-03-30 MED ORDER — METOPROLOL TARTRATE 25 MG PO TABS
37.5000 mg | ORAL_TABLET | Freq: Two times a day (BID) | ORAL | Status: DC
  Administered 2021-03-30 – 2021-03-31 (×2): 37.5 mg via ORAL
  Filled 2021-03-30 (×2): qty 2

## 2021-03-30 MED ORDER — CLONIDINE HCL 0.1 MG PO TABS: 0.1000 mg | ORAL_TABLET | Freq: Two times a day (BID) | ORAL | Status: DC

## 2021-03-30 MED ORDER — HYDRALAZINE HCL 10 MG PO TABS
10.0000 mg | ORAL_TABLET | Freq: Two times a day (BID) | ORAL | Status: DC
  Administered 2021-03-30: 10 mg via ORAL
  Filled 2021-03-30 (×4): qty 1

## 2021-03-30 NOTE — ED Notes (Signed)
Pt LUE appears swollen. Pt endorsing that she had her blood drawn form that side even though she had PMH mastectomy on L side. Limb alert band placed on Pt arm.

## 2021-03-30 NOTE — ED Notes (Signed)
Pt stating they have to urinate. Purewick placed on pt

## 2021-03-30 NOTE — Telephone Encounter (Signed)
FYI patient is in ED after speaking with daughter [name]. She was also already advised by Dr. Tasia Catchings to stop the [facility] for now until she sees you due to decrease in renal function.

## 2021-03-30 NOTE — Progress Notes (Signed)
[place]  Telephone:([phone] [phone]   Name: Tiffany Bailey Date: 03/30/2021 MRN: [id number]  DOB: 05/19/1928  Patient Care Team: [name], FNP as PCP - General (Family Medicine) Lauretta Grill, NP as Nurse Practitioner (Nurse Practitioner) Jason Coop, NP as Nurse Practitioner (Hospice and Palliative Medicine) [name], MD as Consulting Physician (Oncology) De Hollingshead, RPH-CPP (Pharmacist) Minna Merritts, MD as Consulting Physician (Cardiology)    REASON FOR CONSULTATION: Tiffany Bailey is a 87 y.o. female with multiple medical problems including COPD, PVD status post history of left lower extremity amputation, CKD, CAD, sickle cell disease, diabetes, hypertension, and stage IV breast cancer.  CT of the chest, abdomen, and pelvis on 03/01/2021 revealed worsening adenopathy in the chest and enlarging right-sided pleural effusion with multiple pulmonary nodules concerning for disease progression.  Patient was rotated from Arimidex to fulvestrant plus abemaciclib but has had poor tolerance of treatment.  Patient was referred to palliative care to address goals.  SOCIAL HISTORY:     reports that she has never smoked. She has never used smokeless tobacco. She reports that she does not drink alcohol and does not use drugs.  Patient is widowed.  She lives at home with her son.  She has 4 sons and 3 daughters.  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  ADVANCE DIRECTIVES:  None on file  CODE STATUS:   PAST MEDICAL HISTORY: Past Medical History:  Diagnosis Date   Anemia    Anginal pain ([place])    Arthritis    Breast cancer ([place]) 07/19/2019   CKD (chronic kidney disease), stage III ([place])    Coronary artery disease    a. 2002 CABG x 3 (LIMA-LAD, SVG-D1, SVG-RCA); b. s/p multiple PCI's.   Diabetes mellitus without complication ([place])    Diastolic  dysfunction    a. 12/2017 Echo: EF 55-60%, no rwma, Gr1 DD. Triv AI. Mild-mod MR/TR. PASP 56mHg.   Edema    Failure to thrive in adult    GERD (gastroesophageal reflux disease)    Hb-SS disease without crisis ([place] 10/23/2017   History of breast cancer    HOH (hard of hearing)    Hyperlipidemia    Hypertension    Myocardial infarction ([facility]    PAD (peripheral artery disease) [place]    a. s/p L AKA; b. 12/2018 PTA R peroneal & PTA/Stenting R SFA.   Palpitations     PAST SURGICAL HISTORY:  Past Surgical History:  Procedure Laterality Date   ABDOMINAL HYSTERECTOMY     ABOVE KNEE LEG AMPUTATION Left 2013   ANTERIOR VITRECTOMY Left 11/16/2015   Procedure: ANTERIOR VITRECTOMY;  Surgeon: BEulogio Bear MD;  Location: [facility] ORS;  Service: Ophthalmology;  Laterality: Left;   BLADDER SURGERY     CATARACT EXTRACTION W/PHACO Left 11/16/2015   Procedure: CATARACT EXTRACTION PHACO AND INTRAOCULAR LENS PLACEMENT (IOC);  Surgeon: BEulogio Bear MD;  Location: [facility] ORS;  Service: Ophthalmology;  Laterality: Left;  Lot # [id number] [place] 01:22.8 AP%:12.6 CDE: 10.46   CATARACT EXTRACTION W/PHACO Right 08/22/2016   Procedure: CATARACT EXTRACTION PHACO AND INTRAOCULAR LENS PLACEMENT (IOC);  Surgeon: BEulogio Bear MD;  Location: [facility] ORS;  Service: Ophthalmology;  Laterality: Right;  Lot # [id number] [place] 00:52.1 AP%:8.7 CDE: 4.99    CORONARY ANGIOPLASTY     STENT   CORONARY ARTERY BYPASS GRAFT     LOWER EXTREMITY ANGIOGRAPHY Right 01/14/2019  Procedure: LOWER EXTREMITY ANGIOGRAPHY;  Surgeon: Algernon Huxley, MD;  Location: [place] [facility];  Service: Cardiovascular;  Laterality: Right;   MASTECTOMY     TUMOR REMOVAL     ABDOMINAL    HEMATOLOGY/ONCOLOGY HISTORY:  Oncology History  Breast cancer ([place])  07/19/2019 Initial Diagnosis   Breast cancer ([place])   07/26/2019 - 07/26/2019 Chemotherapy   The patient had anastrozole (ARIMIDEX) 1 MG tablet, 1 mg, Oral, Daily, 0 of 1 cycle, Start date: --, End  date: -- abemaciclib (VERZENIO) 150 MG tablet, 150 mg, Oral, 2 times daily, 0 of 1 cycle, Start date: --, End date: --   for chemotherapy treatment.       ALLERGIES:  is allergic to amoxicillin, gabapentin, mirtazapine, and other.  MEDICATIONS:  Current Outpatient Medications  Medication Sig Dispense Refill   abemaciclib (VERZENIO) 150 MG tablet Take 1 tablet (150 mg total) by mouth 2 (two) times daily. Swallow tablets whole. Do not chew, crush, or split tablets before swallowing. 56 tablet 0   amLODipine (NORVASC) 10 MG tablet TAKE 1 TABLET BY MOUTH  DAILY 30 tablet 0   aspirin EC 81 MG tablet Take 1 tablet (81 mg total) by mouth daily. 30 tablet 5   atorvastatin (LIPITOR) 40 MG tablet TAKE 1 TABLET BY MOUTH  DAILY 90 tablet 3   azelastine (ASTELIN) 0.1 % nasal spray Place 1 spray into both nostrils 2 (two) times daily. Use in each nostril as directed (Patient not taking: Reported on 03/30/2021) 30 mL 4   blood glucose meter kit and supplies KIT Dispense based on patient and insurance preference. Use up to four times daily as directed. (FOR ICD-9 250.00, 250.01). 1 each 0   cloNIDine (CATAPRES) 0.1 MG tablet Take 0.1 mg by mouth 2 (two) times daily.     clopidogrel (PLAVIX) 75 MG tablet TAKE 1 TABLET BY MOUTH  DAILY 30 tablet 0   empagliflozin (JARDIANCE) 10 MG TABS tablet Take 1 tablet (10 mg total) by mouth daily before breakfast. 90 tablet 1   ferrous gluconate (FERGON) 324 MG tablet Take 324 mg by mouth 2 (two) times daily.     GARLIC PO Take by mouth.     glucose blood (ONE TOUCH TEST STRIPS) test strip Use to test blood sugar twice daily 200 each 3   hydrALAZINE (APRESOLINE) 10 MG tablet Take 1 tablet (10 mg total) by mouth in the morning and at bedtime. 180 tablet 3   insulin degludec (TRESIBA FLEXTOUCH) 100 UNIT/ML FlexTouch Pen Inject 6 Units into the skin daily. 6 mL 1   Insulin Pen Needle (BD PEN NEEDLE NANO U/F) 32G X 4 MM MISC USE 1 DAILY 100 each 12   Metoprolol Tartrate  37.5 MG TABS Take 1 tablet by mouth 2 (two) times daily.     mirtazapine (REMERON) 7.5 MG tablet Take 1 tablet (7.5 mg total) by mouth at bedtime. 90 tablet 1   mirtazapine (REMERON) 7.5 MG tablet Take by mouth. Take 7.5 mg by mouth 1 (one) time each day     nitroGLYCERIN (NITROSTAT) 0.4 MG SL tablet Place 1 tablet (0.4 mg total) under the tongue every 5 (five) minutes as needed for chest pain. 25 tablet 4   ONE TOUCH LANCETS MISC Use as directed 2 times per day 200 each 3   vitamin C (ASCORBIC ACID) 250 MG tablet Take 250 mg by mouth daily.     No current facility-administered medications for this visit.    VITAL SIGNS: There  were no vitals taken for this visit. There were no vitals filed for this visit.  Estimated body mass index is 17.19 kg/m as calculated from the following:   Height as of 03/27/21: 5' (1.524 m).   Weight as of an earlier encounter on 03/30/21: 88 lb (39.9 kg).  LABS: CBC:    Component Value Date/Time   WBC 7.6 03/30/2021 1109   HGB 10.1 (L) 03/30/2021 1109   HCT 31.3 (L) 03/30/2021 1109   PLT 231 03/30/2021 1109   MCV 94.0 03/30/2021 1109   NEUTROABS 6.2 03/30/2021 1109   LYMPHSABS 1.0 03/30/2021 1109   MONOABS 0.2 03/30/2021 1109   EOSABS 0.1 03/30/2021 1109   BASOSABS 0.0 03/30/2021 1109   Comprehensive Metabolic Panel:    Component Value Date/Time   NA 129 (L) 03/30/2021 1109   K 4.1 03/30/2021 1109   CL 105 03/30/2021 1109   CO2 17 (L) 03/30/2021 1109   BUN 37 (H) 03/30/2021 1109   CREATININE 1.97 (H) 03/30/2021 1109   CREATININE 1.18 (H) 06/19/2018 1006   GLUCOSE 86 03/30/2021 1109   CALCIUM 8.3 (L) 03/30/2021 1109   AST 157 (H) 03/30/2021 1109   ALT 155 (H) 03/30/2021 1109   ALKPHOS 78 03/30/2021 1109   BILITOT 0.7 03/30/2021 1109   PROT 6.0 (L) 03/30/2021 1109   ALBUMIN 3.0 (L) 03/30/2021 1109    RADIOGRAPHIC STUDIES: CT CHEST ABDOMEN PELVIS WO CONTRAST  Result Date: 03/02/2021 CLINICAL DATA:  History of metastatic breast cancer in  a 87 year old female EXAM: CT CHEST, ABDOMEN AND PELVIS WITHOUT CONTRAST TECHNIQUE: Multidetector CT imaging of the chest, abdomen and pelvis was performed following the standard protocol without IV contrast. COMPARISON:  October of 2021 FINDINGS: CT CHEST FINDINGS Cardiovascular: Calcified atheromatous plaque in the thoracic aorta. No aneurysmal dilation. Three-vessel coronary artery disease, abundant three-vessel disease. No substantial pericardial effusion. Normal caliber of central pulmonary vessels. Mediastinum/Nodes: Bulky precarinal/RIGHT paratracheal adenopathy 2.7 cm (image 23/7) previously approximately 1.5 cm. Bulky subcarinal lymphadenopathy (image 26/7) 2.5 cm, previously 1.9 cm. No thoracic inlet adenopathy, axillary adenopathy or internal mammary adenopathy. Smaller lymph nodes identified in the upper mediastinum on the previous study are unchanged. Lungs/Pleura: Enlarging RIGHT-sided effusion now moderate and layering posteriorly multiple pulmonary nodules. (Image 65/8) 1.9 cm greatest axial dimension, RIGHT upper lobe posterior pulmonary nodule with mixed attenuation measuring 1.9 cm previously. (Image 90/8) new 7 x 6 mm pulmonary nodule in the LEFT upper lobe along the pleural surface. (Image 97/8) nodule along an accessory fissure in the RIGHT lower lobe measuring 12 x 9 mm previously 9 mm greatest dimension. (Image 84/8) 7 mm perifissural nodule previously 3 mm. Nodularity along the confluence of fissures in the RIGHT mid chest now with increased thickness at 6 mm greatest thickness, previously 4 mm. This spans 1.8 cm of the fissure previously 1.6 cm. Enlarging Peri fissural nodule (image 46/8) 7 mm previously 2-3 mm. Areas of ground-glass attenuation in the chest in the LEFT chest and RIGHT upper lobe show a similar appearance. Airways are patent. Basilar volume loss on the RIGHT. Musculoskeletal: See below for full musculoskeletal details. CT ABDOMEN PELVIS FINDINGS Hepatobiliary: Normal liver  on noncontrast imaging. No pericholecystic stranding. Pancreas: Splenic atrophy without signs of adjacent inflammation. Spleen: Small spleen as before.  No contour abnormality. Adrenals/Urinary Tract: Adrenal glands are normal. Large RIGHT renal cyst is unchanged. Nephrolithiasis of the bilateral kidneys also similar previous imaging and vascular calcifications as well. No hydronephrosis. No perinephric stranding. Cortical scarring mild-to-moderate in greatest on  the RIGHT. Urinary bladder is moderately distended without signs of thickening or adjacent stranding. Stomach/Bowel: No acute gastrointestinal findings. Stomach is under distended limiting assessment. No small bowel dilation. Appendix not seen. No secondary signs to suggest appendiceal inflammation. Abundant stool throughout the colon with mild to moderate distension of the colon but without secondary signs of inflammation. Vascular/Lymphatic: Aortic atherosclerosis. No sign of aneurysm. Smooth contour of the IVC. There is no gastrohepatic or hepatoduodenal ligament lymphadenopathy. No retroperitoneal or mesenteric lymphadenopathy. No pelvic sidewall lymphadenopathy. Reproductive: No adnexal masses.  Post hysterectomy. Other: No ascites.  Small fat containing umbilical hernia. Musculoskeletal: Spinal degenerative changes. 40% loss of height at the L5 level in this osteopenic patient. No priors for comparison of this location since September of 2020. No surrounding stranding. Mild retropulsion of posterior cortical elements without significant canal narrowing. IMPRESSION: Worsening of disease in the chest with enlarging mediastinal adenopathy and pulmonary metastatic disease. Mixed attenuation and ground-glass nodules without change. No evidence of metastatic disease involving the abdomen or pelvis. Enlarging RIGHT-sided pleural effusion now moderate. Abundant stool in the colon, query constipation. L5 compression fracture since available imaging. Correlate  with any pain in this location. Findings are age indeterminate and associated with the proximally 40% loss of height. Bilateral nephrolithiasis and vascular calcifications. Aortic Atherosclerosis (ICD10-I70.0). Electronically Signed   By: Zetta Bills M.D.   On: 03/02/2021 11:25    PERFORMANCE STATUS (ECOG) : 3 - Symptomatic, >50% confined to bed  Review of Systems Unless otherwise noted, a complete review of systems is negative.  Physical Exam General: NAD Pulmonary: Unlabored Extremities: Left lower extremity amputation Skin: no rashes Neurological: Weakness but otherwise nonfocal  IMPRESSION: I met with patient and her son following their visit with Dr. Tasia Catchings.  Patient found to have AKI and transaminitis of unclear etiology.  It was recommended that patient go to ER for probable hospitalization but patient refused.  She is getting IV fluids today and will return to clinic next week for supportive care.  Both patient and son verbalized understanding that patient has an incurable, metastatic cancer.  The nature of palliative treatment was discussed.  Patient remains committed to ongoing care.  I note that she had a recent MOST form completed detailing a desire for full code/full scope of treatment.  I discussed this with her today.  In the context of progressive and ultimately terminal cancer, I explained the futility associated with resuscitative efforts.  Patient then stated clearly that she would not want to be resuscitated if there was little to no chance in meaningful improvement.  However, she plans to speak with her family regarding these decisions.  I encouraged patient to complete ACP documents.  She would benefit from establishing a healthcare power of attorney.  She says her daughter, [name], is most involved.  Patient is actively being followed by community palliative care  PLAN: -Continue current scope of treatment -Recommend DNR/DNI -Recommend ACP documents -Follow-up  MyChart visit 1 month  Case and plan discussed with Dr. Tasia Catchings  Patient expressed understanding and was in agreement with this plan. She also understands that She can call the clinic at any time with any questions, concerns, or complaints.     Time Total: 30 minutes  Visit consisted of counseling and education dealing with the complex and emotionally intense issues of symptom management and palliative care in the setting of serious and potentially life-threatening illness.Greater than 50%  of this time was spent counseling and coordinating care related to the above assessment  and plan.  Signed by: Altha Harm, PhD, NP-C

## 2021-03-30 NOTE — ED Triage Notes (Signed)
Pt here with weakness x3 months. Pt was just seen at her primary who said that she is very dehydrated. Pt endorses N/V/D. Pt denies pain, Pt in NAD in triage.

## 2021-03-30 NOTE — Progress Notes (Signed)
Multiple attempts for IV site on right arm. Dr Tasia Catchings gave order to use mastectomy arm if patient in agreement. Patient agreed to let us start IV in left arm. 500 ml NS over 1 hour.

## 2021-03-30 NOTE — ED Notes (Signed)
Pt had blood work done at the cancer center today.

## 2021-03-30 NOTE — Progress Notes (Signed)
Hematology/Oncology  Follow up note [facility] Telephone:([phone]) [phone] [phone]   Patient Care Team: [name], FNP as PCP - General (Family Medicine) Lauretta Grill, NP as Nurse Practitioner (Nurse Practitioner) Jason Coop, NP as Nurse Practitioner (Hospice and Palliative Medicine) [name], MD as Consulting Physician (Oncology) De Hollingshead, RPH-CPP (Pharmacist) Minna Merritts, MD as Consulting Physician (Cardiology)  REFERRING PROVIDER: [name], FNP  CHIEF COMPLAINTS/REASON FOR VISIT:  Follow up for metastatic breast cancer  HISTORY OF PRESENTING ILLNESS:   Tiffany Bailey is a  87 y.o.  female with PMH listed below was seen in consultation at the request of  [name], FNP  for evaluation of abnormal CT scan. Patient was recently evaluated by primary care provider for complaints of urinary retention, headache, constipation. 02/03/2019 x-ray of abdomen showed large colonic stool volume, nodular densities noted in the right mid lung are up). 02/08/2019 subsequent chest x-ray two-view showed hyperinflation of the lungs compatible with COPD.  Multiple nodular densities project over the mid and lower right lung. CT chest was obtained for further evaluation. 02/17/2019 CT chest without contrast Showed multiple bilateral pulmonary nodules,groundglass opacities. Largest solid nodule at the confluence of the right major and minor fissures measuring 1.6 cm.  Most notable groundglass nodule is of the right upper lobe and measure 1.8 x 0.8 cm, small right pleural effusion with associated atelectasis or consolidation.  Larger solid nodule are highly suspicious for metastatic disease. Bulky right mediastinal and hilar lymph node.  Largest pretracheal nodes measuring 3.5 x 3 cm.  There are additional bulky superior mediastinal, supraclavicular, left subpectoral lymph nodes.  Largest subpectoral nodes measuring 3.3 x 2.5  cm.  Patient was sent to cancer center for further evaluation.  #Patient reports a history of left breast cancer status post mastectomy.  She is a poor historian.  She is not able to recall the timeframe of breast cancer diagnosis and then treatments.  Denies any chemotherapy or radiation treatments.  #Left lower extremity history of amputation, CKD, History of MI, Sickle cell disease, DM, HTN, anemia.   Patient denies shortness of breath, chest pain, hemoptysis.  Night sweating, abdominal pain. Reports intentional weight loss.  Appetite is poor.  Not eating much. Also have wax and wane, chronic intermittent headache, she takes Tylenol as needed, with some relief.  Denies any nausea vomiting. She sees neurology. 11/10/2018 CT head showed no acute intracranial abnormality or significant interval changes.  Stable atrophy and white matter disease.  Atherosclerosis.  #Patient had a consultation visit with me on 02/19/2019.  At that time he was referred for abnormal CT scan.  PET scan was obtained and I recommend patient to obtain left axillary lymph node biopsy.  Patient/family called back and decided not to proceed with any additional work-up.  #February 2020 started Arimidex INTERVAL HISTORY Tiffany Bailey is a 87 y.o. female who has above history reviewed by me today presents for follow up visit for management of metastatic breast cancer. Patient was accompanied by her son Tiffany Bailey.  Patient was seen by primary care provider recently.  She was found to have declining functional status, weakness, decreased appetite and p.o. intake.  She has had increased cough which was felt to be secondary to her breast cancer progression.  CT in January showed progression in the lungs.  She has no shortness of breath.  She reports that her weakness/decreased appetite started about a month ago. Patient started on Jardiance in the  beginning of September 2022 for better control of her blood glucose. For her breast cancer  treatment, she also was started on fulvestrant injections and abemaciclib which she only took for few days and we suggested her to stop due to her declining functional status. She denies any fever, dysuria or increased frequency/urgency seen.   Review of Systems  Constitutional:  Positive for appetite change, fatigue and unexpected weight change. Negative for chills and fever.  HENT:   Negative for hearing loss and voice change.   Eyes:  Negative for eye problems.  Respiratory:  Positive for cough. Negative for chest tightness and shortness of breath.   Cardiovascular:  Negative for chest pain.  Gastrointestinal:  Negative for abdominal distention, abdominal pain, blood in stool and constipation.  Endocrine: Negative for hot flashes.  Genitourinary:  Negative for difficulty urinating and frequency.   Musculoskeletal:  Negative for arthralgias.       Left lower extremity history of amputation, intermittent sharp pain  Skin:  Negative for itching and rash.  Neurological:  Negative for extremity weakness and headaches.  Hematological:  Negative for adenopathy.  Psychiatric/Behavioral:  Negative for confusion.        Sleeps a lot during the day.   MEDICAL HISTORY:  Past Medical History:  Diagnosis Date   Anemia    Anginal pain ([place])    Arthritis    Breast cancer ([place]) 07/19/2019   CKD (chronic kidney disease), stage III ([place])    Coronary artery disease    a. 2002 CABG x 3 (LIMA-LAD, SVG-D1, SVG-RCA); b. s/p multiple PCI's.   Diabetes mellitus without complication ([place])    Diastolic dysfunction    a. 12/2017 Echo: EF 55-60%, no rwma, Gr1 DD. Triv AI. Mild-mod MR/TR. PASP 35mHg.   Edema    Failure to thrive in adult    GERD (gastroesophageal reflux disease)    Hb-SS disease without crisis [place] 10/23/2017   History of breast cancer    HOH (hard of hearing)    Hyperlipidemia    Hypertension    Myocardial infarction ([facility]    PAD [place]ipheral artery disease) [place]    a. s/p L AKA;  b. 12/2018 PTA R peroneal & PTA/Stenting R SFA.   Palpitations     SURGICAL HISTORY: Past Surgical History:  Procedure Laterality Date   ABDOMINAL HYSTERECTOMY     ABOVE KNEE LEG AMPUTATION Left 2013   ANTERIOR VITRECTOMY Left 11/16/2015   Procedure: ANTERIOR VITRECTOMY;  Surgeon: BEulogio Bear MD;  Location: [facility] ORS;  Service: Ophthalmology;  Laterality: Left;   BLADDER SURGERY     CATARACT EXTRACTION W/PHACO Left 11/16/2015   Procedure: CATARACT EXTRACTION PHACO AND INTRAOCULAR LENS PLACEMENT (IOC);  Surgeon: BEulogio Bear MD;  Location: [facility] ORS;  Service: Ophthalmology;  Laterality: Left;  Lot # [id number] [place] 01:22.8 AP%:12.6 CDE: 10.46   CATARACT EXTRACTION W/PHACO Right 08/22/2016   Procedure: CATARACT EXTRACTION PHACO AND INTRAOCULAR LENS PLACEMENT (IOC);  Surgeon: BEulogio Bear MD;  Location: [facility] ORS;  Service: Ophthalmology;  Laterality: Right;  Lot # [id number] [place] 00:52.1 AP%:8.7 CDE: 4.99    CORONARY ANGIOPLASTY     STENT   CORONARY ARTERY BYPASS GRAFT     LOWER EXTREMITY ANGIOGRAPHY Right 01/14/2019   Procedure: LOWER EXTREMITY ANGIOGRAPHY;  Surgeon: DAlgernon Huxley MD;  [facility];  Service: Cardiovascular;  Laterality: Right;   MASTECTOMY     TUMOR REMOVAL     ABDOMINAL    SOCIAL HISTORY: Social History  Socioeconomic History   Marital status: Divorced    Spouse name: Not on file   Number of children: 7   Years of education: Not on file   Highest education level: Not on file  Occupational History   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  Tobacco Use   Smoking status: Never   Smokeless tobacco: Never  Substance and Sexual Activity   Alcohol use: No   Drug use: No   Sexual activity: Not Currently  Other Topics Concern   Not on file  Social History Narrative   Not on file   Social Determinants of Health   Financial Resource Strain: Low Risk    Difficulty of Paying Living Expenses: Not hard at all  Food  Insecurity: Not on file  Transportation Needs: Not on file  Physical Activity: Not on file  Stress: Not on file  Social Connections: Not on file  Intimate Partner Violence: Not on file    FAMILY HISTORY: Family History  Problem Relation Age of Onset   Sudden death Mother    Arthritis Father    Stroke Father    Breast cancer Sister    Diabetes Grandchild     ALLERGIES:  is allergic to amoxicillin, gabapentin, mirtazapine, and other.  MEDICATIONS:  Current Outpatient Medications  Medication Sig Dispense Refill   abemaciclib (VERZENIO) 150 MG tablet Take 1 tablet (150 mg total) by mouth 2 (two) times daily. Swallow tablets whole. Do not chew, crush, or split tablets before swallowing. 56 tablet 0   amLODipine (NORVASC) 10 MG tablet TAKE 1 TABLET BY MOUTH  DAILY 30 tablet 0   aspirin EC 81 MG tablet Take 1 tablet (81 mg total) by mouth daily. 30 tablet 5   atorvastatin (LIPITOR) 40 MG tablet TAKE 1 TABLET BY MOUTH  DAILY 90 tablet 3   blood glucose meter kit and supplies KIT Dispense based on patient and insurance preference. Use up to four times daily as directed. (FOR ICD-9 250.00, 250.01). 1 each 0   cloNIDine (CATAPRES) 0.1 MG tablet Take 0.1 mg by mouth 2 (two) times daily.     clopidogrel (PLAVIX) 75 MG tablet TAKE 1 TABLET BY MOUTH  DAILY 30 tablet 0   empagliflozin (JARDIANCE) 10 MG TABS tablet Take 1 tablet (10 mg total) by mouth daily before breakfast. 90 tablet 1   ferrous gluconate (FERGON) 324 MG tablet Take 324 mg by mouth 2 (two) times daily.     GARLIC PO Take by mouth.     glucose blood (ONE TOUCH TEST STRIPS) test strip Use to test blood sugar twice daily 200 each 3   hydrALAZINE (APRESOLINE) 10 MG tablet Take 1 tablet (10 mg total) by mouth in the morning and at bedtime. 180 tablet 3   insulin degludec (TRESIBA FLEXTOUCH) 100 UNIT/ML FlexTouch Pen Inject 6 Units into the skin daily. 6 mL 1   Insulin Pen Needle (BD PEN NEEDLE NANO U/F) 32G X 4 MM MISC USE 1 DAILY 100  each 12   Metoprolol Tartrate 37.5 MG TABS Take 1 tablet by mouth 2 (two) times daily.     mirtazapine (REMERON) 7.5 MG tablet Take 1 tablet (7.5 mg total) by mouth at bedtime. 90 tablet 1   mirtazapine (REMERON) 7.5 MG tablet Take by mouth. Take 7.5 mg by mouth 1 (one) time each day     nitroGLYCERIN (NITROSTAT) 0.4 MG SL tablet Place 1 tablet (0.4 mg total) under the tongue every 5 (five) minutes as  needed for chest pain. 25 tablet 4   ONE TOUCH LANCETS MISC Use as directed 2 times per day 200 each 3   vitamin C (ASCORBIC ACID) 250 MG tablet Take 250 mg by mouth daily.     azelastine (ASTELIN) 0.1 % nasal spray Place 1 spray into both nostrils 2 (two) times daily. Use in each nostril as directed (Patient not taking: Reported on 03/30/2021) 30 mL 4   No current facility-administered medications for this visit.     PHYSICAL EXAMINATION: ECOG PERFORMANCE STATUS: 2 - Symptomatic, <50% confined to bed Vitals:   03/30/21 1126  BP: 111/71  Pulse: 61  Temp: (!) 96.1 F (35.6 C)  SpO2: 100%   Filed Weights   03/30/21 1126  Weight: 88 lb (39.9 kg)    Physical Exam Constitutional:      General: She is not in acute distress.    Comments: She sits in the wheel chair.  Frail appearance  HENT:     Head: Normocephalic and atraumatic.  Eyes:     General: No scleral icterus.    Pupils: Pupils are equal, round, and reactive to light.  Cardiovascular:     Rate and Rhythm: Normal rate and regular rhythm.     Heart sounds: Normal heart sounds.  Pulmonary:     Effort: Pulmonary effort is normal. No respiratory distress.     Breath sounds: No wheezing.     Comments: Decreased breath sound right lower lobe.  Good air entry on the left base.  Abdominal:     General: Bowel sounds are normal. There is no distension.     Palpations: Abdomen is soft.  Musculoskeletal:        General: No deformity. Normal range of motion.     Cervical back: Normal range of motion and neck supple.     Comments:  Left lower extremity history of amputation.   Skin:    General: Skin is warm and dry.     Findings: No erythema or rash.  Neurological:     Mental Status: She is alert and oriented to person, place, and time. Mental status is at baseline.     Cranial Nerves: No cranial nerve deficit.     Coordination: Coordination normal.  Psychiatric:        Mood and Affect: Mood normal.     LABORATORY DATA:  I have reviewed the data as listed Lab Results  Component Value Date   WBC 7.6 03/30/2021   HGB 10.1 (L) 03/30/2021   HCT 31.3 (L) 03/30/2021   MCV 94.0 03/30/2021   PLT 231 03/30/2021   Recent Labs    01/12/21 1133 01/20/21 1830 02/13/21 1226 03/14/21 1416 03/30/21 1109  NA 130* 126* 134* 136 129*  K 4.3 4.6 4.3 3.6 4.1  CL 105 101 104 111 105  CO2 18* 21* 22 18* 17*  GLUCOSE 200* 190* 173* 166* 86  BUN 27* 25* 24* 35* 37*  CREATININE 1.20* 1.15* 1.30* 1.47* 1.97*  CALCIUM 8.4* 8.5* 9.0 8.6* 8.3*  GFRNONAA 43* 45*  --  33* 24*  PROT 5.9*  --   --  6.6 6.0*  ALBUMIN 3.0*  --   --  3.3* 3.0*  AST 27  --   --  211* 157*  ALT 21  --   --  109* 155*  ALKPHOS 85  --   --  87 78  BILITOT 0.2*  --   --  0.5 0.7    Iron/TIBC/Ferritin/ %Sat  Component Value Date/Time   IRON 33 01/12/2021 1133   TIBC 220 (L) 01/12/2021 1133   FERRITIN 74 01/12/2021 1133   IRONPCTSAT 15 01/12/2021 1133   IRONPCTSAT 27 06/19/2018 1006      RADIOGRAPHIC STUDIES: I have personally reviewed the radiological images as listed and agreed with the findings in the report. DG Chest 2 View  Result Date: 02/14/2021 CLINICAL DATA:  Persistent cough EXAM: CHEST - 2 VIEW COMPARISON:  CT from 03/21/2020 FINDINGS: Cardiac shadow is within normal limits. Postsurgical changes are noted. Lungs are well aerated bilaterally. No focal infiltrate is seen. Small right-sided pleural effusion is noted stable from the prior exam with minimal basilar atelectasis. Nodular changes are noted in the right mid lung and right  lung base increased from the prior CT examination. Repeat CT is recommended for follow-up. No bony abnormality is seen. IMPRESSION: Increase in nodular changes within the right lung when compare with the prior exam likely related to increasing metastatic disease. CT of the chest with contrast is recommended for further evaluation. These results will be called to the ordering clinician or representative by the Radiologist Assistant, and communication documented in the PACS or [employer]. Electronically Signed   By: [name] M.D.   On: 02/14/2021 09:56   CT Head Wo Contrast  Result Date: 01/07/2021 CLINICAL DATA:  New or worsening headache. EXAM: CT HEAD WITHOUT CONTRAST TECHNIQUE: Contiguous axial images were obtained from the base of the skull through the vertex without intravenous contrast. COMPARISON:  Oct 19, 2019 FINDINGS: Brain: No subdural, epidural, or subarachnoid hemorrhage. Ventricles and sulci are stable. Cerebellum, brainstem, and basal cisterns are normal. Scattered white matter changes again identified. No acute cortical ischemia or infarct. No mass effect or midline shift. Vascular: Calcified atherosclerosis in the intracranial carotids. Skull: Normal. Negative for fracture or focal lesion. Sinuses/Orbits: No acute finding. Other: None. IMPRESSION: No acute intracranial abnormalities are identified to explain the patient's headache. Electronically Signed   By: Dorise Bullion III M.D   On: 01/07/2021 12:52   CT CHEST ABDOMEN PELVIS WO CONTRAST  Result Date: 03/02/2021 CLINICAL DATA:  History of metastatic breast cancer in a 87 year old female EXAM: CT CHEST, ABDOMEN AND PELVIS WITHOUT CONTRAST TECHNIQUE: Multidetector CT imaging of the chest, abdomen and pelvis was performed following the standard protocol without IV contrast. COMPARISON:  October of 2021 FINDINGS: CT CHEST FINDINGS Cardiovascular: Calcified atheromatous plaque in the thoracic aorta. No aneurysmal dilation.  Three-vessel coronary artery disease, abundant three-vessel disease. No substantial pericardial effusion. Normal caliber of central pulmonary vessels. Mediastinum/Nodes: Bulky precarinal/RIGHT paratracheal adenopathy 2.7 cm (image 23/7) previously approximately 1.5 cm. Bulky subcarinal lymphadenopathy (image 26/7) 2.5 cm, previously 1.9 cm. No thoracic inlet adenopathy, axillary adenopathy or internal mammary adenopathy. Smaller lymph nodes identified in the upper mediastinum on the previous study are unchanged. Lungs/Pleura: Enlarging RIGHT-sided effusion now moderate and layering posteriorly multiple pulmonary nodules. (Image 65/8) 1.9 cm greatest axial dimension, RIGHT upper lobe posterior pulmonary nodule with mixed attenuation measuring 1.9 cm previously. (Image 90/8) new 7 x 6 mm pulmonary nodule in the LEFT upper lobe along the pleural surface. (Image 97/8) nodule along an accessory fissure in the RIGHT lower lobe measuring 12 x 9 mm previously 9 mm greatest dimension. (Image 84/8) 7 mm perifissural nodule previously 3 mm. Nodularity along the confluence of fissures in the RIGHT mid chest now with increased thickness at 6 mm greatest thickness, previously 4 mm. This spans 1.8 cm of the fissure previously 1.6 cm. Enlarging  Peri fissural nodule (image 46/8) 7 mm previously 2-3 mm. Areas of ground-glass attenuation in the chest in the LEFT chest and RIGHT upper lobe show a similar appearance. Airways are patent. Basilar volume loss on the RIGHT. Musculoskeletal: See below for full musculoskeletal details. CT ABDOMEN PELVIS FINDINGS Hepatobiliary: Normal liver on noncontrast imaging. No pericholecystic stranding. Pancreas: Splenic atrophy without signs of adjacent inflammation. Spleen: Small spleen as before.  No contour abnormality. Adrenals/Urinary Tract: Adrenal glands are normal. Large RIGHT renal cyst is unchanged. Nephrolithiasis of the bilateral kidneys also similar previous imaging and vascular  calcifications as well. No hydronephrosis. No perinephric stranding. Cortical scarring mild-to-moderate in greatest on the RIGHT. Urinary bladder is moderately distended without signs of thickening or adjacent stranding. Stomach/Bowel: No acute gastrointestinal findings. Stomach is under distended limiting assessment. No small bowel dilation. Appendix not seen. No secondary signs to suggest appendiceal inflammation. Abundant stool throughout the colon with mild to moderate distension of the colon but without secondary signs of inflammation. Vascular/Lymphatic: Aortic atherosclerosis. No sign of aneurysm. Smooth contour of the IVC. There is no gastrohepatic or hepatoduodenal ligament lymphadenopathy. No retroperitoneal or mesenteric lymphadenopathy. No pelvic sidewall lymphadenopathy. Reproductive: No adnexal masses.  Post hysterectomy. Other: No ascites.  Small fat containing umbilical hernia. Musculoskeletal: Spinal degenerative changes. 40% loss of height at the L5 level in this osteopenic patient. No priors for comparison of this location since September of 2020. No surrounding stranding. Mild retropulsion of posterior cortical elements without significant canal narrowing. IMPRESSION: Worsening of disease in the chest with enlarging mediastinal adenopathy and pulmonary metastatic disease. Mixed attenuation and ground-glass nodules without change. No evidence of metastatic disease involving the abdomen or pelvis. Enlarging RIGHT-sided pleural effusion now moderate. Abundant stool in the colon, query constipation. L5 compression fracture since available imaging. Correlate with any pain in this location. Findings are age indeterminate and associated with the proximally 40% loss of height. Bilateral nephrolithiasis and vascular calcifications. Aortic Atherosclerosis (ICD10-I70.0). Electronically Signed   By: Zetta Bills M.D.   On: 03/02/2021 11:25      ASSESSMENT & PLAN:  1. Weakness   2. Pleural effusion    3. Malignant neoplasm of breast in female, estrogen receptor positive, unspecified laterality, unspecified site of breast ([place])   4. Goals of care, counseling/discussion    #Acute kidney failure  Today labs showed acute worsening of her kidney function, creatinine 1.9. This could be combination of decreased appetite/volume depletion. I also suspect that this could be a side effect of Jardiance. Recommend patient to go to emergency room for continuous hydration.  Patient adamantly declined.  We will give 500 cc of IV fluid in the clinic today.  She has very poor IV access.  #Metastatic breast cancer, ER+, PR- HER2 negative.  Disease progression predominantly in her lungs/thoracic lymphadenopathy.  No significant disease in abdomen pelvis. She has been switched to fulvestrant injections. Hold abemaciclib.  #Pleural effusion, moderate.  She denies shortness of breath.  Monitor symptoms. #Transaminitis, etiology unknown.?  Medication side effects. She has had elevated LFTs even before starting her abemaciclib.  Questionably secondary to Jardiance.  #Weakness, likely due to dehydration. Check UA urine culture.  # uncontrolled diabetes.follow up with pcp. Recently started on Jardiance.  Glucose has improved.  Hold Jardiance due to above.  I had a lengthy discussion with both patient and son about goals of care.  Patient has stage IV breast cancer which is an incurable disease.  She may not be candidate for additional treatment if her  functional status continues to decrease like this.  She will see palliative care service today.  After her visit, I got a request from daughter [name] who will want to call back.  I had a lengthy discussion with her again and updated her.  Patient's daughter will try to persuade her mother to go to emergency room for treatment of AKI.  All questions were answered. The patient knows to call the clinic with any problems questions or concerns. Return of visit: She  will follow-up next week for lab MD IV fluid/NP.  Follow-up with me in 2 weeks.  [name], MD, PhD 03/30/2021

## 2021-03-30 NOTE — Telephone Encounter (Signed)
noted 

## 2021-03-30 NOTE — H&P (Signed)
History and Physical    Tiffany Bailey KGU:[id number] DOB: Mar 24, 1928 DOA: 03/30/2021  PCP: [name], FNP    Patient coming from:  Home    Chief Complaint:  Abnormal Labs.    HPI: Tiffany Bailey is a 87 y.o. female seen in ed with complaints of abnormal labs and sent from oncology office.  Patient reported to be having declining functional status weakness decreased appetite decreased p.o. intake.  Patient having persistent and worsening cough since February cough attributed to cancer progression.  Patient recently started on Jardiance for optimizing diabetes treatment in September 2022.ROS is otherwise negative.   Pt has past medical history of anemia, heart disease, heart failure, GERD, hypertension, PAD, breast cancer, chronic kidney disease, COPD.  ED Course:  Vitals:   03/30/21 1600 03/30/21 1739 03/30/21 1800 03/30/21 1830  BP: (!) 152/45 (!) 163/46 (!) 145/43 (!) 144/43  Pulse: (!) 57  69 74  Resp: 13 15 (!) 21 18  Temp:      TempSrc:      SpO2: 100%  100% 100%  Weight:      In ed pt is afebrile, bradycardic and hypertensive with bp is 140's -160's. Also her creatinine is 1.30 at baseline and is currently 1.89.  See trend below: Recent Labs    01/12/21 1133 01/20/21 1830 02/13/21 1226 03/14/21 1416 03/30/21 1109  NA 130* 126* 134* 136 129*  K 4.3 4.6 4.3 3.6 4.1  CL 105 101 104 111 105  CO2 18* 21* 22 18* 17*  GLUCOSE 200* 190* 173* 166* 86  BUN 27* 25* 24* 35* 37*  CREATININE 1.20* 1.15* 1.30* 1.47* 1.97*  CALCIUM 8.4* 8.5* 9.0 8.6* 8.3*  GFRNONAA 43* 45*  --  33* 24*  PROT 5.9*  --   --  6.6 6.0*  ALBUMIN 3.0*  --   --  3.3* 3.0*  AST 27  --   --  211* 157*  ALT 21  --   --  109* 155*  ALKPHOS 85  --   --  87 78  BILITOT 0.2*  --   --  0.5 0.7  CBC shows a hemoglobin of 11.2, normal platelet and normal white count, LFTs show AST of 155 and ALT of 157.  In the ED patient received half a liter of LR.  Hospitalist asked admit patient for abnormal  labs acute kidney injury transaminitis.  Review of Systems:  Review of Systems  Constitutional:  Positive for malaise/fatigue.  Neurological:  Positive for weakness.  All other systems reviewed and are negative.   Past Medical History:  Diagnosis Date   Anemia    Anginal pain ([place])    Arthritis    Breast cancer ([place]) 07/19/2019   CKD (chronic kidney disease), stage III ([place])    Coronary artery disease    a. 2002 CABG x 3 (LIMA-LAD, SVG-D1, SVG-RCA); b. s/p multiple PCI's.   Diabetes mellitus without complication ([place])    Diastolic dysfunction    a. 12/2017 Echo: EF 55-60%, no rwma, Gr1 DD. Triv AI. Mild-mod MR/TR. PASP 104mHg.   Edema    Failure to thrive in adult    GERD (gastroesophageal reflux disease)    Hb-SS disease without crisis [place] 10/23/2017   History of breast cancer    HOH (hard of hearing)    Hyperlipidemia    Hypertension    Myocardial infarction ([facility]    PAD (peripheral artery disease) [place]    a. s/p L AKA; b. 12/2018 PTA  R peroneal & PTA/Stenting R SFA.   Palpitations     Past Surgical History:  Procedure Laterality Date   ABDOMINAL HYSTERECTOMY     ABOVE KNEE LEG AMPUTATION Left 2013   ANTERIOR VITRECTOMY Left 11/16/2015   Procedure: ANTERIOR VITRECTOMY;  Surgeon: Eulogio Bear, MD;  Location: [facility] ORS;  Service: Ophthalmology;  Laterality: Left;   BLADDER SURGERY     CATARACT EXTRACTION W/PHACO Left 11/16/2015   Procedure: CATARACT EXTRACTION PHACO AND INTRAOCULAR LENS PLACEMENT (IOC);  Surgeon: Eulogio Bear, MD;  Location: [facility] ORS;  Service: Ophthalmology;  Laterality: Left;  Lot # [id number] H [place]; 01:22.8 AP%:12.6 CDE: 10.46   CATARACT EXTRACTION W/PHACO Right 08/22/2016   Procedure: CATARACT EXTRACTION PHACO AND INTRAOCULAR LENS PLACEMENT (IOC);  Surgeon: Eulogio Bear, MD;  Location: [facility] ORS;  Service: Ophthalmology;  Laterality: Right;  Lot # [id number] H [place]: 00:52.1 AP%:8.7 CDE: 4.99    CORONARY ANGIOPLASTY     STENT   CORONARY ARTERY  BYPASS GRAFT     LOWER EXTREMITY ANGIOGRAPHY Right 01/14/2019   Procedure: LOWER EXTREMITY ANGIOGRAPHY;  Surgeon: Algernon Huxley, MD;  Location: [facility];  Service: Cardiovascular;  Laterality: Right;   MASTECTOMY     TUMOR REMOVAL     ABDOMINAL     reports that she has never smoked. She has never used smokeless tobacco. She reports that she does not drink alcohol and does not use drugs.  Allergies  Allergen Reactions   Amoxicillin Hives and Nausea Only   Gabapentin Other (See Comments)    AMS Confusion   Mirtazapine Itching    Dry mouth with ODT Denies SOB with medication   Other     UNKNOWN PAIN MEDICATION - unknown reaction    Family History  Problem Relation Age of Onset   Sudden death Mother    Arthritis Father    Stroke Father    Breast cancer Sister    Diabetes Grandchild     Prior to Admission medications   Medication Sig Start Date End Date Taking? Authorizing Provider  abemaciclib (VERZENIO) 150 MG tablet Take 1 tablet (150 mg total) by mouth 2 (two) times daily. Swallow tablets whole. Do not chew, crush, or split tablets before swallowing. 03/19/21   [name], MD  amLODipine (NORVASC) 10 MG tablet TAKE 1 TABLET BY MOUTH  DAILY 01/05/21   Minna Merritts, MD  aspirin EC 81 MG tablet Take 1 tablet (81 mg total) by mouth daily. 01/15/17   [name], DO  atorvastatin (LIPITOR) 40 MG tablet TAKE 1 TABLET BY MOUTH  DAILY 05/25/20   [name], FNP  azelastine (ASTELIN) 0.1 % nasal spray Place 1 spray into both nostrils 2 (two) times daily. Use in each nostril as directed Patient not taking: Reported on 03/30/2021 02/13/21   [name], FNP  blood glucose meter kit and supplies KIT Dispense based on patient and insurance preference. Use up to four times daily as directed. (FOR ICD-9 250.00, 250.01). 01/13/17   [name], DO  cloNIDine (CATAPRES) 0.1 MG tablet Take 0.1 mg by mouth 2 (two) times daily. 01/07/21   [provider]   clopidogrel (PLAVIX) 75 MG tablet TAKE 1 TABLET BY MOUTH  DAILY 01/05/21   Minna Merritts, MD  ferrous gluconate (FERGON) 324 MG tablet Take 324 mg by mouth 2 (two) times daily. 01/08/21   [provider]  GARLIC PO Take by mouth.    [provider]  glucose blood (  ONE TOUCH TEST STRIPS) test strip Use to test blood sugar twice daily 09/17/17   [name], FNP  hydrALAZINE (APRESOLINE) 10 MG tablet Take 1 tablet (10 mg total) by mouth in the morning and at bedtime. 03/02/21   [name], [name], PA-C  insulin degludec (TRESIBA FLEXTOUCH) 100 UNIT/ML FlexTouch Pen Inject 6 Units into the skin daily. 02/25/21   [name], FNP  Insulin Pen Needle (BD PEN NEEDLE NANO U/F) 32G X 4 MM MISC USE 1 DAILY 08/03/18   [name], FNP  Metoprolol Tartrate 37.5 MG TABS Take 1 tablet by mouth 2 (two) times daily. 12/22/20   [provider]  mirtazapine (REMERON) 7.5 MG tablet Take 1 tablet (7.5 mg total) by mouth at bedtime. 03/27/21   [name], FNP  mirtazapine (REMERON) 7.5 MG tablet Take by mouth. Take 7.5 mg by mouth 1 (one) time each day 03/27/21   [provider]  nitroGLYCERIN (NITROSTAT) 0.4 MG SL tablet Place 1 tablet (0.4 mg total) under the tongue every 5 (five) minutes as needed for chest pain. 12/19/17   Minna Merritts, MD  ONE TOUCH LANCETS MISC Use as directed 2 times per day 09/17/17   [name], FNP  vitamin C (ASCORBIC ACID) 250 MG tablet Take 250 mg by mouth daily.    [provider]    Physical Exam: Vitals:   03/30/21 1600 03/30/21 1739 03/30/21 1800 03/30/21 1830  BP: (!) 152/45 (!) 163/46 (!) 145/43 (!) 144/43  Pulse: (!) 57  69 74  Resp: 13 15 (!) 21 18  Temp:      TempSrc:      SpO2: 100%  100% 100%  Weight:       Physical Exam Vitals and nursing note reviewed.  Constitutional:      General: She is not in acute distress.    Appearance: Normal appearance. She is not ill-appearing.  HENT:      Head: Normocephalic and atraumatic.     Right Ear: External ear normal.     Left Ear: External ear normal.     Nose: Nose normal.     Mouth/Throat:     Mouth: Mucous membranes are moist.  Eyes:     Extraocular Movements: Extraocular movements intact.     Pupils: Pupils are equal, round, and reactive to light.  Neck:     Vascular: No carotid bruit.  Cardiovascular:     Rate and Rhythm: Normal rate and regular rhythm.     Pulses: Normal pulses.     Heart sounds: Normal heart sounds.  Pulmonary:     Effort: Pulmonary effort is normal.     Breath sounds: Normal breath sounds.  Abdominal:     General: Bowel sounds are normal.     Palpations: Abdomen is soft.  Musculoskeletal:     Right lower leg: No edema.     Left lower leg: No edema.  Skin:    General: Skin is warm.  Neurological:     General: No focal deficit present.     Mental Status: She is alert and oriented to person, place, and time.  Psychiatric:        Mood and Affect: Mood normal.        Behavior: Behavior normal.     Labs on Admission: I have personally reviewed following labs and imaging studies  No results for input(s): CKTOTAL, CKMB, TROPONINI in the last 72 hours. Lab Results  Component Value Date  WBC 8.1 03/30/2021   HGB 11.2 (L) 03/30/2021   HCT 33.8 (L) 03/30/2021   MCV 92.6 03/30/2021   PLT 243 03/30/2021    Recent Labs  Lab 03/30/21 1649 03/30/21 1650  NA 136  --   K 4.4  --   CL 108  --   CO2 21*  --   BUN 34*  --   CREATININE 1.89*  --   CALCIUM 9.0  --   PROT  --  6.5  BILITOT  --  0.7  ALKPHOS  --  89  ALT  --  167*  AST  --  165*  GLUCOSE 88  --    Lab Results  Component Value Date   CHOL 136 02/26/2019   HDL 46.50 02/26/2019   LDLCALC 76 02/26/2019   TRIG 63.0 02/26/2019   No results found for: DDIMER Invalid input(s): POCBNP  Urinalysis    Component Value Date/Time   COLORURINE YELLOW (A) 03/30/2021 1430   APPEARANCEUR HAZY (A) 03/30/2021 1430   APPEARANCEUR Clear  03/05/2019 1210   LABSPEC 1.011 03/30/2021 1430   PHURINE 5.0 03/30/2021 1430   GLUCOSEU >=500 (A) 03/30/2021 1430   HGBUR NEGATIVE 03/30/2021 1430   BILIRUBINUR NEGATIVE 03/30/2021 1430   BILIRUBINUR Negative 03/05/2019 1210   KETONESUR NEGATIVE 03/30/2021 1430   PROTEINUR NEGATIVE 03/30/2021 1430   NITRITE NEGATIVE 03/30/2021 1430   LEUKOCYTESUR TRACE (A) 03/30/2021 1430   COVID-19 Labs No results for input(s): DDIMER, FERRITIN, LDH, CRP in the last 72 hours. Lab Results  Component Value Date   [place] 03/30/2021   [place] NEGATIVE 01/12/2019    Radiological Exams on Admission: No results found.  EKG: Independently reviewed.  SR 61, TWI in ii,ii,avf, twi in v5.v6.  Assessment/Plan Principal Problem:   Generalized weakness Active Problems:   Acute kidney injury superimposed on chronic kidney disease (HCC)   Cough   Essential hypertension   Anemia due to stage 3a chronic kidney disease (HCC)   CAD (coronary artery disease)   GERD (gastroesophageal reflux disease)   Generalized weakness: Attribute to multiple etiologies and patient differentials include dehydration and decreased p.o. intake, chronic anemia, statin myalgia, possible underlying vitamin D deficiency, generalized deconditioning due to age. We will admit patient to med telemetry unit as patient is full code and monitor cardiac rhythm for any dysrhythmias. We will fall precautions, aspiration precautions.  Acute kidney injury on chronic kidney disease: Due to combination of medication i.e. Jardiance which is new, and decreased p.o. intake.   Lab Results  Component Value Date   CREATININE 1.89 (H) 03/30/2021   CREATININE 1.97 (H) 03/30/2021   CREATININE 1.47 (H) 03/14/2021  Patient's baseline creatinine is 1.3-1.4, we will continue patient on gentle IV maintenance fluid for hydration overnight. Monitor I's and O's. Avoid any contrast studies and renally dose all needed  medications.  Cough: During my exam patient was able to speak in complete sentences is not hypoxic has not coughed in the 30 minutes that I was with the patient obtaining history and physical exam.  Chest x-ray done today is negative for any infiltrate or effusion, fractures or pneumothorax. As needed Delsym.  Hypertension: Blood pressure (!) 144/43, pulse 74, temperature 98.3 F (36.8 C), temperature source Oral, resp. rate 18, weight 36.7 kg, SpO2 100 %. We will continue patient's home regimen with clonidine, amlodipine, metoprolol.  Anemia due to his CKD: Chart review shows patient has anemia due to chronic chronic kidney disease. Patient has been anemic since October 2017  thus far as I can tell.  Currently hemoglobin is 11.2 and is stable patient has maintained above 10 since March 2021.  Patient is currently being followed by hematology oncology.  Type and screen, IV PPI.  Stool guaiac.  CAD: We will continue patient on her aspirin, metoprolol. We will hold patient's atorvastatin and obtain a CPK and hydrate if resolution of statin induced myalgia is also very likely cause for the patient's generalized weakness.   GERD: IV PPI therapy.  DVT prophylaxis:  scd's  Code Status:  Full code    Family Communication:  Ratliff,[name] F (Daughter)  [phone] (Mobile)   Disposition Plan:  Home    Consults called:  None.  Admission status: Observation.     Para Skeans MD [facility] [phone] How to contact the [facility] Attending or Consulting provider [place] or covering provider during after hours [place], for this patient.    Check the care team in [facility] and look for a) attending/consulting [facility] provider listed and b) the [facility] team listed Log into [email or web address] and use [place]'s universal password to access. If you do not have the password, please contact the hospital operator. Locate the [place] provider you are looking for under [facility] and page to a  number that you can be directly reached. If you still have difficulty reaching the provider, please page the [facility] (Director on Call) for the Hospitalists listed on amion for assistance. [email or web address] Password [email or web address] 03/30/2021, 8:25 PM

## 2021-03-30 NOTE — ED Provider Notes (Signed)
No abdominal  [facility] Emergency Department Provider Note   ____________________________________________   Event Date/Time   First MD Initiated Contact with Patient 03/30/21 1540     (approximate)  I have reviewed the triage vital signs and the nursing notes.   HISTORY  Chief Complaint Weakness    HPI Tiffany Bailey is a 87 y.o. female with past medical history of hypertension, diabetes, CAD status post CABG, CKD, COPD, peripheral vascular disease status post left AKA, and metastatic breast cancer who presents to the ED complaining of generalized weakness.  Patient reports that she has been feeling increasingly weak over the past couple of months, to the point now that she has a hard time putting on her clothes or taking care of herself.  She denies any fevers or shortness of breath, does endorse chronic cough since February that is nonproductive.  She reports feeling nauseous with poor p.o. intake, denies any vomiting, diarrhea, abdominal pain, or dysuria.  She was seen by her oncologist and palliative care earlier today, found to have AKI and worsening transaminitis, subsequently referred to the ED for further evaluation.        Past Medical History:  Diagnosis Date   Anemia    Anginal pain ([place])    Arthritis    Breast cancer ([place]) 07/19/2019   CKD (chronic kidney disease), stage III ([place])    Coronary artery disease    a. 2002 CABG x 3 (LIMA-LAD, SVG-D1, SVG-RCA); b. s/p multiple PCI's.   Diabetes mellitus without complication ([place])    Diastolic dysfunction    a. 12/2017 Echo: EF 55-60%, no rwma, Gr1 DD. Triv AI. Mild-mod MR/TR. PASP 67mHg.   Edema    Failure to thrive in adult    GERD (gastroesophageal reflux disease)    Hb-SS disease without crisis ([place] 10/23/2017   History of breast cancer    HOH (hard of hearing)    Hyperlipidemia    Hypertension    Myocardial infarction ([facility]    PAD (peripheral artery disease) ([place]    a. s/p L AKA; b.  12/2018 PTA R peroneal & PTA/Stenting R SFA.   Palpitations     Patient Active Problem List   Diagnosis Date Noted   Malignant neoplasm of breast in female, estrogen receptor positive ([place] 03/30/2021   Pleural effusion 03/14/2021   Cough 02/13/2021   Cancer ([place] 11/07/2020   Type 2 diabetes mellitus with diabetic neuropathy, unspecified ([place] 11/02/2020   Fatigue 12/15/2019   Osteopenia 10/25/2019   Use of anastrozole (Arimidex) 08/18/2019   Anemia due to stage 3a chronic kidney disease ([place] 08/18/2019   Ulcerative nasal mucositis 08/03/2019   Nausea 08/03/2019   Breast cancer ([place] 07/19/2019   Goals of care, counseling/discussion 07/19/2019   Lung mass 02/10/2019   Constipation 01/27/2019   Headache 01/25/2019   Right leg swelling 01/25/2019   Metastatic breast cancer ([place] 01/05/2019   Anxiety and depression 12/16/2018   Hx of AKA (above knee amputation), left ([place] 08/26/2018   Left arm swelling 07/01/2018   Carotid stenosis 12/19/2017   CAD (coronary artery disease) 10/23/2017   Hb-SS disease without crisis ([place] 10/23/2017   History of CHF (congestive heart failure) 10/23/2017   Old myocardial infarction 10/23/2017   Hand cramp 10/17/2017   Acute non-recurrent maxillary sinusitis 10/17/2017   Skin lesion 09/29/2017   Diarrhea 04/23/2017   Weight loss 09/12/2016   CKD stage 3 due to type 2 diabetes mellitus ([place] 08/14/2016   PAD (peripheral artery disease) (  [place]) 08/14/2016   DM (diabetes mellitus) ([place]) 03/25/2016   Anemia 03/25/2016   Chronic tension-type headache, not intractable 03/25/2016   Essential hypertension 03/25/2016   CAD (coronary artery disease), native coronary artery 03/25/2016   GERD (gastroesophageal reflux disease) 03/25/2016   Hyperlipidemia 03/25/2016   History of breast cancer 03/25/2016   AKI (acute kidney injury) ([place]) 01/28/2016   Hyperkalemia 01/28/2016   Hyponatremia 01/28/2016   Urinary retention 01/28/2016   Hypertensive urgency  01/15/2016   History of nonadherence to medical treatment 09/08/2015   Epigastric pain 08/10/2014   Ischemic heart disease due to coronary artery obstruction ([place]) 01/07/2013    Past Surgical History:  Procedure Laterality Date   ABDOMINAL HYSTERECTOMY     ABOVE KNEE LEG AMPUTATION Left 2013   ANTERIOR VITRECTOMY Left 11/16/2015   Procedure: ANTERIOR VITRECTOMY;  Surgeon: Eulogio Bear, MD;  Location: [facility] ORS;  Service: Ophthalmology;  Laterality: Left;   BLADDER SURGERY     CATARACT EXTRACTION W/PHACO Left 11/16/2015   Procedure: CATARACT EXTRACTION PHACO AND INTRAOCULAR LENS PLACEMENT (IOC);  Surgeon: Eulogio Bear, MD;  Location: [facility] ORS;  Service: Ophthalmology;  Laterality: Left;  Lot # [id number] [place]; 01:22.8 AP%:12.6 CDE: 10.46   CATARACT EXTRACTION W/PHACO Right 08/22/2016   Procedure: CATARACT EXTRACTION PHACO AND INTRAOCULAR LENS PLACEMENT (IOC);  Surgeon: Eulogio Bear, MD;  Location: [facility] ORS;  Service: Ophthalmology;  Laterality: Right;  Lot # [id number] H [place]: 00:52.1 AP%:8.7 CDE: 4.99    CORONARY ANGIOPLASTY     STENT   CORONARY ARTERY BYPASS GRAFT     LOWER EXTREMITY ANGIOGRAPHY Right 01/14/2019   Procedure: LOWER EXTREMITY ANGIOGRAPHY;  Surgeon: Algernon Huxley, MD;  Location: [place] [facility];  Service: Cardiovascular;  Laterality: Right;   MASTECTOMY     TUMOR REMOVAL     ABDOMINAL    Prior to Admission medications   Medication Sig Start Date End Date Taking? Authorizing Provider  abemaciclib (VERZENIO) 150 MG tablet Take 1 tablet (150 mg total) by mouth 2 (two) times daily. Swallow tablets whole. Do not chew, crush, or split tablets before swallowing. 03/19/21   [name], MD  amLODipine (NORVASC) 10 MG tablet TAKE 1 TABLET BY MOUTH  DAILY 01/05/21   Minna Merritts, MD  aspirin EC 81 MG tablet Take 1 tablet (81 mg total) by mouth daily. 01/15/17   [name], DO  atorvastatin (LIPITOR) 40 MG tablet TAKE 1 TABLET BY MOUTH  DAILY 05/25/20   [name],  FNP  azelastine (ASTELIN) 0.1 % nasal spray Place 1 spray into both nostrils 2 (two) times daily. Use in each nostril as directed Patient not taking: Reported on 03/30/2021 02/13/21   [name], FNP  blood glucose meter kit and supplies KIT Dispense based on patient and insurance preference. Use up to four times daily as directed. (FOR ICD-9 250.00, 250.01). 01/13/17   [name], DO  cloNIDine (CATAPRES) 0.1 MG tablet Take 0.1 mg by mouth 2 (two) times daily. 01/07/21   [provider]  clopidogrel (PLAVIX) 75 MG tablet TAKE 1 TABLET BY MOUTH  DAILY 01/05/21   Minna Merritts, MD  ferrous gluconate (FERGON) 324 MG tablet Take 324 mg by mouth 2 (two) times daily. 01/08/21   [provider]  GARLIC PO Take by mouth.    [provider]  glucose blood (ONE TOUCH TEST STRIPS) test strip Use to test blood sugar twice daily 09/17/17   [name], FNP  hydrALAZINE (  APRESOLINE) 10 MG tablet Take 1 tablet (10 mg total) by mouth in the morning and at bedtime. 03/02/21   [name], [name], PA-C  insulin degludec (TRESIBA FLEXTOUCH) 100 UNIT/ML FlexTouch Pen Inject 6 Units into the skin daily. 02/25/21   [name], FNP  Insulin Pen Needle (BD PEN NEEDLE NANO U/F) 32G X 4 MM MISC USE 1 DAILY 08/03/18   [name], FNP  Metoprolol Tartrate 37.5 MG TABS Take 1 tablet by mouth 2 (two) times daily. 12/22/20   [provider]  mirtazapine (REMERON) 7.5 MG tablet Take 1 tablet (7.5 mg total) by mouth at bedtime. 03/27/21   [name], FNP  mirtazapine (REMERON) 7.5 MG tablet Take by mouth. Take 7.5 mg by mouth 1 (one) time each day 03/27/21   [provider]  nitroGLYCERIN (NITROSTAT) 0.4 MG SL tablet Place 1 tablet (0.4 mg total) under the tongue every 5 (five) minutes as needed for chest pain. 12/19/17   Minna Merritts, MD  ONE TOUCH LANCETS MISC Use as directed 2 times per day 09/17/17   [name], FNP  vitamin C (ASCORBIC ACID)  250 MG tablet Take 250 mg by mouth daily.    [provider]    Allergies Amoxicillin, Gabapentin, Mirtazapine, and Other  Family History  Problem Relation Age of Onset   Sudden death Mother    Arthritis Father    Stroke Father    Breast cancer Sister    Diabetes Grandchild     Social History Social History   Tobacco Use   Smoking status: Never   Smokeless tobacco: Never  Substance Use Topics   Alcohol use: No   Drug use: No    Review of Systems  Constitutional: No fever/chills.  Positive for generalized weakness. Eyes: No visual changes. ENT: No sore throat. Cardiovascular: Denies chest pain. Respiratory: Denies shortness of breath.  Positive for cough. Gastrointestinal: No abdominal pain.  No nausea, no vomiting.  No diarrhea.  No constipation. Genitourinary: Negative for dysuria. Musculoskeletal: Negative for back pain. Skin: Negative for rash. Neurological: Negative for headaches, focal weakness or numbness.  ____________________________________________   PHYSICAL EXAM:  VITAL SIGNS: ED Triage Vitals  Enc Vitals Group     BP 03/30/21 1500 111/78     Pulse Rate 03/30/21 1500 71     Resp 03/30/21 1500 16     Temp 03/30/21 1500 98.3 F (36.8 C)     Temp Source 03/30/21 1500 Oral     SpO2 03/30/21 1500 100 %     Weight 03/30/21 1512 81 lb (36.7 kg)     Height --      Head Circumference --      Peak Flow --      Pain Score 03/30/21 1512 0     Pain Loc --      Pain Edu? --      Excl. in [place]? --     Constitutional: Alert and oriented. Eyes: Conjunctivae are normal. Head: Atraumatic. Nose: No congestion/rhinnorhea. Mouth/Throat: Mucous membranes are dry. Neck: Normal ROM Cardiovascular: Normal rate, regular rhythm. Grossly normal heart sounds.  2+ radial pulses bilaterally. Respiratory: Normal respiratory effort.  No retractions. Lungs CTAB. Gastrointestinal: Soft and nontender. No distention. Genitourinary: deferred Musculoskeletal:  Status post left AKA, no lower extremity tenderness nor edema. Neurologic:  Normal speech and language. No gross focal neurologic deficits are appreciated. Skin:  Skin is warm, dry and intact. No rash noted. Psychiatric: Mood and affect are normal.  Speech and behavior are normal.  ____________________________________________   LABS (all labs ordered are listed, but only abnormal results are displayed)  Labs Reviewed  BASIC METABOLIC PANEL - Abnormal; Notable for the following components:      Result Value   CO2 21 (*)    BUN 34 (*)    Creatinine, Ser 1.89 (*)    GFR, Estimated 25 (*)    All other components within normal limits  CBC - Abnormal; Notable for the following components:   RBC 3.65 (*)    Hemoglobin 11.2 (*)    HCT 33.8 (*)    All other components within normal limits  HEPATIC FUNCTION PANEL - Abnormal; Notable for the following components:   Albumin 3.1 (*)    AST 165 (*)    ALT 167 (*)    All other components within normal limits  RESP PANEL BY RT-PCR (FLU A&B, COVID) ARPGX2  LIPASE, BLOOD  URINALYSIS, COMPLETE (UACMP) WITH MICROSCOPIC  CBG MONITORING, ED   ____________________________________________  EKG  ED ECG REPORT I, Blake Divine, the attending physician, personally viewed and interpreted this ECG.   Date: 03/30/2021  EKG Time: 15:25  Rate: 61  Rhythm: normal sinus rhythm  Axis: RAD  Intervals:left posterior fascicular block  ST&T Change: Nonspecific T wave changes   PROCEDURES  Procedure(s) performed (including Critical Care):  Procedures   ____________________________________________   INITIAL IMPRESSION / ASSESSMENT AND PLAN / ED COURSE      87 year old female with past medical history of hypertension, diabetes, CAD status post CABG, CKD, COPD, peripheral vascular disease status post left AKA, and metastatic breast cancer who presents to the ED complaining of worsening generalized weakness.  She was found to have AKI and  worsening transaminitis at her oncologist office today, referred to the ED for further IV fluid hydration and likely admission.  She denies any symptoms of potential infection and vital signs are not consistent with sepsis.  We will hydrate with IV fluids and repeat labs, screen chest x-ray and UA.  Chest x-ray reviewed by me and shows no infiltrate, edema, or effusion.  Labs again demonstrate AKI with mild transaminitis and we will hydrate with IV fluids.  She has no abdominal tenderness, nausea, or vomiting to suggest biliary pathology, bilirubin within normal limits.  Given her generalized weakness with AKI, case discussed with hospitalist for admission.      ____________________________________________   FINAL CLINICAL IMPRESSION(S) / ED DIAGNOSES  Final diagnoses:  Generalized weakness  AKI (acute kidney injury) ([place])  Metastatic malignant neoplasm, unspecified site [facility])     ED Discharge Orders     None        Note:  This document was prepared using Dragon voice recognition software and may include unintentional dictation errors.    Blake Divine, MD 03/30/21 1910

## 2021-03-30 NOTE — Telephone Encounter (Signed)
Call pt  See they are in ed  Please confirm this and oncology notes, she declines  Please advise to STOP jardiance until she has an appt with me due to decrease in renal function

## 2021-03-30 NOTE — ED Notes (Signed)
MD messaged securely about pt CBG. Pt given Gatorade from bedside. Pt placed on bedpan for urine. Pt coccyx bone has stage 2-3  pressure sore. Foam placed on patient back. Pt resting in bed at this time. No needs verbalized.

## 2021-03-30 NOTE — Patient Instructions (Signed)

## 2021-03-31 DIAGNOSIS — L8990 Pressure ulcer of unspecified site, unspecified stage: Secondary | ICD-10-CM | POA: Insufficient documentation

## 2021-03-31 DIAGNOSIS — R531 Weakness: Secondary | ICD-10-CM | POA: Diagnosis not present

## 2021-03-31 LAB — CBC
HCT: 31.1 % — ABNORMAL LOW (ref 36.0–46.0)
Hemoglobin: 10.4 g/dL — ABNORMAL LOW (ref 12.0–15.0)
MCH: 30.2 pg (ref 26.0–34.0)
MCHC: 33.4 g/dL (ref 30.0–36.0)
MCV: 90.4 fL (ref 80.0–100.0)
Platelets: 222 10*3/uL (ref 150–400)
RBC: 3.44 MIL/uL — ABNORMAL LOW (ref 3.87–5.11)
RDW: 15 % (ref 11.5–15.5)
WBC: 7.1 10*3/uL (ref 4.0–10.5)
nRBC: 0 % (ref 0.0–0.2)

## 2021-03-31 LAB — GLUCOSE, CAPILLARY
Glucose-Capillary: 65 mg/dL — ABNORMAL LOW (ref 70–99)
Glucose-Capillary: 80 mg/dL (ref 70–99)
Glucose-Capillary: 112 mg/dL — ABNORMAL HIGH (ref 70–99)
Glucose-Capillary: 127 mg/dL — ABNORMAL HIGH (ref 70–99)

## 2021-03-31 LAB — COMPREHENSIVE METABOLIC PANEL
ALT: 133 U/L — ABNORMAL HIGH (ref 0–44)
AST: 132 U/L — ABNORMAL HIGH (ref 15–41)
Albumin: 2.7 g/dL — ABNORMAL LOW (ref 3.5–5.0)
Alkaline Phosphatase: 78 U/L (ref 38–126)
Anion gap: 4 — ABNORMAL LOW (ref 5–15)
BUN: 31 mg/dL — ABNORMAL HIGH (ref 8–23)
CO2: 18 mmol/L — ABNORMAL LOW (ref 22–32)
Calcium: 8.4 mg/dL — ABNORMAL LOW (ref 8.9–10.3)
Chloride: 115 mmol/L — ABNORMAL HIGH (ref 98–111)
Creatinine, Ser: 1.65 mg/dL — ABNORMAL HIGH (ref 0.44–1.00)
GFR, Estimated: 29 mL/min — ABNORMAL LOW (ref >60)
Glucose, Bld: 51 mg/dL — ABNORMAL LOW (ref 70–99)
Potassium: 3.8 mmol/L (ref 3.5–5.1)
Sodium: 137 mmol/L (ref 135–145)
Total Bilirubin: 0.7 mg/dL (ref 0.3–1.2)
Total Protein: 5.6 g/dL — ABNORMAL LOW (ref 6.5–8.1)

## 2021-03-31 MED ORDER — ENSURE ENLIVE PO LIQD: 237.0000 mL | Freq: Two times a day (BID) | ORAL | Status: AC

## 2021-03-31 MED ORDER — TRESIBA FLEXTOUCH 100 UNIT/ML ~~LOC~~ SOPN: PEN_INJECTOR | SUBCUTANEOUS | 1 refills | Status: DC

## 2021-03-31 NOTE — Progress Notes (Signed)
Patient requested to be placed on bedpan to void - says she can't use the purewick.

## 2021-03-31 NOTE — Progress Notes (Signed)
PIV removed. AVS discussed with patient and daughter at bedside. Both parties verbalize understanding of follow up appointments and medication regimen.

## 2021-03-31 NOTE — Discharge Summary (Addendum)
Physician Discharge Summary   Tiffany Bailey  female DOB: 27-Jan-1928  ZHY:[id number]  PCP: [name], FNP  Admit date: 03/30/2021 Discharge date: 03/31/2021  Admitted From: home Disposition:  home Daughter updated at bedside prior to discharge.  Home Health: Yes CODE STATUS: Full code  Discharge Instructions     Discharge instructions   Complete by: As directed    You presented with some kidney injury likely due to dehydration, which has improved with IV fluids.  Your blood sugars had been low in the hospital without insulin given.  Given your age and reduced oral intake, I recommend holding your home insulin and Jardiance for now, to avoid dropping your blood sugars, which is more dangerous for you than slightly high blood sugars.  Your A1c of 8.6 may be ok for your age and medical conditions.  As discussed with your daughter, if your previous high sugar readings were taken after you ate, then that's to be expected.  Blood sugar should be checked first thing in the morning or before eating a meal.  You have bed sore, stage 2.  Please avoid lying on the same spot for prolonged period of time.  Turn often and out of bed often.   Dr. [name] - -   No wound care   Complete by: As directed         Hospital Course:  For full details, please see H&P, progress notes, consult notes and ancillary notes.  Briefly,  Tiffany Bailey is a 87 y.o. female with hx of hypertension, diabetes, CAD status post CABG, CKD, COPD, peripheral vascular disease status post left AKA, and metastatic breast cancer who was sent by oncology office due to labs finding of AKI.  Patient reported having declining functional status weakness decreased appetite decreased p.o. intake.  Patient having persistent and worsening cough since February cough attributed to cancer progression.  Patient recently started on Jardiance for optimizing diabetes treatment in September 2022.  ROS is otherwise  negative.     Acute kidney injury on chronic kidney disease 3b --Due to combination of medication i.e. Jardiance which is new, and decreased p.o. intake.   --Cr 1.97 on the day of presentation.  Patient's baseline creatinine is 1.3-1.4.  Pt received gentle IV hydration with Cr improved to 1.65 the next day.  --Jardiance d/c'ed at discharge.  DM2 --Pt is supposed to take [place] 6u daily, and Jardiacne was added recently reportedly due to A1c still elevated (8.6).   --Pt's BG dropped to 50's and 60's throughout the night after presentation, without any insulin and hypoglycemics given.  Pt also reported having episodes of sweating at nights, indicating possible hypoglycemic events at home as well.  Given pt's reduced oral intake, advanced age, and multiple comorbidities  including end-stage cancer, I advised pt and family to stop home insulin and Jardiance for now.    Cough: --no cough noted since presentation.  Covid neg.  Chest x-ray negative for any infiltrate or effusion, fractures or pneumothorax.  Hypertension: Cont home amlodipine, hydralazine and metop --per med list, pt not taking clonidine.   Anemia due to CKD: Hgb stable ~10's.   CAD status post CABG --cont home ASA 81, plavix and statin   Pressure Injury, Sacrum Mid Stage 2, POA   Discharge Diagnoses:  Principal Problem:   Generalized weakness Active Problems:   Essential hypertension   GERD (gastroesophageal reflux disease)   Acute kidney injury superimposed on chronic kidney disease (HCC)   CAD (  coronary artery disease)   Anemia due to stage 3a chronic kidney disease (HCC)   Cough   Pressure injury of skin     Discharge Instructions:  Allergies as of 03/31/2021       Reactions   Amoxicillin Hives, Nausea Only   Gabapentin Other (See Comments)   AMS Confusion   Mirtazapine Itching   Dry mouth with ODT Denies SOB with medication   Other    UNKNOWN PAIN MEDICATION - unknown reaction         Medication List     STOP taking these medications    azelastine 0.1 % nasal spray Commonly known as: ASTELIN   cloNIDine 0.1 MG tablet Commonly known as: CATAPRES   GARLIC PO   mirtazapine 7.5 MG tablet Commonly known as: REMERON       TAKE these medications    amLODipine 10 MG tablet Commonly known as: NORVASC TAKE 1 TABLET BY MOUTH  DAILY   aspirin EC 81 MG tablet Take 1 tablet (81 mg total) by mouth daily.   atorvastatin 40 MG tablet Commonly known as: LIPITOR TAKE 1 TABLET BY MOUTH  DAILY   blood glucose meter kit and supplies Kit Dispense based on patient and insurance preference. Use up to four times daily as directed. (FOR ICD-9 250.00, 250.01).   clopidogrel 75 MG tablet Commonly known as: PLAVIX TAKE 1 TABLET BY MOUTH  DAILY   feeding supplement Liqd Take 237 mLs by mouth 2 (two) times daily between meals.   ferrous gluconate 324 MG tablet Commonly known as: FERGON Take 324 mg by mouth 2 (two) times daily.   glucose blood test strip Commonly known as: ONE TOUCH TEST STRIPS Use to test blood sugar twice daily   hydrALAZINE 10 MG tablet Commonly known as: APRESOLINE Take 1 tablet (10 mg total) by mouth in the morning and at bedtime.   Insulin Pen Needle 32G X 4 MM Misc Commonly known as: BD Pen Needle Nano U/F USE 1 DAILY   Metoprolol Tartrate 37.5 MG Tabs Take 1 tablet by mouth 2 (two) times daily.   nitroGLYCERIN 0.4 MG SL tablet Commonly known as: NITROSTAT Place 1 tablet (0.4 mg total) under the tongue every 5 (five) minutes as needed for chest pain.   ONE TOUCH LANCETS Misc Use as directed 2 times per day   Tresiba FlexTouch 100 UNIT/ML FlexTouch Pen Generic drug: insulin degludec Hold until followup with your outpatient doctor, since you are not eating well, and your blood sugars were low in the hospital even without insulin. What changed:  how much to take how to take this when to take this additional instructions   Verzenio  150 MG tablet Generic drug: abemaciclib Take 1 tablet (150 mg total) by mouth 2 (two) times daily. Swallow tablets whole. Do not chew, crush, or split tablets before swallowing.   vitamin C 250 MG tablet Commonly known as: ASCORBIC ACID Take 250 mg by mouth daily.         Follow-up Information     [name], FNP Follow up in 1 week(s).   Specialty: Family Medicine Contact information: [address] [place] [address] [phone]                 Allergies  Allergen Reactions   Amoxicillin Hives and Nausea Only   Gabapentin Other (See Comments)    AMS Confusion   Mirtazapine Itching    Dry mouth with ODT Denies SOB with medication  Other     UNKNOWN PAIN MEDICATION - unknown reaction     The results of significant diagnostics from this hospitalization (including imaging, microbiology, ancillary and laboratory) are listed below for reference.   Consultations:   Procedures/Studies: DG Chest 2 View  Result Date: 03/30/2021 CLINICAL DATA:  Weakness. EXAM: CHEST - 2 VIEW COMPARISON:  Chest x-ray 02/13/2021, CT chest 03/21/2020 chest x-ray 10/17/2017, CT chest 03/01/2021 FINDINGS: The heart and mediastinal contours are unchanged. Aortic calcification. Coronary artery stents. Hyperinflation of the lungs. Slightly less conspicuous patchy nodular like densities. No pulmonary edema. Blunting of bilateral costophrenic angles with trace bilateral pleural effusions not excluded. No pneumothorax. No acute osseous abnormality. IMPRESSION: Slightly less conspicuous patchy nodular like densities. Electronically Signed   By: Iven Finn M.D.   On: 03/30/2021 17:35      Labs: BNP (last 3 results) No results for input(s): BNP in the last 8760 hours. Basic Metabolic Panel: Recent Labs  Lab 03/30/21 1109 03/30/21 1649 03/31/21 0428  NA 129* 136 137  K 4.1 4.4 3.8  CL 105 108 115*  CO2 17* 21* 18*  GLUCOSE 86 88 51*  BUN 37* 34* 31*   CREATININE 1.97* 1.89* 1.65*  CALCIUM 8.3* 9.0 8.4*   Liver Function Tests: Recent Labs  Lab 03/30/21 1109 03/30/21 1650 03/31/21 0428  AST 157* 165* 132*  ALT 155* 167* 133*  ALKPHOS 78 89 78  BILITOT 0.7 0.7 0.7  PROT 6.0* 6.5 5.6*  ALBUMIN 3.0* 3.1* 2.7*   Recent Labs  Lab 03/30/21 1650  LIPASE 40   No results for input(s): AMMONIA in the last 168 hours. CBC: Recent Labs  Lab 03/30/21 1109 03/30/21 1649 03/31/21 0428  WBC 7.6 8.1 7.1  NEUTROABS 6.2  --   --   HGB 10.1* 11.2* 10.4*  HCT 31.3* 33.8* 31.1*  MCV 94.0 92.6 90.4  PLT 231 243 222   Cardiac Enzymes: No results for input(s): CKTOTAL, CKMB, CKMBINDEX, TROPONINI in the last 168 hours. BNP: Invalid input(s): POCBNP CBG: Recent Labs  Lab 03/30/21 2144 03/30/21 2145 03/31/21 0602 03/31/21 0639 03/31/21 0811  GLUCAP 65* 70 65* 80 112*   D-Dimer No results for input(s): DDIMER in the last 72 hours. Hgb A1c No results for input(s): HGBA1C in the last 72 hours. Lipid Profile No results for input(s): CHOL, HDL, LDLCALC, TRIG, CHOLHDL, LDLDIRECT in the last 72 hours. Thyroid function studies No results for input(s): TSH, T4TOTAL, T3FREE, THYROIDAB in the last 72 hours.  Invalid input(s): FREET3 Anemia work up No results for input(s): VITAMINB12, FOLATE, FERRITIN, TIBC, IRON, RETICCTPCT in the last 72 hours. Urinalysis    Component Value Date/Time   COLORURINE YELLOW (A) 03/30/2021 1430   APPEARANCEUR HAZY (A) 03/30/2021 1430   APPEARANCEUR Clear 03/05/2019 1210   LABSPEC 1.011 03/30/2021 1430   PHURINE 5.0 03/30/2021 1430   GLUCOSEU >=500 (A) 03/30/2021 1430   HGBUR NEGATIVE 03/30/2021 1430   BILIRUBINUR NEGATIVE 03/30/2021 1430   BILIRUBINUR Negative 03/05/2019 1210   KETONESUR NEGATIVE 03/30/2021 1430   PROTEINUR NEGATIVE 03/30/2021 1430   NITRITE NEGATIVE 03/30/2021 1430   LEUKOCYTESUR TRACE (A) 03/30/2021 1430   Sepsis Labs Invalid input(s): PROCALCITONIN,  WBC,   LACTICIDVEN Microbiology Recent Results (from the past 240 hour(s))  Resp Panel by RT-PCR (Flu A&B, Covid) Nasopharyngeal Swab     Status: None   Collection Time: 03/30/21  4:27 PM   Specimen: Nasopharyngeal Swab; Nasopharyngeal(NP) swabs in vial transport medium  Result Value Ref Range Status  SARS Coronavirus 2 by RT PCR NEGATIVE NEGATIVE Final    Comment: (NOTE) SARS-CoV-2 target nucleic acids are NOT DETECTED.  The SARS-CoV-2 RNA is generally detectable in upper respiratory specimens during the acute phase of infection. The lowest concentration of SARS-CoV-2 viral copies this assay can detect is 138 copies/mL. A negative result does not preclude SARS-Cov-2 infection and should not be used as the sole basis for treatment or other patient management decisions. A negative result may occur with  improper specimen collection/handling, submission of specimen other than nasopharyngeal swab, presence of viral mutation(s) within the areas targeted by this assay, and inadequate number of viral copies(<138 copies/mL). A negative result must be combined with clinical observations, patient history, and epidemiological information. The expected result is Negative.  Fact Sheet for Patients:  EntrepreneurPulse.com.au  Fact Sheet for Healthcare Providers:  IncredibleEmployment.be  This test is no t yet approved or cleared by the [place] FDA and  has been authorized for detection and/or diagnosis of SARS-CoV-2 by FDA under an Emergency Use Authorization (EUA). This EUA will remain  in effect (meaning this test can be used) for the duration of the COVID-19 declaration under Section 564(b)(1) of the Act, 21 U.S.C.section 360bbb-3(b)(1), unless the authorization is terminated  or revoked sooner.       Influenza A by PCR NEGATIVE NEGATIVE Final   Influenza B by PCR NEGATIVE NEGATIVE Final    Comment: (NOTE) The Xpert Xpress SARS-CoV-2/FLU/RSV plus  assay is intended as an aid in the diagnosis of influenza from Nasopharyngeal swab specimens and should not be used as a sole basis for treatment. Nasal washings and aspirates are unacceptable for Xpert Xpress SARS-CoV-2/FLU/RSV testing.  Fact Sheet for Patients: EntrepreneurPulse.com.au  Fact Sheet for Healthcare Providers: IncredibleEmployment.be  This test is not yet approved or cleared by the [place] FDA and has been authorized for detection and/or diagnosis of SARS-CoV-2 by FDA under an Emergency Use Authorization (EUA). This EUA will remain in effect (meaning this test can be used) for the duration of the COVID-19 declaration under Section 564(b)(1) of the Act, 21 U.S.C. section 360bbb-3(b)(1), unless the authorization is terminated or revoked.  Performed at [facility], [address] [place], [place] [address]      Total time spend on discharging this patient, including the last patient exam, discussing the hospital stay, instructions for ongoing care as it relates to all pertinent caregivers, as well as preparing the medical discharge records, prescriptions, and/or referrals as applicable, is 50 minutes.    [name], MD  [facility] 03/31/2021, 11:46 AM

## 2021-03-31 NOTE — Progress Notes (Addendum)
Administered 240 ml (patient refused to drink more) orange juice for lab glucose value of 51. Will recheck glucose level and continue to monitor. Patient asymptomatic.

## 2021-03-31 NOTE — Progress Notes (Signed)
Rechecked glucose - level now 80.

## 2021-03-31 NOTE — Evaluation (Signed)
Physical Therapy Evaluation Patient Details Name: Tiffany Bailey MRN: [id number] DOB: 09-Nov-1927 Today's Date: 03/31/2021  History of Present Illness  Pt is a 87 y.o. female seen in ED with complaints of abnormal labs and sent from oncology office. MD assessment includes general weakness, anemia, and AKI. PMH includes HTN, L AKA, CHF, PAD, breast CA, CKD, COPD, HOH, and MI.   Clinical Impression  Pt was pleasant and motivated to participate during the session and put forth good effort during the session.  Pt required extra time and effort with bed mobility tasks but no physical assistance.  Pt reported doing SPT's at home but expressed an interest in attempting to stand and transfer with a RW.  Upon coming to standing the pt was somewhat tremulous on the RLE and was unable to pivot or perform hop-to steps with the RW.  Pt returned to sitting at the EOB and then was able to perform SPT's to the chair and back to the bed without physical assistance.  Pt reported that up until about a year ago she was ambulatory with her prosthetic limb but stopped using it secondary to it being uncomfortable. Pt will benefit from HHPT upon discharge to safely address deficits listed in patient problem list for decreased caregiver assistance and eventual return to PLOF as well as for assessment of pt's prosthetic limb for hope of return to ambulation.         Recommendations for follow up therapy are one component of a multi-disciplinary discharge planning process, led by the attending physician.  Recommendations may be updated based on patient status, additional functional criteria and insurance authorization.  Follow Up Recommendations Home health PT;Supervision - Intermittent    Equipment Recommendations  3in1 (PT)    Recommendations for Other Services       Precautions / Restrictions Precautions Precautions: Fall Restrictions Weight Bearing Restrictions: No      Mobility  Bed Mobility Overal bed  mobility: Modified Independent             General bed mobility comments: Min extra time and effort only    Transfers Overall transfer level: Needs assistance Equipment used: None;Rolling walker (2 wheeled) Transfers: Stand Pivot Transfers;Sit to/from Stand Sit to Stand: Min guard Stand pivot transfers: Supervision       General transfer comment: Pt able to come to standing with a RW with CGA but was unable to use the RW to assist with transfer to the chair, preferred SPT; Pt was steady with SPT's to/from chair and reported feeling close to her baseline  Ambulation/Gait             General Gait Details: Unable  Stairs            Wheelchair Mobility    Modified Rankin (Stroke Patients Only)       Balance Overall balance assessment: Needs assistance   Sitting balance-Leahy Scale: Good     Standing balance support: Bilateral upper extremity supported;During functional activity Standing balance-Leahy Scale: Fair Standing balance comment: Mod lean on the RW for support                             Pertinent Vitals/Pain Pain Assessment: No/denies pain    Home Living Family/patient expects to be discharged to:: Private residence Living Arrangements: Children Available Help at Discharge: Family;Personal care attendant;Available PRN/intermittently Type of Home: House Home Access: Ramped entrance     Home Layout: One level Home Equipment:  Walker - 2 wheels;Bedside commode;Wheelchair - manual Additional Comments: Has a PCA M-F for two hours in the morning; son works during the day and is then home during the evening/night    Prior Function Level of Independence: Needs assistance   Gait / Transfers Assistance Needed: Ind with SPT's from w/c to various surfaces, does not ambulate, Ind with w/c in the home, has a prosthetic limb but has not used it for over a year, occasional assist with bed mobility as needed; one fall in the last year when  slipped off of the EOB  ADL's / Homemaking Assistance Needed: Ind with bathing, assist from PCA with dressing, cooking, and cleaning        Hand Dominance        Extremity/Trunk Assessment   Upper Extremity Assessment Upper Extremity Assessment: Generalized weakness    Lower Extremity Assessment Lower Extremity Assessment: Generalized weakness       Communication   Communication: No difficulties  Cognition Arousal/Alertness: Awake/alert Behavior During Therapy: WFL for tasks assessed/performed Overall Cognitive Status: Within Functional Limits for tasks assessed                                        General Comments      Exercises     Assessment/Plan    PT Assessment Patient needs continued PT services  PT Problem List Decreased strength;Decreased activity tolerance;Decreased balance;Decreased mobility;Decreased knowledge of use of DME       PT Treatment Interventions DME instruction;Functional mobility training;Therapeutic activities;Therapeutic exercise;Balance training;Patient/family education    PT Goals (Current goals can be found in the Care Plan section)  Acute Rehab PT Goals Patient Stated Goal: To return home PT Goal Formulation: With patient Time For Goal Achievement: 04/13/21 Potential to Achieve Goals: Good    Frequency Min 2X/week   Barriers to discharge        Co-evaluation               AM-PAC PT "6 Clicks" Mobility  Outcome Measure Help needed turning from your back to your side while in a flat bed without using bedrails?: A Little Help needed moving from lying on your back to sitting on the side of a flat bed without using bedrails?: A Little Help needed moving to and from a bed to a chair (including a wheelchair)?: A Little Help needed standing up from a chair using your arms (e.g., wheelchair or bedside chair)?: A Little Help needed to walk in hospital room?: Total Help needed climbing 3-5 steps with a  railing? : Total 6 Click Score: 14    End of Session Equipment Utilized During Treatment: Gait belt Activity Tolerance: Patient tolerated treatment well Patient left: in bed;with nursing/sitter in room;with call bell/phone within reach;with bed alarm set Nurse Communication: Mobility status PT Visit Diagnosis: Unsteadiness on feet (R26.81);Muscle weakness (generalized) (M62.81)    Time: 5102-5852 PT Time Calculation (min) (ACUTE ONLY): 34 min   Charges:   PT Evaluation $PT Eval Moderate Complexity: 1 Mod          D. Scott Jaysa Kise PT, DPT 03/31/21, 12:44 PM

## 2021-03-31 NOTE — Progress Notes (Signed)
Glucose level 65. Administered 240 ml orange juice. Will recheck. Patient asymptomatic.

## 2021-03-31 NOTE — TOC Initial Note (Addendum)
Transition of Care [facility]) - Initial/Assessment Note    Patient Details  Name: Tiffany Bailey MRN: [id number] Date of Birth: 1927/09/06  Transition of [facility]) CM/SW Contact:    Magnus Ivan, LCSW Phone Number: 03/31/2021, 1:12 PM  Clinical Narrative:        Patient is being discharged home. Spoke with daughter Tiffany Bailey) via phone per patient's request. Tiffany Bailey reported patient lives with patient's son. Tiffany Bailey provides transportation. PCP is [name], NP. Patient has a BSC. Explained HHPT rec. Tiffany Bailey stated she thinks patient's PCP was setting up [facility] for patient, she was unsure of agency. She reported she would rather follow up with PCP office on Monday instead of CSW setting up [place] for patient prior to hospital discharge. PT recommended getting another BSC. CSW checked with [place] who reported insurance covers a BSC every 5 years, if patient does not qualify for another one with insurance it would cost $40. Informed Tiffany Bailey of this. She verbalized understanding and reported it is up to the patient.  Informed [name] per RN, patient is ready for pick up. She says she is on the way to the hospital.   Expected Discharge Plan: [place] Barriers to Discharge: Barriers Resolved   Patient Goals and CMS Choice Patient states their goals for this hospitalization and ongoing recovery are:: home with family CMS Medicare.gov Compare Post Acute Care list provided to:: Patient Represenative (must comment) Choice offered to / list presented to : Adult Children  Expected Discharge Plan and Services Expected Discharge Plan: [place]       Living arrangements for the past 2 months: Single Family Home Expected Discharge Date: 03/31/21                                    Prior Living Arrangements/Services Living arrangements for the past 2 months: Single Family Home Lives with:: Adult Children Patient language and need for interpreter  reviewed:: Yes Do you feel safe going back to the place where you live?: Yes      Need for Family Participation in Patient Care: Yes (Comment) Care giver support system in place?: Yes (comment) Current home services: DME Criminal Activity/Legal Involvement Pertinent to Current Situation/Hospitalization: No - Comment as needed  Activities of Daily Living Home Assistive Devices/Equipment: Wheelchair ADL Screening (condition at time of admission) Patient's cognitive ability adequate to safely complete daily activities?: Yes Is the patient deaf or have difficulty hearing?: No Does the patient have difficulty seeing, even when wearing glasses/contacts?: Yes Does the patient have difficulty concentrating, remembering, or making decisions?: No Patient able to express need for assistance with ADLs?: Yes Does the patient have difficulty dressing or bathing?: No Independently performs ADLs?: Yes (appropriate for developmental age) Does the patient have difficulty walking or climbing stairs?: Yes Weakness of Legs: Right Weakness of Arms/Hands: Both  Permission Sought/Granted Permission sought to share information with : Family Supports Permission granted to share information with : Yes, Verbal Permission Granted              Emotional Assessment       Orientation: : Oriented to Self, Oriented to Place, Oriented to  Time, Oriented to Situation Alcohol / Substance Use: Not Applicable Psych Involvement: No (comment)  Admission diagnosis:  Generalized weakness [R53.1] AKI (acute kidney injury) ([place]) [N17.9] Acute kidney injury superimposed on chronic kidney disease ([place]) [N17.9, N18.9] Metastatic  malignant neoplasm, unspecified site [facility]) [C79.9] Patient Active Problem List   Diagnosis Date Noted   Pressure injury of skin 03/31/2021   Malignant neoplasm of breast in female, estrogen receptor positive ([place]) 03/30/2021   Pleural effusion 03/14/2021   Cough 02/13/2021   Cancer ([place])  11/07/2020   Type 2 diabetes mellitus with diabetic neuropathy, unspecified ([place]) 11/02/2020   Generalized weakness 12/15/2019   Osteopenia 10/25/2019   Use of anastrozole (Arimidex) 08/18/2019   Anemia due to stage 3a chronic kidney disease ([place]) 08/18/2019   Ulcerative nasal mucositis 08/03/2019   Nausea 08/03/2019   Breast cancer ([place]) 07/19/2019   Goals of care, counseling/discussion 07/19/2019   Lung mass 02/10/2019   Constipation 01/27/2019   Headache 01/25/2019   Right leg swelling 01/25/2019   Metastatic breast cancer ([place]) 01/05/2019   Anxiety and depression 12/16/2018   Hx of AKA (above knee amputation), left ([place]) 08/26/2018   Left arm swelling 07/01/2018   Carotid stenosis 12/19/2017   CAD (coronary artery disease) 10/23/2017   Hb-SS disease without crisis ([place]) 10/23/2017   History of CHF (congestive heart failure) 10/23/2017   Old myocardial infarction 10/23/2017   Hand cramp 10/17/2017   Acute non-recurrent maxillary sinusitis 10/17/2017   Skin lesion 09/29/2017   Diarrhea 04/23/2017   Weight loss 09/12/2016   CKD stage 3 due to type 2 diabetes mellitus ([place]) 08/14/2016   PAD (peripheral artery disease) ([place]) 08/14/2016   DM (diabetes mellitus) ([place]) 03/25/2016   Chronic tension-type headache, not intractable 03/25/2016   Essential hypertension 03/25/2016   CAD (coronary artery disease), native coronary artery 03/25/2016   GERD (gastroesophageal reflux disease) 03/25/2016   Hyperlipidemia 03/25/2016   History of breast cancer 03/25/2016   Acute kidney injury superimposed on chronic kidney disease ([place]) 01/28/2016   Hyperkalemia 01/28/2016   Hyponatremia 01/28/2016   Urinary retention 01/28/2016   Hypertensive urgency 01/15/2016   History of nonadherence to medical treatment 09/08/2015   Epigastric pain 08/10/2014   Ischemic heart disease due to coronary artery obstruction ([place]) 01/07/2013   PCP:  [name], FNP Pharmacy:   [facility] [address] [place]) [place] [address] Phone: [phone] Fax: [facility] [address] [place] [address] Phone: [phone] Fax: [phone]     Social Determinants of Health (SDOH) Interventions    Readmission Risk Interventions No flowsheet data found.

## 2021-04-01 LAB — URINE CULTURE: Culture: <10000 — AB

## 2021-04-01 NOTE — Progress Notes (Deleted)
**Note Tiffany-Identified via Obfuscation** Hematology/Oncology  Follow up note Tiffany Bailey Tiffany Bailey [phone] [phone]   Patient Care Team: Tiffany Hawthorne, FNP as PCP - General (Family Medicine) Tiffany Grill, NP as Nurse Practitioner (Nurse Practitioner) Tiffany Coop, NP as Nurse Practitioner (Hospice and Palliative Medicine) Tiffany Server, MD as Consulting Physician (Oncology) Tiffany Bailey, RPH-CPP (Pharmacist) Tiffany Merritts, MD as Consulting Physician (Cardiology)  REFERRING PROVIDER: [name], FNP  CHIEF COMPLAINTS/REASON FOR VISIT:  Follow up for metastatic breast cancer  HISTORY OF PRESENTING ILLNESS:  Tiffany Bailey is a  87 y.o.  female with PMH listed below was seen in consultation at the request of  Tiffany Hawthorne, FNP  for evaluation of abnormal CT scan. Patient was recently evaluated by primary care provider for complaints of urinary retention, headache, constipation. 02/03/2019 x-ray of abdomen showed large colonic stool volume, nodular densities noted in the right mid lung are up). 02/08/2019 subsequent chest x-ray two-view showed hyperinflation of the lungs compatible with COPD.  Multiple nodular densities project over the mid and lower right lung. CT chest was obtained for further evaluation. 02/17/2019 CT chest without contrast Showed multiple bilateral pulmonary nodules,groundglass opacities. Largest solid nodule at the confluence of the right major and minor fissures measuring 1.6 cm.  Most notable groundglass nodule is of the right upper lobe and measure 1.8 x 0.8 cm, small right pleural effusion with associated atelectasis or consolidation.  Larger solid nodule are highly suspicious for metastatic disease. Bulky right mediastinal and hilar lymph node.  Largest pretracheal nodes measuring 3.5 x 3 cm.  There are additional bulky superior mediastinal, supraclavicular, left subpectoral lymph nodes.  Largest subpectoral nodes measuring 3.3 x 2.5  cm.  Patient was sent to cancer center for further evaluation.  #Patient reports a history of left breast cancer status post mastectomy.  She is a poor historian.  She is not able to recall the timeframe of breast cancer diagnosis and then treatments.  Denies any chemotherapy or radiation treatments.  #Left lower extremity history of amputation, CKD, History of MI, Sickle cell disease, DM, HTN, anemia.   Patient denies shortness of breath, chest pain, hemoptysis.  Night sweating, abdominal pain. Reports intentional weight loss.  Appetite is poor.  Not eating much. Also have wax and wane, chronic intermittent headache, she takes Tylenol as needed, with some relief.  Denies any nausea vomiting. She sees neurology. 11/10/2018 CT head showed no acute intracranial abnormality or significant interval changes.  Stable atrophy and white matter disease.  Atherosclerosis.  #Patient had a consultation visit with me on 02/19/2019.  At that time he was referred for abnormal CT scan.  PET scan was obtained and I recommend patient to obtain left axillary lymph node biopsy.  Patient/family called back and decided not to proceed with any additional work-up.  #February 2020 started Arimidex   INTERVAL HISTORY Tiffany Bailey is a 87 y.o. female who has above history reviewed by me today presents for follow up visit for management of metastatic breast cancer. Patient was accompanied by her son Tiffany Bailey.  Patient was seen by primary care provider recently.  She was found to have declining functional status, weakness, decreased appetite and p.o. intake.  She has had increased cough which was felt to be secondary to her breast cancer progression.  CT in January showed progression in the lungs.  She has no shortness of breath.  She reports that her weakness/decreased appetite started about a month ago. Patient started on Jardiance in  the beginning of September 2022 for better control of her blood glucose. For her breast  cancer treatment, she also was started on fulvestrant injections and abemaciclib which she only took for few days and we suggested her to stop due to her declining functional status. She denies any fever, dysuria or increased frequency/urgency seen.   Review of Systems  Constitutional:  Positive for appetite change, fatigue and unexpected weight change. Negative for chills and fever.  HENT:   Negative for hearing loss and voice change.   Eyes:  Negative for eye problems.  Respiratory:  Positive for cough. Negative for chest tightness and shortness of breath.   Cardiovascular:  Negative for chest pain.  Gastrointestinal:  Negative for abdominal distention, abdominal pain, blood in stool and constipation.  Endocrine: Negative for hot flashes.  Genitourinary:  Negative for difficulty urinating and frequency.   Musculoskeletal:  Negative for arthralgias.       Left lower extremity history of amputation, intermittent sharp pain  Skin:  Negative for itching and rash.  Neurological:  Negative for extremity weakness and headaches.  Hematological:  Negative for adenopathy.  Psychiatric/Behavioral:  Negative for confusion.        Sleeps a lot during the day.   MEDICAL HISTORY:  Past Medical History:  Diagnosis Date   Anemia    Anginal pain ([place])    Arthritis    Breast cancer ([place]) 07/19/2019   CKD (chronic kidney disease), stage III ([place])    Coronary artery disease    a. 2002 CABG x 3 (LIMA-LAD, SVG-D1, SVG-RCA); b. s/p multiple PCI's.   Diabetes mellitus without complication ([place])    Diastolic dysfunction    a. 12/2017 Echo: EF 55-60%, no rwma, Gr1 DD. Triv AI. Mild-mod MR/TR. PASP 85mHg.   Edema    Failure to thrive in adult    GERD (gastroesophageal reflux disease)    Hb-SS disease without crisis ([place] 10/23/2017   History of breast cancer    HOH (hard of hearing)    Hyperlipidemia    Hypertension    Myocardial infarction ([facility]    PAD [place]peripheral artery disease) [place]    a. s/p L  AKA; b. 12/2018 PTA R peroneal & PTA/Stenting R SFA.   Palpitations     SURGICAL HISTORY: Past Surgical History:  Procedure Laterality Date   ABDOMINAL HYSTERECTOMY     ABOVE KNEE LEG AMPUTATION Left 2013   ANTERIOR VITRECTOMY Left 11/16/2015   Procedure: ANTERIOR VITRECTOMY;  Surgeon: BEulogio Bear MD;  Location: [facility] ORS;  Service: Ophthalmology;  Laterality: Left;   BLADDER SURGERY     CATARACT EXTRACTION W/PHACO Left 11/16/2015   Procedure: CATARACT EXTRACTION PHACO AND INTRAOCULAR LENS PLACEMENT (IOC);  Surgeon: BEulogio Bear MD;  Location: [facility] ORS;  Service: Ophthalmology;  Laterality: Left;  Lot # [id number] [place] 01:22.8 AP%:12.6 CDE: 10.46   CATARACT EXTRACTION W/PHACO Right 08/22/2016   Procedure: CATARACT EXTRACTION PHACO AND INTRAOCULAR LENS PLACEMENT (IOC);  Surgeon: BEulogio Bear MD;  Location: [facility] ORS;  Service: Ophthalmology;  Laterality: Right;  Lot # [id number] [place] 00:52.1 AP%:8.7 CDE: 4.99    CORONARY ANGIOPLASTY     STENT   CORONARY ARTERY BYPASS GRAFT     LOWER EXTREMITY ANGIOGRAPHY Right 01/14/2019   Procedure: LOWER EXTREMITY ANGIOGRAPHY;  Surgeon: DAlgernon Huxley MD;  [facility];  Service: Cardiovascular;  Laterality: Right;   MASTECTOMY     TUMOR REMOVAL     ABDOMINAL    SOCIAL HISTORY: Social History  Socioeconomic History   Marital status: Divorced    Spouse name: Not on file   Number of children: 7   Years of education: Not on file   Highest education level: Not on file  Occupational History   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  Tobacco Use   Smoking status: Never   Smokeless tobacco: Never  Substance and Sexual Activity   Alcohol use: No   Drug use: No   Sexual activity: Not Currently  Other Topics Concern   Not on file  Social History Narrative   Not on file   Social Determinants of Health   Financial Resource Strain: Low Risk    Difficulty of Paying Living Expenses: Not hard at all  Food  Insecurity: Not on file  Transportation Needs: Not on file  Physical Activity: Not on file  Stress: Not on file  Social Connections: Not on file  Intimate Partner Violence: Not on file    FAMILY HISTORY: Family History  Problem Relation Age of Onset   Sudden death Mother    Arthritis Father    Stroke Father    Breast cancer Sister    Diabetes Grandchild     ALLERGIES:  is allergic to amoxicillin, gabapentin, mirtazapine, and other.  MEDICATIONS:  Current Outpatient Medications  Medication Sig Dispense Refill   abemaciclib (VERZENIO) 150 MG tablet Take 1 tablet (150 mg total) by mouth 2 (two) times daily. Swallow tablets whole. Do not chew, crush, or split tablets before swallowing. 56 tablet 0   amLODipine (NORVASC) 10 MG tablet TAKE 1 TABLET BY MOUTH  DAILY 30 tablet 0   aspirin EC 81 MG tablet Take 1 tablet (81 mg total) by mouth daily. 30 tablet 5   atorvastatin (LIPITOR) 40 MG tablet TAKE 1 TABLET BY MOUTH  DAILY 90 tablet 3   blood glucose meter kit and supplies KIT Dispense based on patient and insurance preference. Use up to four times daily as directed. (FOR ICD-9 250.00, 250.01). 1 each 0   clopidogrel (PLAVIX) 75 MG tablet TAKE 1 TABLET BY MOUTH  DAILY 30 tablet 0   feeding supplement (ENSURE ENLIVE / ENSURE PLUS) LIQD Take 237 mLs by mouth 2 (two) times daily between meals.     ferrous gluconate (FERGON) 324 MG tablet Take 324 mg by mouth 2 (two) times daily.     glucose blood (ONE TOUCH TEST STRIPS) test strip Use to test blood sugar twice daily 200 each 3   hydrALAZINE (APRESOLINE) 10 MG tablet Take 1 tablet (10 mg total) by mouth in the morning and at bedtime. 180 tablet 3   insulin degludec (TRESIBA FLEXTOUCH) 100 UNIT/ML FlexTouch Pen Hold until followup with your outpatient doctor, since you are not eating well, and your blood sugars were low in the Bailey even without insulin. 6 mL 1   Insulin Pen Needle (BD PEN NEEDLE NANO U/F) 32G X 4 MM MISC USE 1 DAILY 100  each 12   Metoprolol Tartrate 37.5 MG TABS Take 1 tablet by mouth 2 (two) times daily.     nitroGLYCERIN (NITROSTAT) 0.4 MG SL tablet Place 1 tablet (0.4 mg total) under the tongue every 5 (five) minutes as needed for chest pain. 25 tablet 4   ONE TOUCH LANCETS MISC Use as directed 2 times per day 200 each 3   vitamin C (ASCORBIC ACID) 250 MG tablet Take 250 mg by mouth daily.     No current facility-administered medications for this visit.  PHYSICAL EXAMINATION: ECOG PERFORMANCE STATUS: 2 - Symptomatic, <50% confined to bed There were no vitals filed for this visit.  There were no vitals filed for this visit.   Physical Exam Constitutional:      General: She is not in acute distress.    Comments: She sits in the wheel chair.  Frail appearance  HENT:     Head: Normocephalic and atraumatic.  Eyes:     General: No scleral icterus.    Pupils: Pupils are equal, round, and reactive to light.  Cardiovascular:     Rate and Rhythm: Normal rate and regular rhythm.     Heart sounds: Normal heart sounds.  Pulmonary:     Effort: Pulmonary effort is normal. No respiratory distress.     Breath sounds: No wheezing.     Comments: Decreased breath sound right lower lobe.  Good air entry on the left base.  Abdominal:     General: Bowel sounds are normal. There is no distension.     Palpations: Abdomen is soft.  Musculoskeletal:        General: No deformity. Normal range of motion.     Cervical back: Normal range of motion and neck supple.     Comments: Left lower extremity history of amputation.   Skin:    General: Skin is warm and dry.     Findings: No erythema or rash.  Neurological:     Mental Status: She is alert and oriented to person, place, and time. Mental status is at baseline.     Cranial Nerves: No cranial nerve deficit.     Coordination: Coordination normal.  Psychiatric:        Mood and Affect: Mood normal.     LABORATORY DATA:  I have reviewed the data as  listed Lab Results  Component Value Date   WBC 7.1 03/31/2021   HGB 10.4 (L) 03/31/2021   HCT 31.1 (L) 03/31/2021   MCV 90.4 03/31/2021   PLT 222 03/31/2021   Recent Labs    03/30/21 1109 03/30/21 1649 03/30/21 1650 03/31/21 0428  NA 129* 136  --  137  K 4.1 4.4  --  3.8  CL 105 108  --  115*  CO2 17* 21*  --  18*  GLUCOSE 86 88  --  51*  BUN 37* 34*  --  31*  CREATININE 1.97* 1.89*  --  1.65*  CALCIUM 8.3* 9.0  --  8.4*  GFRNONAA 24* 25*  --  29*  PROT 6.0*  --  6.5 5.6*  ALBUMIN 3.0*  --  3.1* 2.7*  AST 157*  --  165* 132*  ALT 155*  --  167* 133*  ALKPHOS 78  --  89 78  BILITOT 0.7  --  0.7 0.7  BILIDIR  --   --  0.1  --   IBILI  --   --  0.6  --     Iron/TIBC/Ferritin/ %Sat    Component Value Date/Time   IRON 33 01/12/2021 1133   TIBC 220 (L) 01/12/2021 1133   FERRITIN 74 01/12/2021 1133   IRONPCTSAT 15 01/12/2021 1133   IRONPCTSAT 27 06/19/2018 1006      RADIOGRAPHIC STUDIES: I have personally reviewed the radiological images as listed and agreed with the findings in the report. DG Chest 2 View  Result Date: 03/30/2021 CLINICAL DATA:  Weakness. EXAM: CHEST - 2 VIEW COMPARISON:  Chest x-ray 02/13/2021, CT chest 03/21/2020 chest x-ray 10/17/2017, CT chest 03/01/2021 FINDINGS: The heart  and mediastinal contours are unchanged. Aortic calcification. Coronary artery stents. Hyperinflation of the lungs. Slightly less conspicuous patchy nodular like densities. No pulmonary edema. Blunting of bilateral costophrenic angles with trace bilateral pleural effusions not excluded. No pneumothorax. No acute osseous abnormality. IMPRESSION: Slightly less conspicuous patchy nodular like densities. Electronically Signed   By: Iven Finn M.D.   On: 03/30/2021 17:35   DG Chest 2 View  Result Date: 02/14/2021 CLINICAL DATA:  Persistent cough EXAM: CHEST - 2 VIEW COMPARISON:  CT from 03/21/2020 FINDINGS: Cardiac shadow is within normal limits. Postsurgical changes are noted.  Lungs are well aerated bilaterally. No focal infiltrate is seen. Small right-sided pleural effusion is noted stable from the prior exam with minimal basilar atelectasis. Nodular changes are noted in the right mid lung and right lung base increased from the prior CT examination. Repeat CT is recommended for follow-up. No bony abnormality is seen. IMPRESSION: Increase in nodular changes within the right lung when compare with the prior exam likely related to increasing metastatic disease. CT of the chest with contrast is recommended for further evaluation. These results will be called to the ordering clinician or representative by the Radiologist Assistant, and communication documented in the PACS or [employer]. Electronically Signed   By: [name] M.D.   On: 02/14/2021 09:56   CT Head Wo Contrast  Result Date: 01/07/2021 CLINICAL DATA:  New or worsening headache. EXAM: CT HEAD WITHOUT CONTRAST TECHNIQUE: Contiguous axial images were obtained from the base of the skull through the vertex without intravenous contrast. COMPARISON:  Oct 19, 2019 FINDINGS: Brain: No subdural, epidural, or subarachnoid hemorrhage. Ventricles and sulci are stable. Cerebellum, brainstem, and basal cisterns are normal. Scattered white matter changes again identified. No acute cortical ischemia or infarct. No mass effect or midline shift. Vascular: Calcified atherosclerosis in the intracranial carotids. Skull: Normal. Negative for fracture or focal lesion. Sinuses/Orbits: No acute finding. Other: None. IMPRESSION: No acute intracranial abnormalities are identified to explain the patient's headache. Electronically Signed   By: Dorise Bullion III M.D   On: 01/07/2021 12:52   CT CHEST ABDOMEN PELVIS WO CONTRAST  Result Date: 03/02/2021 CLINICAL DATA:  History of metastatic breast cancer in a 87 year old female EXAM: CT CHEST, ABDOMEN AND PELVIS WITHOUT CONTRAST TECHNIQUE: Multidetector CT imaging of the chest, abdomen and pelvis  was performed following the standard protocol without IV contrast. COMPARISON:  October of 2021 FINDINGS: CT CHEST FINDINGS Cardiovascular: Calcified atheromatous plaque in the thoracic aorta. No aneurysmal dilation. Three-vessel coronary artery disease, abundant three-vessel disease. No substantial pericardial effusion. Normal caliber of central pulmonary vessels. Mediastinum/Nodes: Bulky precarinal/RIGHT paratracheal adenopathy 2.7 cm (image 23/7) previously approximately 1.5 cm. Bulky subcarinal lymphadenopathy (image 26/7) 2.5 cm, previously 1.9 cm. No thoracic inlet adenopathy, axillary adenopathy or internal mammary adenopathy. Smaller lymph nodes identified in the upper mediastinum on the previous study are unchanged. Lungs/Pleura: Enlarging RIGHT-sided effusion now moderate and layering posteriorly multiple pulmonary nodules. (Image 65/8) 1.9 cm greatest axial dimension, RIGHT upper lobe posterior pulmonary nodule with mixed attenuation measuring 1.9 cm previously. (Image 90/8) new 7 x 6 mm pulmonary nodule in the LEFT upper lobe along the pleural surface. (Image 97/8) nodule along an accessory fissure in the RIGHT lower lobe measuring 12 x 9 mm previously 9 mm greatest dimension. (Image 84/8) 7 mm perifissural nodule previously 3 mm. Nodularity along the confluence of fissures in the RIGHT mid chest now with increased thickness at 6 mm greatest thickness, previously 4 mm. This spans 1.8  cm of the fissure previously 1.6 cm. Enlarging Peri fissural nodule (image 46/8) 7 mm previously 2-3 mm. Areas of ground-glass attenuation in the chest in the LEFT chest and RIGHT upper lobe show a similar appearance. Airways are patent. Basilar volume loss on the RIGHT. Musculoskeletal: See below for full musculoskeletal details. CT ABDOMEN PELVIS FINDINGS Hepatobiliary: Normal liver on noncontrast imaging. No pericholecystic stranding. Pancreas: Splenic atrophy without signs of adjacent inflammation. Spleen: Small spleen  as before.  No contour abnormality. Adrenals/Urinary Tract: Adrenal glands are normal. Large RIGHT renal cyst is unchanged. Nephrolithiasis of the bilateral kidneys also similar previous imaging and vascular calcifications as well. No hydronephrosis. No perinephric stranding. Cortical scarring mild-to-moderate in greatest on the RIGHT. Urinary bladder is moderately distended without signs of thickening or adjacent stranding. Stomach/Bowel: No acute gastrointestinal findings. Stomach is under distended limiting assessment. No small bowel dilation. Appendix not seen. No secondary signs to suggest appendiceal inflammation. Abundant stool throughout the colon with mild to moderate distension of the colon but without secondary signs of inflammation. Vascular/Lymphatic: Aortic atherosclerosis. No sign of aneurysm. Smooth contour of the IVC. There is no gastrohepatic or hepatoduodenal ligament lymphadenopathy. No retroperitoneal or mesenteric lymphadenopathy. No pelvic sidewall lymphadenopathy. Reproductive: No adnexal masses.  Post hysterectomy. Other: No ascites.  Small fat containing umbilical hernia. Musculoskeletal: Spinal degenerative changes. 40% loss of height at the L5 level in this osteopenic patient. No priors for comparison of this location since September of 2020. No surrounding stranding. Mild retropulsion of posterior cortical elements without significant canal narrowing. IMPRESSION: Worsening of disease in the chest with enlarging mediastinal adenopathy and pulmonary metastatic disease. Mixed attenuation and ground-glass nodules without change. No evidence of metastatic disease involving the abdomen or pelvis. Enlarging RIGHT-sided pleural effusion now moderate. Abundant stool in the colon, query constipation. L5 compression fracture since available imaging. Correlate with any pain in this location. Findings are age indeterminate and associated with the proximally 40% loss of height. Bilateral  nephrolithiasis and vascular calcifications. Aortic Atherosclerosis (ICD10-I70.0). Electronically Signed   By: Zetta Bills M.D.   On: 03/02/2021 11:25      ASSESSMENT & PLAN:  No diagnosis found.  #Acute kidney failure  Today labs showed acute worsening of her kidney function, creatinine 1.9. This could be combination of decreased appetite/volume depletion. I also suspect that this could be a side effect of Jardiance. Recommend patient to go to emergency room for continuous hydration.  Patient adamantly declined.  We will give 500 cc of IV fluid in the clinic today.  She has very poor IV access.  #Metastatic breast cancer, ER+, PR- HER2 negative.  Disease progression predominantly in her lungs/thoracic lymphadenopathy.  No significant disease in abdomen pelvis. She has been switched to fulvestrant injections. Hold abemaciclib.  #Pleural effusion, moderate.  She denies shortness of breath.  Monitor symptoms. #Transaminitis, etiology unknown.?  Medication side effects. She has had elevated LFTs even before starting her abemaciclib.  Questionably secondary to Jardiance.  #Weakness, likely due to dehydration. Check UA urine culture.  # uncontrolled diabetes.follow up with pcp. Recently started on Jardiance.  Glucose has improved.  Hold Jardiance due to above.  I had a lengthy discussion with both patient and son about goals of care.  Patient has stage IV breast cancer which is an incurable disease.  She may not be candidate for additional treatment if her functional status continues to decrease like this.  She will see palliative care service today.  After her visit, I got a request from  daughter [name] who will want to call back.  I had a lengthy discussion with her again and updated her.  Patient's daughter will try to persuade her mother to go to emergency room for treatment of AKI.  All questions were answered. The patient knows to call the clinic with any problems questions or  concerns. Return of visit: She will follow-up next week for lab MD IV fluid/NP.  Follow-up with me in 2 weeks.  Tiffany Server, MD, PhD 04/01/2021

## 2021-04-02 ENCOUNTER — Inpatient Hospital Stay: Payer: Medicare Other | Admitting: Oncology

## 2021-04-02 ENCOUNTER — Inpatient Hospital Stay: Payer: Medicare Other

## 2021-04-02 NOTE — Progress Notes (Signed)
Cardiology Office Note:    Date:  04/04/2021   ID:  Tiffany Bailey, DOB 14-Dec-1927, MRN [id number]  PCP:  [name], FNP  [facility] Cardiologist:  Dr. Ar[facility] Electrophysiologist:  None   Referring MD: [name], FNP   Chief Complaint: 1 month follow-up  History of Present Illness:    Tiffany Bailey is a 87 y.o. female with a hx of  CAD s/p CABG, CKD, GERD, PAD s/p left AKA, DM2 on insulin, HTN, arthritis, metastatic breast cancer who presents for follow-up for CAD.    CAD s/p CABG x3 in 2002 (LIMA-LAD, SVG-D1, SVG-RCA). July 2019 echo showed LVEF 55-60%, normal WMA, G1DD, mild to mod MR, mild to mod TR, PASP 68mHg.    Hospitalized July 2020 for PAD and she was followed by [facility] for worsening right-sided PAD with ulceration. Mild disease noted at the common femoral artery with severe disease of profunda femoris artery beyond the primary branches with poor collateral flow. Underwent angioplasty. She was recommended for medical management, walking regimen, antiplatelet agent. 04/2019 she had repeat studies with ABI 1.5 and TBI 0.7 of LLE.   Seen 03/02/21 and reported high Bps. There was confusing regarding medications with another provider making changes .hydralazine was decreased down to 123mTID.   Admitted 10/14-10/15 for dehydration/AKI. Jaridance, clonidine, and insulin were held.   Today, the patient reports she is feeling a lot better than before. She feels she has some more strength. Has nurse come in once a month. BP mildly elevated, 146/60. She checks BP at home, gets lower numbers 116-120s/60-70s. No chest pain, shortness of breath, LLE, orthopnea, pnd. Has been dehydrated, recently started Jardiance, but caused worsening kidney function. PCP is following.    Past Medical History:  Diagnosis Date   Anemia    Anginal pain ([place]   Arthritis    Breast cancer ([place]CM[place]/06/[place]   CKD (chronic kidney disease), stage III ([facility]   Coronary  artery disease    a. 2002 CABG x 3 (LIMA-LAD, SVG-D1, SVG-RCA); b. s/p multiple PCI's.   Diabetes mellitus without complication (HCH[place]   Diastolic dysfunction    a. 12/2017 Echo: EF 55-60%, no rwma, Gr1 DD. Triv AI. Mild-mod MR/TR. PASP 4440m.   Edema    Failure to thrive in adult    GERD (gastroesophageal reflux disease)    Hb-SS disease without crisis ([place]CC[place]5/02/2018   History of breast cancer    HOH (hard of hearing)    Hyperlipidemia    Hypertension    [facility]  PAD (peripheral artery disease) (HCC[place]  a. s/p L AKA; b. 12/2018 PTA R peroneal & PTA/Stenting R SFA.   Palpitations     Past Surgical History:  Procedure Laterality Date   ABDOMINAL HYSTERECTOMY     ABOVE KNEE LEG AMPUTATION Left 2013   ANTERIOR VITRECTOMY Left 11/16/2015   Procedure: ANTERIOR VITRECTOMY;  Surgeon: BraEulogio BearD;  Location: [facility] ORS;  Service: Ophthalmology;  Laterality: Left;   BLADDER SURGERY     CATARACT EXTRACTION W/PHACO Left 11/16/2015   Procedure: CATARACT EXTRACTION PHACO AND INTRAOCULAR LENS PLACEMENT (IOC);  Surgeon: BraEulogio BearD;  Location: [facility] ORS;  Service: Ophthalmology;  Laterality: Left;  Lot # [id number];[place]1:22.8 AP%:12.6 CDE: 10.46   CATARACT EXTRACTION W/PHACO Right 08/22/2016   Procedure: CATARACT EXTRACTION PHACO AND INTRAOCULAR LENS PLACEMENT (IOC);  Surgeon: BraEulogio BearD;  Location: [facility] ORS;  Service:  Ophthalmology;  Laterality: Right;  Lot # [id number] H [place]: 00:52.1 AP%:8.7 CDE: 4.99    CORONARY ANGIOPLASTY     STENT   CORONARY ARTERY BYPASS GRAFT     LOWER EXTREMITY ANGIOGRAPHY Right 01/14/2019   Procedure: LOWER EXTREMITY ANGIOGRAPHY;  Surgeon: Algernon Huxley, MD;  Location: [facility];  Service: Cardiovascular;  Laterality: Right;   MASTECTOMY     TUMOR REMOVAL     ABDOMINAL    Current Medications: Current Meds  Medication Sig   abemaciclib (VERZENIO) 150 MG tablet Take 1 tablet (150 mg total) by mouth 2 (two)  times daily. Swallow tablets whole. Do not chew, crush, or split tablets before swallowing.   amLODipine (NORVASC) 10 MG tablet TAKE 1 TABLET BY MOUTH  DAILY   aspirin EC 81 MG tablet Take 1 tablet (81 mg total) by mouth daily.   atorvastatin (LIPITOR) 40 MG tablet TAKE 1 TABLET BY MOUTH  DAILY   blood glucose meter kit and supplies KIT Dispense based on patient and insurance preference. Use up to four times daily as directed. (FOR ICD-9 250.00, 250.01).   clopidogrel (PLAVIX) 75 MG tablet TAKE 1 TABLET BY MOUTH  DAILY   feeding supplement (ENSURE ENLIVE / ENSURE PLUS) LIQD Take 237 mLs by mouth 2 (two) times daily between meals.   ferrous gluconate (FERGON) 324 MG tablet Take 324 mg by mouth 2 (two) times daily.   glucose blood (ONE TOUCH TEST STRIPS) test strip Use to test blood sugar twice daily   hydrALAZINE (APRESOLINE) 10 MG tablet Take 1 tablet (10 mg total) by mouth in the morning and at bedtime.   insulin degludec (TRESIBA FLEXTOUCH) 100 UNIT/ML FlexTouch Pen Hold until followup with your outpatient doctor, since you are not eating well, and your blood sugars were low in the hospital even without insulin.   Insulin Pen Needle (BD PEN NEEDLE NANO U/F) 32G X 4 MM MISC USE 1 DAILY   Metoprolol Tartrate 37.5 MG TABS Take 1 tablet by mouth 2 (two) times daily.   nitroGLYCERIN (NITROSTAT) 0.4 MG SL tablet Place 1 tablet (0.4 mg total) under the tongue every 5 (five) minutes as needed for chest pain.   ONE TOUCH LANCETS MISC Use as directed 2 times per day   vitamin C (ASCORBIC ACID) 250 MG tablet Take 250 mg by mouth daily.     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  Tobacco Use   Smoking status:  Never   Smokeless tobacco: Never  Substance and Sexual Activity   Alcohol use: No   Drug use: No   Sexual activity: Not Currently  Other Topics Concern   Not on file  Social History Narrative   Not on file   Social Determinants of Health   Financial Resource Strain: Low Risk    Difficulty of Paying Living Expenses: Not hard at all  Food Insecurity: Not on file  Transportation Needs: Not on file  Physical Activity: Not on file  Stress: Not on file  Social Connections: Not on file     Family History: The patient's family history includes Arthritis in her father; Breast cancer in her sister; Diabetes  in her grandchild; Stroke in her father; Sudden death in her mother.  ROS:   Please see the history of present illness.     All other systems reviewed and are negative.  EKGs/Labs/Other Studies Reviewed:    The following studies were reviewed today:  Echo 2019 Study Conclusions   - Left ventricle: The cavity size was normal. Systolic function was    normal. The estimated ejection fraction was in the range of 55%    to 60%. Wall motion was normal; there were no regional wall    motion abnormalities. Doppler parameters are consistent with    abnormal left ventricular relaxation (grade 1 diastolic    dysfunction).  - Aortic valve: There was trivial regurgitation.  - Mitral valve: There was mild to moderate regurgitation.  - Left atrium: The atrium was normal in size.  - Right ventricle: Systolic function was normal.  - Tricuspid valve: There was mild-moderate regurgitation.  - Pulmonary arteries: Systolic pressure was mildly elevated. PA    peak pressure: 44 mm Hg (S).   Impressions:   - Aortic valve sclerosis without significant stenosis.   EKG:  EKG is  ordered today.  The ekg ordered today demonstrates NSR, HR 82bpm, IVCD, Qtc 486m, nonspecific T wave changes  Recent Labs: 01/12/2021: TSH 2.859 03/31/2021: Hemoglobin 10.4; Platelets 222 04/03/2021: ALT 100; BUN 27;  Creatinine, Ser 1.88; Potassium 4.1; Sodium 133  Recent Lipid Panel    Component Value Date/Time   CHOL 136 02/26/2019 0828   TRIG 63.0 02/26/2019 0828   HDL 46.50 02/26/2019 0828   CHOLHDL 3 02/26/2019 0828   VLDL 12.6 02/26/2019 0828   LDLCALC 76 02/26/2019 0828     Risk Assessment/Calculations:       Physical Exam:    VS:  BP (!) 146/60 (BP Location: Right Arm, Patient Position: Sitting, Cuff Size: Normal)   Pulse 82   Ht 5' (1.524 m)   Wt 88 lb (39.9 kg)   SpO2 97%   BMI 17.19 kg/m     Wt Readings from Last 3 Encounters:  04/04/21 88 lb (39.9 kg)  03/30/21 93 lb 11.1 oz (42.5 kg)  03/30/21 88 lb (39.9 kg)     GEN:  Well nourished, well developed in no acute distress HEENT: Normal NECK: No JVD; No carotid bruits LYMPHATICS: No lymphadenopathy CARDIAC: RRR, no murmurs, rubs, gallops RESPIRATORY:  Clear to auscultation without rales, wheezing or rhonchi  ABDOMEN: Soft, non-tender, non-distended MUSCULOSKELETAL:  No edema; No deformity  SKIN: Warm and dry NEUROLOGIC:  Alert and oriented x 3 PSYCHIATRIC:  Normal affect   ASSESSMENT:    1. Coronary artery disease of native artery of native heart with stable angina pectoris (HCalhoun [place]   2. PAD (peripheral artery disease) (HDescanso   3. Essential hypertension   4. Hyperlipidemia, mixed   5. CKD stage 3 due to type 2 diabetes mellitus (HPhil[place]   6. Type 2 diabetes mellitus with other circulatory complication, with long-term current use of insulin (HCC)    PLAN:    In order of problems listed above:  CAD s/p CABG x3 Patient denies anginal symptoms, however she is sedentary at baseline given L BKA. EKG with no changes. No indication for ischemic testing at this time. Continue Aspirin and plavix with CAD/PAD. Continue statin and BB.    PAD s/p Left AKA No signs of worsening claudication however patient mostly sedentary. Continue secondary prevention with Aspirin, statin, plavix.    HTN BP today 146/60. At the last visit  patient was feeling worse with increased dose of hydralazine, this was decreased back down to 72m BID. Since then the patient reports she is feeling better. Says blood pressures at home range 110-130s/60-70s. She feels she has more energy. Clonidine stopped after recent admission for dehydration/AKI. Continue amlodipine 161mdaily, Hydralazine 1064mid, and Metoprolol 37.5mg52m. No further changes at this point.     HLD LDL 76 02/2019. Continue Lipitor 40mg6mly. Can re-check at follow-up.    CKD Recent admission for dehydration and AKI suspected from JardiOlganidine and insuline stopped at discharge. Labs being followed by PCP. Most recent labs show Scr 1.88/27. Increased water intake encouraged.    DM2 A1C 8.6 02/13/21.  Followed by PCP.  Disposition: Follow up in 3 month(s) with APP/MD   Signed, Delontae Lamm H FurNinfa MeekerC  04/04/2021 11:03 AM    [facility]

## 2021-04-03 ENCOUNTER — Inpatient Hospital Stay: Payer: Medicare Other

## 2021-04-03 ENCOUNTER — Ambulatory Visit: Payer: Medicare Other | Admitting: Oncology

## 2021-04-03 ENCOUNTER — Inpatient Hospital Stay (HOSPITAL_BASED_OUTPATIENT_CLINIC_OR_DEPARTMENT_OTHER): Payer: Medicare Other | Admitting: Oncology

## 2021-04-03 ENCOUNTER — Other Ambulatory Visit: Payer: Medicare Other

## 2021-04-03 ENCOUNTER — Encounter: Payer: Self-pay | Admitting: Oncology

## 2021-04-03 ENCOUNTER — Telehealth: Payer: Self-pay

## 2021-04-03 ENCOUNTER — Other Ambulatory Visit: Payer: Self-pay

## 2021-04-03 VITALS — BP 148/51 | HR 70 | Temp 97.9°F | Resp 20

## 2021-04-03 DIAGNOSIS — E1122 Type 2 diabetes mellitus with diabetic chronic kidney disease: Secondary | ICD-10-CM | POA: Diagnosis not present

## 2021-04-03 DIAGNOSIS — R531 Weakness: Secondary | ICD-10-CM

## 2021-04-03 DIAGNOSIS — N1831 Chronic kidney disease, stage 3a: Secondary | ICD-10-CM | POA: Diagnosis not present

## 2021-04-03 DIAGNOSIS — E860 Dehydration: Secondary | ICD-10-CM | POA: Diagnosis not present

## 2021-04-03 DIAGNOSIS — Z803 Family history of malignant neoplasm of breast: Secondary | ICD-10-CM | POA: Diagnosis not present

## 2021-04-03 DIAGNOSIS — D631 Anemia in chronic kidney disease: Secondary | ICD-10-CM | POA: Insufficient documentation

## 2021-04-03 DIAGNOSIS — C50919 Malignant neoplasm of unspecified site of unspecified female breast: Secondary | ICD-10-CM

## 2021-04-03 DIAGNOSIS — I129 Hypertensive chronic kidney disease with stage 1 through stage 4 chronic kidney disease, or unspecified chronic kidney disease: Secondary | ICD-10-CM | POA: Insufficient documentation

## 2021-04-03 DIAGNOSIS — Z79811 Long term (current) use of aromatase inhibitors: Secondary | ICD-10-CM | POA: Insufficient documentation

## 2021-04-03 DIAGNOSIS — Z89612 Acquired absence of left leg above knee: Secondary | ICD-10-CM | POA: Insufficient documentation

## 2021-04-03 DIAGNOSIS — D571 Sickle-cell disease without crisis: Secondary | ICD-10-CM | POA: Insufficient documentation

## 2021-04-03 DIAGNOSIS — I252 Old myocardial infarction: Secondary | ICD-10-CM | POA: Insufficient documentation

## 2021-04-03 DIAGNOSIS — M8580 Other specified disorders of bone density and structure, unspecified site: Secondary | ICD-10-CM | POA: Diagnosis not present

## 2021-04-03 LAB — COMPREHENSIVE METABOLIC PANEL
ALT: 100 U/L — ABNORMAL HIGH (ref 0–44)
AST: 80 U/L — ABNORMAL HIGH (ref 15–41)
Albumin: 3.2 g/dL — ABNORMAL LOW (ref 3.5–5.0)
Alkaline Phosphatase: 78 U/L (ref 38–126)
Anion gap: 6 (ref 5–15)
BUN: 27 mg/dL — ABNORMAL HIGH (ref 8–23)
CO2: 18 mmol/L — ABNORMAL LOW (ref 22–32)
Calcium: 8.4 mg/dL — ABNORMAL LOW (ref 8.9–10.3)
Chloride: 109 mmol/L (ref 98–111)
Creatinine, Ser: 1.88 mg/dL — ABNORMAL HIGH (ref 0.44–1.00)
GFR, Estimated: 25 mL/min — ABNORMAL LOW (ref >60)
Glucose, Bld: 126 mg/dL — ABNORMAL HIGH (ref 70–99)
Potassium: 4.1 mmol/L (ref 3.5–5.1)
Sodium: 133 mmol/L — ABNORMAL LOW (ref 135–145)
Total Bilirubin: 0.4 mg/dL (ref 0.3–1.2)
Total Protein: 6.4 g/dL — ABNORMAL LOW (ref 6.5–8.1)

## 2021-04-03 NOTE — Progress Notes (Signed)
2 RNs attempted IV insertion unsuccessfully. Patient is refusing to be stuck again. Patient said her daughter bought her a pack of gator aid. Instructed her to drink at least 2 bottles per day of the gator aid. Pt said she would try to drink more fluids. NP spoke to daughter [name] by phone prior to discharge from clinic today.

## 2021-04-03 NOTE — Telephone Encounter (Signed)
Transition Care Management Unsuccessful Follow-up Telephone Call  Date of discharge and from where:  03/31/21-[facility]  Attempts:  1st Attempt  Reason for unsuccessful TCM follow-up call:  No answer/busy. HFU tentatively scheduled. Will follow.

## 2021-04-03 NOTE — Progress Notes (Signed)
Hematology/Oncology  Follow up note [facility] Telephone:([phone] [phone]   Patient Care Team: [name], FNP as PCP - General (Family Medicine) Lauretta Grill, NP as Nurse Practitioner (Nurse Practitioner) Jason Coop, NP as Nurse Practitioner (Hospice and Palliative Medicine) [name], MD as Consulting Physician (Oncology) De Hollingshead, RPH-CPP (Pharmacist) Minna Merritts, MD as Consulting Physician (Cardiology)  REFERRING PROVIDER: [name], FNP  CHIEF COMPLAINTS/REASON FOR VISIT:  Follow up for metastatic breast cancer  HISTORY OF PRESENTING ILLNESS:  Tiffany Bailey is a  87 y.o.  female with PMH listed below was seen in consultation at the request of  [name], FNP  for evaluation of abnormal CT scan. Patient was recently evaluated by primary care provider for complaints of urinary retention, headache, constipation. 02/03/2019 x-ray of abdomen showed large colonic stool volume, nodular densities noted in the right mid lung are up). 02/08/2019 subsequent chest x-ray two-view showed hyperinflation of the lungs compatible with COPD.  Multiple nodular densities project over the mid and lower right lung. CT chest was obtained for further evaluation. 02/17/2019 CT chest without contrast Showed multiple bilateral pulmonary nodules,groundglass opacities. Largest solid nodule at the confluence of the right major and minor fissures measuring 1.6 cm.  Most notable groundglass nodule is of the right upper lobe and measure 1.8 x 0.8 cm, small right pleural effusion with associated atelectasis or consolidation.  Larger solid nodule are highly suspicious for metastatic disease. Bulky right mediastinal and hilar lymph node.  Largest pretracheal nodes measuring 3.5 x 3 cm.  There are additional bulky superior mediastinal, supraclavicular, left subpectoral lymph nodes.  Largest subpectoral nodes measuring 3.3 x 2.5  cm.  Patient was sent to cancer center for further evaluation.  #Patient reports a history of left breast cancer status post mastectomy.  She is a poor historian.  She is not able to recall the timeframe of breast cancer diagnosis and then treatments.  Denies any chemotherapy or radiation treatments.  #Left lower extremity history of amputation, CKD, History of MI, Sickle cell disease, DM, HTN, anemia.   Patient denies shortness of breath, chest pain, hemoptysis.  Night sweating, abdominal pain. Reports intentional weight loss.  Appetite is poor.  Not eating much. Also have wax and wane, chronic intermittent headache, she takes Tylenol as needed, with some relief.  Denies any nausea vomiting. She sees neurology. 11/10/2018 CT head showed no acute intracranial abnormality or significant interval changes.  Stable atrophy and white matter disease.  Atherosclerosis.  #Patient had a consultation visit with me on 02/19/2019.  At that time he was referred for abnormal CT scan.  PET scan was obtained and I recommend patient to obtain left axillary lymph node biopsy.  Patient/family called back and decided not to proceed with any additional work-up.  #February 2020 started Arimidex   INTERVAL HISTORY Tiffany Bailey presents to clinic today for follow-up after recent hospitalization for weakness.  Labs showed acute kidney injury with creatinine 1.97.  She was given IV fluids with improvement prior to discharge.  Per family, she has declining performance status, generalized weakness, sleeping a lot and decreased appetite with poor oral intake.  States she drinks 1 boost per day and very little water.  Overall feels stronger.  States she is able to stand and get around better.  She was briefly on Verzenio for her metastatic breast cancer but due to declining functional status this was discontinued.  Review of Systems  Constitutional:  Positive  for appetite change and fatigue. Negative for fever and unexpected  weight change.  HENT:   Negative for nosebleeds, sore throat and trouble swallowing.   Eyes: Negative.   Respiratory: Negative.  Negative for cough, shortness of breath and wheezing.   Cardiovascular: Negative.  Negative for chest pain and leg swelling.  Gastrointestinal:  Negative for abdominal pain, blood in stool, constipation, diarrhea, nausea and vomiting.  Endocrine: Negative.   Genitourinary: Negative.  Negative for bladder incontinence, hematuria and nocturia.   Musculoskeletal:  Positive for gait problem. Negative for back pain and flank pain.  Skin: Negative.   Neurological:  Positive for gait problem. Negative for dizziness, headaches, light-headedness and numbness.  Hematological: Negative.   Psychiatric/Behavioral: Negative.  Negative for confusion. The patient is not nervous/anxious.    MEDICAL HISTORY:  Past Medical History:  Diagnosis Date   Anemia    Anginal pain ([place])    Arthritis    Breast cancer ([place]) 07/19/2019   CKD (chronic kidney disease), stage III ([place])    Coronary artery disease    a. 2002 CABG x 3 (LIMA-LAD, SVG-D1, SVG-RCA); b. s/p multiple PCI's.   Diabetes mellitus without complication ([place])    Diastolic dysfunction    a. 12/2017 Echo: EF 55-60%, no rwma, Gr1 DD. Triv AI. Mild-mod MR/TR. PASP 73mHg.   Edema    Failure to thrive in adult    GERD (gastroesophageal reflux disease)    Hb-SS disease without crisis ([place] 10/23/2017   History of breast cancer    HOH (hard of hearing)    Hyperlipidemia    Hypertension    Myocardial infarction ([facility]    PAD (peripheral artery disease) ([place]    a. s/p L AKA; b. 12/2018 PTA R peroneal & PTA/Stenting R SFA.   Palpitations     SURGICAL HISTORY: Past Surgical History:  Procedure Laterality Date   ABDOMINAL HYSTERECTOMY     ABOVE KNEE LEG AMPUTATION Left 2013   ANTERIOR VITRECTOMY Left 11/16/2015   Procedure: ANTERIOR VITRECTOMY;  Surgeon: BEulogio Bear MD;  Location: [facility] ORS;  Service: Ophthalmology;   Laterality: Left;   BLADDER SURGERY     CATARACT EXTRACTION W/PHACO Left 11/16/2015   Procedure: CATARACT EXTRACTION PHACO AND INTRAOCULAR LENS PLACEMENT (IOC);  Surgeon: BEulogio Bear MD;  Location: [facility] ORS;  Service: Ophthalmology;  Laterality: Left;  Lot # [id number] [place] 01:22.8 AP%:12.6 CDE: 10.46   CATARACT EXTRACTION W/PHACO Right 08/22/2016   Procedure: CATARACT EXTRACTION PHACO AND INTRAOCULAR LENS PLACEMENT (IOC);  Surgeon: BEulogio Bear MD;  Location: [facility] ORS;  Service: Ophthalmology;  Laterality: Right;  Lot # [id number] [place] 00:52.1 AP%:8.7 CDE: 4.99    CORONARY ANGIOPLASTY     STENT   CORONARY ARTERY BYPASS GRAFT     LOWER EXTREMITY ANGIOGRAPHY Right 01/14/2019   Procedure: LOWER EXTREMITY ANGIOGRAPHY;  Surgeon: DAlgernon Huxley MD;  [facility];  Service: Cardiovascular;  Laterality: Right;   MASTECTOMY     TUMOR REMOVAL     ABDOMINAL    SOCIAL HISTORY: Social History   Socioeconomic History   Marital status: Divorced    Spouse name: Not on file   Number of children: 7   Years of education: Not on file   Highest education level: Not on file  Occupational History   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  Tobacco Use   Smoking status: Never   Smokeless tobacco: Never  Substance and Sexual Activity   Alcohol use: No  Drug use: No   Sexual activity: Not Currently  Other Topics Concern   Not on file  Social History Narrative   Not on file   Social Determinants of Health   Financial Resource Strain: Low Risk    Difficulty of Paying Living Expenses: Not hard at all  Food Insecurity: Not on file  Transportation Needs: Not on file  Physical Activity: Not on file  Stress: Not on file  Social Connections: Not on file  Intimate Partner Violence: Not on file    FAMILY HISTORY: Family History  Problem Relation Age of Onset   Sudden death Mother    Arthritis Father    Stroke Father    Breast cancer Sister    Diabetes  Grandchild     ALLERGIES:  is allergic to amoxicillin, gabapentin, mirtazapine, and other.  MEDICATIONS:  Current Outpatient Medications  Medication Sig Dispense Refill   abemaciclib (VERZENIO) 150 MG tablet Take 1 tablet (150 mg total) by mouth 2 (two) times daily. Swallow tablets whole. Do not chew, crush, or split tablets before swallowing. 56 tablet 0   amLODipine (NORVASC) 10 MG tablet TAKE 1 TABLET BY MOUTH  DAILY 30 tablet 0   aspirin EC 81 MG tablet Take 1 tablet (81 mg total) by mouth daily. 30 tablet 5   atorvastatin (LIPITOR) 40 MG tablet TAKE 1 TABLET BY MOUTH  DAILY 90 tablet 3   blood glucose meter kit and supplies KIT Dispense based on patient and insurance preference. Use up to four times daily as directed. (FOR ICD-9 250.00, 250.01). 1 each 0   clopidogrel (PLAVIX) 75 MG tablet TAKE 1 TABLET BY MOUTH  DAILY 30 tablet 0   feeding supplement (ENSURE ENLIVE / ENSURE PLUS) LIQD Take 237 mLs by mouth 2 (two) times daily between meals.     ferrous gluconate (FERGON) 324 MG tablet Take 324 mg by mouth 2 (two) times daily.     glucose blood (ONE TOUCH TEST STRIPS) test strip Use to test blood sugar twice daily 200 each 3   hydrALAZINE (APRESOLINE) 10 MG tablet Take 1 tablet (10 mg total) by mouth in the morning and at bedtime. 180 tablet 3   insulin degludec (TRESIBA FLEXTOUCH) 100 UNIT/ML FlexTouch Pen Hold until followup with your outpatient doctor, since you are not eating well, and your blood sugars were low in the hospital even without insulin. 6 mL 1   Insulin Pen Needle (BD PEN NEEDLE NANO U/F) 32G X 4 MM MISC USE 1 DAILY 100 each 12   Metoprolol Tartrate 37.5 MG TABS Take 1 tablet by mouth 2 (two) times daily.     nitroGLYCERIN (NITROSTAT) 0.4 MG SL tablet Place 1 tablet (0.4 mg total) under the tongue every 5 (five) minutes as needed for chest pain. 25 tablet 4   ONE TOUCH LANCETS MISC Use as directed 2 times per day 200 each 3   vitamin C (ASCORBIC ACID) 250 MG tablet Take  250 mg by mouth daily.     No current facility-administered medications for this visit.     PHYSICAL EXAMINATION: ECOG PERFORMANCE STATUS: 2 - Symptomatic, <50% confined to bed Vitals:   04/03/21 1020  BP: (!) 148/51  Pulse: 70  Resp: 20  Temp: 97.9 F (36.6 C)  SpO2: 100%   There were no vitals filed for this visit.   Physical Exam Constitutional:      Appearance: Normal appearance.  HENT:     Head: Normocephalic and atraumatic.  Eyes:  Pupils: Pupils are equal, round, and reactive to light.  Cardiovascular:     Rate and Rhythm: Normal rate and regular rhythm.     Heart sounds: Normal heart sounds. No murmur heard. Pulmonary:     Effort: Pulmonary effort is normal.     Breath sounds: Normal breath sounds. No wheezing.  Abdominal:     General: Bowel sounds are normal. There is no distension.     Palpations: Abdomen is soft.     Tenderness: There is no abdominal tenderness.  Musculoskeletal:        General: Normal range of motion.     Cervical back: Normal range of motion.  Skin:    General: Skin is warm and dry.     Findings: No rash.  Neurological:     Mental Status: She is alert and oriented to person, place, and time.  Psychiatric:        Judgment: Judgment normal.     LABORATORY DATA:  I have reviewed the data as listed Lab Results  Component Value Date   WBC 7.1 03/31/2021   HGB 10.4 (L) 03/31/2021   HCT 31.1 (L) 03/31/2021   MCV 90.4 03/31/2021   PLT 222 03/31/2021   Recent Labs    03/30/21 1649 03/30/21 1650 03/31/21 0428 04/03/21 0955  NA 136  --  137 133*  K 4.4  --  3.8 4.1  CL 108  --  115* 109  CO2 21*  --  18* 18*  GLUCOSE 88  --  51* 126*  BUN 34*  --  31* 27*  CREATININE 1.89*  --  1.65* 1.88*  CALCIUM 9.0  --  8.4* 8.4*  GFRNONAA 25*  --  29* 25*  PROT  --  6.5 5.6* 6.4*  ALBUMIN  --  3.1* 2.7* 3.2*  AST  --  165* 132* 80*  ALT  --  167* 133* 100*  ALKPHOS  --  89 78 78  BILITOT  --  0.7 0.7 0.4  BILIDIR  --  0.1  --    --   IBILI  --  0.6  --   --    Iron/TIBC/Ferritin/ %Sat    Component Value Date/Time   IRON 33 01/12/2021 1133   TIBC 220 (L) 01/12/2021 1133   FERRITIN 74 01/12/2021 1133   IRONPCTSAT 15 01/12/2021 1133   IRONPCTSAT 27 06/19/2018 1006      RADIOGRAPHIC STUDIES: I have personally reviewed the radiological images as listed and agreed with the findings in the report. DG Chest 2 View  Result Date: 03/30/2021 CLINICAL DATA:  Weakness. EXAM: CHEST - 2 VIEW COMPARISON:  Chest x-ray 02/13/2021, CT chest 03/21/2020 chest x-ray 10/17/2017, CT chest 03/01/2021 FINDINGS: The heart and mediastinal contours are unchanged. Aortic calcification. Coronary artery stents. Hyperinflation of the lungs. Slightly less conspicuous patchy nodular like densities. No pulmonary edema. Blunting of bilateral costophrenic angles with trace bilateral pleural effusions not excluded. No pneumothorax. No acute osseous abnormality. IMPRESSION: Slightly less conspicuous patchy nodular like densities. Electronically Signed   By: Iven Finn M.D.   On: 03/30/2021 17:35   DG Chest 2 View  Result Date: 02/14/2021 CLINICAL DATA:  Persistent cough EXAM: CHEST - 2 VIEW COMPARISON:  CT from 03/21/2020 FINDINGS: Cardiac shadow is within normal limits. Postsurgical changes are noted. Lungs are well aerated bilaterally. No focal infiltrate is seen. Small right-sided pleural effusion is noted stable from the prior exam with minimal basilar atelectasis. Nodular changes are noted in the right mid  lung and right lung base increased from the prior CT examination. Repeat CT is recommended for follow-up. No bony abnormality is seen. IMPRESSION: Increase in nodular changes within the right lung when compare with the prior exam likely related to increasing metastatic disease. CT of the chest with contrast is recommended for further evaluation. These results will be called to the ordering clinician or representative by the Radiologist  Assistant, and communication documented in the PACS or [employer]. Electronically Signed   By: [name] M.D.   On: 02/14/2021 09:56   CT Head Wo Contrast  Result Date: 01/07/2021 CLINICAL DATA:  New or worsening headache. EXAM: CT HEAD WITHOUT CONTRAST TECHNIQUE: Contiguous axial images were obtained from the base of the skull through the vertex without intravenous contrast. COMPARISON:  Oct 19, 2019 FINDINGS: Brain: No subdural, epidural, or subarachnoid hemorrhage. Ventricles and sulci are stable. Cerebellum, brainstem, and basal cisterns are normal. Scattered white matter changes again identified. No acute cortical ischemia or infarct. No mass effect or midline shift. Vascular: Calcified atherosclerosis in the intracranial carotids. Skull: Normal. Negative for fracture or focal lesion. Sinuses/Orbits: No acute finding. Other: None. IMPRESSION: No acute intracranial abnormalities are identified to explain the patient's headache. Electronically Signed   By: Dorise Bullion III M.D   On: 01/07/2021 12:52   CT CHEST ABDOMEN PELVIS WO CONTRAST  Result Date: 03/02/2021 CLINICAL DATA:  History of metastatic breast cancer in a 87 year old female EXAM: CT CHEST, ABDOMEN AND PELVIS WITHOUT CONTRAST TECHNIQUE: Multidetector CT imaging of the chest, abdomen and pelvis was performed following the standard protocol without IV contrast. COMPARISON:  October of 2021 FINDINGS: CT CHEST FINDINGS Cardiovascular: Calcified atheromatous plaque in the thoracic aorta. No aneurysmal dilation. Three-vessel coronary artery disease, abundant three-vessel disease. No substantial pericardial effusion. Normal caliber of central pulmonary vessels. Mediastinum/Nodes: Bulky precarinal/RIGHT paratracheal adenopathy 2.7 cm (image 23/7) previously approximately 1.5 cm. Bulky subcarinal lymphadenopathy (image 26/7) 2.5 cm, previously 1.9 cm. No thoracic inlet adenopathy, axillary adenopathy or internal mammary adenopathy. Smaller  lymph nodes identified in the upper mediastinum on the previous study are unchanged. Lungs/Pleura: Enlarging RIGHT-sided effusion now moderate and layering posteriorly multiple pulmonary nodules. (Image 65/8) 1.9 cm greatest axial dimension, RIGHT upper lobe posterior pulmonary nodule with mixed attenuation measuring 1.9 cm previously. (Image 90/8) new 7 x 6 mm pulmonary nodule in the LEFT upper lobe along the pleural surface. (Image 97/8) nodule along an accessory fissure in the RIGHT lower lobe measuring 12 x 9 mm previously 9 mm greatest dimension. (Image 84/8) 7 mm perifissural nodule previously 3 mm. Nodularity along the confluence of fissures in the RIGHT mid chest now with increased thickness at 6 mm greatest thickness, previously 4 mm. This spans 1.8 cm of the fissure previously 1.6 cm. Enlarging Peri fissural nodule (image 46/8) 7 mm previously 2-3 mm. Areas of ground-glass attenuation in the chest in the LEFT chest and RIGHT upper lobe show a similar appearance. Airways are patent. Basilar volume loss on the RIGHT. Musculoskeletal: See below for full musculoskeletal details. CT ABDOMEN PELVIS FINDINGS Hepatobiliary: Normal liver on noncontrast imaging. No pericholecystic stranding. Pancreas: Splenic atrophy without signs of adjacent inflammation. Spleen: Small spleen as before.  No contour abnormality. Adrenals/Urinary Tract: Adrenal glands are normal. Large RIGHT renal cyst is unchanged. Nephrolithiasis of the bilateral kidneys also similar previous imaging and vascular calcifications as well. No hydronephrosis. No perinephric stranding. Cortical scarring mild-to-moderate in greatest on the RIGHT. Urinary bladder is moderately distended without signs of thickening or adjacent  stranding. Stomach/Bowel: No acute gastrointestinal findings. Stomach is under distended limiting assessment. No small bowel dilation. Appendix not seen. No secondary signs to suggest appendiceal inflammation. Abundant stool  throughout the colon with mild to moderate distension of the colon but without secondary signs of inflammation. Vascular/Lymphatic: Aortic atherosclerosis. No sign of aneurysm. Smooth contour of the IVC. There is no gastrohepatic or hepatoduodenal ligament lymphadenopathy. No retroperitoneal or mesenteric lymphadenopathy. No pelvic sidewall lymphadenopathy. Reproductive: No adnexal masses.  Post hysterectomy. Other: No ascites.  Small fat containing umbilical hernia. Musculoskeletal: Spinal degenerative changes. 40% loss of height at the L5 level in this osteopenic patient. No priors for comparison of this location since September of 2020. No surrounding stranding. Mild retropulsion of posterior cortical elements without significant canal narrowing. IMPRESSION: Worsening of disease in the chest with enlarging mediastinal adenopathy and pulmonary metastatic disease. Mixed attenuation and ground-glass nodules without change. No evidence of metastatic disease involving the abdomen or pelvis. Enlarging RIGHT-sided pleural effusion now moderate. Abundant stool in the colon, query constipation. L5 compression fracture since available imaging. Correlate with any pain in this location. Findings are age indeterminate and associated with the proximally 40% loss of height. Bilateral nephrolithiasis and vascular calcifications. Aortic Atherosclerosis (ICD10-I70.0). Electronically Signed   By: Zetta Bills M.D.   On: 03/02/2021 11:25      ASSESSMENT & PLAN: Tiffany Bailey presents for follow-up after recent hospitalization for generalized weakness and dehydration. 1. Weakness   2. Dehydration   3. Metastatic breast cancer ([place])    Metastatic breast cancer ER positive PR negative HER2/neu negative-noted to have disease progression predominantly in her lungs/thoracic lymphadenopathy.  Currently not on treatment.  She was started on Verzenio with poor tolerance and declining performance status.  Acute kidney failure-labs  from today show creatinine 1.88, BUN 27 GFR 25.  Attempted to gain IV access but was unsuccessful.  Patient would like to try to drink fluids at home.  She is not interested in returning for additional IV fluids at this time.  Transaminitis-unclear etiology but improvement noted on today's lab work.  Will monitor at this time.  Margurite Auerbach thinks she is improved some.  Secondary to dehydration and poor appetite.  We spoke at length about drinking enough fluids.  States she will try.  Uncontrolled diabetes-followed by PCP.  Recently started on Jardiance which has now been discontinued secondary to worsening CKD.   Patient has not interested in returning for additional IV fluids.  She states she would just like to be comfortable.  I will get her scheduled for a visit with Billey Chang, palliative care nurse practitioner.  She was in agreement.  Disposition-palliative care visit ASAP.  She does not have any scheduled follow-up with medical oncology at this time.  I spent 25 minutes dedicated to the care of this patient (face-to-face and non-face-to-face) on the date of the encounter to include what is described in the assessment and plan.  Faythe Casa, NP 04/03/2021 11:56 AM

## 2021-04-04 ENCOUNTER — Ambulatory Visit (INDEPENDENT_AMBULATORY_CARE_PROVIDER_SITE_OTHER): Payer: Medicare Other | Admitting: Medical

## 2021-04-04 ENCOUNTER — Other Ambulatory Visit: Payer: Self-pay | Admitting: Family

## 2021-04-04 ENCOUNTER — Encounter: Payer: Self-pay | Admitting: Medical

## 2021-04-04 ENCOUNTER — Inpatient Hospital Stay: Payer: Medicare Other | Admitting: Hospice and Palliative Medicine

## 2021-04-04 VITALS — BP 146/60 | HR 82 | Ht 60.0 in | Wt 88.0 lb

## 2021-04-04 DIAGNOSIS — I10 Essential (primary) hypertension: Secondary | ICD-10-CM | POA: Diagnosis not present

## 2021-04-04 DIAGNOSIS — E1122 Type 2 diabetes mellitus with diabetic chronic kidney disease: Secondary | ICD-10-CM

## 2021-04-04 DIAGNOSIS — I25118 Atherosclerotic heart disease of native coronary artery with other forms of angina pectoris: Secondary | ICD-10-CM | POA: Diagnosis not present

## 2021-04-04 DIAGNOSIS — I739 Peripheral vascular disease, unspecified: Secondary | ICD-10-CM | POA: Diagnosis not present

## 2021-04-04 DIAGNOSIS — E782 Mixed hyperlipidemia: Secondary | ICD-10-CM

## 2021-04-04 DIAGNOSIS — N183 Chronic kidney disease, stage 3 unspecified: Secondary | ICD-10-CM

## 2021-04-04 DIAGNOSIS — E1159 Type 2 diabetes mellitus with other circulatory complications: Secondary | ICD-10-CM | POA: Diagnosis not present

## 2021-04-04 DIAGNOSIS — Z794 Long term (current) use of insulin: Secondary | ICD-10-CM

## 2021-04-04 DIAGNOSIS — E785 Hyperlipidemia, unspecified: Secondary | ICD-10-CM

## 2021-04-04 NOTE — Telephone Encounter (Signed)
Transition Care Management Unsuccessful Follow-up Telephone Call  Date of discharge and from where:  03/31/21-[facility]  Attempts:  2nd Attempt  Reason for unsuccessful TCM follow-up call:  Unable to reach patient

## 2021-04-04 NOTE — Patient Instructions (Signed)
Medication Instructions:  No changes at this time.  *If you need a refill on your cardiac medications before your next appointment, please call your pharmacy*   Lab Work: None  If you have labs (blood work) drawn today and your tests are completely normal, you will receive your results only by: [place] (if you have MyChart) OR A paper copy in the mail If you have any lab test that is abnormal or we need to change your treatment, we will call you to review the results.   Testing/Procedures: None   Follow-Up: At [facility], you and your health needs are our priority.  As part of our continuing mission to provide you with exceptional heart care, we have created designated Provider Care Teams.  These Care Teams include your primary Cardiologist (physician) and Advanced Practice Providers (APPs -  Physician Assistants and Nurse Practitioners) who all work together to provide you with the care you need, when you need it.  Your next appointment:   3 month(s)  The format for your next appointment:   In Person  Provider:   You may see Dr. Esmond Plants or one of the following Advanced Practice Providers on your designated Care Team:   Murray Hodgkins, NP Christell Faith, PA-C Marrianne Mood, PA-C Cadence [place], [place]

## 2021-04-05 DIAGNOSIS — R636 Underweight: Secondary | ICD-10-CM | POA: Diagnosis not present

## 2021-04-05 DIAGNOSIS — E119 Type 2 diabetes mellitus without complications: Secondary | ICD-10-CM | POA: Diagnosis not present

## 2021-04-05 NOTE — Telephone Encounter (Signed)
Transition Care Management Follow-up Telephone Call Date of discharge and from where: 03/31/21-[facility] How have you been since you were released from the hospital? Information received from daughter [name] (HIPAA compliant). Patient is doing better, getting stronger everyday; fatigue improving No new or worsening symptoms. Appetite has increased. Eating better.  Any questions or concerns? No  Items Reviewed: Did the pt receive and understand the discharge instructions provided? Yes  Medications obtained and verified? Yes  Other? No  Any new allergies since your discharge? No  Dietary orders reviewed? Yes Do you have support at home? Yes   Home Care and Equipment/Supplies: Home health services? [place] services currently not in progress.  Functional Questionnaire: (I = Independent and D = Dependent) ADLs: Family assist as needed  Eating- I  Maintaining continence- I  Transferring/Ambulation- Wheelchair  Managing Meds- Family assist as needed  Follow up appointments reviewed:  PCP Hospital f/u appt confirmed?  States will call back to secure either virtual or in office visit. Date offered 11/1. Declined scheduling for now until call back to office. 11/1 at 9:30 tentatively held. Are transportation arrangements needed? No  If their condition worsens, is the pt aware to call PCP or go to the Emergency Dept.? Yes Was the patient provided with contact information for the PCP's office or ED? Yes Was to pt encouraged to call back with questions or concerns? Yes

## 2021-04-06 ENCOUNTER — Telehealth: Payer: Medicare Other | Admitting: Hospice and Palliative Medicine

## 2021-04-09 NOTE — Telephone Encounter (Signed)
noted 

## 2021-04-10 ENCOUNTER — Other Ambulatory Visit (HOSPITAL_COMMUNITY): Payer: Self-pay

## 2021-04-10 ENCOUNTER — Telehealth: Payer: Self-pay | Admitting: Oncology

## 2021-04-10 ENCOUNTER — Inpatient Hospital Stay (HOSPITAL_BASED_OUTPATIENT_CLINIC_OR_DEPARTMENT_OTHER): Payer: Medicare Other | Admitting: Hospice and Palliative Medicine

## 2021-04-10 ENCOUNTER — Ambulatory Visit: Payer: Medicare Other | Admitting: Oncology

## 2021-04-10 ENCOUNTER — Other Ambulatory Visit: Payer: Medicare Other

## 2021-04-10 ENCOUNTER — Ambulatory Visit: Payer: Medicare Other

## 2021-04-10 DIAGNOSIS — H34812 Central retinal vein occlusion, left eye, with macular edema: Secondary | ICD-10-CM | POA: Diagnosis not present

## 2021-04-10 DIAGNOSIS — Z515 Encounter for palliative care: Secondary | ICD-10-CM

## 2021-04-10 DIAGNOSIS — C50919 Malignant neoplasm of unspecified site of unspecified female breast: Secondary | ICD-10-CM

## 2021-04-10 NOTE — Telephone Encounter (Signed)
Appointments moved to 945/03/1029, family notified.  Family is requesting that labs needed by Dr. Juleen China be drawn when she is here.  Rn notified.

## 2021-04-10 NOTE — Telephone Encounter (Signed)
Contacted Dr. Marcy Salvo office and spoke to his nurse Elita Quick, who states that patient needs Renal function panel, Intact PTH, CBC w/ diff, protein/ createnine ration, Urinalysis. Waiting on MD approval to add labs   ICD code: N18.32, D63.1

## 2021-04-10 NOTE — Telephone Encounter (Signed)
[place],  will you reach out to pt. Dr. Tasia Catchings has 2 opening tomorrow around 10, maybe she is willing to come tomorrow. As long as there is room in fluid clinic too.

## 2021-04-10 NOTE — Progress Notes (Signed)
Virtual Visit via Telephone Note  I connected with Tiffany Bailey on 04/10/21 at  1:00 PM EDT by telephone and verified that I am speaking with the correct person using two identifiers.  Location: Patient: Car Provider: Clinic   I discussed the limitations, risks, security and privacy concerns of performing an evaluation and management service by telephone and the availability of in person appointments. I also discussed with the patient that there may be a patient responsible charge related to this service. The patient expressed understanding and agreed to proceed.   History of Present Illness: Tiffany Bailey is a 87 y.o. female with multiple medical problems including COPD, PVD status post history of left lower extremity amputation, CKD, CAD, sickle cell disease, diabetes, hypertension, and stage IV breast cancer.  CT of the chest, abdomen, and pelvis on 03/01/2021 revealed worsening adenopathy in the chest and enlarging right-sided pleural effusion with multiple pulmonary nodules concerning for disease progression.  Patient was rotated from Arimidex to fulvestrant plus abemaciclib but has had poor tolerance of treatment.  Patient was referred to palliative care to address goals.   Observations/Objective: Patient was hospitalized 03/30/2021-03/31/2021 with AKI due to dehydration.  Patient saw Rulon Abide, NP on 04/03/2021 and verbalized a desire to focus on comfort and avoid clinic visits.  I called and spoke with patient and daughter today to address goals.  Daughter reports the patient is doing much better with eating and drinking.  They deny any symptomatic complaints or concerns today.  We addressed general goals including option of foregoing future work-up and treatment.  However, daughter says that they are interested in continuing cancer treatment.  Patient is scheduled to see Dr. Tasia Catchings on Thursday of this week.  Assessment and Plan: Stage IV breast cancer -follow-up visit scheduled with Dr.  Tasia Catchings on 10/27.  Will follow  Follow Up Instructions: RTC later this week   I discussed the assessment and treatment plan with the patient. The patient was provided an opportunity to ask questions and all were answered. The patient agreed with the plan and demonstrated an understanding of the instructions.   The patient was advised to call back or seek an in-person evaluation if the symptoms worsen or if the condition fails to improve as anticipated.  I provided 5 minutes of non-face-to-face time during this encounter.   Irean Hong, NP

## 2021-04-11 ENCOUNTER — Other Ambulatory Visit: Payer: Self-pay

## 2021-04-11 DIAGNOSIS — N182 Chronic kidney disease, stage 2 (mild): Secondary | ICD-10-CM

## 2021-04-11 NOTE — Telephone Encounter (Signed)
MD ok with adding labs. Pt's daughter notified.

## 2021-04-12 ENCOUNTER — Encounter: Payer: Self-pay | Admitting: Oncology

## 2021-04-12 ENCOUNTER — Inpatient Hospital Stay: Payer: Medicare Other

## 2021-04-12 ENCOUNTER — Inpatient Hospital Stay (HOSPITAL_BASED_OUTPATIENT_CLINIC_OR_DEPARTMENT_OTHER): Payer: Medicare Other | Admitting: Hospice and Palliative Medicine

## 2021-04-12 ENCOUNTER — Inpatient Hospital Stay: Payer: Medicare Other | Admitting: Oncology

## 2021-04-12 ENCOUNTER — Other Ambulatory Visit (HOSPITAL_COMMUNITY): Payer: Self-pay

## 2021-04-12 ENCOUNTER — Inpatient Hospital Stay (HOSPITAL_BASED_OUTPATIENT_CLINIC_OR_DEPARTMENT_OTHER): Payer: Medicare Other | Admitting: Oncology

## 2021-04-12 ENCOUNTER — Other Ambulatory Visit: Payer: Self-pay

## 2021-04-12 VITALS — BP 171/70 | HR 109 | Temp 96.9°F | Resp 18 | Wt 85.6 lb

## 2021-04-12 DIAGNOSIS — N182 Chronic kidney disease, stage 2 (mild): Secondary | ICD-10-CM

## 2021-04-12 DIAGNOSIS — M8580 Other specified disorders of bone density and structure, unspecified site: Secondary | ICD-10-CM | POA: Diagnosis not present

## 2021-04-12 DIAGNOSIS — Z803 Family history of malignant neoplasm of breast: Secondary | ICD-10-CM | POA: Diagnosis not present

## 2021-04-12 DIAGNOSIS — Z79811 Long term (current) use of aromatase inhibitors: Secondary | ICD-10-CM | POA: Diagnosis not present

## 2021-04-12 DIAGNOSIS — N1831 Chronic kidney disease, stage 3a: Secondary | ICD-10-CM | POA: Diagnosis not present

## 2021-04-12 DIAGNOSIS — Z89612 Acquired absence of left leg above knee: Secondary | ICD-10-CM | POA: Diagnosis not present

## 2021-04-12 DIAGNOSIS — Z7189 Other specified counseling: Secondary | ICD-10-CM

## 2021-04-12 DIAGNOSIS — I252 Old myocardial infarction: Secondary | ICD-10-CM | POA: Diagnosis not present

## 2021-04-12 DIAGNOSIS — I129 Hypertensive chronic kidney disease with stage 1 through stage 4 chronic kidney disease, or unspecified chronic kidney disease: Secondary | ICD-10-CM | POA: Diagnosis not present

## 2021-04-12 DIAGNOSIS — Z5111 Encounter for antineoplastic chemotherapy: Secondary | ICD-10-CM | POA: Insufficient documentation

## 2021-04-12 DIAGNOSIS — C50919 Malignant neoplasm of unspecified site of unspecified female breast: Secondary | ICD-10-CM | POA: Diagnosis not present

## 2021-04-12 DIAGNOSIS — R531 Weakness: Secondary | ICD-10-CM

## 2021-04-12 DIAGNOSIS — R7989 Other specified abnormal findings of blood chemistry: Secondary | ICD-10-CM

## 2021-04-12 DIAGNOSIS — Z515 Encounter for palliative care: Secondary | ICD-10-CM

## 2021-04-12 DIAGNOSIS — D631 Anemia in chronic kidney disease: Secondary | ICD-10-CM | POA: Diagnosis not present

## 2021-04-12 DIAGNOSIS — E1122 Type 2 diabetes mellitus with diabetic chronic kidney disease: Secondary | ICD-10-CM | POA: Diagnosis not present

## 2021-04-12 LAB — URINALYSIS, COMPLETE (UACMP) WITH MICROSCOPIC
Bacteria, UA: NONE SEEN
Bilirubin Urine: NEGATIVE
Glucose, UA: 50 mg/dL — AB
Hgb urine dipstick: NEGATIVE
Ketones, ur: NEGATIVE mg/dL
Leukocytes,Ua: NEGATIVE
Nitrite: NEGATIVE
Protein, ur: NEGATIVE mg/dL
Specific Gravity, Urine: 1.005 (ref 1.005–1.030)
Squamous Epithelial / HPF: NONE SEEN (ref 0–5)
pH: 6 (ref 5.0–8.0)

## 2021-04-12 LAB — COMPREHENSIVE METABOLIC PANEL
ALT: 41 U/L (ref 0–44)
AST: 39 U/L (ref 15–41)
Albumin: 3.3 g/dL — ABNORMAL LOW (ref 3.5–5.0)
Alkaline Phosphatase: 82 U/L (ref 38–126)
Anion gap: 8 (ref 5–15)
BUN: 13 mg/dL (ref 8–23)
CO2: 24 mmol/L (ref 22–32)
Calcium: 8.7 mg/dL — ABNORMAL LOW (ref 8.9–10.3)
Chloride: 103 mmol/L (ref 98–111)
Creatinine, Ser: 1.19 mg/dL — ABNORMAL HIGH (ref 0.44–1.00)
GFR, Estimated: 43 mL/min — ABNORMAL LOW (ref >60)
Glucose, Bld: 109 mg/dL — ABNORMAL HIGH (ref 70–99)
Potassium: 3.2 mmol/L — ABNORMAL LOW (ref 3.5–5.1)
Sodium: 135 mmol/L (ref 135–145)
Total Bilirubin: 0.8 mg/dL (ref 0.3–1.2)
Total Protein: 6.5 g/dL (ref 6.5–8.1)

## 2021-04-12 LAB — CBC WITH DIFFERENTIAL/PLATELET
Abs Immature Granulocytes: 0.03 10*3/uL (ref 0.00–0.07)
Basophils Absolute: 0.1 10*3/uL (ref 0.0–0.1)
Basophils Relative: 2 %
Eosinophils Absolute: 0.1 10*3/uL (ref 0.0–0.5)
Eosinophils Relative: 2 %
HCT: 30.1 % — ABNORMAL LOW (ref 36.0–46.0)
Hemoglobin: 9.9 g/dL — ABNORMAL LOW (ref 12.0–15.0)
Immature Granulocytes: 1 %
Lymphocytes Relative: 33 %
Lymphs Abs: 1.4 10*3/uL (ref 0.7–4.0)
MCH: 30.2 pg (ref 26.0–34.0)
MCHC: 32.9 g/dL (ref 30.0–36.0)
MCV: 91.8 fL (ref 80.0–100.0)
Monocytes Absolute: 0.5 10*3/uL (ref 0.1–1.0)
Monocytes Relative: 12 %
Neutro Abs: 2.1 10*3/uL (ref 1.7–7.7)
Neutrophils Relative %: 50 %
Platelets: 322 10*3/uL (ref 150–400)
RBC: 3.28 MIL/uL — ABNORMAL LOW (ref 3.87–5.11)
RDW: 16 % — ABNORMAL HIGH (ref 11.5–15.5)
WBC: 4.1 10*3/uL (ref 4.0–10.5)
nRBC: 0 % (ref 0.0–0.2)

## 2021-04-12 LAB — RENAL FUNCTION PANEL
Albumin: 3.3 g/dL — ABNORMAL LOW (ref 3.5–5.0)
Anion gap: 10 (ref 5–15)
BUN: 13 mg/dL (ref 8–23)
CO2: 24 mmol/L (ref 22–32)
Calcium: 9 mg/dL (ref 8.9–10.3)
Chloride: 104 mmol/L (ref 98–111)
Creatinine, Ser: 1.2 mg/dL — ABNORMAL HIGH (ref 0.44–1.00)
GFR, Estimated: 43 mL/min — ABNORMAL LOW (ref >60)
Glucose, Bld: 114 mg/dL — ABNORMAL HIGH (ref 70–99)
Phosphorus: 2.7 mg/dL (ref 2.5–4.6)
Potassium: 3.4 mmol/L — ABNORMAL LOW (ref 3.5–5.1)
Sodium: 138 mmol/L (ref 135–145)

## 2021-04-12 NOTE — Progress Notes (Signed)
[facility] [place] [facility]  [phone] [phone]   Name: Tiffany Bailey Date: 04/12/2021 MRN: [id number]  DOB: [birth year]  Patient Care Team: Jason Coop, NP as Nurse Practitioner (Hospice and Palliative Medicine) [name], MD as Consulting Physician (Oncology) De Hollingshead, RPH-CPP (Pharmacist) Minna Merritts, MD as Consulting Physician (Cardiology) Lauretta Grill, NP as Nurse Practitioner (Nurse Practitioner)    REASON FOR CONSULTATION: Tiffany Bailey is a 87 y.o. female with multiple medical problems including COPD, PVD status post history of left lower extremity amputation, CKD, CAD, sickle cell disease, diabetes, hypertension, and stage IV breast cancer.  CT of the chest, abdomen, and pelvis on 03/01/2021 revealed worsening adenopathy in the chest and enlarging right-sided pleural effusion with multiple pulmonary nodules concerning for disease progression.  Patient was rotated from Arimidex to fulvestrant plus abemaciclib but has had poor tolerance of treatment.  Patient was referred to palliative care to address goals.  SOCIAL HISTORY:     reports that she has never smoked. She has never used smokeless tobacco. She reports that she does not drink alcohol and does not use drugs.  Patient is widowed.  She lives at home with her son.  She has 4 sons and 3 daughters.  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  ADVANCE DIRECTIVES:  None on file  CODE STATUS: Full code per MOST form  PAST MEDICAL HISTORY: Past Medical History:  Diagnosis Date   Anemia    Anginal pain ([place])    Arthritis    Breast cancer ([place]) 07/19/2019   CKD (chronic kidney disease), stage III (H[facility])    Coronary artery disease    a. 2002 CABG x 3 (LIMA-LAD, SVG-D1, SVG-RCA); b. s/p multiple PCI's.   Diabetes mellitus without complication ([place])    Diastolic dysfunction    a. 12/2017 Echo: EF 55-60%, no  rwma, Gr1 DD. Triv AI. Mild-mod MR/TR. PASP 35mHg.   Edema    Failure to thrive in adult    GERD (gastroesophageal reflux disease)    Hb-SS disease without crisis ([facility] 10/23/2017   History of breast cancer    HOH (hard of hearing)    Hyperlipidemia    Hypertension    Myocardial infarction ([facility]    PAD [place]peripheral artery disease) [place]    a. s/p L AKA; b. 12/2018 PTA R peroneal & PTA/Stenting R SFA.   Palpitations     PAST SURGICAL HISTORY:  Past Surgical History:  Procedure Laterality Date   ABDOMINAL HYSTERECTOMY     ABOVE KNEE LEG AMPUTATION Left 2013   ANTERIOR VITRECTOMY Left 11/16/2015   Procedure: ANTERIOR VITRECTOMY;  Surgeon: BEulogio Bear MD;  Location: [facility] ORS;  Service: Ophthalmology;  Laterality: Left;   BLADDER SURGERY     CATARACT EXTRACTION W/PHACO Left 11/16/2015   Procedure: CATARACT EXTRACTION PHACO AND INTRAOCULAR LENS PLACEMENT (IOC);  Surgeon: BEulogio Bear MD;  Location: [facility] ORS;  Service: Ophthalmology;  Laterality: Left;  Lot # [id number] [place] 01:22.8 AP%:12.6 CDE: 10.46   CATARACT EXTRACTION W/PHACO Right 08/22/2016   Procedure: CATARACT EXTRACTION PHACO AND INTRAOCULAR LENS PLACEMENT (IOC);  Surgeon: BEulogio Bear MD;  Location: [facility] ORS;  Service: Ophthalmology;  Laterality: Right;  Lot # [id number] [place] 00:52.1 AP%:8.7 CDE: 4.99    CORONARY ANGIOPLASTY     STENT   CORONARY ARTERY BYPASS GRAFT     LOWER EXTREMITY ANGIOGRAPHY Right 01/14/2019   Procedure: LOWER EXTREMITY ANGIOGRAPHY;  Surgeon: Algernon Huxley, MD;  Location: [facility];  Service: Cardiovascular;  Laterality: Right;   MASTECTOMY     TUMOR REMOVAL     ABDOMINAL    HEMATOLOGY/ONCOLOGY HISTORY:  Oncology History  Breast cancer ([place])  07/19/2019 Initial Diagnosis   Breast cancer ([place])   07/26/2019 - 07/26/2019 Chemotherapy   The patient had anastrozole (ARIMIDEX) 1 MG tablet, 1 mg, Oral, Daily, 0 of 1 cycle, Start date: --, End date: -- abemaciclib (VERZENIO) 150 MG  tablet, 150 mg, Oral, 2 times daily, 0 of 1 cycle, Start date: --, End date: --  for chemotherapy treatment.      ALLERGIES:  is allergic to amoxicillin, gabapentin, mirtazapine, and other.  MEDICATIONS:  Current Outpatient Medications  Medication Sig Dispense Refill   abemaciclib (VERZENIO) 150 MG tablet Take 1 tablet (150 mg total) by mouth 2 (two) times daily. Swallow tablets whole. Do not chew, crush, or split tablets before swallowing. (Patient not taking: Reported on 04/12/2021) 56 tablet 0   amLODipine (NORVASC) 10 MG tablet TAKE 1 TABLET BY MOUTH  DAILY 30 tablet 0   aspirin EC 81 MG tablet Take 1 tablet (81 mg total) by mouth daily. 30 tablet 5   atorvastatin (LIPITOR) 40 MG tablet TAKE 1 TABLET BY MOUTH  DAILY 90 tablet 3   blood glucose meter kit and supplies KIT Dispense based on patient and insurance preference. Use up to four times daily as directed. (FOR ICD-9 250.00, 250.01). 1 each 0   clopidogrel (PLAVIX) 75 MG tablet TAKE 1 TABLET BY MOUTH  DAILY 30 tablet 0   feeding supplement (ENSURE ENLIVE / ENSURE PLUS) LIQD Take 237 mLs by mouth 2 (two) times daily between meals.     ferrous gluconate (FERGON) 324 MG tablet Take 324 mg by mouth 2 (two) times daily.     glucose blood (ONE TOUCH TEST STRIPS) test strip Use to test blood sugar twice daily 200 each 3   hydrALAZINE (APRESOLINE) 10 MG tablet Take 1 tablet (10 mg total) by mouth in the morning and at bedtime. 180 tablet 3   insulin degludec (TRESIBA FLEXTOUCH) 100 UNIT/ML FlexTouch Pen Hold until followup with your outpatient doctor, since you are not eating well, and your blood sugars were low in the hospital even without insulin. (Patient taking differently: 6 Units. Hold until followup with your outpatient doctor, since you are not eating well, and your blood sugars were low in the hospital even without insulin.) 6 mL 1   Insulin Pen Needle (BD PEN NEEDLE NANO U/F) 32G X 4 MM MISC USE 1 DAILY 100 each 12   Metoprolol  Tartrate 37.5 MG TABS Take 1 tablet by mouth 2 (two) times daily.     nitroGLYCERIN (NITROSTAT) 0.4 MG SL tablet Place 1 tablet (0.4 mg total) under the tongue every 5 (five) minutes as needed for chest pain. 25 tablet 4   ONE TOUCH LANCETS MISC Use as directed 2 times per day 200 each 3   vitamin C (ASCORBIC ACID) 250 MG tablet Take 250 mg by mouth daily.     No current facility-administered medications for this visit.    VITAL SIGNS: There were no vitals taken for this visit. There were no vitals filed for this visit.  Estimated body mass index is 16.72 kg/m as calculated from the following:   Height as of 04/04/21: 5' (1.524 m).   Weight as of an earlier encounter on 04/12/21: 85 lb 9.6 oz (38.8 kg).  LABS: CBC:    Component Value Date/Time   WBC 4.1 04/12/2021 0953   HGB 9.9 (L) 04/12/2021 0953   HCT 30.1 (L) 04/12/2021 0953   PLT 322 04/12/2021 0953   MCV 91.8 04/12/2021 0953   NEUTROABS 2.1 04/12/2021 0953   LYMPHSABS 1.4 04/12/2021 0953   MONOABS 0.5 04/12/2021 0953   EOSABS 0.1 04/12/2021 0953   BASOSABS 0.1 04/12/2021 0953   Comprehensive Metabolic Panel:    Component Value Date/Time   NA 138 04/12/2021 0953   NA 135 04/12/2021 0953   K 3.4 (L) 04/12/2021 0953   K 3.2 (L) 04/12/2021 0953   CL 104 04/12/2021 0953   CL 103 04/12/2021 0953   CO2 24 04/12/2021 0953   CO2 24 04/12/2021 0953   BUN 13 04/12/2021 0953   BUN 13 04/12/2021 0953   CREATININE 1.20 (H) 04/12/2021 0953   CREATININE 1.19 (H) 04/12/2021 0953   CREATININE 1.18 (H) 06/19/2018 1006   GLUCOSE 114 (H) 04/12/2021 0953   GLUCOSE 109 (H) 04/12/2021 0953   CALCIUM 9.0 04/12/2021 0953   CALCIUM 8.7 (L) 04/12/2021 0953   AST 39 04/12/2021 0953   ALT 41 04/12/2021 0953   ALKPHOS 82 04/12/2021 0953   BILITOT 0.8 04/12/2021 0953   PROT 6.5 04/12/2021 0953   ALBUMIN 3.3 (L) 04/12/2021 0953   ALBUMIN 3.3 (L) 04/12/2021 0953    RADIOGRAPHIC STUDIES: DG Chest 2 View  Result Date:  03/30/2021 CLINICAL DATA:  Weakness. EXAM: CHEST - 2 VIEW COMPARISON:  Chest x-ray 02/13/2021, CT chest 03/21/2020 chest x-ray 10/17/2017, CT chest 03/01/2021 FINDINGS: The heart and mediastinal contours are unchanged. Aortic calcification. Coronary artery stents. Hyperinflation of the lungs. Slightly less conspicuous patchy nodular like densities. No pulmonary edema. Blunting of bilateral costophrenic angles with trace bilateral pleural effusions not excluded. No pneumothorax. No acute osseous abnormality. IMPRESSION: Slightly less conspicuous patchy nodular like densities. Electronically Signed   By: Iven Finn M.D.   On: 03/30/2021 17:35    PERFORMANCE STATUS (ECOG) : 3 - Symptomatic, >50% confined to bed  Review of Systems Unless otherwise noted, a complete review of systems is negative.  Physical Exam General: NAD Pulmonary: Unlabored Extremities: Left lower extremity amputation Skin: no rashes Neurological: Weakness but otherwise nonfocal  IMPRESSION: I met with patient and her son following their visit with Dr. Tasia Catchings.  Patient is accompanied by her son.   Patient reports she is doing well following her recent hospitalization.  She says her goals are aligned with continued cancer treatment and is willing to pursue any treatments offered.   Symptomatically, she endorses GERD. Recommended trial of omeprazole OTC.   Patient is actively being followed by community palliative care. Will refer for nutrition and rehab screening.   PLAN: -Continue current scope of treatment -Recommend DNR/DNI -Referrals to rehab screening and nutrition -Follow-up MyChart visit 1 month  Case and plan discussed with Dr. Tasia Catchings  Patient expressed understanding and was in agreement with this plan. She also understands that She can call the clinic at any time with any questions, concerns, or complaints.     Time Total: 15 minutes  Visit consisted of counseling and education dealing with the complex and  emotionally intense issues of symptom management and palliative care in the setting of serious and potentially life-threatening illness.Greater than 50%  of this time was spent counseling and coordinating care related to the above assessment and plan.  Signed by: Altha Harm, PhD, NP-C

## 2021-04-12 NOTE — Progress Notes (Signed)
Hematology/Oncology  Follow up note [facility] Telephone:(336) [phone] Fax:([phone]) [phone]   Patient Care Team: Jason Coop, NP as Nurse Practitioner (Hospice and Palliative Medicine) [name], MD as Consulting Physician (Oncology) De Hollingshead, RPH-CPP (Pharmacist) Minna Merritts, MD as Consulting Physician (Cardiology) Lauretta Grill, NP as Nurse Practitioner (Nurse Practitioner)  REFERRING PROVIDER: [name], FNP  CHIEF COMPLAINTS/REASON FOR VISIT:  Follow up for metastatic breast cancer  HISTORY OF PRESENTING ILLNESS:   Tiffany Bailey is a  87 y.o.  female with PMH listed below was seen in consultation at the request of  [name], FNP  for evaluation of abnormal CT scan. Patient was recently evaluated by primary care provider for complaints of urinary retention, headache, constipation. 02/03/2019 x-ray of abdomen showed large colonic stool volume, nodular densities noted in the right mid lung are up). 02/08/2019 subsequent chest x-ray two-view showed hyperinflation of the lungs compatible with COPD.  Multiple nodular densities project over the mid and lower right lung. CT chest was obtained for further evaluation. 02/17/2019 CT chest without contrast Showed multiple bilateral pulmonary nodules,groundglass opacities. Largest solid nodule at the confluence of the right major and minor fissures measuring 1.6 cm.  Most notable groundglass nodule is of the right upper lobe and measure 1.8 x 0.8 cm, small right pleural effusion with associated atelectasis or consolidation.  Larger solid nodule are highly suspicious for metastatic disease. Bulky right mediastinal and hilar lymph node.  Largest pretracheal nodes measuring 3.5 x 3 cm.  There are additional bulky superior mediastinal, supraclavicular, left subpectoral lymph nodes.  Largest subpectoral nodes measuring 3.3 x 2.5 cm.  Patient was sent to cancer center for further  evaluation.  #Patient reports a history of left breast cancer status post mastectomy.  She is a poor historian.  She is not able to recall the timeframe of breast cancer diagnosis and then treatments.  Denies any chemotherapy or radiation treatments.  #Left lower extremity history of amputation, CKD, History of MI, Sickle cell disease, DM, HTN, anemia.   Patient denies shortness of breath, chest pain, hemoptysis.  Night sweating, abdominal pain. Reports intentional weight loss.  Appetite is poor.  Not eating much. Also have wax and wane, chronic intermittent headache, she takes Tylenol as needed, with some relief.  Denies any nausea vomiting. She sees neurology. 11/10/2018 CT head showed no acute intracranial abnormality or significant interval changes.  Stable atrophy and white matter disease.  Atherosclerosis.  #Patient had a consultation visit with me on 02/19/2019.  At that time he was referred for abnormal CT scan.  PET scan was obtained and I recommend patient to obtain left axillary lymph node biopsy.  Patient/family called back and decided not to proceed with any additional work-up.  #February 2020 started Arimidex  INTERVAL HISTORY Tiffany Bailey is a 87 y.o. female who has above history reviewed by me today presents for follow up visit for management of metastatic breast cancer. Patient was accompanied by her son [name].   03/30/2021 - 03/31/2021 Admission due to AKI, felt to be due to dehydration, side effects of Jardiance.  She also was seen by NP after discharge and received IVF.  Today she feels better, fatigue is better, appetite has improved.  BP and HR are high today, she ran out of her metoprolol and plans to pick up refills and restart today.  No nausea, vomiting, diarrhea.   Review of Systems  Constitutional:  Positive for fatigue and unexpected weight change.  Negative for appetite change, chills and fever.  HENT:   Negative for hearing loss and voice change.   Eyes:   Negative for eye problems.  Respiratory:  Negative for chest tightness, cough and shortness of breath.   Cardiovascular:  Negative for chest pain.  Gastrointestinal:  Negative for abdominal distention, abdominal pain, blood in stool and constipation.  Endocrine: Negative for hot flashes.  Genitourinary:  Negative for difficulty urinating and frequency.   Musculoskeletal:  Negative for arthralgias.       Left lower extremity history of amputation, intermittent sharp pain  Skin:  Negative for itching and rash.  Neurological:  Negative for extremity weakness and headaches.  Hematological:  Negative for adenopathy.  Psychiatric/Behavioral:  Negative for confusion.        Sleeps a lot during the day.   MEDICAL HISTORY:  Past Medical History:  Diagnosis Date   Anemia    Anginal pain ([place])    Arthritis    Breast cancer ([place]) 07/19/2019   CKD (chronic kidney disease), stage III ([place])    Coronary artery disease    a. 2002 CABG x 3 (LIMA-LAD, SVG-D1, SVG-RCA); b. s/p multiple PCI's.   Diabetes mellitus without complication ([place])    Diastolic dysfunction    a. 12/2017 Echo: EF 55-60%, no rwma, Gr1 DD. Triv AI. Mild-mod MR/TR. PASP 64mHg.   Edema    Failure to thrive in adult    GERD (gastroesophageal reflux disease)    Hb-SS disease without crisis ([place] 10/23/2017   History of breast cancer    HOH (hard of hearing)    Hyperlipidemia    Hypertension    Myocardial infarction ([facility]    PAD (peripheral artery disease) ([place]    a. s/p L AKA; b. 12/2018 PTA R peroneal & PTA/Stenting R SFA.   Palpitations     SURGICAL HISTORY: Past Surgical History:  Procedure Laterality Date   ABDOMINAL HYSTERECTOMY     ABOVE KNEE LEG AMPUTATION Left 2013   ANTERIOR VITRECTOMY Left 11/16/2015   Procedure: ANTERIOR VITRECTOMY;  Surgeon: BEulogio Bear MD;  Location: [facility] ORS;  Service: Ophthalmology;  Laterality: Left;   BLADDER SURGERY     CATARACT EXTRACTION W/PHACO Left 11/16/2015   Procedure:  CATARACT EXTRACTION PHACO AND INTRAOCULAR LENS PLACEMENT (IOC);  Surgeon: BEulogio Bear MD;  Location: [facility] ORS;  Service: Ophthalmology;  Laterality: Left;  Lot # [id number] [place] 01:22.8 AP%:12.6 CDE: 10.46   CATARACT EXTRACTION W/PHACO Right 08/22/2016   Procedure: CATARACT EXTRACTION PHACO AND INTRAOCULAR LENS PLACEMENT (IOC);  Surgeon: BEulogio Bear MD;  Location: [facility] ORS;  Service: Ophthalmology;  Laterality: Right;  Lot # [id number] [place] 00:52.1 AP%:8.7 CDE: 4.99    CORONARY ANGIOPLASTY     STENT   CORONARY ARTERY BYPASS GRAFT     LOWER EXTREMITY ANGIOGRAPHY Right 01/14/2019   Procedure: LOWER EXTREMITY ANGIOGRAPHY;  Surgeon: DAlgernon Huxley MD;  [facility];  Service: Cardiovascular;  Laterality: Right;   MASTECTOMY     TUMOR REMOVAL     ABDOMINAL    SOCIAL HISTORY: Social History   Socioeconomic History   Marital status: Divorced    Spouse name: Not on file   Number of children: 7   Years of education: Not on file   Highest education level: Not on file  Occupational History   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  Tobacco Use   Smoking status: Never   Smokeless tobacco: Never  Substance and Sexual Activity  Alcohol use: No   Drug use: No   Sexual activity: Not Currently  Other Topics Concern   Not on file  Social History Narrative   Not on file   Social Determinants of Health   Financial Resource Strain: Low Risk    Difficulty of Paying Living Expenses: Not hard at all  Food Insecurity: Not on file  Transportation Needs: Not on file  Physical Activity: Not on file  Stress: Not on file  Social Connections: Not on file  Intimate Partner Violence: Not on file    FAMILY HISTORY: Family History  Problem Relation Age of Onset   Sudden death Mother    Arthritis Father    Stroke Father    Breast cancer Sister    Diabetes Grandchild     ALLERGIES:  is allergic to amoxicillin, gabapentin, mirtazapine, and  other.  MEDICATIONS:  Current Outpatient Medications  Medication Sig Dispense Refill   amLODipine (NORVASC) 10 MG tablet TAKE 1 TABLET BY MOUTH  DAILY 30 tablet 0   aspirin EC 81 MG tablet Take 1 tablet (81 mg total) by mouth daily. 30 tablet 5   atorvastatin (LIPITOR) 40 MG tablet TAKE 1 TABLET BY MOUTH  DAILY 90 tablet 3   blood glucose meter kit and supplies KIT Dispense based on patient and insurance preference. Use up to four times daily as directed. (FOR ICD-9 250.00, 250.01). 1 each 0   clopidogrel (PLAVIX) 75 MG tablet TAKE 1 TABLET BY MOUTH  DAILY 30 tablet 0   feeding supplement (ENSURE ENLIVE / ENSURE PLUS) LIQD Take 237 mLs by mouth 2 (two) times daily between meals.     ferrous gluconate (FERGON) 324 MG tablet Take 324 mg by mouth 2 (two) times daily.     glucose blood (ONE TOUCH TEST STRIPS) test strip Use to test blood sugar twice daily 200 each 3   hydrALAZINE (APRESOLINE) 10 MG tablet Take 1 tablet (10 mg total) by mouth in the morning and at bedtime. 180 tablet 3   insulin degludec (TRESIBA FLEXTOUCH) 100 UNIT/ML FlexTouch Pen Hold until followup with your outpatient doctor, since you are not eating well, and your blood sugars were low in the hospital even without insulin. (Patient taking differently: 6 Units. Hold until followup with your outpatient doctor, since you are not eating well, and your blood sugars were low in the hospital even without insulin.) 6 mL 1   Insulin Pen Needle (BD PEN NEEDLE NANO U/F) 32G X 4 MM MISC USE 1 DAILY 100 each 12   Metoprolol Tartrate 37.5 MG TABS Take 1 tablet by mouth 2 (two) times daily.     nitroGLYCERIN (NITROSTAT) 0.4 MG SL tablet Place 1 tablet (0.4 mg total) under the tongue every 5 (five) minutes as needed for chest pain. 25 tablet 4   ONE TOUCH LANCETS MISC Use as directed 2 times per day 200 each 3   vitamin C (ASCORBIC ACID) 250 MG tablet Take 250 mg by mouth daily.     abemaciclib (VERZENIO) 150 MG tablet Take 1 tablet (150 mg  total) by mouth 2 (two) times daily. Swallow tablets whole. Do not chew, crush, or split tablets before swallowing. (Patient not taking: Reported on 04/12/2021) 56 tablet 0   No current facility-administered medications for this visit.     PHYSICAL EXAMINATION: ECOG PERFORMANCE STATUS: 2 - Symptomatic, <50% confined to bed Vitals:   04/12/21 1025  BP: (!) 171/70  Pulse: (!) 109  Resp: 18  Temp: (!) 96.9  F (36.1 C)   Filed Weights   04/12/21 1025  Weight: 85 lb 9.6 oz (38.8 kg)    Physical Exam Constitutional:      General: She is not in acute distress.    Comments: She sits in the wheel chair.  Frail appearance  HENT:     Head: Normocephalic and atraumatic.  Eyes:     General: No scleral icterus.    Pupils: Pupils are equal, round, and reactive to light.  Cardiovascular:     Rate and Rhythm: Regular rhythm. Tachycardia present.     Heart sounds: Normal heart sounds.  Pulmonary:     Effort: Pulmonary effort is normal. No respiratory distress.     Breath sounds: No wheezing.     Comments: Decreased breath sound right lower lobe.  Good air entry on the left base.  Abdominal:     General: Bowel sounds are normal. There is no distension.     Palpations: Abdomen is soft.  Musculoskeletal:        General: No deformity. Normal range of motion.     Cervical back: Normal range of motion and neck supple.     Comments: Left lower extremity history of amputation.   Skin:    General: Skin is warm and dry.     Findings: No erythema or rash.  Neurological:     Mental Status: She is alert and oriented to person, place, and time. Mental status is at baseline.     Cranial Nerves: No cranial nerve deficit.     Coordination: Coordination normal.  Psychiatric:        Mood and Affect: Mood normal.     LABORATORY DATA:  I have reviewed the data as listed Lab Results  Component Value Date   WBC 4.1 04/12/2021   HGB 9.9 (L) 04/12/2021   HCT 30.1 (L) 04/12/2021   MCV 91.8  04/12/2021   PLT 322 04/12/2021   Recent Labs    03/30/21 1650 03/31/21 0428 04/03/21 0955 04/12/21 0953  NA  --  137 133* 138  135  K  --  3.8 4.1 3.4*  3.2*  CL  --  115* 109 104  103  CO2  --  18* 18* 24  24  GLUCOSE  --  51* 126* 114*  109*  BUN  --  31* 27* 13  13  CREATININE  --  1.65* 1.88* 1.20*  1.19*  CALCIUM  --  8.4* 8.4* 9.0  8.7*  GFRNONAA  --  29* 25* 43*  43*  PROT 6.5 5.6* 6.4* 6.5  ALBUMIN 3.1* 2.7* 3.2* 3.3*  3.3*  AST 165* 132* 80* 39  ALT 167* 133* 100* 41  ALKPHOS 89 78 78 82  BILITOT 0.7 0.7 0.4 0.8  BILIDIR 0.1  --   --   --   IBILI 0.6  --   --   --     Iron/TIBC/Ferritin/ %Sat    Component Value Date/Time   IRON 33 01/12/2021 1133   TIBC 220 (L) 01/12/2021 1133   FERRITIN 74 01/12/2021 1133   IRONPCTSAT 15 01/12/2021 1133   IRONPCTSAT 27 06/19/2018 1006      RADIOGRAPHIC STUDIES: I have personally reviewed the radiological images as listed and agreed with the findings in the report. DG Chest 2 View  Result Date: 03/30/2021 CLINICAL DATA:  Weakness. EXAM: CHEST - 2 VIEW COMPARISON:  Chest x-ray 02/13/2021, CT chest 03/21/2020 chest x-ray 10/17/2017, CT chest 03/01/2021 FINDINGS: The heart and mediastinal  contours are unchanged. Aortic calcification. Coronary artery stents. Hyperinflation of the lungs. Slightly less conspicuous patchy nodular like densities. No pulmonary edema. Blunting of bilateral costophrenic angles with trace bilateral pleural effusions not excluded. No pneumothorax. No acute osseous abnormality. IMPRESSION: Slightly less conspicuous patchy nodular like densities. Electronically Signed   By: Iven Finn M.D.   On: 03/30/2021 17:35   DG Chest 2 View  Result Date: 02/14/2021 CLINICAL DATA:  Persistent cough EXAM: CHEST - 2 VIEW COMPARISON:  CT from 03/21/2020 FINDINGS: Cardiac shadow is within normal limits. Postsurgical changes are noted. Lungs are well aerated bilaterally. No focal infiltrate is seen. Small  right-sided pleural effusion is noted stable from the prior exam with minimal basilar atelectasis. Nodular changes are noted in the right mid lung and right lung base increased from the prior CT examination. Repeat CT is recommended for follow-up. No bony abnormality is seen. IMPRESSION: Increase in nodular changes within the right lung when compare with the prior exam likely related to increasing metastatic disease. CT of the chest with contrast is recommended for further evaluation. These results will be called to the ordering clinician or representative by the Radiologist Assistant, and communication documented in the PACS or [employer]. Electronically Signed   By: [name] M.D.   On: 02/14/2021 09:56   CT CHEST ABDOMEN PELVIS WO CONTRAST  Result Date: 03/02/2021 CLINICAL DATA:  History of metastatic breast cancer in a 87 year old female EXAM: CT CHEST, ABDOMEN AND PELVIS WITHOUT CONTRAST TECHNIQUE: Multidetector CT imaging of the chest, abdomen and pelvis was performed following the standard protocol without IV contrast. COMPARISON:  October of 2021 FINDINGS: CT CHEST FINDINGS Cardiovascular: Calcified atheromatous plaque in the thoracic aorta. No aneurysmal dilation. Three-vessel coronary artery disease, abundant three-vessel disease. No substantial pericardial effusion. Normal caliber of central pulmonary vessels. Mediastinum/Nodes: Bulky precarinal/RIGHT paratracheal adenopathy 2.7 cm (image 23/7) previously approximately 1.5 cm. Bulky subcarinal lymphadenopathy (image 26/7) 2.5 cm, previously 1.9 cm. No thoracic inlet adenopathy, axillary adenopathy or internal mammary adenopathy. Smaller lymph nodes identified in the upper mediastinum on the previous study are unchanged. Lungs/Pleura: Enlarging RIGHT-sided effusion now moderate and layering posteriorly multiple pulmonary nodules. (Image 65/8) 1.9 cm greatest axial dimension, RIGHT upper lobe posterior pulmonary nodule with mixed attenuation  measuring 1.9 cm previously. (Image 90/8) new 7 x 6 mm pulmonary nodule in the LEFT upper lobe along the pleural surface. (Image 97/8) nodule along an accessory fissure in the RIGHT lower lobe measuring 12 x 9 mm previously 9 mm greatest dimension. (Image 84/8) 7 mm perifissural nodule previously 3 mm. Nodularity along the confluence of fissures in the RIGHT mid chest now with increased thickness at 6 mm greatest thickness, previously 4 mm. This spans 1.8 cm of the fissure previously 1.6 cm. Enlarging Peri fissural nodule (image 46/8) 7 mm previously 2-3 mm. Areas of ground-glass attenuation in the chest in the LEFT chest and RIGHT upper lobe show a similar appearance. Airways are patent. Basilar volume loss on the RIGHT. Musculoskeletal: See below for full musculoskeletal details. CT ABDOMEN PELVIS FINDINGS Hepatobiliary: Normal liver on noncontrast imaging. No pericholecystic stranding. Pancreas: Splenic atrophy without signs of adjacent inflammation. Spleen: Small spleen as before.  No contour abnormality. Adrenals/Urinary Tract: Adrenal glands are normal. Large RIGHT renal cyst is unchanged. Nephrolithiasis of the bilateral kidneys also similar previous imaging and vascular calcifications as well. No hydronephrosis. No perinephric stranding. Cortical scarring mild-to-moderate in greatest on the RIGHT. Urinary bladder is moderately distended without signs of thickening or adjacent  stranding. Stomach/Bowel: No acute gastrointestinal findings. Stomach is under distended limiting assessment. No small bowel dilation. Appendix not seen. No secondary signs to suggest appendiceal inflammation. Abundant stool throughout the colon with mild to moderate distension of the colon but without secondary signs of inflammation. Vascular/Lymphatic: Aortic atherosclerosis. No sign of aneurysm. Smooth contour of the IVC. There is no gastrohepatic or hepatoduodenal ligament lymphadenopathy. No retroperitoneal or mesenteric  lymphadenopathy. No pelvic sidewall lymphadenopathy. Reproductive: No adnexal masses.  Post hysterectomy. Other: No ascites.  Small fat containing umbilical hernia. Musculoskeletal: Spinal degenerative changes. 40% loss of height at the L5 level in this osteopenic patient. No priors for comparison of this location since September of 2020. No surrounding stranding. Mild retropulsion of posterior cortical elements without significant canal narrowing. IMPRESSION: Worsening of disease in the chest with enlarging mediastinal adenopathy and pulmonary metastatic disease. Mixed attenuation and ground-glass nodules without change. No evidence of metastatic disease involving the abdomen or pelvis. Enlarging RIGHT-sided pleural effusion now moderate. Abundant stool in the colon, query constipation. L5 compression fracture since available imaging. Correlate with any pain in this location. Findings are age indeterminate and associated with the proximally 40% loss of height. Bilateral nephrolithiasis and vascular calcifications. Aortic Atherosclerosis (ICD10-I70.0). Electronically Signed   By: Zetta Bills M.D.   On: 03/02/2021 11:25      ASSESSMENT & PLAN:  1. Elevated serum creatinine   2. Metastatic breast cancer ([place])   3. Goals of care, counseling/discussion   4. Encounter for antineoplastic chemotherapy    #Acute kidney failure Labs reviewed and discussed with patient. Creatinine has trended down. Encourage oral hydration  #Hypotension and tachycardia. Likely due to withdrawal effect of beta-blocker. Advised patient to resume her blood pressure medication, advised patient to follow-up with primary care provider  #Metastatic breast cancer,  ER+, PR- HER2 negative.  I had a lengthy discussion with patient's son [name], as well as [name] over the phone. They are aware that patient has metastatic disease which is not curable. Patient denies further treatments. I recommend patient to continue fulvestrant  monthly.  We will arrange. Patient is doing better clinically.  I recommend patient to resume abemaciclib 150 mg twice daily.  I will see patient 1 week after she starts medication, monitor toxicities. Rationale and potential side effects of abemaciclib have been previously discussed in details with patient and her family members.  Patient and her family members agree with the plan. Encourage oral hydration. If she is not able to tolerate abemaciclib, will consider fulvestrant monotherapy. Continue follow-up with palliative care service.  #Pleural effusion, moderate.  She denies shortness of breath.  Monitor symptoms. #Transaminitis, resolved.  Previously due to [place]. # uncontrolled diabetes.follow up with pcp. . All questions were answered. The patient knows to call the clinic with any problems questions or concerns. Return of visit:   Follow-up with me during week of 11/7, lab MD IVF  [name], MD, PhD 04/12/2021

## 2021-04-12 NOTE — Progress Notes (Signed)
Per Dr. Tasia Catchings, no IV fluids today.

## 2021-04-12 NOTE — Progress Notes (Signed)
Pt her for follow up. Pt reports that she has been having acid reflux for about 1 week. BP is elevated, but pt reports that she only took 1 Bp med today because she needs refills on the others.

## 2021-04-13 ENCOUNTER — Inpatient Hospital Stay: Payer: Medicare Other

## 2021-04-13 ENCOUNTER — Other Ambulatory Visit (HOSPITAL_COMMUNITY): Payer: Self-pay

## 2021-04-13 ENCOUNTER — Ambulatory Visit: Payer: Medicare Other | Admitting: Family

## 2021-04-13 LAB — PARATHYROID HORMONE, INTACT (NO CA): PTH: 107 pg/mL — ABNORMAL HIGH (ref 15–65)

## 2021-04-18 ENCOUNTER — Other Ambulatory Visit: Payer: Medicare Other

## 2021-04-18 ENCOUNTER — Other Ambulatory Visit: Payer: Self-pay

## 2021-04-18 ENCOUNTER — Ambulatory Visit: Payer: Medicare Other | Admitting: Oncology

## 2021-04-18 DIAGNOSIS — N182 Chronic kidney disease, stage 2 (mild): Secondary | ICD-10-CM

## 2021-04-18 DIAGNOSIS — I10 Essential (primary) hypertension: Secondary | ICD-10-CM | POA: Diagnosis not present

## 2021-04-23 DIAGNOSIS — E1122 Type 2 diabetes mellitus with diabetic chronic kidney disease: Secondary | ICD-10-CM | POA: Diagnosis not present

## 2021-04-23 DIAGNOSIS — E876 Hypokalemia: Secondary | ICD-10-CM | POA: Diagnosis not present

## 2021-04-23 DIAGNOSIS — E871 Hypo-osmolality and hyponatremia: Secondary | ICD-10-CM | POA: Diagnosis not present

## 2021-04-23 DIAGNOSIS — N1832 Chronic kidney disease, stage 3b: Secondary | ICD-10-CM | POA: Diagnosis not present

## 2021-04-23 DIAGNOSIS — D631 Anemia in chronic kidney disease: Secondary | ICD-10-CM | POA: Diagnosis not present

## 2021-04-23 DIAGNOSIS — I129 Hypertensive chronic kidney disease with stage 1 through stage 4 chronic kidney disease, or unspecified chronic kidney disease: Secondary | ICD-10-CM | POA: Diagnosis not present

## 2021-04-23 DIAGNOSIS — N2581 Secondary hyperparathyroidism of renal origin: Secondary | ICD-10-CM | POA: Diagnosis not present

## 2021-04-26 ENCOUNTER — Inpatient Hospital Stay: Payer: Medicare Other

## 2021-04-26 ENCOUNTER — Encounter: Payer: Self-pay | Admitting: Oncology

## 2021-04-26 ENCOUNTER — Encounter: Payer: Self-pay | Admitting: Family

## 2021-04-26 ENCOUNTER — Inpatient Hospital Stay: Payer: Medicare Other | Attending: Oncology

## 2021-04-26 ENCOUNTER — Other Ambulatory Visit: Payer: Self-pay

## 2021-04-26 ENCOUNTER — Inpatient Hospital Stay (HOSPITAL_BASED_OUTPATIENT_CLINIC_OR_DEPARTMENT_OTHER): Payer: Medicare Other | Admitting: Oncology

## 2021-04-26 VITALS — BP 178/65 | HR 77 | Temp 96.3°F | Resp 20 | Wt 90.0 lb

## 2021-04-26 DIAGNOSIS — N1831 Chronic kidney disease, stage 3a: Secondary | ICD-10-CM | POA: Insufficient documentation

## 2021-04-26 DIAGNOSIS — Z803 Family history of malignant neoplasm of breast: Secondary | ICD-10-CM | POA: Insufficient documentation

## 2021-04-26 DIAGNOSIS — I129 Hypertensive chronic kidney disease with stage 1 through stage 4 chronic kidney disease, or unspecified chronic kidney disease: Secondary | ICD-10-CM | POA: Insufficient documentation

## 2021-04-26 DIAGNOSIS — E1122 Type 2 diabetes mellitus with diabetic chronic kidney disease: Secondary | ICD-10-CM | POA: Diagnosis not present

## 2021-04-26 DIAGNOSIS — M8580 Other specified disorders of bone density and structure, unspecified site: Secondary | ICD-10-CM | POA: Insufficient documentation

## 2021-04-26 DIAGNOSIS — D571 Sickle-cell disease without crisis: Secondary | ICD-10-CM | POA: Diagnosis not present

## 2021-04-26 DIAGNOSIS — J90 Pleural effusion, not elsewhere classified: Secondary | ICD-10-CM | POA: Diagnosis not present

## 2021-04-26 DIAGNOSIS — N182 Chronic kidney disease, stage 2 (mild): Secondary | ICD-10-CM

## 2021-04-26 DIAGNOSIS — R739 Hyperglycemia, unspecified: Secondary | ICD-10-CM

## 2021-04-26 DIAGNOSIS — C50919 Malignant neoplasm of unspecified site of unspecified female breast: Secondary | ICD-10-CM | POA: Diagnosis not present

## 2021-04-26 DIAGNOSIS — Z89612 Acquired absence of left leg above knee: Secondary | ICD-10-CM | POA: Diagnosis not present

## 2021-04-26 DIAGNOSIS — D631 Anemia in chronic kidney disease: Secondary | ICD-10-CM | POA: Diagnosis not present

## 2021-04-26 DIAGNOSIS — Z79811 Long term (current) use of aromatase inhibitors: Secondary | ICD-10-CM | POA: Diagnosis not present

## 2021-04-26 DIAGNOSIS — Z5111 Encounter for antineoplastic chemotherapy: Secondary | ICD-10-CM

## 2021-04-26 DIAGNOSIS — R7989 Other specified abnormal findings of blood chemistry: Secondary | ICD-10-CM

## 2021-04-26 DIAGNOSIS — I252 Old myocardial infarction: Secondary | ICD-10-CM | POA: Diagnosis not present

## 2021-04-26 LAB — COMPREHENSIVE METABOLIC PANEL
ALT: 28 U/L (ref 0–44)
AST: 38 U/L (ref 15–41)
Albumin: 3.3 g/dL — ABNORMAL LOW (ref 3.5–5.0)
Alkaline Phosphatase: 80 U/L (ref 38–126)
Anion gap: 7 (ref 5–15)
BUN: 18 mg/dL (ref 8–23)
CO2: 27 mmol/L (ref 22–32)
Calcium: 8.4 mg/dL — ABNORMAL LOW (ref 8.9–10.3)
Chloride: 103 mmol/L (ref 98–111)
Creatinine, Ser: 1.15 mg/dL — ABNORMAL HIGH (ref 0.44–1.00)
GFR, Estimated: 45 mL/min — ABNORMAL LOW (ref >60)
Glucose, Bld: 219 mg/dL — ABNORMAL HIGH (ref 70–99)
Potassium: 4.1 mmol/L (ref 3.5–5.1)
Sodium: 137 mmol/L (ref 135–145)
Total Bilirubin: 0.5 mg/dL (ref 0.3–1.2)
Total Protein: 6.1 g/dL — ABNORMAL LOW (ref 6.5–8.1)

## 2021-04-26 LAB — CBC WITH DIFFERENTIAL/PLATELET
Abs Immature Granulocytes: 0.02 10*3/uL (ref 0.00–0.07)
Basophils Absolute: 0.1 10*3/uL (ref 0.0–0.1)
Basophils Relative: 1 %
Eosinophils Absolute: 0.2 10*3/uL (ref 0.0–0.5)
Eosinophils Relative: 4 %
HCT: 29 % — ABNORMAL LOW (ref 36.0–46.0)
Hemoglobin: 9.4 g/dL — ABNORMAL LOW (ref 12.0–15.0)
Immature Granulocytes: 0 %
Lymphocytes Relative: 25 %
Lymphs Abs: 1.4 10*3/uL (ref 0.7–4.0)
MCH: 31.3 pg (ref 26.0–34.0)
MCHC: 32.4 g/dL (ref 30.0–36.0)
MCV: 96.7 fL (ref 80.0–100.0)
Monocytes Absolute: 0.6 10*3/uL (ref 0.1–1.0)
Monocytes Relative: 11 %
Neutro Abs: 3.3 10*3/uL (ref 1.7–7.7)
Neutrophils Relative %: 59 %
Platelets: 347 10*3/uL (ref 150–400)
RBC: 3 MIL/uL — ABNORMAL LOW (ref 3.87–5.11)
RDW: 17.1 % — ABNORMAL HIGH (ref 11.5–15.5)
WBC: 5.6 10*3/uL (ref 4.0–10.5)
nRBC: 0 % (ref 0.0–0.2)

## 2021-04-26 MED ORDER — FULVESTRANT 250 MG/5ML IM SOSY
500.0000 mg | PREFILLED_SYRINGE | Freq: Once | INTRAMUSCULAR | Status: AC
  Administered 2021-04-26: 500 mg via INTRAMUSCULAR
  Filled 2021-04-26: qty 10

## 2021-04-26 NOTE — Progress Notes (Signed)
Patient states she hot sensation starting in abdominal area and went through the rest of the body and she started to feel really sickly. Patient states this lasted for a few minutes. She stated she started to go the hospital but there was to many people.

## 2021-04-26 NOTE — Progress Notes (Signed)
**Note Tiffany-Identified via Obfuscation** Hematology/Oncology  Follow up note [phone] [phone]   Patient Care Team: Pcp, No as PCP - General Tiffany Julian Jonette Eva, NP as Nurse Practitioner (Hospice and Palliative Medicine) [name], MD as Consulting Physician (Oncology) Tiffany Bailey, RPH-CPP (Pharmacist) Tiffany Merritts, MD as Consulting Physician (Cardiology) Tiffany Grill, NP as Nurse Practitioner (Nurse Practitioner)  REFERRING PROVIDER: No ref. provider found  CHIEF COMPLAINTS/REASON FOR VISIT:  Follow up for metastatic breast cancer  HISTORY OF PRESENTING ILLNESS:   Tiffany Bailey is a  87 y.o.  female with PMH listed below was seen in consultation at the request of  No ref. provider found  for evaluation of abnormal CT scan. Patient was recently evaluated by primary care provider for complaints of urinary retention, headache, constipation. 02/03/2019 x-ray of abdomen showed large colonic stool volume, nodular densities noted in the right mid lung are up). 02/08/2019 subsequent chest x-ray two-view showed hyperinflation of the lungs compatible with COPD.  Multiple nodular densities project over the mid and lower right lung. CT chest was obtained for further evaluation. 02/17/2019 CT chest without contrast Showed multiple bilateral pulmonary nodules,groundglass opacities. Largest solid nodule at the confluence of the right major and minor fissures measuring 1.6 cm.  Most notable groundglass nodule is of the right upper lobe and measure 1.8 x 0.8 cm, small right pleural effusion with associated atelectasis or consolidation.  Larger solid nodule are highly suspicious for metastatic disease. Bulky right mediastinal and hilar lymph node.  Largest pretracheal nodes measuring 3.5 x 3 cm.  There are additional bulky superior mediastinal, supraclavicular, left subpectoral lymph nodes.  Largest subpectoral nodes measuring 3.3 x 2.5 cm.  Patient was sent to cancer center for further  evaluation.  #Patient reports a history of left breast cancer status post mastectomy.  She is a poor historian.  She is not able to recall the timeframe of breast cancer diagnosis and then treatments.  Denies any chemotherapy or radiation treatments.  #Left lower extremity history of amputation, CKD, History of MI, Sickle cell disease, DM, HTN, anemia.   Patient denies shortness of breath, chest pain, hemoptysis.  Night sweating, abdominal pain. Reports intentional weight loss.  Appetite is poor.  Not eating much. Also have wax and wane, chronic intermittent headache, she takes Tylenol as needed, with some relief.  Denies any nausea vomiting. She sees neurology. 11/10/2018 CT head showed no acute intracranial abnormality or significant interval changes.  Stable atrophy and white matter disease.  Atherosclerosis.  #Patient had a consultation visit with me on 02/19/2019.  At that time he was referred for abnormal CT scan.  PET scan was obtained and I recommend patient to obtain left axillary lymph node biopsy.  Patient/family called back and decided not to proceed with any additional work-up.  #February 2020 started Arimidex # 03/30/2021 - 03/31/2021 Admission due to AKI, felt to be due to dehydration, side effects of Jardiance.   INTERVAL HISTORY Tiffany Bailey is a 87 y.o. female who has above history reviewed by me today presents for follow up visit for management of metastatic breast cancer. Patient was accompanied by her daughter [name]. During the last visit, we discussed about plan of resuming abemaciclib.  Patient has not started medication yet. Today she feels well.  Recently her blood pressure medication has been adjusted-increase of valsartan. Appetite is fair.  Patient has gained 5 pounds . Review of Systems  Constitutional:  Positive for fatigue. Negative for appetite change, chills, fever and unexpected weight change.  HENT:   Negative for hearing loss and voice change.   Eyes:   Negative for eye problems.  Respiratory:  Negative for chest tightness, cough and shortness of breath.   Cardiovascular:  Negative for chest pain.  Gastrointestinal:  Negative for abdominal distention, abdominal pain, blood in stool and constipation.  Endocrine: Negative for hot flashes.  Genitourinary:  Negative for difficulty urinating and frequency.   Musculoskeletal:  Negative for arthralgias.       Left lower extremity history of amputation, intermittent sharp pain  Skin:  Negative for itching and rash.  Neurological:  Negative for extremity weakness and headaches.  Hematological:  Negative for adenopathy.  Psychiatric/Behavioral:  Negative for confusion.    MEDICAL HISTORY:  Past Medical History:  Diagnosis Date   Anemia    Anginal pain ([place])    Arthritis    Breast cancer ([place]) 07/19/2019   CKD (chronic kidney disease), stage III ([place])    Coronary artery disease    a. 2002 CABG x 3 (LIMA-LAD, SVG-D1, SVG-RCA); b. s/p multiple PCI's.   Diabetes mellitus without complication ([place])    Diastolic dysfunction    a. 12/2017 Echo: EF 55-60%, no rwma, Gr1 DD. Triv AI. Mild-mod MR/TR. PASP 61mHg.   Edema    Failure to thrive in adult    GERD (gastroesophageal reflux disease)    Hb-SS disease without crisis ([place] 10/23/2017   History of breast cancer    HOH (hard of hearing)    Hyperlipidemia    Hypertension    Myocardial infarction ([facility]    PAD (peripheral artery disease) ([place]    a. s/p L AKA; b. 12/2018 PTA R peroneal & PTA/Stenting R SFA.   Palpitations     SURGICAL HISTORY: Past Surgical History:  Procedure Laterality Date   ABDOMINAL HYSTERECTOMY     ABOVE KNEE LEG AMPUTATION Left 2013   ANTERIOR VITRECTOMY Left 11/16/2015   Procedure: ANTERIOR VITRECTOMY;  Surgeon: BEulogio Bear MD;  Location: [facility] ORS;  Service: Ophthalmology;  Laterality: Left;   BLADDER SURGERY     CATARACT EXTRACTION W/PHACO Left 11/16/2015   Procedure: CATARACT EXTRACTION PHACO AND INTRAOCULAR  LENS PLACEMENT (IOC);  Surgeon: BEulogio Bear MD;  Location: [facility] ORS;  Service: Ophthalmology;  Laterality: Left;  Lot # [id number] [place] 01:22.8 AP%:12.6 CDE: 10.46   CATARACT EXTRACTION W/PHACO Right 08/22/2016   Procedure: CATARACT EXTRACTION PHACO AND INTRAOCULAR LENS PLACEMENT (IOC);  Surgeon: BEulogio Bear MD;  Location: [facility] ORS;  Service: Ophthalmology;  Laterality: Right;  Lot # [id number] [place] 00:52.1 AP%:8.7 CDE: 4.99    CORONARY ANGIOPLASTY     STENT   CORONARY ARTERY BYPASS GRAFT     LOWER EXTREMITY ANGIOGRAPHY Right 01/14/2019   Procedure: LOWER EXTREMITY ANGIOGRAPHY;  Surgeon: DAlgernon Huxley MD;  [facility];  Service: Cardiovascular;  Laterality: Right;   MASTECTOMY     TUMOR REMOVAL     ABDOMINAL    SOCIAL HISTORY: Social History   Socioeconomic History   Marital status: Divorced    Spouse name: Not on file   Number of children: 7   Years of education: Not on file   Highest education level: Not on file  Occupational History   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  Tobacco Use   Smoking status: Never   Smokeless tobacco: Never  Substance and Sexual Activity   Alcohol use: No   Drug use: No   Sexual activity: Not Currently  Other Topics Concern  Not on file  Social History Narrative   Not on file   Social Determinants of Health   Financial Resource Strain: Low Risk    Difficulty of Paying Living Expenses: Not hard at all  Food Insecurity: Not on file  Transportation Needs: Not on file  Physical Activity: Not on file  Stress: Not on file  Social Connections: Not on file  Intimate Partner Violence: Not on file    FAMILY HISTORY: Family History  Problem Relation Age of Onset   Sudden death Mother    Arthritis Father    Stroke Father    Breast cancer Sister    Diabetes Grandchild     ALLERGIES:  is allergic to amoxicillin, gabapentin, mirtazapine, and other.  MEDICATIONS:  Current Outpatient Medications   Medication Sig Dispense Refill   amLODipine (NORVASC) 10 MG tablet TAKE 1 TABLET BY MOUTH  DAILY 30 tablet 0   aspirin EC 81 MG tablet Take 1 tablet (81 mg total) by mouth daily. 30 tablet 5   atorvastatin (LIPITOR) 40 MG tablet TAKE 1 TABLET BY MOUTH  DAILY 90 tablet 3   blood glucose meter kit and supplies KIT Dispense based on patient and insurance preference. Use up to four times daily as directed. (FOR ICD-9 250.00, 250.01). 1 each 0   clopidogrel (PLAVIX) 75 MG tablet TAKE 1 TABLET BY MOUTH  DAILY 30 tablet 0   feeding supplement (ENSURE ENLIVE / ENSURE PLUS) LIQD Take 237 mLs by mouth 2 (two) times daily between meals.     ferrous gluconate (FERGON) 324 MG tablet Take 324 mg by mouth 2 (two) times daily.     glucose blood (ONE TOUCH TEST STRIPS) test strip Use to test blood sugar twice daily 200 each 3   hydrALAZINE (APRESOLINE) 10 MG tablet Take 1 tablet (10 mg total) by mouth in the morning and at bedtime. 180 tablet 3   insulin degludec (TRESIBA FLEXTOUCH) 100 UNIT/ML FlexTouch Pen Hold until followup with your outpatient doctor, since you are not eating well, and your blood sugars were low in the hospital even without insulin. (Patient taking differently: 6 Units. Hold until followup with your outpatient doctor, since you are not eating well, and your blood sugars were low in the hospital even without insulin.) 6 mL 1   Insulin Pen Needle (BD PEN NEEDLE NANO U/F) 32G X 4 MM MISC USE 1 DAILY 100 each 12   Metoprolol Tartrate 37.5 MG TABS Take 1 tablet by mouth 2 (two) times daily.     nitroGLYCERIN (NITROSTAT) 0.4 MG SL tablet Place 1 tablet (0.4 mg total) under the tongue every 5 (five) minutes as needed for chest pain. 25 tablet 4   ONE TOUCH LANCETS MISC Use as directed 2 times per day 200 each 3   valsartan (DIOVAN) 80 MG tablet Take by mouth.     vitamin C (ASCORBIC ACID) 250 MG tablet Take 250 mg by mouth daily.     abemaciclib (VERZENIO) 150 MG tablet Take 1 tablet (150 mg total)  by mouth 2 (two) times daily. Swallow tablets whole. Do not chew, crush, or split tablets before swallowing. (Patient not taking: No sig reported) 56 tablet 0   No current facility-administered medications for this visit.     PHYSICAL EXAMINATION: ECOG PERFORMANCE STATUS: 2 - Symptomatic, <50% confined to bed Vitals:   04/26/21 1020  BP: (!) 178/65  Pulse: 77  Resp: 20  Temp: (!) 96.3 F (35.7 C)  SpO2: 100%   Filed  Weights   04/26/21 1020  Weight: 90 lb (40.8 kg)    Physical Exam Constitutional:      General: She is not in acute distress.    Comments: She sits in the wheel chair.  Frail appearance  HENT:     Head: Normocephalic and atraumatic.  Eyes:     General: No scleral icterus.    Pupils: Pupils are equal, round, and reactive to light.  Cardiovascular:     Rate and Rhythm: Normal rate and regular rhythm.     Heart sounds: Normal heart sounds.  Pulmonary:     Effort: Pulmonary effort is normal. No respiratory distress.     Breath sounds: No wheezing.     Comments: Decreased breath sound right lower lobe.  Good air entry on the left base.  Abdominal:     General: Bowel sounds are normal. There is no distension.     Palpations: Abdomen is soft.  Musculoskeletal:        General: No deformity. Normal range of motion.     Cervical back: Normal range of motion and neck supple.     Comments: Left lower extremity history of amputation.   Skin:    General: Skin is warm and dry.     Findings: No erythema or rash.  Neurological:     Mental Status: She is alert and oriented to person, place, and time. Mental status is at baseline.     Cranial Nerves: No cranial nerve deficit.     Coordination: Coordination normal.  Psychiatric:        Mood and Affect: Mood normal.     LABORATORY DATA:  I have reviewed the data as listed Lab Results  Component Value Date   WBC 5.6 04/26/2021   HGB 9.4 (L) 04/26/2021   HCT 29.0 (L) 04/26/2021   MCV 96.7 04/26/2021   PLT 347  04/26/2021   Recent Labs    03/30/21 1650 03/31/21 0428 04/03/21 0955 04/12/21 0953 04/26/21 1003  NA  --    < > 133* 138  135 137  K  --    < > 4.1 3.4*  3.2* 4.1  CL  --    < > 109 104  103 103  CO2  --    < > 18* 24  24 27   GLUCOSE  --    < > 126* 114*  109* 219*  BUN  --    < > 27* 13  13 18   CREATININE  --    < > 1.88* 1.20*  1.19* 1.15*  CALCIUM  --    < > 8.4* 9.0  8.7* 8.4*  GFRNONAA  --    < > 25* 43*  43* 45*  PROT 6.5   < > 6.4* 6.5 6.1*  ALBUMIN 3.1*   < > 3.2* 3.3*  3.3* 3.3*  AST 165*   < > 80* 39 38  ALT 167*   < > 100* 41 28  ALKPHOS 89   < > 78 82 80  BILITOT 0.7   < > 0.4 0.8 0.5  BILIDIR 0.1  --   --   --   --   IBILI 0.6  --   --   --   --    < > = values in this interval not displayed.    Iron/TIBC/Ferritin/ %Sat    Component Value Date/Time   IRON 33 01/12/2021 1133   TIBC 220 (L) 01/12/2021 1133   FERRITIN 74 01/12/2021 1133  IRONPCTSAT 15 01/12/2021 1133   IRONPCTSAT 27 06/19/2018 1006      RADIOGRAPHIC STUDIES: I have personally reviewed the radiological images as listed and agreed with the findings in the report. DG Chest 2 View  Result Date: 03/30/2021 CLINICAL DATA:  Weakness. EXAM: CHEST - 2 VIEW COMPARISON:  Chest x-ray 02/13/2021, CT chest 03/21/2020 chest x-ray 10/17/2017, CT chest 03/01/2021 FINDINGS: The heart and mediastinal contours are unchanged. Aortic calcification. Coronary artery stents. Hyperinflation of the lungs. Slightly less conspicuous patchy nodular like densities. No pulmonary edema. Blunting of bilateral costophrenic angles with trace bilateral pleural effusions not excluded. No pneumothorax. No acute osseous abnormality. IMPRESSION: Slightly less conspicuous patchy nodular like densities. Electronically Signed   By: Iven Finn M.D.   On: 03/30/2021 17:35   DG Chest 2 View  Result Date: 02/14/2021 CLINICAL DATA:  Persistent cough EXAM: CHEST - 2 VIEW COMPARISON:  CT from 03/21/2020 FINDINGS: Cardiac  shadow is within normal limits. Postsurgical changes are noted. Lungs are well aerated bilaterally. No focal infiltrate is seen. Small right-sided pleural effusion is noted stable from the prior exam with minimal basilar atelectasis. Nodular changes are noted in the right mid lung and right lung base increased from the prior CT examination. Repeat CT is recommended for follow-up. No bony abnormality is seen. IMPRESSION: Increase in nodular changes within the right lung when compare with the prior exam likely related to increasing metastatic disease. CT of the chest with contrast is recommended for further evaluation. These results will be called to the ordering clinician or representative by the Radiologist Assistant, and communication documented in the PACS or [employer]. Electronically Signed   By: [name] M.D.   On: 02/14/2021 09:56   CT CHEST ABDOMEN PELVIS WO CONTRAST  Result Date: 03/02/2021 CLINICAL DATA:  History of metastatic breast cancer in a 87 year old female EXAM: CT CHEST, ABDOMEN AND PELVIS WITHOUT CONTRAST TECHNIQUE: Multidetector CT imaging of the chest, abdomen and pelvis was performed following the standard protocol without IV contrast. COMPARISON:  October of 2021 FINDINGS: CT CHEST FINDINGS Cardiovascular: Calcified atheromatous plaque in the thoracic aorta. No aneurysmal dilation. Three-vessel coronary artery disease, abundant three-vessel disease. No substantial pericardial effusion. Normal caliber of central pulmonary vessels. Mediastinum/Nodes: Bulky precarinal/RIGHT paratracheal adenopathy 2.7 cm (image 23/7) previously approximately 1.5 cm. Bulky subcarinal lymphadenopathy (image 26/7) 2.5 cm, previously 1.9 cm. No thoracic inlet adenopathy, axillary adenopathy or internal mammary adenopathy. Smaller lymph nodes identified in the upper mediastinum on the previous study are unchanged. Lungs/Pleura: Enlarging RIGHT-sided effusion now moderate and layering posteriorly multiple  pulmonary nodules. (Image 65/8) 1.9 cm greatest axial dimension, RIGHT upper lobe posterior pulmonary nodule with mixed attenuation measuring 1.9 cm previously. (Image 90/8) new 7 x 6 mm pulmonary nodule in the LEFT upper lobe along the pleural surface. (Image 97/8) nodule along an accessory fissure in the RIGHT lower lobe measuring 12 x 9 mm previously 9 mm greatest dimension. (Image 84/8) 7 mm perifissural nodule previously 3 mm. Nodularity along the confluence of fissures in the RIGHT mid chest now with increased thickness at 6 mm greatest thickness, previously 4 mm. This spans 1.8 cm of the fissure previously 1.6 cm. Enlarging Peri fissural nodule (image 46/8) 7 mm previously 2-3 mm. Areas of ground-glass attenuation in the chest in the LEFT chest and RIGHT upper lobe show a similar appearance. Airways are patent. Basilar volume loss on the RIGHT. Musculoskeletal: See below for full musculoskeletal details. CT ABDOMEN PELVIS FINDINGS Hepatobiliary: Normal liver on noncontrast imaging.  No pericholecystic stranding. Pancreas: Splenic atrophy without signs of adjacent inflammation. Spleen: Small spleen as before.  No contour abnormality. Adrenals/Urinary Tract: Adrenal glands are normal. Large RIGHT renal cyst is unchanged. Nephrolithiasis of the bilateral kidneys also similar previous imaging and vascular calcifications as well. No hydronephrosis. No perinephric stranding. Cortical scarring mild-to-moderate in greatest on the RIGHT. Urinary bladder is moderately distended without signs of thickening or adjacent stranding. Stomach/Bowel: No acute gastrointestinal findings. Stomach is under distended limiting assessment. No small bowel dilation. Appendix not seen. No secondary signs to suggest appendiceal inflammation. Abundant stool throughout the colon with mild to moderate distension of the colon but without secondary signs of inflammation. Vascular/Lymphatic: Aortic atherosclerosis. No sign of aneurysm. Smooth  contour of the IVC. There is no gastrohepatic or hepatoduodenal ligament lymphadenopathy. No retroperitoneal or mesenteric lymphadenopathy. No pelvic sidewall lymphadenopathy. Reproductive: No adnexal masses.  Post hysterectomy. Other: No ascites.  Small fat containing umbilical hernia. Musculoskeletal: Spinal degenerative changes. 40% loss of height at the L5 level in this osteopenic patient. No priors for comparison of this location since September of 2020. No surrounding stranding. Mild retropulsion of posterior cortical elements without significant canal narrowing. IMPRESSION: Worsening of disease in the chest with enlarging mediastinal adenopathy and pulmonary metastatic disease. Mixed attenuation and ground-glass nodules without change. No evidence of metastatic disease involving the abdomen or pelvis. Enlarging RIGHT-sided pleural effusion now moderate. Abundant stool in the colon, query constipation. L5 compression fracture since available imaging. Correlate with any pain in this location. Findings are age indeterminate and associated with the proximally 40% loss of height. Bilateral nephrolithiasis and vascular calcifications. Aortic Atherosclerosis (ICD10-I70.0). Electronically Signed   By: Zetta Bills M.D.   On: 03/02/2021 11:25      ASSESSMENT & PLAN:  1. Metastatic breast cancer ([place])   2. Encounter for antineoplastic chemotherapy   3. Pleural effusion   4. Hyperglycemia     #Metastatic breast cancer,  ER+, PR- HER2 negative.  Patient has not started on abemaciclib yet.  We discussed about the rationale and potential side effects. Recommend patient to start abemaciclib 150 mg twice daily.  Labs reviewed and discussed with patient Encourage oral hydration.  Proceed with fulvestrant injection today.  #Pleural effusion, moderate.  She denies shortness of breath.  Monitor symptoms. #uncontrolled diabetes.follow up with pcp. . All questions were answered. The patient knows to call the  clinic with any problems questions or concerns. Return of visit:   Follow-up with nurse practitioner in 1 week for evaluation of tolerability-11/17 Fulvestrant injection on 11/28 Follow-up lab MD 4 weeks.  12/8  [name], MD, PhD 04/26/2021

## 2021-04-26 NOTE — Progress Notes (Signed)
Nutrition Assessment   Reason for Assessment:  Referral from [place], NP   ASSESSMENT:  87 year old female with stage IV breast cancer.  Past medical history of COPD, HTN, DM, CKD, CAD, sickle cell.  Patient is on fulvestrant monthly  Patient was only in infusion for few minutes today so RD unable to see her.  Called and spoke with patient via phone.  Patient reports that her appetite is pretty good but feels up fast.  Says that she has issues with constipation.  Takes something but says is makes her go too much.  Says that she usually has [place] sausage link, egg, biscuit and coffee for breakfast.  Around 3pm will eat chicken leg, okra, cabbage or green beans.  Will have an ensure shake later on in the evening.  Usually drinks one a day.    Says that her acid reflux is better after starting medication.  Sometimes feels nauseated   Medications: reviewed   Labs: glucose 219 (says she drank a coke and has been eating some Halloween candy)   Anthropometrics:   Height: 60 inches Weight: 90 lb today (reports swelling) UBW: 84-88 lb BMI: 16   Estimated Energy Needs  Kcals: 1200-1400 Protein: 60-70 g Fluid: 1.2 L   NUTRITION DIAGNOSIS: Underweight related to cancer and cancer related treatment side effects as evidenced by BMI 16   INTERVENTION:  Encouraged patient to try half dose of constipation medication.  Will mail handout on constipation.   Encouraged 350+ calorie shake 1-2 ties per day.  Patient says she thinks she can drink one in the morning too Reviewed ways to add calories to food.  Handout to be mailed.   Contact information mailed.   MONITORING, EVALUATION, GOAL: weight trends, intake   Next Visit: Dec 8 phone visit (Thursday)  Athony Coppa B. Zenia Resides, [place], [place] Registered Dietitian [phone] (mobile)

## 2021-04-27 ENCOUNTER — Other Ambulatory Visit: Payer: Self-pay

## 2021-04-27 ENCOUNTER — Telehealth: Payer: Self-pay

## 2021-04-27 DIAGNOSIS — C50919 Malignant neoplasm of unspecified site of unspecified female breast: Secondary | ICD-10-CM

## 2021-04-27 NOTE — Telephone Encounter (Signed)
Please schedule patient as requestes by MD. She can pick up AVS when she see's Lauren next week. Thanks

## 2021-04-27 NOTE — Telephone Encounter (Signed)
-----   Message from [name], MD sent at 04/26/2021  9:05 PM EST ----- I was planning to see her next week but no spots available. So she is scheduled to see Ander Purpura- this part is already done.  Please also schedule her to get Fulvestrant on 11/28 And schedule her to see me lab-cbc cmp  md on 12/8.[ No fulvestrant]

## 2021-04-30 ENCOUNTER — Other Ambulatory Visit: Payer: Self-pay

## 2021-04-30 ENCOUNTER — Inpatient Hospital Stay (HOSPITAL_BASED_OUTPATIENT_CLINIC_OR_DEPARTMENT_OTHER): Payer: Medicare Other | Admitting: Hospice and Palliative Medicine

## 2021-04-30 DIAGNOSIS — C50919 Malignant neoplasm of unspecified site of unspecified female breast: Secondary | ICD-10-CM

## 2021-04-30 DIAGNOSIS — Z515 Encounter for palliative care: Secondary | ICD-10-CM

## 2021-04-30 NOTE — Progress Notes (Signed)
Virtual Visit via Telephone Note  I connected with Tiffany Bailey on 04/30/21 at  1:15 PM EST by telephone and verified that I am speaking with the correct person using two identifiers.  Location: Patient: Home Provider: Clinic   I discussed the limitations, risks, security and privacy concerns of performing an evaluation and management service by telephone and the availability of in person appointments. I also discussed with the patient that there may be a patient responsible charge related to this service. The patient expressed understanding and agreed to proceed.   History of Present Illness: Tiffany Bailey is a 87 y.o. female with multiple medical problems including COPD, PVD status post history of left lower extremity amputation, CKD, CAD, sickle cell disease, diabetes, hypertension, and stage IV breast cancer.  CT of the chest, abdomen, and pelvis on 03/01/2021 revealed worsening adenopathy in the chest and enlarging right-sided pleural effusion with multiple pulmonary nodules concerning for disease progression.  Patient was rotated from Arimidex to fulvestrant plus abemaciclib but has had poor tolerance of treatment.  Patient was referred to palliative care to address goals.   Observations/Objective: I spoke with patient by phone. She reports that she is "doing better" with appetite and she feels less weak. She is still reliant on family support for help around the home (she is an amputee). Patient is pending eval by clinic OT and then will see community palliative care next week.   Patient denies any active or distressing symptoms at present. No reported issues with medications nor need for refills.   Discussed upcoming appointments with patient.   Assessment and Plan: Stage IV Breast Cancer - on treatment with abemaciclib. Reportedly doing well with minimal symptoms. Will continue to follow.   Follow Up Instructions: Follow up telephone visit in 1-2 months.    I discussed the  assessment and treatment plan with the patient. The patient was provided an opportunity to ask questions and all were answered. The patient agreed with the plan and demonstrated an understanding of the instructions.   The patient was advised to call back or seek an in-person evaluation if the symptoms worsen or if the condition fails to improve as anticipated.  I provided 5 minutes of non-face-to-face time during this encounter.   Irean Hong, NP

## 2021-04-30 NOTE — Telephone Encounter (Signed)
LMTCB

## 2021-05-02 ENCOUNTER — Inpatient Hospital Stay: Payer: Medicare Other | Admitting: Occupational Therapy

## 2021-05-02 ENCOUNTER — Other Ambulatory Visit: Payer: Self-pay

## 2021-05-02 DIAGNOSIS — R531 Weakness: Secondary | ICD-10-CM

## 2021-05-02 NOTE — Therapy (Signed)
[facility] [address] [place], [place], [place], [address] Phone: [phone]   Fax:  [phone]  Occupational Therapy screen:  Patient Details  Name: Tiffany Bailey MRN: [id number] Date of Birth: 1928/02/19 No data recorded  Encounter Date: 05/02/2021   OT End of Session - 05/02/21 1904     Visit Number 0             Past Medical History:  Diagnosis Date   Anemia    Anginal pain ([place])    Arthritis    Breast cancer ([place]) 07/19/2019   CKD (chronic kidney disease), stage III ([place])    Coronary artery disease    a. 2002 CABG x 3 (LIMA-LAD, SVG-D1, SVG-RCA); b. s/p multiple PCI's.   Diabetes mellitus without complication ([place])    Diastolic dysfunction    a. 12/2017 Echo: EF 55-60%, no rwma, Gr1 DD. Triv AI. Mild-mod MR/TR. PASP 17mmHg.   Edema    Failure to thrive in adult    GERD (gastroesophageal reflux disease)    Hb-SS disease without crisis ([place]) 10/23/2017   History of breast cancer    HOH (hard of hearing)    Hyperlipidemia    Hypertension    Myocardial infarction [facility])    PAD (peripheral artery disease) ([place])    a. s/p L AKA; b. 12/2018 PTA R peroneal & PTA/Stenting R SFA.   Palpitations     Past Surgical History:  Procedure Laterality Date   ABDOMINAL HYSTERECTOMY     ABOVE KNEE LEG AMPUTATION Left 2013   ANTERIOR VITRECTOMY Left 11/16/2015   Procedure: ANTERIOR VITRECTOMY;  Surgeon: Eulogio Bear, MD;  Location: [facility] ORS;  Service: Ophthalmology;  Laterality: Left;   BLADDER SURGERY     CATARACT EXTRACTION W/PHACO Left 11/16/2015   Procedure: CATARACT EXTRACTION PHACO AND INTRAOCULAR LENS PLACEMENT (IOC);  Surgeon: Eulogio Bear, MD;  Location: [facility] ORS;  Service: Ophthalmology;  Laterality: Left;  Lot # [id number] H [place]; 01:22.8 AP%:12.6 CDE: 10.46   CATARACT EXTRACTION W/PHACO Right 08/22/2016   Procedure: CATARACT EXTRACTION PHACO AND INTRAOCULAR LENS PLACEMENT (IOC);  Surgeon: Eulogio Bear, MD;  Location: [facility] ORS;  Service: Ophthalmology;  Laterality: Right;  Lot # [id number] H [place]: 00:52.1 AP%:8.7 CDE: 4.99    CORONARY ANGIOPLASTY     STENT   CORONARY ARTERY BYPASS GRAFT     LOWER EXTREMITY ANGIOGRAPHY Right 01/14/2019   Procedure: LOWER EXTREMITY ANGIOGRAPHY;  Surgeon: Algernon Huxley, MD;  Location: [facility];  Service: Cardiovascular;  Laterality: Right;   MASTECTOMY     TUMOR REMOVAL     ABDOMINAL    There were no vitals filed for this visit.   Subjective Assessment - 05/02/21 1903     Subjective  I am doing better now that my hydration is better- I can transfer  and do more of my bathing and dressing - do most of the bathing at sink - but I do like to get down in tub - my son did put up railing for me             Vonna Kotyk [employer] consultation 04/30/21:  History of Present Illness: Tiffany Bailey is a 87 y.o. female with multiple medical problems including COPD, PVD status post history of left lower extremity amputation, CKD, CAD, sickle cell disease, diabetes, hypertension, and stage IV breast cancer.  CT of the chest, abdomen, and pelvis on 03/01/2021 revealed worsening adenopathy in the chest and enlarging right-sided  pleural effusion with multiple pulmonary nodules concerning for disease progression.  Patient was rotated from Arimidex to fulvestrant plus abemaciclib but has had poor tolerance of treatment.  Patient was referred to palliative care to address goals.   Observations/Objective: I spoke with patient by phone. She reports that she is "doing better" with appetite and she feels less weak. She is still reliant on family support for help around the home (she is an amputee). Patient is pending eval by clinic OT and then will see community palliative care next week.   Patient denies any active or distressing symptoms at present. No reported issues with medications nor need for refills.   OT SCREEN 05/02/21: Pt report living in one story house -  ramp to get in  Do most of the time sponge bath at sink -but do like to get into bath tub - has railing - she gets in tub  Doing better since week ago after hydration got better  Able to transfer better and do her own bathing and dressing Mostly of time in w/c at home - dong some things around the house , work puzzles Her prosthesis causing her some pain on amputation - and not able to wear it the last year - try but to much pain Still having some phantom pain  In session pt able to stand at counter about  1 min and 30 sec and 2 min - holding on with one hand Fatigue limiting her more than pain O2 levels stayed 96-98% and HR 61  Joshue  Borders NP report  did set up [place] starting next week  I  would  also recommend PT and OT for at home for strengthening , balance and functional transfers and safety to decrease fall. Assess prosthesis fit and DME recommendations.                                              Visit Diagnosis: Weakness    Problem List Patient Active Problem List   Diagnosis Date Noted   Hyperglycemia 04/26/2021   Encounter for antineoplastic chemotherapy 04/12/2021   Pressure injury of skin 03/31/2021   Malignant neoplasm of breast in female, estrogen receptor positive ([place]) 03/30/2021   Pleural effusion 03/14/2021   Cough 02/13/2021   Cancer ([place]) 11/07/2020   Type 2 diabetes mellitus with diabetic neuropathy, unspecified ([place]) 11/02/2020   Generalized weakness 12/15/2019   Osteopenia 10/25/2019   Use of anastrozole (Arimidex) 08/18/2019   Anemia due to stage 3a chronic kidney disease ([place]) 08/18/2019   Ulcerative nasal mucositis 08/03/2019   Nausea 08/03/2019   Breast cancer ([place]) 07/19/2019   Goals of care, counseling/discussion 07/19/2019   Lung mass 02/10/2019   Constipation 01/27/2019   Headache 01/25/2019   Right leg swelling 01/25/2019   Metastatic breast cancer ([place]) 01/05/2019   Anxiety and  depression 12/16/2018   Hx of AKA (above knee amputation), left ([place]) 08/26/2018   Left arm swelling 07/01/2018   Carotid stenosis 12/19/2017   CAD (coronary artery disease) 10/23/2017   Hb-SS disease without crisis ([place]) 10/23/2017   History of CHF (congestive heart failure) 10/23/2017   Old myocardial infarction 10/23/2017   Hand cramp 10/17/2017   Acute non-recurrent maxillary sinusitis 10/17/2017   Skin lesion 09/29/2017   Diarrhea 04/23/2017   Weight loss 09/12/2016   CKD stage 3 due to type 2 diabetes  mellitus ([place]) 08/14/2016   PAD (peripheral artery disease) ([place]) 08/14/2016   DM (diabetes mellitus) ([place]) 03/25/2016   Chronic tension-type headache, not intractable 03/25/2016   Essential hypertension 03/25/2016   CAD (coronary artery disease), native coronary artery 03/25/2016   GERD (gastroesophageal reflux disease) 03/25/2016   Hyperlipidemia 03/25/2016   History of breast cancer 03/25/2016   Acute kidney injury superimposed on chronic kidney disease ([place]) 01/28/2016   Hyperkalemia 01/28/2016   Hyponatremia 01/28/2016   Urinary retention 01/28/2016   Hypertensive urgency 01/15/2016   History of nonadherence to medical treatment 09/08/2015   Epigastric pain 08/10/2014   Ischemic heart disease due to coronary artery obstruction ([place]) 01/07/2013    Rosalyn Gess, OTR/L,CLT 05/02/2021, 7:04 PM  [facility] [address], [place], [place], [address] Phone: [phone]   Fax:  [phone]  Name: KENZA MUNAR MRN: [id number] Date of Birth: 05/11/1928

## 2021-05-03 ENCOUNTER — Inpatient Hospital Stay: Payer: Medicare Other

## 2021-05-03 ENCOUNTER — Encounter: Payer: Self-pay | Admitting: Nurse Practitioner

## 2021-05-03 ENCOUNTER — Inpatient Hospital Stay: Payer: Medicare Other | Admitting: Pharmacist

## 2021-05-03 ENCOUNTER — Inpatient Hospital Stay (HOSPITAL_BASED_OUTPATIENT_CLINIC_OR_DEPARTMENT_OTHER): Payer: Medicare Other | Admitting: Nurse Practitioner

## 2021-05-03 VITALS — BP 167/55 | HR 65 | Temp 96.4°F | Resp 17 | Wt 90.0 lb

## 2021-05-03 DIAGNOSIS — I129 Hypertensive chronic kidney disease with stage 1 through stage 4 chronic kidney disease, or unspecified chronic kidney disease: Secondary | ICD-10-CM | POA: Diagnosis not present

## 2021-05-03 DIAGNOSIS — E1122 Type 2 diabetes mellitus with diabetic chronic kidney disease: Secondary | ICD-10-CM | POA: Diagnosis not present

## 2021-05-03 DIAGNOSIS — Z803 Family history of malignant neoplasm of breast: Secondary | ICD-10-CM | POA: Diagnosis not present

## 2021-05-03 DIAGNOSIS — C50919 Malignant neoplasm of unspecified site of unspecified female breast: Secondary | ICD-10-CM | POA: Diagnosis not present

## 2021-05-03 DIAGNOSIS — N1831 Chronic kidney disease, stage 3a: Secondary | ICD-10-CM | POA: Diagnosis not present

## 2021-05-03 DIAGNOSIS — R531 Weakness: Secondary | ICD-10-CM | POA: Diagnosis not present

## 2021-05-03 DIAGNOSIS — Z89612 Acquired absence of left leg above knee: Secondary | ICD-10-CM | POA: Diagnosis not present

## 2021-05-03 DIAGNOSIS — Z79811 Long term (current) use of aromatase inhibitors: Secondary | ICD-10-CM | POA: Diagnosis not present

## 2021-05-03 DIAGNOSIS — D631 Anemia in chronic kidney disease: Secondary | ICD-10-CM | POA: Diagnosis not present

## 2021-05-03 DIAGNOSIS — M8580 Other specified disorders of bone density and structure, unspecified site: Secondary | ICD-10-CM | POA: Diagnosis not present

## 2021-05-03 DIAGNOSIS — I252 Old myocardial infarction: Secondary | ICD-10-CM | POA: Diagnosis not present

## 2021-05-03 LAB — CBC WITH DIFFERENTIAL/PLATELET
Abs Immature Granulocytes: 0.06 10*3/uL (ref 0.00–0.07)
Basophils Absolute: 0.1 10*3/uL (ref 0.0–0.1)
Basophils Relative: 1 %
Eosinophils Absolute: 0.3 10*3/uL (ref 0.0–0.5)
Eosinophils Relative: 5 %
HCT: 29.9 % — ABNORMAL LOW (ref 36.0–46.0)
Hemoglobin: 9.2 g/dL — ABNORMAL LOW (ref 12.0–15.0)
Immature Granulocytes: 1 %
Lymphocytes Relative: 18 %
Lymphs Abs: 1.1 10*3/uL (ref 0.7–4.0)
MCH: 31 pg (ref 26.0–34.0)
MCHC: 30.8 g/dL (ref 30.0–36.0)
MCV: 100.7 fL — ABNORMAL HIGH (ref 80.0–100.0)
Monocytes Absolute: 0.2 10*3/uL (ref 0.1–1.0)
Monocytes Relative: 4 %
Neutro Abs: 4.3 10*3/uL (ref 1.7–7.7)
Neutrophils Relative %: 71 %
Platelets: 280 10*3/uL (ref 150–400)
RBC: 2.97 MIL/uL — ABNORMAL LOW (ref 3.87–5.11)
RDW: 17 % — ABNORMAL HIGH (ref 11.5–15.5)
WBC: 6.1 10*3/uL (ref 4.0–10.5)
nRBC: 0 % (ref 0.0–0.2)

## 2021-05-03 LAB — COMPREHENSIVE METABOLIC PANEL
ALT: 19 U/L (ref 0–44)
AST: 28 U/L (ref 15–41)
Albumin: 3.1 g/dL — ABNORMAL LOW (ref 3.5–5.0)
Alkaline Phosphatase: 76 U/L (ref 38–126)
Anion gap: 9 (ref 5–15)
BUN: 22 mg/dL (ref 8–23)
CO2: 19 mmol/L — ABNORMAL LOW (ref 22–32)
Calcium: 7.6 mg/dL — ABNORMAL LOW (ref 8.9–10.3)
Chloride: 96 mmol/L — ABNORMAL LOW (ref 98–111)
Creatinine, Ser: 1.29 mg/dL — ABNORMAL HIGH (ref 0.44–1.00)
GFR, Estimated: 39 mL/min — ABNORMAL LOW (ref >60)
Glucose, Bld: 113 mg/dL — ABNORMAL HIGH (ref 70–99)
Potassium: 3.7 mmol/L (ref 3.5–5.1)
Sodium: 124 mmol/L — ABNORMAL LOW (ref 135–145)
Total Bilirubin: 0.6 mg/dL (ref 0.3–1.2)
Total Protein: 6.1 g/dL — ABNORMAL LOW (ref 6.5–8.1)

## 2021-05-03 MED ORDER — CALCIUM CARBONATE ANTACID 500 MG PO CHEW: 2.0000 | CHEWABLE_TABLET | Freq: Every day | ORAL | 0 refills | Status: AC

## 2021-05-03 NOTE — Progress Notes (Signed)
Patient here for oncology follow-up appointment, concerns of nausea and constipation

## 2021-05-03 NOTE — Progress Notes (Signed)
Hematology/Oncology  Follow up note [phone] [phone]   Patient Care Team: Pcp, No as PCP - General [name], NP as Nurse Practitioner (Hospice and Palliative Medicine) [name], MD as Consulting Physician (Oncology) De Hollingshead, RPH-CPP (Pharmacist) Minna Merritts, MD as Consulting Physician (Cardiology) Lauretta Grill, NP as Nurse Practitioner (Nurse Practitioner)  REFERRING PROVIDER: No ref. provider found  CHIEF COMPLAINTS/REASON FOR VISIT:  Follow up for metastatic breast cancer  HISTORY OF PRESENTING ILLNESS: Tiffany Bailey is a  87 y.o.  female with PMH listed below was seen in consultation at the request of  No ref. provider found  for evaluation of abnormal CT scan. Patient was recently evaluated by primary care provider for complaints of urinary retention, headache, constipation. 02/03/2019 x-ray of abdomen showed large colonic stool volume, nodular densities noted in the right mid lung are up). 02/08/2019 subsequent chest x-ray two-view showed hyperinflation of the lungs compatible with COPD.  Multiple nodular densities project over the mid and lower right lung. CT chest was obtained for further evaluation. 02/17/2019 CT chest without contrast Showed multiple bilateral pulmonary nodules,groundglass opacities. Largest solid nodule at the confluence of the right major and minor fissures measuring 1.6 cm.  Most notable groundglass nodule is of the right upper lobe and measure 1.8 x 0.8 cm, small right pleural effusion with associated atelectasis or consolidation.  Larger solid nodule are highly suspicious for metastatic disease. Bulky right mediastinal and hilar lymph node.  Largest pretracheal nodes measuring 3.5 x 3 cm.  There are additional bulky superior mediastinal, supraclavicular, left subpectoral lymph nodes.  Largest subpectoral nodes measuring 3.3 x 2.5 cm.  Patient was sent to cancer center for further  evaluation.  #Patient reports a history of left breast cancer status post mastectomy.  She is a poor historian.  She is not able to recall the timeframe of breast cancer diagnosis and then treatments.  Denies any chemotherapy or radiation treatments.  #Left lower extremity history of amputation, CKD, History of MI, Sickle cell disease, DM, HTN, anemia.   Patient denies shortness of breath, chest pain, hemoptysis.  Night sweating, abdominal pain. Reports intentional weight loss.  Appetite is poor.  Not eating much. Also have wax and wane, chronic intermittent headache, she takes Tylenol as needed, with some relief.  Denies any nausea vomiting. She sees neurology. 11/10/2018 CT head showed no acute intracranial abnormality or significant interval changes.  Stable atrophy and white matter disease.  Atherosclerosis.  #Patient had a consultation visit with me on 02/19/2019.  At that time he was referred for abnormal CT scan.  PET scan was obtained and I recommend patient to obtain left axillary lymph node biopsy.  Patient/family called back and decided not to proceed with any additional work-up.  #February 2020 started Arimidex # 03/30/2021 - 03/31/2021 Admission due to AKI, felt to be due to dehydration, side effects of Jardiance.   INTERVAL HISTORY: Tiffany Bailey is a 87 y.o. female with above history who returns to clinic for labs follow-up. She has started abemaciclib 150 mg twice daily. Receiving fulvestrant.  She started her chemo pill and says that her weakness is improved.  She feels better.  Appetite is good and she denies weight loss.  She is now able to stand unassisted.  Denies abdominal pain, diarrhea, recent infections, fevers, chills.  No fatigue or headache.  Says she is eating and drinking well.  Trying to increase hydration.   Review of Systems  Constitutional:  Positive for  fatigue. Negative for appetite change, chills, fever and unexpected weight change.  HENT:   Negative for hearing  loss and voice change.   Eyes:  Negative for eye problems.  Respiratory:  Negative for chest tightness, cough and shortness of breath.   Cardiovascular:  Negative for chest pain.  Gastrointestinal:  Negative for abdominal distention, abdominal pain, blood in stool and constipation.  Endocrine: Negative for hot flashes.  Genitourinary:  Negative for difficulty urinating and frequency.   Musculoskeletal:  Negative for arthralgias.       Left lower extremity history of amputation, intermittent sharp pain  Skin:  Negative for itching and rash.  Neurological:  Negative for extremity weakness and headaches.  Hematological:  Negative for adenopathy.  Psychiatric/Behavioral:  Negative for confusion.    MEDICAL HISTORY:  Past Medical History:  Diagnosis Date   Anemia    Anginal pain ([place])    Arthritis    Breast cancer ([place]) 07/19/2019   CKD (chronic kidney disease), stage III ([place])    Coronary artery disease    a. 2002 CABG x 3 (LIMA-LAD, SVG-D1, SVG-RCA); b. s/p multiple PCI's.   Diabetes mellitus without complication ([place])    Diastolic dysfunction    a. 12/2017 Echo: EF 55-60%, no rwma, Gr1 DD. Triv AI. Mild-mod MR/TR. PASP 66mHg.   Edema    Failure to thrive in adult    GERD (gastroesophageal reflux disease)    [place]SS disease [place] crisis [place] 10/23/2017   History of breast cancer    HOH (hard of hearing)    Hyperlipidemia    Hypertension    Myocardial infarction ([facility]    PAD (peripheral artery disease) ([place]    a. s/p L AKA; b. 12/2018 PTA R peroneal & PTA/Stenting R SFA.   Palpitations     SURGICAL HISTORY: Past Surgical History:  Procedure Laterality Date   ABDOMINAL HYSTERECTOMY     ABOVE KNEE LEG AMPUTATION Left 2013   ANTERIOR VITRECTOMY Left 11/16/2015   Procedure: ANTERIOR VITRECTOMY;  Surgeon: BEulogio Bear MD;  Location: [facility] ORS;  Service: Ophthalmology;  Laterality: Left;   BLADDER SURGERY     CATARACT EXTRACTION W/PHACO Left 11/16/2015   Procedure: CATARACT  EXTRACTION PHACO AND INTRAOCULAR LENS PLACEMENT (IOC);  Surgeon: BEulogio Bear MD;  Location: [facility] ORS;  Service: Ophthalmology;  Laterality: Left;  Lot # [id number] [place] 01:22.8 AP%:12.6 CDE: 10.46   CATARACT EXTRACTION W/PHACO Right 08/22/2016   Procedure: CATARACT EXTRACTION PHACO AND INTRAOCULAR LENS PLACEMENT (IOC);  Surgeon: BEulogio Bear MD;  Location: [facility] ORS;  Service: Ophthalmology;  Laterality: Right;  Lot # [id number] [place] 00:52.1 AP%:8.7 CDE: 4.99    CORONARY ANGIOPLASTY     STENT   CORONARY ARTERY BYPASS GRAFT     LOWER EXTREMITY ANGIOGRAPHY Right 01/14/2019   Procedure: LOWER EXTREMITY ANGIOGRAPHY;  Surgeon: DAlgernon Huxley MD;  [facility];  Service: Cardiovascular;  Laterality: Right;   MASTECTOMY     TUMOR REMOVAL     ABDOMINAL    SOCIAL HISTORY: Social History   Socioeconomic History   Marital status: Divorced    Spouse name: Not on file   Number of children: 7   Years of education: Not on file   Highest education level: Not on file  Occupational History   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  Tobacco Use   Smoking status: Never   Smokeless tobacco: Never  Substance and Sexual Activity   Alcohol use: No   Drug  use: No   Sexual activity: Not Currently  Other Topics Concern   Not on file  Social History Narrative   Not on file   Social Determinants of Health   Financial Resource Strain: Low Risk    Difficulty of Paying Living Expenses: Not hard at all  Food Insecurity: Not on file  Transportation Needs: Not on file  Physical Activity: Not on file  Stress: Not on file  Social Connections: Not on file  Intimate Partner Violence: Not on file    FAMILY HISTORY: Family History  Problem Relation Age of Onset   Sudden death Mother    Arthritis Father    Stroke Father    Breast cancer Sister    Diabetes Grandchild     ALLERGIES:  is allergic to amoxicillin, gabapentin, mirtazapine, and other.  MEDICATIONS:   Current Outpatient Medications  Medication Sig Dispense Refill   amLODipine (NORVASC) 10 MG tablet TAKE 1 TABLET BY MOUTH  DAILY 30 tablet 0   aspirin EC 81 MG tablet Take 1 tablet (81 mg total) by mouth daily. 30 tablet 5   atorvastatin (LIPITOR) 40 MG tablet TAKE 1 TABLET BY MOUTH  DAILY 90 tablet 3   blood glucose meter kit and supplies KIT Dispense based on patient and insurance preference. Use up to four times daily as directed. (FOR ICD-9 250.00, 250.01). 1 each 0   cloNIDine (CATAPRES) 0.1 MG tablet Take 0.1 mg by mouth 2 (two) times daily.     clopidogrel (PLAVIX) 75 MG tablet TAKE 1 TABLET BY MOUTH  DAILY 30 tablet 0   feeding supplement (ENSURE ENLIVE / ENSURE PLUS) LIQD Take 237 mLs by mouth 2 (two) times daily between meals.     ferrous gluconate (FERGON) 324 MG tablet Take 324 mg by mouth 2 (two) times daily.     glucose blood (ONE TOUCH TEST STRIPS) test strip Use to test blood sugar twice daily 200 each 3   hydrALAZINE (APRESOLINE) 10 MG tablet Take 1 tablet (10 mg total) by mouth in the morning and at bedtime. 180 tablet 3   insulin degludec (TRESIBA FLEXTOUCH) 100 UNIT/ML FlexTouch Pen Hold until followup with your outpatient doctor, since you are not eating well, and your blood sugars were low in the hospital even without insulin. (Patient taking differently: 6 Units. Hold until followup with your outpatient doctor, since you are not eating well, and your blood sugars were low in the hospital even without insulin.) 6 mL 1   Insulin Pen Needle (BD PEN NEEDLE NANO U/F) 32G X 4 MM MISC USE 1 DAILY 100 each 12   Metoprolol Tartrate 37.5 MG TABS Take 1 tablet by mouth 2 (two) times daily.     nitroGLYCERIN (NITROSTAT) 0.4 MG SL tablet Place 1 tablet (0.4 mg total) under the tongue every 5 (five) minutes as needed for chest pain. 25 tablet 4   ONE TOUCH LANCETS MISC Use as directed 2 times per day 200 each 3   valsartan (DIOVAN) 80 MG tablet Take by mouth.     vitamin C (ASCORBIC  ACID) 250 MG tablet Take 250 mg by mouth daily.     abemaciclib (VERZENIO) 150 MG tablet Take 1 tablet (150 mg total) by mouth 2 (two) times daily. Swallow tablets whole. Do not chew, crush, or split tablets before swallowing. (Patient not taking: Reported on 04/12/2021) 56 tablet 0   No current facility-administered medications for this visit.     PHYSICAL EXAMINATION: ECOG PERFORMANCE STATUS: 2 - Symptomatic, <  50% confined to bed Vitals:   05/03/21 1026  BP: (!) 167/55  Pulse: 65  Resp: 17  Temp: (!) 96.4 F (35.8 C)  SpO2: 100%   Filed Weights   05/03/21 1026  Weight: 90 lb (40.8 kg)    Physical Exam Constitutional:      General: She is not in acute distress.    Appearance: She is well-developed.     Comments: Frail. In wheelchair.   Eyes:     General: No scleral icterus. Cardiovascular:     Rate and Rhythm: Normal rate and regular rhythm.  Pulmonary:     Effort: Pulmonary effort is normal.     Comments: Decreased air entry LLL Musculoskeletal:        General: Deformity (amputation of LLE) present. No tenderness.     Right lower leg: No edema.     Left lower leg: No edema.  Skin:    General: Skin is warm and dry.     Coloration: Skin is not pale.  Neurological:     General: No focal deficit present.     Mental Status: She is alert and oriented to person, place, and time.     Motor: No weakness.  Psychiatric:        Mood and Affect: Mood normal.        Behavior: Behavior normal.     LABORATORY DATA:  I have reviewed the data as listed Lab Results  Component Value Date   WBC 6.1 05/03/2021   HGB 9.2 (L) 05/03/2021   HCT 29.9 (L) 05/03/2021   MCV 100.7 (H) 05/03/2021   PLT 280 05/03/2021   Recent Labs    03/30/21 1650 03/31/21 0428 04/12/21 0953 04/26/21 1003 05/03/21 1005  NA  --    < > 138  135 137 124*  K  --    < > 3.4*  3.2* 4.1 3.7  CL  --    < > 104  103 103 96*  CO2  --    < > _0 19*  GLUCOSE  --    < > 114*  109* 219* 113*   BUN  --    < > _1 CREATININE  --    < > 1.20*  1.19* 1.15* 1.29*  CALCIUM  --    < > 9.0  8.7* 8.4* 7.6*  GFRNONAA  --    < > 43*  43* 45* 39*  PROT 6.5   < > 6.5 6.1* 6.1*  ALBUMIN 3.1*   < > 3.3*  3.3* 3.3* 3.1*  AST 165*   < > 39 38 28  ALT 167*   < > 41 28 19  ALKPHOS 89   < > 82 80 76  BILITOT 0.7   < > 0.8 0.5 0.6  BILIDIR 0.1  --   --   --   --   IBILI 0.6  --   --   --   --    < > = values in this interval not displayed.    Iron/TIBC/Ferritin/ %Sat    Component Value Date/Time   IRON 33 01/12/2021 1133   TIBC 220 (L) 01/12/2021 1133   FERRITIN 74 01/12/2021 1133   IRONPCTSAT 15 01/12/2021 1133   IRONPCTSAT 27 06/19/2018 1006      RADIOGRAPHIC STUDIES: I have personally reviewed the radiological images as listed and agreed with the findings in the report. DG Chest 2 View  Result Date:  03/30/2021 CLINICAL DATA:  Weakness. EXAM: CHEST - 2 VIEW COMPARISON:  Chest x-ray 02/13/2021, CT chest 03/21/2020 chest x-ray 10/17/2017, CT chest 03/01/2021 FINDINGS: The heart and mediastinal contours are unchanged. Aortic calcification. Coronary artery stents. Hyperinflation of the lungs. Slightly less conspicuous patchy nodular like densities. No pulmonary edema. Blunting of bilateral costophrenic angles with trace bilateral pleural effusions not excluded. No pneumothorax. No acute osseous abnormality. IMPRESSION: Slightly less conspicuous patchy nodular like densities. Electronically Signed   By: Iven Finn M.D.   On: 03/30/2021 17:35   DG Chest 2 View  Result Date: 02/14/2021 CLINICAL DATA:  Persistent cough EXAM: CHEST - 2 VIEW COMPARISON:  CT from 03/21/2020 FINDINGS: Cardiac shadow is within normal limits. Postsurgical changes are noted. Lungs are well aerated bilaterally. No focal infiltrate is seen. Small right-sided pleural effusion is noted stable from the prior exam with minimal basilar atelectasis. Nodular changes are noted in the right mid lung and right  lung base increased from the prior CT examination. Repeat CT is recommended for follow-up. No bony abnormality is seen. IMPRESSION: Increase in nodular changes within the right lung when compare with the prior exam likely related to increasing metastatic disease. CT of the chest with contrast is recommended for further evaluation. These results will be called to the ordering clinician or representative by the Radiologist Assistant, and communication documented in the PACS or [employer]. Electronically Signed   By: [name] M.D.   On: 02/14/2021 09:56   CT CHEST ABDOMEN PELVIS WO CONTRAST  Result Date: 03/02/2021 CLINICAL DATA:  History of metastatic breast cancer in a 87 year old female EXAM: CT CHEST, ABDOMEN AND PELVIS WITHOUT CONTRAST TECHNIQUE: Multidetector CT imaging of the chest, abdomen and pelvis was performed following the standard protocol without IV contrast. COMPARISON:  October of 2021 FINDINGS: CT CHEST FINDINGS Cardiovascular: Calcified atheromatous plaque in the thoracic aorta. No aneurysmal dilation. Three-vessel coronary artery disease, abundant three-vessel disease. No substantial pericardial effusion. Normal caliber of central pulmonary vessels. Mediastinum/Nodes: Bulky precarinal/RIGHT paratracheal adenopathy 2.7 cm (image 23/7) previously approximately 1.5 cm. Bulky subcarinal lymphadenopathy (image 26/7) 2.5 cm, previously 1.9 cm. No thoracic inlet adenopathy, axillary adenopathy or internal mammary adenopathy. Smaller lymph nodes identified in the upper mediastinum on the previous study are unchanged. Lungs/Pleura: Enlarging RIGHT-sided effusion now moderate and layering posteriorly multiple pulmonary nodules. (Image 65/8) 1.9 cm greatest axial dimension, RIGHT upper lobe posterior pulmonary nodule with mixed attenuation measuring 1.9 cm previously. (Image 90/8) new 7 x 6 mm pulmonary nodule in the LEFT upper lobe along the pleural surface. (Image 97/8) nodule along an  accessory fissure in the RIGHT lower lobe measuring 12 x 9 mm previously 9 mm greatest dimension. (Image 84/8) 7 mm perifissural nodule previously 3 mm. Nodularity along the confluence of fissures in the RIGHT mid chest now with increased thickness at 6 mm greatest thickness, previously 4 mm. This spans 1.8 cm of the fissure previously 1.6 cm. Enlarging Peri fissural nodule (image 46/8) 7 mm previously 2-3 mm. Areas of ground-glass attenuation in the chest in the LEFT chest and RIGHT upper lobe show a similar appearance. Airways are patent. Basilar volume loss on the RIGHT. Musculoskeletal: See below for full musculoskeletal details. CT ABDOMEN PELVIS FINDINGS Hepatobiliary: Normal liver on noncontrast imaging. No pericholecystic stranding. Pancreas: Splenic atrophy without signs of adjacent inflammation. Spleen: Small spleen as before.  No contour abnormality. Adrenals/Urinary Tract: Adrenal glands are normal. Large RIGHT renal cyst is unchanged. Nephrolithiasis of the bilateral kidneys also similar previous imaging  and vascular calcifications as well. No hydronephrosis. No perinephric stranding. Cortical scarring mild-to-moderate in greatest on the RIGHT. Urinary bladder is moderately distended without signs of thickening or adjacent stranding. Stomach/Bowel: No acute gastrointestinal findings. Stomach is under distended limiting assessment. No small bowel dilation. Appendix not seen. No secondary signs to suggest appendiceal inflammation. Abundant stool throughout the colon with mild to moderate distension of the colon but without secondary signs of inflammation. Vascular/Lymphatic: Aortic atherosclerosis. No sign of aneurysm. Smooth contour of the IVC. There is no gastrohepatic or hepatoduodenal ligament lymphadenopathy. No retroperitoneal or mesenteric lymphadenopathy. No pelvic sidewall lymphadenopathy. Reproductive: No adnexal masses.  Post hysterectomy. Other: No ascites.  Small fat containing umbilical  hernia. Musculoskeletal: Spinal degenerative changes. 40% loss of height at the L5 level in this osteopenic patient. No priors for comparison of this location since September of 2020. No surrounding stranding. Mild retropulsion of posterior cortical elements without significant canal narrowing. IMPRESSION: Worsening of disease in the chest with enlarging mediastinal adenopathy and pulmonary metastatic disease. Mixed attenuation and ground-glass nodules without change. No evidence of metastatic disease involving the abdomen or pelvis. Enlarging RIGHT-sided pleural effusion now moderate. Abundant stool in the colon, query constipation. L5 compression fracture since available imaging. Correlate with any pain in this location. Findings are age indeterminate and associated with the proximally 40% loss of height. Bilateral nephrolithiasis and vascular calcifications. Aortic Atherosclerosis (ICD10-I70.0). Electronically Signed   By: Zetta Bills M.D.   On: 03/02/2021 11:25      ASSESSMENT & PLAN:  No diagnosis found.  Metastatic breast cancer-ER positive, PR negative HER2/neu negative.  She has started abemaciclib 150 mg twice daily. Received fulvestrant injection last week.  Tolerating well.  Generally feels better.  Continue treatment Weakness-improved.  Patient can now stand without assistance.  Weight is stable. Pleural effusion-moderate.  Minimal to no symptoms.  Monitor. Uncontrolled diabetes-follow-up with PCP. Dehydration- encouraged oral hydration. Creatinine 1.29 today. GFR within baseline.  Hypocalcemia-corrected calcium 8.32. Start oral calcium 1000 mg daily.   Fulvestrant injection on 11/28 Follow-up lab MD 4 weeks.  12/8  All questions were answered. The patient knows to call the clinic with any problems questions or concerns.  Beckey Rutter, DNP, AGNP-C [place] [facility] [phone] (clinic) 05/03/2021

## 2021-05-03 NOTE — Progress Notes (Signed)
Tiffany Bailey  Telephone:([phone] [phone]  Patient Care Team: Pcp, No as PCP - General [name], NP as Nurse Practitioner (Hospice and Palliative Medicine) [name], MD as Consulting Physician (Oncology) De Hollingshead, RPH-CPP (Pharmacist) Minna Merritts, MD as Consulting Physician (Cardiology) Lauretta Grill, NP as Nurse Practitioner (Nurse Practitioner)   Name of the patient: Tiffany Bailey  [id number]  1928/01/05   Date of visit: 05/03/21  HPI: Patient is a 87 y.o. female with progressive metastatic breast cancer, ER+, PR- and HER2-. Currently treated with Verzenio (abemaciclib) and fulvestrant. She started treatment on 04/26/21  Reason for Consult: Oral chemotherapy follow-up for abemaciclib therapy.   PAST MEDICAL HISTORY: Past Medical History:  Diagnosis Date   Anemia    Anginal pain ([place])    Arthritis    Breast cancer ([place]) 07/19/2019   CKD (chronic kidney disease), stage III ([place])    Coronary artery disease    a. 2002 CABG x 3 (LIMA-LAD, SVG-D1, SVG-RCA); b. s/p multiple PCI's.   Diabetes mellitus without complication ([place])    Diastolic dysfunction    a. 12/2017 Echo: EF 55-60%, no rwma, Gr1 DD. Triv AI. Mild-mod MR/TR. PASP 42mHg.   Edema    Failure to thrive in adult    GERD (gastroesophageal reflux disease)    Hb-SS disease without crisis ([facility] 10/23/2017   History of breast cancer    HOH (hard of hearing)    Hyperlipidemia    Hypertension    Myocardial infarction ([employer]    PAD (peripheral artery disease) ([facility]    a. s/p L AKA; b. 12/2018 PTA R peroneal & PTA/Stenting R SFA.   Palpitations     HEMATOLOGY/ONCOLOGY HISTORY:  Oncology History  [place]  07[place] Initial Diagnosis   Breast cancer ([facility]   07/26/2019 - 07/26/2019 Chemotherapy   The patient had anastrozole (ARIMIDEX) 1 MG tablet, 1 mg, Oral, Daily, 0 of 1 cycle, Start date: --, End date:  -- abemaciclib (VERZENIO) 150 MG tablet, 150 mg, Oral, 2 times daily, 0 of 1 cycle, Start date: --, End date: --   for chemotherapy treatment.       ALLERGIES:  is allergic to amoxicillin, gabapentin, mirtazapine, and other.  MEDICATIONS:  Current Outpatient Medications  Medication Sig Dispense Refill   abemaciclib (VERZENIO) 150 MG tablet Take 1 tablet (150 mg total) by mouth 2 (two) times daily. Swallow tablets whole. Do not chew, crush, or split tablets before swallowing. (Patient not taking: Reported on 04/12/2021) 56 tablet 0   amLODipine (NORVASC) 10 MG tablet TAKE 1 TABLET BY MOUTH  DAILY 30 tablet 0   aspirin EC 81 MG tablet Take 1 tablet (81 mg total) by mouth daily. 30 tablet 5   atorvastatin (LIPITOR) 40 MG tablet TAKE 1 TABLET BY MOUTH  DAILY 90 tablet 3   blood glucose meter kit and supplies KIT Dispense based on patient and insurance preference. Use up to four times daily as directed. (FOR ICD-9 250.00, 250.01). 1 each 0   calcium carbonate (TUMS) 500 MG chewable tablet Chew 2 tablets (400 mg of elemental calcium total) by mouth daily. 60 tablet 0   cloNIDine (CATAPRES) 0.1 MG tablet Take 0.1 mg by mouth 2 (two) times daily.     clopidogrel (PLAVIX) 75 MG tablet TAKE 1 TABLET BY MOUTH  DAILY 30 tablet 0   feeding supplement (ENSURE ENLIVE / ENSURE PLUS) LIQD Take 237 mLs by mouth 2 (two) times daily between  meals.     ferrous gluconate (FERGON) 324 MG tablet Take 324 mg by mouth 2 (two) times daily.     glucose blood (ONE TOUCH TEST STRIPS) test strip Use to test blood sugar twice daily 200 each 3   hydrALAZINE (APRESOLINE) 10 MG tablet Take 1 tablet (10 mg total) by mouth in the morning and at bedtime. 180 tablet 3   insulin degludec (TRESIBA FLEXTOUCH) 100 UNIT/ML FlexTouch Pen Hold until followup with your outpatient doctor, since you are not eating well, and your blood sugars were low in the hospital even without insulin. (Patient taking differently: 6 Units. Hold until  followup with your outpatient doctor, since you are not eating well, and your blood sugars were low in the hospital even without insulin.) 6 mL 1   Insulin Pen Needle (BD PEN NEEDLE NANO U/F) 32G X 4 MM MISC USE 1 DAILY 100 each 12   Metoprolol Tartrate 37.5 MG TABS Take 1 tablet by mouth 2 (two) times daily.     nitroGLYCERIN (NITROSTAT) 0.4 MG SL tablet Place 1 tablet (0.4 mg total) under the tongue every 5 (five) minutes as needed for chest pain. 25 tablet 4   ONE TOUCH LANCETS MISC Use as directed 2 times per day 200 each 3   valsartan (DIOVAN) 80 MG tablet Take by mouth.     vitamin C (ASCORBIC ACID) 250 MG tablet Take 250 mg by mouth daily.     No current facility-administered medications for this visit.    VITAL SIGNS: There were no vitals taken for this visit. There were no vitals filed for this visit.  Estimated body mass index is 17.58 kg/m as calculated from the following:   Height as of 04/04/21: 5' (1.524 m).   Weight as of an earlier encounter on 05/03/21: 40.8 kg (90 lb).  LABS: CBC:    Component Value Date/Time   WBC 6.1 05/03/2021 1005   HGB 9.2 (L) 05/03/2021 1005   HCT 29.9 (L) 05/03/2021 1005   PLT 280 05/03/2021 1005   MCV 100.7 (H) 05/03/2021 1005   NEUTROABS 4.3 05/03/2021 1005   LYMPHSABS 1.1 05/03/2021 1005   MONOABS 0.2 05/03/2021 1005   EOSABS 0.3 05/03/2021 1005   BASOSABS 0.1 05/03/2021 1005   Comprehensive Metabolic Panel:    Component Value Date/Time   NA 124 (L) 05/03/2021 1005   K 3.7 05/03/2021 1005   CL 96 (L) 05/03/2021 1005   CO2 19 (L) 05/03/2021 1005   BUN 22 05/03/2021 1005   CREATININE 1.29 (H) 05/03/2021 1005   CREATININE 1.18 (H) 06/19/2018 1006   GLUCOSE 113 (H) 05/03/2021 1005   CALCIUM 7.6 (L) 05/03/2021 1005   AST 28 05/03/2021 1005   ALT 19 05/03/2021 1005   ALKPHOS 76 05/03/2021 1005   BILITOT 0.6 05/03/2021 1005   PROT 6.1 (L) 05/03/2021 1005   ALBUMIN 3.1 (L) 05/03/2021 1005     Present during today's visit:  patient only  Assessment and Plan: Continue abemecicilib 148m twice daily   Oral Chemotherapy Side Effect/Intolerance:  No reported diarrhea, fatigue, or rash. Overall patient reports feeling less fatigued and stronger over the last week. She can now stand for longer and put on on her clothes herself.   Oral Chemotherapy Adherence: reported missed one dose since starting last week No patient barriers to medication adherence identified.   New medications: non reported  Medication Access Issues: no issues, [facility] based on her  medication start date.  Patient expressed understanding and was in agreement with this plan. She also understands that She can call clinic at any time with any questions, concerns, or complaints.   Follow-up plan: RTC in 3 weeks  Thank you for allowing me to participate in the care of this very pleasant patient.   Time Total: 15 mins  Visit consisted of counseling and education on dealing with issues of symptom management in the setting of serious and potentially life-threatening illness.Greater than 50%  of this time was spent counseling and coordinating care related to the above assessment and plan.  Signed by: Darl Pikes, PharmD, BCPS, Salley Slaughter, CPP Hematology/Oncology Clinical Pharmacist Practitioner [facility]/DB/AP Oral [facility] [phone]  05/03/2021 3:24 PM

## 2021-05-04 DIAGNOSIS — E119 Type 2 diabetes mellitus without complications: Secondary | ICD-10-CM | POA: Diagnosis not present

## 2021-05-04 DIAGNOSIS — R636 Underweight: Secondary | ICD-10-CM | POA: Diagnosis not present

## 2021-05-08 ENCOUNTER — Other Ambulatory Visit: Payer: Medicare Other | Admitting: Primary Care

## 2021-05-08 ENCOUNTER — Other Ambulatory Visit: Payer: Self-pay

## 2021-05-08 ENCOUNTER — Telehealth: Payer: Self-pay | Admitting: Family

## 2021-05-08 DIAGNOSIS — C50919 Malignant neoplasm of unspecified site of unspecified female breast: Secondary | ICD-10-CM

## 2021-05-08 DIAGNOSIS — Z515 Encounter for palliative care: Secondary | ICD-10-CM

## 2021-05-08 DIAGNOSIS — E1140 Type 2 diabetes mellitus with diabetic neuropathy, unspecified: Secondary | ICD-10-CM | POA: Diagnosis not present

## 2021-05-08 NOTE — Telephone Encounter (Signed)
Patient is under palliative care , patient has been vomiting, Diarrhea, weak delirious , and loss appetite X 3 days , vomiting and diarrhea started last night , she is not drinking very little  has had only 8 OZ today, EMS came out and patient refused to go  ED. So , daughter says son has more influence is calling him to advise patient needs to go to ED.

## 2021-05-08 NOTE — Progress Notes (Signed)
[facility] Consult Note Telephone: [phone]  Fax: [phone]    Date of encounter: 05/08/21 2:35 PM PATIENT NAME: Tiffany Bailey [place] [address]   [phone] (home)  DOB: [birth year] MRN: [id number] PRIMARY CARE PROVIDER:    [name], FNP [address] [place],  [place] [address] [phone]  REFERRING PROVIDER:   [name], FNP [address] [place],  [place] [address] [phone]  RESPONSIBLE PARTY:    Contact Information     Name Relation Home Work Mobile   Ratliff,[name] F Daughter [phone]   monee, dembeck   [phone]   Dixon Boos Daughter   [phone]   Annamary Carolin Daughter   [phone]       I met face to face with patient and family in  home. Palliative Care was asked to follow this patient by consultation request of  Tiffany Bailey, [name], FNP to address advance care planning and complex medical decision making. This is a follow up visit.                                   ASSESSMENT AND PLAN / RECOMMENDATIONS:   Advance Care Planning/Goals of Care: Goals include to maximize quality of life and symptom management. Our advance care planning conversation included a discussion about:    Experiences with loved ones who have been seriously ill or have died  Exploration of personal, cultural or spiritual beliefs that might influence medical decisions  Exploration of goals of care in the event of a sudden injury or illness  Identification of a healthcare agent - Tiffany Bailey Review of an  advance directive document . CODE STATUS: Full code, family cannot find MOST form.  Symptom Management/Plan:  Patient discovered with headache, low glucose at 48 mg/ dl. She has had vomiting and diarrhea. Patient was not talking and mentation is not at her regular level.  Pt was complaining of head ache as well. Education provided to family to  monitor glucose, obtain glucagon and hold tresiba when she is not eating well. Needs review of all meds, especially glucose control. Last A1c was collected in 01/2021. BP at this visit 197/83. Unable to recall if amlodipine has been taken this am.  She has both 200 unit and 100 unit tresiba. Family is not assisting her with injections and patient is a poor historian. Disposed of the 200 unit pen. Needs to f/u with PCP to assess ongoing insulin needs.  Sugars remained low after orange juice and eating, called EMT to come and assess. Their glucose machine showed 90 mg/dl, cross checked with home machine which calculated 50 mg/ dl. Advised to purchase a new glucose meter. EMS identified a closer reading with additional meter found in home, but still asked family to get new one. Hold insulin and contact PCP for assessment.   Recommend pre packaging and oversight of medications.   Follow up Palliative Care Visit: Palliative care will continue to follow for complex medical decision making, advance care planning, and clarification of goals. Return 2-4 weeks or prn.  I spent 60 minutes providing this consultation. More than 50% of the time in this consultation was spent in counseling and care coordination.  PPS: 40%  HOSPICE ELIGIBILITY/DIAGNOSIS: TBD  Chief Complaint: hypoglycemia  HISTORY OF PRESENT ILLNESS:  Tiffany KIRCHOFF is a 87 y.o. year old female  with DM,  hypoglycemia, has had vomiting and diarrhea. I administered sugar and orange juice, family was coming in and provided patient with food. Education given RE glucose monitoring and glucagon. Patient has dementia and metastatic breast cancer processes as well.   History obtained from review of EMR, discussion with primary team, and interview with family, facility staff/caregiver and/or Tiffany Bailey.  I reviewed available labs, medications, imaging, studies and related documents from the EMR.  Records reviewed and summarized above.    ROS  General: NAD ENMT: denies dysphagia Cardiovascular: denies chest pain, denies DOE Pulmonary: denies cough, denies increased SOB Abdomen: endorses poor appetite, denies constipation, endorses diarrhea, endorses continence of bowel GU: denies dysuria, endorses continence of urine MSK:  endorses weakness,  no falls reported, L AKA Skin: denies rashes or wounds Neurological: endorses headache pain, denies insomnia Psych: Endorses flat mood Heme/lymph/immuno: denies bruises, abnormal bleeding  Physical Exam: Current and past weights: 90 lbs Constitutional: 197/83 HR 74 RR 18 General: frail appearing, thin EYES: anicteric sclera, lids intact, no discharge  ENMT: intact hearing, oral mucous membranes moist CV: S1S2, RRR, 2+ LE edema (R) Pulmonary: LCTA, no increased work of breathing, no cough, room air Abdomen: intake 50%, no ascites, bg 48 mg/ dl on home meter with symptoms, 90 after eating and on EMS meter. GU: deferred MSK: ++ sarcopenia, moves all extremities, non ambulatory, uses w/c Skin: warm and dry, no rashes or wounds on visible skin Neuro:  increased generalized weakness, + cognitive impairment Psych: non-anxious affect, A and O x 3 Hem/lymph/immuno: no widespread bruising  Outpatient Encounter Medications as of 05/08/2021  Medication Sig   abemaciclib (VERZENIO) 150 MG tablet Take 1 tablet (150 mg total) by mouth 2 (two) times daily. Swallow tablets whole. Do not chew, crush, or split tablets before swallowing. (Patient not taking: Reported on 04/12/2021)   amLODipine (NORVASC) 10 MG tablet TAKE 1 TABLET BY MOUTH  DAILY   aspirin EC 81 MG tablet Take 1 tablet (81 mg total) by mouth daily.   atorvastatin (LIPITOR) 40 MG tablet TAKE 1 TABLET BY MOUTH  DAILY   blood glucose meter kit and supplies KIT Dispense based on patient and insurance preference. Use up to four times daily as directed. (FOR ICD-9 250.00, 250.01).   calcium carbonate (TUMS) 500 MG chewable tablet  Chew 2 tablets (400 mg of elemental calcium total) by mouth daily.   cloNIDine (CATAPRES) 0.1 MG tablet Take 0.1 mg by mouth 2 (two) times daily.   clopidogrel (PLAVIX) 75 MG tablet TAKE 1 TABLET BY MOUTH  DAILY   feeding supplement (ENSURE ENLIVE / ENSURE PLUS) LIQD Take 237 mLs by mouth 2 (two) times daily between meals.   ferrous gluconate (FERGON) 324 MG tablet Take 324 mg by mouth 2 (two) times daily.   glucose blood (ONE TOUCH TEST STRIPS) test strip Use to test blood sugar twice daily   hydrALAZINE (APRESOLINE) 10 MG tablet Take 1 tablet (10 mg total) by mouth in the morning and at bedtime.   insulin degludec (TRESIBA FLEXTOUCH) 100 UNIT/ML FlexTouch Pen Hold until followup with your outpatient doctor, since you are not eating well, and your blood sugars were low in the hospital even without insulin. (Patient taking differently: 6 Units. Hold until followup with your outpatient doctor, since you are not eating well, and your blood sugars were low in the hospital even without insulin.)   Insulin Pen Needle (BD PEN NEEDLE NANO U/F) 32G X 4 MM MISC USE 1 DAILY   Metoprolol Tartrate   37.5 MG TABS Take 1 tablet by mouth 2 (two) times daily.   nitroGLYCERIN (NITROSTAT) 0.4 MG SL tablet Place 1 tablet (0.4 mg total) under the tongue every 5 (five) minutes as needed for chest pain.   ONE TOUCH LANCETS MISC Use as directed 2 times per day   valsartan (DIOVAN) 80 MG tablet Take by mouth.   vitamin C (ASCORBIC ACID) 250 MG tablet Take 250 mg by mouth daily.   No facility-administered encounter medications on file as of 05/08/2021.    Thank you for the opportunity to participate in the care of Ms. Lizarraga.  The palliative care team will continue to follow. Please call our office at [phone] if we can be of additional assistance.   Kathryn McKelvey Smith, NP DNP, AGPCNP-BC  COVID-19 PATIENT SCREENING TOOL Asked and negative response unless otherwise noted:   Have you had symptoms of covid,  tested positive or been in contact with someone with symptoms/positive test in the past 5-10 days?   

## 2021-05-09 ENCOUNTER — Telehealth: Payer: Self-pay | Admitting: Family

## 2021-05-09 ENCOUNTER — Telehealth: Payer: Self-pay

## 2021-05-09 ENCOUNTER — Ambulatory Visit (INDEPENDENT_AMBULATORY_CARE_PROVIDER_SITE_OTHER): Payer: Medicare Other | Admitting: Family

## 2021-05-09 ENCOUNTER — Other Ambulatory Visit: Payer: Self-pay

## 2021-05-09 VITALS — Ht 60.0 in | Wt 90.0 lb

## 2021-05-09 DIAGNOSIS — E1140 Type 2 diabetes mellitus with diabetic neuropathy, unspecified: Secondary | ICD-10-CM

## 2021-05-09 DIAGNOSIS — I10 Essential (primary) hypertension: Secondary | ICD-10-CM

## 2021-05-09 MED ORDER — GLUCOSE BLOOD VI STRP
ORAL_STRIP | 3 refills | Status: DC
Start: 2021-05-09 — End: 2021-06-15

## 2021-05-09 MED ORDER — ONETOUCH ULTRASOFT LANCETS MISC: 1.0000 | Freq: Two times a day (BID) | 12 refills | Status: DC

## 2021-05-09 NOTE — Telephone Encounter (Signed)
Call pt Reviewed note from palliative care and very concerned with blood sugar,  hypoglycemic episode in setting of poor appetite, not checking blood sugars.    Can patient speak by phone with me today at 12pm?

## 2021-05-09 NOTE — Telephone Encounter (Signed)
See encounter from today.

## 2021-05-09 NOTE — Progress Notes (Signed)
Verbal consent for services obtained from patient prior to services given to TELEPHONE visit:   Location of call:  provider at work patient at home  Names of all persons present for services: Tiffany Paris, NP and patient  Daughter , Tiffany Bailey, is on the phone; patient is primary historian  Follow up DM and Hypoglycemia Yesterday, she reports that she had felt confused which today has completely resolved. She felt shakey, since resolved . She is 'feeling a lot better today.'   She had one non bloody emesis of vomiting yesterday and one the day day prior. There was a lot of phelgm in 'my throat' which felt 'like choking me'. She notes sporadic cough since February, unchanged. She doesn't cough all the time. It comes and goes.   Endorses nasal congestion.No fever, abdominal pain, dysuria, sob, wheezing, trouble swallowing.    She has both tresiba 200 unit and 100unit tresiba but states tresiba 200 was discarded yesterday.   Family is not assisting with insulin injections.  She is planning on eating chicken and orka for lunch. She had bacon and biscuit and jelly for breakfast.  She drinks water 16 ounces per day. Appetite is erratic and she endorses skipping meals.   She has not taken tresiba 6 units today. She gave herself 6 units day prior and feels confident it was correct dose.She didn't have dinner the night before her hypoglycemic episode.   Eye sight is poor. 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   She is not on jardiance  HTN- compliant with amlodipine 10mg , clonidine 0.1 mg twice daily, hydralazine 10 mg twice daily, valsartan 80mg  ,  Metroprolol tartrate 37.5 mg twice daily. No cp.  She has bp machine and today 126/67.    Using pill pack at home which she prepares herself. She has no trouble with this and feel organized.    Yesterday palliative care , [name] visited patient.   Glucose meter patient had showed 50, whereas EMS showed 90 after orange juice.  [name] advised to purchase  Glucagon, new glucose meter, hold insulin  BP Readings from Last 3 Encounters:  05/03/21 (!) 167/55  04/26/21 (!) 178/65  04/12/21 (!) 171/70      A/P/next steps:  Problem List Items Addressed This Visit       Cardiovascular and Mediastinum   Essential hypertension - Primary    Chronic, historically labile. Well controlled today based on home reading. Continue amlodipine 10mg , clonidine 0.1 mg twice daily, hydralazine 10 mg twice daily, valsartan 80mg  ,  Metroprolol tartrate 37.5 mg twice daily      Relevant Orders   AMB Referral to [facility]     Endocrine   DM (diabetes mellitus) ([place])   Relevant Orders   AMB Referral to Community Care Coordinaton  Uncontrolled. Complicated by CKD, metastatic breast cancer, erratic /poor appetite.  Patient assured me today that although she has glaucoma, she is able to see the insulin and the numbers on the insulin pen  and administer the correct amount.  Prior she had been on 6 units of Tresiba.  At this time , due to the hypoglycemic episodes and glucometer that is not accurate,I have asked her to hold Tresiba completely.  She will pick up a sample glucometer with strips and lancets from our office today along with blood glucose charts for which she can fill in blood sugar readings.  Long discussion with daughter and patient in regards to safety around administering insulin and the importance of checking blood sugar  regularly.  Continue to be quite concerned around patient's fragility and risk of hypoglycemia.  I am more inclined to have blood sugars run high with an A1c of 9 as the risk of hypoglycemic episode is far to great.  Patient appears to be administering medications at home using a pill pack with organization.  Advised her to review medication sheet which I have printed today and to check this with medications at home to ensure the med list is accurate today. Granddaughter coming to our office to pick up glucometer, blood  glucose sheets, and AVS.  Placed a referral to our pharmacist for further assistance in managing patient's fragile diabetes.  Close follow-up.   I spent 20 min  discussing plan of care over the phone.

## 2021-05-09 NOTE — Assessment & Plan Note (Signed)
Chronic, historically labile. Well controlled today based on home reading. Continue amlodipine 10mg , clonidine 0.1 mg twice daily, hydralazine 10 mg twice daily, valsartan 80mg  ,  Metroprolol tartrate 37.5 mg twice daily

## 2021-05-09 NOTE — Telephone Encounter (Signed)
error 

## 2021-05-09 NOTE — Patient Instructions (Addendum)
HOLD ALL INSULIN AT THIS TIME.   We will discuss this after I receive your blood sugar charts in 2 weeks time. You may leave these at front desk for my review.   Certainly call me if blood sugars persistently in the 200s or greater than 300 since you are not on insulin at this time.   Start over the counter PLAIN mucinex for thick congestion. If congestion worsens or  you develop fever, please let me know right away.   We have provided you with new glucometer today .Please throw away your old one as inaccurate.   Please purchase Glucose tablets today to use when you feel blood sugar is  low or when less than 70.   Goal of fasting blood sugar for you is between 150-200.  If in this range, we are reaching our target a1c ( goal 8.5 to 9%)   Please check fasting blood sugar in the morning time DAILY.   You may also check if you feel like you are having a low episode or particularly high episode of blood sugar.   For now please also  check blood sugar after your largest meal.  You specifically do this TWO hours after largest meal with the goal of being less than 200.   If blood sugar is checked sooner than 2 hours after largest meal, and it will be  expected to be elevated. You must wait 2 hours.   Call [facility] clinic if: BG < 70 or > 300.   If you have any symptoms of low blood sugar ( sweating, shakiness, lightheaded, dizzy) that you notify me. If you have a low, please drink a glass of orange juice and recheck blood sugar every 5 minutes until you don't feel symptomatic AND blood sugar is above 80.   Please confirm this medication  list which printed IS ACCURATE with your pill box at home.   Hypoglycemia Hypoglycemia occurs when the level of sugar (glucose) in the blood is too low. Hypoglycemia can happen in people who have or do not have diabetes. It can develop quickly, and it can be a medical emergency. For most people, a blood glucose level below 70 mg/dL (3.9 mmol/L) is  considered hypoglycemia. Glucose is a type of sugar that provides the body's main source of energy. Certain hormones (insulin and glucagon) control the level of glucose in the blood. Insulin lowers blood glucose, and glucagon raises blood glucose. Hypoglycemia can result from having too much insulin in the bloodstream, or from not eating enough food that contains glucose. You may also have reactive hypoglycemia, which happens within 4 hours after eating a meal. What are the causes? Hypoglycemia occurs most often in people who have diabetes and may be caused by: Diabetes medicine. Not eating enough, or not eating often enough. Increased physical activity. Drinking alcohol on an empty stomach. If you do not have diabetes, hypoglycemia may be caused by: A tumor in the pancreas. Not eating enough, or not eating for long periods at a time (fasting). A severe infection or illness. Problems after having bariatric surgery. Organ failure, such as kidney or liver failure. Certain medicines. What increases the risk? Hypoglycemia is more likely to develop in people who: Have diabetes and take medicines to lower blood glucose. Abuse alcohol. Have a severe illness. What are the signs or symptoms? Symptoms vary depending on whether the condition is mild, moderate, or severe. Mild hypoglycemia Hunger. Sweating and feeling clammy. Dizziness or feeling light-headed. Sleepiness or restless  sleep. Nausea. Increased heart rate. Headache. Blurry vision. Mood changes, such as irritability or anxiety. Tingling or numbness around the mouth, lips, or tongue. Moderate hypoglycemia Confusion and poor judgment. Behavior changes. Weakness. Irregular heartbeat. A change in coordination. Severe hypoglycemia Severe hypoglycemia is a medical emergency. It can cause: Fainting. Seizures. Loss of consciousness (coma). Death. How is this diagnosed? Hypoglycemia is diagnosed with a blood test to measure your  blood glucose level. This blood test is done while you are having symptoms. Your health care provider may also do a physical exam and review your medical history. How is this treated? This condition can be treated by immediately eating or drinking something that contains sugar with 15 grams of fast-acting carbohydrate, such as: 4 oz (120 mL) of fruit juice. 4 oz (120 mL) of regular soda (not diet soda). Several pieces of hard candy. Check food labels to find out how many pieces to eat for 15 grams. 1 Tbsp (15 mL) of sugar or honey. 4 glucose tablets. 1 tube of glucose gel. Treating hypoglycemia if you have diabetes If you are alert and able to swallow safely, follow the 15:15 rule: Take 15 grams of a fast-acting carbohydrate. Talk with your health care provider about how much you should take. Options for getting 15 grams of fast-acting carbohydrate include: Glucose tablets (take 4 tablets). Several pieces of hard candy. Check food labels to find out how many pieces to eat for 15 grams. 4 oz (120 mL) of fruit juice. 4 oz (120 mL) of regular soda (not diet soda). 1 Tbsp (15 mL) of sugar or honey. 1 tube of glucose gel. Check your blood glucose 15 minutes after you take the carbohydrate. If the repeat blood glucose level is still at or below 70 mg/dL (3.9 mmol/L), take 15 grams of a carbohydrate again. If your blood glucose level does not increase above 70 mg/dL (3.9 mmol/L) after 3 tries, seek emergency medical care. After your blood glucose level returns to normal, eat a meal or a snack within 1 hour.  Treating severe hypoglycemia Severe hypoglycemia is when your blood glucose level is below 54 mg/dL (3 mmol/L). Severe hypoglycemia is a medical emergency. Get medical help right away. If you have severe hypoglycemia and you cannot eat or drink, you will need to be given glucagon. A family member or close friend should learn how to check your blood glucose and how to give you glucagon. Ask your  health care provider if you need to have an emergency glucagon kit available. Severe hypoglycemia may need to be treated in a hospital. The treatment may include getting glucose through an IV. You may also need treatment for the cause of your hypoglycemia. Follow these instructions at home: General instructions Take over-the-counter and prescription medicines only as told by your health care provider. Monitor your blood glucose as told by your health care provider. If you drink alcohol: Limit how much you have to: 0-1 drink a day for women who are not pregnant. 0-2 drinks a day for men. Know how much alcohol is in your drink. In the [place] one drink equals one 12 oz bottle of beer (355 mL), one 5 oz glass of wine (148 mL), or one 1 oz glass of hard liquor (44 mL). Be sure to eat food along with drinking alcohol. Be aware that alcohol is absorbed quickly and may have lingering effects that may result in hypoglycemia later. Be sure to do ongoing glucose monitoring. Keep all follow-up visits. This is important.  If you have diabetes: Always have a fast-acting carbohydrate (15 grams) option with you to treat low blood glucose. Follow your diabetes management plan as directed by your health care provider. Make sure you: Know the symptoms of hypoglycemia. It is important to treat it right away to prevent it from becoming severe. Check your blood glucose as often as told. Always check before and after exercise. Always check your blood glucose before you drive a motorized vehicle. Take your medicines as told. Follow your meal plan. Eat on time, and do not skip meals. Share your diabetes management plan with people in your workplace, school, and household. Carry a medical alert card or wear medical alert jewelry. Where to find more information American Diabetes Association: www.diabetes.org Contact a health care provider if: You have problems keeping your blood glucose in your target range. You  have frequent episodes of hypoglycemia. Get help right away if: You continue to have hypoglycemia symptoms after eating or drinking something that contains 15 grams of fast-acting carbohydrate, and you cannot get your blood glucose above 70 mg/dL (3.9 mmol/L) while following the 15:15 rule. Your blood glucose is below 54 mg/dL (3 mmol/L). You have a seizure. You faint. These symptoms may represent a serious problem that is an emergency. Do not wait to see if the symptoms will go away. Get medical help right away. Call your local emergency services (911 in the [place] Do not drive yourself to the hospital. Summary Hypoglycemia occurs when the level of sugar (glucose) in the blood is too low. Hypoglycemia can happen in people who have or do not have diabetes. It can develop quickly, and it can be a medical emergency. Make sure you know the symptoms of hypoglycemia and how to treat it. Always have a fast-acting carbohydrate option with you to treat low blood sugar. This information is not intended to replace advice given to you by your health care provider. Make sure you discuss any questions you have with your health care provider. Document Revised: 05/04/2020 Document Reviewed: 05/04/2020 Elsevier Patient Education  2022 [employer]. Preventing Hypoglycemia Hypoglycemia occurs when the level of sugar (glucose) in the blood is too low. Hypoglycemia can happen in people who do or do not have diabetes (diabetes mellitus). It can develop quickly, and it can be a medical emergency. For most people with diabetes, a blood glucose level below 70 mg/dL (3.9 mmol/L) is considered hypoglycemia. Glucose is a type of sugar that provides the body's main source of energy. Certain hormones (insulin and glucagon) control the level of glucose in the blood. Insulin lowers blood glucose, and glucagon increases blood glucose. Hypoglycemia can result from having too much insulin in the bloodstream, or from not eating  enough food that contains glucose. Your risk for hypoglycemia is higher: If you take insulin or diabetes medicines to help lower your blood glucose or to help your body make more insulin. If you skip or delay a meal or snack. If you are ill. During and after exercise. You can prevent hypoglycemia by working with your health care provider to adjust your meal plan as needed and by taking other precautions. How can hypoglycemia affect me? Mild symptoms Mild hypoglycemia may not cause any symptoms. If you do have symptoms, they may include: Hunger. Sweating and feeling clammy. Dizziness or feeling light-headed. Sleepiness or restless sleep. Nausea. Increased heart rate. Headache. Blurry vision. Mood changes, including irritability or anxiety. Tingling or numbness around the mouth, lips, or tongue. If mild hypoglycemia is not recognized  and treated, it can quickly become moderate or severe hypoglycemia. Moderate symptoms Moderate hypoglycemia can cause: Confusion and poor judgment. Behavior changes. Weakness. Irregular heartbeat. A change in coordination. Severe symptoms Severe hypoglycemia is a medical emergency. It can cause: Fainting. Seizures. Loss of consciousness (coma). Death. What nutrition changes can be made? Work with your health care provider or dietitian to make a healthy meal plan that is right for you. Follow your meal plan carefully. Eat meals at regular times. If recommended by your health care provider, have snacks between meals. Donot skip or delay meals or snacks. You can be at risk for hypoglycemia if you are not getting enough carbohydrates. What lifestyle changes can be made?  Work closely with your health care provider to manage your blood glucose. Make sure you know: Your goal blood glucose levels. How and when to check your blood glucose. The symptoms of hypoglycemia. It is important to treat hypoglycemia right away to keep it from becoming severe. Do  not drink alcohol on an empty stomach. When you are ill, check your blood glucose more often than usual. Make a sick day plan in advance with your health care provider. Follow this plan whenever you cannot eat or drink normally. Always check your blood glucose before, during, and after exercise. How is this treated? This condition can often be treated by immediately eating or drinking something that contains sugar with 15 grams of fast-acting carbohydrate, such as: 4 oz (120 mL) of fruit juice. 4 oz (120 mL) of regular soda (not diet soda). Several pieces of hard candy. Check food labels to find out how many pieces to eat for 15 grams. 1 Tbsp (15 mL) of sugar or honey. 4 glucose tablets. 1 tube of glucose gel. Treating hypoglycemia if you have diabetes If you are alert and able to swallow safely, follow the 15:15 rule: Take 15 grams of a fast-acting carbohydrate. Talk with your health care provider about how much you should take. Fast-acting options include: Glucose tablets (take 4 tablets). Several pieces of hard candy. Check food labels to find out how many pieces to eat for 15 grams. 4 oz (120 mL) of fruit juice. 4 oz (120 mL) of regular soda (not diet soda). 1 Tbsp (15 mL) of sugar or honey. 1 tube of glucose gel. Check your blood glucose 15 minutes after you take the carbohydrate. If the repeat blood glucose level is still at or below 70 mg/dL (3.9 mmol/L), take 15 grams of a carbohydrate again. If your blood glucose level does not increase above 70 mg/dL (3.9 mmol/L) after 3 tries, seek emergency medical care. After your blood glucose level returns to normal, eat a meal or a snack within 1 hour. Treating severe hypoglycemia Severe hypoglycemia is when your blood glucose level is below 54 mg/dL (3 mmol/L). Severe hypoglycemia is a medical emergency. Get medical help right away. If you have severe hypoglycemia and you cannot eat or drink, you may need glucagon. A family member or close  friend should learn how to check your blood glucose and how to give you glucagon. Ask your health care provider if you need to have an emergency glucagon kit available. Severe hypoglycemia may need to be treated in a hospital. The treatment may include getting glucose through an IV. You may also need treatment for the cause of your hypoglycemia. Where to find more information American Diabetes Association: www.diabetes.[facility] of Diabetes and Digestive and Kidney Diseases: [employer] Association of Diabetes Care & Education Specialists:  www.diabeteseducator.org Contact a health care provider if: You have problems keeping your blood glucose in your target range. You have frequent episodes of hypoglycemia. Get help right away if: You continue to have hypoglycemia symptoms after eating or drinking something containing glucose. Your blood glucose level is below 54 mg/dL (3 mmol/L). You faint. You have a seizure. These symptoms may represent a serious problem that is an emergency. Do not wait to see if the symptoms will go away. Get medical help right away. Call your local emergency services (911 in the [place] Do not drive yourself to the hospital. Summary Know the symptoms of hypoglycemia and when you are at risk for it, such as during exercise or when you are sick. Check your blood glucose often when you are at risk for hypoglycemia. Hypoglycemia can develop quickly, and it can be dangerous if it is not treated right away. If you have a history of severe hypoglycemia, make sure your family or a close friend knows how to use your glucagon kit. Make sure you know how to treat hypoglycemia. Keep a fast-acting carbohydrate option available when you may be at risk for hypoglycemia. This information is not intended to replace advice given to you by your health care provider. Make sure you discuss any questions you have with your health care provider. Document Revised: 05/04/2020  Document Reviewed: 05/04/2020 Elsevier Patient Education  2022 [employer].

## 2021-05-09 NOTE — Telephone Encounter (Signed)
Appointment has been scheduled.

## 2021-05-09 NOTE — Assessment & Plan Note (Signed)
Uncontrolled. Complicated by CKD, metastatic breast cancer, erratic /poor appetite.  Patient assured me today that although she has glaucoma, she is able to see the insulin and the numbers on the insulin pen  and administer the correct amount.  Prior she had been on 6 units of Tresiba.  At this time , due to the hypoglycemic episodes and glucometer that is not accurate,I have asked her to hold Tresiba completely.  She will pick up a sample glucometer with strips and lancets from our office today along with blood glucose charts for which she can fill in blood sugar readings.  Long discussion with daughter and patient in regards to safety around administering insulin and the importance of checking blood sugar regularly.  Continue to be quite concerned around patient's fragility and risk of hypoglycemia.  I am more inclined to have blood sugars run high with an A1c of 9 as the risk of hypoglycemic episode is far to great.  Patient appears to be administering medications at home using a pill pack with organization.  Advised her to review medication sheet which I have printed today and to check this with medications at home to ensure the med list is accurate today. Granddaughter coming to our office to pick up glucometer, blood glucose sheets, and AVS.  Placed a referral to our pharmacist for further assistance in managing patient's fragile diabetes.  Close follow-up.

## 2021-05-14 ENCOUNTER — Other Ambulatory Visit: Payer: Self-pay

## 2021-05-14 ENCOUNTER — Inpatient Hospital Stay: Payer: Medicare Other

## 2021-05-14 ENCOUNTER — Encounter: Payer: Self-pay | Admitting: Family

## 2021-05-14 DIAGNOSIS — Z79811 Long term (current) use of aromatase inhibitors: Secondary | ICD-10-CM | POA: Diagnosis not present

## 2021-05-14 DIAGNOSIS — D631 Anemia in chronic kidney disease: Secondary | ICD-10-CM | POA: Diagnosis not present

## 2021-05-14 DIAGNOSIS — E1122 Type 2 diabetes mellitus with diabetic chronic kidney disease: Secondary | ICD-10-CM | POA: Diagnosis not present

## 2021-05-14 DIAGNOSIS — M8580 Other specified disorders of bone density and structure, unspecified site: Secondary | ICD-10-CM | POA: Diagnosis not present

## 2021-05-14 DIAGNOSIS — Z89612 Acquired absence of left leg above knee: Secondary | ICD-10-CM | POA: Diagnosis not present

## 2021-05-14 DIAGNOSIS — N1831 Chronic kidney disease, stage 3a: Secondary | ICD-10-CM | POA: Diagnosis not present

## 2021-05-14 DIAGNOSIS — I129 Hypertensive chronic kidney disease with stage 1 through stage 4 chronic kidney disease, or unspecified chronic kidney disease: Secondary | ICD-10-CM | POA: Diagnosis not present

## 2021-05-14 DIAGNOSIS — C50919 Malignant neoplasm of unspecified site of unspecified female breast: Secondary | ICD-10-CM | POA: Diagnosis not present

## 2021-05-14 DIAGNOSIS — I252 Old myocardial infarction: Secondary | ICD-10-CM | POA: Diagnosis not present

## 2021-05-14 DIAGNOSIS — Z803 Family history of malignant neoplasm of breast: Secondary | ICD-10-CM | POA: Diagnosis not present

## 2021-05-14 MED ORDER — FULVESTRANT 250 MG/5ML IM SOSY
500.0000 mg | PREFILLED_SYRINGE | Freq: Once | INTRAMUSCULAR | Status: AC
  Administered 2021-05-14: 14:00:00 500 mg via INTRAMUSCULAR
  Filled 2021-05-14: qty 10

## 2021-05-15 ENCOUNTER — Other Ambulatory Visit: Payer: Self-pay | Admitting: Oncology

## 2021-05-15 ENCOUNTER — Other Ambulatory Visit (HOSPITAL_COMMUNITY): Payer: Self-pay

## 2021-05-15 DIAGNOSIS — C50919 Malignant neoplasm of unspecified site of unspecified female breast: Secondary | ICD-10-CM

## 2021-05-15 MED ORDER — ABEMACICLIB 150 MG PO TABS
150.0000 mg | ORAL_TABLET | Freq: Two times a day (BID) | ORAL | 0 refills | Status: DC
  Filled 2021-05-15: qty 56, 28d supply, fill #0

## 2021-05-15 NOTE — Telephone Encounter (Signed)
Daughter, Arletha Grippe, called in regards to pt. Daughter was advised of the mychart message sent to pt and stated she will be taking her to the [facility] ED

## 2021-05-16 ENCOUNTER — Other Ambulatory Visit (HOSPITAL_COMMUNITY): Payer: Self-pay

## 2021-05-16 ENCOUNTER — Encounter: Payer: Self-pay | Admitting: Oncology

## 2021-05-17 ENCOUNTER — Telehealth: Payer: Self-pay

## 2021-05-17 NOTE — Chronic Care Management (AMB) (Signed)
  Chronic Care Management   Note  05/17/2021 Name: Tiffany Bailey MRN: [id number] DOB: Apr 22, 1928  Tiffany Bailey is a 87 y.o. year old female who is a primary care patient of [name], FNP. I reached out to Tiffany Bailey by phone today in response to a referral sent by Tiffany Bailey PCP.  Tiffany Bailey was given information about Chronic Care Management services today including:  CCM service includes personalized support from designated clinical staff supervised by her physician, including individualized plan of care and coordination with other care providers 24/7 contact phone numbers for assistance for urgent and routine care needs. Service will only be billed when office clinical staff spend 20 minutes or more in a month to coordinate care. Only one practitioner may furnish and bill the service in a calendar month. The patient may stop CCM services at any time (effective at the end of the month) by phone call to the office staff. The patient is responsible for co-pay (up to 20% after annual deductible is met) if co-pay is required by the individual health plan.   Patient agreed to services and verbal consent obtained.   Follow up plan: Telephone appointment with care management team member scheduled for:05/23/2021  Noreene Larsson, [place], [place], [place] [address] Direct Dial: [phone] [email or web address] Website: [email or web address]

## 2021-05-17 NOTE — Progress Notes (Signed)
Pt has been scheduled.  °

## 2021-05-18 ENCOUNTER — Telehealth: Payer: Self-pay

## 2021-05-18 DIAGNOSIS — I10 Essential (primary) hypertension: Secondary | ICD-10-CM | POA: Diagnosis not present

## 2021-05-18 NOTE — Telephone Encounter (Signed)
Patients daughter has been informed.

## 2021-05-18 NOTE — Telephone Encounter (Signed)
Hello, she is still experiencing a complete loss of appetite and very weak. She was having persistent bouts of diarrhea and vomiting and her pharmacist said it was a side effect of the Verzenio. She has refused to go to the ER and has taken it upon herself to discontinue the Verzenio. This has been for a few days, maybe 3 or 4 and she said the diarrhea has stopped and her taste is coming back. I have reached out to Dr. Tasia Catchings about that medication but haven't gotten a response. [name]

## 2021-05-21 NOTE — Telephone Encounter (Signed)
Appt with Dr Tasia Catchings 05/24/21 noted

## 2021-05-21 NOTE — Telephone Encounter (Signed)
Called pt's daughter to follow-up. She states that her mother is doing better since stopping the medication. No further nausea, vomiting, or diarrhea. She sates that she does not feel that her mother needs to be seen today, and she will wait until her appointment on Thursday to discuss with Dr. Tasia Catchings.

## 2021-05-23 ENCOUNTER — Ambulatory Visit: Payer: Medicare Other | Admitting: Pharmacist

## 2021-05-23 DIAGNOSIS — I25118 Atherosclerotic heart disease of native coronary artery with other forms of angina pectoris: Secondary | ICD-10-CM

## 2021-05-23 DIAGNOSIS — I10 Essential (primary) hypertension: Secondary | ICD-10-CM

## 2021-05-23 DIAGNOSIS — E1140 Type 2 diabetes mellitus with diabetic neuropathy, unspecified: Secondary | ICD-10-CM

## 2021-05-23 MED ORDER — AMLODIPINE BESYLATE 10 MG PO TABS: 10.0000 mg | ORAL_TABLET | Freq: Every day | ORAL | 1 refills | Status: AC

## 2021-05-23 NOTE — Patient Instructions (Signed)
Visit Information  Following are the goals we discussed today:  Patient Goals/Self-Care Activities Over the next 90 days, patient will:  - take medications as prescribed focus on medication adherence by using pill box check glucose daily to twice daily, document, and provide at future appointments check blood pressure daily, document, and provide at future appointments        Plan: Telephone follow up appointment with care management team member scheduled for:  next week   [name], PharmD, Para [place], CPP Clinical Pharmacist [place] [facility] [phone]   Please call the care guide team at [phone] if you need to cancel or reschedule your appointment.   Patient verbalizes understanding of instructions provided today and agrees to view in [place].

## 2021-05-23 NOTE — Chronic Care Management (AMB) (Signed)
Chronic Care Management CCM Pharmacy Note  05/23/2021 Name:  Tiffany Bailey MRN:  [id number] DOB:  [birth year]  Summary: - Appetite returning. Does not report any blood sugar readings >200 today.  - Reports blood pressures are "high" and we realized they do not have amlodipine  Recommendations/Changes made from today's visit: - Amlodipine refilled under PCP.  - Recommend to continue to hold Tresiba at this time. Follow for results of appointment tomorrow  Subjective: Tiffany Bailey is an 87 y.o. year old female who is a primary patient of [name], FNP.  The CCM team was consulted for assistance with disease management and care coordination needs.    Engaged with patient's daughter, [name] by telephone for follow up visit for pharmacy case management and/or care coordination services.   Objective:  Medications Reviewed Today     Reviewed by De Hollingshead, RPH-CPP (Pharmacist) on 05/23/21 at 1549  Med List Status: <None>   Medication Order Taking? Sig Documenting Provider Last Dose Status Informant  abemaciclib (VERZENIO) 150 MG tablet [id number] No Take 1 tablet (150 mg total) by mouth 2 (two) times daily. Swallow tablets whole. Do not chew, crush, or split tablets before swallowing.  Patient not taking: Reported on 05/23/2021   [name], MD Not Taking Active   amLODipine (NORVASC) 10 MG tablet [id number] No TAKE 1 TABLET BY MOUTH  DAILY  Patient not taking: Reported on 05/23/2021   Minna Merritts, MD Not Taking Active Self  aspirin EC 81 MG tablet [id number] Yes Take 1 tablet (81 mg total) by mouth daily. [name], DO Taking Active Self  atorvastatin (LIPITOR) 40 MG tablet [id number] Yes TAKE 1 TABLET BY MOUTH  DAILY [name], FNP Taking Active   blood glucose meter kit and supplies KIT [id number] Yes Dispense based on patient and insurance preference. Use up to four times daily as directed. (FOR ICD-9 250.00, 250.01). [name], DO Taking Active Self   calcium carbonate (TUMS) 500 MG chewable tablet [id number] Yes Chew 2 tablets (400 mg of elemental calcium total) by mouth daily. Verlon Au, NP Taking Active   cloNIDine (CATAPRES) 0.1 MG tablet [id number] Yes Take 0.1 mg by mouth 2 (two) times daily. [provider] Taking Active   clopidogrel (PLAVIX) 75 MG tablet [id number] Yes TAKE 1 TABLET BY MOUTH  DAILY [name], [name], MD Taking Active Self  feeding supplement (ENSURE ENLIVE / ENSURE PLUS) LIQD [id number]  Take 237 mLs by mouth 2 (two) times daily between meals. [name], MD  Active   ferrous gluconate [facility]) 324 MG tablet [id number] Yes Take 324 mg by mouth 2 (two) times daily. [provider] Taking Active Self  glucose blood (ONE TOUCH TEST STRIPS) test strip [id number]  Use to test blood sugar twice daily [name], FNP  Active   hydrALAZINE (APRESOLINE) 10 MG tablet [id number] Yes Take 1 tablet (10 mg total) by mouth in the morning and at bedtime. Kathlen Mody, [name], PA-C Taking Active Self  Insulin Pen Needle (BD PEN NEEDLE NANO U/F) 32G X 4 MM MISC [id number] No USE 1 DAILY  Patient not taking: Reported on 05/23/2021   [name], FNP Not Taking Active Self  Lancets [facility] ULTRASOFT) lancets [id number] No 1 each by Other route 2 (two) times daily. 90 day supply for [employer].  Dx E11.9  Patient not taking: Reported on 05/23/2021   [name], FNP Not Taking Active   Metoprolol Tartrate  37.5 MG TABS [id number] Yes Take 1 tablet by mouth 2 (two) times daily. [provider] Taking Active Self  nitroGLYCERIN (NITROSTAT) 0.4 MG SL tablet [id number] No Place 1 tablet (0.4 mg total) under the tongue every 5 (five) minutes as needed for chest pain.  Patient not taking: Reported on 05/23/2021   Minna Merritts, MD Not Taking Active Self  valsartan (DIOVAN) 80 MG tablet [id number] Yes Take by mouth. [provider] Taking Active   vitamin C (ASCORBIC ACID) 250 MG  tablet [id number] Yes Take 250 mg by mouth daily. [provider] Taking Active Self  Med List Note Darl Pikes, RPH-CPP 03/19/21 1411): Verzenio filled at [facility]            Pertinent Labs:   Lab Results  Component Value Date   HGBA1C 8.6 (A) 02/13/2021   Lab Results  Component Value Date   CHOL 136 02/26/2019   HDL 46.50 02/26/2019   LDLCALC 76 02/26/2019   TRIG 63.0 02/26/2019   CHOLHDL 3 02/26/2019   Lab Results  Component Value Date   CREATININE 1.29 (H) 05/03/2021   BUN 22 05/03/2021   NA 124 (L) 05/03/2021   K 3.7 05/03/2021   CL 96 (L) 05/03/2021   CO2 19 (L) 05/03/2021    SDOH:  (Social Determinants of Health) assessments and interventions performed:  SDOH Interventions    Flowsheet Row Most Recent Value  SDOH Interventions   Financial Strain Interventions Intervention Not Indicated       CCM Care Plan  Review of patient past medical history, allergies, medications, health status, including review of consultants reports, laboratory and other test data, was performed as part of comprehensive evaluation and provision of chronic care management services.   Care Plan : Medication Management  Updates made by De Hollingshead, RPH-CPP since 05/23/2021 12:00 AM     Problem: Diabetes, HTN, CAD      Long-Range Goal: Disease Progression Prevention   Start Date: 02/22/2021  Recent Progress: On track  Priority: High  Note:   Current Barriers:  Unable to achieve control of diabetes   Pharmacist Clinical Goal(s):  Over the next 90 days, patient will achieve control of diabetes as evidenced by A1c  through collaboration with PharmD and provider.   Interventions: 1:1 collaboration with [name], FNP regarding development and update of comprehensive plan of care as evidenced by provider attestation and co-signature Inter-disciplinary care team collaboration (see longitudinal plan of care) Comprehensive medication  review performed; medication list updated in electronic medical record  Diabetes: Uncontrolled; current treatment: Tresiba 6 units daily - holding right now Hx Jardiance - off due to dehydration previously  Would avoid metformin due to fluctuating renal dysfunction, avoid GLP1 given concurrent GI upset Current glucose readings: reports one post prandial reading of 190, does not know any other readings Given uncertainty of long term plans and lack of blood sugar readings, continue current regimen at this time. Will follow for need to restart insulin therapy  Hypertension, CAD in the setting of CKD: Uncontrolled; current treatment: amlodipine 10 mg daily - though reports today that she is out of this medication, hydralazine 10 mg BID, clonidine 0.1 mg BID, metoprolol tartrate 37.5 mg BID;  [name] reports that her mother said her blood pressure was "high" this morning, but did not know a number Hx lisinopril, was previously stopped due to renal function Sending refill on amlodipine at this time  Hyperlipidemia and CAD: Appropriately managed; current treatment:  atorvastatin 40 mg daily Antiplatelet regimen: aspirin 81 mg daily, clopidogrel 75 mg daily Recommended to continue current regimen at this time along with collaboration with cardiology  Metastatic Breast Cancer: Managed by hem/onc; current regimen: stopped Verzenio due to GI upset, nausea. Follow up with cancer center tomorrow Iron deficiency anemia: ferrous gluconate 324 mg BID - though patient taking daily  Patient Goals/Self-Care Activities Over the next 90 days, patient will:  - take medications as prescribed focus on medication adherence by using pill box check glucose daily to twice daily, document, and provide at future appointments check blood pressure daily, document, and provide at future appointments      Plan: Telephone follow up appointment with care management team member scheduled for:  next week  [name],  PharmD, [place], [place] Clinical Pharmacist [employer] at [facility] [phone]

## 2021-05-24 ENCOUNTER — Inpatient Hospital Stay: Payer: Medicare Other | Attending: Oncology

## 2021-05-24 ENCOUNTER — Other Ambulatory Visit (HOSPITAL_COMMUNITY): Payer: Self-pay

## 2021-05-24 ENCOUNTER — Encounter: Payer: Self-pay | Admitting: Oncology

## 2021-05-24 ENCOUNTER — Other Ambulatory Visit: Payer: Self-pay

## 2021-05-24 ENCOUNTER — Inpatient Hospital Stay: Payer: Medicare Other

## 2021-05-24 ENCOUNTER — Inpatient Hospital Stay: Payer: Medicare Other | Admitting: Pharmacist

## 2021-05-24 ENCOUNTER — Inpatient Hospital Stay (HOSPITAL_BASED_OUTPATIENT_CLINIC_OR_DEPARTMENT_OTHER): Payer: Medicare Other | Admitting: Oncology

## 2021-05-24 ENCOUNTER — Other Ambulatory Visit: Payer: Self-pay | Admitting: Pharmacist

## 2021-05-24 VITALS — BP 140/54 | HR 59 | Temp 97.6°F | Resp 16 | Wt 95.5 lb

## 2021-05-24 DIAGNOSIS — Z803 Family history of malignant neoplasm of breast: Secondary | ICD-10-CM | POA: Diagnosis not present

## 2021-05-24 DIAGNOSIS — N1831 Chronic kidney disease, stage 3a: Secondary | ICD-10-CM | POA: Diagnosis not present

## 2021-05-24 DIAGNOSIS — M8580 Other specified disorders of bone density and structure, unspecified site: Secondary | ICD-10-CM | POA: Diagnosis not present

## 2021-05-24 DIAGNOSIS — D701 Agranulocytosis secondary to cancer chemotherapy: Secondary | ICD-10-CM | POA: Insufficient documentation

## 2021-05-24 DIAGNOSIS — D631 Anemia in chronic kidney disease: Secondary | ICD-10-CM | POA: Insufficient documentation

## 2021-05-24 DIAGNOSIS — Z79811 Long term (current) use of aromatase inhibitors: Secondary | ICD-10-CM | POA: Diagnosis not present

## 2021-05-24 DIAGNOSIS — J90 Pleural effusion, not elsewhere classified: Secondary | ICD-10-CM | POA: Diagnosis not present

## 2021-05-24 DIAGNOSIS — D571 Sickle-cell disease without crisis: Secondary | ICD-10-CM | POA: Insufficient documentation

## 2021-05-24 DIAGNOSIS — Z89612 Acquired absence of left leg above knee: Secondary | ICD-10-CM | POA: Diagnosis not present

## 2021-05-24 DIAGNOSIS — C50919 Malignant neoplasm of unspecified site of unspecified female breast: Secondary | ICD-10-CM | POA: Diagnosis not present

## 2021-05-24 DIAGNOSIS — D6481 Anemia due to antineoplastic chemotherapy: Secondary | ICD-10-CM | POA: Insufficient documentation

## 2021-05-24 DIAGNOSIS — I252 Old myocardial infarction: Secondary | ICD-10-CM | POA: Diagnosis not present

## 2021-05-24 DIAGNOSIS — T451X5A Adverse effect of antineoplastic and immunosuppressive drugs, initial encounter: Secondary | ICD-10-CM

## 2021-05-24 DIAGNOSIS — E1122 Type 2 diabetes mellitus with diabetic chronic kidney disease: Secondary | ICD-10-CM | POA: Insufficient documentation

## 2021-05-24 DIAGNOSIS — Z5111 Encounter for antineoplastic chemotherapy: Secondary | ICD-10-CM

## 2021-05-24 DIAGNOSIS — I129 Hypertensive chronic kidney disease with stage 1 through stage 4 chronic kidney disease, or unspecified chronic kidney disease: Secondary | ICD-10-CM | POA: Diagnosis not present

## 2021-05-24 LAB — CBC WITH DIFFERENTIAL/PLATELET
Abs Immature Granulocytes: 0.02 10*3/uL (ref 0.00–0.07)
Basophils Absolute: 0.1 10*3/uL (ref 0.0–0.1)
Basophils Relative: 2 %
Eosinophils Absolute: 0.1 10*3/uL (ref 0.0–0.5)
Eosinophils Relative: 2 %
HCT: 23.9 % — ABNORMAL LOW (ref 36.0–46.0)
Hemoglobin: 8 g/dL — ABNORMAL LOW (ref 12.0–15.0)
Immature Granulocytes: 1 %
Lymphocytes Relative: 40 %
Lymphs Abs: 1.3 10*3/uL (ref 0.7–4.0)
MCH: 33.5 pg (ref 26.0–34.0)
MCHC: 33.5 g/dL (ref 30.0–36.0)
MCV: 100 fL (ref 80.0–100.0)
Monocytes Absolute: 0.3 10*3/uL (ref 0.1–1.0)
Monocytes Relative: 10 %
Neutro Abs: 1.4 10*3/uL — ABNORMAL LOW (ref 1.7–7.7)
Neutrophils Relative %: 45 %
Platelets: 184 10*3/uL (ref 150–400)
RBC: 2.39 MIL/uL — ABNORMAL LOW (ref 3.87–5.11)
RDW: 16.3 % — ABNORMAL HIGH (ref 11.5–15.5)
WBC: 3.1 10*3/uL — ABNORMAL LOW (ref 4.0–10.5)
nRBC: 0 % (ref 0.0–0.2)

## 2021-05-24 LAB — COMPREHENSIVE METABOLIC PANEL
ALT: 21 U/L (ref 0–44)
AST: 41 U/L (ref 15–41)
Albumin: 3 g/dL — ABNORMAL LOW (ref 3.5–5.0)
Alkaline Phosphatase: 88 U/L (ref 38–126)
Anion gap: 11 (ref 5–15)
BUN: 14 mg/dL (ref 8–23)
CO2: 21 mmol/L — ABNORMAL LOW (ref 22–32)
Calcium: 8.5 mg/dL — ABNORMAL LOW (ref 8.9–10.3)
Chloride: 105 mmol/L (ref 98–111)
Creatinine, Ser: 1.36 mg/dL — ABNORMAL HIGH (ref 0.44–1.00)
GFR, Estimated: 37 mL/min — ABNORMAL LOW (ref >60)
Glucose, Bld: 217 mg/dL — ABNORMAL HIGH (ref 70–99)
Potassium: 3.8 mmol/L (ref 3.5–5.1)
Sodium: 137 mmol/L (ref 135–145)
Total Bilirubin: 0.7 mg/dL (ref 0.3–1.2)
Total Protein: 5.7 g/dL — ABNORMAL LOW (ref 6.5–8.1)

## 2021-05-24 MED ORDER — ABEMACICLIB 50 MG PO TABS
50.0000 mg | ORAL_TABLET | Freq: Two times a day (BID) | ORAL | 0 refills | Status: DC
  Filled 2021-05-24 – 2021-05-30 (×2): qty 56, 28d supply, fill #0

## 2021-05-24 MED ORDER — LOPERAMIDE HCL 2 MG PO CAPS: 2.0000 mg | ORAL_CAPSULE | ORAL | 1 refills | Status: DC

## 2021-05-24 MED ORDER — ONDANSETRON HCL 4 MG PO TABS: 4.0000 mg | ORAL_TABLET | Freq: Four times a day (QID) | ORAL | 1 refills | Status: DC | PRN

## 2021-05-24 NOTE — Progress Notes (Signed)
Pt and daughter in for follow up, reports being weak but much improved from 2 weeks ago.  Pt states verzenio caused severe diarrhea.

## 2021-05-24 NOTE — Progress Notes (Signed)
Nutrition Follow-up:  Patient with stage IV breaset cancer.  Patient on fulvestrant and verzenio (stopped due to diarrhea, poor appetite).    Met with patient and daughter.  Patient reports that appetite is better over the last 2 weeks since stopping verzenio.  Has been eating chicken, cabbage, green beans, eggs, peanut butter crackers.  Drinks ensure shake 1 a day (unsure what kind -mailed to her in the case).    Medications: reviewed  Labs: reviewed  Anthropometrics:   Weight 95 lb today  85 lb 10/27 (cancer center wt)   NUTRITION DIAGNOSIS: Underweight continues    INTERVENTION:  Reviewed ways to add calories and protein in diet. Handout provided again to patient and daughter Encouraged 350 calorie shake or higher to provide more calories in protein despite higher sugar content.   Would not recommend diet restrictions due to receiving cancer treatment and low BMI.      MONITORING, EVALUATION, GOAL: weight trends, intake   NEXT VISIT: Thursday, Jan 5th, phone call  Tiffany Bailey, [place], [place] Registered Dietitian [phone] (mobile)

## 2021-05-24 NOTE — Progress Notes (Signed)
Hematology/Oncology  Follow up note [phone] [phone]   Patient Care Team: [name], FNP as PCP - General (Family Medicine) Jason Coop, NP as Nurse Practitioner (Hospice and Palliative Medicine) [name], MD as Consulting Physician (Oncology) De Hollingshead, RPH-CPP (Pharmacist) Minna Merritts, MD as Consulting Physician (Cardiology) Lauretta Grill, NP as Nurse Practitioner (Nurse Practitioner) Borders, [name], NP as Nurse Practitioner (Hospice and Palliative Medicine)  REFERRING PROVIDER: [name], FNP  CHIEF COMPLAINTS/REASON FOR VISIT:  Follow up for metastatic breast cancer  HISTORY OF PRESENTING ILLNESS:   Tiffany Bailey is a  87 y.o.  female with PMH listed below was seen in consultation at the request of  [name], FNP  for evaluation of abnormal CT scan. Patient was recently evaluated by primary care provider for complaints of urinary retention, headache, constipation. 02/03/2019 x-ray of abdomen showed large colonic stool volume, nodular densities noted in the right mid lung are up). 02/08/2019 subsequent chest x-ray two-view showed hyperinflation of the lungs compatible with COPD.  Multiple nodular densities project over the mid and lower right lung. CT chest was obtained for further evaluation. 02/17/2019 CT chest without contrast Showed multiple bilateral pulmonary nodules,groundglass opacities. Largest solid nodule at the confluence of the right major and minor fissures measuring 1.6 cm.  Most notable groundglass nodule is of the right upper lobe and measure 1.8 x 0.8 cm, small right pleural effusion with associated atelectasis or consolidation.  Larger solid nodule are highly suspicious for metastatic disease. Bulky right mediastinal and hilar lymph node.  Largest pretracheal nodes measuring 3.5 x 3 cm.  There are additional bulky superior mediastinal, supraclavicular, left subpectoral lymph nodes.   Largest subpectoral nodes measuring 3.3 x 2.5 cm.  Patient was sent to cancer center for further evaluation.  #Patient reports a history of left breast cancer status post mastectomy.  She is a poor historian.  She is not able to recall the timeframe of breast cancer diagnosis and then treatments.  Denies any chemotherapy or radiation treatments.  #Left lower extremity history of amputation, CKD, History of MI, Sickle cell disease, DM, HTN, anemia.   Patient denies shortness of breath, chest pain, hemoptysis.  Night sweating, abdominal pain. Reports intentional weight loss.  Appetite is poor.  Not eating much. Also have wax and wane, chronic intermittent headache, she takes Tylenol as needed, with some relief.  Denies any nausea vomiting. She sees neurology. 11/10/2018 CT head showed no acute intracranial abnormality or significant interval changes.  Stable atrophy and white matter disease.  Atherosclerosis.  #Patient had a consultation visit with me on 02/19/2019.  At that time he was referred for abnormal CT scan.  PET scan was obtained and I recommend patient to obtain left axillary lymph node biopsy.  Patient/family called back and decided not to proceed with any additional work-up.  #February 2020 started Arimidex # 03/30/2021 - 03/31/2021 Admission due to AKI, felt to be due to dehydration, side effects of Jardiance.   INTERVAL HISTORY Tiffany Bailey is a 87 y.o. female who has above history reviewed by me today presents for follow up visit for management of metastatic breast cancer. Patient was accompanied by her daughter Patient was seen by Beckey Rutter on 05/03/2021, at that time, she has been on abemaciclib 125m BID for about one week. She tolerated well at that point, however later developed nausea and diarrhea.  She has taken otc anti diarrhea medication as advised by PCP and her symptoms  have improved.  She has gained 5 pounds. Abemaciclib has been held for 1 week.  Her glucose has  been low and diabetes regimen has been adjusted. [name] was held.  . Review of Systems  Constitutional:  Positive for fatigue. Negative for appetite change, chills, fever and unexpected weight change.  HENT:   Negative for hearing loss and voice change.   Eyes:  Negative for eye problems.  Respiratory:  Negative for chest tightness, cough and shortness of breath.   Cardiovascular:  Negative for chest pain.  Gastrointestinal:  Negative for abdominal distention, abdominal pain, blood in stool and constipation.  Endocrine: Negative for hot flashes.  Genitourinary:  Negative for difficulty urinating and frequency.   Musculoskeletal:  Negative for arthralgias.       Left lower extremity history of amputation  Skin:  Negative for itching and rash.  Neurological:  Negative for extremity weakness and headaches.  Hematological:  Negative for adenopathy.  Psychiatric/Behavioral:  Negative for confusion.    MEDICAL HISTORY:  Past Medical History:  Diagnosis Date   Anemia    Anginal pain ([place])    Arthritis    Breast cancer ([place]) 07/19/2019   CKD (chronic kidney disease), stage III ([place])    Coronary artery disease    a. 2002 CABG x 3 (LIMA-LAD, SVG-D1, SVG-RCA); b. s/p multiple PCI's.   Diabetes mellitus without complication ([place])    Diastolic dysfunction    a. 12/2017 Echo: EF 55-60%, no rwma, Gr1 DD. Triv AI. Mild-mod MR/TR. PASP 50mHg.   Edema    Failure to thrive in adult    GERD (gastroesophageal reflux disease)    [place] 10/23/2017   History of breast cancer    HOH (hard of hearing)    Hyperlipidemia    Hypertension    Myocardial infarction ([facility]    PAD (peripheral artery disease) ([place]    a. s/p L AKA; b. 12/2018 PTA R peroneal & PTA/Stenting R SFA.   Palpitations     SURGICAL HISTORY: Past Surgical History:  Procedure Laterality Date   ABDOMINAL HYSTERECTOMY     ABOVE KNEE LEG AMPUTATION Left 2013   ANTERIOR VITRECTOMY Left 11/16/2015    Procedure: ANTERIOR VITRECTOMY;  Surgeon: BEulogio Bear MD;  Location: [facility] ORS;  Service: Ophthalmology;  Laterality: Left;   BLADDER SURGERY     CATARACT EXTRACTION W/PHACO Left 11/16/2015   Procedure: CATARACT EXTRACTION PHACO AND INTRAOCULAR LENS PLACEMENT (IOC);  Surgeon: BEulogio Bear MD;  Location: [facility] ORS;  Service: Ophthalmology;  Laterality: Left;  Lot # [id number] [place] 01:22.8 AP%:12.6 CDE: 10.46   CATARACT EXTRACTION W/PHACO Right 08/22/2016   Procedure: CATARACT EXTRACTION PHACO AND INTRAOCULAR LENS PLACEMENT (IOC);  Surgeon: BEulogio Bear MD;  Location: [facility] ORS;  Service: Ophthalmology;  Laterality: Right;  Lot # [id number] [place] 00:52.1 AP%:8.7 CDE: 4.99    CORONARY ANGIOPLASTY     STENT   CORONARY ARTERY BYPASS GRAFT     LOWER EXTREMITY ANGIOGRAPHY Right 01/14/2019   Procedure: LOWER EXTREMITY ANGIOGRAPHY;  Surgeon: DAlgernon Huxley MD;  [facility];  Service: Cardiovascular;  Laterality: Right;   MASTECTOMY     TUMOR REMOVAL     ABDOMINAL    SOCIAL HISTORY: Social History   Socioeconomic History   Marital status: Divorced    Spouse name: Not on file   Number of children: 7   Years of education: Not on file   Highest education level: Not on file  Occupational History  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  Tobacco Use   Smoking status: Never   Smokeless tobacco: Never  Substance and Sexual Activity   Alcohol use: No   Drug use: No   Sexual activity: Not Currently  Other Topics Concern   Not on file  Social History Narrative   Not on file   Social Determinants of Health   Financial Resource Strain: Low Risk    Difficulty of Paying Living Expenses: Not hard at all  Food Insecurity: Not on file  Transportation Needs: Not on file  Physical Activity: Not on file  Stress: Not on file  Social Connections: Not on file  Intimate Partner Violence: Not on file    FAMILY HISTORY: Family History  Problem Relation Age of  Onset   Sudden death Mother    Arthritis Father    Stroke Father    Breast cancer Sister    Diabetes Grandchild     ALLERGIES:  is allergic to amoxicillin, gabapentin, mirtazapine, and other.  MEDICATIONS:  Current Outpatient Medications  Medication Sig Dispense Refill   amLODipine (NORVASC) 10 MG tablet Take 1 tablet (10 mg total) by mouth daily. 90 tablet 1   aspirin EC 81 MG tablet Take 1 tablet (81 mg total) by mouth daily. 30 tablet 5   atorvastatin (LIPITOR) 40 MG tablet TAKE 1 TABLET BY MOUTH  DAILY 90 tablet 3   blood glucose meter kit and supplies KIT Dispense based on patient and insurance preference. Use up to four times daily as directed. (FOR ICD-9 250.00, 250.01). 1 each 0   calcium carbonate (TUMS) 500 MG chewable tablet Chew 2 tablets (400 mg of elemental calcium total) by mouth daily. 60 tablet 0   cloNIDine (CATAPRES) 0.1 MG tablet Take 0.1 mg by mouth 2 (two) times daily.     clopidogrel (PLAVIX) 75 MG tablet TAKE 1 TABLET BY MOUTH  DAILY 30 tablet 0   feeding supplement (ENSURE ENLIVE / ENSURE PLUS) LIQD Take 237 mLs by mouth 2 (two) times daily between meals.     ferrous gluconate (FERGON) 324 MG tablet Take 324 mg by mouth 2 (two) times daily.     glucose blood (ONE TOUCH TEST STRIPS) test strip Use to test blood sugar twice daily 200 each 3   hydrALAZINE (APRESOLINE) 10 MG tablet Take 1 tablet (10 mg total) by mouth in the morning and at bedtime. 180 tablet 3   loperamide (IMODIUM) 2 MG capsule Take 1 capsule (2 mg total) by mouth See admin instructions. Initial: 4 mg, followed by 2 mg after each loose stool; maximum: 16 mg/day 60 capsule 1   Metoprolol Tartrate 37.5 MG TABS Take 1 tablet by mouth 2 (two) times daily.     ondansetron (ZOFRAN) 4 MG tablet Take 1 tablet (4 mg total) by mouth every 6 (six) hours as needed for nausea or vomiting. 60 tablet 1   valsartan (DIOVAN) 80 MG tablet Take by mouth.     vitamin C (ASCORBIC ACID) 250 MG tablet Take 250 mg by mouth  daily.     abemaciclib (VERZENIO) 50 MG tablet Take 1 tablet (50 mg total) by mouth 2 (two) times daily. 56 tablet 0   Insulin Pen Needle (BD PEN NEEDLE NANO U/F) 32G X 4 MM MISC USE 1 DAILY (Patient not taking: Reported on 05/24/2021) 100 each 12   Lancets (ONETOUCH ULTRASOFT) lancets 1 each by Other route 2 (two) times daily. 90 day supply for [employer].  Dx E11.9 (Patient not taking: Reported on 05/23/2021) 300 each 12   nitroGLYCERIN (NITROSTAT) 0.4 MG SL tablet Place 1 tablet (0.4 mg total) under the tongue every 5 (five) minutes as needed for chest pain. (Patient not taking: Reported on 05/23/2021) 25 tablet 4   No current facility-administered medications for this visit.     PHYSICAL EXAMINATION: ECOG PERFORMANCE STATUS: 2 - Symptomatic, <50% confined to bed Vitals:   05/24/21 1440  BP: (!) 140/54  Pulse: (!) 59  Resp: 16  Temp: 97.6 F (36.4 C)  SpO2: 99%   Filed Weights   05/24/21 1440  Weight: 95 lb 8 oz (43.3 kg)    Physical Exam Constitutional:      General: She is not in acute distress.    Comments: She sits in the wheel chair.  Frail appearance  HENT:     Head: Normocephalic and atraumatic.  Eyes:     General: No scleral icterus.    Pupils: Pupils are equal, round, and reactive to light.  Cardiovascular:     Rate and Rhythm: Normal rate and regular rhythm.     Heart sounds: Normal heart sounds.  Pulmonary:     Effort: Pulmonary effort is normal. No respiratory distress.     Breath sounds: No wheezing.     Comments: Decreased breath sound right lower lobe.  Good air entry on the left base.  Abdominal:     General: Bowel sounds are normal. There is no distension.     Palpations: Abdomen is soft.  Musculoskeletal:        General: No deformity. Normal range of motion.     Cervical back: Normal range of motion and neck supple.     Comments: Left lower extremity history of amputation.   Skin:    General: Skin is warm and dry.     Findings: No  erythema or rash.  Neurological:     Mental Status: She is alert and oriented to person, place, and time. Mental status is at baseline.     Cranial Nerves: No cranial nerve deficit.     Coordination: Coordination normal.  Psychiatric:        Mood and Affect: Mood normal.     LABORATORY DATA:  I have reviewed the data as listed Lab Results  Component Value Date   WBC 3.1 (L) 05/24/2021   HGB 8.0 (L) 05/24/2021   HCT 23.9 (L) 05/24/2021   MCV 100.0 05/24/2021   PLT 184 05/24/2021   Recent Labs    03/30/21 1650 03/31/21 0428 04/26/21 1003 05/03/21 1005 05/24/21 1414  NA  --    < > 137 124* 137  K  --    < > 4.1 3.7 3.8  CL  --    < > 103 96* 105  CO2  --    < > 27 19* 21*  GLUCOSE  --    < > 219* 113* 217*  BUN  --    < > 18 22 14   CREATININE  --    < > 1.15* 1.29* 1.36*  CALCIUM  --    < > 8.4* 7.6* 8.5*  GFRNONAA  --    < > 45* 39* 37*  PROT 6.5   < > 6.1* 6.1* 5.7*  ALBUMIN 3.1*   < > 3.3* 3.1* 3.0*  AST 165*   < > 38 28 41  ALT 167*   < > 28 19 21   ALKPHOS 89   < > 80 76  88  BILITOT 0.7   < > 0.5 0.6 0.7  BILIDIR 0.1  --   --   --   --   IBILI 0.6  --   --   --   --    < > = values in this interval not displayed.    Iron/TIBC/Ferritin/ %Sat    Component Value Date/Time   IRON 33 01/12/2021 1133   TIBC 220 (L) 01/12/2021 1133   FERRITIN 74 01/12/2021 1133   IRONPCTSAT 15 01/12/2021 1133   IRONPCTSAT 27 06/19/2018 1006      RADIOGRAPHIC STUDIES: I have personally reviewed the radiological images as listed and agreed with the findings in the report. DG Chest 2 View  Result Date: 03/30/2021 CLINICAL DATA:  Weakness. EXAM: CHEST - 2 VIEW COMPARISON:  Chest x-ray 02/13/2021, CT chest 03/21/2020 chest x-ray 10/17/2017, CT chest 03/01/2021 FINDINGS: The heart and mediastinal contours are unchanged. Aortic calcification. Coronary artery stents. Hyperinflation of the lungs. Slightly less conspicuous patchy nodular like densities. No pulmonary edema. Blunting of  bilateral costophrenic angles with trace bilateral pleural effusions not excluded. No pneumothorax. No acute osseous abnormality. IMPRESSION: Slightly less conspicuous patchy nodular like densities. Electronically Signed   By: Iven Finn M.D.   On: 03/30/2021 17:35   CT CHEST ABDOMEN PELVIS WO CONTRAST  Result Date: 03/02/2021 CLINICAL DATA:  History of metastatic breast cancer in a 87 year old female EXAM: CT CHEST, ABDOMEN AND PELVIS WITHOUT CONTRAST TECHNIQUE: Multidetector CT imaging of the chest, abdomen and pelvis was performed following the standard protocol without IV contrast. COMPARISON:  October of 2021 FINDINGS: CT CHEST FINDINGS Cardiovascular: Calcified atheromatous plaque in the thoracic aorta. No aneurysmal dilation. Three-vessel coronary artery disease, abundant three-vessel disease. No substantial pericardial effusion. Normal caliber of central pulmonary vessels. Mediastinum/Nodes: Bulky precarinal/RIGHT paratracheal adenopathy 2.7 cm (image 23/7) previously approximately 1.5 cm. Bulky subcarinal lymphadenopathy (image 26/7) 2.5 cm, previously 1.9 cm. No thoracic inlet adenopathy, axillary adenopathy or internal mammary adenopathy. Smaller lymph nodes identified in the upper mediastinum on the previous study are unchanged. Lungs/Pleura: Enlarging RIGHT-sided effusion now moderate and layering posteriorly multiple pulmonary nodules. (Image 65/8) 1.9 cm greatest axial dimension, RIGHT upper lobe posterior pulmonary nodule with mixed attenuation measuring 1.9 cm previously. (Image 90/8) new 7 x 6 mm pulmonary nodule in the LEFT upper lobe along the pleural surface. (Image 97/8) nodule along an accessory fissure in the RIGHT lower lobe measuring 12 x 9 mm previously 9 mm greatest dimension. (Image 84/8) 7 mm perifissural nodule previously 3 mm. Nodularity along the confluence of fissures in the RIGHT mid chest now with increased thickness at 6 mm greatest thickness, previously 4 mm. This spans  1.8 cm of the fissure previously 1.6 cm. Enlarging Peri fissural nodule (image 46/8) 7 mm previously 2-3 mm. Areas of ground-glass attenuation in the chest in the LEFT chest and RIGHT upper lobe show a similar appearance. Airways are patent. Basilar volume loss on the RIGHT. Musculoskeletal: See below for full musculoskeletal details. CT ABDOMEN PELVIS FINDINGS Hepatobiliary: Normal liver on noncontrast imaging. No pericholecystic stranding. Pancreas: Splenic atrophy without signs of adjacent inflammation. Spleen: Small spleen as before.  No contour abnormality. Adrenals/Urinary Tract: Adrenal glands are normal. Large RIGHT renal cyst is unchanged. Nephrolithiasis of the bilateral kidneys also similar previous imaging and vascular calcifications as well. No hydronephrosis. No perinephric stranding. Cortical scarring mild-to-moderate in greatest on the RIGHT. Urinary bladder is moderately distended without signs of thickening or adjacent stranding. Stomach/Bowel: No acute gastrointestinal findings. Stomach  is under distended limiting assessment. No small bowel dilation. Appendix not seen. No secondary signs to suggest appendiceal inflammation. Abundant stool throughout the colon with mild to moderate distension of the colon but without secondary signs of inflammation. Vascular/Lymphatic: Aortic atherosclerosis. No sign of aneurysm. Smooth contour of the IVC. There is no gastrohepatic or hepatoduodenal ligament lymphadenopathy. No retroperitoneal or mesenteric lymphadenopathy. No pelvic sidewall lymphadenopathy. Reproductive: No adnexal masses.  Post hysterectomy. Other: No ascites.  Small fat containing umbilical hernia. Musculoskeletal: Spinal degenerative changes. 40% loss of height at the L5 level in this osteopenic patient. No priors for comparison of this location since September of 2020. No surrounding stranding. Mild retropulsion of posterior cortical elements without significant canal narrowing. IMPRESSION:  Worsening of disease in the chest with enlarging mediastinal adenopathy and pulmonary metastatic disease. Mixed attenuation and ground-glass nodules without change. No evidence of metastatic disease involving the abdomen or pelvis. Enlarging RIGHT-sided pleural effusion now moderate. Abundant stool in the colon, query constipation. L5 compression fracture since available imaging. Correlate with any pain in this location. Findings are age indeterminate and associated with the proximally 40% loss of height. Bilateral nephrolithiasis and vascular calcifications. Aortic Atherosclerosis (ICD10-I70.0). Electronically Signed   By: Zetta Bills M.D.   On: 03/02/2021 11:25      ASSESSMENT & PLAN:  1. Metastatic breast cancer ([place])   2. Encounter for antineoplastic chemotherapy   3. Pleural effusion   4. Chemotherapy induced neutropenia ([facility])   5. Anemia due to antineoplastic chemotherapy   6. Hypocalcemia     #Metastatic breast cancer,  ER+, PR- HER2 negative.  Labs are reviewed and discussed with patient. Continue hold abemaciclib 147m BID.   Chemotherapy induced anemia and neutropenia.  ANC 1.4, hemoglobin 8.  Hold treatment now. Repeat cbc in 1 week, if counts recover, plan to resume abemaciclib at reduced dose of 545mBID.   #Pleural effusion, moderate.  She denies shortness of breath.  Monitor symptoms. #uncontrolled brittle diabetes.follow up with pcp. # hypocalcemia, continue calcium supplement.   All questions were answered. The patient knows to call the clinic with any problems questions or concerns. Return of visit:   Follow-up with nurse practitioner in 1 week for evaluation of tolerability-11/17 Fulvestrant injection on 11/28  Follow up in 4 weeks, Lab MD + Fulvestrant  ZhEarlie ServerMD, PhD 05/24/2021

## 2021-05-29 DIAGNOSIS — H34812 Central retinal vein occlusion, left eye, with macular edema: Secondary | ICD-10-CM | POA: Diagnosis not present

## 2021-05-30 ENCOUNTER — Other Ambulatory Visit (HOSPITAL_COMMUNITY): Payer: Self-pay

## 2021-05-31 ENCOUNTER — Inpatient Hospital Stay: Payer: Medicare Other

## 2021-05-31 ENCOUNTER — Telehealth: Payer: Self-pay | Admitting: Oncology

## 2021-05-31 NOTE — Telephone Encounter (Signed)
Caregiver called to reschedule pt's appt for today. Not feeling well. Call back at [phone]

## 2021-05-31 NOTE — Telephone Encounter (Signed)
Attempted to call back to reschedule. No answer. Left VM.

## 2021-06-02 DIAGNOSIS — R636 Underweight: Secondary | ICD-10-CM | POA: Diagnosis not present

## 2021-06-02 DIAGNOSIS — E119 Type 2 diabetes mellitus without complications: Secondary | ICD-10-CM | POA: Diagnosis not present

## 2021-06-04 DIAGNOSIS — N1832 Chronic kidney disease, stage 3b: Secondary | ICD-10-CM | POA: Diagnosis not present

## 2021-06-04 DIAGNOSIS — E1122 Type 2 diabetes mellitus with diabetic chronic kidney disease: Secondary | ICD-10-CM | POA: Diagnosis not present

## 2021-06-04 DIAGNOSIS — E876 Hypokalemia: Secondary | ICD-10-CM | POA: Diagnosis not present

## 2021-06-04 DIAGNOSIS — N2581 Secondary hyperparathyroidism of renal origin: Secondary | ICD-10-CM | POA: Diagnosis not present

## 2021-06-04 DIAGNOSIS — E871 Hypo-osmolality and hyponatremia: Secondary | ICD-10-CM | POA: Diagnosis not present

## 2021-06-04 DIAGNOSIS — D631 Anemia in chronic kidney disease: Secondary | ICD-10-CM | POA: Diagnosis not present

## 2021-06-04 DIAGNOSIS — I129 Hypertensive chronic kidney disease with stage 1 through stage 4 chronic kidney disease, or unspecified chronic kidney disease: Secondary | ICD-10-CM | POA: Diagnosis not present

## 2021-06-05 ENCOUNTER — Telehealth: Payer: Self-pay | Admitting: Oncology

## 2021-06-05 ENCOUNTER — Other Ambulatory Visit (HOSPITAL_COMMUNITY): Payer: Self-pay

## 2021-06-05 NOTE — Telephone Encounter (Signed)
Caregiver called to reschedule pt's appt for 12-29. Call back at [phone]

## 2021-06-07 ENCOUNTER — Other Ambulatory Visit: Payer: Medicare Other | Admitting: Primary Care

## 2021-06-07 ENCOUNTER — Ambulatory Visit: Payer: Medicare Other | Admitting: Pharmacist

## 2021-06-07 ENCOUNTER — Other Ambulatory Visit: Payer: Self-pay

## 2021-06-07 VITALS — Ht 60.0 in | Wt 95.0 lb

## 2021-06-07 DIAGNOSIS — Z515 Encounter for palliative care: Secondary | ICD-10-CM

## 2021-06-07 DIAGNOSIS — E1140 Type 2 diabetes mellitus with diabetic neuropathy, unspecified: Secondary | ICD-10-CM

## 2021-06-07 DIAGNOSIS — Z89612 Acquired absence of left leg above knee: Secondary | ICD-10-CM

## 2021-06-07 DIAGNOSIS — I10 Essential (primary) hypertension: Secondary | ICD-10-CM

## 2021-06-07 DIAGNOSIS — I25118 Atherosclerotic heart disease of native coronary artery with other forms of angina pectoris: Secondary | ICD-10-CM

## 2021-06-07 NOTE — Chronic Care Management (AMB) (Signed)
Chronic Care Management CCM Pharmacy Note  06/07/2021 Name:  Tiffany Bailey MRN:  [id number] DOB:  July 16, 1927  Summary: - Few glucose readings. Requests new test strips script  Recommendations/Changes made from today's visit: - Recommended to continue to hold Tresiba at this time. Follow oncology appointment tomorrow for plans  Subjective: Tiffany Bailey is an 87 y.o. year old female who is a primary patient of [name], FNP.  The CCM team was consulted for assistance with disease management and care coordination needs.    Engaged with patient's daughter, Tiffany Bailey by telephone for follow up visit for pharmacy case management and/or care coordination services.   Objective:  Medications Reviewed Today     Reviewed by Levada Schilling, RN (Registered Nurse) on 05/24/21 at 1431  Med List Status: <None>   Medication Order Taking? Sig Documenting Provider Last Dose Status Informant  abemaciclib (VERZENIO) 150 MG tablet [id number]  Take 1 tablet (150 mg total) by mouth 2 (two) times daily. Swallow tablets whole. Do not chew, crush, or split tablets before swallowing.  Patient not taking: Reported on 05/23/2021   [name], MD  Active   amLODipine (NORVASC) 10 MG tablet [id number]  Take 1 tablet (10 mg total) by mouth daily. [name], FNP  Active   aspirin EC 81 MG tablet [id number]  Take 1 tablet (81 mg total) by mouth daily. [name], DO  Active Self  atorvastatin (LIPITOR) 40 MG tablet [id number]  TAKE 1 TABLET BY MOUTH  DAILY [name], FNP  Active   blood glucose meter kit and supplies KIT [id number]  Dispense based on patient and insurance preference. Use up to four times daily as directed. (FOR ICD-9 250.00, 250.01). [name], DO  Active Self  calcium carbonate (TUMS) 500 MG chewable tablet [id number]  Chew 2 tablets (400 mg of elemental calcium total) by mouth daily. Verlon Au, NP  Active   cloNIDine (CATAPRES) 0.1 MG tablet [id number]  Take 0.1 mg  by mouth 2 (two) times daily. [provider]  Active   clopidogrel (PLAVIX) 75 MG tablet [id number]  TAKE 1 TABLET BY MOUTH  DAILY [name], [name], MD  Active Self  feeding supplement (ENSURE ENLIVE / ENSURE PLUS) LIQD [id number]  Take 237 mLs by mouth 2 (two) times daily between meals. [name], MD  Active   ferrous gluconate (FERGON) 324 MG tablet [id number]  Take 324 mg by mouth 2 (two) times daily. [provider]  Active Self  glucose blood (ONE TOUCH TEST STRIPS) test strip [id number]  Use to test blood sugar twice daily [name], FNP  Active   hydrALAZINE (APRESOLINE) 10 MG tablet [id number]  Take 1 tablet (10 mg total) by mouth in the morning and at bedtime. Kathlen Mody, [name], PA-C  Active Self  Insulin Pen Needle (BD PEN NEEDLE NANO U/F) 32G X 4 MM MISC [id number]  USE 1 DAILY  Patient not taking: Reported on 05/23/2021   [name], FNP  Active Self  Lancets [facility] ULTRASOFT) lancets [id number]  1 each by Other route 2 (two) times daily. 90 day supply for [employer].  Dx E11.9  Patient not taking: Reported on 05/23/2021   [name], FNP  Active   Metoprolol Tartrate 37.5 MG TABS [id number]  Take 1 tablet by mouth 2 (two) times daily. [provider]  Active Self  nitroGLYCERIN (NITROSTAT) 0.4 MG SL tablet [id number]  Place 1 tablet (0.4 mg  total) under the tongue every 5 (five) minutes as needed for chest pain.  Patient not taking: Reported on 05/23/2021   Minna Merritts, MD  Active Self  valsartan (DIOVAN) 80 MG tablet [id number]  Take by mouth. [provider]  Active   vitamin C (ASCORBIC ACID) 250 MG tablet [id number]  Take 250 mg by mouth daily. [provider]  Active Self  Med List Note Darl Pikes, RPH-CPP 03/19/21 1411): Verzenio filled at [facility]            Pertinent Labs:   Lab Results  Component Value Date   HGBA1C 8.6 (A) 02/13/2021   Lab Results  Component  Value Date   CHOL 136 02/26/2019   HDL 46.50 02/26/2019   LDLCALC 76 02/26/2019   TRIG 63.0 02/26/2019   CHOLHDL 3 02/26/2019   Lab Results  Component Value Date   CREATININE 1.36 (H) 05/24/2021   BUN 14 05/24/2021   NA 137 05/24/2021   K 3.8 05/24/2021   CL 105 05/24/2021   CO2 21 (L) 05/24/2021    SDOH:  (Social Determinants of Health) assessments and interventions performed:  SDOH Interventions    Flowsheet Row Most Recent Value  SDOH Interventions   Financial Strain Interventions Intervention Not Indicated       CCM Care Plan  Review of patient past medical history, allergies, medications, health status, including review of consultants reports, laboratory and other test data, was performed as part of comprehensive evaluation and provision of chronic care management services.   Care Plan : Medication Management  Updates made by De Hollingshead, RPH-CPP since 06/07/2021 12:00 AM     Problem: Diabetes, HTN, CAD      Long-Range Goal: Disease Progression Prevention   Start Date: 02/22/2021  Recent Progress: On track  Priority: High  Note:   Current Barriers:  Unable to achieve control of diabetes   Pharmacist Clinical Goal(s):  Over the next 90 days, patient will achieve control of diabetes as evidenced by A1c  through collaboration with PharmD and provider.   Interventions: 1:1 collaboration with [name], FNP regarding development and update of comprehensive plan of care as evidenced by provider attestation and co-signature Inter-disciplinary care team collaboration (see longitudinal plan of care) Comprehensive medication review performed; medication list updated in electronic medical record  Diabetes: Uncontrolled; current treatment: Tresiba 6 units daily - holding right now Hx Jardiance - off due to dehydration previously  Would avoid metformin due to fluctuating renal dysfunction, avoid GLP1 given concurrent GI upset Current glucose readings:  again, Tiffany Bailey reports very few readings. Requests refill on test strips to the pharmacy. Sending today.  Reports patient is sleeping more over the last few days. Unclear if related to relative glucose elevations. Given age, risk of hypoglycemia, recommending continuing to hold insulin therapy at this time, especially if Verzenio is restarted tomorrow, leading to GI upset again.   Hypertension, CAD in the setting of CKD: Uncontrolled; current treatment: amlodipine 10 mg daily - though reports today that she is out of this medication, hydralazine 10 mg BID, clonidine 0.1 mg BID, metoprolol tartrate 37.5 mg BID;  Tiffany Bailey reports that her mother said her blood pressure was "high" this morning, but did not know a number Hx lisinopril, was previously stopped due to renal function Previously recommended to continue current regimen at this time  Hyperlipidemia and CAD: Appropriately managed; current treatment: atorvastatin 40 mg daily Antiplatelet regimen: aspirin 81 mg daily, clopidogrel 75 mg daily  Previously recommended to continue current regimen at this time along with collaboration with cardiology  Metastatic Breast Cancer: Managed by hem/onc; current regimen: stopped Verzenio due to GI upset, nausea. Follow up with cancer center tomorrow for plans to consider restarting lower dose.  Iron deficiency anemia: ferrous gluconate 324 mg BID - though patient taking daily  Patient Goals/Self-Care Activities Over the next 90 days, patient will:  - take medications as prescribed focus on medication adherence by using pill box check glucose daily to twice daily, document, and provide at future appointments check blood pressure daily, document, and provide at future appointments      Plan: Telephone follow up appointment with care management team member scheduled for:  3 weeks  [name], PharmD, [place], [place] Pharmacist [employer] at [employer] [phone]

## 2021-06-07 NOTE — Patient Instructions (Signed)
Visit Information  Following are the goals we discussed today:  Patient Goals/Self-Care Activities Over the next 90 days, patient will:  - take medications as prescribed focus on medication adherence by using pill box check glucose daily to twice daily, document, and provide at future appointments check blood pressure daily, document, and provide at future appointments        Plan: Telephone follow up appointment with care management team member scheduled for:  3 weeks   [name], PharmD, [place], CPP Clinical Pharmacist [place] at [facility] [phone]   Please call the care guide team at [phone] if you need to cancel or reschedule your appointment.   Patient verbalizes understanding of instructions provided today and agrees to view in [place].

## 2021-06-07 NOTE — Progress Notes (Signed)
Tiffany Bailey Consult Note Telephone: [phone]  Fax: [phone]    Date of encounter: 06/07/21 12:43 PM PATIENT NAME: Tiffany Bailey Tiffany Bailey [id number]   [phone] (home)  DOB: 08/25/1927 MRN: [id number] PRIMARY CARE PROVIDER:    [name], FNP,  [address] [place] [address] [phone]  REFERRING PROVIDER:   [name], FNP [address] [place],  [place] [address] [phone]  RESPONSIBLE PARTY:    Contact Information     Name Relation Home Work Mobile   Ratliff,[name] F Daughter [phone]   maile, linford   [phone]   Dixon Boos Daughter   [phone]   Annamary Carolin Daughter   [phone]       I met face to face with patient in   home. Palliative Care was asked to follow this patient by consultation request of  [name], [name], FNP to address advance care planning and complex medical decision making. This is a follow up visit.                                   ASSESSMENT AND PLAN / RECOMMENDATIONS:   Advance Care Planning/Goals of Care: Goals include to maximize quality of life and symptom management. Our advance care planning conversation included a discussion about:    The value and importance of advance care planning  Experiences with loved ones who have been seriously ill or have died  Exploration of personal, cultural or spiritual beliefs that might influence medical decisions  Exploration of goals of care in the event of a sudden injury or illness  Identification  of a healthcare agent - Engineer, maintenance of an  advance directive document . CODE STATUS: FULL   Symptom Management/Plan:  Pain : in  L leg, phantom, and in R. I recommend lyrica   50 mg bid for chronic neuropathic pain.  Nutrition: Not eating well, feels limited appetite. Reports occasional constipation.  Mood: Endorses down at times, due to  pain. Looking forward to family christmas.   Glucose: not taking any anti diabetics. Checking glucose usually but out of strips now. Has new meter from MD. States she stays in 200's.   Follow up Palliative Care Visit: Palliative care will continue to follow for complex medical decision making, advance care planning, and clarification of goals. Return 6 weeks or prn.  I spent 40 minutes providing this consultation. More than 50% of the time in this consultation was spent in counseling and care coordination.  PPS: 40%  HOSPICE ELIGIBILITY/DIAGNOSIS: TBD  Chief Complaint: hypoglycemia, pain  HISTORY OF PRESENT ILLNESS:  Tiffany Bailey is a 87 y.o. year old female  with DM, L AKA. Eye pain due to glaucoma, anorexia .   History obtained from review of EMR, discussion with primary team, and interview with family, facility staff/caregiver and/or Tiffany Bailey.  I reviewed available labs, medications, imaging, studies and related documents from the EMR.  Records reviewed and summarized above.   ROS  General: NAD EYES: denies vision changes, recently went for glaucoma pain ENMT: denies dysphagia Cardiovascular: denies chest pain, denies DOE Pulmonary: denies cough, denies increased SOB Abdomen: endorses  fair  appetite, denies constipation, endorses continence of bowel GU: denies dysuria, endorses continence of urine MSK:  endorses weakness,  no falls reported Skin: denies rashes or wounds Neurological: endorses pain in L  leg, denies insomnia Psych: Endorses positive mood Heme/lymph/immuno: denies bruises, abnormal bleeding  Physical Exam: Current and past weights: 95 lbs, Body mass index is 18.55 kg/m. Constitutional: NAD General: frail appearing, thin EYES: anicteric sclera, lids intact, no discharge  ENMT: intact hearing, oral mucous membranes moist, dentition intact CV: no LE edema Pulmonary: no increased work of breathing, no cough, room air Abdomen: intake 50%,  no ascites GU:  deferred MSK: + sarcopenia, moves all extremities,  non ambulatory, uses wheel chair Skin: warm and dry, no rashes or wounds on visible skin Neuro: +  generalized weakness,  + cognitive impairment Psych: non-anxious affect, A and O x 2 Hem/lymph/immuno: no widespread bruising   Thank you for the opportunity to participate in the care of Tiffany Bailey.  The palliative care team will continue to follow. Please call our office at [phone] if we can be of additional assistance.   Jason Coop, NP DNP, AGPCNP-BC  COVID-19 PATIENT SCREENING TOOL Asked and negative response unless otherwise noted:   Have you had symptoms of covid, tested positive or been in contact with someone with symptoms/positive test in the past 5-10 days?

## 2021-06-08 ENCOUNTER — Other Ambulatory Visit: Payer: Self-pay

## 2021-06-08 ENCOUNTER — Inpatient Hospital Stay: Payer: Medicare Other

## 2021-06-08 DIAGNOSIS — M8580 Other specified disorders of bone density and structure, unspecified site: Secondary | ICD-10-CM | POA: Diagnosis not present

## 2021-06-08 DIAGNOSIS — Z803 Family history of malignant neoplasm of breast: Secondary | ICD-10-CM | POA: Diagnosis not present

## 2021-06-08 DIAGNOSIS — D6481 Anemia due to antineoplastic chemotherapy: Secondary | ICD-10-CM | POA: Diagnosis not present

## 2021-06-08 DIAGNOSIS — Z89612 Acquired absence of left leg above knee: Secondary | ICD-10-CM | POA: Diagnosis not present

## 2021-06-08 DIAGNOSIS — D701 Agranulocytosis secondary to cancer chemotherapy: Secondary | ICD-10-CM | POA: Diagnosis not present

## 2021-06-08 DIAGNOSIS — I129 Hypertensive chronic kidney disease with stage 1 through stage 4 chronic kidney disease, or unspecified chronic kidney disease: Secondary | ICD-10-CM | POA: Diagnosis not present

## 2021-06-08 DIAGNOSIS — D631 Anemia in chronic kidney disease: Secondary | ICD-10-CM | POA: Diagnosis not present

## 2021-06-08 DIAGNOSIS — I252 Old myocardial infarction: Secondary | ICD-10-CM | POA: Diagnosis not present

## 2021-06-08 DIAGNOSIS — C50919 Malignant neoplasm of unspecified site of unspecified female breast: Secondary | ICD-10-CM

## 2021-06-08 DIAGNOSIS — E1122 Type 2 diabetes mellitus with diabetic chronic kidney disease: Secondary | ICD-10-CM | POA: Diagnosis not present

## 2021-06-08 DIAGNOSIS — N1831 Chronic kidney disease, stage 3a: Secondary | ICD-10-CM | POA: Diagnosis not present

## 2021-06-08 DIAGNOSIS — J90 Pleural effusion, not elsewhere classified: Secondary | ICD-10-CM | POA: Diagnosis not present

## 2021-06-08 DIAGNOSIS — T451X5A Adverse effect of antineoplastic and immunosuppressive drugs, initial encounter: Secondary | ICD-10-CM | POA: Diagnosis not present

## 2021-06-08 DIAGNOSIS — Z79811 Long term (current) use of aromatase inhibitors: Secondary | ICD-10-CM | POA: Diagnosis not present

## 2021-06-08 LAB — CBC WITH DIFFERENTIAL/PLATELET
Abs Immature Granulocytes: 0.04 10*3/uL (ref 0.00–0.07)
Basophils Absolute: 0.1 10*3/uL (ref 0.0–0.1)
Basophils Relative: 1 %
Eosinophils Absolute: 0.2 10*3/uL (ref 0.0–0.5)
Eosinophils Relative: 3 %
HCT: 24.7 % — ABNORMAL LOW (ref 36.0–46.0)
Hemoglobin: 8 g/dL — ABNORMAL LOW (ref 12.0–15.0)
Immature Granulocytes: 1 %
Lymphocytes Relative: 18 %
Lymphs Abs: 1.1 10*3/uL (ref 0.7–4.0)
MCH: 32.8 pg (ref 26.0–34.0)
MCHC: 32.4 g/dL (ref 30.0–36.0)
MCV: 101.2 fL — ABNORMAL HIGH (ref 80.0–100.0)
Monocytes Absolute: 0.5 10*3/uL (ref 0.1–1.0)
Monocytes Relative: 9 %
Neutro Abs: 4.3 10*3/uL (ref 1.7–7.7)
Neutrophils Relative %: 68 %
Platelets: 383 10*3/uL (ref 150–400)
RBC: 2.44 MIL/uL — ABNORMAL LOW (ref 3.87–5.11)
RDW: 15.4 % (ref 11.5–15.5)
WBC: 6.2 10*3/uL (ref 4.0–10.5)
nRBC: 0 % (ref 0.0–0.2)

## 2021-06-14 ENCOUNTER — Inpatient Hospital Stay (HOSPITAL_BASED_OUTPATIENT_CLINIC_OR_DEPARTMENT_OTHER): Payer: Medicare Other | Admitting: Oncology

## 2021-06-14 ENCOUNTER — Encounter: Payer: Self-pay | Admitting: Oncology

## 2021-06-14 ENCOUNTER — Other Ambulatory Visit: Payer: Self-pay

## 2021-06-14 ENCOUNTER — Inpatient Hospital Stay: Payer: Medicare Other

## 2021-06-14 ENCOUNTER — Inpatient Hospital Stay: Payer: Medicare Other | Admitting: Pharmacist

## 2021-06-14 ENCOUNTER — Encounter: Payer: Self-pay | Admitting: Family

## 2021-06-14 ENCOUNTER — Telehealth: Payer: Self-pay | Admitting: Oncology

## 2021-06-14 VITALS — BP 149/84 | HR 70 | Temp 97.2°F | Resp 18 | Wt 85.3 lb

## 2021-06-14 DIAGNOSIS — D6481 Anemia due to antineoplastic chemotherapy: Secondary | ICD-10-CM | POA: Diagnosis not present

## 2021-06-14 DIAGNOSIS — D701 Agranulocytosis secondary to cancer chemotherapy: Secondary | ICD-10-CM

## 2021-06-14 DIAGNOSIS — T451X5A Adverse effect of antineoplastic and immunosuppressive drugs, initial encounter: Secondary | ICD-10-CM | POA: Diagnosis not present

## 2021-06-14 DIAGNOSIS — M8580 Other specified disorders of bone density and structure, unspecified site: Secondary | ICD-10-CM | POA: Diagnosis not present

## 2021-06-14 DIAGNOSIS — Z89612 Acquired absence of left leg above knee: Secondary | ICD-10-CM | POA: Diagnosis not present

## 2021-06-14 DIAGNOSIS — J90 Pleural effusion, not elsewhere classified: Secondary | ICD-10-CM | POA: Diagnosis not present

## 2021-06-14 DIAGNOSIS — Z79811 Long term (current) use of aromatase inhibitors: Secondary | ICD-10-CM | POA: Diagnosis not present

## 2021-06-14 DIAGNOSIS — N1831 Chronic kidney disease, stage 3a: Secondary | ICD-10-CM | POA: Diagnosis not present

## 2021-06-14 DIAGNOSIS — C50919 Malignant neoplasm of unspecified site of unspecified female breast: Secondary | ICD-10-CM | POA: Diagnosis not present

## 2021-06-14 DIAGNOSIS — I252 Old myocardial infarction: Secondary | ICD-10-CM | POA: Diagnosis not present

## 2021-06-14 DIAGNOSIS — E1122 Type 2 diabetes mellitus with diabetic chronic kidney disease: Secondary | ICD-10-CM | POA: Diagnosis not present

## 2021-06-14 DIAGNOSIS — D631 Anemia in chronic kidney disease: Secondary | ICD-10-CM | POA: Diagnosis not present

## 2021-06-14 DIAGNOSIS — Z803 Family history of malignant neoplasm of breast: Secondary | ICD-10-CM | POA: Diagnosis not present

## 2021-06-14 DIAGNOSIS — I129 Hypertensive chronic kidney disease with stage 1 through stage 4 chronic kidney disease, or unspecified chronic kidney disease: Secondary | ICD-10-CM | POA: Diagnosis not present

## 2021-06-14 LAB — CBC WITH DIFFERENTIAL/PLATELET
Abs Immature Granulocytes: 0.02 10*3/uL (ref 0.00–0.07)
Basophils Absolute: 0.1 10*3/uL (ref 0.0–0.1)
Basophils Relative: 1 %
Eosinophils Absolute: 0.3 10*3/uL (ref 0.0–0.5)
Eosinophils Relative: 5 %
HCT: 28.4 % — ABNORMAL LOW (ref 36.0–46.0)
Hemoglobin: 9.3 g/dL — ABNORMAL LOW (ref 12.0–15.0)
Immature Granulocytes: 0 %
Lymphocytes Relative: 21 %
Lymphs Abs: 1.4 10*3/uL (ref 0.7–4.0)
MCH: 33.1 pg (ref 26.0–34.0)
MCHC: 32.7 g/dL (ref 30.0–36.0)
MCV: 101.1 fL — ABNORMAL HIGH (ref 80.0–100.0)
Monocytes Absolute: 0.5 10*3/uL (ref 0.1–1.0)
Monocytes Relative: 8 %
Neutro Abs: 4.2 10*3/uL (ref 1.7–7.7)
Neutrophils Relative %: 65 %
Platelets: 366 10*3/uL (ref 150–400)
RBC: 2.81 MIL/uL — ABNORMAL LOW (ref 3.87–5.11)
RDW: 14.6 % (ref 11.5–15.5)
WBC: 6.5 10*3/uL (ref 4.0–10.5)
nRBC: 0 % (ref 0.0–0.2)

## 2021-06-14 LAB — COMPREHENSIVE METABOLIC PANEL
ALT: 39 U/L (ref 0–44)
AST: 35 U/L (ref 15–41)
Albumin: 3.3 g/dL — ABNORMAL LOW (ref 3.5–5.0)
Alkaline Phosphatase: 79 U/L (ref 38–126)
Anion gap: 10 (ref 5–15)
BUN: 19 mg/dL (ref 8–23)
CO2: 23 mmol/L (ref 22–32)
Calcium: 8.8 mg/dL — ABNORMAL LOW (ref 8.9–10.3)
Chloride: 99 mmol/L (ref 98–111)
Creatinine, Ser: 1.26 mg/dL — ABNORMAL HIGH (ref 0.44–1.00)
GFR, Estimated: 40 mL/min — ABNORMAL LOW (ref >60)
Glucose, Bld: 335 mg/dL — ABNORMAL HIGH (ref 70–99)
Potassium: 4.2 mmol/L (ref 3.5–5.1)
Sodium: 132 mmol/L — ABNORMAL LOW (ref 135–145)
Total Bilirubin: 0.5 mg/dL (ref 0.3–1.2)
Total Protein: 6.4 g/dL — ABNORMAL LOW (ref 6.5–8.1)

## 2021-06-14 MED ORDER — FULVESTRANT 250 MG/5ML IM SOSY
500.0000 mg | PREFILLED_SYRINGE | Freq: Once | INTRAMUSCULAR | Status: AC
  Administered 2021-06-14: 14:00:00 500 mg via INTRAMUSCULAR
  Filled 2021-06-14: qty 10

## 2021-06-14 NOTE — Progress Notes (Signed)
Hematology/Oncology  Follow up note [phone] [phone]   Patient Care Team: [name], FNP as PCP - General (Family Medicine) Jason Coop, NP as Nurse Practitioner (Hospice and Palliative Medicine) [name], MD as Consulting Physician (Oncology) De Hollingshead, RPH-CPP (Pharmacist) Minna Merritts, MD as Consulting Physician (Cardiology) Lauretta Grill, NP as Nurse Practitioner (Nurse Practitioner) Borders, [name], NP as Nurse Practitioner (Hospice and Palliative Medicine)  REFERRING PROVIDER: [name], FNP  CHIEF COMPLAINTS/REASON FOR VISIT:  Follow up for metastatic breast cancer  HISTORY OF PRESENTING ILLNESS:   Tiffany Bailey is a  87 y.o.  female with PMH listed below was seen in consultation at the request of  [name], FNP  for evaluation of abnormal CT scan. Patient was recently evaluated by primary care provider for complaints of urinary retention, headache, constipation. 02/03/2019 x-ray of abdomen showed large colonic stool volume, nodular densities noted in the right mid lung are up). 02/08/2019 subsequent chest x-ray two-view showed hyperinflation of the lungs compatible with COPD.  Multiple nodular densities project over the mid and lower right lung. CT chest was obtained for further evaluation. 02/17/2019 CT chest without contrast Showed multiple bilateral pulmonary nodules,groundglass opacities. Largest solid nodule at the confluence of the right major and minor fissures measuring 1.6 cm.  Most notable groundglass nodule is of the right upper lobe and measure 1.8 x 0.8 cm, small right pleural effusion with associated atelectasis or consolidation.  Larger solid nodule are highly suspicious for metastatic disease. Bulky right mediastinal and hilar lymph node.  Largest pretracheal nodes measuring 3.5 x 3 cm.  There are additional bulky superior mediastinal, supraclavicular, left subpectoral lymph nodes.   Largest subpectoral nodes measuring 3.3 x 2.5 cm.  Patient was sent to cancer center for further evaluation.  #Patient reports a history of left breast cancer status post mastectomy.  She is a poor historian.  She is not able to recall the timeframe of breast cancer diagnosis and then treatments.  Denies any chemotherapy or radiation treatments.  #Left lower extremity history of amputation, CKD, History of MI, Sickle cell disease, DM, HTN, anemia.   Patient denies shortness of breath, chest pain, hemoptysis.  Night sweating, abdominal pain. Reports intentional weight loss.  Appetite is poor.  Not eating much. Also have wax and wane, chronic intermittent headache, she takes Tylenol as needed, with some relief.  Denies any nausea vomiting. She sees neurology. 11/10/2018 CT head showed no acute intracranial abnormality or significant interval changes.  Stable atrophy and white matter disease.  Atherosclerosis.  #Patient had a consultation visit with me on 02/19/2019.  At that time he was referred for abnormal CT scan.  PET scan was obtained and I recommend patient to obtain left axillary lymph node biopsy.  Patient/family called back and decided not to proceed with any additional work-up.  #February 2020 started Arimidex # 03/30/2021 - 03/31/2021 Admission due to AKI, felt to be due to dehydration, side effects of Jardiance.  Her glucose has been low and diabetes regimen has been adjusted. [name] was held.   INTERVAL HISTORY TRISH Bailey is a 87 y.o. female who has above history reviewed by me today presents for follow up visit for management of metastatic breast cancer. Patient was accompanied by her daughter Patient reports feeling well.  She is currently off abemaciclib.  Patient has lost weight.  . Review of Systems  Constitutional:  Positive for fatigue. Negative for appetite change, chills, fever and unexpected  weight change.  HENT:   Negative for hearing loss and voice change.   Eyes:   Negative for eye problems.  Respiratory:  Negative for chest tightness, cough and shortness of breath.   Cardiovascular:  Negative for chest pain.  Gastrointestinal:  Negative for abdominal distention, abdominal pain, blood in stool and constipation.  Endocrine: Negative for hot flashes.  Genitourinary:  Negative for difficulty urinating and frequency.   Musculoskeletal:  Negative for arthralgias.       Left lower extremity history of amputation  Skin:  Negative for itching and rash.  Neurological:  Negative for extremity weakness and headaches.  Hematological:  Negative for adenopathy.  Psychiatric/Behavioral:  Negative for confusion.    MEDICAL HISTORY:  Past Medical History:  Diagnosis Date   Anemia    Anginal pain ([place])    Arthritis    Breast cancer ([place]) 07/19/2019   CKD (chronic kidney disease), stage III ([place])    Coronary artery disease    a. 2002 CABG x 3 (LIMA-LAD, SVG-D1, SVG-RCA); b. s/p multiple PCI's.   Diabetes mellitus without complication ([place])    Diastolic dysfunction    a. 12/2017 Echo: EF 55-60%, no rwma, Gr1 DD. Triv AI. Mild-mod MR/TR. PASP 40mHg.   Edema    Failure to thrive in adult    GERD (gastroesophageal reflux disease)    Hb-SS disease without crisis ([facility] 10/23/2017   History of breast cancer    HOH (hard of hearing)    Hyperlipidemia    Hypertension    Myocardial infarction ([facility]    PAD (peripheral artery disease) ([facility]    a. s/p L AKA; b. 12/2018 PTA R peroneal & PTA/Stenting R SFA.   Palpitations     SURGICAL HISTORY: Past Surgical History:  Procedure Laterality Date   ABDOMINAL HYSTERECTOMY     ABOVE KNEE LEG AMPUTATION Left 2013   ANTERIOR VITRECTOMY Left 11/16/2015   Procedure: ANTERIOR VITRECTOMY;  Surgeon: BEulogio Bear MD;  Location: [facility] ORS;  Service: Ophthalmology;  Laterality: Left;   BLADDER SURGERY     CATARACT EXTRACTION W/PHACO Left 11/16/2015   Procedure: CATARACT EXTRACTION PHACO AND INTRAOCULAR LENS PLACEMENT (IOC);   Surgeon: BEulogio Bear MD;  Location: [facility] ORS;  Service: Ophthalmology;  Laterality: Left;  Lot # [id number] [place] 01:22.8 AP%:12.6 CDE: 10.46   CATARACT EXTRACTION W/PHACO Right 08/22/2016   Procedure: CATARACT EXTRACTION PHACO AND INTRAOCULAR LENS PLACEMENT (IOC);  Surgeon: BEulogio Bear MD;  Location: [facility] ORS;  Service: Ophthalmology;  Laterality: Right;  Lot # [id number] [place] 00:52.1 AP%:8.7 CDE: 4.99    CORONARY ANGIOPLASTY     STENT   CORONARY ARTERY BYPASS GRAFT     LOWER EXTREMITY ANGIOGRAPHY Right 01/14/2019   Procedure: LOWER EXTREMITY ANGIOGRAPHY;  Surgeon: DAlgernon Huxley MD;  [facility];  Service: Cardiovascular;  Laterality: Right;   MASTECTOMY     TUMOR REMOVAL     ABDOMINAL    SOCIAL HISTORY: Social History   Socioeconomic History   Marital status: Divorced    Spouse name: Not on file   Number of children: 7   Years of education: Not on file   Highest education level: Not on file  Occupational History   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  Tobacco Use   Smoking status: Never   Smokeless tobacco: Never  Substance and Sexual Activity   Alcohol use: No   Drug use: No   Sexual activity: Not Currently  Other Topics Concern  Not on file  Social History Narrative   Not on file   Social Determinants of Health   Financial Resource Strain: Low Risk    Difficulty of Paying Living Expenses: Not hard at all  Food Insecurity: Not on file  Transportation Needs: Not on file  Physical Activity: Not on file  Stress: Not on file  Social Connections: Not on file  Intimate Partner Violence: Not on file    FAMILY HISTORY: Family History  Problem Relation Age of Onset   Sudden death Mother    Arthritis Father    Stroke Father    Breast cancer Sister    Diabetes Grandchild     ALLERGIES:  is allergic to amoxicillin, gabapentin, mirtazapine, and other.  MEDICATIONS:  Current Outpatient Medications  Medication Sig Dispense  Refill   amLODipine (NORVASC) 10 MG tablet Take 1 tablet (10 mg total) by mouth daily. 90 tablet 1   aspirin EC 81 MG tablet Take 1 tablet (81 mg total) by mouth daily. 30 tablet 5   atorvastatin (LIPITOR) 40 MG tablet TAKE 1 TABLET BY MOUTH  DAILY 90 tablet 3   cloNIDine (CATAPRES) 0.1 MG tablet Take 0.1 mg by mouth 2 (two) times daily.     clopidogrel (PLAVIX) 75 MG tablet TAKE 1 TABLET BY MOUTH  DAILY 30 tablet 0   feeding supplement (ENSURE ENLIVE / ENSURE PLUS) LIQD Take 237 mLs by mouth 2 (two) times daily between meals.     ferrous gluconate (FERGON) 324 MG tablet Take 324 mg by mouth 2 (two) times daily.     glucose blood (ONE TOUCH TEST STRIPS) test strip Use to test blood sugar twice daily 200 each 3   hydrALAZINE (APRESOLINE) 10 MG tablet Take 1 tablet (10 mg total) by mouth in the morning and at bedtime. 180 tablet 3   Insulin Pen Needle (BD PEN NEEDLE NANO U/F) 32G X 4 MM MISC USE 1 DAILY 100 each 12   Lancets (ONETOUCH ULTRASOFT) lancets 1 each by Other route 2 (two) times daily. 90 day supply for [employer].  Dx E11.9 300 each 12   loperamide (IMODIUM) 2 MG capsule Take 1 capsule (2 mg total) by mouth See admin instructions. Initial: 4 mg, followed by 2 mg after each loose stool; maximum: 16 mg/day 60 capsule 1   Metoprolol Tartrate 37.5 MG TABS Take 1 tablet by mouth 2 (two) times daily.     ondansetron (ZOFRAN) 4 MG tablet Take 1 tablet (4 mg total) by mouth every 6 (six) hours as needed for nausea or vomiting. 60 tablet 1   valsartan (DIOVAN) 80 MG tablet Take by mouth.     vitamin C (ASCORBIC ACID) 250 MG tablet Take 250 mg by mouth daily.     abemaciclib (VERZENIO) 50 MG tablet Take 1 tablet (50 mg total) by mouth 2 (two) times daily. 56 tablet 0   blood glucose meter kit and supplies KIT Dispense based on patient and insurance preference. Use up to four times daily as directed. (FOR ICD-9 250.00, 250.01). (Patient not taking: Reported on 06/14/2021) 1 each 0    nitroGLYCERIN (NITROSTAT) 0.4 MG SL tablet Place 1 tablet (0.4 mg total) under the tongue every 5 (five) minutes as needed for chest pain. (Patient not taking: Reported on 05/23/2021) 25 tablet 4   No current facility-administered medications for this visit.     PHYSICAL EXAMINATION: ECOG PERFORMANCE STATUS: 2 - Symptomatic, <50% confined to bed Vitals:   06/14/21 1346  BP: Marland Kitchen)  149/84  Pulse: 70  Resp: 18  Temp: (!) 97.2 F (36.2 C)   Filed Weights   06/14/21 1346  Weight: 85 lb 4.8 oz (38.7 kg)    Physical Exam Constitutional:      General: She is not in acute distress.    Comments: She sits in the wheel chair.  Frail appearance  HENT:     Head: Normocephalic and atraumatic.  Eyes:     General: No scleral icterus.    Pupils: Pupils are equal, round, and reactive to light.  Cardiovascular:     Rate and Rhythm: Normal rate and regular rhythm.     Heart sounds: Normal heart sounds.  Pulmonary:     Effort: Pulmonary effort is normal. No respiratory distress.     Breath sounds: No wheezing.     Comments: Decreased breath sound right lower lobe.  Good air entry on the left base.  Abdominal:     General: Bowel sounds are normal. There is no distension.     Palpations: Abdomen is soft.  Musculoskeletal:        General: No deformity. Normal range of motion.     Cervical back: Normal range of motion and neck supple.     Comments: Left lower extremity history of amputation.   Skin:    General: Skin is warm and dry.     Findings: No erythema or rash.  Neurological:     Mental Status: She is alert and oriented to person, place, and time. Mental status is at baseline.     Cranial Nerves: No cranial nerve deficit.     Coordination: Coordination normal.  Psychiatric:        Mood and Affect: Mood normal.     LABORATORY DATA:  I have reviewed the data as listed Lab Results  Component Value Date   WBC 6.5 06/14/2021   HGB 9.3 (L) 06/14/2021   HCT 28.4 (L) 06/14/2021    MCV 101.1 (H) 06/14/2021   PLT 366 06/14/2021   Recent Labs    03/30/21 1650 03/31/21 0428 05/03/21 1005 05/24/21 1414 06/14/21 1326  NA  --    < > 124* 137 132*  K  --    < > 3.7 3.8 4.2  CL  --    < > 96* 105 99  CO2  --    < > 19* 21* 23  GLUCOSE  --    < > 113* 217* 335*  BUN  --    < > _0 CREATININE  --    < > 1.29* 1.36* 1.26*  CALCIUM  --    < > 7.6* 8.5* 8.8*  GFRNONAA  --    < > 39* 37* 40*  PROT 6.5   < > 6.1* 5.7* 6.4*  ALBUMIN 3.1*   < > 3.1* 3.0* 3.3*  AST 165*   < > 28 41 35  ALT 167*   < > 19 21 39  ALKPHOS 89   < > 76 88 79  BILITOT 0.7   < > 0.6 0.7 0.5  BILIDIR 0.1  --   --   --   --   IBILI 0.6  --   --   --   --    < > = values in this interval not displayed.    Iron/TIBC/Ferritin/ %Sat    Component Value Date/Time   IRON 33 01/12/2021 1133   TIBC 220 (L) 01/12/2021 1133   FERRITIN 74 01/12/2021 1133  IRONPCTSAT 15 01/12/2021 1133   IRONPCTSAT 27 06/19/2018 1006      RADIOGRAPHIC STUDIES: I have personally reviewed the radiological images as listed and agreed with the findings in the report. DG Chest 2 View  Result Date: 03/30/2021 CLINICAL DATA:  Weakness. EXAM: CHEST - 2 VIEW COMPARISON:  Chest x-ray 02/13/2021, CT chest 03/21/2020 chest x-ray 10/17/2017, CT chest 03/01/2021 FINDINGS: The heart and mediastinal contours are unchanged. Aortic calcification. Coronary artery stents. Hyperinflation of the lungs. Slightly less conspicuous patchy nodular like densities. No pulmonary edema. Blunting of bilateral costophrenic angles with trace bilateral pleural effusions not excluded. No pneumothorax. No acute osseous abnormality. IMPRESSION: Slightly less conspicuous patchy nodular like densities. Electronically Signed   By: Iven Finn M.D.   On: 03/30/2021 17:35      ASSESSMENT & PLAN:  1. Metastatic breast cancer ([place])   2. Chemotherapy induced neutropenia ([facility])   3. Anemia due to antineoplastic chemotherapy     #Metastatic breast  cancer,  ER+, PR- HER2 negative.  Labs are reviewed and discussed with patient Counts are improving. Proceed with fulvestrant injection today. I would like to continue to hold abemaciclib for another 2 weeks.  Then resume on abemaciclib at a lower dose of 50 mg twice daily. Repeat CT scan for evaluation of disease status prior to next visit. I called patient's daughter [name] and discussed with her about the plan.   Chemotherapy induced anemia and neutropenia.  Hemoglobin has improved. Neutropenia has resolved.  #Pleural effusion, moderate.  She denies shortness of breath.  Monitor symptoms. #uncontrolled brittle diabetes.follow up with pcp.  Recommend patient to discuss with PCP if she should resume insulin. # hypocalcemia, continue calcium supplement.   All questions were answered. The patient knows to call the clinic with any problems questions or concerns.   Follow up in 4 weeks, Lab MD + Fulvestrant  [name], MD, PhD 06/14/2021

## 2021-06-14 NOTE — Progress Notes (Signed)
Patient here for follow up. Pt reports having loss of appetite and urinary frequecy. She recently completed a course of antibiotics for UTI, but continues to have symptoms.

## 2021-06-14 NOTE — Telephone Encounter (Signed)
Caregiver called to reschedule appt for today. Call back at [phone]

## 2021-06-15 ENCOUNTER — Telehealth: Payer: Self-pay | Admitting: Family

## 2021-06-15 ENCOUNTER — Other Ambulatory Visit: Payer: Self-pay

## 2021-06-15 DIAGNOSIS — E1159 Type 2 diabetes mellitus with other circulatory complications: Secondary | ICD-10-CM

## 2021-06-15 MED ORDER — INSULIN DEGLUDEC 100 UNIT/ML ~~LOC~~ SOPN: 4.0000 [IU] | PEN_INJECTOR | Freq: Every day | SUBCUTANEOUS | 2 refills | Status: DC

## 2021-06-15 MED ORDER — GLUCOSE BLOOD VI STRP
ORAL_STRIP | 3 refills | Status: AC
Start: 2021-06-15 — End: ?

## 2021-06-15 NOTE — Telephone Encounter (Signed)
FYI I spoke with patient's daughter & she actually spoke with patient on land line. Pt stated to daughter that it had been several days since any frequent urination & that her urine had slowed down to her normal. She also had just got home & did not want to go back out plus was alone at the time. I advised patient's daughter in light of this that she could start back the [place] 4 units qam. I asked her to please have patient check & report to [place] blood sugars fasting plus two hours after largest meal.

## 2021-06-15 NOTE — Telephone Encounter (Signed)
Call daughter  Yes she can drop urine however we close at 5 and urine culture may not ne available until Sunday or later with holiday I will look at UA and if grossly positive I will treat empirically.  She is PCN allergic and would consider macrobid

## 2021-06-15 NOTE — Telephone Encounter (Signed)
Patient's daughter [name] stated that she is complaining of more frequent urination & afraid that she has UTI. She is not with her mother due to her having Covid. She said that if willing someone could bring patient to leave a urine sample. She will not go to UC & I am afraid to advise that anyway.  I will call back & have patient come for urine sample.

## 2021-06-15 NOTE — Telephone Encounter (Signed)
FYI catie and as discussed  Patient resumed fulvestrant injection yesterday  Per dr Talbert Cage, She will restart abemaciclib  in  2 weeks.  Then resume on abemaciclib at a lower dose of 50 mg twice daily  Call pt sarah Do we have other blood sugar readings? Are these fasting or 2 hours after a meal? MOST important to ensure that escalation of blood sugars is not related to infection. Does she have dysuria, cough, fever, chills, abdominal pain?  Does she feel patient needs to go to hospital for observation, support?  I am concerned with hypoglycemia with chemo restarted and her lack of appetite.   However if no concerns for infection or need for higher level of care ( hospital) we can resume insulin.   Restart tresiba 4units qam.

## 2021-06-18 ENCOUNTER — Encounter: Payer: Self-pay | Admitting: Oncology

## 2021-06-18 ENCOUNTER — Other Ambulatory Visit (HOSPITAL_COMMUNITY): Payer: Self-pay

## 2021-06-19 ENCOUNTER — Encounter: Payer: Self-pay | Admitting: Family

## 2021-06-19 NOTE — Telephone Encounter (Signed)
DISREGARD previous message  WRONG INSULIN written: error Lantus 40 units   Call pt How is she feeling?  Confirm no concerns for infection including urinary frequency, dysuria, fever Is she eating and staying hydrated? We started tresiba 4 units every morning.  Please ensure she is not having low blood sugars

## 2021-06-19 NOTE — Telephone Encounter (Signed)
Call pt How is she feeling?  Confirm no concerns for infection including urinary frequency, dysuria, fever Is she eating and staying hydrated? We started Lantus 40 units every morning.  Please ensure she is not having low blood sugars.

## 2021-06-19 NOTE — Telephone Encounter (Signed)
noted 

## 2021-06-19 NOTE — Telephone Encounter (Signed)
Just FYI. I called and spoke with patient's daughter [name] who has only talked with patient on the phone due to her having Covid herself. Pt reported to her that she was feeling better & actually had some of her appetite back. She did not complain of any urinary frequency or other UTI symptoms. She did start back [place] 4 units, but did report any blood sugars & did not mention any lows. Pt did tell daughter that she had misplaced testing strips & monitor. [name] will call patient to see if she found & if not I can resend in the [place] for her.

## 2021-06-20 ENCOUNTER — Other Ambulatory Visit: Payer: Self-pay

## 2021-06-20 MED ORDER — ONETOUCH VERIO REFLECT W/DEVICE KIT: 1.0000 | PACK | Freq: Every day | 0 refills | Status: AC

## 2021-06-21 ENCOUNTER — Inpatient Hospital Stay: Payer: Medicare Other | Attending: Oncology

## 2021-06-21 DIAGNOSIS — D631 Anemia in chronic kidney disease: Secondary | ICD-10-CM | POA: Insufficient documentation

## 2021-06-21 DIAGNOSIS — M8580 Other specified disorders of bone density and structure, unspecified site: Secondary | ICD-10-CM | POA: Insufficient documentation

## 2021-06-21 DIAGNOSIS — D6481 Anemia due to antineoplastic chemotherapy: Secondary | ICD-10-CM | POA: Insufficient documentation

## 2021-06-21 DIAGNOSIS — Z89612 Acquired absence of left leg above knee: Secondary | ICD-10-CM | POA: Insufficient documentation

## 2021-06-21 DIAGNOSIS — Z803 Family history of malignant neoplasm of breast: Secondary | ICD-10-CM | POA: Insufficient documentation

## 2021-06-21 DIAGNOSIS — C50919 Malignant neoplasm of unspecified site of unspecified female breast: Secondary | ICD-10-CM | POA: Insufficient documentation

## 2021-06-21 DIAGNOSIS — Z79811 Long term (current) use of aromatase inhibitors: Secondary | ICD-10-CM | POA: Insufficient documentation

## 2021-06-21 DIAGNOSIS — I252 Old myocardial infarction: Secondary | ICD-10-CM | POA: Insufficient documentation

## 2021-06-21 DIAGNOSIS — T451X5D Adverse effect of antineoplastic and immunosuppressive drugs, subsequent encounter: Secondary | ICD-10-CM | POA: Insufficient documentation

## 2021-06-21 DIAGNOSIS — J90 Pleural effusion, not elsewhere classified: Secondary | ICD-10-CM | POA: Insufficient documentation

## 2021-06-21 DIAGNOSIS — Z9013 Acquired absence of bilateral breasts and nipples: Secondary | ICD-10-CM | POA: Insufficient documentation

## 2021-06-21 DIAGNOSIS — E1122 Type 2 diabetes mellitus with diabetic chronic kidney disease: Secondary | ICD-10-CM | POA: Insufficient documentation

## 2021-06-21 DIAGNOSIS — D571 Sickle-cell disease without crisis: Secondary | ICD-10-CM | POA: Insufficient documentation

## 2021-06-21 DIAGNOSIS — N1831 Chronic kidney disease, stage 3a: Secondary | ICD-10-CM | POA: Insufficient documentation

## 2021-06-21 DIAGNOSIS — I129 Hypertensive chronic kidney disease with stage 1 through stage 4 chronic kidney disease, or unspecified chronic kidney disease: Secondary | ICD-10-CM | POA: Insufficient documentation

## 2021-06-21 NOTE — Progress Notes (Signed)
Nutrition Follow-up:  Patient with stage IV breast cancer. Holding abemaciclib for another 2 weeks then planning to resume at lower dose.    Spoke with patient and daughter [name] by phone.  Patient reports that her appetite is "fair".  Noted recent UTI.  Says that she was able to eat bacon, biscuit with jelly this am for breakfast.  Had 1/2 piece of fish for lunch, bite of slaw, 1 hushpuppy.  Drinking 1 ensure a day.    Denies nausea.  Reports bowel movement every 2-3 days.      Medications: reviewed  Labs: reviewed  Anthropometrics:   Weight 85 lb 4.8 oz on 12/29  95 lb on 12/8 85 lb on 10/27 (cancer center)   NUTRITION DIAGNOSIS: Underweight continues   INTERVENTION:  Patient may benefit from trial of appetite stimulant Reviewed ways to add calories in diet to promote weight gain. Increase ensure to BID if able    MONITORING, EVALUATION, GOAL: weight trends, intake   NEXT VISIT: Thursday, Feb 2 phone call  Jabree Rebert B. Zenia Resides, [place], [place] Registered Dietitian [phone] (mobile)

## 2021-06-25 ENCOUNTER — Other Ambulatory Visit: Payer: Self-pay

## 2021-06-25 ENCOUNTER — Encounter: Payer: Self-pay | Admitting: Family

## 2021-06-25 ENCOUNTER — Inpatient Hospital Stay (HOSPITAL_BASED_OUTPATIENT_CLINIC_OR_DEPARTMENT_OTHER): Payer: Medicare Other | Admitting: Hospice and Palliative Medicine

## 2021-06-25 DIAGNOSIS — Z515 Encounter for palliative care: Secondary | ICD-10-CM | POA: Diagnosis not present

## 2021-06-25 DIAGNOSIS — C50919 Malignant neoplasm of unspecified site of unspecified female breast: Secondary | ICD-10-CM

## 2021-06-25 NOTE — Progress Notes (Signed)
Virtual Visit via Telephone Note  I connected with Tiffany Bailey on 06/25/21 at 11:30 AM EST by telephone and verified that I am speaking with the correct person using two identifiers.  Location: Patient: Home Provider: Clinic   I discussed the limitations, risks, security and privacy concerns of performing an evaluation and management service by telephone and the availability of in person appointments. I also discussed with the patient that there may be a patient responsible charge related to this service. The patient expressed understanding and agreed to proceed.   History of Present Illness: Tiffany Bailey is a 88 y.o. female with multiple medical problems including COPD, PVD status post history of left lower extremity amputation, CKD, CAD, sickle cell disease, diabetes, hypertension, and stage IV breast cancer.  CT of the chest, abdomen, and pelvis on 03/01/2021 revealed worsening adenopathy in the chest and enlarging right-sided pleural effusion with multiple pulmonary nodules concerning for disease progression.  Patient was rotated from Arimidex to fulvestrant plus abemaciclib but has had poor tolerance of treatment.  Patient was referred to palliative care to address goals.   Observations/Objective: Spoke with patient's daughter.  She reports that patient is doing reasonably well.  No significant changes or concerns today.  Appetite is reportedly poor but patient is drinking supplements several times a day.  She is being followed actively by nutritionist.  Assessment and Plan: Stage IV Breast Cancer - abemaciclib currently held by Dr. Tasia Catchings. Reportedly doing well with minimal symptoms. Will continue to follow.   Follow Up Instructions: Follow up telephone visit in 1-2 months.    I discussed the assessment and treatment plan with the patient. The patient was provided an opportunity to ask questions and all were answered. The patient agreed with the plan and demonstrated an understanding of  the instructions.   The patient was advised to call back or seek an in-person evaluation if the symptoms worsen or if the condition fails to improve as anticipated.  I provided 5 minutes of non-face-to-face time during this encounter.   Irean Hong, NP

## 2021-06-26 ENCOUNTER — Other Ambulatory Visit: Payer: Self-pay

## 2021-06-26 ENCOUNTER — Encounter: Payer: Self-pay | Admitting: Family

## 2021-06-26 MED ORDER — ONETOUCH ULTRASOFT LANCETS MISC: 1.0000 | Freq: Two times a day (BID) | 12 refills | Status: AC

## 2021-06-27 ENCOUNTER — Encounter: Payer: Self-pay | Admitting: Family

## 2021-06-27 DIAGNOSIS — E119 Type 2 diabetes mellitus without complications: Secondary | ICD-10-CM

## 2021-06-27 MED ORDER — BD PEN NEEDLE NANO U/F 32G X 4 MM MISC: 12 refills | Status: AC

## 2021-06-28 ENCOUNTER — Other Ambulatory Visit: Payer: Self-pay | Admitting: Family

## 2021-06-28 DIAGNOSIS — I10 Essential (primary) hypertension: Secondary | ICD-10-CM

## 2021-06-28 MED ORDER — CLONIDINE HCL 0.1 MG PO TABS: 0.1000 mg | ORAL_TABLET | Freq: Two times a day (BID) | ORAL | 2 refills | Status: AC

## 2021-06-29 ENCOUNTER — Ambulatory Visit (INDEPENDENT_AMBULATORY_CARE_PROVIDER_SITE_OTHER): Payer: Medicare Other | Admitting: Pharmacist

## 2021-06-29 ENCOUNTER — Encounter: Payer: Self-pay | Admitting: Oncology

## 2021-06-29 DIAGNOSIS — E1159 Type 2 diabetes mellitus with other circulatory complications: Secondary | ICD-10-CM

## 2021-06-29 DIAGNOSIS — I25118 Atherosclerotic heart disease of native coronary artery with other forms of angina pectoris: Secondary | ICD-10-CM

## 2021-06-29 DIAGNOSIS — Z794 Long term (current) use of insulin: Secondary | ICD-10-CM

## 2021-06-29 DIAGNOSIS — I10 Essential (primary) hypertension: Secondary | ICD-10-CM

## 2021-06-29 MED ORDER — FREESTYLE LIBRE 2 READER DEVI: 1 refills | Status: AC

## 2021-06-29 MED ORDER — FREESTYLE LIBRE 2 SENSOR MISC: 1.0000 | 11 refills | Status: DC

## 2021-06-29 NOTE — Chronic Care Management (AMB) (Signed)
Chronic Care Management CCM Pharmacy Note  06/29/2021 Name:  Tiffany Bailey MRN:  [id number] DOB:  03-17-1928  Summary: - Patient unable to consistently check glucose with fingerstick glucometer. CGM may be covered  Recommendations/Changes made from today's visit: - Start using [place]. Scheduled for nurse visit for teaching  Subjective: Tiffany Bailey is an 88 y.o. year old female who is a primary patient of [name], FNP.  The CCM team was consulted for assistance with disease management and care coordination needs.    Engaged with patient by telephone for follow up visit for pharmacy case management and/or care coordination services.   Objective:  Medications Reviewed Today     Reviewed by [name], FNP (Family Nurse Practitioner) on 06/15/21 at 1345  Med List Status: <None>   Medication Order Taking? Sig Documenting Provider Last Dose Status Informant  abemaciclib (VERZENIO) 50 MG tablet [id number] No Take 1 tablet (50 mg total) by mouth 2 (two) times daily. [name], MD Unknown Active   amLODipine (NORVASC) 10 MG tablet [id number] No Take 1 tablet (10 mg total) by mouth daily. [name], FNP Taking Active   aspirin EC 81 MG tablet [id number] No Take 1 tablet (81 mg total) by mouth daily. [name], DO Taking Active Self  atorvastatin (LIPITOR) 40 MG tablet [id number] No TAKE 1 TABLET BY MOUTH  DAILY [name], FNP Taking Active   blood glucose meter kit and supplies KIT [id number] No Dispense based on patient and insurance preference. Use up to four times daily as directed. (FOR ICD-9 250.00, 250.01).  Patient not taking: Reported on 06/14/2021   [name], DO Not Taking Active Self  cloNIDine (CATAPRES) 0.1 MG tablet [id number] No Take 0.1 mg by mouth 2 (two) times daily. [provider] Taking Active   clopidogrel (PLAVIX) 75 MG tablet [id number] No TAKE 1 TABLET BY MOUTH  DAILY [name], [name], MD Taking Active Self  feeding  supplement (ENSURE ENLIVE / ENSURE PLUS) LIQD [id number] No Take 237 mLs by mouth 2 (two) times daily between meals. [name], MD Taking Active   ferrous gluconate (FERGON) 324 MG tablet [id number] No Take 324 mg by mouth 2 (two) times daily. [provider] Taking Active Self  glucose blood (ONE TOUCH TEST STRIPS) test strip [id number] No Use to test blood sugar twice daily [name], FNP Taking Active   hydrALAZINE (APRESOLINE) 10 MG tablet [id number] No Take 1 tablet (10 mg total) by mouth in the morning and at bedtime. Kathlen Mody, [name], PA-C Taking Active Self  Insulin Pen Needle (BD PEN NEEDLE NANO U/F) 32G X 4 MM MISC [id number] No USE 1 DAILY [name], FNP Taking Active Self  Lancets [facility] ULTRASOFT) lancets [id number] No 1 each by Other route 2 (two) times daily. 90 day supply for [employer].  Dx E11.9 [name], FNP Taking Active   loperamide (IMODIUM) 2 MG capsule [id number] No Take 1 capsule (2 mg total) by mouth See admin instructions. Initial: 4 mg, followed by 2 mg after each loose stool; maximum: 16 mg/day [name], MD Taking Active   Metoprolol Tartrate 37.5 MG TABS [id number] No Take 1 tablet by mouth 2 (two) times daily. [provider] Taking Active Self  nitroGLYCERIN (NITROSTAT) 0.4 MG SL tablet [id number] No Place 1 tablet (0.4 mg total) under the tongue every 5 (five) minutes as needed for chest pain.  Patient not taking: Reported on 05/23/2021  Minna Merritts, MD Not Taking Active Self  ondansetron (ZOFRAN) 4 MG tablet [id number] No Take 1 tablet (4 mg total) by mouth every 6 (six) hours as needed for nausea or vomiting. [name], MD Taking Active   valsartan (DIOVAN) 80 MG tablet [id number] No Take by mouth. [provider] Taking Active   vitamin C (ASCORBIC ACID) 250 MG tablet [id number] No Take 250 mg by mouth daily. [provider] Taking Active Self  Med List Note Darl Pikes, RPH-CPP 03/19/21  1411): Verzenio filled at [facility]            Pertinent Labs:   Lab Results  Component Value Date   HGBA1C 8.6 (A) 02/13/2021   Lab Results  Component Value Date   CHOL 136 02/26/2019   HDL 46.50 02/26/2019   LDLCALC 76 02/26/2019   TRIG 63.0 02/26/2019   CHOLHDL 3 02/26/2019   Lab Results  Component Value Date   CREATININE 1.26 (H) 06/14/2021   BUN 19 06/14/2021   NA 132 (L) 06/14/2021   K 4.2 06/14/2021   CL 99 06/14/2021   CO2 23 06/14/2021    SDOH:  (Social Determinants of Health) assessments and interventions performed:  SDOH Interventions    Flowsheet Row Most Recent Value  SDOH Interventions   Financial Strain Interventions Intervention Not Indicated       CCM Care Plan  Review of patient past medical history, allergies, medications, health status, including review of consultants reports, laboratory and other test data, was performed as part of comprehensive evaluation and provision of chronic care management services.   Care Plan : Medication Management  Updates made by De Hollingshead, RPH-CPP since 06/29/2021 12:00 AM     Problem: Diabetes, HTN, CAD      Long-Range Goal: Disease Progression Prevention   Start Date: 02/22/2021  Recent Progress: On track  Priority: High  Note:   Current Barriers:  Unable to achieve control of diabetes   Pharmacist Clinical Goal(s):  Over the next 90 days, patient will achieve control of diabetes as evidenced by A1c  through collaboration with PharmD and provider.   Interventions: 1:1 collaboration with [name], FNP regarding development and update of comprehensive plan of care as evidenced by provider attestation and co-signature Inter-disciplinary care team collaboration (see longitudinal plan of care) Comprehensive medication review performed; medication list updated in electronic medical record  Diabetes: Uncontrolled; current treatment: Tresiba 4 units daily - not taking right  now Hx Jardiance - off due to dehydration previously  Would avoid metformin due to fluctuating renal dysfunction, avoid GLP1 given concurrent GI upset Current glucose readings: patient reports 1 reading of 140, though unclear when. Reports she has a hard time with fingersticks working, often getting error messages.  Discussed CGM. Patient interested. PCP in agreement. Script sent for [place] 2 CGM sensor and reader. Called pharmacy, $0 copay. Scheduled for nurse visit next week for teaching.    Hypertension, CAD in the setting of CKD: Uncontrolled; current treatment: amlodipine 10 mg daily - though reports today that she is out of this medication, hydralazine 10 mg BID, clonidine 0.1 mg BID, metoprolol tartrate 37.5 mg BID;  [name] reports that her mother said her blood pressure was "high" this morning, but did not know a number Hx lisinopril, was previously stopped due to renal function Previously recommended to continue current regimen at this time  Hyperlipidemia and CAD: Appropriately managed; current treatment: atorvastatin 40 mg daily Antiplatelet regimen: aspirin 81 mg  daily, clopidogrel 75 mg daily Previously recommended to continue current regimen at this time along with collaboration with cardiology  Metastatic Breast Cancer: Managed by hem/onc; current regimen: Verzenio on hold due to GI upset, nausea. Iron deficiency anemia: ferrous gluconate 324 mg BID - though patient taking daily  Patient Goals/Self-Care Activities Over the next 90 days, patient will:  - take medications as prescribed focus on medication adherence by using pill box check glucose daily to twice daily, document, and provide at future appointments check blood pressure daily, document, and provide at future appointments      Plan: Face to Face appointment with care management team member scheduled for: 6 weeks  [name], PharmD, [place], [place] Pharmacist [employer] at [employer] [phone]

## 2021-06-29 NOTE — Patient Instructions (Signed)
Visit Information  Following are the goals we discussed today:    Patient Goals/Self-Care Activities Over the next 90 days, patient will:  - take medications as prescribed focus on medication adherence by using pill box check glucose daily to twice daily, document, and provide at future appointments check blood pressure daily, document, and provide at future appointments        Plan: Face to Face appointment with care management team member scheduled for: 6 weeks   [name], PharmD, [place], CPP Clinical Pharmacist [place] at [facility] [phone]   Please call the care guide team at [phone] if you need to cancel or reschedule your appointment.   Patient verbalizes understanding of instructions and care plan provided today and agrees to view in [place]. Active MyChart status confirmed with patient.

## 2021-07-02 ENCOUNTER — Ambulatory Visit: Payer: Medicare Other

## 2021-07-02 ENCOUNTER — Other Ambulatory Visit: Payer: Self-pay

## 2021-07-02 DIAGNOSIS — E1159 Type 2 diabetes mellitus with other circulatory complications: Secondary | ICD-10-CM

## 2021-07-02 DIAGNOSIS — Z794 Long term (current) use of insulin: Secondary | ICD-10-CM

## 2021-07-02 NOTE — Progress Notes (Signed)
Patient came in with her two granddaughters today for [place] teaching. Patient & granddaughters were shown figures of [place], how to use to check glucose, alarm settings as well as high/low settings. I advised that alarms would go off when patient drops below 70 mg/dL & over 240 mg/dL. I demonstrated how to apply sensor & clean arm as well. I recommended Libre patches for extra support in case worry if sensor falling off. Also showed them how to charge & advised they chart every few days as needed.  All questions were answered & were instructed to call me or [name] if any issues Libre device.

## 2021-07-03 ENCOUNTER — Other Ambulatory Visit (HOSPITAL_COMMUNITY): Payer: Self-pay

## 2021-07-04 ENCOUNTER — Encounter: Payer: Self-pay | Admitting: Oncology

## 2021-07-04 ENCOUNTER — Other Ambulatory Visit: Payer: Self-pay | Admitting: *Deleted

## 2021-07-04 DIAGNOSIS — C50919 Malignant neoplasm of unspecified site of unspecified female breast: Secondary | ICD-10-CM

## 2021-07-05 DIAGNOSIS — R636 Underweight: Secondary | ICD-10-CM | POA: Diagnosis not present

## 2021-07-05 DIAGNOSIS — E119 Type 2 diabetes mellitus without complications: Secondary | ICD-10-CM | POA: Diagnosis not present

## 2021-07-06 ENCOUNTER — Other Ambulatory Visit: Payer: Self-pay

## 2021-07-06 ENCOUNTER — Encounter: Payer: Self-pay | Admitting: Oncology

## 2021-07-06 ENCOUNTER — Ambulatory Visit
Admission: RE | Admit: 2021-07-06 | Discharge: 2021-07-06 | Disposition: A | Discharge status: Home / Self Care | Payer: Medicare Other | Source: Ambulatory Visit | Attending: Oncology | Admitting: Oncology

## 2021-07-06 DIAGNOSIS — R599 Enlarged lymph nodes, unspecified: Secondary | ICD-10-CM | POA: Diagnosis not present

## 2021-07-06 DIAGNOSIS — J90 Pleural effusion, not elsewhere classified: Secondary | ICD-10-CM | POA: Insufficient documentation

## 2021-07-06 DIAGNOSIS — J929 Pleural plaque without asbestos: Secondary | ICD-10-CM | POA: Diagnosis not present

## 2021-07-06 DIAGNOSIS — I2510 Atherosclerotic heart disease of native coronary artery without angina pectoris: Secondary | ICD-10-CM | POA: Diagnosis not present

## 2021-07-06 DIAGNOSIS — I700 Atherosclerosis of aorta: Secondary | ICD-10-CM | POA: Insufficient documentation

## 2021-07-06 DIAGNOSIS — C50919 Malignant neoplasm of unspecified site of unspecified female breast: Secondary | ICD-10-CM | POA: Diagnosis not present

## 2021-07-06 DIAGNOSIS — N200 Calculus of kidney: Secondary | ICD-10-CM | POA: Insufficient documentation

## 2021-07-09 DIAGNOSIS — H40003 Preglaucoma, unspecified, bilateral: Secondary | ICD-10-CM | POA: Diagnosis not present

## 2021-07-11 ENCOUNTER — Telehealth (INDEPENDENT_AMBULATORY_CARE_PROVIDER_SITE_OTHER): Payer: Medicare Other | Admitting: Medical

## 2021-07-11 ENCOUNTER — Encounter: Payer: Self-pay | Admitting: Medical

## 2021-07-11 ENCOUNTER — Encounter: Payer: Self-pay | Admitting: Family

## 2021-07-11 ENCOUNTER — Telehealth: Payer: Self-pay

## 2021-07-11 ENCOUNTER — Other Ambulatory Visit: Payer: Self-pay

## 2021-07-11 ENCOUNTER — Telehealth: Payer: Self-pay | Admitting: Medical

## 2021-07-11 VITALS — BP 160/74 | HR 65 | Ht 60.0 in | Wt 82.0 lb

## 2021-07-11 DIAGNOSIS — I10 Essential (primary) hypertension: Secondary | ICD-10-CM

## 2021-07-11 DIAGNOSIS — I25118 Atherosclerotic heart disease of native coronary artery with other forms of angina pectoris: Secondary | ICD-10-CM | POA: Diagnosis not present

## 2021-07-11 MED ORDER — VALSARTAN 160 MG PO TABS: 160.0000 mg | ORAL_TABLET | Freq: Every day | ORAL | 3 refills | Status: AC

## 2021-07-11 MED ORDER — FREESTYLE LIBRE 2 SENSOR MISC: 1.0000 | 11 refills | Status: AC

## 2021-07-11 MED ORDER — NITROGLYCERIN 0.4 MG SL SUBL: 0.4000 mg | SUBLINGUAL_TABLET | SUBLINGUAL | 1 refills | Status: AC | PRN

## 2021-07-11 NOTE — Telephone Encounter (Signed)
Pending consent

## 2021-07-11 NOTE — Patient Instructions (Signed)
Medication Instructions:  Your physician has recommended you make the following change in your medication:   INCREASE valsartan (Diovan) to 160 mg daily   *If you need a refill on your cardiac medications before your next appointment, please call your pharmacy*   Lab Work:  Your physician recommends that you return for lab work (BMET) in: 1 week    If you have labs (blood work) drawn today and your tests are completely normal, you will receive your results only by: MyChart Message (if you have MyChart) OR A paper copy in the mail If you have any lab test that is abnormal or we need to change your treatment, we will call you to review the results.   Testing/Procedures: None ordered   Follow-Up: At [facility], you and your health needs are our priority.  As part of our continuing mission to provide you with exceptional heart care, we have created designated Provider Care Teams.  These Care Teams include your primary Cardiologist (physician) and Advanced Practice Providers (APPs -  Physician Assistants and Nurse Practitioners) who all work together to provide you with the care you need, when you need it.  We recommend signing up for the patient portal called "MyChart".  Sign up information is provided on this After Visit Summary.  MyChart is used to connect with patients for Virtual Visits (Telemedicine).  Patients are able to view lab/test results, encounter notes, upcoming appointments, etc.  Non-urgent messages can be sent to your provider as well.   To learn more about what you can do with MyChart, go to [email or web address].    Your next appointment:   3 month(s)  The format for your next appointment:   In Person  Provider:   You may see Ida Rogue, MD or one of the following Advanced Practice Providers on your designated Care Team:   Murray Hodgkins, NP Christell Faith, PA-C Cadence Kathlen Mody, PA-C1}    Other Instructions N/A

## 2021-07-11 NOTE — Telephone Encounter (Signed)
°  °  Patient Consent for Virtual Visit        Tiffany Bailey has provided verbal consent on 07/11/2021 for a virtual visit (video or telephone).   CONSENT FOR VIRTUAL VISIT FOR:  Tiffany Bailey  By participating in this virtual visit I agree to the following:  I hereby voluntarily request, consent and authorize [facility] and its employed or contracted physicians, physician assistants, nurse practitioners or other licensed health care professionals (the Practitioner), to provide me with telemedicine health care services (the Services") as deemed necessary by the treating Practitioner. I acknowledge and consent to receive the Services by the Practitioner via telemedicine. I understand that the telemedicine visit will involve communicating with the Practitioner through live audiovisual communication technology and the disclosure of certain medical information by electronic transmission. I acknowledge that I have been given the opportunity to request an in-person assessment or other available alternative prior to the telemedicine visit and am voluntarily participating in the telemedicine visit.  I understand that I have the right to withhold or withdraw my consent to the use of telemedicine in the course of my care at any time, without affecting my right to future care or treatment, and that the Practitioner or I may terminate the telemedicine visit at any time. I understand that I have the right to inspect all information obtained and/or recorded in the course of the telemedicine visit and may receive copies of available information for a reasonable fee.  I understand that some of the potential risks of receiving the Services via telemedicine include:  Delay or interruption in medical evaluation due to technological equipment failure or disruption; Information transmitted may not be sufficient (e.g. poor resolution of images) to allow for appropriate medical decision making by the Practitioner;  and/or  In rare instances, security protocols could fail, causing a breach of personal health information.  Furthermore, I acknowledge that it is my responsibility to provide information about my medical history, conditions and care that is complete and accurate to the best of my ability. I acknowledge that Practitioner's advice, recommendations, and/or decision may be based on factors not within their control, such as incomplete or inaccurate data provided by me or distortions of diagnostic images or specimens that may result from electronic transmissions. I understand that the practice of medicine is not an exact science and that Practitioner makes no warranties or guarantees regarding treatment outcomes. I acknowledge that a copy of this consent can be made available to me via my patient portal ([place]), or I can request a printed copy by calling the office of [facility].    I understand that my insurance will be billed for this visit.   I have read or had this consent read to me. I understand the contents of this consent, which adequately explains the benefits and risks of the Services being provided via telemedicine.  I have been provided ample opportunity to ask questions regarding this consent and the Services and have had my questions answered to my satisfaction. I give my informed consent for the services to be provided through the use of telemedicine in my medical care

## 2021-07-11 NOTE — Progress Notes (Signed)
Virtual Visit via Telephone Note   This visit type was conducted due to national recommendations for restrictions regarding the COVID-19 Pandemic (e.g. social distancing) in an effort to limit this patient's exposure and mitigate transmission in our community.  Due to her co-morbid illnesses, this patient is at least at moderate risk for complications without adequate follow up.  This format is felt to be most appropriate for this patient at this time.  The patient did not have access to video technology/had technical difficulties with video requiring transitioning to audio format only (telephone).  All issues noted in this document were discussed and addressed.  No physical exam could be performed with this format.  Please refer to the patient's chart for her  consent to telehealth for [facility].    Date:  07/11/2021   ID:  Tiffany Bailey, DOB 11/26/1927, MRN [id number] The patient was identified using 2 identifiers.  Patient Location: Home Provider Location: Office/Clinic   PCP:  [name], FNP   [facility] Providers Cardiologist:  None     Evaluation Performed:  Follow-Up Visit  Chief Complaint:  3 month follow-up  History of Present Illness:    Tiffany Bailey is a 88 y.o. female with a hx of  with a hx of  CAD s/p CABG, CKD, GERD, PAD s/p left AKA, DM2 on insulin, HTN, arthritis, metastatic breast cancer who presents for follow-up for CAD.    CAD s/p CABG x3 in 2002 (LIMA-LAD, SVG-D1, SVG-RCA). July 2019 echo showed LVEF 55-60%, normal WMA, G1DD, mild to mod MR, mild to mod TR, PASP 64mHg.    Hospitalized July 2020 for PAD and she was followed by [facility] for worsening right-sided PAD with ulceration. Mild disease noted at the common femoral artery with severe disease of profunda femoris artery beyond the primary branches with poor collateral flow. Underwent angioplasty. She was recommended for medical management, walking regimen, antiplatelet agent. 04/2019 she had  repeat studies with ABI 1.5 and TBI 0.7 of LLE.    Seen 03/02/21 and reported high Bps. There was confusing regarding medications with another provider making changes .hydralazine was decreased down to 144mTID.    Admitted 10/14-10/15 for dehydration/AKI. Jaridance, clonidine, and insulin were held.   Last seen 04/04/2021 and was overall doing a lot better.  Patient cannot tolerate Jardiance secondary to worsening kidney function.  No changes were made  Today, the patient reports she is doing better. She is getting stronger. She is in a wheelchair most of the time, no left leg. No chest pain or SOB. She has some burning and stinging pain from cancer. She has stable LLE, she keeps feet propped up on the chair. Mostly dependent edema. No orthopnea or pnd.  She chose a telephone visit because she doesn't get out in the rain. BP at home 160/70s. Can increase hydralazine.   Repeat lipid  The patient does not have symptoms concerning for COVID-19 infection (fever, chills, cough, or new shortness of breath).    Past Medical History:  Diagnosis Date   Anemia    [place]   Arthritis    [place] ([facility]   CKD (chronic kidney disease), stage III ([facility]   Coronary artery disease    a. 2002 CABG x 3 (LIMA-LAD, SVG-D1, SVG-RCA); b. s/p multiple PCI's.   Diabetes mellitus without complication ([facility]   Diastolic dysfunction    a. 12/2017 Echo: EF 55-60%, no rwma, Gr1 DD. Triv AI.  Mild-mod MR/TR. PASP 95mHg.   Edema    Failure to thrive in adult    GERD (gastroesophageal reflux disease)    Hb-SS disease without crisis ([facility] 10/23/2017   History of breast cancer    HOH (hard of hearing)    Hyperlipidemia    Hypertension    Myocardial infarction ([facility]    PAD (peripheral artery disease) ([facility]    a. s/p L AKA; b. 12/2018 PTA R peroneal & PTA/Stenting R SFA.   Palpitations    Past Surgical History:  Procedure Laterality Date   ABDOMINAL HYSTERECTOMY     ABOVE KNEE LEG  AMPUTATION Left 2013   ANTERIOR VITRECTOMY Left 11/16/2015   Procedure: ANTERIOR VITRECTOMY;  Surgeon: BEulogio Bear MD;  Location: [facility] ORS;  Service: Ophthalmology;  Laterality: Left;   BLADDER SURGERY     CATARACT EXTRACTION W/PHACO Left 11/16/2015   Procedure: CATARACT EXTRACTION PHACO AND INTRAOCULAR LENS PLACEMENT (IOC);  Surgeon: BEulogio Bear MD;  Location: [facility] OR[facility];  Service: Ophthalmology;  Laterality: Left;  Lot # [id number] [place] 01:22.8 AP%:12.6 CDE: 10.46   CATARACT EXTRACTION W/PHACO Right 08/22/2016   Procedure: CATARACT EXTRACTION PHACO AND INTRAOCULAR LENS PLACEMENT (IOC);  Surgeon: BEulogio Bear MD;  Location: [facility] OR[facility];  Service: Ophthalmology;  Laterality: Right;  Lot # [id number] [place] 00:52.1 AP%:8.7 CDE: 4.99    CORONARY ANGIOPLASTY     STENT   CORONARY ARTERY BYPASS GRAFT     LOWER EXTREMITY ANGIOGRAPHY Right 01/14/2019   Procedure: LOWER EXTREMITY ANGIOGRAPHY;  Surgeon: DAlgernon Huxley MD;  [facility];  Service: Cardiovascular;  Laterality: Right;   MASTECTOMY     TUMOR REMOVAL     ABDOMINAL     Current Meds  Medication Sig   abemaciclib (VERZENIO) 50 MG tablet Take 1 tablet (50 mg total) by mouth 2 (two) times daily.   amLODipine (NORVASC) 10 MG tablet Take 1 tablet (10 mg total) by mouth daily.   aspirin EC 81 MG tablet Take 1 tablet (81 mg total) by mouth daily.   atorvastatin (LIPITOR) 40 MG tablet TAKE 1 TABLET BY MOUTH  DAILY   blood glucose meter kit and supplies KIT Dispense based on patient and insurance preference. Use up to four times daily as directed. (FOR ICD-9 250.00, 250.01).   Blood Glucose Monitoring Suppl (ONETOUCH VERIO REFLECT) w/Device KIT 1 Device by Does not apply route daily.   cloNIDine (CATAPRES) 0.1 MG tablet Take 1 tablet (0.1 mg total) by mouth 2 (two) times daily.   clopidogrel (PLAVIX) 75 MG tablet TAKE 1 TABLET BY MOUTH  DAILY   Continuous Blood Gluc Receiver (FREESTYLE LIBRE 2 READER) DEVI Use to check  blood sugar continuously   Continuous Blood Gluc Sensor (FREESTYLE LIBRE 2 SENSOR) MISC Apply 1 each topically every 14 (fourteen) days.   feeding supplement (ENSURE ENLIVE / ENSURE PLUS) LIQD Take 237 mLs by mouth 2 (two) times daily between meals.   glucose blood (ONE TOUCH TEST STRIPS) test strip Use to test blood sugar twice daily   hydrALAZINE (APRESOLINE) 10 MG tablet Take 1 tablet (10 mg total) by mouth in the morning and at bedtime.   insulin degludec (TRESIBA FLEXTOUCH) 100 UNIT/ML FlexTouch Pen Inject 6 Units into the skin at bedtime.   Insulin Pen Needle (BD PEN NEEDLE NANO U/F) 32G X 4 MM MISC USE 1 DAILY   Lancets (ONETOUCH ULTRASOFT) lancets 1 each by Other route 2 (two) times daily. 90 day supply for [employer]  Dx E11.9   loperamide (IMODIUM) 2 MG capsule Take 1 capsule (2 mg total) by mouth See admin instructions. Initial: 4 mg, followed by 2 mg after each loose stool; maximum: 16 mg/day   Metoprolol Tartrate 37.5 MG TABS Take 1 tablet by mouth 2 (two) times daily.   ondansetron (ZOFRAN) 4 MG tablet Take 1 tablet (4 mg total) by mouth every 6 (six) hours as needed for nausea or vomiting.   valsartan (DIOVAN) 80 MG tablet Take by mouth.   vitamin C (ASCORBIC ACID) 250 MG tablet Take 250 mg by mouth daily.     Allergies:   Amoxicillin, Gabapentin, Mirtazapine, and Other   Social History   Tobacco Use   Smoking status: Never   Smokeless tobacco: Never  Vaping Use   Vaping Use: Never used  Substance Use Topics   Alcohol use: No   Drug use: No     Family Hx: The patient's family history includes Arthritis in her father; Diabetes in her grandchild; Hypertension in her sister and sister; Stroke in her father; Sudden death in her mother.  ROS:   Please see the history of present illness.     All other systems reviewed and are negative.   Prior CV studies:   The following studies were reviewed today:  Echo 2019 Study Conclusions   - Left ventricle: The  cavity size was normal. Systolic function was    normal. The estimated ejection fraction was in the range of 55%    to 60%. Wall motion was normal; there were no regional wall    motion abnormalities. Doppler parameters are consistent with    abnormal left ventricular relaxation (grade 1 diastolic    dysfunction).  - Aortic valve: There was trivial regurgitation.  - Mitral valve: There was mild to moderate regurgitation.  - Left atrium: The atrium was normal in size.  - Right ventricle: Systolic function was normal.  - Tricuspid valve: There was mild-moderate regurgitation.  - Pulmonary arteries: Systolic pressure was mildly elevated. PA    peak pressure: 44 mm Hg (S).   Impressions:   - Aortic valve sclerosis without significant stenosis.   Labs/Other Tests and Data Reviewed:    EKG:  No ECG reviewed.  Recent Labs: 01/12/2021: TSH 2.859 06/14/2021: ALT 39; BUN 19; Creatinine, Ser 1.26; Hemoglobin 9.3; Platelets 366; Potassium 4.2; Sodium 132   Recent Lipid Panel Lab Results  Component Value Date/Time   CHOL 136 02/26/2019 08:28 AM   TRIG 63.0 02/26/2019 08:28 AM   HDL 46.50 02/26/2019 08:28 AM   CHOLHDL 3 02/26/2019 08:28 AM   LDLCALC 76 02/26/2019 08:28 AM    Wt Readings from Last 3 Encounters:  07/11/21 82 lb (37.2 kg)  06/14/21 85 lb 4.8 oz (38.7 kg)  06/07/21 95 lb (43.1 kg)         Objective:    Vital Signs:  BP (!) 160/74    Pulse 65    Ht 5' (1.524 m)    Wt 82 lb (37.2 kg)    BMI 16.01 kg/m    VITAL SIGNS:  reviewed  ASSESSMENT & PLAN:    CAD status post CABG x3 Patient denies chest pain or shortness of breath. No ischemic testing indicated at this time. Contine Aspirin, Plavix, BB, statin.  Hypertension BP high today, she reports it has been persistently high. I will increase valsartan to 116m daily. BMET in a week. Continue amlodipine, clonidine, hydrlazine, Lopressor.  Hyperlipidemia LDL 76 02/2019. Needs re-check lipids at  follow-up. Continue  statin.   PAD status post left AKA No signs of worsening claudication however patient mostly sedentary in a wheelchair. Continue secondary prevention with Aspirin, statin, plavix.   CKD Most recent labs showed essentially normal Scr/BUN. Repeat BMET in a week.   Diabetes type 2 A1C 8.6 01/2021. Follows with PCP.     COVID-19 Education: The signs and symptoms of COVID-19 were discussed with the patient and how to seek care for testing (follow up with PCP or arrange E-visit).  The importance of social distancing was discussed today.  Time:   Today, I have spent 25 minutes with the patient with telehealth technology discussing the above problems.     Medication Adjustments/Labs and Tests Ordered: Current medicines are reviewed at length with the patient today.  Concerns regarding medicines are outlined above.   Tests Ordered: No orders of the defined types were placed in this encounter.   Medication Changes: No orders of the defined types were placed in this encounter.   Follow Up:  In Person in 3 month(s)  Signed, Cadence Arlyss Repress  07/11/2021 10:11 AM    [place]

## 2021-07-11 NOTE — Telephone Encounter (Signed)
Pending consent.  Attempted to obtain now but no ans no vm   Daughter calling to change to virtual due to weather .   Patient will check vitals prior to call.

## 2021-07-12 ENCOUNTER — Encounter: Payer: Self-pay | Admitting: Oncology

## 2021-07-12 ENCOUNTER — Inpatient Hospital Stay (HOSPITAL_BASED_OUTPATIENT_CLINIC_OR_DEPARTMENT_OTHER): Payer: Medicare Other | Admitting: Oncology

## 2021-07-12 ENCOUNTER — Inpatient Hospital Stay: Payer: Medicare Other

## 2021-07-12 ENCOUNTER — Other Ambulatory Visit: Payer: Self-pay

## 2021-07-12 VITALS — BP 154/55 | HR 66 | Temp 97.1°F | Resp 16 | Wt 87.7 lb

## 2021-07-12 DIAGNOSIS — T451X5A Adverse effect of antineoplastic and immunosuppressive drugs, initial encounter: Secondary | ICD-10-CM

## 2021-07-12 DIAGNOSIS — Z5111 Encounter for antineoplastic chemotherapy: Secondary | ICD-10-CM

## 2021-07-12 DIAGNOSIS — E1122 Type 2 diabetes mellitus with diabetic chronic kidney disease: Secondary | ICD-10-CM | POA: Diagnosis not present

## 2021-07-12 DIAGNOSIS — M8580 Other specified disorders of bone density and structure, unspecified site: Secondary | ICD-10-CM | POA: Diagnosis not present

## 2021-07-12 DIAGNOSIS — Z9013 Acquired absence of bilateral breasts and nipples: Secondary | ICD-10-CM | POA: Diagnosis not present

## 2021-07-12 DIAGNOSIS — D571 Sickle-cell disease without crisis: Secondary | ICD-10-CM | POA: Diagnosis not present

## 2021-07-12 DIAGNOSIS — D6481 Anemia due to antineoplastic chemotherapy: Secondary | ICD-10-CM | POA: Diagnosis not present

## 2021-07-12 DIAGNOSIS — D631 Anemia in chronic kidney disease: Secondary | ICD-10-CM | POA: Diagnosis not present

## 2021-07-12 DIAGNOSIS — N1831 Chronic kidney disease, stage 3a: Secondary | ICD-10-CM

## 2021-07-12 DIAGNOSIS — Z79811 Long term (current) use of aromatase inhibitors: Secondary | ICD-10-CM

## 2021-07-12 DIAGNOSIS — C50919 Malignant neoplasm of unspecified site of unspecified female breast: Secondary | ICD-10-CM

## 2021-07-12 DIAGNOSIS — J90 Pleural effusion, not elsewhere classified: Secondary | ICD-10-CM | POA: Diagnosis not present

## 2021-07-12 DIAGNOSIS — Z89612 Acquired absence of left leg above knee: Secondary | ICD-10-CM | POA: Diagnosis not present

## 2021-07-12 DIAGNOSIS — I252 Old myocardial infarction: Secondary | ICD-10-CM | POA: Diagnosis not present

## 2021-07-12 DIAGNOSIS — T451X5D Adverse effect of antineoplastic and immunosuppressive drugs, subsequent encounter: Secondary | ICD-10-CM | POA: Diagnosis not present

## 2021-07-12 DIAGNOSIS — I129 Hypertensive chronic kidney disease with stage 1 through stage 4 chronic kidney disease, or unspecified chronic kidney disease: Secondary | ICD-10-CM | POA: Diagnosis not present

## 2021-07-12 DIAGNOSIS — Z803 Family history of malignant neoplasm of breast: Secondary | ICD-10-CM | POA: Diagnosis not present

## 2021-07-12 LAB — CBC WITH DIFFERENTIAL/PLATELET
Abs Immature Granulocytes: 0.02 10*3/uL (ref 0.00–0.07)
Basophils Absolute: 0.1 10*3/uL (ref 0.0–0.1)
Basophils Relative: 1 %
Eosinophils Absolute: 0.1 10*3/uL (ref 0.0–0.5)
Eosinophils Relative: 2 %
HCT: 30.6 % — ABNORMAL LOW (ref 36.0–46.0)
Hemoglobin: 10.1 g/dL — ABNORMAL LOW (ref 12.0–15.0)
Immature Granulocytes: 0 %
Lymphocytes Relative: 23 %
Lymphs Abs: 1.3 10*3/uL (ref 0.7–4.0)
MCH: 32.3 pg (ref 26.0–34.0)
MCHC: 33 g/dL (ref 30.0–36.0)
MCV: 97.8 fL (ref 80.0–100.0)
Monocytes Absolute: 0.6 10*3/uL (ref 0.1–1.0)
Monocytes Relative: 9 %
Neutro Abs: 3.8 10*3/uL (ref 1.7–7.7)
Neutrophils Relative %: 65 %
Platelets: 269 10*3/uL (ref 150–400)
RBC: 3.13 MIL/uL — ABNORMAL LOW (ref 3.87–5.11)
RDW: 13.3 % (ref 11.5–15.5)
WBC: 5.8 10*3/uL (ref 4.0–10.5)
nRBC: 0 % (ref 0.0–0.2)

## 2021-07-12 LAB — COMPREHENSIVE METABOLIC PANEL
ALT: 28 U/L (ref 0–44)
AST: 31 U/L (ref 15–41)
Albumin: 3.7 g/dL (ref 3.5–5.0)
Alkaline Phosphatase: 95 U/L (ref 38–126)
Anion gap: 6 (ref 5–15)
BUN: 29 mg/dL — ABNORMAL HIGH (ref 8–23)
CO2: 24 mmol/L (ref 22–32)
Calcium: 8.9 mg/dL (ref 8.9–10.3)
Chloride: 104 mmol/L (ref 98–111)
Creatinine, Ser: 1.13 mg/dL — ABNORMAL HIGH (ref 0.44–1.00)
GFR, Estimated: 46 mL/min — ABNORMAL LOW (ref >60)
Glucose, Bld: 208 mg/dL — ABNORMAL HIGH (ref 70–99)
Potassium: 4.4 mmol/L (ref 3.5–5.1)
Sodium: 134 mmol/L — ABNORMAL LOW (ref 135–145)
Total Bilirubin: 0.4 mg/dL (ref 0.3–1.2)
Total Protein: 7 g/dL (ref 6.5–8.1)

## 2021-07-12 MED ORDER — FULVESTRANT 250 MG/5ML IM SOSY
500.0000 mg | PREFILLED_SYRINGE | Freq: Once | INTRAMUSCULAR | Status: AC
  Administered 2021-07-12: 500 mg via INTRAMUSCULAR
  Filled 2021-07-12: qty 10

## 2021-07-12 NOTE — Progress Notes (Signed)
Pt and family member in for follow up.  Pt reports she is not sure of medications she takes, the caregiver who oversees is not here today. Pt reports having no appetite and requests something to help with it. Pt also reports increased headaches.

## 2021-07-12 NOTE — Progress Notes (Signed)
Hematology/Oncology  Follow up note [phone] [phone]   Patient Care Team: [name], FNP as PCP - General (Family Medicine) Jason Coop, NP as Nurse Practitioner (Hospice and Palliative Medicine) [name], MD as Consulting Physician (Oncology) De Hollingshead, RPH-CPP (Pharmacist) Minna Merritts, MD as Consulting Physician (Cardiology) Lauretta Grill, NP as Nurse Practitioner (Nurse Practitioner) Borders, [name], NP as Nurse Practitioner (Hospice and Palliative Medicine)  REFERRING PROVIDER: [name], FNP  CHIEF COMPLAINTS/REASON FOR VISIT:  Follow up for metastatic breast cancer  HISTORY OF PRESENTING ILLNESS:   Tiffany Bailey is a  88 y.o.  female with PMH listed below was seen in consultation at the request of  [name], FNP  for evaluation of abnormal CT scan. Patient was recently evaluated by primary care provider for complaints of urinary retention, headache, constipation. 02/03/2019 x-ray of abdomen showed large colonic stool volume, nodular densities noted in the right mid lung are up). 02/08/2019 subsequent chest x-ray two-view showed hyperinflation of the lungs compatible with COPD.  Multiple nodular densities project over the mid and lower right lung. CT chest was obtained for further evaluation. 02/17/2019 CT chest without contrast Showed multiple bilateral pulmonary nodules,groundglass opacities. Largest solid nodule at the confluence of the right major and minor fissures measuring 1.6 cm.  Most notable groundglass nodule is of the right upper lobe and measure 1.8 x 0.8 cm, small right pleural effusion with associated atelectasis or consolidation.  Larger solid nodule are highly suspicious for metastatic disease. Bulky right mediastinal and hilar lymph node.  Largest pretracheal nodes measuring 3.5 x 3 cm.  There are additional bulky superior mediastinal, supraclavicular, left subpectoral lymph nodes.   Largest subpectoral nodes measuring 3.3 x 2.5 cm.  Patient was sent to cancer center for further evaluation.  #Patient reports a history of left breast cancer status post mastectomy.  She is a poor historian.  She is not able to recall the timeframe of breast cancer diagnosis and then treatments.  Denies any chemotherapy or radiation treatments.  #Left lower extremity history of amputation, CKD, History of MI, Sickle cell disease, DM, HTN, anemia.   Patient denies shortness of breath, chest pain, hemoptysis.  Night sweating, abdominal pain. Reports intentional weight loss.  Appetite is poor.  Not eating much. Also have wax and wane, chronic intermittent headache, she takes Tylenol as needed, with some relief.  Denies any nausea vomiting. She sees neurology. 11/10/2018 CT head showed no acute intracranial abnormality or significant interval changes.  Stable atrophy and white matter disease.  Atherosclerosis.  #Patient had a consultation visit with me on 02/19/2019.  At that time he was referred for abnormal CT scan.  PET scan was obtained and I recommend patient to obtain left axillary lymph node biopsy.  Patient/family called back and decided not to proceed with any additional work-up.  #February 2020 started Arimidex # 03/30/2021 - 03/31/2021 Admission due to AKI, felt to be due to dehydration, side effects of Jardiance.  Her glucose has been low and diabetes regimen has been adjusted. [name] was held.   INTERVAL HISTORY Tiffany Bailey is a 88 y.o. female who has above history reviewed by me today presents for follow up visit for management of metastatic breast cancer. Patient was accompanied by her son. At the last visit, I recommend patient to hold abemaciclib for additional 2 weeks and then restart abemaciclib at a lower dose and 50 mg twice daily.  Patient reports that she has  not restarted yet. She feels well today.  No new complaints.  . Review of Systems  Constitutional:  Positive for  fatigue. Negative for appetite change, chills, fever and unexpected weight change.  HENT:   Negative for hearing loss and voice change.   Eyes:  Negative for eye problems.  Respiratory:  Negative for chest tightness, cough and shortness of breath.   Cardiovascular:  Negative for chest pain.  Gastrointestinal:  Negative for abdominal distention, abdominal pain, blood in stool and constipation.  Endocrine: Negative for hot flashes.  Genitourinary:  Negative for difficulty urinating and frequency.   Musculoskeletal:  Negative for arthralgias.       Left lower extremity history of amputation  Skin:  Negative for itching and rash.  Neurological:  Negative for extremity weakness and headaches.  Hematological:  Negative for adenopathy.  Psychiatric/Behavioral:  Negative for confusion.    MEDICAL HISTORY:  Past Medical History:  Diagnosis Date   Anemia    Anginal pain ([place])    Arthritis    Breast cancer ([place]) 07/19/2019   CKD (chronic kidney disease), stage III ([place])    Coronary artery disease    a. 2002 CABG x 3 (LIMA-LAD, SVG-D1, SVG-RCA); b. s/p multiple PCI's.   Diabetes mellitus without complication ([place])    Diastolic dysfunction    a. 12/2017 Echo: EF 55-60%, no rwma, Gr1 DD. Triv AI. Mild-mod MR/TR. PASP 13mHg.   Edema    Failure to thrive in adult    GERD (gastroesophageal reflux disease)    Hb-SS disease without crisis ([facility] 10/23/2017   History of breast cancer    HOH (hard of hearing)    Hyperlipidemia    Hypertension    Myocardial infarction ([facility]    PAD (peripheral artery disease) ([facility]    a. s/p L AKA; b. 12/2018 PTA R peroneal & PTA/Stenting R SFA.   Palpitations     SURGICAL HISTORY: Past Surgical History:  Procedure Laterality Date   ABDOMINAL HYSTERECTOMY     ABOVE KNEE LEG AMPUTATION Left 2013   ANTERIOR VITRECTOMY Left 11/16/2015   Procedure: ANTERIOR VITRECTOMY;  Surgeon: BEulogio Bear MD;  Location: [facility] ORS;  Service: Ophthalmology;  Laterality: Left;    BLADDER SURGERY     CATARACT EXTRACTION W/PHACO Left 11/16/2015   Procedure: CATARACT EXTRACTION PHACO AND INTRAOCULAR LENS PLACEMENT (IOC);  Surgeon: BEulogio Bear MD;  Location: [facility] ORS;  Service: Ophthalmology;  Laterality: Left;  Lot # [id number] [place] 01:22.8 AP%:12.6 CDE: 10.46   CATARACT EXTRACTION W/PHACO Right 08/22/2016   Procedure: CATARACT EXTRACTION PHACO AND INTRAOCULAR LENS PLACEMENT (IOC);  Surgeon: BEulogio Bear MD;  Location: [facility] ORS;  Service: Ophthalmology;  Laterality: Right;  Lot # [id number] [place] 00:52.1 AP%:8.7 CDE: 4.99    CORONARY ANGIOPLASTY     STENT   CORONARY ARTERY BYPASS GRAFT     LOWER EXTREMITY ANGIOGRAPHY Right 01/14/2019   Procedure: LOWER EXTREMITY ANGIOGRAPHY;  Surgeon: DAlgernon Huxley MD;  [facility];  Service: Cardiovascular;  Laterality: Right;   MASTECTOMY     TUMOR REMOVAL     ABDOMINAL    SOCIAL HISTORY: Social History   Socioeconomic History   Marital status: Divorced    Spouse name: Not on file   Number of children: 7   Years of education: Not on file   Highest education level: Not on file  Occupational History   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  Tobacco Use   Smoking status: Never  Smokeless tobacco: Never  Vaping Use   Vaping Use: Never used  Substance and Sexual Activity   Alcohol use: No   Drug use: No   Sexual activity: Not Currently  Other Topics Concern   Not on file  Social History Narrative   Not on file   Social Determinants of Health   Financial Resource Strain: Low Risk    Difficulty of Paying Living Expenses: Not hard at all  Food Insecurity: Not on file  Transportation Needs: Not on file  Physical Activity: Not on file  Stress: Not on file  Social Connections: Not on file  Intimate Partner Violence: Not on file    FAMILY HISTORY: Family History  Problem Relation Age of Onset   Sudden death Mother    Arthritis Father    Stroke Father    Hypertension Sister     Hypertension Sister    Diabetes Grandchild     ALLERGIES:  is allergic to amoxicillin, gabapentin, mirtazapine, and other.  MEDICATIONS:  Current Outpatient Medications  Medication Sig Dispense Refill   abemaciclib (VERZENIO) 50 MG tablet Take 1 tablet (50 mg total) by mouth 2 (two) times daily. (Patient not taking: Reported on 07/12/2021) 56 tablet 0   amLODipine (NORVASC) 10 MG tablet Take 1 tablet (10 mg total) by mouth daily. 90 tablet 1   aspirin EC 81 MG tablet Take 1 tablet (81 mg total) by mouth daily. 30 tablet 5   atorvastatin (LIPITOR) 40 MG tablet TAKE 1 TABLET BY MOUTH  DAILY 90 tablet 3   blood glucose meter kit and supplies KIT Dispense based on patient and insurance preference. Use up to four times daily as directed. (FOR ICD-9 250.00, 250.01). 1 each 0   Blood Glucose Monitoring Suppl (ONETOUCH VERIO REFLECT) w/Device KIT 1 Device by Does not apply route daily. 1 kit 0   cloNIDine (CATAPRES) 0.1 MG tablet Take 1 tablet (0.1 mg total) by mouth 2 (two) times daily. 60 tablet 2   clopidogrel (PLAVIX) 75 MG tablet TAKE 1 TABLET BY MOUTH  DAILY 30 tablet 0   Continuous Blood Gluc Receiver (FREESTYLE LIBRE 2 READER) DEVI Use to check blood sugar continuously 1 each 1   Continuous Blood Gluc Sensor (FREESTYLE LIBRE 2 SENSOR) MISC Apply 1 each topically every 14 (fourteen) days. 2 each 11   feeding supplement (ENSURE ENLIVE / ENSURE PLUS) LIQD Take 237 mLs by mouth 2 (two) times daily between meals.     glucose blood (ONE TOUCH TEST STRIPS) test strip Use to test blood sugar twice daily 200 each 3   hydrALAZINE (APRESOLINE) 10 MG tablet Take 1 tablet (10 mg total) by mouth in the morning and at bedtime. 180 tablet 3   insulin degludec (TRESIBA FLEXTOUCH) 100 UNIT/ML FlexTouch Pen Inject 6 Units into the skin at bedtime.     Insulin Pen Needle (BD PEN NEEDLE NANO U/F) 32G X 4 MM MISC USE 1 DAILY 100 each 12   Lancets (ONETOUCH ULTRASOFT) lancets 1 each by Other route 2 (two) times  daily. 90 day supply for [employer].  Dx E11.9 300 each 12   loperamide (IMODIUM) 2 MG capsule Take 1 capsule (2 mg total) by mouth See admin instructions. Initial: 4 mg, followed by 2 mg after each loose stool; maximum: 16 mg/day 60 capsule 1   Metoprolol Tartrate 37.5 MG TABS Take 1 tablet by mouth 2 (two) times daily.     nitroGLYCERIN (NITROSTAT) 0.4 MG SL tablet Place 1 tablet (0.4  mg total) under the tongue every 5 (five) minutes as needed for chest pain. 25 tablet 1   ondansetron (ZOFRAN) 4 MG tablet Take 1 tablet (4 mg total) by mouth every 6 (six) hours as needed for nausea or vomiting. 60 tablet 1   valsartan (DIOVAN) 160 MG tablet Take 1 tablet (160 mg total) by mouth daily. 90 tablet 3   vitamin C (ASCORBIC ACID) 250 MG tablet Take 250 mg by mouth daily.     No current facility-administered medications for this visit.     PHYSICAL EXAMINATION: ECOG PERFORMANCE STATUS: 2 - Symptomatic, <50% confined to bed Vitals:   07/12/21 1342  BP: (!) 154/55  Pulse: 66  Resp: 16  Temp: (!) 97.1 F (36.2 C)  SpO2: 100%   Filed Weights   07/12/21 1342  Weight: 87 lb 11.2 oz (39.8 kg)    Physical Exam Constitutional:      General: She is not in acute distress.    Comments: She sits in the wheel chair.  Frail appearance  HENT:     Head: Normocephalic and atraumatic.  Eyes:     General: No scleral icterus.    Pupils: Pupils are equal, round, and reactive to light.  Cardiovascular:     Rate and Rhythm: Normal rate and regular rhythm.     Heart sounds: Normal heart sounds.  Pulmonary:     Effort: Pulmonary effort is normal. No respiratory distress.     Breath sounds: No wheezing.     Comments: Decreased breath sound right lower lobe.  Good air entry on the left base.  Abdominal:     General: Bowel sounds are normal. There is no distension.     Palpations: Abdomen is soft.  Musculoskeletal:        General: No deformity. Normal range of motion.     Cervical back:  Normal range of motion and neck supple.     Comments: Left lower extremity history of amputation.   Skin:    General: Skin is warm and dry.     Findings: No erythema or rash.  Neurological:     Mental Status: She is alert and oriented to person, place, and time. Mental status is at baseline.     Cranial Nerves: No cranial nerve deficit.     Coordination: Coordination normal.  Psychiatric:        Mood and Affect: Mood normal.     LABORATORY DATA:  I have reviewed the data as listed Lab Results  Component Value Date   WBC 5.8 07/12/2021   HGB 10.1 (L) 07/12/2021   HCT 30.6 (L) 07/12/2021   MCV 97.8 07/12/2021   PLT 269 07/12/2021   Recent Labs    03/30/21 1650 03/31/21 0428 05/24/21 1414 06/14/21 1326 07/12/21 1305  NA  --    < > 137 132* 134*  K  --    < > 3.8 4.2 4.4  CL  --    < > 105 99 104  CO2  --    < > 21* 23 24  GLUCOSE  --    < > 217* 335* 208*  BUN  --    < > 14 19 29*  CREATININE  --    < > 1.36* 1.26* 1.13*  CALCIUM  --    < > 8.5* 8.8* 8.9  GFRNONAA  --    < > 37* 40* 46*  PROT 6.5   < > 5.7* 6.4* 7.0  ALBUMIN 3.1*   < >  3.0* 3.3* 3.7  AST 165*   < > 41 35 31  ALT 167*   < > 21 39 28  ALKPHOS 89   < > 88 79 95  BILITOT 0.7   < > 0.7 0.5 0.4  BILIDIR 0.1  --   --   --   --   IBILI 0.6  --   --   --   --    < > = values in this interval not displayed.    Iron/TIBC/Ferritin/ %Sat    Component Value Date/Time   IRON 33 01/12/2021 1133   TIBC 220 (L) 01/12/2021 1133   FERRITIN 74 01/12/2021 1133   IRONPCTSAT 15 01/12/2021 1133   IRONPCTSAT 27 06/19/2018 1006      RADIOGRAPHIC STUDIES: I have personally reviewed the radiological images as listed and agreed with the findings in the report. CT CHEST ABDOMEN PELVIS WO CONTRAST  Result Date: 07/06/2021 CLINICAL DATA:  Metastatic breast cancer EXAM: CT CHEST, ABDOMEN AND PELVIS WITHOUT CONTRAST TECHNIQUE: Multidetector CT imaging of the chest, abdomen and pelvis was performed following the standard  protocol without IV contrast. RADIATION DOSE REDUCTION: This exam was performed according to the departmental dose-optimization program which includes automated exposure control, adjustment of the mA and/or kV according to patient size and/or use of iterative reconstruction technique. COMPARISON:  03/01/2021 FINDINGS: CT CHEST FINDINGS Cardiovascular: Aortic atherosclerosis. Normal heart size. Extensive 3 vessel coronary artery calcifications and stents. No pericardial effusion. Mediastinum/Nodes: Interval decrease in size of a pretracheal lymph node, measuring 3.0 x 2.4 cm, previously 3.5 x 2.8 cm (series 2, image 26). Interval decrease in size of a subcarinal lymph node measuring 2.5 x 1.8 cm, previously 2.7 x 2.4 cm (series 2, image 30). Thyroid gland, trachea, and esophagus demonstrate no significant findings. Lungs/Pleura: Unchanged small right pleural effusion. Diffuse bilateral bronchial wall thickening. Multiple bilateral ground-glass and subsolid pulmonary nodules are unchanged, largest subsolid nodule of the right upper lobe measuring 1.7 x 1.1 cm (series 3, image 69). Additional ground-glass opacity of the lingula measures 1.4 x 0.9 cm, unchanged (series 3, image 75). Multiple solid pulmonary nodules of the right lung are unchanged, index nodule of the medial segment right middle lobe measuring 0.7 x 0.5 cm (series 3, image 103), fissural nodule of the posterior right upper lobe measuring 0.7 x 0.5 cm (series 3, image 54). Musculoskeletal: No chest wall mass or suspicious osseous lesions identified. CT ABDOMEN PELVIS FINDINGS Hepatobiliary: No solid liver abnormality is seen. Status post cholecystectomy. No gallbladder wall thickening, or biliary dilatation. Pancreas: Unremarkable. No pancreatic ductal dilatation or surrounding inflammatory changes. Spleen: Normal in size without significant abnormality. Adrenals/Urinary Tract: Adrenal glands are unremarkable. Numerous bilateral renal calculi and or renal  vascular calcifications. No hydronephrosis. Bladder is unremarkable. Stomach/Bowel: Stomach is within normal limits. Appendix appears normal. No evidence of bowel wall thickening, distention, or inflammatory changes. Vascular/Lymphatic: No significant vascular findings are present. No enlarged abdominal or pelvic lymph nodes. Reproductive: Status post hysterectomy. Other: No abdominal wall hernia or abnormality. No ascites. Musculoskeletal: No acute osseous findings. Unchanged superior endplate deformity of L5 (series 6, image 83). IMPRESSION: 1. Interval decrease in size of pretracheal and subcarinal lymph nodes, consistent with treatment response of nodal metastatic disease. 2. Multiple multiple solid nodules of the right lung are unchanged. 3. Additional bilateral ground-glass and subsolid pulmonary nodules are unchanged. 4. Unchanged small right pleural effusion. 5. No noncontrast CT evidence of new metastatic disease. 6. Numerous bilateral renal calculi and or renal  vascular calcifications. No hydronephrosis. Aortic Atherosclerosis (ICD10-I70.0). Electronically Signed   By: Delanna Ahmadi M.D.   On: 07/06/2021 17:22      ASSESSMENT & PLAN:  1. Metastatic breast cancer ([place])   2. Anemia due to antineoplastic chemotherapy   3. Encounter for antineoplastic chemotherapy   4. Stage 3a chronic kidney disease ([facility])     #Metastatic breast cancer,  ER+, PR- HER2 negative.  Labs reviewed and discussed with patient. Counts are stable. Recommend patient to resume abemaciclib, dose reduction to 50 mg twice daily. 07/06/2021, CT chest abdomen pelvis without contrast showed interval decrease in size of pretracheal and subcarinal lymph nodes.  Consistent with treatment response.  Multiple solid nodules in right lungs are unchanged.  Unchanged small right pleural effusion.  No new metastatic disease.  Numerous bilateral renal calculi and/or renal vascular calcifications.  No hydronephrosis. Continue fulvestrant  monthly.  Proceed with injection today.  #uncontrolled brittle diabetes.follow up with pcp.  Recommend patient to discuss with PCP if she should resume insulin. #Chronic kidney disease, avoid nephrotoxin.  Encourage oral hydration. #Anemia due to chronic kidney disease/chemotherapy.  Hemoglobin 10.1.  Stable.  Monitor.  All questions were answered. The patient knows to call the clinic with any problems questions or concerns.   Follow up in 4 weeks, Lab MD + [place]  [name], MD, PhD 07/12/2021

## 2021-07-13 ENCOUNTER — Ambulatory Visit: Payer: Medicare Other

## 2021-07-17 ENCOUNTER — Other Ambulatory Visit (HOSPITAL_COMMUNITY): Payer: Self-pay

## 2021-07-17 DIAGNOSIS — I10 Essential (primary) hypertension: Secondary | ICD-10-CM | POA: Diagnosis not present

## 2021-07-17 DIAGNOSIS — I25118 Atherosclerotic heart disease of native coronary artery with other forms of angina pectoris: Secondary | ICD-10-CM | POA: Diagnosis not present

## 2021-07-17 DIAGNOSIS — Z794 Long term (current) use of insulin: Secondary | ICD-10-CM | POA: Diagnosis not present

## 2021-07-17 DIAGNOSIS — E1159 Type 2 diabetes mellitus with other circulatory complications: Secondary | ICD-10-CM

## 2021-07-19 ENCOUNTER — Inpatient Hospital Stay: Payer: Medicare Other | Attending: Oncology

## 2021-07-19 DIAGNOSIS — Z89612 Acquired absence of left leg above knee: Secondary | ICD-10-CM | POA: Insufficient documentation

## 2021-07-19 DIAGNOSIS — T451X5D Adverse effect of antineoplastic and immunosuppressive drugs, subsequent encounter: Secondary | ICD-10-CM | POA: Insufficient documentation

## 2021-07-19 DIAGNOSIS — E1122 Type 2 diabetes mellitus with diabetic chronic kidney disease: Secondary | ICD-10-CM | POA: Insufficient documentation

## 2021-07-19 DIAGNOSIS — I129 Hypertensive chronic kidney disease with stage 1 through stage 4 chronic kidney disease, or unspecified chronic kidney disease: Secondary | ICD-10-CM | POA: Insufficient documentation

## 2021-07-19 DIAGNOSIS — Z9013 Acquired absence of bilateral breasts and nipples: Secondary | ICD-10-CM | POA: Insufficient documentation

## 2021-07-19 DIAGNOSIS — J90 Pleural effusion, not elsewhere classified: Secondary | ICD-10-CM | POA: Insufficient documentation

## 2021-07-19 DIAGNOSIS — D571 Sickle-cell disease without crisis: Secondary | ICD-10-CM | POA: Insufficient documentation

## 2021-07-19 DIAGNOSIS — N1831 Chronic kidney disease, stage 3a: Secondary | ICD-10-CM | POA: Insufficient documentation

## 2021-07-19 DIAGNOSIS — D6481 Anemia due to antineoplastic chemotherapy: Secondary | ICD-10-CM | POA: Insufficient documentation

## 2021-07-19 DIAGNOSIS — C50919 Malignant neoplasm of unspecified site of unspecified female breast: Secondary | ICD-10-CM | POA: Insufficient documentation

## 2021-07-19 DIAGNOSIS — Z79811 Long term (current) use of aromatase inhibitors: Secondary | ICD-10-CM | POA: Insufficient documentation

## 2021-07-19 DIAGNOSIS — M8580 Other specified disorders of bone density and structure, unspecified site: Secondary | ICD-10-CM | POA: Insufficient documentation

## 2021-07-19 DIAGNOSIS — D631 Anemia in chronic kidney disease: Secondary | ICD-10-CM | POA: Insufficient documentation

## 2021-07-19 DIAGNOSIS — I252 Old myocardial infarction: Secondary | ICD-10-CM | POA: Insufficient documentation

## 2021-07-19 DIAGNOSIS — Z803 Family history of malignant neoplasm of breast: Secondary | ICD-10-CM | POA: Insufficient documentation

## 2021-07-19 NOTE — Progress Notes (Signed)
Nutrition  Called and spoke with daughter [name] (highlighted phone number in chart) but patient not with her at the time.  [name] asked that I call patient's number.  RD called and no answer. No option to leave voicemail as no mail box set up.    RD tired calling patient again at 3:52pm.  No answer or option to leave voicemail.  Jack Mineau B. Zenia Resides, [place], [place] Registered Dietitian [phone] (mobile)

## 2021-07-20 ENCOUNTER — Other Ambulatory Visit: Payer: Medicare Other

## 2021-07-24 DIAGNOSIS — I10 Essential (primary) hypertension: Secondary | ICD-10-CM | POA: Diagnosis not present

## 2021-07-25 ENCOUNTER — Encounter: Payer: Self-pay | Admitting: Family

## 2021-07-26 ENCOUNTER — Other Ambulatory Visit: Payer: Self-pay

## 2021-07-26 ENCOUNTER — Other Ambulatory Visit (HOSPITAL_COMMUNITY): Payer: Self-pay

## 2021-07-26 ENCOUNTER — Telehealth: Payer: Self-pay

## 2021-07-26 NOTE — Telephone Encounter (Signed)
Nutrition  RD tried to call patient for nutrition follow-up but no answer or option to leave voicemail.   [name], [place], [place] Registered Dietitian [phone] (mobile)

## 2021-07-27 ENCOUNTER — Encounter: Payer: Self-pay | Admitting: Oncology

## 2021-07-27 ENCOUNTER — Other Ambulatory Visit (HOSPITAL_COMMUNITY): Payer: Self-pay

## 2021-07-27 NOTE — Telephone Encounter (Signed)
Josh/ blair, Dr. Tasia Catchings would patient evaluated for symptoms caused by Verzenio. Not sure if you want to do virtual or in person?

## 2021-07-27 NOTE — Telephone Encounter (Signed)
Please advise 

## 2021-07-27 NOTE — Telephone Encounter (Signed)
Contacted pt's daughter, [name]. Informed her that per Dr. Tasia Catchings, pt should stop the verzinio and be evaluated in clinic. [name] stated that pt has only had one instance of diarrhea, which was last night, but pt did not have a way to get to clinic today. Appointment offered for Monday, and daughter accepted. She verbalized understanding to stop the medication and will encourage oral hydration for her mother.

## 2021-07-30 ENCOUNTER — Inpatient Hospital Stay: Payer: Medicare Other

## 2021-07-30 ENCOUNTER — Other Ambulatory Visit: Payer: Self-pay

## 2021-07-30 ENCOUNTER — Inpatient Hospital Stay (HOSPITAL_BASED_OUTPATIENT_CLINIC_OR_DEPARTMENT_OTHER): Payer: Medicare Other | Admitting: Hospice and Palliative Medicine

## 2021-07-30 VITALS — BP 146/45 | HR 48 | Temp 96.2°F | Resp 49

## 2021-07-30 DIAGNOSIS — Z9013 Acquired absence of bilateral breasts and nipples: Secondary | ICD-10-CM | POA: Diagnosis not present

## 2021-07-30 DIAGNOSIS — G8929 Other chronic pain: Secondary | ICD-10-CM

## 2021-07-30 DIAGNOSIS — C50919 Malignant neoplasm of unspecified site of unspecified female breast: Secondary | ICD-10-CM

## 2021-07-30 DIAGNOSIS — D571 Sickle-cell disease without crisis: Secondary | ICD-10-CM | POA: Diagnosis not present

## 2021-07-30 DIAGNOSIS — I252 Old myocardial infarction: Secondary | ICD-10-CM | POA: Diagnosis not present

## 2021-07-30 DIAGNOSIS — E860 Dehydration: Secondary | ICD-10-CM

## 2021-07-30 DIAGNOSIS — T451X5D Adverse effect of antineoplastic and immunosuppressive drugs, subsequent encounter: Secondary | ICD-10-CM | POA: Diagnosis not present

## 2021-07-30 DIAGNOSIS — R519 Headache, unspecified: Secondary | ICD-10-CM

## 2021-07-30 DIAGNOSIS — M8580 Other specified disorders of bone density and structure, unspecified site: Secondary | ICD-10-CM | POA: Diagnosis not present

## 2021-07-30 DIAGNOSIS — I129 Hypertensive chronic kidney disease with stage 1 through stage 4 chronic kidney disease, or unspecified chronic kidney disease: Secondary | ICD-10-CM | POA: Diagnosis not present

## 2021-07-30 DIAGNOSIS — Z79811 Long term (current) use of aromatase inhibitors: Secondary | ICD-10-CM | POA: Diagnosis not present

## 2021-07-30 DIAGNOSIS — Z803 Family history of malignant neoplasm of breast: Secondary | ICD-10-CM | POA: Diagnosis not present

## 2021-07-30 DIAGNOSIS — J90 Pleural effusion, not elsewhere classified: Secondary | ICD-10-CM | POA: Diagnosis not present

## 2021-07-30 DIAGNOSIS — D6481 Anemia due to antineoplastic chemotherapy: Secondary | ICD-10-CM | POA: Diagnosis not present

## 2021-07-30 DIAGNOSIS — D631 Anemia in chronic kidney disease: Secondary | ICD-10-CM | POA: Diagnosis not present

## 2021-07-30 DIAGNOSIS — Z89612 Acquired absence of left leg above knee: Secondary | ICD-10-CM | POA: Diagnosis not present

## 2021-07-30 DIAGNOSIS — N1831 Chronic kidney disease, stage 3a: Secondary | ICD-10-CM | POA: Diagnosis not present

## 2021-07-30 DIAGNOSIS — E1122 Type 2 diabetes mellitus with diabetic chronic kidney disease: Secondary | ICD-10-CM | POA: Diagnosis not present

## 2021-07-30 LAB — COMPREHENSIVE METABOLIC PANEL
ALT: 15 U/L (ref 0–44)
AST: 24 U/L (ref 15–41)
Albumin: 3.4 g/dL — ABNORMAL LOW (ref 3.5–5.0)
Alkaline Phosphatase: 99 U/L (ref 38–126)
Anion gap: 7 (ref 5–15)
BUN: 25 mg/dL — ABNORMAL HIGH (ref 8–23)
CO2: 20 mmol/L — ABNORMAL LOW (ref 22–32)
Calcium: 8.5 mg/dL — ABNORMAL LOW (ref 8.9–10.3)
Chloride: 97 mmol/L — ABNORMAL LOW (ref 98–111)
Creatinine, Ser: 1.3 mg/dL — ABNORMAL HIGH (ref 0.44–1.00)
GFR, Estimated: 39 mL/min — ABNORMAL LOW (ref >60)
Glucose, Bld: 154 mg/dL — ABNORMAL HIGH (ref 70–99)
Potassium: 4.5 mmol/L (ref 3.5–5.1)
Sodium: 124 mmol/L — ABNORMAL LOW (ref 135–145)
Total Bilirubin: 0.1 mg/dL — ABNORMAL LOW (ref 0.3–1.2)
Total Protein: 6.4 g/dL — ABNORMAL LOW (ref 6.5–8.1)

## 2021-07-30 LAB — CBC WITH DIFFERENTIAL/PLATELET
Abs Immature Granulocytes: 0.03 10*3/uL (ref 0.00–0.07)
Basophils Absolute: 0 10*3/uL (ref 0.0–0.1)
Basophils Relative: 1 %
Eosinophils Absolute: 0.1 10*3/uL (ref 0.0–0.5)
Eosinophils Relative: 1 %
HCT: 28.7 % — ABNORMAL LOW (ref 36.0–46.0)
Hemoglobin: 9.6 g/dL — ABNORMAL LOW (ref 12.0–15.0)
Immature Granulocytes: 1 %
Lymphocytes Relative: 30 %
Lymphs Abs: 1.1 10*3/uL (ref 0.7–4.0)
MCH: 32.3 pg (ref 26.0–34.0)
MCHC: 33.4 g/dL (ref 30.0–36.0)
MCV: 96.6 fL (ref 80.0–100.0)
Monocytes Absolute: 0.2 10*3/uL (ref 0.1–1.0)
Monocytes Relative: 6 %
Neutro Abs: 2.1 10*3/uL (ref 1.7–7.7)
Neutrophils Relative %: 61 %
Platelets: 163 10*3/uL (ref 150–400)
RBC: 2.97 MIL/uL — ABNORMAL LOW (ref 3.87–5.11)
RDW: 12.7 % (ref 11.5–15.5)
WBC: 3.5 10*3/uL — ABNORMAL LOW (ref 4.0–10.5)
nRBC: 0 % (ref 0.0–0.2)

## 2021-07-30 MED ORDER — MEGESTROL ACETATE 625 MG/5ML PO SUSP: 625.0000 mg | Freq: Every day | ORAL | 0 refills | Status: AC

## 2021-07-30 MED ORDER — SODIUM CHLORIDE 0.9 % IV SOLN
INTRAVENOUS | Status: AC
  Filled 2021-07-30: qty 250

## 2021-07-30 MED ORDER — HYDROCODONE-ACETAMINOPHEN 5-325 MG PO TABS: 1.0000 | ORAL_TABLET | Freq: Three times a day (TID) | ORAL | 0 refills | Status: DC | PRN

## 2021-07-30 NOTE — Progress Notes (Signed)
Symptom Management [place] at [place] Telephone:([phone]) [phone] Fax:([phone]  Patient Care Team: [name], FNP as PCP - General (Family Medicine) Jason Coop, NP as Nurse Practitioner (Hospice and Palliative Medicine) [name], MD as Consulting Physician (Oncology) De Hollingshead, RPH-CPP (Pharmacist) Minna Merritts, MD as Consulting Physician (Cardiology) Lauretta Grill, NP as Nurse Practitioner (Nurse Practitioner) Loomis Anacker, [name], NP as Nurse Practitioner [facility] and Palliative Medicine)   Name of the patient: Tiffany Bailey  [id number]  Dec 05, 1927   Date of visit: 07/30/21  Reason for Consult: [name] is a 88 y.o. female with multiple medical problems including COPD, PVD status post history of left lower extremity amputation, CKD, CAD, sickle cell disease, diabetes, hypertension, and stage IV breast cancer.  CT of the chest, abdomen, and pelvis on 07/06/2021 revealed interval decrease in size of pretracheal and subcarinal lymph nodes consistent with treatment response.  Patient last saw Dr. Tasia Catchings on 07/12/2021 at which time patient had been holding abemaciclib for an additional 2 weeks with plan to restart at a lower daily dose.  Patient called into the clinic on 07/27/2021 with reported diarrhea.  She was advised to discontinue the abemaciclib and [facility] visit was scheduled today for follow-up.  Today, patient reports that diarrhea has resolved.  However, patient endorses poor appetite not eating or drinking much.  She denies nausea or vomiting.  No fever or chills.  She also endorses chronic headaches that have persisted for more than 6 months.  Patient had a CT of the head on 01/07/2021 for same without any acute findings.   Denies any neurologic complaints. Denies recent fevers or illnesses. Denies any easy bleeding or bruising. Reports good appetite and denies weight loss. Denies chest pain. Denies any  nausea, vomiting, constipation, or diarrhea. Denies urinary complaints. Patient offers no further specific complaints today.    PAST MEDICAL HISTORY: Past Medical History:  Diagnosis Date   Anemia    Anginal pain ([place])    Arthritis    Breast cancer ([place]) 07/19/2019   CKD (chronic kidney disease), stage III ([place])    Coronary artery disease    a. 2002 CABG x 3 (LIMA-LAD, SVG-D1, SVG-RCA); b. s/p multiple PCI's.   Diabetes mellitus without complication ([place])    Diastolic dysfunction    a. 12/2017 Echo: EF 55-60%, no rwma, Gr1 DD. Triv AI. Mild-mod MR/TR. PASP 8mHg.   Edema    Failure to thrive in adult    GERD (gastroesophageal reflux disease)    Hb-SS disease without crisis ([place] 10/23/2017   History of breast cancer    HOH (hard of hearing)    Hyperlipidemia    Hypertension    Myocardial infarction ([facility]    PAD (peripheral artery disease) [place]    a. s/p L AKA; b. 12/2018 PTA R peroneal & PTA/Stenting R SFA.   Palpitations     PAST SURGICAL HISTORY:  Past Surgical History:  Procedure Laterality Date   ABDOMINAL HYSTERECTOMY     ABOVE KNEE LEG AMPUTATION Left 2013   ANTERIOR VITRECTOMY Left 11/16/2015   Procedure: ANTERIOR VITRECTOMY;  Surgeon: BEulogio Bear MD;  Location: [facility] ORS;  Service: Ophthalmology;  Laterality: Left;   BLADDER SURGERY     CATARACT EXTRACTION W/PHACO Left 11/16/2015   Procedure: CATARACT EXTRACTION PHACO AND INTRAOCULAR LENS PLACEMENT (IOC);  Surgeon: BEulogio Bear MD;  Location: [facility] ORS;  Service: Ophthalmology;  Laterality: Left;  Lot # [id number] [place] 01:22.8 AP%:12.6 CDE:  10.46   CATARACT EXTRACTION W/PHACO Right 08/22/2016   Procedure: CATARACT EXTRACTION PHACO AND INTRAOCULAR LENS PLACEMENT (IOC);  Surgeon: Eulogio Bear, MD;  Location: [facility] ORS;  Service: Ophthalmology;  Laterality: Right;  Lot # [id number] H [place]: 00:52.1 AP%:8.7 CDE: 4.99    CORONARY ANGIOPLASTY     STENT   CORONARY ARTERY BYPASS GRAFT     LOWER EXTREMITY ANGIOGRAPHY  Right 01/14/2019   Procedure: LOWER EXTREMITY ANGIOGRAPHY;  Surgeon: Algernon Huxley, MD;  Location: [facility];  Service: Cardiovascular;  Laterality: Right;   MASTECTOMY     TUMOR REMOVAL     ABDOMINAL    HEMATOLOGY/ONCOLOGY HISTORY:  Oncology History  Breast cancer ([place])  07/19/2019 Initial Diagnosis   Breast cancer ([place])   07/26/2019 - 07/26/2019 Chemotherapy   The patient had anastrozole (ARIMIDEX) 1 MG tablet, 1 mg, Oral, Daily, 0 of 1 cycle, Start date: --, End date: -- abemaciclib (VERZENIO) 150 MG tablet, 150 mg, Oral, 2 times daily, 0 of 1 cycle, Start date: --, End date: --   for chemotherapy treatment.       ALLERGIES:  is allergic to amoxicillin, gabapentin, mirtazapine, and other.  MEDICATIONS:  Current Outpatient Medications  Medication Sig Dispense Refill   abemaciclib (VERZENIO) 50 MG tablet Take 1 tablet (50 mg total) by mouth 2 (two) times daily. (Patient not taking: Reported on 07/12/2021) 56 tablet 0   amLODipine (NORVASC) 10 MG tablet Take 1 tablet (10 mg total) by mouth daily. 90 tablet 1   aspirin EC 81 MG tablet Take 1 tablet (81 mg total) by mouth daily. 30 tablet 5   atorvastatin (LIPITOR) 40 MG tablet TAKE 1 TABLET BY MOUTH  DAILY 90 tablet 3   blood glucose meter kit and supplies KIT Dispense based on patient and insurance preference. Use up to four times daily as directed. (FOR ICD-9 250.00, 250.01). 1 each 0   Blood Glucose Monitoring Suppl (ONETOUCH VERIO REFLECT) w/Device KIT 1 Device by Does not apply route daily. 1 kit 0   cloNIDine (CATAPRES) 0.1 MG tablet Take 1 tablet (0.1 mg total) by mouth 2 (two) times daily. 60 tablet 2   clopidogrel (PLAVIX) 75 MG tablet TAKE 1 TABLET BY MOUTH  DAILY 30 tablet 0   Continuous Blood Gluc Receiver (FREESTYLE LIBRE 2 READER) DEVI Use to check blood sugar continuously 1 each 1   Continuous Blood Gluc Sensor (FREESTYLE LIBRE 2 SENSOR) MISC Apply 1 each topically every 14 (fourteen) days. 2 each 11   feeding  supplement (ENSURE ENLIVE / ENSURE PLUS) LIQD Take 237 mLs by mouth 2 (two) times daily between meals.     glucose blood (ONE TOUCH TEST STRIPS) test strip Use to test blood sugar twice daily 200 each 3   hydrALAZINE (APRESOLINE) 10 MG tablet Take 1 tablet (10 mg total) by mouth in the morning and at bedtime. 180 tablet 3   insulin degludec (TRESIBA FLEXTOUCH) 100 UNIT/ML FlexTouch Pen Inject 6 Units into the skin at bedtime.     Insulin Pen Needle (BD PEN NEEDLE NANO U/F) 32G X 4 MM MISC USE 1 DAILY 100 each 12   Lancets (ONETOUCH ULTRASOFT) lancets 1 each by Other route 2 (two) times daily. 90 day supply for [employer].  Dx E11.9 300 each 12   loperamide (IMODIUM) 2 MG capsule Take 1 capsule (2 mg total) by mouth See admin instructions. Initial: 4 mg, followed by 2 mg after each loose stool; maximum: 16 mg/day 60 capsule  1   Metoprolol Tartrate 37.5 MG TABS Take 1 tablet by mouth 2 (two) times daily.     nitroGLYCERIN (NITROSTAT) 0.4 MG SL tablet Place 1 tablet (0.4 mg total) under the tongue every 5 (five) minutes as needed for chest pain. 25 tablet 1   ondansetron (ZOFRAN) 4 MG tablet Take 1 tablet (4 mg total) by mouth every 6 (six) hours as needed for nausea or vomiting. 60 tablet 1   valsartan (DIOVAN) 160 MG tablet Take 1 tablet (160 mg total) by mouth daily. 90 tablet 3   vitamin C (ASCORBIC ACID) 250 MG tablet Take 250 mg by mouth daily.     No current facility-administered medications for this visit.    VITAL SIGNS: BP (!) 146/45    Pulse (!) 48    Temp (!) 96.2 F (35.7 C) (Tympanic)    Resp (!) 49    SpO2 100%  There were no vitals filed for this visit.  Estimated body mass index is 17.13 kg/m as calculated from the following:   Height as of 07/11/21: 5' (1.524 m).   Weight as of 07/12/21: 87 lb 11.2 oz (39.8 kg).  LABS: CBC:    Component Value Date/Time   WBC 3.5 (L) 07/30/2021 1053   HGB 9.6 (L) 07/30/2021 1053   HCT 28.7 (L) 07/30/2021 1053   PLT 163  07/30/2021 1053   MCV 96.6 07/30/2021 1053   NEUTROABS 2.1 07/30/2021 1053   LYMPHSABS 1.1 07/30/2021 1053   MONOABS 0.2 07/30/2021 1053   EOSABS 0.1 07/30/2021 1053   BASOSABS 0.0 07/30/2021 1053   Comprehensive Metabolic Panel:    Component Value Date/Time   NA 124 (L) 07/30/2021 1053   K 4.5 07/30/2021 1053   CL 97 (L) 07/30/2021 1053   CO2 20 (L) 07/30/2021 1053   BUN 25 (H) 07/30/2021 1053   CREATININE 1.30 (H) 07/30/2021 1053   CREATININE 1.18 (H) 06/19/2018 1006   GLUCOSE 154 (H) 07/30/2021 1053   CALCIUM 8.5 (L) 07/30/2021 1053   AST 24 07/30/2021 1053   ALT 15 07/30/2021 1053   ALKPHOS 99 07/30/2021 1053   BILITOT 0.1 (L) 07/30/2021 1053   PROT 6.4 (L) 07/30/2021 1053   ALBUMIN 3.4 (L) 07/30/2021 1053    RADIOGRAPHIC STUDIES: CT CHEST ABDOMEN PELVIS WO CONTRAST  Result Date: 07/06/2021 CLINICAL DATA:  Metastatic breast cancer EXAM: CT CHEST, ABDOMEN AND PELVIS WITHOUT CONTRAST TECHNIQUE: Multidetector CT imaging of the chest, abdomen and pelvis was performed following the standard protocol without IV contrast. RADIATION DOSE REDUCTION: This exam was performed according to the departmental dose-optimization program which includes automated exposure control, adjustment of the mA and/or kV according to patient size and/or use of iterative reconstruction technique. COMPARISON:  03/01/2021 FINDINGS: CT CHEST FINDINGS Cardiovascular: Aortic atherosclerosis. Normal heart size. Extensive 3 vessel coronary artery calcifications and stents. No pericardial effusion. Mediastinum/Nodes: Interval decrease in size of a pretracheal lymph node, measuring 3.0 x 2.4 cm, previously 3.5 x 2.8 cm (series 2, image 26). Interval decrease in size of a subcarinal lymph node measuring 2.5 x 1.8 cm, previously 2.7 x 2.4 cm (series 2, image 30). Thyroid gland, trachea, and esophagus demonstrate no significant findings. Lungs/Pleura: Unchanged small right pleural effusion. Diffuse bilateral bronchial wall  thickening. Multiple bilateral ground-glass and subsolid pulmonary nodules are unchanged, largest subsolid nodule of the right upper lobe measuring 1.7 x 1.1 cm (series 3, image 69). Additional ground-glass opacity of the lingula measures 1.4 x 0.9 cm, unchanged (series 3, image 75).  Multiple solid pulmonary nodules of the right lung are unchanged, index nodule of the medial segment right middle lobe measuring 0.7 x 0.5 cm (series 3, image 103), fissural nodule of the posterior right upper lobe measuring 0.7 x 0.5 cm (series 3, image 54). Musculoskeletal: No chest wall mass or suspicious osseous lesions identified. CT ABDOMEN PELVIS FINDINGS Hepatobiliary: No solid liver abnormality is seen. Status post cholecystectomy. No gallbladder wall thickening, or biliary dilatation. Pancreas: Unremarkable. No pancreatic ductal dilatation or surrounding inflammatory changes. Spleen: Normal in size without significant abnormality. Adrenals/Urinary Tract: Adrenal glands are unremarkable. Numerous bilateral renal calculi and or renal vascular calcifications. No hydronephrosis. Bladder is unremarkable. Stomach/Bowel: Stomach is within normal limits. Appendix appears normal. No evidence of bowel wall thickening, distention, or inflammatory changes. Vascular/Lymphatic: No significant vascular findings are present. No enlarged abdominal or pelvic lymph nodes. Reproductive: Status post hysterectomy. Other: No abdominal wall hernia or abnormality. No ascites. Musculoskeletal: No acute osseous findings. Unchanged superior endplate deformity of L5 (series 6, image 83). IMPRESSION: 1. Interval decrease in size of pretracheal and subcarinal lymph nodes, consistent with treatment response of nodal metastatic disease. 2. Multiple multiple solid nodules of the right lung are unchanged. 3. Additional bilateral ground-glass and subsolid pulmonary nodules are unchanged. 4. Unchanged small right pleural effusion. 5. No noncontrast CT evidence of  new metastatic disease. 6. Numerous bilateral renal calculi and or renal vascular calcifications. No hydronephrosis. Aortic Atherosclerosis (ICD10-I70.0). Electronically Signed   By: Delanna Ahmadi M.D.   On: 07/06/2021 17:22    PERFORMANCE STATUS (ECOG) : 2 - Symptomatic, <50% confined to bed  Review of Systems Unless otherwise noted, a complete review of systems is negative.  Physical Exam General: NAD Cardiovascular: regular rate and rhythm Pulmonary: clear ant fields Abdomen: soft, nontender, + bowel sounds GU: no suprapubic tenderness Extremities: no edema, no joint deformities, left AKA Skin: no rashes Neurological: Weakness but otherwise nonfocal  Assessment and Plan- Patient is a 88 y.o. female with multiple medical problems including COPD, PVD status post history of left lower extremity amputation, CKD, CAD, sickle cell disease, diabetes, hypertension, and stage IV breast cancer.   Poor appetite  -patient appears mildly dehydrated on labs.  We will give gentle IV fluids today.  Patient says that she has responded well in the past to Megace elixir.  Discussed with Dr. Tasia Catchings and will restart.  Patient/family verbalized understanding the risks including clotting.  We will plan for 2-week trial as poor appetite could be attributed to abemaciclib and may improve after discontinuation.  Patient is followed by nutrition.  Headaches -unclear etiology.  This has been evaluated with imaging of the brain in the past without acute findings.  Discussed with Dr. Tasia Catchings and will hold on further imaging now given the unchanged nature of headaches without any other presenting neurological findings.  We will start patient on low-dose Norco.   Patient expressed understanding and was in agreement with this plan. She also understands that She can call clinic at any time with any questions, concerns, or complaints.   Thank you for allowing me to participate in the care of this very pleasant patient.   Time  Total: 15 minutes  Visit consisted of counseling and education dealing with the complex and emotionally intense issues of symptom management in the setting of serious illness.Greater than 50%  of this time was spent counseling and coordinating care related to the above assessment and plan.  Signed by: Altha Harm, PhD, NP-C

## 2021-07-30 NOTE — Progress Notes (Signed)
Pt in [facility] today for poor appetite. States that she had an episode of diarrhea on Friday. She was told to stop verzenio. Pt reports that she has not had any more diarrhea since the one episode Friday, and that her main concern is lack of appetite.

## 2021-07-31 ENCOUNTER — Other Ambulatory Visit: Payer: Medicare Other

## 2021-08-01 DIAGNOSIS — E119 Type 2 diabetes mellitus without complications: Secondary | ICD-10-CM | POA: Diagnosis not present

## 2021-08-01 DIAGNOSIS — R636 Underweight: Secondary | ICD-10-CM | POA: Diagnosis not present

## 2021-08-03 ENCOUNTER — Other Ambulatory Visit: Payer: Medicare Other | Admitting: Primary Care

## 2021-08-03 ENCOUNTER — Other Ambulatory Visit: Payer: Self-pay

## 2021-08-06 ENCOUNTER — Ambulatory Visit (INDEPENDENT_AMBULATORY_CARE_PROVIDER_SITE_OTHER): Payer: Medicare Other | Admitting: Pharmacist

## 2021-08-06 ENCOUNTER — Other Ambulatory Visit (HOSPITAL_COMMUNITY): Payer: Self-pay

## 2021-08-06 ENCOUNTER — Ambulatory Visit: Payer: Medicare Other

## 2021-08-06 DIAGNOSIS — E1159 Type 2 diabetes mellitus with other circulatory complications: Secondary | ICD-10-CM

## 2021-08-06 DIAGNOSIS — Z794 Long term (current) use of insulin: Secondary | ICD-10-CM

## 2021-08-06 DIAGNOSIS — E1122 Type 2 diabetes mellitus with diabetic chronic kidney disease: Secondary | ICD-10-CM

## 2021-08-06 MED ORDER — TRESIBA FLEXTOUCH 100 UNIT/ML ~~LOC~~ SOPN: 4.0000 [IU] | PEN_INJECTOR | Freq: Every day | SUBCUTANEOUS | 0 refills | Status: DC

## 2021-08-06 NOTE — Chronic Care Management (AMB) (Signed)
Chronic Care Management CCM Pharmacy Note  08/06/2021 Name:  Tiffany Bailey MRN:  [id number] DOB:  1928-03-22  Summary: - Using Libre CGM. Reports some fasting low readings <70  Recommendations/Changes made from today's visit: - Reduce Tresiba to 4 units. Follow up next week  Subjective: Tiffany Bailey is an 88 y.o. year old female who is a primary patient of [name], FNP.  The CCM team was consulted for assistance with disease management and care coordination needs.    Engaged with patient by telephone for follow up visit for pharmacy case management and/or care coordination services.   Objective:  Medications Reviewed Today     Reviewed by Lillia Corporal, RN (Registered Nurse) on 07/30/21 at 66  Med List Status: <None>   Medication Order Taking? Sig Documenting Provider Last Dose Status Informant  abemaciclib (VERZENIO) 50 MG tablet [id number] No Take 1 tablet (50 mg total) by mouth 2 (two) times daily.  Patient not taking: Reported on 07/12/2021   [name], MD Not Taking Active   amLODipine (NORVASC) 10 MG tablet [id number] No Take 1 tablet (10 mg total) by mouth daily. [name], FNP Unknown Active   aspirin EC 81 MG tablet [id number] No Take 1 tablet (81 mg total) by mouth daily. [name], DO Unknown Active Self  atorvastatin (LIPITOR) 40 MG tablet [id number] No TAKE 1 TABLET BY MOUTH  DAILY [name], FNP Unknown Active   blood glucose meter kit and supplies KIT [id number] No Dispense based on patient and insurance preference. Use up to four times daily as directed. (FOR ICD-9 250.00, 250.01). [name], DO Unknown Active Self  Blood Glucose Monitoring Suppl (ONETOUCH VERIO REFLECT) w/Device KIT [id number] No 1 Device by Does not apply route daily. [name], FNP Unknown Active   cloNIDine (CATAPRES) 0.1 MG tablet [id number] No Take 1 tablet (0.1 mg total) by mouth 2 (two) times daily. [name], FNP Unknown Active    clopidogrel (PLAVIX) 75 MG tablet [id number] No TAKE 1 TABLET BY MOUTH  DAILY [name], [name], MD Unknown Active Self  Continuous Blood Gluc Receiver (FREESTYLE LIBRE 2 READER) DEVI [id number] No Use to check blood sugar continuously [name], FNP Unknown Active   Continuous Blood Gluc Sensor (FREESTYLE LIBRE 2 SENSOR) MISC [id number] No Apply 1 each topically every 14 (fourteen) days. [name], FNP Unknown Active   feeding supplement (ENSURE ENLIVE / ENSURE PLUS) LIQD [id number] No Take 237 mLs by mouth 2 (two) times daily between meals. [name], MD Unknown Active   glucose blood (ONE TOUCH TEST STRIPS) test strip [id number] No Use to test blood sugar twice daily [name], FNP Unknown Active   hydrALAZINE (APRESOLINE) 10 MG tablet [id number] No Take 1 tablet (10 mg total) by mouth in the morning and at bedtime. [name], [name], PA-C Unknown Active Self  insulin degludec (TRESIBA FLEXTOUCH) 100 UNIT/ML FlexTouch Pen [id number] No Inject 6 Units into the skin at bedtime. [provider] Unknown Active   Insulin Pen Needle (BD PEN NEEDLE NANO U/F) 32G X 4 MM MISC [id number] No USE 1 DAILY [name], [name], FNP Unknown Active   Lancets [facility] ULTRASOFT) lancets [id number] No 1 each by Other route 2 (two) times daily. 90 day supply for [employer].  Dx E11.9 [name], FNP Unknown Active   loperamide (IMODIUM) 2 MG capsule [id number] No Take 1 capsule (2 mg total) by mouth See  admin instructions. Initial: 4 mg, followed by 2 mg after each loose stool; maximum: 16 mg/day [name], MD Unknown Active   Metoprolol Tartrate 37.5 MG TABS [id number] No Take 1 tablet by mouth 2 (two) times daily. [provider] Unknown Active Self  nitroGLYCERIN (NITROSTAT) 0.4 MG SL tablet [id number] No Place 1 tablet (0.4 mg total) under the tongue every 5 (five) minutes as needed for chest pain. [name], [name], PA-C Unknown Active   ondansetron (ZOFRAN) 4 MG  tablet [id number] No Take 1 tablet (4 mg total) by mouth every 6 (six) hours as needed for nausea or vomiting. [name], MD Unknown Active   valsartan (DIOVAN) 160 MG tablet [id number] No Take 1 tablet (160 mg total) by mouth daily. [name], [name], PA-C Unknown Active   vitamin C (ASCORBIC ACID) 250 MG tablet [id number] No Take 250 mg by mouth daily. [provider] Unknown Active Self  Med List Note Darl Pikes, RPH-CPP 03/19/21 1411): Verzenio filled at [place]            Pertinent Labs:   Lab Results  Component Value Date   HGBA1C 8.6 (A) 02/13/2021   Lab Results  Component Value Date   CHOL 136 02/26/2019   HDL 46.50 02/26/2019   LDLCALC 76 02/26/2019   TRIG 63.0 02/26/2019   CHOLHDL 3 02/26/2019   Lab Results  Component Value Date   CREATININE 1.30 (H) 07/30/2021   BUN 25 (H) 07/30/2021   NA 124 (L) 07/30/2021   K 4.5 07/30/2021   CL 97 (L) 07/30/2021   CO2 20 (L) 07/30/2021    SDOH:  (Social Determinants of Health) assessments and interventions performed:  SDOH Interventions    Flowsheet Row Most Recent Value  SDOH Interventions   Financial Strain Interventions Other (Comment), Intervention Not Indicated       CCM Care Plan  Review of patient past medical history, allergies, medications, health status, including review of consultants reports, laboratory and other test data, was performed as part of comprehensive evaluation and provision of chronic care management services.   Care Plan : Medication Management  Updates made by De Hollingshead, RPH-CPP since 08/06/2021 12:00 AM     Problem: Diabetes, HTN, CAD      Long-Range Goal: Disease Progression Prevention   Start Date: 02/22/2021  Recent Progress: On track  Priority: High  Note:   Current Barriers:  Unable to achieve control of diabetes   Pharmacist Clinical Goal(s):  Over the next 90 days, patient will achieve control of diabetes as evidenced by A1c  through  collaboration with PharmD and provider.   Interventions: 1:1 collaboration with [name], FNP regarding development and update of comprehensive plan of care as evidenced by provider attestation and co-signature Inter-disciplinary care team collaboration (see longitudinal plan of care) Comprehensive medication review performed; medication list updated in electronic medical record  Diabetes: Uncontrolled; current treatment: Tresiba 6 units QPM Hx Jardiance - off due to dehydration previously  Would avoid metformin due to fluctuating renal dysfunction, avoid GLP1 given concurrent GI upset Current glucose readings: using Libre 2 CGM; reports a midday reading of 220 yesterday, but also reports concerns with fasting hypoglycemia <70. Reports frequent "beeping", unclear if these are high glucose alarms or low glucose alarms. Reports reading of 77 while on the phone today; unable to read off reports of averages to me. Reports scanning 0-2 times daily.  Educated [name] and [place] regarding scanning more frequently - at least every  8 hours. Encouraged to consume Ensure shake right now to improve sugar readings. Discussed with PCP. Reduce Tresiba to 4 units daily. Collaborated w/ Palliative NP. She will call me tomorrow and we can review meter data.  Moving forward, may need to hold insulin therapy while appetite is poor. Consider change to Glucerna instead of Ensure.   Hypertension, CAD in the setting of CKD: Uncontrolled; current treatment: amlodipine 10 mg dailyhydralazine 10 mg BID, clonidine 0.1 mg BID, metoprolol tartrate 37.5 mg BID, valsartan 160 mg  Hx lisinopril, was previously stopped due to renal function Previously recommended to continue current regimen at this time  Hyperlipidemia and CAD: Appropriately managed; current treatment: atorvastatin 40 mg daily Antiplatelet regimen: aspirin 81 mg daily, clopidogrel 75 mg daily Previously recommended to continue current regimen at this  time along with collaboration with cardiology  Metastatic Breast Cancer: Managed by hem/onc; current regimen: Verzenio on hold due to GI upset, nausea. Iron deficiency anemia: ferrous gluconate 324 mg BID - though patient taking daily  Patient Goals/Self-Care Activities Over the next 90 days, patient will:  - take medications as prescribed focus on medication adherence by using pill box check glucose daily to twice daily, document, and provide at future appointments check blood pressure daily, document, and provide at future appointments      Plan: Telephone follow up appointment with care management team member scheduled for:  1 week  [name], PharmD, [place], CPP Clinical Pharmacist [employer] at [employer] [phone]

## 2021-08-06 NOTE — Patient Instructions (Signed)
Tiffany and Tiffany Bailey,   Reduce Tiffany Bailey to 4 units daily. Please scan your sugar AT LEAST every 8 hours - try to remember to scan when you wake up, a few times during the day, and before you go to bed. This is how we see all of your sugar throughout the day.   Take care,   [name], PharmD  Visit Information  Following are the goals we discussed today:  Patient Goals/Self-Care Activities Over the next 90 days, patient will:  - take medications as prescribed focus on medication adherence by using pill box check glucose daily to twice daily, document, and provide at future appointments check blood pressure daily, document, and provide at future appointments        Plan: Telephone follow up appointment with care management team member scheduled for:  1 week   [name], PharmD, [place], CPP Clinical Pharmacist [place] at [facility] [phone]   Please call the care guide team at [phone] if you need to cancel or reschedule your appointment.   Patient verbalizes understanding of instructions and care plan provided today and agrees to view in [place]. Active MyChart status confirmed with patient.

## 2021-08-07 ENCOUNTER — Other Ambulatory Visit: Payer: Medicare Other | Admitting: Primary Care

## 2021-08-07 ENCOUNTER — Other Ambulatory Visit: Payer: Self-pay

## 2021-08-07 ENCOUNTER — Ambulatory Visit: Payer: Medicare Other | Admitting: Pharmacist

## 2021-08-07 DIAGNOSIS — Z515 Encounter for palliative care: Secondary | ICD-10-CM | POA: Diagnosis not present

## 2021-08-07 DIAGNOSIS — C50919 Malignant neoplasm of unspecified site of unspecified female breast: Secondary | ICD-10-CM

## 2021-08-07 DIAGNOSIS — E1140 Type 2 diabetes mellitus with diabetic neuropathy, unspecified: Secondary | ICD-10-CM | POA: Diagnosis not present

## 2021-08-07 DIAGNOSIS — Z89612 Acquired absence of left leg above knee: Secondary | ICD-10-CM | POA: Diagnosis not present

## 2021-08-07 DIAGNOSIS — Z170 Estrogen receptor positive status [ER+]: Secondary | ICD-10-CM | POA: Diagnosis not present

## 2021-08-07 DIAGNOSIS — E1159 Type 2 diabetes mellitus with other circulatory complications: Secondary | ICD-10-CM

## 2021-08-07 DIAGNOSIS — E1122 Type 2 diabetes mellitus with diabetic chronic kidney disease: Secondary | ICD-10-CM

## 2021-08-07 NOTE — Patient Instructions (Signed)
Visit Information  Following are the goals we discussed today:  Over the next 90 days, patient will:  - take medications as prescribed focus on medication adherence by using pill box check glucose daily to twice daily, document, and provide at future appointments check blood pressure daily, document, and provide at future appointments        Plan: Telephone follow up appointment with care management team member scheduled for:  1 week   [name], PharmD, [place], CPP Clinical Pharmacist [place] at [facility] [phone]             Please call the care guide team at [phone] if you need to cancel or reschedule your appointment.   The patient verbalized understanding of instructions, educational materials, and care plan provided today and declined offer to receive copy of patient instructions, educational materials, and care plan.

## 2021-08-07 NOTE — Chronic Care Management (AMB) (Signed)
Chronic Care Management CCM Pharmacy Note  08/07/2021 Name:  Tiffany Bailey MRN:  [id number] DOB:  04-Feb-1928  Summary: - Collaborated with Ralene Bathe, NP. Patient has been taking Toujeo 4 units daily instead of [place], chart updated. Reviewed CGM data. Reviewed diet  Recommendations/Changes made from today's visit: - Continue Toujeo 4 units daily for now. Reduce regular soda intake. Add protein shake before bed to help maintain overnight sugar control. Evaluate glycemic control without regular sodas to see if insulin needed  Subjective: Tiffany Bailey is an 88 y.o. year old female who is a primary patient of [name], FNP.  The CCM team was consulted for assistance with disease management and care coordination needs.    Engaged with patient by telephone for follow up visit for pharmacy case management and/or care coordination services via phone call with Ralene Bathe, NP  Objective:  Medications Reviewed Today     Reviewed by Jason Coop, NP (Nurse Practitioner) on 08/07/21 at 1103  Med List Status: <None>   Medication Order Taking? Sig Documenting Provider Last Dose Status Informant  abemaciclib (VERZENIO) 50 MG tablet [id number] No Take 1 tablet (50 mg total) by mouth 2 (two) times daily.  Patient not taking: Reported on 07/12/2021   [name], MD Not Taking Active   amLODipine (NORVASC) 10 MG tablet [id number] No Take 1 tablet (10 mg total) by mouth daily. [name], FNP Unknown Active   aspirin EC 81 MG tablet [id number] No Take 1 tablet (81 mg total) by mouth daily. [name], DO Unknown Active Self  atorvastatin (LIPITOR) 40 MG tablet [id number] No TAKE 1 TABLET BY MOUTH  DAILY [name], FNP Unknown Active   blood glucose meter kit and supplies KIT [id number] No Dispense based on patient and insurance preference. Use up to four times daily as directed. (FOR ICD-9 250.00, 250.01). [name], DO Unknown Active Self  Blood Glucose  Monitoring Suppl (ONETOUCH VERIO REFLECT) w/Device KIT [id number] No 1 Device by Does not apply route daily. [name], FNP Unknown Active   cloNIDine (CATAPRES) 0.1 MG tablet [id number] No Take 1 tablet (0.1 mg total) by mouth 2 (two) times daily. [name], FNP Unknown Active   clopidogrel (PLAVIX) 75 MG tablet [id number] No TAKE 1 TABLET BY MOUTH  DAILY [name], [name], MD Unknown Active Self  Continuous Blood Gluc Receiver (FREESTYLE LIBRE 2 READER) DEVI [id number] No Use to check blood sugar continuously [name], FNP Unknown Active   Continuous Blood Gluc Sensor (FREESTYLE LIBRE 2 SENSOR) MISC [id number] No Apply 1 each topically every 14 (fourteen) days. [name], FNP Unknown Active   feeding supplement (ENSURE ENLIVE / ENSURE PLUS) LIQD [id number] No Take 237 mLs by mouth 2 (two) times daily between meals. [name], MD Unknown Active   glucose blood (ONE TOUCH TEST STRIPS) test strip [id number] No Use to test blood sugar twice daily [name], FNP Unknown Active   hydrALAZINE (APRESOLINE) 10 MG tablet [id number] No Take 1 tablet (10 mg total) by mouth in the morning and at bedtime. [name], [name], PA-C Unknown Active Self  HYDROcodone-acetaminophen (NORCO) 5-325 MG tablet [id number]  Take 1 tablet by mouth every 8 (eight) hours as needed for moderate pain. Borders, [name], NP  Active   Insulin Glargine (TOUJEO SOLOSTAR [place]) [id number] No Inject 4 Units into the skin daily. [provider] Taking Active   Insulin Pen Needle (BD PEN NEEDLE NANO  U/F) 32G X 4 MM MISC [id number] No USE 1 DAILY [name], [name], FNP Unknown Active   Lancets [facility] ULTRASOFT) lancets [id number] No 1 each by Other route 2 (two) times daily. 90 day supply for [employer].  Dx E11.9 [name], FNP Unknown Active   loperamide (IMODIUM) 2 MG capsule [id number] No Take 1 capsule (2 mg total) by mouth See admin instructions. Initial: 4 mg, followed by 2  mg after each loose stool; maximum: 16 mg/day [name], MD Unknown Active   megestrol (MEGACE ES) 625 MG/5ML suspension [id number]  Take 5 mLs (625 mg total) by mouth daily. Borders, [name], NP  Active   Metoprolol Tartrate 37.5 MG TABS [id number] No Take 1 tablet by mouth 2 (two) times daily. [provider] Unknown Active Self  nitroGLYCERIN (NITROSTAT) 0.4 MG SL tablet [id number] No Place 1 tablet (0.4 mg total) under the tongue every 5 (five) minutes as needed for chest pain. [name], [name], PA-C Unknown Active   ondansetron (ZOFRAN) 4 MG tablet [id number] No Take 1 tablet (4 mg total) by mouth every 6 (six) hours as needed for nausea or vomiting. [name], MD Unknown Active   valsartan (DIOVAN) 160 MG tablet [id number] No Take 1 tablet (160 mg total) by mouth daily. [name], [name], PA-C Unknown Active   vitamin C (ASCORBIC ACID) 250 MG tablet [id number] No Take 250 mg by mouth daily. [provider] Unknown Active Self  Med List Note Darl Pikes, RPH-CPP 03/19/21 1411): Verzenio filled at [place]            Pertinent Labs:   Lab Results  Component Value Date   HGBA1C 8.6 (A) 02/13/2021   Lab Results  Component Value Date   CHOL 136 02/26/2019   HDL 46.50 02/26/2019   LDLCALC 76 02/26/2019   TRIG 63.0 02/26/2019   CHOLHDL 3 02/26/2019   Lab Results  Component Value Date   CREATININE 1.30 (H) 07/30/2021   BUN 25 (H) 07/30/2021   NA 124 (L) 07/30/2021   K 4.5 07/30/2021   CL 97 (L) 07/30/2021   CO2 20 (L) 07/30/2021    SDOH:  (Social Determinants of Health) assessments and interventions performed:  SDOH Interventions    Flowsheet Row Most Recent Value  SDOH Interventions   Financial Strain Interventions Intervention Not Indicated       CCM Care Plan  Review of patient past medical history, allergies, medications, health status, including review of consultants reports, laboratory and other test data, was performed as part of  comprehensive evaluation and provision of chronic care management services.   Care Plan : Medication Management  Updates made by De Hollingshead, RPH-CPP since 08/07/2021 12:00 AM     Problem: Diabetes, HTN, CAD      Long-Range Goal: Disease Progression Prevention   Start Date: 02/22/2021  Recent Progress: On track  Priority: High  Note:   Current Barriers:  Unable to achieve control of diabetes   Pharmacist Clinical Goal(s):  Over the next 90 days, patient will achieve control of diabetes as evidenced by A1c  through collaboration with PharmD and provider.   Interventions: 1:1 collaboration with [name], FNP regarding development and update of comprehensive plan of care as evidenced by provider attestation and co-signature Inter-disciplinary care team collaboration (see longitudinal plan of care) Comprehensive medication review performed; medication list updated in electronic medical record  Diabetes: Uncontrolled; current treatment: Tresiba 4 units QPM - NP realized through  medication review that patient is continuing to take [place], but is appropriately clicking up to 4 units daily. Has an expired Lantus pen at home, [place].  Hx Jardiance - off due to dehydration previously  Would avoid metformin due to fluctuating renal dysfunction, avoid GLP1 given concurrent GI upset Meal patterns - reports regular soda in the afternoons. Has Glucerna, Ensure  Current glucose readings: using Libre 2 CGM Average Glucose: 152 mg/dL - midnight - 6 am: 126 - 6 am- noon: 140  - noon - 6 pm: 187 - 6 pm - midnight: 191 Time in Goal:  - Time below range: 5% Observed patterns: reports elevations ~ 6 pm each day, sometimes hypoglycemia ~ 2 am Collaborated with NP. Recommend to continue Toujeo 4 units at this time, as that is what patient has been taking. Advised to throw away expired Lantus, keep Tresiba in fridge to switch to after completion of Toujeo supply.  Discussed  avoiding regular sodas. Discussed consuming a low carbohydrate protein shake before bed to help maintain glucose readings overnight. Reiterated by NP today.   Hypertension, CAD in the setting of CKD: Uncontrolled; current treatment: amlodipine 10 mg dailyhydralazine 10 mg BID, clonidine 0.1 mg BID, metoprolol tartrate 37.5 mg BID, valsartan 160 mg  Hx lisinopril, was previously stopped due to renal function Previously recommended to continue current regimen at this time  Hyperlipidemia and CAD: Appropriately managed; current treatment: atorvastatin 40 mg daily Antiplatelet regimen: aspirin 81 mg daily, clopidogrel 75 mg daily Previously recommended to continue current regimen at this time along with collaboration with cardiology  Metastatic Breast Cancer: Managed by hem/onc; current regimen: Verzenio on hold due to GI upset, nausea. Iron deficiency anemia: ferrous gluconate 324 mg BID - though patient taking daily  Patient Goals/Self-Care Activities Over the next 90 days, patient will:  - take medications as prescribed focus on medication adherence by using pill box check glucose daily to twice daily, document, and provide at future appointments check blood pressure daily, document, and provide at future appointments      Plan: Telephone follow up appointment with care management team member scheduled for:  1 week  [name], PharmD, [place], CPP Clinical Pharmacist [employer] at [employer] [phone]

## 2021-08-07 NOTE — Progress Notes (Signed)
[facility] Palliative Care Consult Note Telephone: [phone]  Fax: [phone]    Date of encounter: 08/07/21 10:34 AM PATIENT NAME: Tiffany Bailey [place] [address]   [phone] (home)  DOB: 08-Jun-1928 MRN: [id number] PRIMARY CARE PROVIDER:    [name], FNP,  [address] [place] [address] [phone]  REFERRING PROVIDER:   [name], FNP [address] [place],  [place] [address] [phone]  RESPONSIBLE PARTY:    Contact Information     Name Relation Home Work Mobile   Ratliff,[name] F Daughter [phone]   shykeria, sakamoto   [phone]   Dixon Boos Daughter   [phone]   Annamary Carolin Daughter   [phone]        I met face to face with patient  in  home. Palliative Care was asked to follow this patient by consultation request of  [name], [name], FNP to address advance care planning and complex medical decision making. This is a follow up visit.                                   ASSESSMENT AND PLAN / RECOMMENDATIONS:   Advance Care Planning/Goals of Care: Goals include to maximize quality of life and symptom management. Patient/health care surrogate gave his/her permission to discuss.Our advance care planning conversation included a discussion about:    The value and importance of advance care planning  Exploration of personal, cultural or spiritual beliefs that might influence medical decisions  Exploration of goals of care in the event of a sudden injury or illness  Identification of a healthcare agent -[name] and Derenda Mis an  advance directive document . No changes Spoke with [name] on the phone RE her upcoming needs for bowel prep and diet control. T/c to [name] to giver report of my assessment and ask her to pursue bowel program. CODE STATUS: FULL CODE  Symptom Management/Plan:   Constipation: Cannot remember last bm.  Does not have bowel program on med list. Needs senna for daily use and MOM for bm now. I will call daughter to ask her to bring medications, senna 1-2 tabs for daily use and MOM for bm today. Abdomen distended with decreased bowel sounds.   Glucose: Discussed with pharmacist Terie Purser. RE insulins. Has Toujeo, Tresiba and Lantus in home. Verified she's taking U-300 4 units at hs. Sugars run  130's from 12 mn to 6 am then in 200's in afternoon and evening. States she drinks sodas with sugar. Reviewed higher protein drinks.  Pain: Has pain in legs. Her medications will contribute to the constipation.  Intake: Endorses drinking sugar drinks, discussed with pharmacist RE glucose control. Also has glucerna. Albumin 3.4. Last weight 87 lbs.  Follow up Palliative Care Visit: Palliative care will continue to follow for complex medical decision making, advance care planning, and clarification of goals. Return 4 weeks or prn.  This visit was coded based on medical decision making (MDM).  PPS: 40%  HOSPICE ELIGIBILITY/DIAGNOSIS: TBD  Chief Complaint: constipation  HISTORY OF PRESENT ILLNESS:  CLARETTA Bailey is a 88 y.o. year old female  with h/o metastatic breast cancer, DM with insulin dependence, L AKA, constipation, chronic pain. Presents at [facility] today with bloat and no recent bm. Discussed laxatives and getting bm today with laxative. Also reviewed bg with pharmacist. .   History obtained from  review of EMR, discussion with primary team, and interview with family, facility staff/caregiver and/or Ms. Depascale.  I reviewed available labs, medications, imaging, studies and related documents from the EMR.  Records reviewed and summarized above.   ROS   General: NAD ENMT: denies dysphagia Cardiovascular: denies chest pain, denies DOE Pulmonary: denies cough, denies increased SOB Abdomen: endorses fair appetite, endorses GERD, endorses constipation, endorses continence of bowel, endorses gas in  abdomen GU: denies dysuria, endorses continence of urine MSK:  endorses  increased weakness,  no falls reported,  Skin: denies rashes or wounds Neurological: endorses phantom L and LE R  pain, denies insomnia Psych: Endorses positive mood Heme/lymph/immuno: denies bruises, abnormal bleeding  Physical Exam: Current and past weights: 87 lbs 1/23. Constitutional: 144/65 HR 55 RR 18  General: frail appearing, thin EYES: anicteric sclera, lids intact, no discharge  ENMT: intact hearing, oral mucous membranes moist, dentition intact CV: S1S2, RRR, no LE edema Pulmonary: LCTA, no increased work of breathing, no cough, room air Abdomen: intake 50%, hypo active BS + 4 quadrants, hard  and non tender, + ascites or bloat GU: deferred MSK: + sarcopenia, moves all extremities, non  ambulatory Skin: warm and dry, no rashes or wounds on visible skin Neuro:  + generalized weakness,  moderate  cognitive impairment Psych: non-anxious affect, A and O x 2-3 Hem/lymph/immuno: no widespread bruising   Thank you for the opportunity to participate in the care of Ms. Jodoin.  The palliative care team will continue to follow. Please call our office at [phone] if we can be of additional assistance.   Jason Coop, NP DNP, AGPCNP-BC  COVID-19 PATIENT SCREENING TOOL Asked and negative response unless otherwise noted:   Have you had symptoms of covid, tested positive or been in contact with someone with symptoms/positive test in the past 5-10 days?

## 2021-08-08 ENCOUNTER — Telehealth: Payer: Self-pay | Admitting: Family

## 2021-08-08 NOTE — Telephone Encounter (Signed)
Patient sent to access nurse. Weak and out of it , sleeping more than normal.

## 2021-08-08 NOTE — Telephone Encounter (Signed)
Patient has been weak feeling malaise, not been eating just wanting to sleep all time has had some soda, ensure, very little water no food. Last two days she has forgotten to drink ensure and not sure how much water few sips. Advised patient she needs to be evaluated today due to she more than likely dehydrated.  Patient daughter on second phone stated she was taking her mother to [facility] for evaluation.  IO advised that or and urgent care at [facility] that could give fluids. Patient daughter says she wants to take her to [facility].

## 2021-08-09 ENCOUNTER — Inpatient Hospital Stay: Payer: Medicare Other

## 2021-08-09 ENCOUNTER — Inpatient Hospital Stay (HOSPITAL_BASED_OUTPATIENT_CLINIC_OR_DEPARTMENT_OTHER): Payer: Medicare Other | Admitting: Oncology

## 2021-08-09 ENCOUNTER — Other Ambulatory Visit: Payer: Self-pay

## 2021-08-09 ENCOUNTER — Encounter: Payer: Self-pay | Admitting: Oncology

## 2021-08-09 VITALS — BP 122/40 | HR 48 | Temp 94.5°F | Resp 20

## 2021-08-09 DIAGNOSIS — C50919 Malignant neoplasm of unspecified site of unspecified female breast: Secondary | ICD-10-CM | POA: Diagnosis not present

## 2021-08-09 DIAGNOSIS — T451X5A Adverse effect of antineoplastic and immunosuppressive drugs, initial encounter: Secondary | ICD-10-CM | POA: Diagnosis not present

## 2021-08-09 DIAGNOSIS — M8580 Other specified disorders of bone density and structure, unspecified site: Secondary | ICD-10-CM

## 2021-08-09 DIAGNOSIS — D6481 Anemia due to antineoplastic chemotherapy: Secondary | ICD-10-CM

## 2021-08-09 DIAGNOSIS — Z9013 Acquired absence of bilateral breasts and nipples: Secondary | ICD-10-CM | POA: Diagnosis not present

## 2021-08-09 DIAGNOSIS — N179 Acute kidney failure, unspecified: Secondary | ICD-10-CM

## 2021-08-09 DIAGNOSIS — Z79811 Long term (current) use of aromatase inhibitors: Secondary | ICD-10-CM | POA: Diagnosis not present

## 2021-08-09 DIAGNOSIS — N1831 Chronic kidney disease, stage 3a: Secondary | ICD-10-CM | POA: Diagnosis not present

## 2021-08-09 DIAGNOSIS — D631 Anemia in chronic kidney disease: Secondary | ICD-10-CM | POA: Diagnosis not present

## 2021-08-09 DIAGNOSIS — E860 Dehydration: Secondary | ICD-10-CM | POA: Diagnosis not present

## 2021-08-09 DIAGNOSIS — I252 Old myocardial infarction: Secondary | ICD-10-CM | POA: Diagnosis not present

## 2021-08-09 DIAGNOSIS — J90 Pleural effusion, not elsewhere classified: Secondary | ICD-10-CM | POA: Diagnosis not present

## 2021-08-09 DIAGNOSIS — I129 Hypertensive chronic kidney disease with stage 1 through stage 4 chronic kidney disease, or unspecified chronic kidney disease: Secondary | ICD-10-CM | POA: Diagnosis not present

## 2021-08-09 DIAGNOSIS — T451X5D Adverse effect of antineoplastic and immunosuppressive drugs, subsequent encounter: Secondary | ICD-10-CM | POA: Diagnosis not present

## 2021-08-09 DIAGNOSIS — R630 Anorexia: Secondary | ICD-10-CM

## 2021-08-09 DIAGNOSIS — Z89612 Acquired absence of left leg above knee: Secondary | ICD-10-CM | POA: Diagnosis not present

## 2021-08-09 DIAGNOSIS — E1122 Type 2 diabetes mellitus with diabetic chronic kidney disease: Secondary | ICD-10-CM | POA: Diagnosis not present

## 2021-08-09 DIAGNOSIS — Z803 Family history of malignant neoplasm of breast: Secondary | ICD-10-CM | POA: Diagnosis not present

## 2021-08-09 LAB — CBC WITH DIFFERENTIAL/PLATELET
Abs Immature Granulocytes: 0.04 10*3/uL (ref 0.00–0.07)
Basophils Absolute: 0 10*3/uL (ref 0.0–0.1)
Basophils Relative: 1 %
Eosinophils Absolute: 0 10*3/uL (ref 0.0–0.5)
Eosinophils Relative: 0 %
HCT: 29.3 % — ABNORMAL LOW (ref 36.0–46.0)
Hemoglobin: 9.6 g/dL — ABNORMAL LOW (ref 12.0–15.0)
Immature Granulocytes: 1 %
Lymphocytes Relative: 25 %
Lymphs Abs: 1.4 10*3/uL (ref 0.7–4.0)
MCH: 31.9 pg (ref 26.0–34.0)
MCHC: 32.8 g/dL (ref 30.0–36.0)
MCV: 97.3 fL (ref 80.0–100.0)
Monocytes Absolute: 0.6 10*3/uL (ref 0.1–1.0)
Monocytes Relative: 10 %
Neutro Abs: 3.6 10*3/uL (ref 1.7–7.7)
Neutrophils Relative %: 63 %
Platelets: 214 10*3/uL (ref 150–400)
RBC: 3.01 MIL/uL — ABNORMAL LOW (ref 3.87–5.11)
RDW: 13.4 % (ref 11.5–15.5)
WBC: 5.7 10*3/uL (ref 4.0–10.5)
nRBC: 0 % (ref 0.0–0.2)

## 2021-08-09 LAB — COMPREHENSIVE METABOLIC PANEL
ALT: 13 U/L (ref 0–44)
AST: 17 U/L (ref 15–41)
Albumin: 3.6 g/dL (ref 3.5–5.0)
Alkaline Phosphatase: 73 U/L (ref 38–126)
Anion gap: 7 (ref 5–15)
BUN: 33 mg/dL — ABNORMAL HIGH (ref 8–23)
CO2: 19 mmol/L — ABNORMAL LOW (ref 22–32)
Calcium: 8.9 mg/dL (ref 8.9–10.3)
Chloride: 104 mmol/L (ref 98–111)
Creatinine, Ser: 1.74 mg/dL — ABNORMAL HIGH (ref 0.44–1.00)
GFR, Estimated: 27 mL/min — ABNORMAL LOW (ref >60)
Glucose, Bld: 144 mg/dL — ABNORMAL HIGH (ref 70–99)
Potassium: 4.7 mmol/L (ref 3.5–5.1)
Sodium: 130 mmol/L — ABNORMAL LOW (ref 135–145)
Total Bilirubin: 0.4 mg/dL (ref 0.3–1.2)
Total Protein: 6.8 g/dL (ref 6.5–8.1)

## 2021-08-09 MED ORDER — SODIUM CHLORIDE 0.9 % IV SOLN
INTRAVENOUS | Status: AC
  Filled 2021-08-09 (×3): qty 250

## 2021-08-09 MED ORDER — FULVESTRANT 250 MG/5ML IM SOSY
500.0000 mg | PREFILLED_SYRINGE | Freq: Once | INTRAMUSCULAR | Status: AC
  Administered 2021-08-09: 500 mg via INTRAMUSCULAR

## 2021-08-09 NOTE — Progress Notes (Signed)
Patient states she has been weak, extremely fatigued, and having some acid reflux. Patient daughter states she isn't eating well. Example:a a few bites of a chicken leg. Patient daughter states she is kind of "spacey" also. She believes she may be dehydrated. No Bowel movements since 2/11. Patient reports stomach pain but nothing at the moment. Stomach pain been at a 10.

## 2021-08-09 NOTE — Progress Notes (Signed)
Nutrition Follow-up:  Patient with stage IV breast cancer.   Met with patient while receiving IV fluids.  Says that she does not have any appetite.  "I am taking the appetite medicine." (Megace).  Says she has eaten bacon, eggs this morning and drank water and coffee.  Daughter not present during fluids.  Says she is having acid reflux, took gas x.  Also states that she has not had a bowel movement since last week.  Previously was having diarrhea.  Says that she is drinking protein shake.     Medications: megace  Labs: reviewed  Anthropometrics:   Weight not recorded today  87 lb on 1/26 95 lb 12/22 85 lb 10/27   NUTRITION DIAGNOSIS: Underweight continues   INTERVENTION:  Spoke with Dr Tasia Catchings and MD recommended patient take either pepcid 45m 1 time per day over the counter or omeprazole 20 mg 1 time per day over the counter.  Wrote these instructions for patient. Patient verbalized understanding. Encouraged patient to continue shakes and high calorie, high protein foods Home based Palliative care addressing bowel regimen. Continue appetite stimulant      MONITORING, EVALUATION, GOAL: weight trends, intake   NEXT VISIT: Thursday, March 23 during infusion  Aili Casillas B. AZenia Resides RGarvin LMoores HillRegistered Dietitian [phone](mobile)

## 2021-08-09 NOTE — Progress Notes (Signed)
Hematology/Oncology Progress note [phone] [phone]      Patient Care Team: [name], FNP as PCP - General (Family Medicine) Jason Coop, NP as Nurse Practitioner (Hospice and Palliative Medicine) [name], MD as Consulting Physician (Oncology) De Hollingshead, RPH-CPP (Pharmacist) Minna Merritts, MD as Consulting Physician (Cardiology) Lauretta Grill, NP as Nurse Practitioner (Nurse Practitioner) Borders, [name], NP as Nurse Practitioner (Hospice and Palliative Medicine)  REFERRING PROVIDER: [name], FNP  CHIEF COMPLAINTS/REASON FOR VISIT:  Follow up for metastatic breast cancer  HISTORY OF PRESENTING ILLNESS:   Tiffany Bailey is a  88 y.o.  female with PMH listed below was seen in consultation at the request of  [name], FNP  for evaluation of abnormal CT scan. Patient was recently evaluated by primary care provider for complaints of urinary retention, headache, constipation. 02/03/2019 x-ray of abdomen showed large colonic stool volume, nodular densities noted in the right mid lung are up). 02/08/2019 subsequent chest x-ray two-view showed hyperinflation of the lungs compatible with COPD.  Multiple nodular densities project over the mid and lower right lung. CT chest was obtained for further evaluation. 02/17/2019 CT chest without contrast Showed multiple bilateral pulmonary nodules,groundglass opacities. Largest solid nodule at the confluence of the right major and minor fissures measuring 1.6 cm.  Most notable groundglass nodule is of the right upper lobe and measure 1.8 x 0.8 cm, small right pleural effusion with associated atelectasis or consolidation.  Larger solid nodule are highly suspicious for metastatic disease. Bulky right mediastinal and hilar lymph node.  Largest pretracheal nodes measuring 3.5 x 3 cm.  There are additional bulky superior mediastinal, supraclavicular, left subpectoral lymph  nodes.  Largest subpectoral nodes measuring 3.3 x 2.5 cm.  Patient was sent to cancer center for further evaluation.  #Patient reports a history of left breast cancer status post mastectomy.  She is a poor historian.  She is not able to recall the timeframe of breast cancer diagnosis and then treatments.  Denies any chemotherapy or radiation treatments.  #Left lower extremity history of amputation, CKD, History of MI, Sickle cell disease, DM, HTN, anemia.   Patient denies shortness of breath, chest pain, hemoptysis.  Night sweating, abdominal pain. Reports intentional weight loss.  Appetite is poor.  Not eating much. Also have wax and wane, chronic intermittent headache, she takes Tylenol as needed, with some relief.  Denies any nausea vomiting. She sees neurology. 11/10/2018 CT head showed no acute intracranial abnormality or significant interval changes.  Stable atrophy and white matter disease.  Atherosclerosis.  #Patient had a consultation visit with me on 02/19/2019.  At that time he was referred for abnormal CT scan.  PET scan was obtained and I recommend patient to obtain left axillary lymph node biopsy.  Patient/family called back and decided not to proceed with any additional work-up.  #February 2020 started Arimidex # 03/30/2021 - 03/31/2021 Admission due to AKI, felt to be due to dehydration, side effects of Jardiance.  Her glucose has been low and diabetes regimen has been adjusted. [name] was held.   INTERVAL HISTORY Tiffany Bailey is a 88 y.o. female who has above history reviewed by me today presents for follow up visit for management of metastatic breast cancer. 07/12/2021 - 07/27/2021, patient tried dose reduced abemaciclib 50 mg twice daily.  Discontinued due to poor tolerance due to diarrhea.  Patient was seen by symptom management on 07/30/2021, had IV fluid.  Recommend to restart Megace. Patient  presents for follow-up.  Was accompanied by daughter. She feels weak.  Diarrhea has  stopped since the stop of abemaciclib.  Now she has constipation .  Appetite is not good.  She was not weighed today  + Acid reflux   Review of Systems  Constitutional:  Positive for appetite change and fatigue. Negative for chills, fever and unexpected weight change.  HENT:   Negative for hearing loss and voice change.   Eyes:  Negative for eye problems.  Respiratory:  Negative for chest tightness, cough and shortness of breath.   Cardiovascular:  Negative for chest pain.  Gastrointestinal:  Negative for abdominal distention, abdominal pain, blood in stool and constipation.  Endocrine: Negative for hot flashes.  Genitourinary:  Negative for difficulty urinating and frequency.   Musculoskeletal:  Negative for arthralgias.       Left lower extremity history of amputation  Skin:  Negative for itching and rash.  Neurological:  Negative for extremity weakness and headaches.  Hematological:  Negative for adenopathy.  Psychiatric/Behavioral:  Negative for confusion.    MEDICAL HISTORY:  Past Medical History:  Diagnosis Date   Anemia    Anginal pain ([place])    Arthritis    Breast cancer ([place]) 07/19/2019   CKD (chronic kidney disease), stage III ([place])    Coronary artery disease    a. 2002 CABG x 3 (LIMA-LAD, SVG-D1, SVG-RCA); b. s/p multiple PCI's.   Diabetes mellitus without complication ([place])    Diastolic dysfunction    a. 12/2017 Echo: EF 55-60%, no rwma, Gr1 DD. Triv AI. Mild-mod MR/TR. PASP 43mHg.   Edema    Failure to thrive in adult    GERD (gastroesophageal reflux disease)    [place]   History of breast cancer    HOH (hard of hearing)    Hyperlipidemia    Hypertension    [facility] infarction [facility]    [place]) [place]    a. s/p L AKA; b. 12/2018 PTA R peroneal & PTA/Stenting R SFA.   Palpitations     SURGICAL HISTORY: Past Surgical History:  Procedure Laterality Date   ABDOMINAL HYSTERECTOMY     ABOVE KNEE LEG  AMPUTATION Left 2013   ANTERIOR VITRECTOMY Left 11/16/2015   Procedure: ANTERIOR VITRECTOMY;  Surgeon: BEulogio Bear MD;  Location: [facility] ORS;  Service: Ophthalmology;  Laterality: Left;   BLADDER SURGERY     CATARACT EXTRACTION W/PHACO Left 11/16/2015   Procedure: CATARACT EXTRACTION PHACO AND INTRAOCULAR LENS PLACEMENT (IOC);  Surgeon: BEulogio Bear MD;  Location: [facility] ORS;  Service: Ophthalmology;  Laterality: Left;  Lot # [id number] [place] 01:22.8 AP%:12.6 CDE: 10.46   CATARACT EXTRACTION W/PHACO Right 08/22/2016   Procedure: CATARACT EXTRACTION PHACO AND INTRAOCULAR LENS PLACEMENT (IOC);  Surgeon: BEulogio Bear MD;  Location: [facility] ORS;  Service: Ophthalmology;  Laterality: Right;  Lot # [id number] [place] 00:52.1 AP%:8.7 CDE: 4.99    CORONARY ANGIOPLASTY     STENT   CORONARY ARTERY BYPASS GRAFT     LOWER EXTREMITY ANGIOGRAPHY Right 01/14/2019   Procedure: LOWER EXTREMITY ANGIOGRAPHY;  Surgeon: DAlgernon Huxley MD;  [facility];  Service: Cardiovascular;  Laterality: Right;   MASTECTOMY     TUMOR REMOVAL     ABDOMINAL    SOCIAL HISTORY: Social History   Socioeconomic History   Marital status: Divorced    Spouse name: Not on file   Number of children: 7   Years of education: Not on  file   Highest education level: Not on file  Occupational History   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  Tobacco Use   Smoking status: Never   Smokeless tobacco: Never  Vaping Use   Vaping Use: Never used  Substance and Sexual Activity   Alcohol use: No   Drug use: No   Sexual activity: Not Currently  Other Topics Concern   Not on file  Social History Narrative   Not on file   Social Determinants of Health   Financial Resource Strain: Low Risk    Difficulty of Paying Living Expenses: Not hard at all  Food Insecurity: Not on file  Transportation Needs: Not on file  Physical Activity: Not on file  Stress: Not on file  Social Connections: Not on file   Intimate Partner Violence: Not on file    FAMILY HISTORY: Family History  Problem Relation Age of Onset   Sudden death Mother    Arthritis Father    Stroke Father    Hypertension Sister    Hypertension Sister    Diabetes Grandchild     ALLERGIES:  is allergic to amoxicillin, gabapentin, mirtazapine, and other.  MEDICATIONS:  Current Outpatient Medications  Medication Sig Dispense Refill   amLODipine (NORVASC) 10 MG tablet Take 1 tablet (10 mg total) by mouth daily. 90 tablet 1   aspirin EC 81 MG tablet Take 1 tablet (81 mg total) by mouth daily. 30 tablet 5   atorvastatin (LIPITOR) 40 MG tablet TAKE 1 TABLET BY MOUTH  DAILY 90 tablet 3   blood glucose meter kit and supplies KIT Dispense based on patient and insurance preference. Use up to four times daily as directed. (FOR ICD-9 250.00, 250.01). 1 each 0   Blood Glucose Monitoring Suppl (ONETOUCH VERIO REFLECT) w/Device KIT 1 Device by Does not apply route daily. 1 kit 0   cloNIDine (CATAPRES) 0.1 MG tablet Take 1 tablet (0.1 mg total) by mouth 2 (two) times daily. 60 tablet 2   clopidogrel (PLAVIX) 75 MG tablet TAKE 1 TABLET BY MOUTH  DAILY 30 tablet 0   Continuous Blood Gluc Receiver (FREESTYLE LIBRE 2 READER) DEVI Use to check blood sugar continuously 1 each 1   Continuous Blood Gluc Sensor (FREESTYLE LIBRE 2 SENSOR) MISC Apply 1 each topically every 14 (fourteen) days. 2 each 11   feeding supplement (ENSURE ENLIVE / ENSURE PLUS) LIQD Take 237 mLs by mouth 2 (two) times daily between meals.     glucose blood (ONE TOUCH TEST STRIPS) test strip Use to test blood sugar twice daily 200 each 3   hydrALAZINE (APRESOLINE) 10 MG tablet Take 1 tablet (10 mg total) by mouth in the morning and at bedtime. 180 tablet 3   HYDROcodone-acetaminophen (NORCO) 5-325 MG tablet Take 1 tablet by mouth every 8 (eight) hours as needed for moderate pain. 30 tablet 0   Insulin Glargine (TOUJEO SOLOSTAR [place]) Inject 4 Units into the skin daily.     Insulin  Pen Needle (BD PEN NEEDLE NANO U/F) 32G X 4 MM MISC USE 1 DAILY 100 each 12   Lancets (ONETOUCH ULTRASOFT) lancets 1 each by Other route 2 (two) times daily. 90 day supply for [employer].  Dx E11.9 300 each 12   loperamide (IMODIUM) 2 MG capsule Take 1 capsule (2 mg total) by mouth See admin instructions. Initial: 4 mg, followed by 2 mg after each loose stool; maximum: 16 mg/day 60 capsule 1   megestrol (MEGACE ES)  625 MG/5ML suspension Take 5 mLs (625 mg total) by mouth daily. 150 mL 0   Metoprolol Tartrate 37.5 MG TABS Take 1 tablet by mouth 2 (two) times daily.     nitroGLYCERIN (NITROSTAT) 0.4 MG SL tablet Place 1 tablet (0.4 mg total) under the tongue every 5 (five) minutes as needed for chest pain. 25 tablet 1   ondansetron (ZOFRAN) 4 MG tablet Take 1 tablet (4 mg total) by mouth every 6 (six) hours as needed for nausea or vomiting. 60 tablet 1   valsartan (DIOVAN) 160 MG tablet Take 1 tablet (160 mg total) by mouth daily. 90 tablet 3   vitamin C (ASCORBIC ACID) 250 MG tablet Take 250 mg by mouth daily.     abemaciclib (VERZENIO) 50 MG tablet Take 1 tablet (50 mg total) by mouth 2 (two) times daily. (Patient not taking: Reported on 08/09/2021) 56 tablet 0   No current facility-administered medications for this visit.     PHYSICAL EXAMINATION: ECOG PERFORMANCE STATUS: 3 - Symptomatic, >50% confined to bed Vitals:   08/09/21 1409  BP: (!) 122/40  Pulse: (!) 48  Resp: 20  Temp: (!) 94.5 F (34.7 C)  SpO2: 100%   There were no vitals filed for this visit.   Physical Exam Constitutional:      General: She is not in acute distress.    Comments: She sits in the wheel chair.  Frail appearance  HENT:     Head: Normocephalic and atraumatic.  Eyes:     General: No scleral icterus.    Pupils: Pupils are equal, round, and reactive to light.  Cardiovascular:     Rate and Rhythm: Normal rate and regular rhythm.     Heart sounds: Normal heart sounds.  Pulmonary:      Effort: Pulmonary effort is normal. No respiratory distress.     Breath sounds: No wheezing.     Comments: Decreased breath sound right lower lobe.  Good air entry on the left base.  Abdominal:     General: Bowel sounds are normal. There is no distension.     Palpations: Abdomen is soft.  Musculoskeletal:        General: No deformity. Normal range of motion.     Cervical back: Normal range of motion and neck supple.     Comments: Left lower extremity history of amputation.   Skin:    General: Skin is warm and dry.     Findings: No erythema or rash.  Neurological:     Mental Status: She is alert and oriented to person, place, and time. Mental status is at baseline.     Cranial Nerves: No cranial nerve deficit.     Coordination: Coordination normal.  Psychiatric:        Mood and Affect: Mood normal.     LABORATORY DATA:  I have reviewed the data as listed Lab Results  Component Value Date   WBC 5.7 08/09/2021   HGB 9.6 (L) 08/09/2021   HCT 29.3 (L) 08/09/2021   MCV 97.3 08/09/2021   PLT 214 08/09/2021   Recent Labs    03/30/21 1650 03/31/21 0428 07/12/21 1305 07/30/21 1053 08/09/21 1350  NA  --    < > 134* 124* 130*  K  --    < > 4.4 4.5 4.7  CL  --    < > 104 97* 104  CO2  --    < > 24 20* 19*  GLUCOSE  --    < >  208* 154* 144*  BUN  --    < > 29* 25* 33*  CREATININE  --    < > 1.13* 1.30* 1.74*  CALCIUM  --    < > 8.9 8.5* 8.9  GFRNONAA  --    < > 46* 39* 27*  PROT 6.5   < > 7.0 6.4* 6.8  ALBUMIN 3.1*   < > 3.7 3.4* 3.6  AST 165*   < > _0 ALT 167*   < > _1 ALKPHOS 89   < > 95 99 73  BILITOT 0.7   < > 0.4 0.1* 0.4  BILIDIR 0.1  --   --   --   --   IBILI 0.6  --   --   --   --    < > = values in this interval not displayed.    Iron/TIBC/Ferritin/ %Sat    Component Value Date/Time   IRON 33 01/12/2021 1133   TIBC 220 (L) 01/12/2021 1133   FERRITIN 74 01/12/2021 1133   IRONPCTSAT 15 01/12/2021 1133   IRONPCTSAT 27 06/19/2018 1006       RADIOGRAPHIC STUDIES: I have personally reviewed the radiological images as listed and agreed with the findings in the report. CT CHEST ABDOMEN PELVIS WO CONTRAST  Result Date: 07/06/2021 CLINICAL DATA:  Metastatic breast cancer EXAM: CT CHEST, ABDOMEN AND PELVIS WITHOUT CONTRAST TECHNIQUE: Multidetector CT imaging of the chest, abdomen and pelvis was performed following the standard protocol without IV contrast. RADIATION DOSE REDUCTION: This exam was performed according to the departmental dose-optimization program which includes automated exposure control, adjustment of the mA and/or kV according to patient size and/or use of iterative reconstruction technique. COMPARISON:  03/01/2021 FINDINGS: CT CHEST FINDINGS Cardiovascular: Aortic atherosclerosis. Normal heart size. Extensive 3 vessel coronary artery calcifications and stents. No pericardial effusion. Mediastinum/Nodes: Interval decrease in size of a pretracheal lymph node, measuring 3.0 x 2.4 cm, previously 3.5 x 2.8 cm (series 2, image 26). Interval decrease in size of a subcarinal lymph node measuring 2.5 x 1.8 cm, previously 2.7 x 2.4 cm (series 2, image 30). Thyroid gland, trachea, and esophagus demonstrate no significant findings. Lungs/Pleura: Unchanged small right pleural effusion. Diffuse bilateral bronchial wall thickening. Multiple bilateral ground-glass and subsolid pulmonary nodules are unchanged, largest subsolid nodule of the right upper lobe measuring 1.7 x 1.1 cm (series 3, image 69). Additional ground-glass opacity of the lingula measures 1.4 x 0.9 cm, unchanged (series 3, image 75). Multiple solid pulmonary nodules of the right lung are unchanged, index nodule of the medial segment right middle lobe measuring 0.7 x 0.5 cm (series 3, image 103), fissural nodule of the posterior right upper lobe measuring 0.7 x 0.5 cm (series 3, image 54). Musculoskeletal: No chest wall mass or suspicious osseous lesions identified. CT ABDOMEN  PELVIS FINDINGS Hepatobiliary: No solid liver abnormality is seen. Status post cholecystectomy. No gallbladder wall thickening, or biliary dilatation. Pancreas: Unremarkable. No pancreatic ductal dilatation or surrounding inflammatory changes. Spleen: Normal in size without significant abnormality. Adrenals/Urinary Tract: Adrenal glands are unremarkable. Numerous bilateral renal calculi and or renal vascular calcifications. No hydronephrosis. Bladder is unremarkable. Stomach/Bowel: Stomach is within normal limits. Appendix appears normal. No evidence of bowel wall thickening, distention, or inflammatory changes. Vascular/Lymphatic: No significant vascular findings are present. No enlarged abdominal or pelvic lymph nodes. Reproductive: Status post hysterectomy. Other: No abdominal wall hernia or abnormality. No ascites. Musculoskeletal: No acute osseous findings. Unchanged superior endplate deformity of L5 (  series 6, image 83). IMPRESSION: 1. Interval decrease in size of pretracheal and subcarinal lymph nodes, consistent with treatment response of nodal metastatic disease. 2. Multiple multiple solid nodules of the right lung are unchanged. 3. Additional bilateral ground-glass and subsolid pulmonary nodules are unchanged. 4. Unchanged small right pleural effusion. 5. No noncontrast CT evidence of new metastatic disease. 6. Numerous bilateral renal calculi and or renal vascular calcifications. No hydronephrosis. Aortic Atherosclerosis (ICD10-I70.0). Electronically Signed   By: Delanna Ahmadi M.D.   On: 07/06/2021 17:22      ASSESSMENT & PLAN:  1. Metastatic breast cancer ([name])   2. Dehydration   3. Anemia due to antineoplastic chemotherapy   4. AKI (acute kidney injury) ([place])   5. Poor appetite     #Metastatic breast cancer,  ER+, PR- HER2 negative.  Labs reviewed and discussed with patient. 07/06/2021 CT showed interval decrease in the size of pretracheal/subcarinal lymph node.  Consistent with treatment  response. She tolerates abemaciclib poorly.  Recommend patient to remain off abemaciclib. Proceed with fulvestrant monthly.  1 dose today.  #AKI on CKD, patient will be provided IV normal saline 500 cc x 1 today.  Patient is not interested in going to ER for admission. Encourage oral hydration.    #Poor appetite, continue Megace for appetite stimulant. #Acid reflux, recommend patient to take over-the-counter Pepcid 20 mg daily or omeprazole 20 mg daily.  #Hyponatremia has improved.  Sodium is 130 today.  #uncontrolled brittle diabetes.follow up with pcp.  Glucose 144 today.  #Anemia due to chronic kidney disease/chemotherapy.  Hemoglobin 9.6.  Stable.  Monitor.  Patient will repeat blood work and follow-up with nurse practitioner for IV fluids session. Follow-up in 4 weeks lab MD fulvestrant.  All questions were answered. The patient knows to call the clinic with any problems questions or concerns.   Follow up in 4 weeks, Lab MD + Fulvestrant  [name], MD, PhD 08/09/2021

## 2021-08-09 NOTE — Patient Instructions (Signed)

## 2021-08-10 NOTE — Telephone Encounter (Signed)
Tiffany Bailey, I do not see that patient was seen and feels per ED.  Please check on her and see how she is doing. See Ludger Nutting call note

## 2021-08-10 NOTE — Telephone Encounter (Signed)
noted 

## 2021-08-14 ENCOUNTER — Other Ambulatory Visit (HOSPITAL_COMMUNITY): Payer: Self-pay

## 2021-08-14 DIAGNOSIS — Z794 Long term (current) use of insulin: Secondary | ICD-10-CM | POA: Diagnosis not present

## 2021-08-14 DIAGNOSIS — E1159 Type 2 diabetes mellitus with other circulatory complications: Secondary | ICD-10-CM | POA: Diagnosis not present

## 2021-08-14 DIAGNOSIS — E1122 Type 2 diabetes mellitus with diabetic chronic kidney disease: Secondary | ICD-10-CM

## 2021-08-14 DIAGNOSIS — N183 Chronic kidney disease, stage 3 unspecified: Secondary | ICD-10-CM

## 2021-08-15 ENCOUNTER — Telehealth: Payer: Self-pay

## 2021-08-15 ENCOUNTER — Other Ambulatory Visit (HOSPITAL_COMMUNITY): Payer: Self-pay

## 2021-08-15 ENCOUNTER — Ambulatory Visit (INDEPENDENT_AMBULATORY_CARE_PROVIDER_SITE_OTHER): Payer: Medicare Other | Admitting: Pharmacist

## 2021-08-15 DIAGNOSIS — N183 Chronic kidney disease, stage 3 unspecified: Secondary | ICD-10-CM

## 2021-08-15 DIAGNOSIS — Z853 Personal history of malignant neoplasm of breast: Secondary | ICD-10-CM

## 2021-08-15 DIAGNOSIS — Z794 Long term (current) use of insulin: Secondary | ICD-10-CM

## 2021-08-15 DIAGNOSIS — E1159 Type 2 diabetes mellitus with other circulatory complications: Secondary | ICD-10-CM

## 2021-08-15 NOTE — Progress Notes (Signed)
Hematology/Oncology Progress Note [phone] [phone]   Patient Care Team: [name], FNP as PCP - General (Family Medicine) Jason Coop, NP as Nurse Practitioner (Hospice and Palliative Medicine) [name], MD as Consulting Physician (Oncology) De Hollingshead, RPH-CPP (Pharmacist) Minna Merritts, MD as Consulting Physician (Cardiology) Lauretta Grill, NP as Nurse Practitioner (Nurse Practitioner) Borders, [name], NP as Nurse Practitioner (Hospice and Palliative Medicine)  REFERRING PROVIDER: [name], FNP   CHIEF COMPLAINTS/REASON FOR VISIT:  Follow up for metastatic breast cancer  HISTORY OF PRESENTING ILLNESS: Tiffany Bailey is a  88 y.o.  female with PMH listed below was seen in consultation at the request of  [name], FNP  for evaluation of abnormal CT scan. Patient was recently evaluated by primary care provider for complaints of urinary retention, headache, constipation. 02/03/2019 x-ray of abdomen showed large colonic stool volume, nodular densities noted in the right mid lung are up). 02/08/2019 subsequent chest x-ray two-view showed hyperinflation of the lungs compatible with COPD.  Multiple nodular densities project over the mid and lower right lung. CT chest was obtained for further evaluation. 02/17/2019 CT chest without contrast Showed multiple bilateral pulmonary nodules,groundglass opacities. Largest solid nodule at the confluence of the right major and minor fissures measuring 1.6 cm.  Most notable groundglass nodule is of the right upper lobe and measure 1.8 x 0.8 cm, small right pleural effusion with associated atelectasis or consolidation.  Larger solid nodule are highly suspicious for metastatic disease. Bulky right mediastinal and hilar lymph node.  Largest pretracheal nodes measuring 3.5 x 3 cm.  There are additional bulky superior mediastinal, supraclavicular, left subpectoral lymph nodes.   Largest subpectoral nodes measuring 3.3 x 2.5 cm.  Patient was sent to cancer center for further evaluation.  #Patient reports a history of left breast cancer status post mastectomy.  She is a poor historian.  She is not able to recall the timeframe of breast cancer diagnosis and then treatments.  Denies any chemotherapy or radiation treatments.  #Left lower extremity history of amputation, CKD, History of MI, Sickle cell disease, DM, HTN, anemia.   Patient denies shortness of breath, chest pain, hemoptysis.  Night sweating, abdominal pain. Reports intentional weight loss.  Appetite is poor.  Not eating much. Also have wax and wane, chronic intermittent headache, she takes Tylenol as needed, with some relief.  Denies any nausea vomiting. She sees neurology. 11/10/2018 CT head showed no acute intracranial abnormality or significant interval changes.  Stable atrophy and white matter disease.  Atherosclerosis.  #Patient had a consultation visit with me on 02/19/2019.  At that time he was referred for abnormal CT scan.  PET scan was obtained and I recommend patient to obtain left axillary lymph node biopsy.  Patient/family called back and decided not to proceed with any additional work-up.  #February 2020 started Arimidex # 03/30/2021 - 03/31/2021 Admission due to AKI, felt to be due to dehydration, side effects of Jardiance.  Her glucose has been low and diabetes regimen has been adjusted. [name] was held.   INTERVAL HISTORY Tiffany Bailey is a 88 y.o. female with metastatic breast cancer, currently on fulvestrant who returns to clinic for follow up. She reports gaining weight and appetite is good. Hasn't had bowel movement in approximately 3 weeks. Has some aching and cramps. Previously experienced diarrhea with abemaciclib. Has taken a few doses of miralax and enema without results. Complains of acid reflux. No nausea or vomiting.   Review  of Systems  Constitutional:  Positive for appetite change  and fatigue. Negative for chills, fever and unexpected weight change.  HENT:   Negative for hearing loss and voice change.   Eyes:  Negative for eye problems.  Respiratory:  Negative for chest tightness, cough and shortness of breath.   Cardiovascular:  Negative for chest pain.  Gastrointestinal:  Positive for abdominal distention and constipation. Negative for abdominal pain, blood in stool, diarrhea, nausea, rectal pain and vomiting.  Endocrine: Negative for hot flashes.  Genitourinary:  Negative for difficulty urinating, frequency, pelvic pain and vaginal bleeding.   Musculoskeletal:  Negative for arthralgias and flank pain.       Left lower extremity history of amputation  Skin:  Negative for itching and rash.  Neurological:  Negative for extremity weakness and headaches.  Hematological:  Negative for adenopathy.  Psychiatric/Behavioral:  Negative for confusion and depression. The patient is not nervous/anxious.    MEDICAL HISTORY:  Past Medical History:  Diagnosis Date   Anemia    Anginal pain ([place])    Arthritis    Breast cancer ([place]) 07/19/2019   CKD (chronic kidney disease), stage III ([place])    Coronary artery disease    a. 2002 CABG x 3 (LIMA-LAD, SVG-D1, SVG-RCA); b. s/p multiple PCI's.   Diabetes mellitus without complication ([place])    Diastolic dysfunction    a. 12/2017 Echo: EF 55-60%, no rwma, Gr1 DD. Triv AI. Mild-mod MR/TR. PASP 1mHg.   Edema    Failure to thrive in adult    GERD (gastroesophageal reflux disease)    Hb-SS disease without crisis ([place] 10/23/2017   History of breast cancer    HOH (hard of hearing)    Hyperlipidemia    Hypertension    Myocardial infarction ([facility]    PAD (peripheral artery disease) ([place]    a. s/p L AKA; b. 12/2018 PTA R peroneal & PTA/Stenting R SFA.   Palpitations     SURGICAL HISTORY: Past Surgical History:  Procedure Laterality Date   ABDOMINAL HYSTERECTOMY     ABOVE KNEE LEG AMPUTATION Left 2013   ANTERIOR VITRECTOMY Left  11/16/2015   Procedure: ANTERIOR VITRECTOMY;  Surgeon: BEulogio Bear MD;  Location: [facility] ORS;  Service: Ophthalmology;  Laterality: Left;   BLADDER SURGERY     CATARACT EXTRACTION W/PHACO Left 11/16/2015   Procedure: CATARACT EXTRACTION PHACO AND INTRAOCULAR LENS PLACEMENT (IOC);  Surgeon: BEulogio Bear MD;  Location: [facility] ORS;  Service: Ophthalmology;  Laterality: Left;  Lot # [id number] [place] 01:22.8 AP%:12.6 CDE: 10.46   CATARACT EXTRACTION W/PHACO Right 08/22/2016   Procedure: CATARACT EXTRACTION PHACO AND INTRAOCULAR LENS PLACEMENT (IOC);  Surgeon: BEulogio Bear MD;  Location: [facility] ORS;  Service: Ophthalmology;  Laterality: Right;  Lot # [id number] [place] 00:52.1 AP%:8.7 CDE: 4.99    CORONARY ANGIOPLASTY     STENT   CORONARY ARTERY BYPASS GRAFT     LOWER EXTREMITY ANGIOGRAPHY Right 01/14/2019   Procedure: LOWER EXTREMITY ANGIOGRAPHY;  Surgeon: DAlgernon Huxley MD;  [facility];  Service: Cardiovascular;  Laterality: Right;   MASTECTOMY     TUMOR REMOVAL     ABDOMINAL    SOCIAL HISTORY: Social History   Socioeconomic History   Marital status: Divorced    Spouse name: Not on file   Number of children: 7   Years of education: Not on file   Highest education level: Not on file  Occupational History   Occupation: retired    Comment: [employer]  Tobacco Use   Smoking status: Never   Smokeless tobacco: Never  Vaping Use   Vaping Use: Never used  Substance and Sexual Activity   Alcohol use: No   Drug use: No   Sexual activity: Not Currently  Other Topics Concern   Not on file  Social History Narrative   Not on file   Social Determinants of Health   Financial Resource Strain: Low Risk    Difficulty of Paying Living Expenses: Not hard at all  Food Insecurity: Not on file  Transportation Needs: Not on file  Physical Activity: Not on file  Stress: Not on file  Social Connections: Not on file  Intimate Partner Violence: Not on file    FAMILY  HISTORY: Family History  Problem Relation Age of Onset   Sudden death Mother    Arthritis Father    Stroke Father    Hypertension Sister    Hypertension Sister    Diabetes Grandchild     ALLERGIES:  is allergic to amoxicillin, gabapentin, mirtazapine, and other.  MEDICATIONS:  Current Outpatient Medications  Medication Sig Dispense Refill   abemaciclib (VERZENIO) 50 MG tablet Take 1 tablet (50 mg total) by mouth 2 (two) times daily. (Patient not taking: Reported on 08/09/2021) 56 tablet 0   amLODipine (NORVASC) 10 MG tablet Take 1 tablet (10 mg total) by mouth daily. 90 tablet 1   aspirin EC 81 MG tablet Take 1 tablet (81 mg total) by mouth daily. 30 tablet 5   atorvastatin (LIPITOR) 40 MG tablet TAKE 1 TABLET BY MOUTH  DAILY 90 tablet 3   blood glucose meter kit and supplies KIT Dispense based on patient and insurance preference. Use up to four times daily as directed. (FOR ICD-9 250.00, 250.01). 1 each 0   Blood Glucose Monitoring Suppl (ONETOUCH VERIO REFLECT) w/Device KIT 1 Device by Does not apply route daily. 1 kit 0   cloNIDine (CATAPRES) 0.1 MG tablet Take 1 tablet (0.1 mg total) by mouth 2 (two) times daily. 60 tablet 2   clopidogrel (PLAVIX) 75 MG tablet TAKE 1 TABLET BY MOUTH  DAILY 30 tablet 0   Continuous Blood Gluc Receiver (FREESTYLE LIBRE 2 READER) DEVI Use to check blood sugar continuously 1 each 1   Continuous Blood Gluc Sensor (FREESTYLE LIBRE 2 SENSOR) MISC Apply 1 each topically every 14 (fourteen) days. 2 each 11   feeding supplement (ENSURE ENLIVE / ENSURE PLUS) LIQD Take 237 mLs by mouth 2 (two) times daily between meals.     glucose blood (ONE TOUCH TEST STRIPS) test strip Use to test blood sugar twice daily 200 each 3   hydrALAZINE (APRESOLINE) 10 MG tablet Take 1 tablet (10 mg total) by mouth in the morning and at bedtime. 180 tablet 3   HYDROcodone-acetaminophen (NORCO) 5-325 MG tablet Take 1 tablet by mouth every 8 (eight) hours as needed for moderate pain. 30  tablet 0   Insulin Glargine (TOUJEO SOLOSTAR [place]) Inject 4 Units into the skin daily.     Insulin Pen Needle (BD PEN NEEDLE NANO U/F) 32G X 4 MM MISC USE 1 DAILY 100 each 12   Lancets (ONETOUCH ULTRASOFT) lancets 1 each by Other route 2 (two) times daily. 90 day supply for [employer].  Dx E11.9 300 each 12   loperamide (IMODIUM) 2 MG capsule Take 1 capsule (2 mg total) by mouth See admin instructions. Initial: 4 mg, followed by 2 mg after each loose stool; maximum: 16 mg/day 60 capsule  1   megestrol (MEGACE ES) 625 MG/5ML suspension Take 5 mLs (625 mg total) by mouth daily. 150 mL 0   Metoprolol Tartrate 37.5 MG TABS Take 1 tablet by mouth 2 (two) times daily.     nitroGLYCERIN (NITROSTAT) 0.4 MG SL tablet Place 1 tablet (0.4 mg total) under the tongue every 5 (five) minutes as needed for chest pain. 25 tablet 1   ondansetron (ZOFRAN) 4 MG tablet Take 1 tablet (4 mg total) by mouth every 6 (six) hours as needed for nausea or vomiting. 60 tablet 1   valsartan (DIOVAN) 160 MG tablet Take 1 tablet (160 mg total) by mouth daily. 90 tablet 3   vitamin C (ASCORBIC ACID) 250 MG tablet Take 250 mg by mouth daily.     No current facility-administered medications for this visit.     PHYSICAL EXAMINATION: ECOG PERFORMANCE STATUS: 3 - Symptomatic, >50% confined to bed Vitals:   08/16/21 0950  BP: 134/63  Pulse: (!) 56  Resp: 14  Temp: (!) 97.4 F (36.3 C)  SpO2: 97%   Filed Weights   08/16/21 0950  Weight: 94 lb 8 oz (42.9 kg)    Physical Exam Constitutional:      General: She is not in acute distress.    Comments: She sits in the wheel chair.  Frail appearance  Eyes:     General: No scleral icterus. Cardiovascular:     Rate and Rhythm: Normal rate and regular rhythm.     Heart sounds: Normal heart sounds.  Pulmonary:     Effort: No respiratory distress.     Breath sounds: No wheezing.  Abdominal:     General: There is distension.     Palpations: Abdomen is soft.      Tenderness: There is no abdominal tenderness. There is no guarding or rebound.  Musculoskeletal:     Comments: Left lower extremity history of amputation.   Skin:    General: Skin is warm and dry.  Neurological:     Mental Status: She is alert and oriented to person, place, and time. Mental status is at baseline.  Psychiatric:        Mood and Affect: Affect is flat.     LABORATORY DATA:  I have reviewed the data as listed Lab Results  Component Value Date   WBC 5.7 08/09/2021   HGB 9.6 (L) 08/09/2021   HCT 29.3 (L) 08/09/2021   MCV 97.3 08/09/2021   PLT 214 08/09/2021   Recent Labs    03/30/21 1650 03/31/21 0428 07/12/21 1305 07/30/21 1053 08/09/21 1350  NA  --    < > 134* 124* 130*  K  --    < > 4.4 4.5 4.7  CL  --    < > 104 97* 104  CO2  --    < > 24 20* 19*  GLUCOSE  --    < > 208* 154* 144*  BUN  --    < > 29* 25* 33*  CREATININE  --    < > 1.13* 1.30* 1.74*  CALCIUM  --    < > 8.9 8.5* 8.9  GFRNONAA  --    < > 46* 39* 27*  PROT 6.5   < > 7.0 6.4* 6.8  ALBUMIN 3.1*   < > 3.7 3.4* 3.6  AST 165*   < > _0 ALT 167*   < > _1 ALKPHOS 89   < > 95 99 73  BILITOT 0.7   < > 0.4 0.1* 0.4  BILIDIR 0.1  --   --   --   --   IBILI 0.6  --   --   --   --    < > = values in this interval not displayed.    Iron/TIBC/Ferritin/ %Sat    Component Value Date/Time   IRON 33 01/12/2021 1133   TIBC 220 (L) 01/12/2021 1133   FERRITIN 74 01/12/2021 1133   IRONPCTSAT 15 01/12/2021 1133   IRONPCTSAT 27 06/19/2018 1006      RADIOGRAPHIC STUDIES: I have personally reviewed the radiological images as listed and agreed with the findings in the report. CT CHEST ABDOMEN PELVIS WO CONTRAST  Result Date: 07/06/2021 CLINICAL DATA:  Metastatic breast cancer EXAM: CT CHEST, ABDOMEN AND PELVIS WITHOUT CONTRAST TECHNIQUE: Multidetector CT imaging of the chest, abdomen and pelvis was performed following the standard protocol without IV contrast. RADIATION DOSE REDUCTION: This  exam was performed according to the departmental dose-optimization program which includes automated exposure control, adjustment of the mA and/or kV according to patient size and/or use of iterative reconstruction technique. COMPARISON:  03/01/2021 FINDINGS: CT CHEST FINDINGS Cardiovascular: Aortic atherosclerosis. Normal heart size. Extensive 3 vessel coronary artery calcifications and stents. No pericardial effusion. Mediastinum/Nodes: Interval decrease in size of a pretracheal lymph node, measuring 3.0 x 2.4 cm, previously 3.5 x 2.8 cm (series 2, image 26). Interval decrease in size of a subcarinal lymph node measuring 2.5 x 1.8 cm, previously 2.7 x 2.4 cm (series 2, image 30). Thyroid gland, trachea, and esophagus demonstrate no significant findings. Lungs/Pleura: Unchanged small right pleural effusion. Diffuse bilateral bronchial wall thickening. Multiple bilateral ground-glass and subsolid pulmonary nodules are unchanged, largest subsolid nodule of the right upper lobe measuring 1.7 x 1.1 cm (series 3, image 69). Additional ground-glass opacity of the lingula measures 1.4 x 0.9 cm, unchanged (series 3, image 75). Multiple solid pulmonary nodules of the right lung are unchanged, index nodule of the medial segment right middle lobe measuring 0.7 x 0.5 cm (series 3, image 103), fissural nodule of the posterior right upper lobe measuring 0.7 x 0.5 cm (series 3, image 54). Musculoskeletal: No chest wall mass or suspicious osseous lesions identified. CT ABDOMEN PELVIS FINDINGS Hepatobiliary: No solid liver abnormality is seen. Status post cholecystectomy. No gallbladder wall thickening, or biliary dilatation. Pancreas: Unremarkable. No pancreatic ductal dilatation or surrounding inflammatory changes. Spleen: Normal in size without significant abnormality. Adrenals/Urinary Tract: Adrenal glands are unremarkable. Numerous bilateral renal calculi and or renal vascular calcifications. No hydronephrosis. Bladder is  unremarkable. Stomach/Bowel: Stomach is within normal limits. Appendix appears normal. No evidence of bowel wall thickening, distention, or inflammatory changes. Vascular/Lymphatic: No significant vascular findings are present. No enlarged abdominal or pelvic lymph nodes. Reproductive: Status post hysterectomy. Other: No abdominal wall hernia or abnormality. No ascites. Musculoskeletal: No acute osseous findings. Unchanged superior endplate deformity of L5 (series 6, image 83). IMPRESSION: 1. Interval decrease in size of pretracheal and subcarinal lymph nodes, consistent with treatment response of nodal metastatic disease. 2. Multiple multiple solid nodules of the right lung are unchanged. 3. Additional bilateral ground-glass and subsolid pulmonary nodules are unchanged. 4. Unchanged small right pleural effusion. 5. No noncontrast CT evidence of new metastatic disease. 6. Numerous bilateral renal calculi and or renal vascular calcifications. No hydronephrosis. Aortic Atherosclerosis (ICD10-I70.0). Electronically Signed   By: Delanna Ahmadi M.D.   On: 07/06/2021 17:22      ASSESSMENT & PLAN:  No diagnosis found.  Metastatic breast cancer,  ER+, PR- HER2 negative. 07/06/2021 CT showed interval decrease in the size of pretracheal/subcarinal lymph node.  Consistent with treatment response.However, tolerated abemaciclib poorly. Discotninued. On monthly fulvestrant since 03/16/21. Tolerating better. Last dose 08/09/21.  AKI- acute on chronic. Worse. Cr 1.9 (baseline around 1.3). BUN 46. IV fluids today Malnutrition- protein calorie, moderate to severe. On Megace. Albumin persistently low.  Acid reflux- continue otc omeprazole 20 mg daily with pepcid 20 mg or tums for breakthrough symptoms. Will give pepcid 20 mg IV in clinic today.  Hyponatremia- secondary to poor oral intake. Na 130 today. IV fluids as above.  Uncontrolled/brittle diabetes- blood sugar 117 today.  Anemia due to CKD & Chemotherapy- hemoglobin  improved to 10.3.  Monitor.  Constipation- will get abdominal xray to evaluate. Start miralax 3 times a day plus 2 senna 1-3 times a day as needed to achieve bowel movement. If symptoms not improved, add mag citrate. If symptoms don't improve, notify clinic.   Disposition: Proceed with IV fluids today. IV pepcid today. RTC in 1 week for labs (cbc, cmp), NP, +/- IV fluids  All questions were answered. The patient knows to call the clinic with any problems questions or concerns.  Beckey Rutter, DNP, AGNP-C [place] [facility] [phone] (clinic) 08/15/2021

## 2021-08-15 NOTE — Chronic Care Management (AMB) (Signed)
Chronic Care Management CCM Pharmacy Note  08/15/2021 Name:  Tiffany Bailey MRN:  [id number] DOB:  11-01-1927  Summary: - Tolerating current regimen. Glucose readings relatively well controlled  Recommendations/Changes made from today's visit: - Recommended to continue current regimen at this time  Subjective: Tiffany Bailey is an 88 y.o. year old female who is a primary patient of [name], FNP.  The CCM team was consulted for assistance with disease management and care coordination needs.    Engaged with patient's daughter, [name], by telephone for follow up visit for pharmacy case management and/or care coordination services.   Objective:  Medications Reviewed Today     Reviewed by Maia Breslow, CMA (Certified Medical Assistant) on 08/09/21 at 41  Med List Status: <None>   Medication Order Taking? Sig Documenting Provider Last Dose Status Informant  abemaciclib (VERZENIO) 50 MG tablet [id number] No Take 1 tablet (50 mg total) by mouth 2 (two) times daily.  Patient not taking: Reported on 08/09/2021   [name], MD Not Taking Active   amLODipine (NORVASC) 10 MG tablet [id number] Yes Take 1 tablet (10 mg total) by mouth daily. [name], FNP Taking Active   aspirin EC 81 MG tablet [id number] Yes Take 1 tablet (81 mg total) by mouth daily. [name], DO Taking Active Self  atorvastatin (LIPITOR) 40 MG tablet [id number] Yes TAKE 1 TABLET BY MOUTH  DAILY [name], FNP Taking Active   blood glucose meter kit and supplies KIT [id number] Yes Dispense based on patient and insurance preference. Use up to four times daily as directed. (FOR ICD-9 250.00, 250.01). [name], DO Taking Active Self  Blood Glucose Monitoring Suppl (ONETOUCH VERIO REFLECT) w/Device KIT [id number] Yes 1 Device by Does not apply route daily. [name], FNP Taking Active   cloNIDine (CATAPRES) 0.1 MG tablet [id number] Yes Take 1 tablet (0.1 mg total) by mouth 2 (two) times  daily. [name], FNP Taking Active   clopidogrel (PLAVIX) 75 MG tablet [id number] Yes TAKE 1 TABLET BY MOUTH  DAILY [name], [name], MD Taking Active Self  Continuous Blood Gluc Receiver (FREESTYLE LIBRE 2 READER) DEVI [id number] Yes Use to check blood sugar continuously [name], FNP Taking Active   Continuous Blood Gluc Sensor (FREESTYLE LIBRE 2 SENSOR) [place] [id number] Yes Apply 1 each topically every 14 (fourteen) days. [name], FNP Taking Active   feeding supplement (ENSURE ENLIVE / ENSURE PLUS) LIQD [id number] Yes Take 237 mLs by mouth 2 (two) times daily between meals. [name], MD Taking Active   glucose blood (ONE TOUCH TEST STRIPS) test strip [id number] Yes Use to test blood sugar twice daily [name], FNP Taking Active   hydrALAZINE (APRESOLINE) 10 MG tablet [id number] Yes Take 1 tablet (10 mg total) by mouth in the morning and at bedtime. Kathlen Mody, [name], PA-C Taking Active Self  HYDROcodone-acetaminophen (NORCO) 5-325 MG tablet [id number] Yes Take 1 tablet by mouth every 8 (eight) hours as needed for moderate pain. Borders, [name], NP Taking Active   Insulin Glargine (TOUJEO SOLOSTAR [place]) [id number] Yes Inject 4 Units into the skin daily. [provider] Taking Active   Insulin Pen Needle (BD PEN NEEDLE NANO U/F) 32G X 4 MM MISC [id number] Yes USE 1 DAILY [name], FNP Taking Active   Lancets [place] ULTRASOFT) lancets [id number] Yes 1 each by Other route 2 (two) times daily. 90 day supply for [employer].  Dx  E11.9 [name], FNP Taking Active   loperamide (IMODIUM) 2 MG capsule [id number] Yes Take 1 capsule (2 mg total) by mouth See admin instructions. Initial: 4 mg, followed by 2 mg after each loose stool; maximum: 16 mg/day [name], MD Taking Active   megestrol (MEGACE ES) 625 MG/5ML suspension [id number] Yes Take 5 mLs (625 mg total) by mouth daily. Borders, [name], NP Taking Active   Metoprolol Tartrate 37.5  MG TABS [id number] Yes Take 1 tablet by mouth 2 (two) times daily. [provider] Taking Active Self  nitroGLYCERIN (NITROSTAT) 0.4 MG SL tablet [id number] Yes Place 1 tablet (0.4 mg total) under the tongue every 5 (five) minutes as needed for chest pain. [name], [name], PA-C Taking Active   ondansetron (ZOFRAN) 4 MG tablet [id number] Yes Take 1 tablet (4 mg total) by mouth every 6 (six) hours as needed for nausea or vomiting. [name], MD Taking Active   valsartan (DIOVAN) 160 MG tablet [id number] Yes Take 1 tablet (160 mg total) by mouth daily. [name], [name], PA-C Taking Active   vitamin C (ASCORBIC ACID) 250 MG tablet [id number] Yes Take 250 mg by mouth daily. [provider] Taking Active Self  Med List Note Darl Pikes, RPH-CPP 03/19/21 1411): Verzenio filled at [facility]            Pertinent Labs:  Lab Results  Component Value Date   HGBA1C 8.6 (A) 02/13/2021   Lab Results  Component Value Date   CHOL 136 02/26/2019   HDL 46.50 02/26/2019   LDLCALC 76 02/26/2019   TRIG 63.0 02/26/2019   CHOLHDL 3 02/26/2019   Lab Results  Component Value Date   CREATININE 1.74 (H) 08/09/2021   BUN 33 (H) 08/09/2021   NA 130 (L) 08/09/2021   K 4.7 08/09/2021   CL 104 08/09/2021   CO2 19 (L) 08/09/2021    SDOH:  (Social Determinants of Health) assessments and interventions performed:  SDOH Interventions    Flowsheet Row Most Recent Value  SDOH Interventions   Financial Strain Interventions Intervention Not Indicated       CCM Care Plan  Review of patient past medical history, allergies, medications, health status, including review of consultants reports, laboratory and other test data, was performed as part of comprehensive evaluation and provision of chronic care management services.   Care Plan : Medication Management  Updates made by De Hollingshead, RPH-CPP since 08/15/2021 12:00 AM     Problem: Diabetes, HTN, CAD       Long-Range Goal: Disease Progression Prevention   Start Date: 02/22/2021  Recent Progress: On track  Priority: High  Note:   Current Barriers:  Unable to achieve control of diabetes   Pharmacist Clinical Goal(s):  Over the next 90 days, patient will achieve control of diabetes as evidenced by A1c  through collaboration with PharmD and provider.   Interventions: 1:1 collaboration with [name], FNP regarding development and update of comprehensive plan of care as evidenced by provider attestation and co-signature Inter-disciplinary care team collaboration (see longitudinal plan of care) Comprehensive medication review performed; medication list updated in electronic medical record  Diabetes: Uncontrolled; current treatment: Toujeo 4 units QPM  Hx Jardiance - off due to dehydration previously  Would avoid metformin due to fluctuating renal dysfunction, avoid GLP1 given concurrent GI upset Meal patterns - reports regular soda in the afternoons. Has Glucerna, Ensure  Current glucose readings: using Libre 2 CGM Date of Download: last 7  days Average Glucose: 183 mg/dL - midnight - 6 am: 201 - 6 am - noon: 164 - noon- 6 pm: 175 - 6 pm - midnight: 172 Time in Goal:  - Time in range 70-180: 52% - Time above range: 48% - Time below range: 0% No hypoglycemia. Recommended to continue current regimen at this time.   Hypertension, CAD in the setting of CKD: Uncontrolled; current treatment: amlodipine 10 mg dailyhydralazine 10 mg BID, clonidine 0.1 mg BID, metoprolol tartrate 37.5 mg BID, valsartan 160 mg  Hx lisinopril, was previously stopped due to renal function Previously recommended to continue current regimen at this time  Hyperlipidemia and CAD: Appropriately managed; current treatment: atorvastatin 40 mg daily Antiplatelet regimen: aspirin 81 mg daily, clopidogrel 75 mg daily Previously recommended to continue current regimen at this time along with collaboration with  cardiology  Metastatic Breast Cancer: Managed by hem/onc; current regimen: Verzenio on hold due to GI upset, nausea. Iron deficiency anemia: ferrous gluconate 324 mg BID - though patient taking daily  Patient Goals/Self-Care Activities Over the next 90 days, patient will:  - take medications as prescribed focus on medication adherence by using pill box check glucose daily to twice daily, document, and provide at future appointments check blood pressure daily, document, and provide at future appointments      Plan: Telephone follow up appointment with care management team member scheduled for:  6 weeks  [name], PharmD, [place], [place] Pharmacist [employer] at [employer] [phone]

## 2021-08-15 NOTE — Telephone Encounter (Signed)
430 pm.  Message received to call Luellen Pucker regarding patient.  Return call made and no answer. Unable to leave a message. ?

## 2021-08-15 NOTE — Patient Instructions (Signed)
Visit Information ? ?Following are the goals we discussed today:  ?Patient Goals/Self-Care Activities ?Over the next 90 days, patient will:  ?- take medications as prescribed ?focus on medication adherence by using pill box ?check glucose daily to twice daily, document, and provide at future appointments ?check blood pressure daily, document, and provide at future appointments ?   ?  ?  ?Plan: Telephone follow up appointment with care management team member scheduled for:  6 weeks ?  ?[name], PharmD, BCACP, CPP ?Clinical Pharmacist ?Therapist, music at [facility] [employer] [facility] ?[phone] ?  ?  ? ? ?Please call the care guide team at [phone] if you need to cancel or reschedule your appointment.  ? ?Patient verbalizes understanding of instructions and care plan provided today and agrees to view in [place]. Active MyChart status confirmed with patient.   ? ?

## 2021-08-16 ENCOUNTER — Inpatient Hospital Stay (HOSPITAL_BASED_OUTPATIENT_CLINIC_OR_DEPARTMENT_OTHER): Payer: Medicare Other | Admitting: Nurse Practitioner

## 2021-08-16 ENCOUNTER — Telehealth: Payer: Self-pay | Admitting: Primary Care

## 2021-08-16 ENCOUNTER — Inpatient Hospital Stay: Payer: Medicare Other | Attending: Oncology

## 2021-08-16 ENCOUNTER — Encounter: Payer: Self-pay | Admitting: Nurse Practitioner

## 2021-08-16 ENCOUNTER — Other Ambulatory Visit: Payer: Self-pay

## 2021-08-16 ENCOUNTER — Inpatient Hospital Stay: Payer: Medicare Other

## 2021-08-16 ENCOUNTER — Telehealth: Payer: Self-pay | Admitting: *Deleted

## 2021-08-16 VITALS — BP 134/63 | HR 56 | Temp 97.4°F | Resp 14 | Wt 94.5 lb

## 2021-08-16 DIAGNOSIS — Z9013 Acquired absence of bilateral breasts and nipples: Secondary | ICD-10-CM | POA: Insufficient documentation

## 2021-08-16 DIAGNOSIS — K219 Gastro-esophageal reflux disease without esophagitis: Secondary | ICD-10-CM | POA: Diagnosis not present

## 2021-08-16 DIAGNOSIS — K5900 Constipation, unspecified: Secondary | ICD-10-CM

## 2021-08-16 DIAGNOSIS — E860 Dehydration: Secondary | ICD-10-CM

## 2021-08-16 DIAGNOSIS — D6481 Anemia due to antineoplastic chemotherapy: Secondary | ICD-10-CM | POA: Diagnosis not present

## 2021-08-16 DIAGNOSIS — Z803 Family history of malignant neoplasm of breast: Secondary | ICD-10-CM | POA: Diagnosis not present

## 2021-08-16 DIAGNOSIS — T451X5D Adverse effect of antineoplastic and immunosuppressive drugs, subsequent encounter: Secondary | ICD-10-CM | POA: Diagnosis not present

## 2021-08-16 DIAGNOSIS — M8580 Other specified disorders of bone density and structure, unspecified site: Secondary | ICD-10-CM | POA: Insufficient documentation

## 2021-08-16 DIAGNOSIS — C50919 Malignant neoplasm of unspecified site of unspecified female breast: Secondary | ICD-10-CM | POA: Insufficient documentation

## 2021-08-16 DIAGNOSIS — E46 Unspecified protein-calorie malnutrition: Secondary | ICD-10-CM | POA: Insufficient documentation

## 2021-08-16 DIAGNOSIS — J90 Pleural effusion, not elsewhere classified: Secondary | ICD-10-CM | POA: Diagnosis not present

## 2021-08-16 DIAGNOSIS — E1122 Type 2 diabetes mellitus with diabetic chronic kidney disease: Secondary | ICD-10-CM | POA: Insufficient documentation

## 2021-08-16 DIAGNOSIS — I129 Hypertensive chronic kidney disease with stage 1 through stage 4 chronic kidney disease, or unspecified chronic kidney disease: Secondary | ICD-10-CM | POA: Diagnosis not present

## 2021-08-16 DIAGNOSIS — N179 Acute kidney failure, unspecified: Secondary | ICD-10-CM

## 2021-08-16 DIAGNOSIS — E871 Hypo-osmolality and hyponatremia: Secondary | ICD-10-CM | POA: Diagnosis not present

## 2021-08-16 DIAGNOSIS — D571 Sickle-cell disease without crisis: Secondary | ICD-10-CM | POA: Diagnosis not present

## 2021-08-16 DIAGNOSIS — Z89612 Acquired absence of left leg above knee: Secondary | ICD-10-CM | POA: Diagnosis not present

## 2021-08-16 DIAGNOSIS — N1831 Chronic kidney disease, stage 3a: Secondary | ICD-10-CM | POA: Insufficient documentation

## 2021-08-16 DIAGNOSIS — I252 Old myocardial infarction: Secondary | ICD-10-CM | POA: Insufficient documentation

## 2021-08-16 DIAGNOSIS — D631 Anemia in chronic kidney disease: Secondary | ICD-10-CM | POA: Diagnosis not present

## 2021-08-16 LAB — CBC WITH DIFFERENTIAL/PLATELET
Abs Immature Granulocytes: 0.2 10*3/uL — ABNORMAL HIGH (ref 0.00–0.07)
Basophils Absolute: 0 10*3/uL (ref 0.0–0.1)
Basophils Relative: 0 %
Eosinophils Absolute: 0 10*3/uL (ref 0.0–0.5)
Eosinophils Relative: 1 %
HCT: 30 % — ABNORMAL LOW (ref 36.0–46.0)
Hemoglobin: 10.3 g/dL — ABNORMAL LOW (ref 12.0–15.0)
Immature Granulocytes: 3 %
Lymphocytes Relative: 21 %
Lymphs Abs: 1.7 10*3/uL (ref 0.7–4.0)
MCH: 32.6 pg (ref 26.0–34.0)
MCHC: 34.3 g/dL (ref 30.0–36.0)
MCV: 94.9 fL (ref 80.0–100.0)
Monocytes Absolute: 0.8 10*3/uL (ref 0.1–1.0)
Monocytes Relative: 10 %
Neutro Abs: 5.2 10*3/uL (ref 1.7–7.7)
Neutrophils Relative %: 65 %
Platelets: 348 10*3/uL (ref 150–400)
RBC: 3.16 MIL/uL — ABNORMAL LOW (ref 3.87–5.11)
RDW: 13.6 % (ref 11.5–15.5)
WBC: 8 10*3/uL (ref 4.0–10.5)
nRBC: 0 % (ref 0.0–0.2)

## 2021-08-16 LAB — COMPREHENSIVE METABOLIC PANEL
ALT: 13 U/L (ref 0–44)
AST: 23 U/L (ref 15–41)
Albumin: 3.4 g/dL — ABNORMAL LOW (ref 3.5–5.0)
Alkaline Phosphatase: 57 U/L (ref 38–126)
Anion gap: 8 (ref 5–15)
BUN: 46 mg/dL — ABNORMAL HIGH (ref 8–23)
CO2: 19 mmol/L — ABNORMAL LOW (ref 22–32)
Calcium: 8.8 mg/dL — ABNORMAL LOW (ref 8.9–10.3)
Chloride: 103 mmol/L (ref 98–111)
Creatinine, Ser: 1.9 mg/dL — ABNORMAL HIGH (ref 0.44–1.00)
GFR, Estimated: 25 mL/min — ABNORMAL LOW (ref >60)
Glucose, Bld: 117 mg/dL — ABNORMAL HIGH (ref 70–99)
Potassium: 4.2 mmol/L (ref 3.5–5.1)
Sodium: 130 mmol/L — ABNORMAL LOW (ref 135–145)
Total Bilirubin: 0.4 mg/dL (ref 0.3–1.2)
Total Protein: 6.1 g/dL — ABNORMAL LOW (ref 6.5–8.1)

## 2021-08-16 LAB — MAGNESIUM: Magnesium: 2 mg/dL (ref 1.7–2.4)

## 2021-08-16 MED ORDER — SODIUM CHLORIDE 0.9 % IV SOLN
INTRAVENOUS | Status: DC
  Filled 2021-08-16 (×2): qty 250

## 2021-08-16 MED ORDER — FAMOTIDINE IN NACL 20-0.9 MG/50ML-% IV SOLN
20.0000 mg | Freq: Once | INTRAVENOUS | Status: AC
  Administered 2021-08-16: 20 mg via INTRAVENOUS
  Filled 2021-08-16: qty 50

## 2021-08-16 NOTE — Progress Notes (Signed)
IVFs over 2 hours. Pepcid IV given for acid reflux. Discharged to home. Accompanied by Daughter. ?

## 2021-08-16 NOTE — Telephone Encounter (Signed)
RN called and reminded daughter to take patient for the xray the NP Beckey Rutter had ordered.  Daughter verbalized understanding and stated she was going to take patient tomorrow to the [place] location.   ?

## 2021-08-16 NOTE — Patient Instructions (Signed)
It is common for patients who are undergoing treatment and taking certain prescribed medications to experience side-effects with constipation.  If you experience constipation, please take stool softeners such as Senna and/or Miralax every day to avoid constipation.  These medications are available over the counter.  Of course, if you have diarrhea, stop taking stool softeners.  Drinking plenty of fluid, eating fruits and vegetable, and being active also reduces the risk of constipation.   If despite taking stool softeners, and you still have no bowel movement for 2 days or more than your normal bowel habit frequency, please take one of the following over the counter laxatives:  Milk of Magnesia or Mag Citrate everyday and contact me immediately for further instructions.  The goal is to have at least one bowel movement every day or every other day without pain or straining.   

## 2021-08-16 NOTE — Telephone Encounter (Signed)
Received a call from Laurence Aly who oversees personal care service. Patient has a 1 cm round pressure injury stage two on her left buttock. She has been asking her caregiver and her daughter not to report it. I have ordered zinc oxide to the area for the time being (OTC). Also memory foam or some sort of cushion would be useful to offset pressure as she sits great hours every day. Also encouraged to increase her protein intake as per our last visit with the higher protein supplemental drinks. Patient has also  stated that the ulcer was resolving. If it does not respond to zinc and offloading pressure I will send our nurse to check on it and will give wound orders at that time. ? ?

## 2021-08-17 ENCOUNTER — Ambulatory Visit
Admission: RE | Admit: 2021-08-17 | Discharge: 2021-08-17 | Disposition: A | Discharge status: Home / Self Care | Payer: Medicare Other | Source: Ambulatory Visit | Attending: Nurse Practitioner | Admitting: Nurse Practitioner

## 2021-08-17 DIAGNOSIS — K5900 Constipation, unspecified: Secondary | ICD-10-CM

## 2021-08-18 DIAGNOSIS — K862 Cyst of pancreas: Secondary | ICD-10-CM | POA: Diagnosis not present

## 2021-08-18 DIAGNOSIS — R109 Unspecified abdominal pain: Secondary | ICD-10-CM | POA: Diagnosis not present

## 2021-08-18 DIAGNOSIS — Z7902 Long term (current) use of antithrombotics/antiplatelets: Secondary | ICD-10-CM | POA: Diagnosis not present

## 2021-08-18 DIAGNOSIS — K219 Gastro-esophageal reflux disease without esophagitis: Secondary | ICD-10-CM | POA: Diagnosis not present

## 2021-08-18 DIAGNOSIS — R1012 Left upper quadrant pain: Secondary | ICD-10-CM | POA: Diagnosis not present

## 2021-08-18 DIAGNOSIS — I129 Hypertensive chronic kidney disease with stage 1 through stage 4 chronic kidney disease, or unspecified chronic kidney disease: Secondary | ICD-10-CM | POA: Diagnosis not present

## 2021-08-18 DIAGNOSIS — Z7984 Long term (current) use of oral hypoglycemic drugs: Secondary | ICD-10-CM | POA: Diagnosis not present

## 2021-08-18 DIAGNOSIS — I2510 Atherosclerotic heart disease of native coronary artery without angina pectoris: Secondary | ICD-10-CM | POA: Diagnosis not present

## 2021-08-18 DIAGNOSIS — Z7982 Long term (current) use of aspirin: Secondary | ICD-10-CM | POA: Diagnosis not present

## 2021-08-18 DIAGNOSIS — E1151 Type 2 diabetes mellitus with diabetic peripheral angiopathy without gangrene: Secondary | ICD-10-CM | POA: Diagnosis not present

## 2021-08-18 DIAGNOSIS — R1013 Epigastric pain: Secondary | ICD-10-CM | POA: Diagnosis not present

## 2021-08-18 DIAGNOSIS — E785 Hyperlipidemia, unspecified: Secondary | ICD-10-CM | POA: Diagnosis not present

## 2021-08-18 DIAGNOSIS — R001 Bradycardia, unspecified: Secondary | ICD-10-CM | POA: Diagnosis not present

## 2021-08-18 DIAGNOSIS — Z79899 Other long term (current) drug therapy: Secondary | ICD-10-CM | POA: Diagnosis not present

## 2021-08-18 DIAGNOSIS — N183 Chronic kidney disease, stage 3 unspecified: Secondary | ICD-10-CM | POA: Diagnosis not present

## 2021-08-18 DIAGNOSIS — E1122 Type 2 diabetes mellitus with diabetic chronic kidney disease: Secondary | ICD-10-CM | POA: Diagnosis not present

## 2021-08-18 DIAGNOSIS — Z794 Long term (current) use of insulin: Secondary | ICD-10-CM | POA: Diagnosis not present

## 2021-08-18 DIAGNOSIS — E875 Hyperkalemia: Secondary | ICD-10-CM | POA: Diagnosis not present

## 2021-08-18 DIAGNOSIS — K5900 Constipation, unspecified: Secondary | ICD-10-CM | POA: Diagnosis not present

## 2021-08-18 DIAGNOSIS — R197 Diarrhea, unspecified: Secondary | ICD-10-CM | POA: Diagnosis not present

## 2021-08-19 DIAGNOSIS — R109 Unspecified abdominal pain: Secondary | ICD-10-CM | POA: Diagnosis not present

## 2021-08-21 ENCOUNTER — Telehealth: Payer: Self-pay | Admitting: *Deleted

## 2021-08-21 ENCOUNTER — Ambulatory Visit: Payer: Self-pay | Admitting: Pharmacist

## 2021-08-21 NOTE — Telephone Encounter (Signed)
RN called and spoke with caregiver, who stated pt had not had a bowel movement since being seen in the clinic and had to go to the ED on Saturday and was told there was nothing that could be done since the "impacted area was so far up".  RN shared above with NP Beckey Rutter who instructed for caregiver to have pt drink milk of magnesia or magnesium citrate. Caregiver verbalized understanding.  Instructed caregiver to call if she had any questions or concerns.  ?

## 2021-08-21 NOTE — Chronic Care Management (AMB) (Signed)
?  Chronic Care Management  ? ?Note ? ?08/21/2021 ?Name: Tiffany Bailey MRN: [id number] DOB: Oct 18, 1927 ? ? ? ?Closing pharmacy CCM case at this time. Will collaborate with Care Guide to outreach to schedule follow up with RN CM. Patient has clinic contact information for future questions or concerns.  ? ?[name], PharmD, [place], CPP ?Clinical Pharmacist ?Therapist, music at [employer] ?[phone] ? ?

## 2021-08-23 ENCOUNTER — Telehealth: Payer: Self-pay | Admitting: *Deleted

## 2021-08-23 ENCOUNTER — Inpatient Hospital Stay: Payer: Medicare Other

## 2021-08-23 ENCOUNTER — Encounter: Payer: Self-pay | Admitting: Nurse Practitioner

## 2021-08-23 ENCOUNTER — Emergency Department: Payer: Medicare Other

## 2021-08-23 ENCOUNTER — Inpatient Hospital Stay (HOSPITAL_BASED_OUTPATIENT_CLINIC_OR_DEPARTMENT_OTHER): Payer: Medicare Other | Admitting: Nurse Practitioner

## 2021-08-23 ENCOUNTER — Encounter: Payer: Self-pay | Admitting: Emergency Medicine

## 2021-08-23 ENCOUNTER — Inpatient Hospital Stay: Payer: Medicare Other | Admitting: Hospice and Palliative Medicine

## 2021-08-23 ENCOUNTER — Observation Stay
Admission: EM | Admit: 2021-08-23 | Discharge: 2021-08-24 | Disposition: A | Discharge status: Home Health | Payer: Medicare Other | Attending: Internal Medicine | Admitting: Internal Medicine

## 2021-08-23 ENCOUNTER — Other Ambulatory Visit: Payer: Self-pay

## 2021-08-23 VITALS — BP 157/68 | HR 59 | Temp 96.5°F | Resp 16 | Wt 95.0 lb

## 2021-08-23 DIAGNOSIS — F32A Depression, unspecified: Secondary | ICD-10-CM | POA: Diagnosis present

## 2021-08-23 DIAGNOSIS — N281 Cyst of kidney, acquired: Secondary | ICD-10-CM | POA: Diagnosis not present

## 2021-08-23 DIAGNOSIS — F419 Anxiety disorder, unspecified: Secondary | ICD-10-CM | POA: Diagnosis present

## 2021-08-23 DIAGNOSIS — N1832 Chronic kidney disease, stage 3b: Secondary | ICD-10-CM | POA: Diagnosis not present

## 2021-08-23 DIAGNOSIS — E1140 Type 2 diabetes mellitus with diabetic neuropathy, unspecified: Secondary | ICD-10-CM | POA: Diagnosis present

## 2021-08-23 DIAGNOSIS — K5901 Slow transit constipation: Secondary | ICD-10-CM | POA: Insufficient documentation

## 2021-08-23 DIAGNOSIS — I129 Hypertensive chronic kidney disease with stage 1 through stage 4 chronic kidney disease, or unspecified chronic kidney disease: Secondary | ICD-10-CM | POA: Insufficient documentation

## 2021-08-23 DIAGNOSIS — Z20822 Contact with and (suspected) exposure to covid-19: Secondary | ICD-10-CM | POA: Insufficient documentation

## 2021-08-23 DIAGNOSIS — M8580 Other specified disorders of bone density and structure, unspecified site: Secondary | ICD-10-CM | POA: Diagnosis not present

## 2021-08-23 DIAGNOSIS — E1122 Type 2 diabetes mellitus with diabetic chronic kidney disease: Secondary | ICD-10-CM | POA: Insufficient documentation

## 2021-08-23 DIAGNOSIS — Z951 Presence of aortocoronary bypass graft: Secondary | ICD-10-CM | POA: Diagnosis not present

## 2021-08-23 DIAGNOSIS — Z7902 Long term (current) use of antithrombotics/antiplatelets: Secondary | ICD-10-CM | POA: Insufficient documentation

## 2021-08-23 DIAGNOSIS — I2510 Atherosclerotic heart disease of native coronary artery without angina pectoris: Secondary | ICD-10-CM | POA: Diagnosis not present

## 2021-08-23 DIAGNOSIS — I739 Peripheral vascular disease, unspecified: Secondary | ICD-10-CM | POA: Diagnosis present

## 2021-08-23 DIAGNOSIS — L89152 Pressure ulcer of sacral region, stage 2: Secondary | ICD-10-CM | POA: Diagnosis present

## 2021-08-23 DIAGNOSIS — Z794 Long term (current) use of insulin: Secondary | ICD-10-CM | POA: Diagnosis not present

## 2021-08-23 DIAGNOSIS — Z955 Presence of coronary angioplasty implant and graft: Secondary | ICD-10-CM | POA: Diagnosis not present

## 2021-08-23 DIAGNOSIS — Z139 Encounter for screening, unspecified: Secondary | ICD-10-CM | POA: Diagnosis not present

## 2021-08-23 DIAGNOSIS — R339 Retention of urine, unspecified: Principal | ICD-10-CM | POA: Diagnosis present

## 2021-08-23 DIAGNOSIS — D6481 Anemia due to antineoplastic chemotherapy: Secondary | ICD-10-CM | POA: Diagnosis not present

## 2021-08-23 DIAGNOSIS — Z79899 Other long term (current) drug therapy: Secondary | ICD-10-CM | POA: Insufficient documentation

## 2021-08-23 DIAGNOSIS — Z7982 Long term (current) use of aspirin: Secondary | ICD-10-CM | POA: Diagnosis not present

## 2021-08-23 DIAGNOSIS — K5641 Fecal impaction: Secondary | ICD-10-CM | POA: Diagnosis not present

## 2021-08-23 DIAGNOSIS — J90 Pleural effusion, not elsewhere classified: Secondary | ICD-10-CM | POA: Diagnosis not present

## 2021-08-23 DIAGNOSIS — K219 Gastro-esophageal reflux disease without esophagitis: Secondary | ICD-10-CM | POA: Diagnosis not present

## 2021-08-23 DIAGNOSIS — E46 Unspecified protein-calorie malnutrition: Secondary | ICD-10-CM | POA: Diagnosis not present

## 2021-08-23 DIAGNOSIS — D631 Anemia in chronic kidney disease: Secondary | ICD-10-CM | POA: Diagnosis not present

## 2021-08-23 DIAGNOSIS — C50919 Malignant neoplasm of unspecified site of unspecified female breast: Secondary | ICD-10-CM | POA: Diagnosis present

## 2021-08-23 DIAGNOSIS — N1831 Chronic kidney disease, stage 3a: Secondary | ICD-10-CM | POA: Diagnosis present

## 2021-08-23 DIAGNOSIS — K5900 Constipation, unspecified: Secondary | ICD-10-CM | POA: Diagnosis not present

## 2021-08-23 DIAGNOSIS — E860 Dehydration: Secondary | ICD-10-CM | POA: Insufficient documentation

## 2021-08-23 DIAGNOSIS — R1084 Generalized abdominal pain: Secondary | ICD-10-CM | POA: Diagnosis not present

## 2021-08-23 DIAGNOSIS — E871 Hypo-osmolality and hyponatremia: Secondary | ICD-10-CM | POA: Diagnosis not present

## 2021-08-23 DIAGNOSIS — Z89612 Acquired absence of left leg above knee: Secondary | ICD-10-CM | POA: Diagnosis not present

## 2021-08-23 DIAGNOSIS — Z9013 Acquired absence of bilateral breasts and nipples: Secondary | ICD-10-CM | POA: Diagnosis not present

## 2021-08-23 DIAGNOSIS — I252 Old myocardial infarction: Secondary | ICD-10-CM | POA: Diagnosis not present

## 2021-08-23 DIAGNOSIS — I10 Essential (primary) hypertension: Secondary | ICD-10-CM | POA: Diagnosis present

## 2021-08-23 DIAGNOSIS — T451X5D Adverse effect of antineoplastic and immunosuppressive drugs, subsequent encounter: Secondary | ICD-10-CM | POA: Diagnosis not present

## 2021-08-23 DIAGNOSIS — Z803 Family history of malignant neoplasm of breast: Secondary | ICD-10-CM | POA: Diagnosis not present

## 2021-08-23 DIAGNOSIS — C7981 Secondary malignant neoplasm of breast: Secondary | ICD-10-CM | POA: Insufficient documentation

## 2021-08-23 HISTORY — DX: Fecal impaction: K56.41

## 2021-08-23 LAB — LACTIC ACID, PLASMA: Lactic Acid, Venous: 1.7 mmol/L (ref 0.5–1.9)

## 2021-08-23 LAB — CBC WITH DIFFERENTIAL/PLATELET
Abs Immature Granulocytes: 0.09 10*3/uL — ABNORMAL HIGH (ref 0.00–0.07)
Basophils Absolute: 0.1 10*3/uL (ref 0.0–0.1)
Basophils Relative: 1 %
Eosinophils Absolute: 0.1 10*3/uL (ref 0.0–0.5)
Eosinophils Relative: 1 %
HCT: 30 % — ABNORMAL LOW (ref 36.0–46.0)
Hemoglobin: 9.6 g/dL — ABNORMAL LOW (ref 12.0–15.0)
Immature Granulocytes: 1 %
Lymphocytes Relative: 12 %
Lymphs Abs: 1 10*3/uL (ref 0.7–4.0)
MCH: 32 pg (ref 26.0–34.0)
MCHC: 32 g/dL (ref 30.0–36.0)
MCV: 100 fL (ref 80.0–100.0)
Monocytes Absolute: 0.5 10*3/uL (ref 0.1–1.0)
Monocytes Relative: 7 %
Neutro Abs: 6.2 10*3/uL (ref 1.7–7.7)
Neutrophils Relative %: 78 %
Platelets: 316 10*3/uL (ref 150–400)
RBC: 3 MIL/uL — ABNORMAL LOW (ref 3.87–5.11)
RDW: 13.9 % (ref 11.5–15.5)
WBC: 8 10*3/uL (ref 4.0–10.5)
nRBC: 0 % (ref 0.0–0.2)

## 2021-08-23 LAB — COMPREHENSIVE METABOLIC PANEL
ALT: 24 U/L (ref 0–44)
AST: 63 U/L — ABNORMAL HIGH (ref 15–41)
Albumin: 3.3 g/dL — ABNORMAL LOW (ref 3.5–5.0)
Alkaline Phosphatase: 59 U/L (ref 38–126)
Anion gap: 8 (ref 5–15)
BUN: 40 mg/dL — ABNORMAL HIGH (ref 8–23)
CO2: 18 mmol/L — ABNORMAL LOW (ref 22–32)
Calcium: 8.5 mg/dL — ABNORMAL LOW (ref 8.9–10.3)
Chloride: 106 mmol/L (ref 98–111)
Creatinine, Ser: 1.73 mg/dL — ABNORMAL HIGH (ref 0.44–1.00)
GFR, Estimated: 28 mL/min — ABNORMAL LOW (ref >60)
Glucose, Bld: 183 mg/dL — ABNORMAL HIGH (ref 70–99)
Potassium: 4.7 mmol/L (ref 3.5–5.1)
Sodium: 132 mmol/L — ABNORMAL LOW (ref 135–145)
Total Bilirubin: 0.8 mg/dL (ref 0.3–1.2)
Total Protein: 6.1 g/dL — ABNORMAL LOW (ref 6.5–8.1)

## 2021-08-23 LAB — HEMOGLOBIN AND HEMATOCRIT, BLOOD
HCT: 32 % — ABNORMAL LOW (ref 36.0–46.0)
Hemoglobin: 10.7 g/dL — ABNORMAL LOW (ref 12.0–15.0)

## 2021-08-23 LAB — GLUCOSE, CAPILLARY: Glucose-Capillary: 168 mg/dL — ABNORMAL HIGH (ref 70–99)

## 2021-08-23 LAB — MAGNESIUM: Magnesium: 2.2 mg/dL (ref 1.7–2.4)

## 2021-08-23 MED ORDER — ACETAMINOPHEN 325 MG PO TABS
650.0000 mg | ORAL_TABLET | Freq: Once | ORAL | Status: AC
  Administered 2021-08-23: 21:00:00 650 mg via ORAL
  Filled 2021-08-23: qty 2

## 2021-08-23 MED ORDER — ONDANSETRON HCL 4 MG PO TABS: 4.0000 mg | ORAL_TABLET | Freq: Four times a day (QID) | ORAL | Status: DC | PRN

## 2021-08-23 MED ORDER — LACTULOSE 10 GM/15ML PO SOLN
30.0000 g | ORAL | Status: AC
  Administered 2021-08-23 (×2): 30 g via ORAL
  Filled 2021-08-23 (×2): qty 60

## 2021-08-23 MED ORDER — POLYETHYLENE GLYCOL 3350 17 G PO PACK
17.0000 g | PACK | Freq: Every day | ORAL | Status: DC
  Administered 2021-08-23 – 2021-08-24 (×2): 17 g via ORAL
  Filled 2021-08-23 (×2): qty 1

## 2021-08-23 MED ORDER — CLOPIDOGREL BISULFATE 75 MG PO TABS
75.0000 mg | ORAL_TABLET | Freq: Every day | ORAL | Status: DC
  Administered 2021-08-24: 75 mg via ORAL
  Filled 2021-08-23: qty 1

## 2021-08-23 MED ORDER — MEGESTROL ACETATE 625 MG/5ML PO SUSP: 625.0000 mg | Freq: Every day | ORAL | Status: DC

## 2021-08-23 MED ORDER — MEGESTROL ACETATE 400 MG/10ML PO SUSP
625.0000 mg | Freq: Every day | ORAL | Status: DC
  Administered 2021-08-24: 625 mg via ORAL
  Filled 2021-08-23: qty 20

## 2021-08-23 MED ORDER — ASPIRIN EC 81 MG PO TBEC
81.0000 mg | DELAYED_RELEASE_TABLET | Freq: Every day | ORAL | Status: DC
  Administered 2021-08-23 – 2021-08-24 (×2): 81 mg via ORAL
  Filled 2021-08-23 (×2): qty 1

## 2021-08-23 MED ORDER — CLONIDINE HCL 0.1 MG PO TABS
0.1000 mg | ORAL_TABLET | Freq: Two times a day (BID) | ORAL | Status: DC
  Administered 2021-08-23 – 2021-08-24 (×2): 0.1 mg via ORAL
  Filled 2021-08-23 (×2): qty 1

## 2021-08-23 MED ORDER — INSULIN GLARGINE (1 UNIT DIAL) 300 UNIT/ML ~~LOC~~ SOPN: 4.0000 [IU] | PEN_INJECTOR | Freq: Every day | SUBCUTANEOUS | Status: DC

## 2021-08-23 MED ORDER — INSULIN ASPART 100 UNIT/ML IJ SOLN: 0.0000 [IU] | Freq: Three times a day (TID) | INTRAMUSCULAR | Status: DC

## 2021-08-23 MED ORDER — SORBITOL 70 % SOLN
960.0000 mL | TOPICAL_OIL | Freq: Once | ORAL | Status: AC
  Administered 2021-08-23: 960 mL via RECTAL
  Filled 2021-08-23: qty 240

## 2021-08-23 MED ORDER — HYDRALAZINE HCL 10 MG PO TABS
10.0000 mg | ORAL_TABLET | Freq: Three times a day (TID) | ORAL | Status: DC
  Administered 2021-08-23 – 2021-08-24 (×3): 10 mg via ORAL
  Filled 2021-08-23 (×5): qty 1

## 2021-08-23 MED ORDER — LACTULOSE 10 GM/15ML PO SOLN
30.0000 g | Freq: Three times a day (TID) | ORAL | Status: DC
Start: 2021-08-23 — End: 2021-08-23
  Administered 2021-08-23: 18:00:00 30 g via ORAL
  Filled 2021-08-23: qty 60

## 2021-08-23 MED ORDER — ENSURE ENLIVE PO LIQD
237.0000 mL | Freq: Two times a day (BID) | ORAL | Status: DC
  Administered 2021-08-24 (×2): 237 mL via ORAL

## 2021-08-23 MED ORDER — LACTATED RINGERS IV SOLN: INTRAVENOUS | Status: AC

## 2021-08-23 MED ORDER — SODIUM BICARBONATE 650 MG PO TABS
650.0000 mg | ORAL_TABLET | Freq: Two times a day (BID) | ORAL | Status: DC
  Administered 2021-08-23 – 2021-08-24 (×2): 650 mg via ORAL
  Filled 2021-08-23 (×3): qty 1

## 2021-08-23 MED ORDER — INSULIN GLARGINE-YFGN 100 UNIT/ML ~~LOC~~ SOLN
4.0000 [IU] | Freq: Every day | SUBCUTANEOUS | Status: DC
  Filled 2021-08-23 (×2): qty 0.04

## 2021-08-23 MED ORDER — AMLODIPINE BESYLATE 5 MG PO TABS
10.0000 mg | ORAL_TABLET | Freq: Every day | ORAL | Status: DC
  Administered 2021-08-24: 10 mg via ORAL
  Filled 2021-08-23: qty 2

## 2021-08-23 MED ORDER — IRBESARTAN 150 MG PO TABS
150.0000 mg | ORAL_TABLET | Freq: Every day | ORAL | Status: DC
  Administered 2021-08-24: 150 mg via ORAL
  Filled 2021-08-23: qty 1

## 2021-08-23 MED ORDER — HEPARIN SODIUM (PORCINE) 5000 UNIT/ML IJ SOLN
5000.0000 [IU] | Freq: Two times a day (BID) | INTRAMUSCULAR | Status: DC
  Administered 2021-08-23 – 2021-08-24 (×2): 5000 [IU] via SUBCUTANEOUS
  Filled 2021-08-23 (×2): qty 1

## 2021-08-23 MED ORDER — ATORVASTATIN CALCIUM 20 MG PO TABS
40.0000 mg | ORAL_TABLET | Freq: Every day | ORAL | Status: DC
  Administered 2021-08-23: 21:00:00 40 mg via ORAL
  Filled 2021-08-23: qty 2

## 2021-08-23 MED ORDER — METOPROLOL TARTRATE 25 MG PO TABS
37.5000 mg | ORAL_TABLET | Freq: Two times a day (BID) | ORAL | Status: DC
  Administered 2021-08-23 – 2021-08-24 (×2): 37.5 mg via ORAL
  Filled 2021-08-23 (×2): qty 2

## 2021-08-23 NOTE — ED Notes (Signed)
No labs collected at this time, due to labs being drawn at the cancer center this morning.  ?

## 2021-08-23 NOTE — Telephone Encounter (Signed)
RN had son (OJ)  # which he had given this nurse since he had to leave patient at clinic to pick up grandson.  RN left voicemail message that the NP who had seen Tiffany Bailey today wanted to be seen in the ED due to her continued issues with constipation and that the patient was being transported by staff to the ED at this time.  RN stated that a note with his name and number was given to the ED staff. ?

## 2021-08-23 NOTE — Progress Notes (Signed)
Cross Cover ?Patient reported to nurse she had not voided since morning before coming to hospital.  Bladder scan indicated greater thant 999 ml. In and out cath ordered with bladder scan now every 8 hours ?

## 2021-08-23 NOTE — Progress Notes (Signed)
Cross Cover ?Nurse reports 2 bright red bloody stools with mucous. Likely mucosal irritation from severe impaction and enemas. Stat H & H stable. Continue to monitor ?

## 2021-08-23 NOTE — Progress Notes (Signed)
Pt in for follow up, reports she still has not had a bowel movement.  Pt denies using miralax or milk of magnesium as instructed by NP and RN.  Pt reports decreased appetite and  states she drink one ensure daily. ?

## 2021-08-23 NOTE — Progress Notes (Signed)
Symptom Management [place] [facility]. [facility] [address], [place], [place] [address] [phone] (phone) [phone] (fax)  Patient Care Team: [name], FNP as PCP - General (Family Medicine) Jason Coop, NP as Nurse Practitioner (Hospice and Palliative Medicine) [name], MD as Consulting Physician (Oncology) Rockey Situ [name], MD as Consulting Physician (Cardiology) Lauretta Grill, NP as Nurse Practitioner (Nurse Practitioner) Borders, [name], NP as Nurse Practitioner [facility] and Palliative Medicine)   Name of the patient: Tiffany Bailey  [id number]  08-02-1927   Date of visit: 08/23/21  Diagnosis- Metastatic Breast cancer  Chief complaint/ Reason for visit- abdominal pain & constipation  Heme/Onc history:  Oncology History  Breast cancer ([place])  07/19/2019 Initial Diagnosis   Breast cancer ([place])   07/26/2019 - 07/26/2019 Chemotherapy   The patient had anastrozole (ARIMIDEX) 1 MG tablet, 1 mg, Oral, Daily, 0 of 1 cycle, Start date: --, End date: -- abemaciclib (VERZENIO) 150 MG tablet, 150 mg, Oral, 2 times daily, 0 of 1 cycle, Start date: --, End date: --   for chemotherapy treatment.       Interval history- Patient is 88 year old female, currently on fulvestrant for metastatic breast cancer who returns to symptom management clinic for complaints of unrelieved constipation and worsening abdominal pain. She was seen last week in clinic and reported constipation for 3+ weeks. Abdominal xray showed moderate stool burden but no blockage. It was recommended that she start miralax, senna, and magnesium citrate vs milk of magnesia. Patient took 2 doses of miralax and did not see results and discontinued medication. Did not try other medications. She previously suffered diarrhea with cancer treatments and reports concern for recurrent symptoms. She developed  worsening abdominal pain and was seen at [facility] ER. Imaging showed large stool blockage and mildly dilated bowel loops. Similar regimen was recommended but patient declined.  Today she reports 10/10 abdominal pain, generalized. Limiting oral intake d/t worsening of abdominal pain with food. Drinking 1 ensure/day. Feels weak. Lives alone. Daughter and son assist her intermittently. No nausea, vomiting. No fevers, chills. Passing no stool or diarrhea.   ECOG FS:3 - Symptomatic, >50% confined to bed  Review of systems- Review of Systems  Constitutional:  Positive for malaise/fatigue. Negative for chills, fever and weight loss.  HENT:  Negative for hearing loss, nosebleeds, sore throat and tinnitus.   Eyes:  Negative for blurred vision and double vision.  Respiratory:  Negative for cough, hemoptysis, shortness of breath and wheezing.   Cardiovascular:  Negative for chest pain, palpitations and leg swelling.  Gastrointestinal:  Positive for abdominal pain and constipation. Negative for blood in stool, diarrhea, melena, nausea and vomiting.  Genitourinary:  Negative for dysuria and urgency.  Musculoskeletal:  Negative for back pain, falls, joint pain and myalgias.  Skin:  Negative for itching and rash.  Neurological:  Positive for weakness. Negative for dizziness, tingling, sensory change, loss of consciousness and headaches.  Endo/Heme/Allergies:  Negative for environmental allergies. Does not bruise/bleed easily.  Psychiatric/Behavioral:  Negative for depression. The patient is not nervous/anxious and does not have insomnia.      Allergies  Allergen Reactions   Amoxicillin Hives and Nausea Only   Gabapentin Other (See Comments)    AMS Confusion   Mirtazapine Itching    Dry mouth with ODT Denies SOB with medication   Other     UNKNOWN PAIN MEDICATION - unknown reaction  Past Medical History:  Diagnosis Date   Anemia    Anginal pain ([place])    Arthritis    Breast cancer ([place]) 07/19/2019    CKD (chronic kidney disease), stage III ([place])    Coronary artery disease    a. 2002 CABG x 3 (LIMA-LAD, SVG-D1, SVG-RCA); b. s/p multiple PCI's.   Diabetes mellitus without complication ([place])    Diastolic dysfunction    a. 12/2017 Echo: EF 55-60%, no rwma, Gr1 DD. Triv AI. Mild-mod MR/TR. PASP 13mHg.   Edema    Failure to thrive in adult    GERD (gastroesophageal reflux disease)    Hb-SS disease without crisis ([facility] 10/23/2017   History of breast cancer    HOH (hard of hearing)    Hyperlipidemia    Hypertension    Myocardial infarction ([facility]    PAD (peripheral artery disease) ([place]    a. s/p L AKA; b. 12/2018 PTA R peroneal & PTA/Stenting R SFA.   Palpitations     Past Surgical History:  Procedure Laterality Date   ABDOMINAL HYSTERECTOMY     ABOVE KNEE LEG AMPUTATION Left 2013   ANTERIOR VITRECTOMY Left 11/16/2015   Procedure: ANTERIOR VITRECTOMY;  Surgeon: BEulogio Bear MD;  Location: [facility] ORS;  Service: Ophthalmology;  Laterality: Left;   BLADDER SURGERY     CATARACT EXTRACTION W/PHACO Left 11/16/2015   Procedure: CATARACT EXTRACTION PHACO AND INTRAOCULAR LENS PLACEMENT (IOC);  Surgeon: BEulogio Bear MD;  Location: [facility] ORS;  Service: Ophthalmology;  Laterality: Left;  Lot # [id number] [place] 01:22.8 AP%:12.6 CDE: 10.46   CATARACT EXTRACTION W/PHACO Right 08/22/2016   Procedure: CATARACT EXTRACTION PHACO AND INTRAOCULAR LENS PLACEMENT (IOC);  Surgeon: BEulogio Bear MD;  Location: [facility] ORS;  Service: Ophthalmology;  Laterality: Right;  Lot # [id number] [place] 00:52.1 AP%:8.7 CDE: 4.99    CORONARY ANGIOPLASTY     STENT   CORONARY ARTERY BYPASS GRAFT     LOWER EXTREMITY ANGIOGRAPHY Right 01/14/2019   Procedure: LOWER EXTREMITY ANGIOGRAPHY;  Surgeon: DAlgernon Huxley MD;  [facility];  Service: Cardiovascular;  Laterality: Right;   MASTECTOMY     TUMOR REMOVAL     ABDOMINAL    Social History   Socioeconomic History   Marital status: Divorced    Spouse  name: Not on file   Number of children: 7   Years of education: Not on file   Highest education level: Not on file  Occupational History   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  Tobacco Use   Smoking status: Never   Smokeless tobacco: Never  Vaping Use   Vaping Use: Never used  Substance and Sexual Activity   Alcohol use: No   Drug use: No   Sexual activity: Not Currently  Other Topics Concern   Not on file  Social History Narrative   Not on file   Social Determinants of Health   Financial Resource Strain: Low Risk    Difficulty of Paying Living Expenses: Not hard at all  Food Insecurity: Not on file  Transportation Needs: Not on file  Physical Activity: Not on file  Stress: Not on file  Social Connections: Not on file  Intimate Partner Violence: Not on file    Family History  Problem Relation Age of Onset   Sudden death Mother    Arthritis Father    Stroke Father    Hypertension Sister    Hypertension Sister    Diabetes Grandchild  Current Outpatient Medications:    amLODipine (NORVASC) 10 MG tablet, Take 1 tablet (10 mg total) by mouth daily., Disp: 90 tablet, Rfl: 1   aspirin EC 81 MG tablet, Take 1 tablet (81 mg total) by mouth daily., Disp: 30 tablet, Rfl: 5   atorvastatin (LIPITOR) 40 MG tablet, TAKE 1 TABLET BY MOUTH  DAILY, Disp: 90 tablet, Rfl: 3   blood glucose meter kit and supplies KIT, Dispense based on patient and insurance preference. Use up to four times daily as directed. (FOR ICD-9 250.00, 250.01)., Disp: 1 each, Rfl: 0   Blood Glucose Monitoring Suppl (ONETOUCH VERIO REFLECT) w/Device KIT, 1 Device by Does not apply route daily., Disp: 1 kit, Rfl: 0   cloNIDine (CATAPRES) 0.1 MG tablet, Take 1 tablet (0.1 mg total) by mouth 2 (two) times daily., Disp: 60 tablet, Rfl: 2   clopidogrel (PLAVIX) 75 MG tablet, TAKE 1 TABLET BY MOUTH  DAILY, Disp: 30 tablet, Rfl: 0   Continuous Blood Gluc Receiver (FREESTYLE LIBRE 2 READER) DEVI, Use to  check blood sugar continuously, Disp: 1 each, Rfl: 1   Continuous Blood Gluc Sensor (FREESTYLE LIBRE 2 SENSOR) MISC, Apply 1 each topically every 14 (fourteen) days., Disp: 2 each, Rfl: 11   feeding supplement (ENSURE ENLIVE / ENSURE PLUS) LIQD, Take 237 mLs by mouth 2 (two) times daily between meals., Disp: , Rfl:    glucose blood (ONE TOUCH TEST STRIPS) test strip, Use to test blood sugar twice daily, Disp: 200 each, Rfl: 3   hydrALAZINE (APRESOLINE) 10 MG tablet, Take 1 tablet (10 mg total) by mouth in the morning and at bedtime., Disp: 180 tablet, Rfl: 3   Insulin Glargine (TOUJEO SOLOSTAR [place]), Inject 4 Units into the skin daily., Disp: , Rfl:    Insulin Pen Needle (BD PEN NEEDLE NANO U/F) 32G X 4 MM MISC, USE 1 DAILY, Disp: 100 each, Rfl: 12   Lancets (ONETOUCH ULTRASOFT) lancets, 1 each by Other route 2 (two) times daily. 90 day supply for [employer].  Dx E11.9, Disp: 300 each, Rfl: 12   megestrol (MEGACE ES) 625 MG/5ML suspension, Take 5 mLs (625 mg total) by mouth daily., Disp: 150 mL, Rfl: 0   Metoprolol Tartrate 37.5 MG TABS, Take 1 tablet by mouth 2 (two) times daily., Disp: , Rfl:    nitroGLYCERIN (NITROSTAT) 0.4 MG SL tablet, Place 1 tablet (0.4 mg total) under the tongue every 5 (five) minutes as needed for chest pain., Disp: 25 tablet, Rfl: 1   ondansetron (ZOFRAN) 4 MG tablet, Take 1 tablet (4 mg total) by mouth every 6 (six) hours as needed for nausea or vomiting., Disp: 60 tablet, Rfl: 1   valsartan (DIOVAN) 160 MG tablet, Take 1 tablet (160 mg total) by mouth daily., Disp: 90 tablet, Rfl: 3   vitamin C (ASCORBIC ACID) 250 MG tablet, Take 250 mg by mouth daily., Disp: , Rfl:    abemaciclib (VERZENIO) 50 MG tablet, Take 1 tablet (50 mg total) by mouth 2 (two) times daily. (Patient not taking: Reported on 08/09/2021), Disp: 56 tablet, Rfl: 0   HYDROcodone-acetaminophen (NORCO) 5-325 MG tablet, Take 1 tablet by mouth every 8 (eight) hours as needed for moderate pain. (Patient  not taking: Reported on 08/16/2021), Disp: 30 tablet, Rfl: 0  Physical exam:  Vitals:   08/23/21 1051  BP: (!) 157/68  Pulse: (!) 59  Resp: 16  Temp: (!) 96.5 F (35.8 C)  TempSrc: Tympanic  SpO2: 100%  Weight: 95 lb (43.1  kg)   Physical Exam Vitals and nursing note reviewed.  Constitutional:      Appearance: She is ill-appearing.     Comments: Fatigued appearing. Frail. In recliner in exam room. Unaccompanied.   Cardiovascular:     Rate and Rhythm: Normal rate and regular rhythm.  Pulmonary:     Effort: Pulmonary effort is normal. No respiratory distress.  Abdominal:     General: Bowel sounds are normal. There is distension.     Palpations: There is no mass.     Tenderness: There is generalized abdominal tenderness. There is no guarding or rebound.     Comments: Bowel is more distended and tense compared to previous exam   Skin:    General: Skin is warm and dry.     Coloration: Skin is not jaundiced.  Neurological:     Mental Status: She is oriented to person, place, and time.  Psychiatric:        Mood and Affect: Mood normal.        Behavior: Behavior normal.     CMP Latest Ref Rng & Units 08/23/2021  Glucose 70 - 99 mg/dL 183(H)  BUN 8 - 23 mg/dL 40(H)  Creatinine 0.44 - 1.00 mg/dL 1.73(H)  Sodium 135 - 145 mmol/L 132(L)  Potassium 3.5 - 5.1 mmol/L 4.7  Chloride 98 - 111 mmol/L 106  CO2 22 - 32 mmol/L 18(L)  Calcium 8.9 - 10.3 mg/dL 8.5(L)  Total Protein 6.5 - 8.1 g/dL 6.1(L)  Total Bilirubin 0.3 - 1.2 mg/dL 0.8  Alkaline Phos 38 - 126 U/L 59  AST 15 - 41 U/L 63(H)  ALT 0 - 44 U/L 24   CBC Latest Ref Rng & Units 08/23/2021  WBC 4.0 - 10.5 K/uL 8.0  Hemoglobin 12.0 - 15.0 g/dL 9.6(L)  Hematocrit 36.0 - 46.0 % 30.0(L)  Platelets 150 - 400 K/uL 316    No images are attached to the encounter.  DG Abd 2 Views  Result Date: 08/17/2021 CLINICAL DATA:  Constipation for 3 weeks EXAM: ABDOMEN - 2 VIEW COMPARISON:  None. FINDINGS: Gas-filled, nondistended small bowel  and colon throughout the abdomen and pelvis, gas present to at least the descending colon. Moderate burden of stool. No free air in the abdomen. No radio-opaque calculi or other significant radiographic abnormality is seen. IMPRESSION: Gas-filled, nondistended small bowel and colon throughout the abdomen and pelvis, gas present to at least the descending colon. Moderate burden of stool. No free air in the abdomen. Electronically Signed   By: Delanna Ahmadi M.D.   On: 08/17/2021 15:52    08/18/2021- CT Abdomen Pelvis W IV Contrast- [facility]  EXAM: CT ABDOMEN PELVIS W CONTRAST  DATE: 08/18/2021 8:18 PM  ACCESSION: [id number]  DICTATED: 08/18/2021 8:28 PM  INTERPRETATION LOCATION: [place]   CLINICAL INDICATION: 88 years old with abdominal pain ; Abdominal pain, acute, nonlocalized     COMPARISON: CT abdomen pelvis 01/28/2016.   TECHNIQUE: A helical CT scan of the abdomen and pelvis was obtained following IV contrast from the lung bases through the pubic symphysis. Images were reconstructed in the axial plane. Coronal and sagittal reformatted images were also provided for further evaluation.    FINDINGS:   LOWER CHEST: Partially visualized moderate right pleural effusion.   LIVER: Normal liver contour.  No focal liver lesions.   BILIARY: The gallbladder is moderately distended. Borderline intrahepatic biliary ductal dilation, which may be related to patient's age.   SPLEEN: Normal in size and contour.   PANCREAS:  Atrophic pancreas with 0.9 cm cystic lesion in the pancreatic tail.   ADRENAL GLANDS: Normal appearance of the adrenal glands.   KIDNEYS/URETERS: Symmetric renal enhancement. Unchanged right renal cyst measuring up to 5.3 cm. Multiple bilateral renal calcifications some of which likely represent vascular calcifications and others nonobstructing renal stones. No hydronephrosis or ureteral dilation.   BLADDER: Distended bladder.   REPRODUCTIVE ORGANS: The uterus is surgically  absent.   GI TRACT: Multiple fluid distended loops of small bowel. Large volume of stool and gas within borderline dilated loops of large bowel measuring up to 6.1 cm (4:23). The colon tapers to decompressed bowel at the sigmoid colon (2:57). In this area there is nonspecific mesenteric engorgement and question mild swirling of mesenteric vasculature, although this configuration of the colon is unchanged compared to 01/28/2016. Appendix is not visualized. No secondary signs of appendicitis.   PERITONEUM, RETROPERITONEUM AND MESENTERY: No free air.  No ascites.  Small amount of loculated fluid abutting the duodenum (2:46), relatively similar compared to prior.   LYMPH NODES: No adenopathy.   VESSELS: Hepatic and portal veins are patent.  Normal caliber aorta. Severe calcified atherosclerotic disease of the abdominal aorta and branch vessels. Left femoral stent graft.   BONES and SOFT TISSUES: No aggressive osseous lesions. Age-indeterminate compression deformity of L5 vertebral body. Multilevel degenerative changes of the spine. Mild body wall edema. Mild stranding posterior to the sacrum, similar compared to prior.  Assessment and plan- Patient is a 88 y.o. female with metastatic breast cancer currently on fulvestrant who presents to Symptom Management Clinic  Constipation- x 4+ weeks. Unrelieved by home measures and iv fluids. Interval imaging at ER at [facility] on 08/18/2021 showed multiple fluid distended loops of small bowel, large stool volume, gas, and borderline dilated bowel loops measuring up to 6.1 cm. Sigmoid colon is decompressed. Clinically, increased pain. Patient has limited social support and given worsening imaging and symptoms feel she would benefit from evaluation in ER and hospital management of symptoms. Discussed with Dr. Tasia Catchings who is in agreement with plan. I spoke to ER in anticipation of transfer.  Acute abdominal pain- secondary to above constipation/possible obstruction.  Frailty-  wheelchair at baseline Malnutrition- protein calorie malnutrition. Drinking 1 ensure per day. Not currently consuming solid foods d/t worsening pain and constipation.  Opioid use- d/t cancer related pain. May be contributing to constipation. Can consider opioid receptor antagonist- relistor vs movantik.   Disposition: Discharge to ER.  Follow up as scheduled    Visit Diagnosis No diagnosis found.  Patient expressed understanding and was in agreement with this plan. She also understands that She can call clinic at any time with any questions, concerns, or complaints.   Thank you for allowing me to participate in the care of this very pleasant patient.   Beckey Rutter, DNP, AGNP-C [facility]  CC:

## 2021-08-23 NOTE — ED Provider Notes (Signed)
? ?[facility] ?Provider Note ? ? ? Event Date/Time  ? First MD Initiated Contact with Patient 08/23/21 1255   ?  (approximate) ? ? ?History  ? ?Abdominal Pain ? ? ?HPI ? ?Tiffany Bailey is a 88 y.o. female here with abdominal pain.  Patient states that over the last week, she has had progressively worsening constipation.  Patient has fairly chronic constipation and metastatic breast cancer, has been seen and treated at her oncologist for this without much relief.  She states that over the last several days, she has been essentially unable to eat or drink.  She been taking bowel regimen and tried mineral without relief.  She had diffuse, severe, 10 of 10 pain so presents for further evaluation.  She was sent in by her oncologist.  No other complaints. ?  ? ? ?Physical Exam  ? ?Triage Vital Signs: ?ED Triage Vitals  ?Enc Vitals Group  ?   BP 08/23/21 1157 (!) 150/46  ?   Pulse Rate 08/23/21 1157 (!) 57  ?   Resp 08/23/21 1157 16  ?   Temp 08/23/21 1157 97.7 ?F (36.5 ?C)  ?   Temp Source 08/23/21 1157 Axillary  ?   SpO2 08/23/21 1157 100 %  ?   Weight 08/23/21 1153 95 lb 0.3 oz (43.1 kg)  ?   Height 08/23/21 1153 5' (1.524 m)  ?   Head Circumference --   ?   Peak Flow --   ?   Pain Score 08/23/21 1155 0  ?   Pain Loc --   ?   Pain Edu? --   ?   Excl. in [place]? --   ? ? ?Most recent vital signs: ?Vitals:  ? 08/23/21 1717 08/23/21 1959  ?BP: (!) 175/58 (!) 193/60  ?Pulse: (!) 55 69  ?Resp: 18 20  ?Temp: 98 ?F (36.7 ?C) 97.6 ?F (36.4 ?C)  ?SpO2: 100% 100%  ? ? ? ?General: Awake, no distress.  ?CV:  Good peripheral perfusion.  ?Resp:  Normal effort.  ?Abd:  Mild distention.  No rebound or guarding. ?Other:  No edema. ? ? ?ED Results / Procedures / Treatments  ? ?Labs ?(all labs ordered are listed, but only abnormal results are displayed) ?Labs Reviewed  ?GLUCOSE, CAPILLARY - Abnormal; Notable for the following components:  ?    Result Value  ? Glucose-Capillary 168 (*)   ? All other components within  normal limits  ?HEMOGLOBIN AND HEMATOCRIT, BLOOD - Abnormal; Notable for the following components:  ? Hemoglobin 10.7 (*)   ? HCT 32.0 (*)   ? All other components within normal limits  ?LACTIC ACID, PLASMA  ?URINALYSIS, ROUTINE W REFLEX MICROSCOPIC  ?HEMOGLOBIN G2X  ?BASIC METABOLIC PANEL  ? ? ? ?RADIOLOGY ?CT Abdo/pelvis: Large amount of stool burden, no obstruction, no free air ? ? ?I also independently reviewed and agree wit radiologist interpretations. ? ? ?PROCEDURES: ? ?Critical Care performed: No ? ? ? ?MEDICATIONS ORDERED IN ED: ?Medications  ?lactated ringers infusion ( Intravenous New Bag/Given 08/23/21 1734)  ?polyethylene glycol (MIRALAX / GLYCOLAX) packet 17 g (17 g Oral Given 08/23/21 1552)  ?aspirin EC tablet 81 mg (81 mg Oral Given 08/23/21 1749)  ?amLODipine (NORVASC) tablet 10 mg (has no administration in time range)  ?atorvastatin (LIPITOR) tablet 40 mg (40 mg Oral Given 08/23/21 2036)  ?cloNIDine (CATAPRES) tablet 0.1 mg (0.1 mg Oral Given 08/23/21 2037)  ?hydrALAZINE (APRESOLINE) tablet 10 mg (10 mg Oral Given 08/23/21 2114)  ?  metoprolol tartrate (LOPRESSOR) tablet 37.5 mg (37.5 mg Oral Given 08/23/21 2037)  ?irbesartan (AVAPRO) tablet 150 mg (has no administration in time range)  ?ondansetron (ZOFRAN) tablet 4 mg (has no administration in time range)  ?clopidogrel (PLAVIX) tablet 75 mg (has no administration in time range)  ?feeding supplement (ENSURE ENLIVE / ENSURE PLUS) liquid 237 mL (has no administration in time range)  ?heparin injection 5,000 Units (5,000 Units Subcutaneous Given 08/23/21 2057)  ?sodium bicarbonate tablet 650 mg (650 mg Oral Given 08/23/21 2114)  ?insulin aspart (novoLOG) injection 0-9 Units (0 Units Subcutaneous Patient Refused/Not Given 08/23/21 1754)  ?insulin glargine-yfgn (SEMGLEE) injection 4 Units (has no administration in time range)  ?megestrol (MEGACE) 400 MG/10ML suspension 625 mg (has no administration in time range)  ?lactulose (CHRONULAC) 10 GM/15ML solution 30 g (30 g Oral  Given 08/23/21 2050)  ?sorbitol, milk of mag, mineral oil, glycerin (SMOG) enema (960 mLs Rectal Given 08/23/21 1735)  ?acetaminophen (TYLENOL) tablet 650 mg (650 mg Oral Given 08/23/21 2114)  ? ? ? ?IMPRESSION / MDM / ASSESSMENT AND PLAN / ED COURSE  ?I reviewed the triage vital signs and the nursing notes. ?             ?               ? ? ?The patient is on the cardiac monitor to evaluate for evidence of arrhythmia and/or significant heart rate changes. ? ? ?Ddx:  ?SBO vs ileus with constipation, gastritis, IBS, diverticulitis, colitis, UTI ? ? ?MDM:  ?88 yo F here with abd pain and distension, limiting ability to eat/drink. Pt appears dehydrated clinically, and has been increasingly weak per discussion with her and husband. CT today obtained, shows significant stool burden throughout abdomen, though no signs of obstruction. Pt has been undergoing bowel regimen + enemeas at home w/o relief. ? ?While etiology of constipation is unclear, pt increasingly weak 2/2 poor Po intake with n/v, and intolerance of outpt regimen. Will admit for more aggressive bowel regiment, hydration, and management. Hospitalist consulted and will admit. ? ? ?MEDICATIONS GIVEN IN ED: ?Medications  ?lactated ringers infusion ( Intravenous New Bag/Given 08/23/21 1734)  ?polyethylene glycol (MIRALAX / GLYCOLAX) packet 17 g (17 g Oral Given 08/23/21 1552)  ?aspirin EC tablet 81 mg (81 mg Oral Given 08/23/21 1749)  ?amLODipine (NORVASC) tablet 10 mg (has no administration in time range)  ?atorvastatin (LIPITOR) tablet 40 mg (40 mg Oral Given 08/23/21 2036)  ?cloNIDine (CATAPRES) tablet 0.1 mg (0.1 mg Oral Given 08/23/21 2037)  ?hydrALAZINE (APRESOLINE) tablet 10 mg (10 mg Oral Given 08/23/21 2114)  ?metoprolol tartrate (LOPRESSOR) tablet 37.5 mg (37.5 mg Oral Given 08/23/21 2037)  ?irbesartan (AVAPRO) tablet 150 mg (has no administration in time range)  ?ondansetron (ZOFRAN) tablet 4 mg (has no administration in time range)  ?clopidogrel (PLAVIX) tablet 75 mg (has  no administration in time range)  ?feeding supplement (ENSURE ENLIVE / ENSURE PLUS) liquid 237 mL (has no administration in time range)  ?heparin injection 5,000 Units (5,000 Units Subcutaneous Given 08/23/21 2057)  ?sodium bicarbonate tablet 650 mg (650 mg Oral Given 08/23/21 2114)  ?insulin aspart (novoLOG) injection 0-9 Units (0 Units Subcutaneous Patient Refused/Not Given 08/23/21 1754)  ?insulin glargine-yfgn (SEMGLEE) injection 4 Units (has no administration in time range)  ?megestrol (MEGACE) 400 MG/10ML suspension 625 mg (has no administration in time range)  ?lactulose (CHRONULAC) 10 GM/15ML solution 30 g (30 g Oral Given 08/23/21 2050)  ?sorbitol, milk of mag, mineral oil, glycerin (  SMOG) enema (960 mLs Rectal Given 08/23/21 1735)  ?acetaminophen (TYLENOL) tablet 650 mg (650 mg Oral Given 08/23/21 2114)  ? ? ? ?Consults:  ?Hospitalist consulted for admission ? ? ?EMR reviewed  ?Office visit from Beckey Rutter, Oncology today ?Prior ED visits for abdominal pain ? ? ? ? ?FINAL CLINICAL IMPRESSION(S) / ED DIAGNOSES  ? ?Final diagnoses:  ?Slow transit constipation  ?Dehydration  ? ? ? ?Rx / DC Orders  ? ?ED Discharge Orders   ? ? None  ? ?  ? ? ? ?Note:  This document was prepared using Dragon voice recognition software and may include unintentional dictation errors. ?  ?Duffy Bruce, MD ?08/23/21 2236 ? ?

## 2021-08-23 NOTE — ED Triage Notes (Signed)
Pt comes into the ED via Cancer center c/o abd pain secondary to constipation.  Pt has been struggling with constipation x 4 weeks.  Pt hasnt had a BM in over a week with home therapy.  Per the [place] NP, the patient doesn't adhere to full management at home of the constipation and now she has a large bowel burden noted on imaging with dilated loops of bowel.  Cancer center concerned she now has an obstruction.  Abd rigid with cancer center.  Per NP there, the patient has poor family help for home management and might need possible admission.  ?

## 2021-08-23 NOTE — ED Notes (Signed)
Lab called to collect lactic acid sample.  ?

## 2021-08-23 NOTE — H&P (Signed)
History and Physical    Tiffany Bailey KGM:[id number] DOB: 04/03/1928 DOA: 08/23/2021  PCP: Allegra Grana, FNP (Confirm with patient/family/NH records and if not entered, this has to be entered at [facility] point of entry) Patient coming from: Home  I have personally briefly reviewed patient's old medical records in [facility] Link  Chief Complaint: Constipation  HPI: Tiffany Bailey is a 88 y.o. female with medical history significant of intermittent constipation, metastatic breast cancer chemotherapy/immune therapy, refractory HTN, CKD stage IIIb, chronic anemia, IDDM, CAD status post CABG, PAD, presented with worsening of constipation.  Has on and off constipation for the last few months, she has been taking as needed Linzess with some success.  However, starting 7 days ago she developed another episode of constipation, despite taking Linzess daily, constipation persisted.  She has had some periumbilical discomfort, mild nausea but no vomiting, she is able to tolerate her food and drinking water without vomiting, but she said very easy for her to get full.  No fever or chills.  No change of her medication recently, she does not take narcotics.  ED Course: CT abdomen pelvis showed large fecal impaction in the ascending and transverse colon.  Review of Systems: As per HPI otherwise 14 point review of systems negative.    Past Medical History:  Diagnosis Date   Anemia    Anginal pain (HCC)    Arthritis    Breast cancer (HCC) 07/19/2019   CKD (chronic kidney disease), stage III (HCC)    Coronary artery disease    a. 2002 CABG x 3 (LIMA-LAD, SVG-D1, SVG-RCA); b. s/p multiple PCI's.   Diabetes mellitus without complication (HCC)    Diastolic dysfunction    a. 12/2017 Echo: EF 55-60%, no rwma, Gr1 DD. Triv AI. Mild-mod MR/TR. PASP .   Edema    Failure to thrive in adult    GERD (gastroesophageal reflux disease)    Hb-SS disease without crisis (HCC) 10/23/2017   History of breast  cancer    HOH (hard of hearing)    Hyperlipidemia    Hypertension    Myocardial infarction [facility])    PAD (peripheral artery disease) (HCC)    a. s/p L AKA; b. 12/2018 PTA R peroneal & PTA/Stenting R SFA.   Palpitations     Past Surgical History:  Procedure Laterality Date   ABDOMINAL HYSTERECTOMY     ABOVE KNEE LEG AMPUTATION Left 2013   ANTERIOR VITRECTOMY Left 11/16/2015   Procedure: ANTERIOR VITRECTOMY;  Surgeon: Nevada Crane, MD;  Location: [facility] ORS;  Service: Ophthalmology;  Laterality: Left;   BLADDER SURGERY     CATARACT EXTRACTION W/PHACO Left 11/16/2015   Procedure: CATARACT EXTRACTION PHACO AND INTRAOCULAR LENS PLACEMENT (IOC);  Surgeon: Nevada Crane, MD;  Location: [facility] ORS;  Service: Ophthalmology;  Laterality: Left;  Lot # [id number] H [place]; 01:22.8 AP%:12.6 CDE: 10.46   CATARACT EXTRACTION W/PHACO Right 08/22/2016   Procedure: CATARACT EXTRACTION PHACO AND INTRAOCULAR LENS PLACEMENT (IOC);  Surgeon: Nevada Crane, MD;  Location: [facility] ORS;  Service: Ophthalmology;  Laterality: Right;  Lot # [id number] H [place]: 00:52.1 AP%:8.7 CDE: 4.99    CORONARY ANGIOPLASTY     STENT   CORONARY ARTERY BYPASS GRAFT     LOWER EXTREMITY ANGIOGRAPHY Right 01/14/2019   Procedure: LOWER EXTREMITY ANGIOGRAPHY;  Surgeon: Annice Needy, MD;  Location: [facility] INVASIVE CV LAB;  Service: Cardiovascular;  Laterality: Right;   MASTECTOMY     TUMOR REMOVAL     ABDOMINAL  reports that she has never smoked. She has never used smokeless tobacco. She reports that she does not drink alcohol and does not use drugs.  Allergies  Allergen Reactions   Amoxicillin Hives and Nausea Only   Gabapentin Other (See Comments)    AMS Confusion   Mirtazapine Itching    Dry mouth with ODT Denies SOB with medication   Other     UNKNOWN PAIN MEDICATION - unknown reaction    Family History  Problem Relation Age of Onset   Sudden death Mother    Arthritis Father    Stroke Father    Hypertension Sister     Hypertension Sister    Diabetes Grandchild      Prior to Admission medications   Medication Sig Start Date End Date Taking? Authorizing Provider  amLODipine (NORVASC) 10 MG tablet Take 1 tablet (10 mg total) by mouth daily. 05/23/21  Yes [name], Lyn Records, FNP  anastrozole (ARIMIDEX) 1 MG tablet Take 1 mg by mouth daily.   Yes [provider]  aspirin EC 81 MG tablet Take 1 tablet (81 mg total) by mouth daily. 01/15/17  Yes Cook, Jayce G, DO  atorvastatin (LIPITOR) 40 MG tablet TAKE 1 TABLET BY MOUTH  DAILY 04/05/21  Yes [name], Lyn Records, FNP  blood glucose meter kit and supplies KIT Dispense based on patient and insurance preference. Use up to four times daily as directed. (FOR ICD-9 250.00, 250.01). 01/13/17  Yes Cook, Jayce G, DO  Blood Glucose Monitoring Suppl (ONETOUCH VERIO REFLECT) w/Device KIT 1 Device by Does not apply route daily. 06/20/21  Yes [name], Lyn Records, FNP  cloNIDine (CATAPRES) 0.1 MG tablet Take 1 tablet (0.1 mg total) by mouth 2 (two) times daily. 06/28/21  Yes Allegra Grana, FNP  clopidogrel (PLAVIX) 75 MG tablet TAKE 1 TABLET BY MOUTH  DAILY 01/05/21  Yes [name], Tollie Pizza, MD  Continuous Blood Gluc Receiver (FREESTYLE LIBRE 2 READER) DEVI Use to check blood sugar continuously 06/29/21  Yes [name], Lyn Records, FNP  Continuous Blood Gluc Sensor (FREESTYLE LIBRE 2 SENSOR) MISC Apply 1 each topically every 14 (fourteen) days. 07/11/21  Yes [name], Lyn Records, FNP  feeding supplement (ENSURE ENLIVE / ENSURE PLUS) LIQD Take 237 mLs by mouth 2 (two) times daily between meals. 03/31/21  Yes Darlin Priestly, MD  glucose blood (ONE TOUCH TEST STRIPS) test strip Use to test blood sugar twice daily 06/15/21  Yes [name], Lyn Records, FNP  hydrALAZINE (APRESOLINE) 10 MG tablet Take 1 tablet (10 mg total) by mouth in the morning and at bedtime. 03/02/21  Yes [name], [name], PA-C  Insulin Glargine (TOUJEO SOLOSTAR [place]) Inject 4 Units into the skin daily.   Yes [provider]   Insulin Pen Needle (BD PEN NEEDLE NANO U/F) 32G X 4 MM MISC USE 1 DAILY 06/27/21  Yes [name], Lyn Records, FNP  Lancets [facility] ULTRASOFT) lancets 1 each by Other route 2 (two) times daily. 90 day supply for [employer].  Dx E11.9 06/26/21  Yes Allegra Grana, FNP  megestrol (MEGACE ES) 625 MG/5ML suspension Take 5 mLs (625 mg total) by mouth daily. 07/30/21  Yes Borders, Daryl Eastern, NP  Metoprolol Tartrate 37.5 MG TABS Take 37.5 mg by mouth 2 (two) times daily.   Yes [provider]  nitroGLYCERIN (NITROSTAT) 0.4 MG SL tablet Place 1 tablet (0.4 mg total) under the tongue every 5 (five) minutes as needed for chest pain. 07/11/21  Yes [name], [name], PA-C  ondansetron (ZOFRAN) 4 MG tablet Take 1 tablet (4 mg total) by mouth every 6 (six) hours as needed for nausea or vomiting. 05/24/21  Yes Rickard Patience, MD  valsartan (DIOVAN) 160 MG tablet Take 1 tablet (160 mg total) by mouth daily. 07/11/21  Yes [name], [name], PA-C  vitamin C (ASCORBIC ACID) 250 MG tablet Take 250 mg by mouth daily.   Yes [provider]  abemaciclib (VERZENIO) 50 MG tablet Take 1 tablet (50 mg total) by mouth 2 (two) times daily. Patient not taking: Reported on 08/09/2021 05/24/21   Rickard Patience, MD  HYDROcodone-acetaminophen [facility]) 5-325 MG tablet Take 1 tablet by mouth every 8 (eight) hours as needed for moderate pain. Patient not taking: Reported on 08/16/2021 07/30/21   Malachy Moan, NP    Physical Exam: Vitals:   08/23/21 1330 08/23/21 1441 08/23/21 1500 08/23/21 1530  BP: (!) 139/45 (!) 156/53 (!) 155/49 (!) 158/50  Pulse: (!) 55 (!) 58 (!) 51 (!) 56  Resp: 16 16  16   Temp:      TempSrc:      SpO2: 100% 100% 100% 100%  Weight:      Height:        Constitutional: NAD, calm, comfortable Vitals:   08/23/21 1330 08/23/21 1441 08/23/21 1500 08/23/21 1530  BP: (!) 139/45 (!) 156/53 (!) 155/49 (!) 158/50  Pulse: (!) 55 (!) 58 (!) 51 (!) 56  Resp: 16 16  16   Temp:      TempSrc:       SpO2: 100% 100% 100% 100%  Weight:      Height:       Eyes: PERRL, lids and conjunctivae normal ENMT: Mucous membranes are moist. Posterior pharynx clear of any exudate or lesions.Normal dentition.  Neck: normal, supple, no masses, no thyromegaly Respiratory: clear to auscultation bilaterally, no wheezing, no crackles. Normal respiratory effort. No accessory muscle use.  Cardiovascular: Regular rate and rhythm, no murmurs / rubs / gallops. No extremity edema. 2+ pedal pulses. No carotid bruits.  Abdomen: Mild tenderness on periumbilical area, no rebound no guarding no masses palpated. No hepatosplenomegaly. Bowel sounds positive.  Musculoskeletal: no clubbing / cyanosis. No joint deformity upper and lower extremities. Good ROM, no contractures. Normal muscle tone.  Skin: no rashes, lesions, ulcers. No induration Neurologic: CN 2-12 grossly intact. Sensation intact, DTR normal. Strength 5/5 in all 4.  Psychiatric: Normal judgment and insight. Alert and oriented x 3. Normal mood.     Labs on Admission: I have personally reviewed following labs and imaging studies  CBC: Recent Labs  Lab 08/23/21 1027  WBC 8.0  NEUTROABS 6.2  HGB 9.6*  HCT 30.0*  MCV 100.0  PLT 316   Basic Metabolic Panel: Recent Labs  Lab 08/23/21 1027  NA 132*  K 4.7  CL 106  CO2 18*  GLUCOSE 183*  BUN 40*  CREATININE 1.73*  CALCIUM 8.5*  MG 2.2   GFR: Estimated Creatinine Clearance: 14.4 mL/min (A) (by C-G formula based on SCr of 1.73 mg/dL (H)). Liver Function Tests: Recent Labs  Lab 08/23/21 1027  AST 63*  ALT 24  ALKPHOS 59  BILITOT 0.8  PROT 6.1*  ALBUMIN 3.3*   No results for input(s): LIPASE, AMYLASE in the last 168 hours. No results for input(s): AMMONIA in the last 168 hours. Coagulation Profile: No results for input(s): INR, PROTIME in the last 168 hours. Cardiac Enzymes: No results for input(s): CKTOTAL, CKMB, CKMBINDEX, TROPONINI in the last 168 hours. BNP (  last 3  results) No results for input(s): PROBNP in the last 8760 hours. HbA1C: No results for input(s): HGBA1C in the last 72 hours. CBG: No results for input(s): GLUCAP in the last 168 hours. Lipid Profile: No results for input(s): CHOL, HDL, LDLCALC, TRIG, CHOLHDL, LDLDIRECT in the last 72 hours. Thyroid Function Tests: No results for input(s): TSH, T4TOTAL, FREET4, T3FREE, THYROIDAB in the last 72 hours. Anemia Panel: No results for input(s): VITAMINB12, FOLATE, FERRITIN, TIBC, IRON, RETICCTPCT in the last 72 hours. Urine analysis:    Component Value Date/Time   COLORURINE STRAW (A) 04/12/2021 0953   APPEARANCEUR CLEAR (A) 04/12/2021 0953   APPEARANCEUR Clear 03/05/2019 1210   LABSPEC 1.005 04/12/2021 0953   PHURINE 6.0 04/12/2021 0953   GLUCOSEU 50 (A) 04/12/2021 0953   HGBUR NEGATIVE 04/12/2021 0953   BILIRUBINUR NEGATIVE 04/12/2021 0953   BILIRUBINUR Negative 03/05/2019 1210   KETONESUR NEGATIVE 04/12/2021 0953   PROTEINUR NEGATIVE 04/12/2021 0953   NITRITE NEGATIVE 04/12/2021 0953   LEUKOCYTESUR NEGATIVE 04/12/2021 0953    Radiological Exams on Admission: CT ABDOMEN PELVIS WO CONTRAST  Result Date: 08/23/2021 CLINICAL DATA:  Abdominal pain EXAM: CT ABDOMEN AND PELVIS WITHOUT CONTRAST TECHNIQUE: Multidetector CT imaging of the abdomen and pelvis was performed following the standard protocol without IV contrast. RADIATION DOSE REDUCTION: This exam was performed according to the departmental dose-optimization program which includes automated exposure control, adjustment of the mA and/or kV according to patient size and/or use of iterative reconstruction technique. COMPARISON:  07/06/2021 FINDINGS: Lower chest: Coronary artery calcifications are seen. There is small to moderate right pleural effusion with interval increase. Infiltrates in the posterior right lower lung fields may suggest compression atelectasis or pneumonia. There are linear densities in the right middle lobe, lingula and  left lower lobe suggesting scarring or subsegmental atelectasis. Metallic sutures seen in the sternum. Hepatobiliary: No focal abnormality is seen in the liver. Gallbladder is not distinctly seen. Pancreas: There is pancreatic atrophy. No focal abnormality is seen. Spleen: Spleen is small in size. Adrenals/Urinary Tract: Adrenals are unremarkable. There is no hydronephrosis. There are multiple calcific densities in both kidneys some of which appear to be calcification in the renal artery branches. This may be nonobstructing renal stones. There are few renal cysts largest in the upper pole of right kidney measuring 5.1 cm. Urinary bladder is distended. Stomach/Bowel: Small hiatal hernia is seen. Stomach is not distended. There is no significant small bowel dilation. Appendix is not distinctly seen. Large amount of stool is seen in ascending and transverse colon. There is no fecal impaction in the rectosigmoid. There is no dilation of descending and sigmoid colon. Vascular/Lymphatic: Extensive arterial calcifications are seen. Reproductive: Uterus is not seen. Other: There is no ascites or pneumoperitoneum. Small umbilical hernia containing fat is seen. Musculoskeletal: Unremarkable. IMPRESSION: Large amount of stool is seen in the ascending and transverse colon without evidence of fecal impaction in the rectosigmoid. There is no evidence intestinal obstruction or pneumoperitoneum. There is no hydronephrosis. Right renal cysts. There are multiple small calcifications in both kidneys suggesting nonobstructing renal stones and possibly calcifications in the renal artery branches. Small to moderate right pleural effusion. Infiltrates in the right lower lung fields may suggest atelectasis/pneumonia. Other findings as described in the body of the report. Electronically Signed   By: Ernie Avena M.D.   On: 08/23/2021 14:22    EKG: None  Assessment/Plan Principal Problem:   Fecal impaction (HCC)  (please  populate well all problems here in Problem  List. (For example, if patient is on BP meds at home and you resume or decide to hold them, it is a problem that needs to be her. Same for CAD, COPD, HLD and so on)  Acute fecal impaction -We will try regimen of p.o. lactulose 3 times daily plus soap and thud enema. If not effective, will consider GI consult.  Explained to patient and her daughter at bedside, both agreed with the plan. -No signs of SBO as of now. -Check TSH. -Of note, patient takes several medications that can potentially lead to constipation such as amlodipine and her hormone therapy drug.  Refractory HTN -Fair control, continue current regimen of amlodipine, metoprolol, clonidine, hydralazine and ARB.  CKD stage IIIa with chronic non-anion gap metabolic acidosis -Creatinine up stable, euvolemic, and bicarb p.o.  Chronic normocytic anemia -Might related to CKD and chemotherapy, outpatient follow-up with heme-onc.  IDDM -Continue Lantus 40 units daily, add sliding scale.  CAD -Continue aspirin Plavix and statin.  Metastatic breast cancer -On active chemo and hormone therapy, patient is full code.  DVT prophylaxis: Heparin subcu Code Status: Full code Family Communication: Daughter at bedside Disposition Plan: Expect less than 2 midnight hospital stay Consults called: NOne Admission status: MedSurg observation   Emeline General MD [facility] Pager [phone]  08/23/2021, 4:27 PM

## 2021-08-24 ENCOUNTER — Encounter: Payer: Self-pay | Admitting: Internal Medicine

## 2021-08-24 ENCOUNTER — Telehealth: Payer: Self-pay | Admitting: Family

## 2021-08-24 DIAGNOSIS — R339 Retention of urine, unspecified: Secondary | ICD-10-CM | POA: Diagnosis not present

## 2021-08-24 DIAGNOSIS — L89152 Pressure ulcer of sacral region, stage 2: Secondary | ICD-10-CM | POA: Diagnosis present

## 2021-08-24 DIAGNOSIS — K5641 Fecal impaction: Secondary | ICD-10-CM | POA: Diagnosis not present

## 2021-08-24 DIAGNOSIS — I10 Essential (primary) hypertension: Secondary | ICD-10-CM | POA: Diagnosis not present

## 2021-08-24 LAB — BASIC METABOLIC PANEL
Anion gap: 6 (ref 5–15)
BUN: 33 mg/dL — ABNORMAL HIGH (ref 8–23)
CO2: 19 mmol/L — ABNORMAL LOW (ref 22–32)
Calcium: 8.1 mg/dL — ABNORMAL LOW (ref 8.9–10.3)
Chloride: 110 mmol/L (ref 98–111)
Creatinine, Ser: 1.49 mg/dL — ABNORMAL HIGH (ref 0.44–1.00)
GFR, Estimated: 33 mL/min — ABNORMAL LOW (ref >60)
Glucose, Bld: 139 mg/dL — ABNORMAL HIGH (ref 70–99)
Potassium: 4.7 mmol/L (ref 3.5–5.1)
Sodium: 135 mmol/L (ref 135–145)

## 2021-08-24 LAB — GLUCOSE, CAPILLARY
Glucose-Capillary: 169 mg/dL — ABNORMAL HIGH (ref 70–99)
Glucose-Capillary: 102 mg/dL — ABNORMAL HIGH (ref 70–99)
Glucose-Capillary: 114 mg/dL — ABNORMAL HIGH (ref 70–99)

## 2021-08-24 LAB — RESP PANEL BY RT-PCR (FLU A&B, COVID) ARPGX2
Influenza A by PCR: NEGATIVE
Influenza B by PCR: NEGATIVE
SARS Coronavirus 2 by RT PCR: NEGATIVE

## 2021-08-24 MED ORDER — BUTALBITAL-APAP-CAFFEINE 50-325-40 MG PO TABS
1.0000 | ORAL_TABLET | Freq: Once | ORAL | Status: AC | PRN
  Administered 2021-08-24: 1 via ORAL
  Filled 2021-08-24: qty 1

## 2021-08-24 MED ORDER — SENNA 8.6 MG PO TABS: 1.0000 | ORAL_TABLET | Freq: Every day | ORAL | 0 refills | Status: AC

## 2021-08-24 MED ORDER — POLYETHYLENE GLYCOL 3350 17 G PO PACK: 17.0000 g | PACK | Freq: Every day | ORAL | 0 refills | Status: AC

## 2021-08-24 MED ORDER — LACTATED RINGERS IV SOLN: INTRAVENOUS | Status: DC

## 2021-08-24 MED ORDER — DULCOLAX 5 MG PO TBEC: 5.0000 mg | DELAYED_RELEASE_TABLET | Freq: Every day | ORAL | 0 refills | Status: AC | PRN

## 2021-08-24 NOTE — Assessment & Plan Note (Signed)
Stable.  Monitor CBC in follow-up. ? ?Patient reportedly had blood with her bowel movement after enemas and aggressive bowel regimen.  Hemoglobin was rechecked and had increased from 9.6 up to 10.7.  No further bleeding. ?

## 2021-08-24 NOTE — Progress Notes (Signed)
Discharge Note: ?Reviewed discharge instructions with pt. Son, [place], at bedside. Son and pt verbalized understanding. PT d/c to home with a foley catheter, incentive spirometer, and also all personal belongings. Iv cath intact when removed. Staff wheeled pt out. Pt transported to home via family private vehicle.  ?

## 2021-08-24 NOTE — Assessment & Plan Note (Signed)
Covered with basal and sliding scale insulin during admission.  Resume home regimen at discharge.  PCP follow-up. ?

## 2021-08-24 NOTE — TOC Initial Note (Signed)
Transition of Care (TOC) - Initial/Assessment Note  ? ? ?Patient Details  ?Name: Tiffany Bailey ?MRN: [id number] ?Date of Birth: 1927/07/25 ? ?Transition of Care (TOC) CM/SW Contact:    ?Conception Oms, RN ?Phone Number: ?08/24/2021, 10:07 AM ? ?Clinical Narrative:       patient from home, followed by Oncology for Breast Cancer, TOC will monitor for needs at this time, no apparent needs ? ?Transition of Care (TOC) Screening Note ? ? ?Patient Details  ?Name: Tiffany Bailey ?Date of Birth: 09/23/1927 ? ? ?Transition of Care (TOC) CM/SW Contact:    ?Conception Oms, RN ?Phone Number: ?08/24/2021, 10:07 AM ? ? ? ?Transition of Care Department [facility]) has reviewed patient and no TOC needs have been identified at this time. We will continue to monitor patient advancement through interdisciplinary progression rounds. If new patient transition needs arise, please place a TOC consult. ?           ? ? ?  ?  ? ? ?Patient Goals and CMS Choice ?  ?  ?  ? ?Expected Discharge Plan and Services ?  ?  ?  ?  ?  ?                ?  ?  ?  ?  ?  ?  ?  ?  ?  ?  ? ?Prior Living Arrangements/Services ?  ?  ?  ?       ?  ?  ?  ?  ? ?Activities of Daily Living ?  ?ADL Screening (condition at time of admission) ?Patient's cognitive ability adequate to safely complete daily activities?: Yes ?Is the patient deaf or have difficulty hearing?: No ?Does the patient have difficulty seeing, even when wearing glasses/contacts?: No ?Does the patient have difficulty concentrating, remembering, or making decisions?: No ?Patient able to express need for assistance with ADLs?: Yes ?Does the patient have difficulty dressing or bathing?: Yes ?Independently performs ADLs?: No ?Communication: Independent ?Dressing (OT): Needs assistance ?Is this a change from baseline?: Pre-admission baseline ?Grooming: Needs assistance ?Is this a change from baseline?: Pre-admission baseline ?Feeding: Needs assistance ?Is this a change from baseline?: Pre-admission  baseline ?Bathing: Needs assistance ?Is this a change from baseline?: Pre-admission baseline ?Toileting: Needs assistance ?Is this a change from baseline?: Pre-admission baseline ?In/Out Bed: Dependent ?Is this a change from baseline?: Pre-admission baseline ?Walks in Home: Dependent ?Is this a change from baseline?: Pre-admission baseline ?Does the patient have difficulty walking or climbing stairs?: Yes ?Weakness of Legs: Right ?Weakness of Arms/Hands: None ? ?Permission Sought/Granted ?  ?  ?   ?   ?   ?   ? ?Emotional Assessment ?  ?  ?  ?  ?  ?  ? ?Admission diagnosis:  Fecal impaction ([place]) [K56.41] ?Patient Active Problem List  ? Diagnosis Date Noted  ? Fecal impaction ([place]) 08/23/2021  ? Hyperglycemia 04/26/2021  ? Encounter for antineoplastic chemotherapy 04/12/2021  ? Pressure injury of skin 03/31/2021  ? Malignant neoplasm of breast in female, estrogen receptor positive ([place]) 03/30/2021  ? Pleural effusion 03/14/2021  ? Cough 02/13/2021  ? Cancer ([place]) 11/07/2020  ? Type 2 diabetes mellitus with diabetic neuropathy, unspecified ([place]) 11/02/2020  ? Generalized weakness 12/15/2019  ? Osteopenia 10/25/2019  ? Use of anastrozole (Arimidex) 08/18/2019  ? Anemia due to stage 3a chronic kidney disease ([place]) 08/18/2019  ? 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 Nausea 08/03/2019  ? Breast cancer ([place]) 07/19/2019  ? Goals  of care, counseling/discussion 07/19/2019  ? Lung mass 02/10/2019  ? Constipation 01/27/2019  ? Headache 01/25/2019  ? Right leg swelling 01/25/2019  ? Metastatic breast cancer ([place]) 01/05/2019  ? Anxiety and depression 12/16/2018  ? Hx of AKA (above knee amputation), left ([employer]) 08/26/2018  ? Left arm swelling 07/01/2018  ? Carotid stenosis 12/19/2017  ? CAD (coronary artery disease) 10/23/2017  ? Hb-SS disease without crisis ([place]) 10/23/2017  ? History of CHF (congestive heart failure) 10/23/2017  ? Old myocardial infarction 10/23/2017  ? Hand cramp 10/17/2017  ? Acute non-recurrent maxillary  sinusitis 10/17/2017  ? Skin lesion 09/29/2017  ? Diarrhea 04/23/2017  ? Weight loss 09/12/2016  ? CKD stage 3 due to type 2 diabetes mellitus ([place]) 08/14/2016  ? PAD (peripheral artery disease) ([place]) 08/14/2016  ? DM (diabetes mellitus) ([place]) 03/25/2016  ? Chronic tension-type headache, not intractable 03/25/2016  ? Essential hypertension 03/25/2016  ? CAD (coronary artery disease), native coronary artery 03/25/2016  ? GERD (gastroesophageal reflux disease) 03/25/2016  ? Hyperlipidemia 03/25/2016  ? History of breast cancer 03/25/2016  ? Acute kidney injury superimposed on chronic kidney disease ([place]) 01/28/2016  ? Hyperkalemia 01/28/2016  ? Hyponatremia 01/28/2016  ? Urinary retention 01/28/2016  ? Hypertensive urgency 01/15/2016  ? History of nonadherence to medical treatment 09/08/2015  ? Epigastric pain 08/10/2014  ? Ischemic heart disease due to coronary artery obstruction ([place]) 01/07/2013  ? ?PCP:  [name], FNP ?Pharmacy:   ?[facility] ([facility] [place] [address] [address] ?Phone: [phone] Fax: [phone] ? ?[facility] ([facility] ) - [place], [address] ?[address] ?[place] [address] ?Phone: [phone] Fax: [phone] ? ? ? ? ?Social Determinants of Health (SDOH) Interventions ?  ? ?Readmission Risk Interventions ?No flowsheet data found. ? ? ?

## 2021-08-24 NOTE — Evaluation (Signed)
Physical Therapy Evaluation ?Patient Details ?Name: Tiffany Bailey ?MRN: [id number] ?DOB: 06/15/1928 ?Today's Date: 08/24/2021 ? ?History of Present Illness ? 88 y.o. female with medical history significant of intermittent constipation, metastatic breast cancer chemotherapy/immune therapy, refractory HTN, CKD stage IIIb, chronic anemia, IDDM, CAD status post CABG, PAD, presented with worsening of constipation.  ?Clinical Impression ? Prior to hospital admission, pt was modified independent with w/c level transfers (does not use L LE prosthesis d/t not fitting well); lives with her son in 1 level home with ramp to enter.  Currently pt is SBA with transfer recliner to bed (increased time to perform on own d/t pt c/o being tired) and min assist for R LE sit to semi-supine in bed (pt reports being able to have her son assist at night if she needs assist getting into bed).  Pt demonstrating generalized weakness; pt also requiring cueing to remember to manage foley catheter with transfers (pt's nurse and TOC notified of this and pt's mobility level).  Pt would benefit from skilled PT to address noted impairments and functional limitations (see below for any additional details).  Upon hospital discharge, pt would benefit from HHPT and increased support services at home (for intermittent assist).   ? ?Recommendations for follow up therapy are one component of a multi-disciplinary discharge planning process, led by the attending physician.  Recommendations may be updated based on patient status, additional functional criteria and insurance authorization. ? ?Follow Up Recommendations Home health PT ? ?  ?Assistance Recommended at Discharge Intermittent Supervision/Assistance  ?Patient can return home with the following ? A little help with walking and/or transfers;A little help with bathing/dressing/bathroom;Assistance with cooking/housework;Assist for transportation;Help with stairs or ramp for entrance ? ?  ?Equipment  Recommendations None recommended by PT (pt has needed DME at home already)  ?Recommendations for Other Services ?    ?  ?Functional Status Assessment Patient has had a recent decline in their functional status and/or demonstrates limited ability to make significant improvements in function in a reasonable and predictable amount of time  ? ?  ?Precautions / Restrictions Precautions ?Precautions: Fall ?Precaution Comments: foley catheter ?Restrictions ?Weight Bearing Restrictions: No  ? ?  ? ?Mobility ? Bed Mobility ?Overal bed mobility: Needs Assistance ?Bed Mobility: Sit to Supine ?  ?  ?  ?Sit to supine: Min assist ?  ?General bed mobility comments: Sit to supine in bed with min assist for R LE (pt reports she needs assist with R LE into bed sometimes at home and will call her son to assist her since he's home at night). ?  ? ?Transfers ?Overall transfer level: Needs assistance ?Equipment used: None ?Transfers: Bed to chair/wheelchair/BSC ?  ?Stand pivot transfers: Supervision ?  ?  ?  ?  ?General transfer comment: stand pivot recliner to bed with UE support on furniture ?  ? ?Ambulation/Gait ?  ?  ?  ?  ?  ?  ?  ?General Gait Details: deferred (pt non-ambulatory at baseline) ? ?Stairs ?  ?  ?  ?  ?  ? ?Wheelchair Mobility ?  ? ?Modified Rankin (Stroke Patients Only) ?  ? ?  ? ?Balance Overall balance assessment: Needs assistance ?Sitting-balance support: No upper extremity supported, Feet supported ?Sitting balance-Leahy Scale: Normal ?Sitting balance - Comments: steady sitting reaching outside BOS ?  ?Standing balance support: Single extremity supported ?Standing balance-Leahy Scale: Fair ?Standing balance comment: pt requiring at least single UE support for static standing balance ?  ?  ?  ?  ?  ?  ?  ?  ?  ?  ?  ?   ? ? ? ?  Pertinent Vitals/Pain Pain Assessment ?Pain Assessment: No/denies pain  ? ? ?Home Living Family/patient expects to be discharged to:: Private residence ?Living Arrangements: Children (pt's  son) ?Available Help at Discharge: Family;Personal care attendant;Available PRN/intermittently (son home at night) ?Type of Home: House ?Home Access: Ramped entrance ?  ?  ?  ?Home Layout: One level ?Home Equipment: Rolling Walker (2 wheels);BSC/3in1;Wheelchair - manual;Shower seat;Grab bars - tub/shower;Grab bars - toilet ?Additional Comments: Has a PCA M-F for two hours in the morning (7:30-9:30AM); son works during the day and is then home during the evening/night (pt will call son as needed to assist when son is home)  ?  ?Prior Function Prior Level of Function : Needs assist ?  ?  ?  ?  ?  ?  ?Mobility Comments: Pt reports L LE prosthetic does not fit (so does not wear).  Performs stand pivot transfers at baseline (does not ambulate).  Modified independent with w/c level transfers and propels self around home in w/c using B UE's and R LE.  Normally sits on couch during the day (not w/c). ?ADLs Comments: Per OT evaluation:  "Pt has PCA for 2 hrs to assist with self care and IADLs. Pt reports being able to perform toileting independently from wheelchair <> toilet during the day." ?  ? ? ?Hand Dominance  ? Dominant Hand: Right ? ?  ?Extremity/Trunk Assessment  ? Upper Extremity Assessment ?Upper Extremity Assessment: Generalized weakness ?  ? ?Lower Extremity Assessment ?Lower Extremity Assessment: Generalized weakness (L LE amputation) ?  ? ?Cervical / Trunk Assessment ?Cervical / Trunk Assessment: Normal  ?Communication  ? Communication: No difficulties  ?Cognition Arousal/Alertness: Awake/alert ?Behavior During Therapy: [facility] for tasks assessed/performed ?Overall Cognitive Status: Within Functional Limits for tasks assessed ?  ?  ?  ?  ?  ?  ?  ?  ?  ?  ?  ?  ?  ?  ?  ?  ?  ?  ?  ? ?  ?General Comments  Nursing cleared pt for participation in physical therapy.  Pt agreeable to PT session. ? ? ?  ?Exercises    ? ?Assessment/Plan  ?  ?PT Assessment Patient needs continued PT services  ?PT Problem List Decreased  strength;Decreased activity tolerance;Decreased mobility;Decreased balance ? ?   ?  ?PT Treatment Interventions DME instruction;Functional mobility training;Therapeutic activities;Therapeutic exercise;Balance training;Patient/family education   ? ?PT Goals (Current goals can be found in the Care Plan section)  ?Acute Rehab PT Goals ?Patient Stated Goal: to improve strength and endurance ?PT Goal Formulation: With patient ?Time For Goal Achievement: 09/07/21 ?Potential to Achieve Goals: Fair ? ?  ?Frequency Min 2X/week ?  ? ? ?Co-evaluation   ?  ?  ?  ?  ? ? ?  ?AM-PAC PT "6 Clicks" Mobility  ?Outcome Measure Help needed turning from your back to your side while in a flat bed without using bedrails?: None ?Help needed moving from lying on your back to sitting on the side of a flat bed without using bedrails?: None ?Help needed moving to and from a bed to a chair (including a wheelchair)?: A Little ?Help needed standing up from a chair using your arms (e.g., wheelchair or bedside chair)?: A Little ?Help needed to walk in hospital room?: Total ?Help needed climbing 3-5 steps with a railing? : Total ?6 Click Score: 16 ? ?  ?End of Session Equipment Utilized During Treatment: Gait belt ?Activity Tolerance: Patient tolerated treatment well ?Patient left: in bed;with call bell/phone  within reach;with bed alarm set;with nursing/sitter in room ?Nurse Communication: Mobility status;Precautions ?PT Visit Diagnosis: Other abnormalities of gait and mobility (R26.89);Muscle weakness (generalized) (M62.81) ?  ? ?Time: 7672-0947 ?PT Time Calculation (min) (ACUTE ONLY): 24 min ? ? ?Charges:   PT Evaluation ?$PT Eval Low Complexity: 1 Low ?  ?  ?   ? ?Leitha Bleak, PT ?08/24/21, 3:22 PM ? ? ?

## 2021-08-24 NOTE — Assessment & Plan Note (Signed)
On aspirin and statin °

## 2021-08-24 NOTE — Assessment & Plan Note (Signed)
Patient has fairly resistant hypertension on multiple agents.  Continue current regimen with amlodipine, metoprolol, clonidine, hydralazine, ARB. ?

## 2021-08-24 NOTE — Consult Note (Signed)
[facility] Nurse wound consult note ?Consultation was completed by review of records, images and assistance from the bedside nurse/clinical staff.  ?Reason for Consult: wound on sacrum; reviewed notes, began at home little over a week ago. ?Wound type: Stage 2 Pressure Injury  ?Pressure Injury POA: Yes ?Measurement:"dime sized" per nursing flow sheet  ?Wound ZOX:WRUEA, pink ?Drainage (amount, consistency, odor) none  ?Periwound:intact  ?Dressing procedure/placement/frequency:  ?Continue silicone foam per nursing skin care order set. ? ?Re consult if needed, will not follow at this time. ?Thanks ? Braniya Farrugia [facility] MSN, RN,CWOCN, CNS, CWON-AP [phone])  ?

## 2021-08-24 NOTE — Assessment & Plan Note (Signed)
Chronic, stable. ?Continue aspirin, metoprolol and Lipitor. ?

## 2021-08-24 NOTE — TOC Progression Note (Signed)
Transition of Care (TOC) - Progression Note  ? ? ?Patient Details  ?Name: Tiffany Bailey ?MRN: [id number] ?Date of Birth: Oct 31, 1927 ? ?Transition of Care (TOC) CM/SW Contact  ?Conception Oms, RN ?Phone Number: ?08/24/2021, 1:18 PM ? ?Clinical Narrative:    ?[name] has accepted the patient for Wound care, Foley Care , PT and OT, Start of care next week early,  ? ? ?  ?  ? ?Expected Discharge Plan and Services ?  ?  ?  ?  ?  ?Expected Discharge Date: 08/24/21               ?  ?  ?  ?  ?  ?  ?  ?  ?  ?  ? ? ?Social Determinants of Health (SDOH) Interventions ?  ? ?Readmission Risk Interventions ?No flowsheet data found. ? ?

## 2021-08-24 NOTE — Hospital Course (Signed)
HPI on admission: "Tiffany Bailey is a 88 y.o. female with medical history significant of intermittent constipation, metastatic breast cancer chemotherapy/immune therapy, refractory HTN, CKD stage IIIb, chronic anemia, IDDM, CAD status post CABG, PAD, presented with worsening of constipation. ?  ?Has on and off constipation for the last few months, she has been taking as needed Linzess with some success.  However, starting 7 days ago she developed another episode of constipation, despite taking Linzess daily, constipation persisted.  She has had some periumbilical discomfort, mild nausea but no vomiting, she is able to tolerate her food and drinking water without vomiting, but she said very easy for her to get full.  No fever or chills.  No change of her medication recently, she does not take narcotics. ?  ?ED Course: CT abdomen pelvis showed large fecal impaction in the ascending and transverse colon." ?

## 2021-08-24 NOTE — Assessment & Plan Note (Signed)
Resolved with aggressive bowel regimen including enemas. ?Continue scheduled stool softeners and as needed stimulant laxatives. ?Follow-up outpatient to consider resuming Linzess if this was helpful in the past. ?Ensure adequate oral hydration. ?

## 2021-08-24 NOTE — Progress Notes (Signed)
Met with the patient to discuss DC plan and needs ?She lives at home with her son, she stated that she has many children and they all help her, They take turns providing transportation to the doctor, She has DME at home and does not need more, She has a private duty aide that comes 2 hours a day from [place] by an angel ?She was accepted by [facility] for RN, PT and OT, they will start Tuesday or Wed. ?She has no additional needs ?

## 2021-08-24 NOTE — Assessment & Plan Note (Signed)
On chemotherapy.  Followed by Dr. Tasia Catchings. ?Recommend outpatient palliative care consultation for goals of care discussions. ?

## 2021-08-24 NOTE — Discharge Summary (Signed)
Physician Discharge Summary   Patient: Tiffany Bailey MRN: [id number] DOB: 1928/06/16  Admit date:     08/23/2021  Discharge date: 08/24/21  Discharge Physician: Ezekiel Slocumb   PCP: [name], FNP   Recommendations at discharge:   Follow-up with primary care in 1 to 2 weeks Follow-up with urology in 1 to 2 weeks for Foley catheter removal and voiding trial Follow-up with oncology as previously scheduled Follow-up on severe constipation and adjust bowel regimen as needed Monitor sacral wound closely for progression or signs of infection Recommend referral to palliative care for goals of care discussions in the setting of metastatic cancer   Discharge Diagnoses: Active Problems:   Urinary retention   Essential hypertension   CAD (coronary artery disease), native coronary artery   PAD (peripheral artery disease) (HCC)   Anxiety and depression   Metastatic breast cancer (HCC)   Constipation   Breast cancer ([place])   Anemia due to stage 3a chronic kidney disease (HCC)   Type 2 diabetes mellitus with diabetic neuropathy, unspecified (HCC)   Pressure injury of sacral region, stage 2 ([place])  Principal Problem (Resolved):   Fecal impaction [facility])  Hospital Course: HPI on admission: "Tiffany Bailey is a 88 y.o. female with medical history significant of intermittent constipation, metastatic breast cancer chemotherapy/immune therapy, refractory HTN, CKD stage IIIb, chronic anemia, IDDM, CAD status post CABG, PAD, presented with worsening of constipation.   Has on and off constipation for the last few months, she has been taking as needed Linzess with some success.  However, starting 7 days ago she developed another episode of constipation, despite taking Linzess daily, constipation persisted.  She has had some periumbilical discomfort, mild nausea but no vomiting, she is able to tolerate her food and drinking water without vomiting, but she said very easy for her to get full.  No  fever or chills.  No change of her medication recently, she does not take narcotics.   ED Course: CT abdomen pelvis showed large fecal impaction in the ascending and transverse colon."  Assessment and Plan: * Fecal impaction (HCC)-resolved as of 08/24/2021 Resolved with aggressive bowel regimen including enemas. Continue scheduled stool softeners and as needed stimulant laxatives. Follow-up outpatient to consider resuming Linzess if this was helpful in the past. Ensure adequate oral hydration.  Urinary retention Per chart review, this appears to be a chronic issue with primary care mentioning urology referral but no record found of urology encounters.  Patient has required multiple in/out catheterizations since admission with retained volumes as high as 1 L.   Discussed with urology.   Foley catheter placed today. Follow-up with urology in clinic in 1 to 2 weeks for catheter removal and voiding trial.  Pressure injury of sacral region, stage 2 ([place]) Present on admission.  I agree with the wound description as outlined below --Frequent repositioning -- Foam dressing to sacrum, change every 3 days or as needed for soiling -- Monitor closely for signs of infection or progression -- Home health RN to assist with wound care monitoring  Pressure Injury 03/30/21 Sacrum Mid Stage 2 -  Partial thickness loss of dermis presenting as a shallow open injury with a red, pink wound bed without slough. Small pressure sore withs scab (Active)  03/30/21 2236  Location: Sacrum  Location Orientation: Mid  Staging: Stage 2 -  Partial thickness loss of dermis presenting as a shallow open injury with a red, pink wound bed without slough.  Wound Description (Comments): Small  pressure sore withs scab  Present on Admission: Yes        Type 2 diabetes mellitus with diabetic neuropathy, unspecified ([place]) Covered with basal and sliding scale insulin during admission.  Resume home regimen at discharge.  PCP  follow-up.  Anemia due to stage 3a chronic kidney disease (HCC) Stable.  Monitor CBC in follow-up.  Patient reportedly had blood with her bowel movement after enemas and aggressive bowel regimen.  Hemoglobin was rechecked and had increased from 9.6 up to 10.7.  No further bleeding.  Breast cancer ([place]) Metastatic.  Currently undergoing chemotherapy.  Followed by Dr. Tasia Catchings.  Constipation Improved with aggressive bowel regimen including enemas.  Continue on scheduled stool softeners with as needed laxatives.  Outpatient follow-up closely.  Ensure adequate hydration.  Metastatic breast cancer ([place]) On chemotherapy.  Followed by Dr. Tasia Catchings. Recommend outpatient palliative care consultation for goals of care discussions.  Anxiety and depression Continue home regimen  PAD (peripheral artery disease) (HCC) On aspirin and statin  CAD (coronary artery disease), native coronary artery Chronic, stable. Continue aspirin, metoprolol and Lipitor.  Essential hypertension Patient has fairly resistant hypertension on multiple agents.  Continue current regimen with amlodipine, metoprolol, clonidine, hydralazine, ARB.          Subjective: Patient reported feeling much better after bowel movements.  Eating breakfast and tolerating well.  Asks for the rest of her breakfast to be warmed up so she can eat more.  Reports overall feeling better and has no acute complaints.  She is not sure if she is ever seen urology but agreeable to Foley if needed.    Consultants: Urology to follow-up outpatient Procedures performed: In and out catheterizations, Foley catheter placement Disposition: Home health Diet recommendation:  Discharge Diet Orders (From admission, onward)     Start     Ordered   08/24/21 0000  Diet - low sodium heart healthy        08/24/21 1249           Cardiac and Carb modified diet DISCHARGE MEDICATION: Allergies as of 08/24/2021       Reactions   Amoxicillin Hives, Nausea  Only   Gabapentin Other (See Comments)   AMS Confusion   Mirtazapine Itching   Dry mouth with ODT Denies SOB with medication   Other    UNKNOWN PAIN MEDICATION - unknown reaction        Medication List     STOP taking these medications    HYDROcodone-acetaminophen 5-325 MG tablet Commonly known as: Norco   Verzenio 50 MG tablet Generic drug: abemaciclib       TAKE these medications    amLODipine 10 MG tablet Commonly known as: NORVASC Take 1 tablet (10 mg total) by mouth daily.   anastrozole 1 MG tablet Commonly known as: ARIMIDEX Take 1 mg by mouth daily.   aspirin EC 81 MG tablet Take 1 tablet (81 mg total) by mouth daily.   atorvastatin 40 MG tablet Commonly known as: LIPITOR TAKE 1 TABLET BY MOUTH  DAILY   BD Pen Needle Nano U/F 32G X 4 MM Misc Generic drug: Insulin Pen Needle USE 1 DAILY   blood glucose meter kit and supplies Kit Dispense based on patient and insurance preference. Use up to four times daily as directed. (FOR ICD-9 250.00, 250.01).   cloNIDine 0.1 MG tablet Commonly known as: CATAPRES Take 1 tablet (0.1 mg total) by mouth 2 (two) times daily.   clopidogrel 75 MG tablet Commonly known as: PLAVIX  TAKE 1 TABLET BY MOUTH  DAILY   Dulcolax 5 MG EC tablet Generic drug: bisacodyl Take 1 tablet (5 mg total) by mouth daily as needed for moderate constipation.   feeding supplement Liqd Take 237 mLs by mouth 2 (two) times daily between meals.   FreeStyle Libre 2 Reader [employer] Use to check blood sugar continuously   [employer] 2 Sensor Misc Apply 1 each topically every 14 (fourteen) days.   glucose blood test strip Commonly known as: ONE TOUCH TEST STRIPS Use to test blood sugar twice daily   hydrALAZINE 10 MG tablet Commonly known as: APRESOLINE Take 1 tablet (10 mg total) by mouth in the morning and at bedtime.   megestrol 625 MG/5ML suspension Commonly known as: MEGACE ES Take 5 mLs (625 mg total) by mouth daily.    Metoprolol Tartrate 37.5 MG Tabs Take 37.5 mg by mouth 2 (two) times daily.   nitroGLYCERIN 0.4 MG SL tablet Commonly known as: NITROSTAT Place 1 tablet (0.4 mg total) under the tongue every 5 (five) minutes as needed for chest pain.   ondansetron 4 MG tablet Commonly known as: Zofran Take 1 tablet (4 mg total) by mouth every 6 (six) hours as needed for nausea or vomiting.   onetouch ultrasoft lancets 1 each by Other route 2 (two) times daily. 90 day supply for [employer].  Dx E11.9   OneTouch Verio Reflect w/Device Kit 1 Device by Does not apply route daily.   polyethylene glycol 17 g packet Commonly known as: MIRALAX / GLYCOLAX Take 17 g by mouth daily. Start taking on: August 25, 2021   senna 8.6 MG Tabs tablet Commonly known as: SENOKOT Take 1 tablet (8.6 mg total) by mouth at bedtime.   TOUJEO SOLOSTAR [place] Inject 4 Units into the skin daily.   valsartan 160 MG tablet Commonly known as: Diovan Take 1 tablet (160 mg total) by mouth daily.   vitamin C 250 MG tablet Commonly known as: ASCORBIC ACID Take 250 mg by mouth daily.               Discharge Care Instructions  (From admission, onward)           Start     Ordered   08/24/21 0000  Discharge wound care:       Comments: Apply silicone foam dressing to the buttock wound. Change dressing every 3 days or as needed if soiled.   08/24/21 1249            Follow-up Information     [name], [name], FNP. Schedule an appointment as soon as possible for a visit in 1 week(s).   Specialty: Family Medicine Why: Hospital follow up Contact information: [address] [place] [address] [place] [address] [phone]         Billey Co, MD. Schedule an appointment as soon as possible for a visit in 1 week(s).   Specialty: Urology Why: Follow up in 1-2 weeks for Foley removal and voiding trial. Contact information: [place] [address] [phone]          [name], MD. Go to.   Specialty: Oncology Why: Follow up as scheduled. Contact information: [place] [address] [phone]                Discharge Exam: Danley Danker Weights   08/23/21 1153  Weight: 43.1 kg   General exam: awake, alert, no acute distress HEENT: moist mucus membranes, hearing  grossly normal  Respiratory system: CTAB, no wheezes, rales or rhonchi, normal respiratory effort. Cardiovascular system: normal S1/S2, RRR, no pedal edema.   Gastrointestinal system: soft, NT, ND, no HSM felt, +bowel sounds. Central nervous system: A&O x3. no gross focal neurologic deficits, normal speech Extremities: moves all, no edema, normal tone Skin: dry, intact, normal temperature Psychiatry: normal mood, congruent affect, judgement and insight appear normal   Condition at discharge: stable  The results of significant diagnostics from this hospitalization (including imaging, microbiology, ancillary and laboratory) are listed below for reference.   Imaging Studies: CT ABDOMEN PELVIS WO CONTRAST  Result Date: 08/23/2021 CLINICAL DATA:  Abdominal pain EXAM: CT ABDOMEN AND PELVIS WITHOUT CONTRAST TECHNIQUE: Multidetector CT imaging of the abdomen and pelvis was performed following the standard protocol without IV contrast. RADIATION DOSE REDUCTION: This exam was performed according to the departmental dose-optimization program which includes automated exposure control, adjustment of the mA and/or kV according to patient size and/or use of iterative reconstruction technique. COMPARISON:  07/06/2021 FINDINGS: Lower chest: Coronary artery calcifications are seen. There is small to moderate right pleural effusion with interval increase. Infiltrates in the posterior right lower lung fields may suggest compression atelectasis or pneumonia. There are linear densities in the right middle lobe, lingula and left lower lobe suggesting scarring or subsegmental atelectasis.  Metallic sutures seen in the sternum. Hepatobiliary: No focal abnormality is seen in the liver. Gallbladder is not distinctly seen. Pancreas: There is pancreatic atrophy. No focal abnormality is seen. Spleen: Spleen is small in size. Adrenals/Urinary Tract: Adrenals are unremarkable. There is no hydronephrosis. There are multiple calcific densities in both kidneys some of which appear to be calcification in the renal artery branches. This may be nonobstructing renal stones. There are few renal cysts largest in the upper pole of right kidney measuring 5.1 cm. Urinary bladder is distended. Stomach/Bowel: Small hiatal hernia is seen. Stomach is not distended. There is no significant small bowel dilation. Appendix is not distinctly seen. Large amount of stool is seen in ascending and transverse colon. There is no fecal impaction in the rectosigmoid. There is no dilation of descending and sigmoid colon. Vascular/Lymphatic: Extensive arterial calcifications are seen. Reproductive: Uterus is not seen. Other: There is no ascites or pneumoperitoneum. Small umbilical hernia containing fat is seen. Musculoskeletal: Unremarkable. IMPRESSION: Large amount of stool is seen in the ascending and transverse colon without evidence of fecal impaction in the rectosigmoid. There is no evidence intestinal obstruction or pneumoperitoneum. There is no hydronephrosis. Right renal cysts. There are multiple small calcifications in both kidneys suggesting nonobstructing renal stones and possibly calcifications in the renal artery branches. Small to moderate right pleural effusion. Infiltrates in the right lower lung fields may suggest atelectasis/pneumonia. Other findings as described in the body of the report. Electronically Signed   By: Elmer Picker M.D.   On: 08/23/2021 14:22   DG Abd 2 Views  Result Date: 08/17/2021 CLINICAL DATA:  Constipation for 3 weeks EXAM: ABDOMEN - 2 VIEW COMPARISON:  None. FINDINGS: Gas-filled,  nondistended small bowel and colon throughout the abdomen and pelvis, gas present to at least the descending colon. Moderate burden of stool. No free air in the abdomen. No radio-opaque calculi or other significant radiographic abnormality is seen. IMPRESSION: Gas-filled, nondistended small bowel and colon throughout the abdomen and pelvis, gas present to at least the descending colon. Moderate burden of stool. No free air in the abdomen. Electronically Signed   By: Delanna Ahmadi M.D.   On: 08/17/2021  15:52    Microbiology: Results for orders placed or performed during the hospital encounter of 08/23/21  Resp Panel by RT-PCR (Flu A&B, Covid) Nasopharyngeal Swab     Status: None   Collection Time: 08/24/21  6:55 AM   Specimen: Nasopharyngeal Swab; Nasopharyngeal(NP) swabs in vial transport medium  Result Value Ref Range Status   SARS Coronavirus 2 by RT PCR NEGATIVE NEGATIVE Final    Comment: (NOTE) SARS-CoV-2 target nucleic acids are NOT DETECTED.  The SARS-CoV-2 RNA is generally detectable in upper respiratory specimens during the acute phase of infection. The lowest concentration of SARS-CoV-2 viral copies this assay can detect is 138 copies/mL. A negative result does not preclude SARS-Cov-2 infection and should not be used as the sole basis for treatment or other patient management decisions. A negative result may occur with  improper specimen collection/handling, submission of specimen other than nasopharyngeal swab, presence of viral mutation(s) within the areas targeted by this assay, and inadequate number of viral copies(<138 copies/mL). A negative result must be combined with clinical observations, patient history, and epidemiological information. The expected result is Negative.  Fact Sheet for Patients:  EntrepreneurPulse.com.au  Fact Sheet for Healthcare Providers:  IncredibleEmployment.be  This test is no t yet approved or cleared by the  [place] FDA and  has been authorized for detection and/or diagnosis of SARS-CoV-2 by FDA under an Emergency Use Authorization (EUA). This EUA will remain  in effect (meaning this test can be used) for the duration of the COVID-19 declaration under Section 564(b)(1) of the Act, 21 U.S.C.section 360bbb-3(b)(1), unless the authorization is terminated  or revoked sooner.       Influenza A by PCR NEGATIVE NEGATIVE Final   Influenza B by PCR NEGATIVE NEGATIVE Final    Comment: (NOTE) The Xpert Xpress SARS-CoV-2/FLU/RSV plus assay is intended as an aid in the diagnosis of influenza from Nasopharyngeal swab specimens and should not be used as a sole basis for treatment. Nasal washings and aspirates are unacceptable for Xpert Xpress SARS-CoV-2/FLU/RSV testing.  Fact Sheet for Patients: EntrepreneurPulse.com.au  Fact Sheet for Healthcare Providers: IncredibleEmployment.be  This test is not yet approved or cleared by the [place] FDA and has been authorized for detection and/or diagnosis of SARS-CoV-2 by FDA under an Emergency Use Authorization (EUA). This EUA will remain in effect (meaning this test can be used) for the duration of the COVID-19 declaration under Section 564(b)(1) of the Act, 21 U.S.C. section 360bbb-3(b)(1), unless the authorization is terminated or revoked.  Performed at [facility], [address] [place], [place] [address]     Labs: CBC: Recent Labs  Lab 08/23/21 1027 08/23/21 2140  WBC 8.0  --   NEUTROABS 6.2  --   HGB 9.6* 10.7*  HCT 30.0* 32.0*  MCV 100.0  --   PLT 316  --    Basic Metabolic Panel: Recent Labs  Lab 08/23/21 1027 08/24/21 0412  NA 132* 135  K 4.7 4.7  CL 106 110  CO2 18* 19*  GLUCOSE 183* 139*  BUN 40* 33*  CREATININE 1.73* 1.49*  CALCIUM 8.5* 8.1*  MG 2.2  --    Liver Function Tests: Recent Labs  Lab 08/23/21 1027  AST 63*  ALT 24  ALKPHOS 59  BILITOT 0.8   PROT 6.1*  ALBUMIN 3.3*   CBG: Recent Labs  Lab 08/23/21 1744 08/23/21 2108 08/24/21 0830 08/24/21 1206  GLUCAP 168* 169* 102* 114*    Discharge time spent: greater than 30 minutes.  Signed: Ezekiel Slocumb, DO [facility] 08/24/2021

## 2021-08-24 NOTE — Assessment & Plan Note (Signed)
Metastatic.  Currently undergoing chemotherapy.  Followed by Dr. Tasia Catchings. ?

## 2021-08-24 NOTE — Assessment & Plan Note (Signed)
Per chart review, this appears to be a chronic issue with primary care mentioning urology referral but no record found of urology encounters. ? ?Patient has required multiple in/out catheterizations since admission with retained volumes as high as 1 L.   ?Discussed with urology.   ?Foley catheter placed today. ?Follow-up with urology in clinic in 1 to 2 weeks for catheter removal and voiding trial. ?

## 2021-08-24 NOTE — Assessment & Plan Note (Signed)
Improved with aggressive bowel regimen including enemas.  Continue on scheduled stool softeners with as needed laxatives.  Outpatient follow-up closely.  Ensure adequate hydration. ?

## 2021-08-24 NOTE — Assessment & Plan Note (Signed)
-   Continue home regimen 

## 2021-08-24 NOTE — Telephone Encounter (Signed)
Patient has been scheduled for a 1 week hospital follow up with provider on 08/31/2021. Patient is being released today, 08/24/2021 from hospital. ?

## 2021-08-24 NOTE — Assessment & Plan Note (Signed)
Present on admission.  I agree with the wound description as outlined below ?--Frequent repositioning ?-- Foam dressing to sacrum, change every 3 days or as needed for soiling ?-- Monitor closely for signs of infection or progression ?-- Home health RN to assist with wound care monitoring ? ?Pressure Injury 03/30/21 Sacrum Mid Stage 2 -  Partial thickness loss of dermis presenting as a shallow open injury with a red, pink wound bed without slough. Small pressure sore withs scab (Active)  ?03/30/21 2236  ?Location: Sacrum  ?Location Orientation: Mid  ?Staging: Stage 2 -  Partial thickness loss of dermis presenting as a shallow open injury with a red, pink wound bed without slough.  ?Wound Description (Comments): Small pressure sore withs scab  ?Present on Admission: Yes  ? ? ? ? ? ?

## 2021-08-24 NOTE — Evaluation (Signed)
Occupational Therapy Evaluation ?Patient Details ?Name: Tiffany Bailey ?MRN: [id number] ?DOB: November 21, 1927 ?Today's Date: 08/24/2021 ? ? ?History of Present Illness 88 y.o. female with medical history significant of intermittent constipation, metastatic breast cancer chemotherapy/immune therapy, refractory HTN, CKD stage IIIb, chronic anemia, IDDM, CAD status post CABG, PAD, presented with worsening of constipation.  ? ?Clinical Impression ?  ?Patient presenting with decreased Ind in self care,balance, functional mobility/transfers, endurance, and safety awareness. Patient reports living at home with son who works during the day. Pt has a person care attendant that comes 5x/wk for 2 hours to assist with IADLs and self care. Pt reports she does not wear L LE prosthetic and does not ambulate at baseline. She transfers self into wheelchair and propels self around home and into bathroom during the day. She reports being able to perform toileting needs when aide is not there by propelling wheelchair into bathroom and transferring. Pt currently performs bed mobility without physical assistance and transfers to recliner chair with supervision for squat pivot. Pt is likely close to baseline. OT does recommend need for more assist at home secondary to new foley and wound on buttocks.Pt has all needed equipment.  Patient will benefit from acute OT to increase overall independence in the areas of ADLs, functional mobility, and safety awareness in order to safely discharge home with family.  ?   ? ?Recommendations for follow up therapy are one component of a multi-disciplinary discharge planning process, led by the attending physician.  Recommendations may be updated based on patient status, additional functional criteria and insurance authorization.  ? ?Follow Up Recommendations ? Home health OT  ?  ?Assistance Recommended at Discharge Frequent or constant Supervision/Assistance  ?Patient can return home with the following A little  help with bathing/dressing/bathroom;Assistance with cooking/housework;Help with stairs or ramp for entrance;Assist for transportation;Direct supervision/assist for medications management;Direct supervision/assist for financial management ? ?  ?Functional Status Assessment ? Patient has had a recent decline in their functional status and demonstrates the ability to make significant improvements in function in a reasonable and predictable amount of time.  ?Equipment Recommendations ? None recommended by OT  ?  ?   ?Precautions / Restrictions Precautions ?Precautions: Fall ?Restrictions ?Weight Bearing Restrictions: No  ? ?  ? ?Mobility Bed Mobility ?Overal bed mobility: Modified Independent ?  ?  ?  ?  ?  ?  ?General bed mobility comments: increased time and effort but no physical assist ?  ? ?Transfers ?Overall transfer level: Needs assistance ?Equipment used: None ?Transfers: Bed to chair/wheelchair/BSC ?  ?  ?Squat pivot transfers: Supervision ?  ?  ?  ?General transfer comment: no physical assist ?  ? ?  ?Balance   ?Sitting-balance support: Feet supported ?Sitting balance-Leahy Scale: Good ?  ?  ?  ?  ?  ?  ?  ?  ?  ?  ?  ?  ?  ?  ?  ?  ?   ? ?ADL either performed or assessed with clinical judgement  ? ?ADL Overall ADL's : Needs assistance/impaired ?  ?  ?  ?  ?  ?  ?  ?  ?  ?  ?  ?  ?  ?  ?  ?  ?  ?  ?  ?General ADL Comments: close supervision for functional transfers. Set up A for grooming and UB self care.  Min A for LB self care at baseline.  ? ? ? ?Vision Patient Visual Report: No change from baseline ?   ?   ?   ?   ? ?  Pertinent Vitals/Pain Pain Assessment ?Pain Assessment: No/denies pain  ? ? ? ?Hand Dominance Right ?  ?Extremity/Trunk Assessment Upper Extremity Assessment ?Upper Extremity Assessment: Overall WFL for tasks assessed;Generalized weakness ?  ?Lower Extremity Assessment ?Lower Extremity Assessment: Overall WFL for tasks assessed;Generalized weakness ?  ?  ?  ?Communication  Communication ?Communication: No difficulties ?  ?Cognition Arousal/Alertness: Awake/alert ?Behavior During Therapy: [facility] for tasks assessed/performed ?Overall Cognitive Status: Within Functional Limits for tasks assessed ?  ?  ?  ?  ?  ?  ?  ?  ?  ?  ?  ?  ?  ?  ?  ?  ?  ?  ?  ?   ?   ?   ? ? ?Home Living Family/patient expects to be discharged to:: Private residence ?Living Arrangements: Children ?Available Help at Discharge: Family;Personal care attendant;Available PRN/intermittently (son is there at night) ?Type of Home: House ?Home Access: Ramped entrance ?  ?  ?Home Layout: One level ?  ?  ?Bathroom Shower/Tub: Tub/shower unit ?  ?Bathroom Toilet: Standard ?Bathroom Accessibility: Yes ?How Accessible: Accessible via wheelchair ?Home Equipment: Rolling Walker (2 wheels);BSC/3in1;Wheelchair - manual ?  ?Additional Comments: Has a PCA M-F for two hours in the morning; son works during the day and is then home during the evening/night ?  ? ?  ?Prior Functioning/Environment Prior Level of Function : Needs assist ?  ?  ?  ?  ?  ?  ?Mobility Comments: Pt reports prosthetic is ill fitting and she does not wear. Pt does not ambulate at baseline. Pt transfers herself into wheelchair and propels wheelchair around in home. ?ADLs Comments: Pt has PCA for 2 hrs to assist with self care and IADLs. Pt reports being able to perform toileting independently from wheelchair <> toilet during the day. ?  ? ?  ?  ?OT Problem List: Decreased strength;Decreased activity tolerance;Impaired balance (sitting and/or standing);Decreased safety awareness;Decreased knowledge of use of DME or AE ?  ?   ?OT Treatment/Interventions: Self-care/ADL training;Therapeutic exercise;DME and/or AE instruction;Balance training;Manual therapy;Patient/family education;Energy conservation;Therapeutic activities  ?  ?OT Goals(Current goals can be found in the care plan section) Acute Rehab OT Goals ?Patient Stated Goal: to go home ?OT Goal Formulation: With  patient ?Time For Goal Achievement: 09/07/21 ?Potential to Achieve Goals: Good ?ADL Goals ?Pt Will Perform Grooming: with modified independence ?Pt Will Perform Lower Body Dressing: with modified independence ?Pt Will Transfer to Toilet: with modified independence ?Pt Will Perform Toileting - Clothing Manipulation and hygiene: with modified independence  ?OT Frequency: Min 2X/week ?  ? ?   ?AM-PAC OT "6 Clicks" Daily Activity     ?Outcome Measure Help from another person eating meals?: None ?Help from another person taking care of personal grooming?: None ?Help from another person toileting, which includes using toliet, bedpan, or urinal?: A Little ?Help from another person bathing (including washing, rinsing, drying)?: A Little ?Help from another person to put on and taking off regular upper body clothing?: None ?Help from another person to put on and taking off regular lower body clothing?: A Little ?6 Click Score: 21 ?  ?End of Session Nurse Communication: Mobility status ? ?Activity Tolerance: Patient tolerated treatment well ?Patient left: in chair;with call bell/phone within reach ? ?OT Visit Diagnosis: Unsteadiness on feet (R26.81);Repeated falls (R29.6);Muscle weakness (generalized) (M62.81)  ?              ?Time: 3536-1443 ?OT Time Calculation (min): 21 min ?Charges:  OT General Charges ?$OT Visit:  1 Visit ?OT Evaluation ?$OT Eval Moderate Complexity: 1 Mod ?OT Treatments ?$Self Care/Home Management : 8-22 mins ? ?Darleen Crocker, MS, OTR/L , CBIS ?ascom [phone]  ?08/24/21, 1:39 PM  ?

## 2021-08-25 LAB — HEMOGLOBIN A1C
Hgb A1c MFr Bld: 8.5 % — ABNORMAL HIGH (ref 4.8–5.6)
Mean Plasma Glucose: 197 mg/dL

## 2021-08-27 ENCOUNTER — Telehealth: Payer: Self-pay

## 2021-08-27 ENCOUNTER — Ambulatory Visit (INDEPENDENT_AMBULATORY_CARE_PROVIDER_SITE_OTHER): Payer: Medicare Other

## 2021-08-27 DIAGNOSIS — Z0000 Encounter for general adult medical examination without abnormal findings: Secondary | ICD-10-CM

## 2021-08-27 NOTE — Telephone Encounter (Signed)
Transition Care Management Unsuccessful Follow-up Telephone Call ? ?Date of discharge and from where:  08/24/21 [facility] ? ?Attempts:  1st Attempt ? ?Reason for unsuccessful TCM follow-up call:  No answer/busy ? ?  ?

## 2021-08-27 NOTE — Patient Instructions (Signed)

## 2021-08-27 NOTE — Progress Notes (Signed)
Subjective:   Tiffany Bailey is a 88 y.o. female who presents for Medicare Annual (Subsequent) preventive examination. I connected with  [name] on 08/27/21 by a audio enabled telemedicine application and verified that I am speaking with the correct person using two identifiers.  Patient Location: Home  Provider Location: Home Office  I discussed the limitations of evaluation and management by telemedicine. The patient expressed understanding and agreed to proceed.   Review of Systems    Defer to PCP       Objective:    There were no vitals filed for this visit. There is no height or weight on file to calculate BMI.  Advanced Directives 08/23/2021 08/23/2021 08/16/2021 07/12/2021 05/24/2021 04/12/2021 04/03/2021  Does Patient Have a Medical Advance Directive? _0  No Yes  Type of Advance Directive - - - - - - Living will;Healthcare Power of Attorney  Does patient want to make changes to medical advance directive? - - - - - - Yes (ED - Information included in AVS)  Copy of [place] in Chart? - - - - - - -  Would patient like information on creating a medical advance directive? - - - - - - Yes (ED - Information included in AVS)    Current Medications (verified) Outpatient Encounter Medications as of 08/27/2021  Medication Sig   amLODipine (NORVASC) 10 MG tablet Take 1 tablet (10 mg total) by mouth daily.   anastrozole (ARIMIDEX) 1 MG tablet Take 1 mg by mouth daily.   aspirin EC 81 MG tablet Take 1 tablet (81 mg total) by mouth daily.   atorvastatin (LIPITOR) 40 MG tablet TAKE 1 TABLET BY MOUTH  DAILY   bisacodyl (DULCOLAX) 5 MG EC tablet Take 1 tablet (5 mg total) by mouth daily as needed for moderate constipation.   blood glucose meter kit and supplies KIT Dispense based on patient and insurance preference. Use up to four times daily as directed. (FOR ICD-9 250.00, 250.01).   Blood Glucose Monitoring Suppl (ONETOUCH VERIO REFLECT) w/Device KIT 1  Device by Does not apply route daily.   cloNIDine (CATAPRES) 0.1 MG tablet Take 1 tablet (0.1 mg total) by mouth 2 (two) times daily.   clopidogrel (PLAVIX) 75 MG tablet TAKE 1 TABLET BY MOUTH  DAILY   Continuous Blood Gluc Receiver (FREESTYLE LIBRE 2 READER) DEVI Use to check blood sugar continuously   Continuous Blood Gluc Sensor (FREESTYLE LIBRE 2 SENSOR) MISC Apply 1 each topically every 14 (fourteen) days.   feeding supplement (ENSURE ENLIVE / ENSURE PLUS) LIQD Take 237 mLs by mouth 2 (two) times daily between meals.   glucose blood (ONE TOUCH TEST STRIPS) test strip Use to test blood sugar twice daily   hydrALAZINE (APRESOLINE) 10 MG tablet Take 1 tablet (10 mg total) by mouth in the morning and at bedtime.   Insulin Glargine (TOUJEO SOLOSTAR [place]) Inject 4 Units into the skin daily.   Insulin Pen Needle (BD PEN NEEDLE NANO U/F) 32G X 4 MM MISC USE 1 DAILY   Lancets (ONETOUCH ULTRASOFT) lancets 1 each by Other route 2 (two) times daily. 90 day supply for [employer].  Dx E11.9   megestrol (MEGACE ES) 625 MG/5ML suspension Take 5 mLs (625 mg total) by mouth daily.   Metoprolol Tartrate 37.5 MG TABS Take 37.5 mg by mouth 2 (two) times daily.   nitroGLYCERIN (NITROSTAT) 0.4 MG SL tablet Place 1 tablet (0.4 mg total) under the tongue  every 5 (five) minutes as needed for chest pain.   ondansetron (ZOFRAN) 4 MG tablet Take 1 tablet (4 mg total) by mouth every 6 (six) hours as needed for nausea or vomiting.   polyethylene glycol (MIRALAX / GLYCOLAX) 17 g packet Take 17 g by mouth daily.   senna (SENOKOT) 8.6 MG TABS tablet Take 1 tablet (8.6 mg total) by mouth at bedtime.   valsartan (DIOVAN) 160 MG tablet Take 1 tablet (160 mg total) by mouth daily.   vitamin C (ASCORBIC ACID) 250 MG tablet Take 250 mg by mouth daily.   No facility-administered encounter medications on file as of 08/27/2021.    Allergies (verified) Amoxicillin, Gabapentin, Mirtazapine, and Other   History: Past  Medical History:  Diagnosis Date   Anemia    Anginal pain ([place])    Arthritis    Breast cancer ([place]) 07/19/2019   CKD (chronic kidney disease), stage III (HCC)    Coronary artery disease    a. 2002 CABG x 3 (LIMA-LAD, SVG-D1, SVG-RCA); b. s/p multiple PCI's.   Diabetes mellitus without complication ([place])    Diastolic dysfunction    a. 12/2017 Echo: EF 55-60%, no rwma, Gr1 DD. Triv AI. Mild-mod MR/TR. PASP 83mHg.   Edema    Failure to thrive in adult    Fecal impaction (HCC) 08/23/2021   GERD (gastroesophageal reflux disease)    Hb-SS disease without crisis ([place] 10/23/2017   History of breast cancer    HOH (hard of hearing)    Hyperlipidemia    Hypertension    Myocardial infarction ([facility]    PAD [place]ipheral artery disease) [place]    a. s/p L AKA; b. 12/2018 PTA R peroneal & PTA/Stenting R SFA.   Palpitations    Past Surgical History:  Procedure Laterality Date   ABDOMINAL HYSTERECTOMY     ABOVE KNEE LEG AMPUTATION Left 2013   ANTERIOR VITRECTOMY Left 11/16/2015   Procedure: ANTERIOR VITRECTOMY;  Surgeon: BEulogio Bear MD;  Location: [facility] ORS;  Service: Ophthalmology;  Laterality: Left;   BLADDER SURGERY     CATARACT EXTRACTION W/PHACO Left 11/16/2015   Procedure: CATARACT EXTRACTION PHACO AND INTRAOCULAR LENS PLACEMENT (IOC);  Surgeon: BEulogio Bear MD;  Location: [facility] ORS;  Service: Ophthalmology;  Laterality: Left;  Lot # [id number] [place] 01:22.8 AP%:12.6 CDE: 10.46   CATARACT EXTRACTION W/PHACO Right 08/22/2016   Procedure: CATARACT EXTRACTION PHACO AND INTRAOCULAR LENS PLACEMENT (IOC);  Surgeon: BEulogio Bear MD;  Location: [facility] ORS;  Service: Ophthalmology;  Laterality: Right;  Lot # [id number] [place] 00:52.1 AP%:8.7 CDE: 4.99    CORONARY ANGIOPLASTY     STENT   CORONARY ARTERY BYPASS GRAFT     LOWER EXTREMITY ANGIOGRAPHY Right 01/14/2019   Procedure: LOWER EXTREMITY ANGIOGRAPHY;  Surgeon: DAlgernon Huxley MD;  [facility];  Service: Cardiovascular;   Laterality: Right;   MASTECTOMY     TUMOR REMOVAL     ABDOMINAL   Family History  Problem Relation Age of Onset   Sudden death Mother    Arthritis Father    Stroke Father    Hypertension Sister    Hypertension Sister    Diabetes Grandchild    Social History   Socioeconomic History   Marital status: Divorced    Spouse name: Not on file   Number of children: 7   Years of education: Not on file   Highest education level: Not on file  Occupational History   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  Tobacco Use   Smoking status: Never   Smokeless tobacco: Never  Vaping Use   Vaping Use: Never used  Substance and Sexual Activity   Alcohol use: No   Drug use: No   Sexual activity: Not Currently  Other Topics Concern   Not on file  Social History Narrative   Not on file   Social Determinants of Health   Financial Resource Strain: Low Risk    Difficulty of Paying Living Expenses: Not hard at all  Food Insecurity: Not on file  Transportation Needs: Not on file  Physical Activity: Not on file  Stress: Not on file  Social Connections: Not on file    Tobacco Counseling Counseling given: Not Answered   Clinical Intake:                 Diabetic?Yes         Activities of Daily Living In your present state of health, do you have any difficulty performing the following activities: 08/23/2021  Hearing? N  Vision? N  Difficulty concentrating or making decisions? N  Walking or climbing stairs? Y  Dressing or bathing? Y  Doing errands, shopping? Y  Some recent data might be hidden    Patient Care Team: [name], FNP as PCP - General (Family Medicine) Jason Coop, NP as Nurse Practitioner (Hospice and Palliative Medicine) [name], MD as Consulting Physician (Oncology) Minna Merritts, MD as Consulting Physician (Cardiology) Lauretta Grill, NP as Nurse Practitioner (Nurse Practitioner) Borders, [name], NP as Nurse  Practitioner [facility] and Palliative Medicine)  Indicate any recent Medical Services you may have received from other than [facility] providers in the past year (date may be approximate).     Assessment:   This is a routine wellness examination for [place].  Hearing/Vision screen No results found.  Dietary issues and exercise activities discussed:     Goals Addressed   None   Depression Screen PHQ 2/9 Scores 12/15/2019 08/02/2019 01/20/2019 04/08/2017 03/25/2016  PHQ - 2 Score 0 0 0 0 0    Fall Risk Fall Risk  12/15/2019 08/02/2019 02/10/2019 02/10/2019 01/20/2019  Falls in the past year? 1 0 1 1 0  Number falls in past yr: 0 0 0 0 -  Injury with Fall? 1 0 - - -  Risk for fall due to : - Impaired mobility Impaired balance/gait;History of fall(s) History of fall(s);Impaired balance/gait -  Follow up Falls evaluation completed Falls evaluation completed - Falls evaluation completed -    FALL RISK PREVENTION PERTAINING TO THE HOME:  Any stairs in or around the home? No  If so, are there any without handrails?  N/A Home free of loose throw rugs in walkways, pet beds, electrical cords, etc? No  Adequate lighting in your home to reduce risk of falls? Yes   ASSISTIVE DEVICES UTILIZED TO PREVENT FALLS:  Life alert? No  Use of a cane, walker or w/c? Yes  Grab bars in the bathroom? Yes  Shower chair or bench in shower? No  Elevated toilet seat or a handicapped toilet? Yes   TIMED UP AND GO:  Was the test performed? No .  Length of time to ambulate 10 feet: N/A sec.     Cognitive Function:     6CIT Screen 01/20/2019  What Year? 0 points  What month? 0 points  What time? 0 points  Count back from 20 0 points  Months in reverse 0 points    Immunizations  Immunization History  Administered Date(s) Administered   Fluad Quad(high Dose 65+) 04/12/2019, 03/31/2021   Influenza Split 04/29/2013   Influenza, High Dose Seasonal PF 03/25/2016, 05/25/2018   Influenza,inj,Quad PF,6+ Mos  08/10/2014   Influenza-Unspecified 08/10/2014   Moderna Sars-Covid-2 Vaccination 07/23/2019, 08/20/2019   Pneumococcal Conjugate-13 09/29/2017   Pneumococcal Polysaccharide-23 03/09/2010, 09/11/2016    TDAP status: Due, Education has been provided regarding the importance of this vaccine. Advised may receive this vaccine at local pharmacy or Health Dept. Aware to provide a copy of the vaccination record if obtained from local pharmacy or Health Dept. Verbalized acceptance and understanding.  Flu Vaccine status: Up to date  Pneumococcal vaccine status: Up to date  Covid-19 vaccine status: Information provided on how to obtain vaccines.   Qualifies for Shingles Vaccine? Yes   Zostavax completed No   Shingrix Completed?: No.    Education has been provided regarding the importance of this vaccine. Patient has been advised to call insurance company to determine out of pocket expense if they have not yet received this vaccine. Advised may also receive vaccine at local pharmacy or Health Dept. Verbalized acceptance and understanding.  Screening Tests Health Maintenance  Topic Date Due   TETANUS/TDAP  Never done   Zoster Vaccines- Shingrix (1 of 2) Never done   FOOT EXAM  09/11/2017   COVID-19 Vaccine (3 - Moderna risk series) 09/17/2019   OPHTHALMOLOGY EXAM  11/18/2019   HEMOGLOBIN A1C  02/23/2022   Pneumonia Vaccine 60+ Years old  Completed   INFLUENZA VACCINE  Completed   DEXA SCAN  Completed   HPV VACCINES  Aged Out    Health Maintenance  Health Maintenance Due  Topic Date Due   TETANUS/TDAP  Never done   Zoster Vaccines- Shingrix (1 of 2) Never done   FOOT EXAM  09/11/2017   COVID-19 Vaccine (3 - Moderna risk series) 09/17/2019   OPHTHALMOLOGY EXAM  11/18/2019    Colorectal cancer screening: No longer required.  Per Patient preference  Mammogram status: No longer required due to Mastectomy.  Bone Density status: Completed 10/20/19. Results reflect: Bone density results:  OSTEOPOROSIS. Repeat every 2 years.  Lung Cancer Screening: (Low Dose CT Chest recommended if Age 33-80 years, 30 pack-year currently smoking OR have quit w/in 15years.) does not qualify.   Lung Cancer Screening Referral: N/A  Additional Screening:  Hepatitis C Screening: does not qualify; Completed N/A  Vision Screening: Recommended annual ophthalmology exams for early detection of glaucoma and other disorders of the eye. Is the patient up to date with their annual eye exam?  Yes  Who is the provider or what is the name of the office in which the patient attends annual eye exams? Dr Edison Pace, [facility] If pt is not established with a provider, would they like to be referred to a provider to establish care?  N/A .   Dental Screening: Recommended annual dental exams for proper oral hygiene  Community Resource Referral / Chronic Care Management: CRR required this visit?  No   CCM required this visit?  No      Plan:     I have personally reviewed and noted the following in the patients chart:   Medical and social history Use of alcohol, tobacco or illicit drugs  Current medications and supplements including opioid prescriptions.  Functional ability and status Nutritional status Physical activity Advanced directives List of other physicians Hospitalizations, surgeries, and ER visits in previous 12 months Vitals Screenings to include cognitive, depression, and falls  Referrals and appointments  In addition, I have reviewed and discussed with patient certain preventive protocols, quality metrics, and best practice recommendations. A written personalized care plan for preventive services as well as general preventive health recommendations were provided to patient.     Thressa Sheller, [place]   08/27/2021   Nurse Notes: Non-Face to Face 22 minute visit.   Ms. Hudock , Thank you for taking time to come for your Medicare Wellness Visit. I appreciate your ongoing commitment to  your health goals. Please review the following plan we discussed and let me know if I can assist you in the future.   These are the goals we discussed:  Goals       Follow up with Primary Care Provider (pt-stated)      As needed Continue leg strengthening exercises Wear ted hose        This is a list of the screening recommended for you and due dates:  Health Maintenance  Topic Date Due   Tetanus Vaccine  Never done   Zoster (Shingles) Vaccine (1 of 2) Never done   Complete foot exam   09/11/2017   COVID-19 Vaccine (3 - Moderna risk series) 09/17/2019   Eye exam for diabetics  11/18/2019   Hemoglobin A1C  02/23/2022   Pneumonia Vaccine  Completed   Flu Shot  Completed   DEXA scan (bone density measurement)  Completed   HPV Vaccine  Aged Out

## 2021-08-28 ENCOUNTER — Inpatient Hospital Stay: Payer: Medicare Other

## 2021-08-28 ENCOUNTER — Other Ambulatory Visit: Payer: Self-pay

## 2021-08-28 ENCOUNTER — Encounter: Payer: Self-pay | Admitting: Nurse Practitioner

## 2021-08-28 ENCOUNTER — Inpatient Hospital Stay (HOSPITAL_BASED_OUTPATIENT_CLINIC_OR_DEPARTMENT_OTHER): Payer: Medicare Other | Admitting: Nurse Practitioner

## 2021-08-28 VITALS — BP 156/74 | HR 56 | Temp 96.0°F | Wt 94.7 lb

## 2021-08-28 DIAGNOSIS — M1990 Unspecified osteoarthritis, unspecified site: Secondary | ICD-10-CM | POA: Diagnosis not present

## 2021-08-28 DIAGNOSIS — K5900 Constipation, unspecified: Secondary | ICD-10-CM | POA: Diagnosis not present

## 2021-08-28 DIAGNOSIS — E1151 Type 2 diabetes mellitus with diabetic peripheral angiopathy without gangrene: Secondary | ICD-10-CM | POA: Diagnosis not present

## 2021-08-28 DIAGNOSIS — E871 Hypo-osmolality and hyponatremia: Secondary | ICD-10-CM | POA: Diagnosis not present

## 2021-08-28 DIAGNOSIS — M810 Age-related osteoporosis without current pathological fracture: Secondary | ICD-10-CM | POA: Diagnosis not present

## 2021-08-28 DIAGNOSIS — E785 Hyperlipidemia, unspecified: Secondary | ICD-10-CM | POA: Diagnosis not present

## 2021-08-28 DIAGNOSIS — C50919 Malignant neoplasm of unspecified site of unspecified female breast: Secondary | ICD-10-CM

## 2021-08-28 DIAGNOSIS — Z794 Long term (current) use of insulin: Secondary | ICD-10-CM | POA: Diagnosis not present

## 2021-08-28 DIAGNOSIS — Z9013 Acquired absence of bilateral breasts and nipples: Secondary | ICD-10-CM | POA: Diagnosis not present

## 2021-08-28 DIAGNOSIS — H409 Unspecified glaucoma: Secondary | ICD-10-CM | POA: Diagnosis not present

## 2021-08-28 DIAGNOSIS — Z466 Encounter for fitting and adjustment of urinary device: Secondary | ICD-10-CM | POA: Diagnosis not present

## 2021-08-28 DIAGNOSIS — J90 Pleural effusion, not elsewhere classified: Secondary | ICD-10-CM | POA: Diagnosis not present

## 2021-08-28 DIAGNOSIS — R339 Retention of urine, unspecified: Secondary | ICD-10-CM | POA: Diagnosis not present

## 2021-08-28 DIAGNOSIS — Z951 Presence of aortocoronary bypass graft: Secondary | ICD-10-CM | POA: Diagnosis not present

## 2021-08-28 DIAGNOSIS — E1122 Type 2 diabetes mellitus with diabetic chronic kidney disease: Secondary | ICD-10-CM | POA: Diagnosis not present

## 2021-08-28 DIAGNOSIS — D6481 Anemia due to antineoplastic chemotherapy: Secondary | ICD-10-CM | POA: Diagnosis not present

## 2021-08-28 DIAGNOSIS — E1140 Type 2 diabetes mellitus with diabetic neuropathy, unspecified: Secondary | ICD-10-CM | POA: Diagnosis not present

## 2021-08-28 DIAGNOSIS — E860 Dehydration: Secondary | ICD-10-CM

## 2021-08-28 DIAGNOSIS — E119 Type 2 diabetes mellitus without complications: Secondary | ICD-10-CM | POA: Diagnosis not present

## 2021-08-28 DIAGNOSIS — T451X5D Adverse effect of antineoplastic and immunosuppressive drugs, subsequent encounter: Secondary | ICD-10-CM | POA: Diagnosis not present

## 2021-08-28 DIAGNOSIS — D631 Anemia in chronic kidney disease: Secondary | ICD-10-CM | POA: Diagnosis not present

## 2021-08-28 DIAGNOSIS — N1831 Chronic kidney disease, stage 3a: Secondary | ICD-10-CM

## 2021-08-28 DIAGNOSIS — R636 Underweight: Secondary | ICD-10-CM | POA: Diagnosis not present

## 2021-08-28 DIAGNOSIS — I252 Old myocardial infarction: Secondary | ICD-10-CM | POA: Diagnosis not present

## 2021-08-28 DIAGNOSIS — L89152 Pressure ulcer of sacral region, stage 2: Secondary | ICD-10-CM | POA: Diagnosis not present

## 2021-08-28 DIAGNOSIS — Z89612 Acquired absence of left leg above knee: Secondary | ICD-10-CM | POA: Diagnosis not present

## 2021-08-28 DIAGNOSIS — M8580 Other specified disorders of bone density and structure, unspecified site: Secondary | ICD-10-CM | POA: Diagnosis not present

## 2021-08-28 DIAGNOSIS — K219 Gastro-esophageal reflux disease without esophagitis: Secondary | ICD-10-CM | POA: Diagnosis not present

## 2021-08-28 DIAGNOSIS — Z9181 History of falling: Secondary | ICD-10-CM | POA: Diagnosis not present

## 2021-08-28 DIAGNOSIS — F32A Depression, unspecified: Secondary | ICD-10-CM | POA: Diagnosis not present

## 2021-08-28 DIAGNOSIS — I2510 Atherosclerotic heart disease of native coronary artery without angina pectoris: Secondary | ICD-10-CM | POA: Diagnosis not present

## 2021-08-28 DIAGNOSIS — I129 Hypertensive chronic kidney disease with stage 1 through stage 4 chronic kidney disease, or unspecified chronic kidney disease: Secondary | ICD-10-CM | POA: Diagnosis not present

## 2021-08-28 DIAGNOSIS — E46 Unspecified protein-calorie malnutrition: Secondary | ICD-10-CM | POA: Diagnosis not present

## 2021-08-28 DIAGNOSIS — Z803 Family history of malignant neoplasm of breast: Secondary | ICD-10-CM | POA: Diagnosis not present

## 2021-08-28 LAB — CBC WITH DIFFERENTIAL/PLATELET
Abs Immature Granulocytes: 0.15 10*3/uL — ABNORMAL HIGH (ref 0.00–0.07)
Basophils Absolute: 0.1 10*3/uL (ref 0.0–0.1)
Basophils Relative: 1 %
Eosinophils Absolute: 0.3 10*3/uL (ref 0.0–0.5)
Eosinophils Relative: 2 %
HCT: 30.9 % — ABNORMAL LOW (ref 36.0–46.0)
Hemoglobin: 10.1 g/dL — ABNORMAL LOW (ref 12.0–15.0)
Immature Granulocytes: 1 %
Lymphocytes Relative: 13 %
Lymphs Abs: 1.5 10*3/uL (ref 0.7–4.0)
MCH: 31.6 pg (ref 26.0–34.0)
MCHC: 32.7 g/dL (ref 30.0–36.0)
MCV: 96.6 fL (ref 80.0–100.0)
Monocytes Absolute: 0.7 10*3/uL (ref 0.1–1.0)
Monocytes Relative: 6 %
Neutro Abs: 9.2 10*3/uL — ABNORMAL HIGH (ref 1.7–7.7)
Neutrophils Relative %: 77 %
Platelets: 285 10*3/uL (ref 150–400)
RBC: 3.2 MIL/uL — ABNORMAL LOW (ref 3.87–5.11)
RDW: 14.1 % (ref 11.5–15.5)
WBC: 11.9 10*3/uL — ABNORMAL HIGH (ref 4.0–10.5)
nRBC: 0 % (ref 0.0–0.2)

## 2021-08-28 LAB — COMPREHENSIVE METABOLIC PANEL
ALT: 70 U/L — ABNORMAL HIGH (ref 0–44)
AST: 150 U/L — ABNORMAL HIGH (ref 15–41)
Albumin: 3.5 g/dL (ref 3.5–5.0)
Alkaline Phosphatase: 69 U/L (ref 38–126)
Anion gap: 9 (ref 5–15)
BUN: 30 mg/dL — ABNORMAL HIGH (ref 8–23)
CO2: 18 mmol/L — ABNORMAL LOW (ref 22–32)
Calcium: 9.1 mg/dL (ref 8.9–10.3)
Chloride: 106 mmol/L (ref 98–111)
Creatinine, Ser: 1.53 mg/dL — ABNORMAL HIGH (ref 0.44–1.00)
GFR, Estimated: 32 mL/min — ABNORMAL LOW (ref >60)
Glucose, Bld: 142 mg/dL — ABNORMAL HIGH (ref 70–99)
Potassium: 4.8 mmol/L (ref 3.5–5.1)
Sodium: 133 mmol/L — ABNORMAL LOW (ref 135–145)
Total Bilirubin: 0.9 mg/dL (ref 0.3–1.2)
Total Protein: 6.7 g/dL (ref 6.5–8.1)

## 2021-08-28 LAB — MAGNESIUM: Magnesium: 2 mg/dL (ref 1.7–2.4)

## 2021-08-28 MED ORDER — SODIUM CHLORIDE 0.9 % IV SOLN
Freq: Once | INTRAVENOUS | Status: AC
  Filled 2021-08-28: qty 250

## 2021-08-28 NOTE — Progress Notes (Signed)
?Hematology/Oncology Progress Note ?[phone] [phone] ?  ?Patient Care Team: ?[name], FNP as PCP - General (Family Medicine) ?Jason Coop, NP as Nurse Practitioner [facility] and Palliative Medicine) ?[name], MD as Consulting Physician (Oncology) ?Minna Merritts, MD as Consulting Physician (Cardiology) ?Lauretta Grill, NP as Nurse Practitioner (Nurse Practitioner) ?Borders, [name], NP as Nurse Practitioner [facility] and Palliative Medicine) ? ?REFERRING PROVIDER: ?[name], FNP  ? ?CHIEF COMPLAINTS/REASON FOR VISIT:  ?Follow up for metastatic breast cancer ? ?HISTORY OF PRESENTING ILLNESS: Tiffany Bailey is a  88 y.o.  female with PMH listed below was seen in consultation at the request of  [name], FNP  for evaluation of abnormal CT scan. ?Patient was recently evaluated by primary care provider for complaints of urinary retention, headache, constipation. ?02/03/2019 x-ray of abdomen showed large colonic stool volume, nodular densities noted in the right mid lung are up). ?02/08/2019 subsequent chest x-ray two-view showed hyperinflation of the lungs compatible with COPD.  Multiple nodular densities project over the mid and lower right lung. ?CT chest was obtained for further evaluation. ?02/17/2019 CT chest without contrast Showed multiple bilateral pulmonary nodules,groundglass opacities. ?Largest solid nodule at the confluence of the right major and minor fissures measuring 1.6 cm.  Most notable groundglass nodule is of the right upper lobe and measure 1.8 x 0.8 cm, small right pleural effusion with associated atelectasis or consolidation.  Larger solid nodule are highly suspicious for metastatic disease. ?Bulky right mediastinal and hilar lymph node.  Largest pretracheal nodes measuring 3.5 x 3 cm.  There are additional bulky superior mediastinal, supraclavicular, left subpectoral lymph nodes.  Largest subpectoral nodes measuring 3.3 x 2.5  cm.  ?Patient was sent to cancer center for further evaluation. ? ?#Patient reports a history of left breast cancer status post mastectomy.  She is a poor historian.  She is not able to recall the timeframe of breast cancer diagnosis and then treatments.  Denies any chemotherapy or radiation treatments. ? ?#Left lower extremity history of amputation, CKD, History of MI, Sickle cell disease, DM, HTN, anemia.  ? ?Patient denies shortness of breath, chest pain, hemoptysis.  Night sweating, abdominal pain. ?Reports intentional weight loss.  Appetite is poor.  Not eating much. ?Also have wax and wane, chronic intermittent headache, she takes Tylenol as needed, with some relief.  Denies any nausea vomiting. She sees neurology. 11/10/2018 CT head showed no acute intracranial abnormality or significant interval changes.  Stable atrophy and white matter disease.  Atherosclerosis. ? ?#Patient had a consultation visit with me on 02/19/2019.  At that time he was referred for abnormal CT scan.  PET scan was obtained and I recommend patient to obtain left axillary lymph node biopsy.  Patient/family called back and decided not to proceed with any additional work-up. ? ?#February 2020 started Arimidex ?# 03/30/2021 - 03/31/2021 Admission due to AKI, felt to be due to dehydration, side effects of Jardiance.  ?Her glucose has been low and diabetes regimen has been adjusted. [name] was held.  ? ?INTERVAL HISTORY ?Tiffany Bailey is a 88 y.o. female with metastatic breast cancer, currently on fulvestrant who returns to clinic for follow up after recent hospitalization for bowel obstruction/constipation.With medical management, she was able to move her bowels. Has had one BM at home since hospitalization. Continues to feel weak and tired but abdominal pain and distention has resolved.  ? ?Review of Systems  ?Constitutional:  Positive for fatigue. Negative for appetite change, chills, fever  and unexpected weight change.  ?HENT:   Negative  for hearing loss and voice change.   ?Eyes:  Negative for eye problems.  ?Respiratory:  Negative for chest tightness, cough and shortness of breath.   ?Cardiovascular:  Negative for chest pain.  ?Gastrointestinal:  Negative for abdominal distention, abdominal pain, blood in stool, constipation, diarrhea, nausea, rectal pain and vomiting.  ?Endocrine: Negative for hot flashes.  ?Genitourinary:  Negative for difficulty urinating, frequency, pelvic pain and vaginal bleeding.   ?Musculoskeletal:  Negative for arthralgias and flank pain.  ?     Left lower extremity history of amputation  ?Skin:  Negative for itching and rash.  ?Neurological:  Negative for extremity weakness and headaches.  ?Hematological:  Negative for adenopathy.  ?Psychiatric/Behavioral:  Negative for confusion, depression and sleep disturbance. The patient is not nervous/anxious.   ? ?MEDICAL HISTORY:  ?Past Medical History:  ?Diagnosis Date  ? Anemia   ? Anginal pain ([place])   ? Arthritis   ? Breast cancer ([place]) 07/19/2019  ? CKD (chronic kidney disease), stage III ([place])   ? Coronary artery disease   ? a. 2002 CABG x 3 (LIMA-LAD, SVG-D1, SVG-RCA); b. s/p multiple PCI's.  ? Diabetes mellitus without complication ([place])   ? Diastolic dysfunction   ? a. 12/2017 Echo: EF 55-60%, no rwma, Gr1 DD. Triv AI. Mild-mod MR/TR. PASP 69mHg.  ? Edema   ? Failure to thrive in adult   ? Fecal impaction ([place] 08/23/2021  ? GERD (gastroesophageal reflux disease)   ? Hb-SS disease without crisis ([place] 10/23/2017  ? History of breast cancer   ? HOH (hard of hearing)   ? Hyperlipidemia   ? Hypertension   ? Myocardial infarction ([facility]   ? PAD (peripheral artery disease) ([place]   ? a. s/p L AKA; b. 12/2018 PTA R peroneal & PTA/Stenting R SFA.  ? Palpitations   ? ? ?SURGICAL HISTORY: ?Past Surgical History:  ?Procedure Laterality Date  ? ABDOMINAL HYSTERECTOMY    ? ABOVE KNEE LEG AMPUTATION Left 2013  ? ANTERIOR VITRECTOMY Left 11/16/2015  ? Procedure: ANTERIOR VITRECTOMY;  Surgeon:  BEulogio Bear MD;  Location: [facility] ORS;  Service: Ophthalmology;  Laterality: Left;  ? BLADDER SURGERY    ? CATARACT EXTRACTION W/PHACO Left 11/16/2015  ? Procedure: CATARACT EXTRACTION PHACO AND INTRAOCULAR LENS PLACEMENT (IOC);  Surgeon: BEulogio Bear MD;  Location: [facility] ORS;  Service: Ophthalmology;  Laterality: Left;  Lot # [id number] ?[place] 01:22.8 ?AP%:12.6 ?CDE: 10.46  ? CATARACT EXTRACTION W/PHACO Right 08/22/2016  ? Procedure: CATARACT EXTRACTION PHACO AND INTRAOCULAR LENS PLACEMENT (IOC);  Surgeon: BEulogio Bear MD;  Location: [facility] ORS;  Service: Ophthalmology;  Laterality: Right;  Lot # [id number] ?[place] 00:52.1 ?AP%:8.7 ?CDE: 4.99 ?  ? CORONARY ANGIOPLASTY    ? STENT  ? CORONARY ARTERY BYPASS GRAFT    ? LOWER EXTREMITY ANGIOGRAPHY Right 01/14/2019  ? Procedure: LOWER EXTREMITY ANGIOGRAPHY;  Surgeon: DAlgernon Huxley MD;  [facility];  Service: Cardiovascular;  Laterality: Right;  ? MASTECTOMY    ? TUMOR REMOVAL    ? ABDOMINAL  ? ? ?SOCIAL HISTORY: ?Social History  ? ?Socioeconomic History  ? Marital status: Divorced  ?  Spouse name: Not on file  ? Number of children: 7  ? Years of education: Not on file  ? Highest education level: Not on file  ?Occupational History  ? Occupation: retired  ?  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 Smoking status: Never  ?  Smokeless tobacco: Never  ?Vaping Use  ? Vaping Use: Never used  ?Substance and Sexual Activity  ? Alcohol use: No  ? Drug use: No  ? Sexual activity: Not Currently  ?Other Topics Concern  ? Not on file  ?Social History Narrative  ? Not on file  ? ?Social Determinants of Health  ? ?Financial Resource Strain: Low Risk   ? Difficulty of Paying Living Expenses: Not hard at all  ?Food Insecurity: No Food Insecurity  ? Worried About Charity fundraiser in the Last Year: Never true  ? Ran Out of Food in the Last Year: Never true  ?Transportation Needs: No Transportation Needs  ? Lack of Transportation (Medical): No  ? Lack of Transportation  (Non-Medical): No  ?Physical Activity: Insufficiently Active  ? Days of Exercise per Week: 7 days  ? Minutes of Exercise per Session: 10 min  ?Stress: No Stress Concern Present  ? Feeling of Stress : Not

## 2021-08-28 NOTE — Progress Notes (Signed)
Patient here for follow up. Patient complains of being Patient complains of pain in both legs.   ?

## 2021-08-28 NOTE — Telephone Encounter (Signed)
Transition Care Management Follow-up Telephone Call ?Date of discharge and from where: 08/24/21 [facility].  ?Information received from daughter, HIPAA compliant. ?How have you been since you were released from the hospital? Difficulty ambulating with walker, too weak/tired to transfer from one position to another. Taking miralax as directed, minimal BM. Denies seeing blood in stool, N/V/D, pain, headache or any other alarming symptoms. Some confusion. Staying hydrated with Coke only, will not drink water.  Nurse recommends adding ice to coke to help dilute, add low sodium sugar free gatorade/pedialyte to assist with hydration.  Appetite okay. Catheter in place. Appointment with Urology scheduled. Seen by Oncology today. Monitoring BS, notes wnl.   ?Any questions or concerns? No ? ?Items Reviewed: ?Did the pt receive and understand the discharge instructions provided? Yes  ?Medications obtained and verified? Yes , daughter [name] manages.  ?Any new allergies since your discharge? No  ?Dietary orders reviewed? Yes ?Do you have support at home? Yes  ? ?Home Care and Equipment/Supplies: ?Were home health services ordered? Yes, awaiting schedule. ? ?Functional Questionnaire: (I = Independent and D = Dependent) ?ADLs: Family assist ? ?Maintaining continence- Independent with catheter in place.  ? ?Transferring/Ambulation- Walker when not too tired to use.  ? ?Managing Meds- Daughter manages. No further questions or concerns.  ? ?Follow up appointments reviewed: ? ?PCP Hospital f/u appt confirmed? Yes  Scheduled to see PCP on 08/31/21 @ 11:30. ?Are transportation arrangements needed? No  ?If their condition worsens, is the pt aware to call PCP or go to the Emergency Dept.? Yes ?Was the patient provided with contact information for the PCP's office or ED? Yes ?Was to pt encouraged to call back with questions or concerns? Yes  ?

## 2021-08-31 ENCOUNTER — Emergency Department: Payer: Medicare Other

## 2021-08-31 ENCOUNTER — Inpatient Hospital Stay
Admission: EM | Admit: 2021-08-31 | Discharge: 2021-09-02 | DRG: 700 | Disposition: A | Discharge status: Home Health | Payer: Medicare Other | Source: Ambulatory Visit | Attending: Internal Medicine | Admitting: Internal Medicine

## 2021-08-31 ENCOUNTER — Encounter: Payer: Self-pay | Admitting: Family

## 2021-08-31 ENCOUNTER — Encounter: Payer: Self-pay | Admitting: Oncology

## 2021-08-31 ENCOUNTER — Ambulatory Visit (INDEPENDENT_AMBULATORY_CARE_PROVIDER_SITE_OTHER): Payer: Medicare Other | Admitting: Family

## 2021-08-31 ENCOUNTER — Other Ambulatory Visit: Payer: Self-pay

## 2021-08-31 DIAGNOSIS — Z79899 Other long term (current) drug therapy: Secondary | ICD-10-CM

## 2021-08-31 DIAGNOSIS — R6251 Failure to thrive (child): Secondary | ICD-10-CM

## 2021-08-31 DIAGNOSIS — N183 Chronic kidney disease, stage 3 unspecified: Secondary | ICD-10-CM | POA: Diagnosis not present

## 2021-08-31 DIAGNOSIS — I739 Peripheral vascular disease, unspecified: Secondary | ICD-10-CM | POA: Diagnosis present

## 2021-08-31 DIAGNOSIS — N390 Urinary tract infection, site not specified: Secondary | ICD-10-CM | POA: Diagnosis present

## 2021-08-31 DIAGNOSIS — I252 Old myocardial infarction: Secondary | ICD-10-CM | POA: Diagnosis not present

## 2021-08-31 DIAGNOSIS — Z853 Personal history of malignant neoplasm of breast: Secondary | ICD-10-CM

## 2021-08-31 DIAGNOSIS — Z823 Family history of stroke: Secondary | ICD-10-CM

## 2021-08-31 DIAGNOSIS — D571 Sickle-cell disease without crisis: Secondary | ICD-10-CM | POA: Diagnosis present

## 2021-08-31 DIAGNOSIS — J90 Pleural effusion, not elsewhere classified: Secondary | ICD-10-CM | POA: Diagnosis not present

## 2021-08-31 DIAGNOSIS — Z951 Presence of aortocoronary bypass graft: Secondary | ICD-10-CM | POA: Diagnosis not present

## 2021-08-31 DIAGNOSIS — I2510 Atherosclerotic heart disease of native coronary artery without angina pectoris: Secondary | ICD-10-CM | POA: Diagnosis present

## 2021-08-31 DIAGNOSIS — E119 Type 2 diabetes mellitus without complications: Secondary | ICD-10-CM

## 2021-08-31 DIAGNOSIS — R5383 Other fatigue: Secondary | ICD-10-CM

## 2021-08-31 DIAGNOSIS — R531 Weakness: Secondary | ICD-10-CM | POA: Diagnosis not present

## 2021-08-31 DIAGNOSIS — N3000 Acute cystitis without hematuria: Secondary | ICD-10-CM | POA: Diagnosis not present

## 2021-08-31 DIAGNOSIS — E1151 Type 2 diabetes mellitus with diabetic peripheral angiopathy without gangrene: Secondary | ICD-10-CM | POA: Diagnosis present

## 2021-08-31 DIAGNOSIS — E1159 Type 2 diabetes mellitus with other circulatory complications: Secondary | ICD-10-CM

## 2021-08-31 DIAGNOSIS — I129 Hypertensive chronic kidney disease with stage 1 through stage 4 chronic kidney disease, or unspecified chronic kidney disease: Secondary | ICD-10-CM | POA: Diagnosis present

## 2021-08-31 DIAGNOSIS — Z7982 Long term (current) use of aspirin: Secondary | ICD-10-CM

## 2021-08-31 DIAGNOSIS — Z833 Family history of diabetes mellitus: Secondary | ICD-10-CM | POA: Diagnosis not present

## 2021-08-31 DIAGNOSIS — T83511A Infection and inflammatory reaction due to indwelling urethral catheter, initial encounter: Secondary | ICD-10-CM | POA: Diagnosis not present

## 2021-08-31 DIAGNOSIS — R9431 Abnormal electrocardiogram [ECG] [EKG]: Secondary | ICD-10-CM | POA: Diagnosis not present

## 2021-08-31 DIAGNOSIS — Z794 Long term (current) use of insulin: Secondary | ICD-10-CM

## 2021-08-31 DIAGNOSIS — E1122 Type 2 diabetes mellitus with diabetic chronic kidney disease: Secondary | ICD-10-CM | POA: Diagnosis present

## 2021-08-31 DIAGNOSIS — E785 Hyperlipidemia, unspecified: Secondary | ICD-10-CM | POA: Diagnosis not present

## 2021-08-31 DIAGNOSIS — R6889 Other general symptoms and signs: Secondary | ICD-10-CM | POA: Diagnosis not present

## 2021-08-31 DIAGNOSIS — T699XXA Effect of reduced temperature, unspecified, initial encounter: Secondary | ICD-10-CM | POA: Diagnosis not present

## 2021-08-31 DIAGNOSIS — Z8249 Family history of ischemic heart disease and other diseases of the circulatory system: Secondary | ICD-10-CM

## 2021-08-31 DIAGNOSIS — R627 Adult failure to thrive: Secondary | ICD-10-CM | POA: Diagnosis present

## 2021-08-31 DIAGNOSIS — R339 Retention of urine, unspecified: Secondary | ICD-10-CM | POA: Diagnosis present

## 2021-08-31 DIAGNOSIS — I10 Essential (primary) hypertension: Secondary | ICD-10-CM

## 2021-08-31 DIAGNOSIS — Y846 Urinary catheterization as the cause of abnormal reaction of the patient, or of later complication, without mention of misadventure at the time of the procedure: Secondary | ICD-10-CM | POA: Diagnosis present

## 2021-08-31 DIAGNOSIS — R5381 Other malaise: Secondary | ICD-10-CM

## 2021-08-31 DIAGNOSIS — L89152 Pressure ulcer of sacral region, stage 2: Secondary | ICD-10-CM | POA: Diagnosis not present

## 2021-08-31 DIAGNOSIS — Z79811 Long term (current) use of aromatase inhibitors: Secondary | ICD-10-CM | POA: Diagnosis not present

## 2021-08-31 DIAGNOSIS — I25118 Atherosclerotic heart disease of native coronary artery with other forms of angina pectoris: Secondary | ICD-10-CM | POA: Diagnosis not present

## 2021-08-31 DIAGNOSIS — Z8261 Family history of arthritis: Secondary | ICD-10-CM

## 2021-08-31 DIAGNOSIS — Z66 Do not resuscitate: Secondary | ICD-10-CM | POA: Diagnosis not present

## 2021-08-31 DIAGNOSIS — Z743 Need for continuous supervision: Secondary | ICD-10-CM | POA: Diagnosis not present

## 2021-08-31 DIAGNOSIS — Z89612 Acquired absence of left leg above knee: Secondary | ICD-10-CM | POA: Diagnosis not present

## 2021-08-31 DIAGNOSIS — I672 Cerebral atherosclerosis: Secondary | ICD-10-CM | POA: Diagnosis not present

## 2021-08-31 DIAGNOSIS — Z955 Presence of coronary angioplasty implant and graft: Secondary | ICD-10-CM | POA: Diagnosis not present

## 2021-08-31 LAB — CBC
HCT: 27.5 % — ABNORMAL LOW (ref 36.0–46.0)
Hemoglobin: 8.6 g/dL — ABNORMAL LOW (ref 12.0–15.0)
MCH: 31.3 pg (ref 26.0–34.0)
MCHC: 31.3 g/dL (ref 30.0–36.0)
MCV: 100 fL (ref 80.0–100.0)
Platelets: 252 10*3/uL (ref 150–400)
RBC: 2.75 MIL/uL — ABNORMAL LOW (ref 3.87–5.11)
RDW: 14.4 % (ref 11.5–15.5)
WBC: 12.1 10*3/uL — ABNORMAL HIGH (ref 4.0–10.5)
nRBC: 0 % (ref 0.0–0.2)

## 2021-08-31 LAB — URINALYSIS, ROUTINE W REFLEX MICROSCOPIC
Bilirubin Urine: NEGATIVE
Glucose, UA: NEGATIVE mg/dL
Ketones, ur: NEGATIVE mg/dL
Nitrite: NEGATIVE
Protein, ur: 100 mg/dL — AB
Specific Gravity, Urine: 1.015 (ref 1.005–1.030)
Squamous Epithelial / HPF: NONE SEEN (ref 0–5)
WBC, UA: >50 WBC/hpf — ABNORMAL HIGH (ref 0–5)
pH: 5 (ref 5.0–8.0)

## 2021-08-31 LAB — BASIC METABOLIC PANEL
Anion gap: 8 (ref 5–15)
BUN: 34 mg/dL — ABNORMAL HIGH (ref 8–23)
CO2: 18 mmol/L — ABNORMAL LOW (ref 22–32)
Calcium: 8.5 mg/dL — ABNORMAL LOW (ref 8.9–10.3)
Chloride: 106 mmol/L (ref 98–111)
Creatinine, Ser: 1.42 mg/dL — ABNORMAL HIGH (ref 0.44–1.00)
GFR, Estimated: 35 mL/min — ABNORMAL LOW (ref >60)
Glucose, Bld: 143 mg/dL — ABNORMAL HIGH (ref 70–99)
Potassium: 5 mmol/L (ref 3.5–5.1)
Sodium: 132 mmol/L — ABNORMAL LOW (ref 135–145)

## 2021-08-31 LAB — GLUCOSE, CAPILLARY: Glucose-Capillary: 107 mg/dL — ABNORMAL HIGH (ref 70–99)

## 2021-08-31 MED ORDER — ASPIRIN EC 81 MG PO TBEC
81.0000 mg | DELAYED_RELEASE_TABLET | Freq: Every day | ORAL | Status: DC
  Administered 2021-09-01 – 2021-09-02 (×2): 81 mg via ORAL
  Filled 2021-08-31 (×2): qty 1

## 2021-08-31 MED ORDER — LEVOFLOXACIN IN D5W 750 MG/150ML IV SOLN: 750.0000 mg | Freq: Once | INTRAVENOUS | Status: DC

## 2021-08-31 MED ORDER — POLYETHYLENE GLYCOL 3350 17 G PO PACK
17.0000 g | PACK | Freq: Every day | ORAL | Status: DC
  Filled 2021-08-31: qty 1

## 2021-08-31 MED ORDER — CLOPIDOGREL BISULFATE 75 MG PO TABS
75.0000 mg | ORAL_TABLET | Freq: Every day | ORAL | Status: DC
  Administered 2021-09-01 – 2021-09-02 (×2): 75 mg via ORAL
  Filled 2021-08-31 (×2): qty 1

## 2021-08-31 MED ORDER — ANASTROZOLE 1 MG PO TABS
1.0000 mg | ORAL_TABLET | Freq: Every day | ORAL | Status: DC
  Administered 2021-09-01 – 2021-09-02 (×2): 1 mg via ORAL
  Filled 2021-08-31 (×2): qty 1

## 2021-08-31 MED ORDER — ACETAMINOPHEN 325 MG PO TABS: 650.0000 mg | ORAL_TABLET | Freq: Four times a day (QID) | ORAL | Status: DC | PRN

## 2021-08-31 MED ORDER — ENSURE ENLIVE PO LIQD
237.0000 mL | Freq: Two times a day (BID) | ORAL | Status: DC
  Administered 2021-09-01 – 2021-09-02 (×2): 237 mL via ORAL

## 2021-08-31 MED ORDER — MEGESTROL ACETATE 400 MG/10ML PO SUSP
625.0000 mg | Freq: Every day | ORAL | Status: DC
  Administered 2021-09-01 – 2021-09-02 (×2): 625 mg via ORAL
  Filled 2021-08-31 (×2): qty 20

## 2021-08-31 MED ORDER — ACETAMINOPHEN 650 MG RE SUPP: 650.0000 mg | Freq: Four times a day (QID) | RECTAL | Status: DC | PRN

## 2021-08-31 MED ORDER — INSULIN ASPART 100 UNIT/ML IJ SOLN
0.0000 [IU] | Freq: Three times a day (TID) | INTRAMUSCULAR | Status: DC
  Administered 2021-09-01 (×2): 2 [IU] via SUBCUTANEOUS
  Administered 2021-09-02: 1 [IU] via SUBCUTANEOUS
  Filled 2021-08-31 (×3): qty 1

## 2021-08-31 MED ORDER — METOPROLOL TARTRATE 25 MG PO TABS
37.5000 mg | ORAL_TABLET | Freq: Two times a day (BID) | ORAL | Status: DC
  Administered 2021-08-31 – 2021-09-02 (×4): 37.5 mg via ORAL
  Filled 2021-08-31 (×4): qty 2

## 2021-08-31 MED ORDER — CLONIDINE HCL 0.1 MG PO TABS
0.1000 mg | ORAL_TABLET | Freq: Two times a day (BID) | ORAL | Status: DC
  Administered 2021-08-31 – 2021-09-02 (×4): 0.1 mg via ORAL
  Filled 2021-08-31 (×4): qty 1

## 2021-08-31 MED ORDER — AMLODIPINE BESYLATE 5 MG PO TABS
10.0000 mg | ORAL_TABLET | Freq: Every day | ORAL | Status: DC
  Administered 2021-09-01 – 2021-09-02 (×2): 10 mg via ORAL
  Filled 2021-08-31 (×2): qty 2

## 2021-08-31 MED ORDER — SODIUM CHLORIDE 0.9 % IV SOLN
Freq: Once | INTRAVENOUS | Status: AC
Start: 2021-08-31 — End: 2021-08-31

## 2021-08-31 MED ORDER — IRBESARTAN 150 MG PO TABS
150.0000 mg | ORAL_TABLET | Freq: Every day | ORAL | Status: DC
  Administered 2021-09-01 – 2021-09-02 (×2): 150 mg via ORAL
  Filled 2021-08-31 (×2): qty 1

## 2021-08-31 MED ORDER — ATORVASTATIN CALCIUM 20 MG PO TABS
40.0000 mg | ORAL_TABLET | Freq: Every day | ORAL | Status: DC
  Administered 2021-09-01 – 2021-09-02 (×2): 40 mg via ORAL
  Filled 2021-08-31 (×2): qty 2

## 2021-08-31 MED ORDER — TRAZODONE HCL 50 MG PO TABS: 25.0000 mg | ORAL_TABLET | Freq: Every evening | ORAL | Status: DC | PRN

## 2021-08-31 MED ORDER — ENOXAPARIN SODIUM 30 MG/0.3ML IJ SOSY
30.0000 mg | PREFILLED_SYRINGE | INTRAMUSCULAR | Status: DC
  Administered 2021-08-31 – 2021-09-01 (×2): 30 mg via SUBCUTANEOUS
  Filled 2021-08-31 (×2): qty 0.3

## 2021-08-31 MED ORDER — SODIUM CHLORIDE 0.9 % IV SOLN
2.0000 g | INTRAVENOUS | Status: DC
  Administered 2021-08-31 – 2021-09-01 (×2): 2 g via INTRAVENOUS
  Filled 2021-08-31: qty 20
  Filled 2021-08-31: qty 2
  Filled 2021-08-31: qty 20

## 2021-08-31 MED ORDER — ONDANSETRON HCL 4 MG PO TABS: 4.0000 mg | ORAL_TABLET | Freq: Four times a day (QID) | ORAL | Status: DC | PRN

## 2021-08-31 MED ORDER — LACTATED RINGERS IV SOLN: INTRAVENOUS | Status: AC

## 2021-08-31 MED ORDER — ONDANSETRON HCL 4 MG/2ML IJ SOLN: 4.0000 mg | Freq: Four times a day (QID) | INTRAMUSCULAR | Status: DC | PRN

## 2021-08-31 NOTE — Assessment & Plan Note (Addendum)
Patient is ill-appearing and very lethargic.  She is very weak. Blood pressure 106/50.  Temperature of 96. Concern also for sepsis , possible UTI.She will respond briefly to questions but then falls asleep almost midsentence.  She is not drinking or eating.  Constipation appears resolved at this time.  I have not seen patient in person since 04/2021 and very concerned with decompensation.  Consulted with palliative care Soledad Gerlach whom has been following patient for almost 2 years. We agreed that hospice should be discussed. I discussed with daughters [name] and [name] today. They declined referral at this time. I advised she was not well to be sent home without nursing services. Daughters agreed with proceeding to  ED .  [name] would like her mother to regain her strength. EMS arrived and transported patient to ED. I gave triage to charge nurse, Aldona Bar, in ED.  ?

## 2021-08-31 NOTE — H&P (Signed)
History and Physical    Patient: Tiffany Bailey OZH:[id number] DOB: Jun 24, 1927 DOA: 08/31/2021 DOS: the patient was seen and examined on 08/31/2021 PCP: Allegra Grana, FNP  Patient coming from: Home  Chief Complaint:  Chief Complaint  Patient presents with   Failure To Thrive   HPI: Tiffany Bailey is a 88 y.o. female with medical history significant for metastatic breast cancer currently engaging chemotherapy, type 2 diabetes, hypertension, PAD, CAD, CKD, history of AKA, who presents with daughters due to worsening weakness and inability to care for herself.  Patient was seen by PCP in office earlier today.  Was the first time she had been seen in about 4 months and provider was very concerned with acute change in her overall health, noted that she appeared to be very decompensated and recommended that she proceed to the ED.  She also broached the topic of hospice care with patient's daughters which they declined referral for that time, and they proceeded to the ED.  Majority of history obtained from daughters though patient is perfectly able to engage in questioning.  They report that they have noticed a significant decline in her ability to care for herself or have regular food and water intake over approximately the past 3 weeks.  They note that prior to this patient was able to feed herself, transfer from chair to chair, and do many of her ADLs independently.  For the past 3 weeks however she has become increasingly lethargic, less interested in food and drink, and requiring full assist for all ADLs.  She had been living at home but more recently has been living with her son.  They admit that caring for her has become much more difficult over the past several weeks.  In the ED vital signs unremarkable.  BMP at her baseline without any major abnormalities, CBC showed mildly worse chronic anemia at 8.6 but otherwise also at her baseline.  UA showed large leuks, negative nitrite.  CT head  without contrast showed no acute findings.  EKG was unchanged from prior.  Chest x-ray showed possibly progressed right perihilar airspace opacity, and stable right pleural effusion.  Patient also reported to ED provider that she had pain with urination, though she did not endorse this to me.  She was given 2 g ceftriaxone for empiric treatment of UTI and admitted for further management.  Review of Systems: As mentioned in the history of present illness. All other systems reviewed and are negative. Past Medical History:  Diagnosis Date   Anemia    Anginal pain (HCC)    Arthritis    Breast cancer (HCC) 07/19/2019   CKD (chronic kidney disease), stage III (HCC)    Coronary artery disease    a. 2002 CABG x 3 (LIMA-LAD, SVG-D1, SVG-RCA); b. s/p multiple PCI's.   Diabetes mellitus without complication (HCC)    Diastolic dysfunction    a. 12/2017 Echo: EF 55-60%, no rwma, Gr1 DD. Triv AI. Mild-mod MR/TR. PASP .   Edema    Failure to thrive in adult    Fecal impaction (HCC) 08/23/2021   GERD (gastroesophageal reflux disease)    Hb-SS disease without crisis (HCC) 10/23/2017   History of breast cancer    HOH (hard of hearing)    Hyperlipidemia    Hypertension    Myocardial infarction [facility])    PAD (peripheral artery disease) (HCC)    a. s/p L AKA; b. 12/2018 PTA R peroneal & PTA/Stenting R SFA.   Palpitations  Past Surgical History:  Procedure Laterality Date   ABDOMINAL HYSTERECTOMY     ABOVE KNEE LEG AMPUTATION Left 2013   ANTERIOR VITRECTOMY Left 11/16/2015   Procedure: ANTERIOR VITRECTOMY;  Surgeon: Nevada Crane, MD;  Location: [facility] ORS;  Service: Ophthalmology;  Laterality: Left;   BLADDER SURGERY     CATARACT EXTRACTION W/PHACO Left 11/16/2015   Procedure: CATARACT EXTRACTION PHACO AND INTRAOCULAR LENS PLACEMENT (IOC);  Surgeon: Nevada Crane, MD;  Location: [facility] ORS;  Service: Ophthalmology;  Laterality: Left;  Lot # [id number] [place]; 01:22.8 AP%:12.6 CDE: 10.46   CATARACT  EXTRACTION W/PHACO Right 08/22/2016   Procedure: CATARACT EXTRACTION PHACO AND INTRAOCULAR LENS PLACEMENT (IOC);  Surgeon: Nevada Crane, MD;  Location: [facility] ORS;  Service: Ophthalmology;  Laterality: Right;  Lot # [id number] [place]: 00:52.1 AP%:8.7 CDE: 4.99    CORONARY ANGIOPLASTY     STENT   CORONARY ARTERY BYPASS GRAFT     LOWER EXTREMITY ANGIOGRAPHY Right 01/14/2019   Procedure: LOWER EXTREMITY ANGIOGRAPHY;  Surgeon: Annice Needy, MD;  Location: [facility] INV[facility] CV LAB;  Service: Cardiovascular;  Laterality: Right;   MASTECTOMY     TUMOR REMOVAL     ABDOMINAL   Social History:  reports that she has never smoked. She has never used smokeless tobacco. She reports that she does not drink alcohol and does not use drugs.  Allergies  Allergen Reactions   Amoxicillin Hives and Nausea Only   Gabapentin Other (See Comments)    AMS Confusion   Mirtazapine Itching    Dry mouth with ODT Denies SOB with medication   Other     UNKNOWN PAIN MEDICATION - unknown reaction    Family History  Problem Relation Age of Onset   Sudden death Mother    Arthritis Father    Stroke Father    Hypertension Sister    Hypertension Sister    Diabetes Grandchild     Prior to Admission medications   Medication Sig Start Date End Date Taking? Authorizing Provider  amLODipine (NORVASC) 10 MG tablet Take 1 tablet (10 mg total) by mouth daily. 05/23/21  Yes [name], Lyn Records, FNP  anastrozole (ARIMIDEX) 1 MG tablet Take 1 mg by mouth daily.   Yes [provider]  aspirin EC 81 MG tablet Take 1 tablet (81 mg total) by mouth daily. 01/15/17  Yes Cook, Jayce G, DO  atorvastatin (LIPITOR) 40 MG tablet TAKE 1 TABLET BY MOUTH  DAILY 04/05/21  Yes [name], Lyn Records, FNP  bisacodyl (DULCOLAX) 5 MG EC tablet Take 1 tablet (5 mg total) by mouth daily as needed for moderate constipation. 08/24/21 08/24/22 Yes Esaw Grandchild A, DO  cloNIDine (CATAPRES) 0.1 MG tablet Take 1 tablet (0.1 mg total) by mouth 2 (two) times  daily. 06/28/21  Yes Allegra Grana, FNP  clopidogrel (PLAVIX) 75 MG tablet TAKE 1 TABLET BY MOUTH  DAILY 01/05/21  Yes [name], Tollie Pizza, MD  hydrALAZINE (APRESOLINE) 10 MG tablet Take 1 tablet (10 mg total) by mouth in the morning and at bedtime. 03/02/21  Yes [name], [name], PA-C  megestrol (MEGACE ES) 625 MG/5ML suspension Take 5 mLs (625 mg total) by mouth daily. 07/30/21  Yes Borders, Daryl Eastern, NP  Metoprolol Tartrate 37.5 MG TABS Take 37.5 mg by mouth 2 (two) times daily.   Yes [provider]  polyethylene glycol (MIRALAX / GLYCOLAX) 17 g packet Take 17 g by mouth daily. 08/25/21  Yes Esaw Grandchild A, DO  senna (SENOKOT) 8.6 MG TABS  tablet Take 1 tablet (8.6 mg total) by mouth at bedtime. 08/24/21  Yes Esaw Grandchild A, DO  valsartan (DIOVAN) 160 MG tablet Take 1 tablet (160 mg total) by mouth daily. 07/11/21  Yes [name], [name], PA-C  vitamin C (ASCORBIC ACID) 250 MG tablet Take 250 mg by mouth daily.   Yes [provider]  blood glucose meter kit and supplies KIT Dispense based on patient and insurance preference. Use up to four times daily as directed. (FOR ICD-9 250.00, 250.01). 01/13/17   Everlene Other G, DO  Blood Glucose Monitoring Suppl (ONETOUCH VERIO REFLECT) w/Device KIT 1 Device by Does not apply route daily. 06/20/21   Allegra Grana, FNP  Continuous Blood Gluc Receiver (FREESTYLE LIBRE 2 READER) DEVI Use to check blood sugar continuously 06/29/21   Allegra Grana, FNP  Continuous Blood Gluc Sensor (FREESTYLE LIBRE 2 SENSOR) MISC Apply 1 each topically every 14 (fourteen) days. 07/11/21   Allegra Grana, FNP  feeding supplement (ENSURE ENLIVE / ENSURE PLUS) LIQD Take 237 mLs by mouth 2 (two) times daily between meals. 03/31/21   Darlin Priestly, MD  glucose blood (ONE TOUCH TEST STRIPS) test strip Use to test blood sugar twice daily 06/15/21   Allegra Grana, FNP  Insulin Glargine (TOUJEO SOLOSTAR [place]) Inject 4 Units into the skin daily. Patient not  taking: Reported on 08/31/2021    [provider]  Insulin Pen Needle (BD PEN NEEDLE NANO U/F) 32G X 4 MM MISC USE 1 DAILY 06/27/21   Allegra Grana, FNP  Lancets [facility] ULTRASOFT) lancets 1 each by Other route 2 (two) times daily. 90 day supply for [employer].  Dx E11.9 06/26/21   Allegra Grana, FNP  nitroGLYCERIN (NITROSTAT) 0.4 MG SL tablet Place 1 tablet (0.4 mg total) under the tongue every 5 (five) minutes as needed for chest pain. 07/11/21   [name], [name], PA-C  ondansetron (ZOFRAN) 4 MG tablet Take 1 tablet (4 mg total) by mouth every 6 (six) hours as needed for nausea or vomiting. Patient not taking: Reported on 08/31/2021 05/24/21   Rickard Patience, MD    Physical Exam: Vitals:   08/31/21 1530 08/31/21 1600 08/31/21 1630 08/31/21 1700  BP: (!) 135/50 (!) 116/48 (!) 128/45 (!) 146/55  Pulse: 65 69 65 73  Resp: (!) 22 19 16  (!) 23  Temp:      TempSrc:      SpO2: 100% 100% 100% 99%   Physical Exam Constitutional:      General: She is not in acute distress.    Appearance: She is ill-appearing. She is not toxic-appearing or diaphoretic.  HENT:     Head: Normocephalic.     Mouth/Throat:     Mouth: Mucous membranes are dry.  Eyes:     General: No scleral icterus. Cardiovascular:     Rate and Rhythm: Normal rate and regular rhythm.     Heart sounds: No murmur heard. Pulmonary:     Effort: Pulmonary effort is normal. No respiratory distress.     Breath sounds: Normal breath sounds. No wheezing or rales.  Abdominal:     General: Abdomen is flat. There is no distension.     Palpations: Abdomen is soft.     Tenderness: There is no abdominal tenderness.  Musculoskeletal:        General: No swelling.     Comments: L sided above knee amputation present  Skin:    General: Skin is warm and dry.  Neurological:     General: No focal deficit present.     Mental Status: She is alert.  Psychiatric:        Mood and Affect: Mood normal.        Thought Content:  Thought content normal.     Data Reviewed: ECG: Sinus rhythm, ST slurring and T wave inversion in the inferior lateral leads.  Compared to prior there are no significant changes.  Assessment and Plan: KADELYN GOMPF is a 88 y.o. female with medical history significant for metastatic breast cancer currently engaging chemotherapy, type 2 diabetes, hypertension, PAD, CAD, CKD, history of AKA, who presents with daughters due to worsening weakness and inability to care for herself.  * UTI (urinary tract infection) Medium low suspicion at this time, but will continue to treat with ceftriaxone and follow-up on urine culture.  Physical deconditioning Patient with profound decline in ability to care for self over the past 3 weeks.  Unclear if this is multifactorial end-stage decline or possibly related to UTI, strongly suspect the former.  I had conversation with patient and her daughters regarding possibility of having a more focused discussion around palliative and hospice care (they already have a palliative nurse coming to the house for the past few years) and they were open to this.  I did discuss that likely this would not occur until Monday. - PT/OT/SLP consults - Nutrition consult - TOC consult - Palliative care consult - Continue Ensure supplements, Megace   Chronic medical conditions Hypertension-continue amlodipine, clonidine, metoprolol, valsartan  CAD-continue aspirin, clopidogrel.  Consider discontinuation given likely low benefit in setting of other comorbidities.  Hyperlipidemia-continue atorvastatin.  Consider discontinuation given the likely hood of benefit in setting of other comorbidities.  DM2-lispro sliding scale correction factor only  Breast cancer-continue anastrozole  Observation-continue daily MiraLAX   Advance Care Planning:   Code Status: Full Code   Consults: Palliative, TOC, PT, OT, SLP, Nutrition  Family Communication: Daughters updated at  bedside  Severity of Illness: The appropriate patient status for this patient is INPATIENT. Inpatient status is judged to be reasonable and necessary in order to provide the required intensity of service to ensure the patient's safety. The patient's presenting symptoms, physical exam findings, and initial radiographic and laboratory data in the context of their chronic comorbidities is felt to place them at high risk for further clinical deterioration. Furthermore, it is not anticipated that the patient will be medically stable for discharge from the hospital within 2 midnights of admission.   * I certify that at the point of admission it is my clinical judgment that the patient will require inpatient hospital care spanning beyond 2 midnights from the point of admission due to high intensity of service, high risk for further deterioration and high frequency of surveillance required.*  Author: Venora Maples, MD 08/31/2021 6:18 PM  For on call review [email or web address].

## 2021-08-31 NOTE — Consult Note (Signed)
PHARMACY -  BRIEF ANTIBIOTIC NOTE  ? ?Pharmacy has received consult(s) for Levaqui from an ED provider.  The patient's profile has been reviewed for ht/wt/allergies/indication/available labs.   ? ?Pharmacist to investigate beta-lactam allergy. If history of intolerance, mild allergy, or documented history of use of cephalosporins, pharmacy can adjust levofloxacin to ceftriaxone. ? ? ?*Patient describes reaction as "feeling out of her head". No recall of anaphylaxis, breathing issues or ER visit.  ? ?One time order(s) placed for Ceftriaxone '2mg'$  ? ?Further antibiotics/pharmacy consults should be ordered by admitting physician if indicated.       ?                ?Thank you, ?Kimra Kantor Rodriguez-Guzman PharmD, BCPS ?08/31/2021 4:12 PM ? ?

## 2021-08-31 NOTE — ED Triage Notes (Signed)
Pt comes with c/o of failure to thrive. Pt was at the [facility] for routine check up. Doc concerned about possible sepsis and wanted pt brought to ED.  ? ?BP-147/50 ?HR-63 ?T-98.9 ?CBG-178 ?O2-100% RA ? ?Pt states some knee pain and possible sore on back . Pt does have cancer and was recently evaluated for constipation for 4 weeks. ?

## 2021-08-31 NOTE — ED Notes (Signed)
Lavender top and light green top sent to lab at this time, pt was straight stuck.  ?

## 2021-08-31 NOTE — Assessment & Plan Note (Addendum)
May be mild based on UA.  Continue IV Rocephin for now.  Await urine culture ?

## 2021-08-31 NOTE — Progress Notes (Signed)
? ?Subjective:  ? ? Patient ID: Tiffany Bailey, female    DOB: 16-Sep-1927, 88 y.o.   MRN: [id number] ? ?CC: Tiffany Bailey is a 88 y.o. female who presents today for follow up.  ? ?HPI: Daughters, Tiffany Bailey and Tiffany Bailey ?Tiffany Bailey is primary historian ? ?Patient states 'I dont feel well. '  ? ? ?Follow-up hospitalization ?Constipation - bowel movement yesterday. She has takes  miralax 3 times a day plus 2 senna 1-3 times a day as needed. She ate meal this morning. She is not drinking water.  ? ?DM-compliant with Toujeo 4 units every afternoon. She is not checking blood sugar.  ? ?HTN- compliant with amlodipine 10 mg daily,hydralazine 10 mg BID, clonidine 0.1 mg BID, metoprolol tartrate 37.5 mg BID, valsartan 160 mg .  ? ?admitted 08/23/2021 and discharged the following day for constipation.  CT abdomen and pelvis showed large fecal impaction in the ascending and transverse colon.  Advised to continue scheduled stool softeners and as needed stimulant laxatives.  At outpatient consider resuming Linzess. ?Urinary retention and she required multiple in and out catheterization.  Sacral pressure injury.  She was discharged with Foley catheter.  Appointment made with Dr. Erlene Quan 09/04/2021 home health care referral ? ?Metastatic breast cancer currently undergoing chemotherapy and following with Dr. Tasia Catchings.  Follow-up 08/28/2021.  She is on monthly fulvestrant, megace for malnutrition. IV fluids given. Labs obtained 08/28/21  ?Hemoglobin 10.1, Sodium 133, Crt 1.53 ?A1c 8.5 08/23/21 ? ?CT chest shows right pleural effusion 07/06/21, interval decrease in size of subcarinal lymph nodes consistent with treatment response of nodal metastatic disease. ? ?HISTORY:  ?Past Medical History:  ?Diagnosis Date  ? Anemia   ? Anginal pain ([place])   ? Arthritis   ? Breast cancer ([place]) 07/19/2019  ? CKD (chronic kidney disease), stage III ([place])   ? Coronary artery disease   ? a. 2002 CABG x 3 (LIMA-LAD, SVG-D1, SVG-RCA); b. s/p multiple PCI's.  ? Diabetes  mellitus without complication ([place])   ? Diastolic dysfunction   ? a. 12/2017 Echo: EF 55-60%, no rwma, Gr1 DD. Triv AI. Mild-mod MR/TR. PASP 58mHg.  ? Edema   ? Failure to thrive in adult   ? [place]action ([place] 08/23/2021  ? GERD (gastroesophageal reflux disease)   ? Hb-SS disease without crisis ([place] 10/23/2017  ? History of breast cancer   ? HOH (hard of hearing)   ? Hyperlipidemia   ? Hypertension   ? Myocardial infarction ([facility]   ? PAD (peripheral artery disease) ([place]   ? a. s/p L AKA; b. 12/2018 PTA R peroneal & PTA/Stenting R SFA.  ? Palpitations   ? ?Past Surgical History:  ?Procedure Laterality Date  ? ABDOMINAL HYSTERECTOMY    ? ABOVE KNEE LEG AMPUTATION Left 2013  ? ANTERIOR VITRECTOMY Left 11/16/2015  ? Procedure: ANTERIOR VITRECTOMY;  Surgeon: BEulogio Bear MD;  Location: [facility] ORS;  Service: Ophthalmology;  Laterality: Left;  ? BLADDER SURGERY    ? CATARACT EXTRACTION W/PHACO Left 11/16/2015  ? Procedure: CATARACT EXTRACTION PHACO AND INTRAOCULAR LENS PLACEMENT (IOC);  Surgeon: BEulogio Bear MD;  Location: [facility] ORS;  Service: Ophthalmology;  Laterality: Left;  Lot # [id number] ?[place] 01:22.8 ?AP%:12.6 ?CDE: 10.46  ? CATARACT EXTRACTION W/PHACO Right 08/22/2016  ? Procedure: CATARACT EXTRACTION PHACO AND INTRAOCULAR LENS PLACEMENT (IOC);  Surgeon: BEulogio Bear MD;  Location: [facility] ORS;  Service: Ophthalmology;  Laterality: Right;  Lot # [id number] ?[place] 00:52.1 ?AP%:8.7 ?CDE: 4.99 ?  ? CORONARY ANGIOPLASTY    ?  STENT  ? CORONARY ARTERY BYPASS GRAFT    ? LOWER EXTREMITY ANGIOGRAPHY Right 01/14/2019  ? Procedure: LOWER EXTREMITY ANGIOGRAPHY;  Surgeon: Algernon Huxley, MD;  Location: [place] [facility];  Service: Cardiovascular;  Laterality: Right;  ? MASTECTOMY    ? TUMOR REMOVAL    ? ABDOMINAL  ? ?Family History  ?Problem Relation Age of Onset  ? Sudden death Mother   ? Arthritis Father   ? Stroke Father   ? Hypertension Sister   ? Hypertension Sister   ? Diabetes Grandchild   ? ? ?Allergies: Amoxicillin,  Gabapentin, Mirtazapine, and Other ?Current Outpatient Medications on File Prior to Visit  ?Medication Sig Dispense Refill  ? amLODipine (NORVASC) 10 MG tablet Take 1 tablet (10 mg total) by mouth daily. 90 tablet 1  ? anastrozole (ARIMIDEX) 1 MG tablet Take 1 mg by mouth daily.    ? aspirin EC 81 MG tablet Take 1 tablet (81 mg total) by mouth daily. 30 tablet 5  ? atorvastatin (LIPITOR) 40 MG tablet TAKE 1 TABLET BY MOUTH  DAILY 90 tablet 3  ? bisacodyl (DULCOLAX) 5 MG EC tablet Take 1 tablet (5 mg total) by mouth daily as needed for moderate constipation. 30 tablet 0  ? blood glucose meter kit and supplies KIT Dispense based on patient and insurance preference. Use up to four times daily as directed. (FOR ICD-9 250.00, 250.01). 1 each 0  ? Blood Glucose Monitoring Suppl (ONETOUCH VERIO REFLECT) w/Device KIT 1 Device by Does not apply route daily. 1 kit 0  ? cloNIDine (CATAPRES) 0.1 MG tablet Take 1 tablet (0.1 mg total) by mouth 2 (two) times daily. 60 tablet 2  ? clopidogrel (PLAVIX) 75 MG tablet TAKE 1 TABLET BY MOUTH  DAILY 30 tablet 0  ? Continuous Blood Gluc Receiver (FREESTYLE LIBRE 2 READER) DEVI Use to check blood sugar continuously 1 each 1  ? Continuous Blood Gluc Sensor (FREESTYLE LIBRE 2 SENSOR) MISC Apply 1 each topically every 14 (fourteen) days. 2 each 11  ? feeding supplement (ENSURE ENLIVE / ENSURE PLUS) LIQD Take 237 mLs by mouth 2 (two) times daily between meals.    ? glucose blood (ONE TOUCH TEST STRIPS) test strip Use to test blood sugar twice daily 200 each 3  ? hydrALAZINE (APRESOLINE) 10 MG tablet Take 1 tablet (10 mg total) by mouth in the morning and at bedtime. 180 tablet 3  ? Insulin Glargine (TOUJEO SOLOSTAR [place]) Inject 4 Units into the skin daily.    ? Insulin Pen Needle (BD PEN NEEDLE NANO U/F) 32G X 4 MM MISC USE 1 DAILY 100 each 12  ? Lancets (ONETOUCH ULTRASOFT) lancets 1 each by Other route 2 (two) times daily. 90 day supply for [employer].  Dx E11.9 300 each 12  ?  megestrol (MEGACE ES) 625 MG/5ML suspension Take 5 mLs (625 mg total) by mouth daily. 150 mL 0  ? Metoprolol Tartrate 37.5 MG TABS Take 37.5 mg by mouth 2 (two) times daily.    ? nitroGLYCERIN (NITROSTAT) 0.4 MG SL tablet Place 1 tablet (0.4 mg total) under the tongue every 5 (five) minutes as needed for chest pain. 25 tablet 1  ? ondansetron (ZOFRAN) 4 MG tablet Take 1 tablet (4 mg total) by mouth every 6 (six) hours as needed for nausea or vomiting. 60 tablet 1  ? polyethylene glycol (MIRALAX / GLYCOLAX) 17 g packet Take 17 g by mouth daily. 1000 each 0  ? senna (SENOKOT) 8.6 MG  TABS tablet Take 1 tablet (8.6 mg total) by mouth at bedtime. 120 tablet 0  ? valsartan (DIOVAN) 160 MG tablet Take 1 tablet (160 mg total) by mouth daily. 90 tablet 3  ? vitamin C (ASCORBIC ACID) 250 MG tablet Take 250 mg by mouth daily.    ? ?No current facility-administered medications on file prior to visit.  ? ? ?Social History  ? ?Tobacco Use  ? Smoking status: Never  ? Smokeless tobacco: Never  ?Vaping Use  ? Vaping Use: Never used  ?Substance Use Topics  ? Alcohol use: No  ? Drug use: No  ? ? ?Review of Systems  ?Constitutional:  Positive for activity change, appetite change and fatigue. Negative for chills and fever.  ?Respiratory:  Negative for cough.   ?Cardiovascular:  Negative for chest pain and palpitations.  ?Gastrointestinal:  Negative for nausea and vomiting.  ?Genitourinary:  Positive for difficulty urinating.  ?Neurological:  Positive for weakness.  ?   ?Objective:  ?  ?BP (!) 106/50 (BP Location: Left Arm, Patient Position: Sitting, Cuff Size: Normal)   Pulse 61   Temp (!) 96 ?F (35.6 ?C) (Oral)  ?BP Readings from Last 3 Encounters:  ?08/31/21 (!) 106/50  ?08/28/21 (!) 156/74  ?08/24/21 (!) 132/43  ? ?Wt Readings from Last 3 Encounters:  ?08/28/21 94 lb 11.2 oz (43 kg)  ?08/23/21 95 lb 0.3 oz (43.1 kg)  ?08/23/21 95 lb (43.1 kg)  ? ? ?Physical Exam ?Vitals reviewed.  ?Constitutional:   ?   Appearance: She is  well-developed. She is ill-appearing.  ?Cardiovascular:  ?   Rate and Rhythm: Normal rate and regular rhythm.  ?   Pulses: Normal pulses.  ?   Heart sounds: Normal heart sounds.  ?Pulmonary:  ?   Effort: Pulmonary effort

## 2021-08-31 NOTE — Assessment & Plan Note (Addendum)
Patient with profound decline in ability to care for self over the past 3 weeks.  Unclear if this is multifactorial end-stage decline or possibly related to UTI, strongly suspect the former.  I had conversation with patient's son Tiffany Bailey earlier in the morning who was in agreement for hospice at home.  Later her other son Tiffany Bailey came who refuted what Tiffany Bailey said.  Tiffany Bailey mention patient staying with her and him making the decision regarding her care.  He feels this is transient decline in her clinical condition including ambulation and expects full recovery.  PT and OT recommends SNF Tiffany Bailey refuses and wants to take her home with [facility] PT and OT. ? ?Tiffany Bailey feels her difficulty ambulation has to do with her having Foley and being in the hospital.  I have requested Foley removal and voiding trial after which we can consider discharge.  He is in agreement.  I had discussion with him at the bedside and also over the phone.  I had also discussed with patient's daughter over the phone.  They all want Tiffany Bailey to make decision ?

## 2021-08-31 NOTE — ED Notes (Signed)
Pt stuck multiple times with no success, MD and RN informed ? ?

## 2021-08-31 NOTE — Progress Notes (Signed)
[facility]) Hospital Liaison note: ? ?This patient is currently enrolled in [facility] outpatient-based Palliative Care. Will continue to follow for disposition. ? ?Please call with any outpatient palliative questions or concerns. ? ?Thank you, ?Lorelee Market, LPN ?[facility] Hospital Liaison ?[phone] ?

## 2021-08-31 NOTE — ED Provider Notes (Signed)
? ? [facility] ?Provider Note ? ? ? Event Date/Time  ? First MD Initiated Contact with Patient 08/31/21 1259   ?  (approximate) ? ? ?History  ? ?Failure To Thrive ? ? ?HPI ? ?Tiffany Bailey is a 88 y.o. female presents from clinic due to failure to thrive and deterioration over the past several months.  Patient states she does have a wound on her buttock.  States she does not have much of an appetite otherwise has no acute complaints.  Denies any nausea or vomiting.  No chest pain or shortness of breath.  No fevers or chills. ?  ? ? ?Physical Exam  ? ?Triage Vital Signs: ?ED Triage Vitals  ?Enc Vitals Group  ?   BP 08/31/21 1307 (!) 146/51  ?   Pulse Rate 08/31/21 1307 61  ?   Resp 08/31/21 1307 19  ?   Temp 08/31/21 1307 97.7 ?F (36.5 ?C)  ?   Temp Source 08/31/21 1307 Oral  ?   SpO2 08/31/21 1307 100 %  ?   Weight --   ?   Height --   ?   Head Circumference --   ?   Peak Flow --   ?   Pain Score 08/31/21 1305 10  ?   Pain Loc --   ?   Pain Edu? --   ?   Excl. in [place]? --   ? ? ?Most recent vital signs: ?Vitals:  ? 08/31/21 1430 08/31/21 1500  ?BP: (!) 129/53 (!) 133/59  ?Pulse: 67 63  ?Resp: 15 19  ?Temp:    ?SpO2: 100% 99%  ? ? ? ?Constitutional: Alert  ?Eyes: Conjunctivae are normal.  ?Head: Atraumatic. ?Nose: No congestion/rhinnorhea. ?Mouth/Throat: Mucous membranes are dry.   ?Neck: Painless ROM.  ?Cardiovascular:   Good peripheral circulation. ?Respiratory: Normal respiratory effort.  No retractions.  ?Gastrointestinal: Soft and nontender.  ?Musculoskeletal:  no deformity, left BKA, stage II sacral decub ?Neurologic:  MAE spontaneously. No gross focal neurologic deficits are appreciated.  ?Skin:  Skin is warm, dry and intact. No rash noted. ?Psychiatric: Mood and affect are normal. Speech and behavior are normal. ? ? ? ?ED Results / Procedures / Treatments  ? ?Labs ?(all labs ordered are listed, but only abnormal results are displayed) ?Labs Reviewed  ?BASIC METABOLIC PANEL - Abnormal;  Notable for the following components:  ?    Result Value  ? Sodium 132 (*)   ? CO2 18 (*)   ? Glucose, Bld 143 (*)   ? BUN 34 (*)   ? Creatinine, Ser 1.42 (*)   ? Calcium 8.5 (*)   ? GFR, Estimated 35 (*)   ? All other components within normal limits  ?CBC - Abnormal; Notable for the following components:  ? WBC 12.1 (*)   ? RBC 2.75 (*)   ? Hemoglobin 8.6 (*)   ? HCT 27.5 (*)   ? All other components within normal limits  ?URINALYSIS, ROUTINE W REFLEX MICROSCOPIC - Abnormal; Notable for the following components:  ? Color, Urine AMBER (*)   ? APPearance TURBID (*)   ? Hgb urine dipstick LARGE (*)   ? Protein, ur 100 (*)   ? Leukocytes,Ua LARGE (*)   ? WBC, UA >50 (*)   ? Bacteria, UA RARE (*)   ? All other components within normal limits  ?CBG MONITORING, ED  ? ? ? ?EKG ? ?ED ECG REPORT ?Tyrone Nine, the attending physician, personally viewed and  interpreted this ECG. ? ? Date: 08/31/2021 ? EKG Time: 13:07 ? Rate: 60 ? Rhythm: sinus ? Axis: normal ? Intervals: normal qt ? ST&T Change: no stemi, nonspecific st abn ? ? ? ?RADIOLOGY ?Please see ED Course for my review and interpretation. ? ?I personally reviewed all radiographic images ordered to evaluate for the above acute complaints and reviewed radiology reports and findings.  These findings were personally discussed with the patient.  Please see medical record for radiology report. ? ? ? ?PROCEDURES: ? ?Critical Care performed: No ? ?Procedures ? ? ?MEDICATIONS ORDERED IN ED: ?Medications  ?0.9 %  sodium chloride infusion (has no administration in time range)  ?levofloxacin (LEVAQUIN) IVPB 750 mg (has no administration in time range)  ? ? ? ?IMPRESSION / MDM / ASSESSMENT AND PLAN / ED COURSE  ?I reviewed the triage vital signs and the nursing notes. ?             ?               ? ?Differential diagnosis includes, but is not limited to, Dehydration, sepsis, pna, uti, hypoglycemia, cva, drug effect, withdrawal, encephalitis ? ?Patient with significant  decline in status not febrile not tachycardic normotensive here.  Blood work and imaging sent with above differential.  The patient will be placed on continuous pulse oximetry and telemetry for monitoring.  Laboratory evaluation will be sent to evaluate for the above complaints.   ? ? ?Clinical Course as of 08/31/21 1526  ?Fri Aug 31, 2021  ?1525 CT head by my review and interpretation without acute intracranial abnormality.  Will be started on IV Levaquin as she has penicillin allergy for UTI.  Will be given IV fluids.  She has had significant decline and family does not feel comfortable taking her home will admit to the hospital for further evaluation management.  Hospitalist consulted who agrees to accept patient to their service. [PR]  ?  ?Clinical Course User Index ?[PR] Merlyn Lot, MD  ? ? ? ?FINAL CLINICAL IMPRESSION(S) / ED DIAGNOSES  ? ?Final diagnoses:  ?Acute cystitis without hematuria  ?Failure to thrive (child)  ? ? ? ?Rx / DC Orders  ? ?ED Discharge Orders   ? ? None  ? ?  ? ? ? ?Note:  This document was prepared using Dragon voice recognition software and may include unintentional dictation errors. ? ?  ?Merlyn Lot, MD ?08/31/21 1527 ? ?

## 2021-08-31 NOTE — ED Notes (Signed)
Informed RN bed assigned 

## 2021-08-31 NOTE — ED Notes (Signed)
Pt. Transferred from ED rm 8 to ED rm. 36. Pt. Is conversational, alert and oriented. Two family members with pt. Pt. Denies pain currently, NAD, denies further need. Pt. Positioned for comfort. ?

## 2021-08-31 NOTE — ED Notes (Addendum)
This RN attempted to transfer pt. From ED Rm. 8 to ED rm 36, but hospitalist was having extensive discussion with pt. And family. This RN asked primary RN to call her when pt. Ready to be transferred. ?

## 2021-09-01 DIAGNOSIS — I25118 Atherosclerotic heart disease of native coronary artery with other forms of angina pectoris: Secondary | ICD-10-CM

## 2021-09-01 DIAGNOSIS — I10 Essential (primary) hypertension: Secondary | ICD-10-CM

## 2021-09-01 DIAGNOSIS — E1122 Type 2 diabetes mellitus with diabetic chronic kidney disease: Secondary | ICD-10-CM

## 2021-09-01 DIAGNOSIS — N183 Chronic kidney disease, stage 3 unspecified: Secondary | ICD-10-CM

## 2021-09-01 DIAGNOSIS — N3000 Acute cystitis without hematuria: Secondary | ICD-10-CM

## 2021-09-01 LAB — BASIC METABOLIC PANEL
Anion gap: 9 (ref 5–15)
BUN: 34 mg/dL — ABNORMAL HIGH (ref 8–23)
CO2: 17 mmol/L — ABNORMAL LOW (ref 22–32)
Calcium: 8.4 mg/dL — ABNORMAL LOW (ref 8.9–10.3)
Chloride: 107 mmol/L (ref 98–111)
Creatinine, Ser: 1.37 mg/dL — ABNORMAL HIGH (ref 0.44–1.00)
GFR, Estimated: 36 mL/min — ABNORMAL LOW (ref >60)
Glucose, Bld: 79 mg/dL (ref 70–99)
Potassium: 4.9 mmol/L (ref 3.5–5.1)
Sodium: 133 mmol/L — ABNORMAL LOW (ref 135–145)

## 2021-09-01 LAB — CBC
HCT: 23 % — ABNORMAL LOW (ref 36.0–46.0)
Hemoglobin: 7.6 g/dL — ABNORMAL LOW (ref 12.0–15.0)
MCH: 31.3 pg (ref 26.0–34.0)
MCHC: 33 g/dL (ref 30.0–36.0)
MCV: 94.7 fL (ref 80.0–100.0)
Platelets: 267 10*3/uL (ref 150–400)
RBC: 2.43 MIL/uL — ABNORMAL LOW (ref 3.87–5.11)
RDW: 14.4 % (ref 11.5–15.5)
WBC: 11.3 10*3/uL — ABNORMAL HIGH (ref 4.0–10.5)
nRBC: 0 % (ref 0.0–0.2)

## 2021-09-01 LAB — GLUCOSE, CAPILLARY
Glucose-Capillary: 66 mg/dL — ABNORMAL LOW (ref 70–99)
Glucose-Capillary: 80 mg/dL (ref 70–99)
Glucose-Capillary: 203 mg/dL — ABNORMAL HIGH (ref 70–99)
Glucose-Capillary: 231 mg/dL — ABNORMAL HIGH (ref 70–99)
Glucose-Capillary: 232 mg/dL — ABNORMAL HIGH (ref 70–99)

## 2021-09-01 NOTE — Assessment & Plan Note (Addendum)
Continue Norvasc, clonidine, Avapro and metoprolol ?

## 2021-09-01 NOTE — Assessment & Plan Note (Signed)
Continue asa, statin. Metoprolol, norvasc ?

## 2021-09-01 NOTE — Assessment & Plan Note (Signed)
PT, OT recommends SNF but son does not want that and requested home health PT OT and discharge tomorrow ?

## 2021-09-01 NOTE — Progress Notes (Signed)
?Progress Note ? ? ?Patient: Tiffany Bailey DOB: [birth year] DOA: 08/31/2021     1 ?DOS: the patient was seen and examined on 09/01/2021 ?  ?Brief hospital course: ?88 y.o. female with medical history significant for metastatic breast cancer currently engaging chemotherapy, type 2 diabetes, hypertension, PAD, CAD, CKD, history of AKA, who presents with daughters due to worsening weakness and inability to care for herself. ? ?3/18 - PT, OT recommends SNF but son does not want any SNF and wants to take her home.  Foley discontinued and waiting for voiding on her own ? ? ?Assessment and Plan: ?* UTI (urinary tract infection) ?May be mild based on UA.  Continue IV Rocephin for now.  Await urine culture ? ?Physical deconditioning ?Patient with profound decline in ability to care for self over the past 3 weeks.  Unclear if this is multifactorial end-stage decline or possibly related to UTI, strongly suspect the former.  I had conversation with patient's son Tiffany Bailey earlier in the morning who was in agreement for hospice at home.  Later her other son Tiffany Bailey came who refuted what Tiffany Bailey said.  [name] mention patient staying with her and him making the decision regarding her care.  He feels this is transient decline in her clinical condition including ambulation and expects full recovery.  PT and OT recommends SNF [name] refuses and wants to take her home with [facility] PT and OT. ? ?Tiffany Bailey feels her difficulty ambulation has to do with her having Foley and being in the hospital.  I have requested Foley removal and voiding trial after which we can consider discharge.  He is in agreement.  I had discussion with him at the bedside and also over the phone.  I had also discussed with patient's daughter over the phone.  They all want [name] to make decision ? ?Pressure injury of sacral region, stage 2 ([place]) ?Continue barrier bracing and recommend ambulation and frequent position change ? ?Hx of AKA (above knee amputation), left  ([place]) ?PT, OT recommends SNF but son does not want that and requested home health PT OT and discharge tomorrow ? ?PAD (peripheral artery disease) ([place]) ?Continue aspirin, statin ? ?Essential hypertension ?Continue Norvasc, clonidine, Avapro and metoprolol ? ?DM (diabetes mellitus) ([place]) ?Blood sugar well controlled on sliding scale ? ? ? ? ?  ? ?Subjective: Feeling weak.  No other complaints ? ?Physical Exam: ?Vitals:  ? 08/31/21 1839 08/31/21 1942 09/01/21 0500 09/01/21 0731  ?BP: (!) 134/48 (!) 149/52 (!) 136/55 (!) 149/47  ?Pulse: 65 73 69 68  ?Resp: '14 18 18 15  '$ ?Temp: 98.1 ?F (36.7 ?C) 99.4 ?F (37.4 ?C) 98.8 ?F (37.1 ?C) 99.9 ?F (37.7 ?C)  ?TempSrc: Oral Oral Oral   ?SpO2: 100% 100% 100% 99%  ? ?Constitutional:   ?   General: She is not in acute distress. ?   Appearance: She is chronically ill-appearing. She is not toxic-appearing or diaphoretic.  ?Eyes:  ?   General: No scleral icterus. ?Cardiovascular:  ?   Rate and Rhythm: Normal rate and regular rhythm.  ?   Heart sounds: No murmur heard. ?Pulmonary:  ?   Effort: Pulmonary effort is normal. No respiratory distress.  ?   Breath sounds: Normal breath sounds. No wheezing or rales.  ?Abdominal:  ?   Soft, benign ?Musculoskeletal:     ?   General: No swelling.  ?   Comments: L sided above knee amputation present  ?Skin: ?   General: Skin is warm and  dry.  ?Neurological:  ?   General: No focal deficit present.  ?   Mental Status: She is alert.  ?Psychiatric:     ?   Mood and Affect: Mood normal.     ?   Thought Content: Thought content normal. ? ?Data Reviewed: ? ?Hemoglobin 7.6 ? ?Family Communication: Discussed with [name] at bedside and also over phone.  Discussed with Tiffany Bailey and patient's daughter over phone as well ? ?Disposition: ?Status is: Inpatient ?Remains inpatient appropriate because: Getting treated for UTI.  Family deciding on disposition plan and Foley removal with voiding trial ? ? Planned Discharge Destination: Home with Home Health ? ? ? DVT  prophylaxis-Lovenox ?Time spent: 35 minutes ? ?Author: ?Max Sane, MD ?09/01/2021 3:10 PM ? ?For on call review [email or web address].  ?

## 2021-09-01 NOTE — Assessment & Plan Note (Signed)
At baseline 

## 2021-09-01 NOTE — Hospital Course (Signed)
88 y.o. female with medical history significant for metastatic breast cancer currently engaging chemotherapy, type 2 diabetes, hypertension, PAD, CAD, CKD, history of AKA, who presents with daughters due to worsening weakness and inability to care for herself. ? ?3/18 - PT, OT recommends SNF but son does not want any SNF and wants to take her home.  Foley discontinued and waiting for voiding on her own ?

## 2021-09-01 NOTE — Assessment & Plan Note (Signed)
Blood sugar well controlled on sliding scale ?

## 2021-09-01 NOTE — Assessment & Plan Note (Signed)
Continue aspirin, statin.  

## 2021-09-01 NOTE — Assessment & Plan Note (Signed)
Has indwelling foley. Remove today and give voiding trial ?

## 2021-09-01 NOTE — Progress Notes (Signed)
Hypoglycemic Event ? ?CBG: 66 ? ?Treatment: 8 oz juice/soda ? ?Symptoms: None ? ?Follow-up CBG: [id number]  CBG Result:80 ? ?Possible Reasons for Event: Inadequate meal intake and Medication regimen:   ? ?Comments/MD notified: ?Pt verbalized that she should not have taken the medications last night without eating something; no diabetic medication adminstered from 1900-0700 on 08/31/2020 night shift per [facility]; will continue to monitor pt during this writer's shift ? ? ?Oris Drone ? ? ?

## 2021-09-01 NOTE — Assessment & Plan Note (Signed)
Continue barrier bracing and recommend ambulation and frequent position change ?

## 2021-09-01 NOTE — Evaluation (Signed)
Occupational Therapy Evaluation ?Patient Details ?Name: Tiffany Bailey ?MRN: [id number] ?DOB: 1927-06-18 ?Today's Date: 09/01/2021 ? ? ?History of Present Illness Pt is a 88 year old woman presents to ED with worsening weakness and inability to care for herself. PMH significant metastatic breast cancer currently engaging chemotherapy, type 2 diabetes, hypertension, PAD, CAD, CKD, history of AKA, who presents with daughters due to worsening weakness and inability to care for herself.  ? ?Clinical Impression ?  ?Chart reviewed to date, RN cleared pt for participation in OT evaluation Pt is greeted semi supine in bed, agreeable to evaluation. Pt is alert and oriented x4, endorses continued generalized weakness since previous admission. Pt requires significantly more assist with all ADL/IADL requiring MOD A for UB dressing, MAX A for LB dressing and functional mobility. At baseline pt is able to perform all ADL/IADL with MOD I at wheelchair level, does not wear LLE prosthetic per pt report. Per chart review pt performing functional transfers with supervision during previous admission (approx 1 week ago). Recommend discharge to STR to address functional deficits. Pt reports she plans to discharge home with son, Tiffany Bailey, who will provide assistance for all ADLs. OT will continue to follow acutely.  ?   ? ?Recommendations for follow up therapy are one component of a multi-disciplinary discharge planning process, led by the attending physician.  Recommendations may be updated based on patient status, additional functional criteria and insurance authorization.  ? ?Follow Up Recommendations ? Skilled nursing-short term rehab (<3 hours/day)  ?  ?Assistance Recommended at Discharge Frequent or constant Supervision/Assistance  ?Patient can return home with the following A lot of help with walking and/or transfers;A lot of help with bathing/dressing/bathroom;Assist for transportation;Assistance with cooking/housework;Help with stairs  or ramp for entrance;Direct supervision/assist for medications management ? ?  ?Functional Status Assessment ? Patient has had a recent decline in their functional status and demonstrates the ability to make significant improvements in function in a reasonable and predictable amount of time.  ?Equipment Recommendations ? None recommended by OT  ?  ?Recommendations for Other Services   ? ? ?  ?Precautions / Restrictions Precautions ?Precautions: Fall ?Restrictions ?Weight Bearing Restrictions: No  ? ?  ? ?Mobility Bed Mobility ?Overal bed mobility: Needs Assistance ?Bed Mobility: Sit to Supine, Supine to Sit ?  ?  ?Supine to sit: Max assist ?Sit to supine: Max assist ?  ?  ?  ? ?Transfers ?Overall transfer level: Needs assistance ?Equipment used: None ?Transfers: Sit to/from Stand ?Sit to Stand: Max assist ?  ?  ?  ?  ?  ?  ?  ? ?  ?Balance Overall balance assessment: Needs assistance ?Sitting-balance support:  (RLE supported) ?Sitting balance-Leahy Scale: Fair ?  ?  ?Standing balance support: During functional activity ?Standing balance-Leahy Scale: Zero ?  ?  ?  ?  ?  ?  ?  ?  ?  ?  ?  ?  ?   ? ?ADL either performed or assessed with clinical judgement  ? ?ADL Overall ADL's : Needs assistance/impaired ?  ?  ?  ?  ?  ?  ?  ?  ?Upper Body Dressing : Moderate assistance;Sitting ?Upper Body Dressing Details (indicate cue type and reason): gown at edge of bed ?Lower Body Dressing: Maximal assistance ?  ?Toilet Transfer: Maximal assistance;Stand-pivot ?Toilet Transfer Details (indicate cue type and reason): simulated to bedside chair ?Toileting- Clothing Manipulation and Hygiene: Total assistance ?  ?  ?  ?Functional mobility during ADLs: Maximal assistance ?General  ADL Comments: significant decrease in functional performance from previous admission  ? ? ? ?Vision Patient Visual Report: No change from baseline ?   ?   ?Perception   ?  ?Praxis   ?  ? ?Pertinent Vitals/Pain Pain Assessment ?Pain Assessment: Faces ?Faces  Pain Scale: Hurts a little bit ?Pain Location: LLE ?Pain Descriptors / Indicators: Constant ?Pain Intervention(s): Limited activity within patient's tolerance, Monitored during session, Repositioned  ? ? ? ?Hand Dominance Right ?  ?Extremity/Trunk Assessment Upper Extremity Assessment ?Upper Extremity Assessment: Generalized weakness ?  ?Lower Extremity Assessment ?Lower Extremity Assessment: Generalized weakness ?  ?Cervical / Trunk Assessment ?Cervical / Trunk Assessment: Normal ?  ?Communication Communication ?Communication: No difficulties ?  ?Cognition Arousal/Alertness: Awake/alert ?Behavior During Therapy: Flat affect ?Overall Cognitive Status: Within Functional Limits for tasks assessed ?  ?  ?  ?  ?  ?  ?  ?  ?  ?  ?  ?  ?  ?  ?  ?  ?General Comments: alert and oriented x4 ?  ?  ?General Comments  vss throughout ? ?  ?Exercises   ?  ?Shoulder Instructions    ? ? ?Home Living Family/patient expects to be discharged to:: Private residence ?Living Arrangements: Children;Other (Comment) (pt reports she will discharge home to son's) ?Available Help at Discharge: Family;Personal care attendant;Available PRN/intermittently ?Type of Home: House ?  ?  ?  ?Home Layout: One level ?  ?  ?Bathroom Shower/Tub: Tub/shower unit ?  ?Bathroom Toilet: Standard ?Bathroom Accessibility: Yes ?  ?  ?  ?Additional Comments: home set up per chart review, pt reports she has a aid for 2 hrs in the morning, son assists at night if needed, plans to discharge home with her son ?  ? ?  ?Prior Functioning/Environment Prior Level of Function : Needs assist ?  ?  ?  ?  ?  ?  ?Mobility Comments: pt reports squat pivot vs stand pivot transfer at baseline, mod i with mwc, has become weaker since previous admission requiring assitance for all mobility. ?ADLs Comments: pt requires assist for all ADLs since previous admission ?  ? ?  ?  ?OT Problem List: Decreased strength;Decreased activity tolerance;Impaired balance (sitting and/or  standing);Decreased safety awareness;Decreased knowledge of use of DME or AE ?  ?   ?OT Treatment/Interventions: Self-care/ADL training;Therapeutic exercise;DME and/or AE instruction;Balance training;Manual therapy;Patient/family education;Energy conservation;Therapeutic activities  ?  ?OT Goals(Current goals can be found in the care plan section) Acute Rehab OT Goals ?Patient Stated Goal: go home ?OT Goal Formulation: With patient ?Time For Goal Achievement: 09/15/21 ?Potential to Achieve Goals: Fair  ?OT Frequency: Min 2X/week ?  ? ?Co-evaluation   ?  ?  ?  ?  ? ?  ?AM-PAC OT "6 Clicks" Daily Activity     ?Outcome Measure Help from another person eating meals?: None ?Help from another person taking care of personal grooming?: A Little ?Help from another person toileting, which includes using toliet, bedpan, or urinal?: A Lot ?Help from another person bathing (including washing, rinsing, drying)?: A Lot ?Help from another person to put on and taking off regular upper body clothing?: A Lot ?Help from another person to put on and taking off regular lower body clothing?: A Lot ?6 Click Score: 15 ?  ?End of Session Nurse Communication: Mobility status ? ?Activity Tolerance: Patient tolerated treatment well ?Patient left: in bed;with call bell/phone within reach;with bed alarm set ? ?OT Visit Diagnosis: Unsteadiness on feet (R26.81);Repeated falls (R29.6);Muscle weakness (generalized) (  M62.81)  ?              ?Time: 9449-6759 ?OT Time Calculation (min): 20 min ?Charges:  OT General Charges ?$OT Visit: 1 Visit ?OT Evaluation ?$OT Eval High Complexity: 1 High ? ?Shanon Payor, OTD OTR/L  ?09/01/21, 1:37 PM  ?

## 2021-09-01 NOTE — Progress Notes (Signed)
Clinical/Bedside Swallow Evaluation ?Patient Details  ?Name: Tiffany Bailey ?MRN: [id number] ?Date of Birth: 06-13-1928 ? ?Today's Date: 09/01/2021 ?Time: SLP Start Time (ACUTE ONLY): 7353 SLP Stop Time (ACUTE ONLY): 0930 ?SLP Time Calculation (min) (ACUTE ONLY): 33 min ? ?Past Medical History:  ?Past Medical History:  ?Diagnosis Date  ? Anemia   ? Anginal pain ([place])   ? Arthritis   ? Breast Bailey ([place]) 07/19/2019  ? CKD (chronic kidney disease), stage III ([place])   ? Coronary artery disease   ? a. 2002 CABG x 3 (LIMA-LAD, SVG-D1, SVG-RCA); b. s/p multiple PCI's.  ? Diabetes mellitus without complication ([place])   ? Diastolic dysfunction   ? a. 12/2017 Echo: EF 55-60%, no rwma, Gr1 DD. Triv AI. Mild-mod MR/TR. PASP 13mHg.  ? Edema   ? Failure to thrive in adult   ? [place]ecal impaction ([place] 08/23/2021  ? GERD (gastroesophageal reflux disease)   ? Hb-SS disease without crisis ([place] 10/23/2017  ? History of breast Bailey   ? HOH (hard of hearing)   ? Hyperlipidemia   ? Hypertension   ? Myocardial infarction ([facility]   ? PAD (peripheral artery disease) ([place]   ? a. s/p L AKA; b. 12/2018 PTA R peroneal & PTA/Stenting R SFA.  ? Palpitations   ? ?Past Surgical History:  ?Past Surgical History:  ?Procedure Laterality Date  ? ABDOMINAL HYSTERECTOMY    ? ABOVE KNEE LEG AMPUTATION Left 2013  ? ANTERIOR VITRECTOMY Left 11/16/2015  ? Procedure: ANTERIOR VITRECTOMY;  Surgeon: BEulogio Bear MD;  Location: [facility] ORS;  Service: Ophthalmology;  Laterality: Left;  ? BLADDER SURGERY    ? CATARACT EXTRACTION W/PHACO Left 11/16/2015  ? Procedure: CATARACT EXTRACTION PHACO AND INTRAOCULAR LENS PLACEMENT (IOC);  Surgeon: BEulogio Bear MD;  Location: [facility] ORS;  Service: Ophthalmology;  Laterality: Left;  Lot # [id number] ?[place] 01:22.8 ?AP%:12.6 ?CDE: 10.46  ? CATARACT EXTRACTION W/PHACO Right 08/22/2016  ? Procedure: CATARACT EXTRACTION PHACO AND INTRAOCULAR LENS PLACEMENT (IOC);  Surgeon: BEulogio Bear MD;  Location: [facility] ORS;  Service:  Ophthalmology;  Laterality: Right;  Lot # [id number] ?[place] 00:52.1 ?AP%:8.7 ?CDE: 4.99 ?  ? CORONARY ANGIOPLASTY    ? STENT  ? CORONARY ARTERY BYPASS GRAFT    ? LOWER EXTREMITY ANGIOGRAPHY Right 01/14/2019  ? Procedure: LOWER EXTREMITY ANGIOGRAPHY;  Surgeon: DAlgernon Huxley MD;  [facility];  Service: Cardiovascular;  Laterality: Right;  ? MASTECTOMY    ? TUMOR REMOVAL    ? ABDOMINAL  ? ?HPI:  ?HMACKAYLA MULLINSis a 88y.o. female with medical history significant for Tiffany Bailey currently engaging chemotherapy, type 2 diabetes, hypertension, PAD, CAD, CKD, history of AKA who presented with weakness and decline in self-care abilities admitted with UTI and physical deconditioning. CT head showed no acute findings, CXR: "Right perihilar airspace opacity, new/progressed from prior chest  imaging. This may represent pneumonia in the appropriate clinical  setting." Referred for swallowing evaluation.  ?  ?Assessment / Plan / Recommendation  ?Clinical Impression ?  Patient presents with signs of oropharyngeal dysphagia, suspect secondary to weakness, dentition. Oral motor assessment is noted for mild generalized oral/lingual weakness. Pt consuming breakfast when SLP arrived; noted with left sided pocketing, prolonged mastication. Pt verbalizes, "It takes me a long time to chew." She sipped thin liquids without overt signs of aspiration, however when she retained solids she did have immediate cough x1, suggestive of reduced airway protection. Suspect discoordination/premature spillage due to primary oral dysphagia.  Pt able to follow SLP recommendations to clear oral cavity prior to drinking liquids with softer solids (pancakes). Recommend downgrade to mechanical soft solids (dysphagia 3), with meats chopped/moistened to reduce efforts of mastication. If pt exhibits difficulty with pills, can give whole in puree for safer swallowing. Patient able to reduce aspiration risk by following aspiration  precautions: upright 90 degrees, slow rate, small bites and sips, clear oral cavity prior to taking additional bites/sips. SLP to follow up briefly for tolerance.  ? ?SLP Visit Diagnosis: Dysphagia, oropharyngeal phase (R13.12) ?   ?Aspiration Risk ? Mild aspiration risk;Moderate aspiration risk  ?  ?Diet Recommendation Thin liquid;Dysphagia 3 (Mech soft) (chopped/moistened meats)  ? ?Liquid Administration via: Cup;Straw ?Medication Administration: Whole meds with puree ?Supervision: Patient able to self feed;Comment (set up/position 90 degrees prior to meals) ?Compensations: Small sips/bites;Slow rate;Minimize environmental distractions ?Postural Changes: Seated upright at 90 degrees;Remain upright for at least 30 minutes after po intake  ?  ?Other  Recommendations Oral Care Recommendations: Oral care BID   ? ?Recommendations for follow up therapy are one component of a multi-disciplinary discharge planning process, led by the attending physician.  Recommendations may be updated based on patient status, additional functional criteria and insurance authorization. ? ?Follow up Recommendations   tbd ? ? ?  ?Assistance Recommended at Discharge Set up Supervision/Assistance  ?Functional Status Assessment Patient has had a recent decline in their functional status and demonstrates the ability to make significant improvements in function in a reasonable and predictable amount of time.  ?Frequency and Duration min 1 x/week  ?2 weeks ?  ?   ? ?Prognosis Prognosis for Safe Diet Advancement: Fair  ? ?  ? ?Swallow Study   ?General Date of Onset: 09/01/21 ?HPI: Tiffany Bailey is a 88 y.o. female with medical history significant for Tiffany Bailey currently engaging chemotherapy, type 2 diabetes, hypertension, PAD, CAD, CKD, history of AKA who presented with weakness and decline in self-care abilities admitted with UTI and physical deconditioning. CT head showed no acute findings, CXR: "Right perihilar airspace  opacity, new/progressed from prior chest  imaging. This may represent pneumonia in the appropriate clinical  setting." Referred for swallowing evaluation. ?Type of Study: Bedside Swallow Evaluation ?Previous Swallow Assessment: none in chart ?Diet Prior to this Study: Regular;Thin liquids ?Temperature Spikes Noted: Yes (99.9) ?Respiratory Status: Room air ?History of Recent Intubation: No ?Behavior/Cognition: Alert;Cooperative;Pleasant mood ?Oral Cavity Assessment: Other (comment) (lips cracked, bleeding) ?Oral Care Completed by SLP: No ?Oral Cavity - Dentition: Missing dentition (missing many top teeth) ?Vision: Functional for self-feeding ?Self-Feeding Abilities: Able to feed self ?Patient Positioning: Upright in bed ?Baseline Vocal Quality: Normal;Low vocal intensity ?Volitional Swallow: Able to elicit  ?  ?Oral/Motor/Sensory Function     ?Ice Chips Ice chips: Not tested   ?Thin Liquid Thin Liquid: Impaired ?Presentation: Cup;Self Fed ?Pharyngeal  Phase Impairments: Cough - Immediate (x1, when still masticating solids)  ?  ?Nectar Thick Nectar Thick Liquid: Not tested   ?Honey Thick Honey Thick Liquid: Not tested   ?Puree Puree: Within functional limits ?Presentation: Self Fed;Spoon   ?Solid ? ? ?  Solid: Impaired ?Presentation: Self Fed (fork, hand) ?Oral Phase Impairments: Impaired mastication ?Oral Phase Functional Implications: Impaired mastication;Oral residue;Left lateral sulci pocketing  ? ?  ?Deneise Lever, MS, CCC-SLP ?Speech-Language Pathologist ?[phone] ? ?Aliene Altes ?09/01/2021,9:31 AM ? ? ? ? ?

## 2021-09-01 NOTE — Evaluation (Signed)
Physical Therapy Evaluation ?Patient Details ?Name: Tiffany Bailey ?MRN: [id number] ?DOB: 1928-05-14 ?Today's Date: 09/01/2021 ? ?History of Present Illness ? Pt is a 88 year old woman presents to ED with worsening weakness and inability to care for herself. PMH significant metastatic breast cancer currently engaging chemotherapy, type 2 diabetes, hypertension, PAD, CAD, CKD, history of L AKA, who presents with daughters due to worsening weakness and inability to care for herself. ?  ?Clinical Impression ? Patient presents sitting up in bed eating lunch. She agrees to get up with therapy to chair to continue eating. PT assisted by OT for safety/line management. History obtained through chart review of recent stay and discussion with patient. Patient uses w/c for ambulation at baseline and 1 week ago was completing bed mobility and transfers with supervision. She has most recently been living with her son, who wishes to take her back home. Upon PT evaluation, patient has declined since last week to needing max A for bed mobility and stand pivot transfer bed to chair, +2 for line management/safety. Patient has demonstrated a significant decline in functional mobility and will require heavy assistance for care and mobility. Patient would benefit from short term rehab before returning home to improve functional mobility and independence for decreased caregiver burden and improved safety. Patient appears to have appropriate equipment at home but lacks strength and balance needed to meet her basic needs without heavy assist. Patient would benefit from skilled physical therapy to address impairments and functional limitations (see PT Problem List below) to work towards stated goals and return to PLOF or maximal functional independence.  ? ?   ? ?Recommendations for follow up therapy are one component of a multi-disciplinary discharge planning process, led by the attending physician.  Recommendations may be updated based on  patient status, additional functional criteria and insurance authorization. ? ?Follow Up Recommendations Skilled nursing-short term rehab (<3 hours/day) ? ?  ?Assistance Recommended at Discharge Intermittent Supervision/Assistance  ?Patient can return home with the following ? Assistance with cooking/housework;Assist for transportation;Help with stairs or ramp for entrance;Two people to help with walking and/or transfers;Two people to help with bathing/dressing/bathroom ? ?  ?Equipment Recommendations None recommended by PT  ?Recommendations for Other Services ?    ?  ?Functional Status Assessment Patient has had a recent decline in their functional status and/or demonstrates limited ability to make significant improvements in function in a reasonable and predictable amount of time  ? ?  ?Precautions / Restrictions Precautions ?Precautions: Fall ?Restrictions ?Weight Bearing Restrictions: No  ? ?  ? ?Mobility ? Bed Mobility ?Overal bed mobility: Needs Assistance ?Bed Mobility: Supine to Sit ?  ?  ?Supine to sit: Max assist ?  ?  ?General bed mobility comments: patient comes supine to sit with max A with support at trunk and legs ?  ? ?Transfers ?Overall transfer level: Needs assistance ?Equipment used: None ?Transfers: Bed to chair/wheelchair/BSC ?  ?Stand pivot transfers: +2 safety/equipment, Max assist ?  ?  ?  ?  ?General transfer comment: Patient completed stand pivot transfer towards right with max A. +2 (PT/OT) present for safety/line management. R knee blocked. ?  ? ?Ambulation/Gait ?  ?  ?  ?  ?  ?  ?  ?General Gait Details: deferred (pt non-ambulatory at baseline) ? ?Stairs ?  ?  ?  ?  ?  ? ?Wheelchair Mobility ?  ? ?Modified Rankin (Stroke Patients Only) ?  ? ?  ? ?Balance Overall balance assessment: Needs assistance ?Sitting-balance support:  Bilateral upper extremity supported (RLE supported) ?Sitting balance-Leahy Scale: Fair ?Sitting balance - Comments: close supervision for sitting edge of bed ?   ?Standing balance support: During functional activity ?Standing balance-Leahy Scale: Zero ?Standing balance comment: patient max A pivot transfer with R knee blocked, unable to ambulate ?  ?  ?  ?  ?  ?  ?  ?  ?  ?  ?  ?   ? ? ? ?Pertinent Vitals/Pain Pain Assessment ?Pain Assessment: Faces ?Faces Pain Scale: No hurt  ? ? ?Home Living Family/patient expects to be discharged to:: Private residence ?Living Arrangements: Children;Other (Comment) (pt reports she will discharge home to son's) ?Available Help at Discharge: Family;Personal care attendant;Available PRN/intermittently ?Type of Home: House ?Home Access: Ramped entrance ?  ?  ?  ?Home Layout: One level ?Home Equipment: Rolling Walker (2 wheels);BSC/3in1;Wheelchair - manual;Shower seat;Grab bars - tub/shower;Grab bars - toilet ?Additional Comments: home set up per chart review, pt reports she has a aid for 2 hrs in the morning, son assists at night if needed, plans to discharge home with her son  ?  ?Prior Function Prior Level of Function : Needs assist ?  ?  ?  ?  ?  ?  ?Mobility Comments: pt reports squat pivot vs stand pivot transfer at baseline, mod i with mwc, has become weaker since previous admission requiring assitance for all mobility. ?ADLs Comments: pt requires assist for all ADLs since previous admission ?  ? ? ?Hand Dominance  ? Dominant Hand: Right ? ?  ?Extremity/Trunk Assessment  ? Upper Extremity Assessment ?Upper Extremity Assessment: Generalized weakness ?  ? ?Lower Extremity Assessment ?Lower Extremity Assessment: Generalized weakness;LLE deficits/detail ?LLE Deficits / Details: mature transfemoral amputation, patient does not wear prosthesis per her report ?  ? ?Cervical / Trunk Assessment ?Cervical / Trunk Assessment: Normal  ?Communication  ? Communication: No difficulties  ?Cognition   ?  ?  ?  ?  ?  ?  ?  ?  ?  ?  ?  ?  ?  ?  ?  ?  ?  ?  ?  ?  ?  ? ?  ?General Comments General comments (skin integrity, edema, etc.): vss throughout ? ?   ?Exercises    ? ?Assessment/Plan  ?  ?PT Assessment Patient needs continued PT services  ?PT Problem List Decreased strength;Decreased activity tolerance;Decreased mobility;Decreased balance ? ?   ?  ?PT Treatment Interventions DME instruction;Functional mobility training;Therapeutic activities;Therapeutic exercise;Balance training;Patient/family education   ? ?PT Goals (Current goals can be found in the Care Plan section)  ?Acute Rehab PT Goals ?Patient Stated Goal: to go home with son, [name] ?PT Goal Formulation: With patient/family ?Time For Goal Achievement: 09/15/21 ?Potential to Achieve Goals: Fair ? ?  ?Frequency Min 2X/week ?  ? ? ?Co-evaluation   ?  ?  ?  ?  ? ? ?  ?AM-PAC PT "6 Clicks" Mobility  ?Outcome Measure Help needed turning from your back to your side while in a flat bed without using bedrails?: A Lot ?Help needed moving from lying on your back to sitting on the side of a flat bed without using bedrails?: A Lot ?Help needed moving to and from a bed to a chair (including a wheelchair)?: Total ?Help needed standing up from a chair using your arms (e.g., wheelchair or bedside chair)?: Total ?Help needed to walk in hospital room?: Total ?Help needed climbing 3-5 steps with a railing? : Total ?6 Click Score: 8 ? ?  ?  End of Session   ?Activity Tolerance: Patient tolerated treatment well ?Patient left: in chair;with family/visitor present;with call bell/phone within reach;with chair alarm set ?Nurse Communication: Mobility status ?PT Visit Diagnosis: Other abnormalities of gait and mobility (R26.89);Muscle weakness (generalized) (M62.81) ?  ? ?Time: 2233-6122 ?PT Time Calculation (min) (ACUTE ONLY): 15 min ? ? ?Charges:   PT Evaluation ?$PT Eval Low Complexity: 1 Low ?  ?  ?   ? ? ?Everlean Alstrom. Graylon Good, PT, DPT ?09/01/21, 2:09 PM ? ? ?

## 2021-09-02 DIAGNOSIS — Z89612 Acquired absence of left leg above knee: Secondary | ICD-10-CM

## 2021-09-02 DIAGNOSIS — N390 Urinary tract infection, site not specified: Secondary | ICD-10-CM

## 2021-09-02 DIAGNOSIS — I129 Hypertensive chronic kidney disease with stage 1 through stage 4 chronic kidney disease, or unspecified chronic kidney disease: Secondary | ICD-10-CM

## 2021-09-02 DIAGNOSIS — L89152 Pressure ulcer of sacral region, stage 2: Secondary | ICD-10-CM

## 2021-09-02 DIAGNOSIS — I739 Peripheral vascular disease, unspecified: Secondary | ICD-10-CM

## 2021-09-02 DIAGNOSIS — R6251 Failure to thrive (child): Secondary | ICD-10-CM

## 2021-09-02 DIAGNOSIS — R339 Retention of urine, unspecified: Secondary | ICD-10-CM

## 2021-09-02 DIAGNOSIS — I2510 Atherosclerotic heart disease of native coronary artery without angina pectoris: Secondary | ICD-10-CM

## 2021-09-02 LAB — BASIC METABOLIC PANEL
Anion gap: 5 (ref 5–15)
BUN: 32 mg/dL — ABNORMAL HIGH (ref 8–23)
CO2: 19 mmol/L — ABNORMAL LOW (ref 22–32)
Calcium: 8.6 mg/dL — ABNORMAL LOW (ref 8.9–10.3)
Chloride: 110 mmol/L (ref 98–111)
Creatinine, Ser: 1.34 mg/dL — ABNORMAL HIGH (ref 0.44–1.00)
GFR, Estimated: 37 mL/min — ABNORMAL LOW (ref >60)
Glucose, Bld: 186 mg/dL — ABNORMAL HIGH (ref 70–99)
Potassium: 5.2 mmol/L — ABNORMAL HIGH (ref 3.5–5.1)
Sodium: 134 mmol/L — ABNORMAL LOW (ref 135–145)

## 2021-09-02 LAB — CBC
HCT: 23.6 % — ABNORMAL LOW (ref 36.0–46.0)
Hemoglobin: 7.8 g/dL — ABNORMAL LOW (ref 12.0–15.0)
MCH: 31.2 pg (ref 26.0–34.0)
MCHC: 33.1 g/dL (ref 30.0–36.0)
MCV: 94.4 fL (ref 80.0–100.0)
Platelets: 302 10*3/uL (ref 150–400)
RBC: 2.5 MIL/uL — ABNORMAL LOW (ref 3.87–5.11)
RDW: 14.3 % (ref 11.5–15.5)
WBC: 11.6 10*3/uL — ABNORMAL HIGH (ref 4.0–10.5)
nRBC: 0 % (ref 0.0–0.2)

## 2021-09-02 LAB — GLUCOSE, CAPILLARY: Glucose-Capillary: 153 mg/dL — ABNORMAL HIGH (ref 70–99)

## 2021-09-02 MED ORDER — CEPHALEXIN 250 MG PO CAPS
250.0000 mg | ORAL_CAPSULE | Freq: Two times a day (BID) | ORAL | 0 refills | Status: AC
Start: 2021-09-02 — End: 2021-09-04

## 2021-09-02 MED ORDER — FUROSEMIDE 10 MG/ML IJ SOLN
20.0000 mg | INTRAMUSCULAR | Status: AC
  Administered 2021-09-02: 20 mg via INTRAVENOUS
  Filled 2021-09-02: qty 2

## 2021-09-02 NOTE — Discharge Summary (Signed)
?Physician Discharge Summary ?  ?Patient: Tiffany Bailey MRN: [id number] DOB: 1928/05/25  ?Admit date:     08/31/2021  ?Discharge date: 09/02/21  ?Discharge Physician: Max Sane  ? ?PCP: [name], FNP  ? ?Recommendations at discharge:  ? ?Follow-up with outpatient providers as requested ? ?Discharge Diagnoses: ?Principal Problem: ?  UTI (urinary tract infection) ?Active Problems: ?  Urinary retention ?  DM (diabetes mellitus) ([place]) ?  Essential hypertension ?  CAD (coronary artery disease), native coronary artery ?  History of breast cancer ?  CKD stage 3 due to type 2 diabetes mellitus ([place]) ?  PAD (peripheral artery disease) ([place]) ?  Hx of AKA (above knee amputation), left ([place]) ?  Hb-SS disease without crisis ([place]) ?  Pressure injury of sacral region, stage 2 ([place]) ?  Physical deconditioning ?  Failure to thrive (child) ? ? ?Hospital Course: ?88 y.o. female with medical history significant for metastatic breast cancer currently engaging chemotherapy, type 2 diabetes, hypertension, PAD, CAD, CKD, history of AKA, who presents with daughters due to worsening weakness and inability to care for herself. ? ?3/18 - PT, OT recommends SNF but son does not want any SNF and wants to take her home.  Foley discontinued and waiting for voiding on her own ? ?Assessment and Plan: ?* UTI (urinary tract infection) ?May be mild based on UA.  Treated with IV Rocephin while in the hospital and discharged on oral Keflex.  Urine culture was never sent ? ?Urinary retention ?Patient came in with a Foley which she had in place for last 10 days according to son.  Patient's Foley was removed on this admission and she was able to void without any difficulty ? ?Physical deconditioning ?Patient with profound decline in ability to care for self over the past 3 weeks.  Unclear if this is multifactorial end-stage decline or possibly related to UTI, strongly suspect the former.  I had conversation with patient's son Tiffany Bailey earlier in the  morning who was in agreement for hospice at home.  Later her other son Tiffany Bailey came who refuted what Tiffany Bailey said.  Tiffany Bailey mention patient staying with her and him making the decision regarding her care.  He feels this is transient decline in her clinical condition including ambulation and expects full recovery.  PT and OT recommends SNF Tiffany Bailey refuses and wants to take her home with [facility] PT and OT. ? ?Tiffany Bailey feels her difficulty ambulation has to do with her having Foley and being in the hospital.  I have requested Foley removal and voiding trial after which we can consider discharge.  He is in agreement.  I had discussion with him at the bedside and also over the phone.  I had also discussed with patient's daughter over the phone.  They all want Tiffany Bailey to make decision.  Patient is set up with home health PT and OT and palliative care.  If she does not do well I recommend transition to hospice ? ?Pressure injury of sacral region, stage 2 ([place]) ?Continue barrier bracing and recommend ambulation and frequent position change ? ?Hx of AKA (above knee amputation), left ([place]) ?PT, OT recommends SNF but son does not want that and requested home health PT OT.  He is wanting to take her home today ? ?PAD (peripheral artery disease) ([place]) ?Continue aspirin, statin ? ?CKD stage 3 due to type 2 diabetes mellitus ([place]) ?At baseline ? ?CAD (coronary artery disease), native coronary artery ?Continue asa, statin. Metoprolol, norvasc ? ?Essential hypertension ?  Continue Norvasc, clonidine, Avapro and metoprolol ? ?DM (diabetes mellitus) ([place]) ?Blood sugar well controlled on sliding scale ? ? ? ? ?  ? ? ? ?Disposition: Home health PT, OT and palliative care to follow.  Strongly recommend transition to hospice if and when she is appropriate and family in agreement ?Diet recommendation:  ?Discharge Diet Orders (From admission, onward)  ? ?  Start     Ordered  ? 09/02/21 0000  Diet - low sodium heart healthy       ? 09/02/21 0932  ? ?  ?  ? ?   ? ?Carb modified diet ?DISCHARGE MEDICATION: ?Allergies as of 09/02/2021   ? ?   Reactions  ? Amoxicillin Hives, Nausea Only  ? Gabapentin Other (See Comments)  ? AMS ?Confusion  ? Mirtazapine Itching  ? Dry mouth with ODT ?Denies SOB with medication  ? Other   ? UNKNOWN PAIN MEDICATION - unknown reaction  ? ?  ? ?  ?Medication List  ?  ? ?STOP taking these medications   ? ?ondansetron 4 MG tablet ?Commonly known as: Zofran ?  ?TOUJEO SOLOSTAR T[place] ?  ? ?  ? ?TAKE these medications   ? ?amLODipine 10 MG tablet ?Commonly known as: NORVASC ?Take 1 tablet (10 mg total) by mouth daily. ?  ?anastrozole 1 MG tablet ?Commonly known as: ARIMIDEX ?Take 1 mg by mouth daily. ?  ?aspirin EC 81 MG tablet ?Take 1 tablet (81 mg total) by mouth daily. ?  ?atorvastatin 40 MG tablet ?Commonly known as: LIPITOR ?TAKE 1 TABLET BY MOUTH  DAILY ?  ?BD Pen Needle Nano U/F 32G X 4 MM Misc ?Generic drug: Insulin Pen Needle ?USE 1 DAILY ?  ?blood glucose meter kit and supplies Kit ?Dispense based on patient and insurance preference. Use up to four times daily as directed. (FOR ICD-9 250.00, 250.01). ?  ?cephALEXin 250 MG capsule ?Commonly known as: KEFLEX ?Take 1 capsule (250 mg total) by mouth 2 (two) times daily for 2 days. ?  ?cloNIDine 0.1 MG tablet ?Commonly known as: CATAPRES ?Take 1 tablet (0.1 mg total) by mouth 2 (two) times daily. ?  ?clopidogrel 75 MG tablet ?Commonly known as: PLAVIX ?TAKE 1 TABLET BY MOUTH  DAILY ?  ?Dulcolax 5 MG EC tablet ?Generic drug: bisacodyl ?Take 1 tablet (5 mg total) by mouth daily as needed for moderate constipation. ?  ?feeding supplement Liqd ?Take 237 mLs by mouth 2 (two) times daily between meals. ?  ?FreeStyle [place] 2 Reader Kerrin Mo ?Use to check blood sugar continuously ?  ?FreeStyle Libre 2 Sensor Misc ?Apply 1 each topically every 14 (fourteen) days. ?  ?glucose blood test strip ?Commonly known as: ONE TOUCH TEST STRIPS ?Use to test blood sugar twice daily ?  ?hydrALAZINE 10 MG tablet ?Commonly  known as: APRESOLINE ?Take 1 tablet (10 mg total) by mouth in the morning and at bedtime. ?  ?megestrol 625 MG/5ML suspension ?Commonly known as: MEGACE ES ?Take 5 mLs (625 mg total) by mouth daily. ?  ?Metoprolol Tartrate 37.5 MG Tabs ?Take 37.5 mg by mouth 2 (two) times daily. ?  ?nitroGLYCERIN 0.4 MG SL tablet ?Commonly known as: NITROSTAT ?Place 1 tablet (0.4 mg total) under the tongue every 5 (five) minutes as needed for chest pain. ?  ?onetouch ultrasoft lancets ?1 each by Other route 2 (two) times daily. 90 day supply for [employer].  Dx E11.9 ?  ?OneTouch Verio Reflect w/Device Kit ?1 Device by Does not apply route  daily. ?  ?polyethylene glycol 17 g packet ?Commonly known as: MIRALAX / GLYCOLAX ?Take 17 g by mouth daily. ?  ?senna 8.6 MG Tabs tablet ?Commonly known as: SENOKOT ?Take 1 tablet (8.6 mg total) by mouth at bedtime. ?  ?valsartan 160 MG tablet ?Commonly known as: Diovan ?Take 1 tablet (160 mg total) by mouth daily. ?  ?vitamin C 250 MG tablet ?Commonly known as: ASCORBIC ACID ?Take 250 mg by mouth daily. ?  ? ?  ? ?  ?  ? ? ?  ?Discharge Care Instructions  ?(From admission, onward)  ?  ? ? ?  ? ?  Start     Ordered  ? 09/02/21 0000  Discharge wound care:       ?Comments: Barrier dressing every other day  ? 09/02/21 0852  ? ?  ?  ? ?  ? ? Follow-up Information   ? ? [name], FNP. Schedule an appointment as soon as possible for a visit in 3 day(s).   ?Specialty: Family Medicine ?Why: [facility] Discharge F/UP ?Contact information: ?[address] ?[address] ?[place] [address] ?[phone] ? ? ?  ?  ? ?  ?  ? ?  ? ?Discharge Exam: ?There were no vitals filed for this visit. ?General: She is not in acute distress. ?   Eyes:  ?   General: No scleral icterus. ?Cardiovascular:  ?   Rate and Rhythm: Normal rate and regular rhythm.  ?   Heart sounds: No murmur heard. ?Pulmonary:  ?   Effort: Pulmonary effort is normal. No respiratory distress.  ?   Breath sounds: Normal  breath sounds. No wheezing or rales.  ?Abdominal:  ?   Soft, benign ?Musculoskeletal:     ?   General: No swelling.  ?   Comments: L sided above knee amputation present  ?Skin: ?   General: Skin is warm and dry.  ?

## 2021-09-02 NOTE — Plan of Care (Signed)

## 2021-09-02 NOTE — Progress Notes (Signed)
MD order received in [facility] to discharge pt home with home health today; TOC previously established home health services with [facility]; BSC previously delivered to the pt's room; verbally reviewed AVS with pt and pt's son, [name], no questions voiced at this time; pt's discharge pending her getting dressed and taken to the [place] entrance ?

## 2021-09-02 NOTE — Discharge Instructions (Addendum)
[place] ?

## 2021-09-02 NOTE — Progress Notes (Signed)
MD order received in [facility] to discharge pt home with home health today; TOC previously established home health services with [facility], DME/3n1 previously delivered to the pt's room; verbally reviewed AVS with pt and pt's son, Cuba Natarajan; no questions voiced at this time; pt discharged via wheelchair by the nursing tech to the medical mall entrance ?

## 2021-09-02 NOTE — TOC Transition Note (Addendum)
Transition of Care (TOC) - CM/SW Discharge Note ? ? ?Patient Details  ?Name: TAELYNN MCELHANNON ?MRN: [id number] ?Date of Birth: 1927-11-08 ? ?Transition of Care (TOC) CM/SW Contact:  ?Izola Price, RN ?Phone Number: ?09/02/2021, 10:11 AM ? ? ?Clinical Narrative: Patient discharged to home with [facility] via [facility] per Malachy Mood previously, but was notified and confirmed today. Son picked up patient for transport to home. No DME orders noted but was inadvertently given a charity 3:1 designated for another patient by a NT, who thought she needed it for discharge.  ?Simmie Davies RN CM   ? ? ? ?Final next level of care: [place] ?Barriers to Discharge: Barriers Resolved ? ? ?Patient Goals and CMS Choice ?  ?  ?  ? ?Discharge Placement ?  ?           ?  ?  ?  ?  ? ?Discharge Plan and Services ?  ?  ?           ?  ?  ?  ?  ?  ?HH Arranged: RN, PT, OT ?[place] Agency: [place] ?  ?  ?  ? ?Social Determinants of Health (SDOH) Interventions ?  ? ? ?Readmission Risk Interventions ?No flowsheet data found. ? ? ? ? ?

## 2021-09-03 ENCOUNTER — Telehealth: Payer: Self-pay

## 2021-09-03 NOTE — Progress Notes (Signed)
? ?09/04/21 ?10:32 AM  ? ?Tiffany Bailey ?04/29/1928 ?[id number] ? ?Referring provider:  ?[name], Margaret G, FNP ?[address] ?[address] ?[place],  [place] [address] ?Chief Complaint  ?Patient presents with  ? Urinary Retention  ? ? ? ?HPI: ?Tiffany Bailey is a 88 y.o.female who presents today for a voiding trial and follow-up after hospital encounter.  ? ?She has a personal history of metastatic breast cancer currently on chemotherapy and CKD.  ? ?She was admitted to the hospital on 08/23/2021 for constipation for the last few months , for the past 7 days she developed another episode of constipation despite taking Linzess Tiffany. She has periumbilical discomfort mild nausea but no vomiting. She had required multiple in/out catheterizations during admission with retained volumes as high as 1 L. She was places with a Foley.  ? ?She was readmitted into the hospital on 08/31/2021, she was referred to the hospital by her PCP who she saw earlier this day with concern with acute changes in her overall health. She has a significant decline in her ability to care for herself for the past 3 weeks and pain with urination. In the ED vital signs unremarkable.  BMP at her baseline without any major abnormalities, CBC showed mildly worse chronic anemia at 8.6 but otherwise also at her baseline.  UA showed large leuks, negative nitrite.  CT abdomen and pelvis visualized right renal cysts and multiple small calcifications in both kidneys suggesting nonobstructing renal stones and possibly calcifications in the renal artery branches. She was given 2 g ceftriaxone for empiric treatment of UTI and admitted for further management. Her foley catheter was removed and she was able to void.  ? ?She is accompanied by a family member. They believe her infection was after her catheter was placed. She denies history of retention. She reports that she is taking Linzess for constipation.  ? ?She denies any dysuria or gross hematuria.  She feels  like she is emptying her bladder well today. ? ?PMH: ?Past Medical History:  ?Diagnosis Date  ? Anemia   ? Anginal pain (HCC)   ? Arthritis   ? Breast cancer (HCC) 07/19/2019  ? CKD (chronic kidney disease), stage III (HCC)   ? Coronary artery disease   ? a. 2002 CABG x 3 (LIMA-LAD, SVG-D1, SVG-RCA); b. s/p multiple PCI's.  ? Diabetes mellitus without complication (HCC)   ? Diastolic dysfunction   ? a. 12/2017 Echo: EF 55-60%, no rwma, Gr1 DD. Triv AI. Mild-mod MR/TR. PASP 44mmHg.  ? Edema   ? Failure to thrive in adult   ? Fecal impaction (HCC) 08/23/2021  ? GERD (gastroesophageal reflux disease)   ? Hb-SS disease without crisis (HCC) 10/23/2017  ? History of breast cancer   ? HOH (hard of hearing)   ? Hyperlipidemia   ? Hypertension   ? Myocardial infarction (HCC)   ? PAD (peripheral artery disease) (HCC)   ? a. s/p L AKA; b. 12/2018 PTA R peroneal & PTA/Stenting R SFA.  ? Palpitations   ? ? ?Surgical History: ?Past Surgical History:  ?Procedure Laterality Date  ? ABDOMINAL HYSTERECTOMY    ? ABOVE KNEE LEG AMPUTATION Left 2013  ? ANTERIOR VITRECTOMY Left 11/16/2015  ? Procedure: ANTERIOR VITRECTOMY;  Surgeon: Bradley Mark King, MD;  Location: [facility] ORS;  Service: Ophthalmology;  Laterality: Left;  ? BLADDER SURGERY    ? CATARACT EXTRACTION W/PHACO Left 11/16/2015  ? Procedure: CATARACT EXTRACTION PHACO AND INTRAOCULAR LENS PLACEMENT (IOC);  Surgeon: Bradley Mark King,   MD;  Location: [facility] ORS;  Service: Ophthalmology;  Laterality: Left;  Lot # [id number] ?[place]; 01:22.8 ?AP%:12.6 ?CDE: 10.46  ? CATARACT EXTRACTION W/PHACO Right 08/22/2016  ? Procedure: CATARACT EXTRACTION PHACO AND INTRAOCULAR LENS PLACEMENT (IOC);  Surgeon: Eulogio Bear, MD;  Location: [facility] ORS;  Service: Ophthalmology;  Laterality: Right;  Lot # [id number] ?[place]: 00:52.1 ?AP%:8.7 ?CDE: 4.99 ?  ? CORONARY ANGIOPLASTY    ? STENT  ? CORONARY ARTERY BYPASS GRAFT    ? LOWER EXTREMITY ANGIOGRAPHY Right 01/14/2019  ? Procedure: LOWER EXTREMITY ANGIOGRAPHY;  Surgeon:  Algernon Huxley, MD;  Location: [place] [facility];  Service: Cardiovascular;  Laterality: Right;  ? MASTECTOMY    ? TUMOR REMOVAL    ? ABDOMINAL  ? ? ?Home Medications:  ?Allergies as of 09/04/2021   ? ?   Reactions  ? Amoxicillin Hives, Nausea Only  ? Gabapentin Other (See Comments)  ? AMS ?Confusion  ? Mirtazapine Itching  ? Dry mouth with ODT ?Denies SOB with medication  ? Other   ? UNKNOWN PAIN MEDICATION - unknown reaction  ? ?  ? ?  ?Medication List  ?  ? ?  ? Accurate as of September 04, 2021 10:32 AM. If you have any questions, ask your nurse or doctor.  ?  ?  ? ?  ? ?amLODipine 10 MG tablet ?Commonly known as: NORVASC ?Take 1 tablet (10 mg total) by mouth Tiffany. ?  ?anastrozole 1 MG tablet ?Commonly known as: ARIMIDEX ?Take 1 mg by mouth Tiffany. ?  ?aspirin EC 81 MG tablet ?Take 1 tablet (81 mg total) by mouth Tiffany. ?  ?atorvastatin 40 MG tablet ?Commonly known as: LIPITOR ?TAKE 1 TABLET BY MOUTH  Tiffany ?  ?BD Pen Needle Nano U/F 32G X 4 MM Misc ?Generic drug: Insulin Pen Needle ?USE 1 Tiffany ?  ?blood glucose meter kit and supplies Kit ?Dispense based on patient and insurance preference. Use up to four times Tiffany as directed. (FOR ICD-9 250.00, 250.01). ?  ?cephALEXin 250 MG capsule ?Commonly known as: KEFLEX ?Take 1 capsule (250 mg total) by mouth 2 (two) times Tiffany for 2 days. ?  ?cloNIDine 0.1 MG tablet ?Commonly known as: CATAPRES ?Take 1 tablet (0.1 mg total) by mouth 2 (two) times Tiffany. ?  ?clopidogrel 75 MG tablet ?Commonly known as: PLAVIX ?TAKE 1 TABLET BY MOUTH  Tiffany ?  ?Dulcolax 5 MG EC tablet ?Generic drug: bisacodyl ?Take 1 tablet (5 mg total) by mouth Tiffany as needed for moderate constipation. ?  ?feeding supplement Liqd ?Take 237 mLs by mouth 2 (two) times Tiffany between meals. ?  ?FreeStyle [place] 2 Reader Kerrin Mo ?Use to check blood sugar continuously ?  ?FreeStyle Libre 2 Sensor Misc ?Apply 1 each topically every 14 (fourteen) days. ?  ?glucose blood test strip ?Commonly known as: ONE TOUCH TEST  STRIPS ?Use to test blood sugar twice Tiffany ?  ?hydrALAZINE 10 MG tablet ?Commonly known as: APRESOLINE ?Take 1 tablet (10 mg total) by mouth in the morning and at bedtime. ?  ?megestrol 625 MG/5ML suspension ?Commonly known as: MEGACE ES ?Take 5 mLs (625 mg total) by mouth Tiffany. ?  ?Metoprolol Tartrate 37.5 MG Tabs ?Take 37.5 mg by mouth 2 (two) times Tiffany. ?  ?nitroGLYCERIN 0.4 MG SL tablet ?Commonly known as: NITROSTAT ?Place 1 tablet (0.4 mg total) under the tongue every 5 (five) minutes as needed for chest pain. ?  ?onetouch ultrasoft lancets ?1 each by Other route 2 (two) times Tiffany. [place]  day supply for [employer].  Dx E11.9 ?  ?OneTouch Verio Reflect w/Device Kit ?1 Device by Does not apply route Tiffany. ?  ?polyethylene glycol 17 g packet ?Commonly known as: MIRALAX / GLYCOLAX ?Take 17 g by mouth Tiffany. ?  ?senna 8.6 MG Tabs tablet ?Commonly known as: SENOKOT ?Take 1 tablet (8.6 mg total) by mouth at bedtime. ?  ?valsartan 160 MG tablet ?Commonly known as: Diovan ?Take 1 tablet (160 mg total) by mouth Tiffany. ?  ?vitamin C 250 MG tablet ?Commonly known as: ASCORBIC ACID ?Take 250 mg by mouth Tiffany. ?  ? ?  ? ? ?Allergies:  ?Allergies  ?Allergen Reactions  ? Amoxicillin Hives and Nausea Only  ? Gabapentin Other (See Comments)  ?  AMS ?Confusion  ? Mirtazapine Itching  ?  Dry mouth with ODT ?Denies SOB with medication  ? Other   ?  UNKNOWN PAIN MEDICATION - unknown reaction  ? ? ?Family History: ?Family History  ?Problem Relation Age of Onset  ? Sudden death Mother   ? Arthritis Father   ? Stroke Father   ? Hypertension Sister   ? Hypertension Sister   ? Diabetes Grandchild   ? ? ?Social History:  reports that she has never smoked. She has never used smokeless tobacco. She reports that she does not drink alcohol and does not use drugs. ? ? ?Physical Exam: ?BP (!) 145/68   Pulse 64   ?Constitutional:  Alert and oriented, No acute distress.  In a wheelchair, left leg surgically absent, accompanied by  son. ?HEENT: [place] AT, moist mucus membranes.  Trachea midline, no masses. ?Cardiovascular: No clubbing, cyanosis, or edema. ?Respiratory: Normal respiratory effort, no increased work of breathing. ?Skin: No

## 2021-09-03 NOTE — Telephone Encounter (Signed)
Transition Care Management Follow-up Telephone Call ?Date of discharge and from where: 09/02/21 [facility] ?How have you been since you were released from the hospital? Information received form daughter [name], HIPAA compliant. Notes patient has little improvement with fatigue since last visit with PCP 08/31/21. Weak. Decrease in appetite but is eating/drinking. Input/output is appropriate. Reports her overall health does not seem to be improving. Blood sugar well controlled on sliding scale.   ?Any questions or concerns? No ? ?Items Reviewed: ?Did the pt receive and understand the discharge instructions provided? Yes  ?Medications obtained and verified? Yes , daughter manages. ?Any new allergies since your discharge? No  ?Dietary orders reviewed? Yes, low salt, heart healthy ?Do you have support at home? Yes , son and daughter.  ? ?Home Care and Equipment/Supplies: ?Were home health services ordered?  Yes,Home health PT OT. Palliative care to follow.  ? ?Functional Questionnaire: (I = Independent and D = Dependent) ?ADLs: Family and Aid assist ? ?Maintaining continence- I ? ?Transferring/Ambulation- wheelchair ? ?Managing Meds- son/daughter manages ? ?Follow up appointments reviewed: ? ?PCP Hospital f/u appt confirmed? Yes  Scheduled to see PCP on 09/07/21 @ 11:30. ?[facility] f/u appt confirmed? Yes  Scheduled to see Urology on 09/04/21.  ?Are transportation arrangements needed? No  ?If their condition worsens, is the pt aware to call PCP or go to the Emergency Dept.? Yes ?Was the patient provided with contact information for the PCP's office or ED? Yes ?Was to pt encouraged to call back with questions or concerns? Yes  ?

## 2021-09-04 ENCOUNTER — Other Ambulatory Visit: Payer: Self-pay

## 2021-09-04 ENCOUNTER — Ambulatory Visit (INDEPENDENT_AMBULATORY_CARE_PROVIDER_SITE_OTHER): Payer: Medicare Other | Admitting: Urology

## 2021-09-04 ENCOUNTER — Encounter: Payer: Self-pay | Admitting: Urology

## 2021-09-04 VITALS — BP 145/68 | HR 64

## 2021-09-04 DIAGNOSIS — R339 Retention of urine, unspecified: Secondary | ICD-10-CM | POA: Diagnosis not present

## 2021-09-04 LAB — BLADDER SCAN AMB NON-IMAGING: Scan Result: 385

## 2021-09-04 NOTE — Patient Instructions (Signed)
Start taking cranberry tablets, D-mannose, probiotics daily ? ?Take extra time emptying your bladder when you feel the need to urinate.  ?

## 2021-09-06 ENCOUNTER — Emergency Department: Payer: Medicare Other

## 2021-09-06 ENCOUNTER — Inpatient Hospital Stay: Payer: Medicare Other

## 2021-09-06 ENCOUNTER — Inpatient Hospital Stay
Admission: EM | Admit: 2021-09-06 | Discharge: 2021-09-15 | DRG: 871 | Disposition: E | Payer: Medicare Other | Attending: Internal Medicine | Admitting: Internal Medicine

## 2021-09-06 ENCOUNTER — Inpatient Hospital Stay: Payer: Medicare Other | Admitting: Oncology

## 2021-09-06 ENCOUNTER — Encounter: Payer: Self-pay | Admitting: Osteopathic Medicine

## 2021-09-06 ENCOUNTER — Other Ambulatory Visit: Payer: Self-pay

## 2021-09-06 DIAGNOSIS — Z8261 Family history of arthritis: Secondary | ICD-10-CM

## 2021-09-06 DIAGNOSIS — Z20822 Contact with and (suspected) exposure to covid-19: Secondary | ICD-10-CM | POA: Diagnosis present

## 2021-09-06 DIAGNOSIS — I959 Hypotension, unspecified: Secondary | ICD-10-CM | POA: Diagnosis not present

## 2021-09-06 DIAGNOSIS — D72829 Elevated white blood cell count, unspecified: Secondary | ICD-10-CM

## 2021-09-06 DIAGNOSIS — E119 Type 2 diabetes mellitus without complications: Secondary | ICD-10-CM

## 2021-09-06 DIAGNOSIS — R0603 Acute respiratory distress: Secondary | ICD-10-CM | POA: Diagnosis present

## 2021-09-06 DIAGNOSIS — Z888 Allergy status to other drugs, medicaments and biological substances status: Secondary | ICD-10-CM

## 2021-09-06 DIAGNOSIS — E860 Dehydration: Secondary | ICD-10-CM

## 2021-09-06 DIAGNOSIS — R945 Abnormal results of liver function studies: Secondary | ICD-10-CM | POA: Diagnosis not present

## 2021-09-06 DIAGNOSIS — I25118 Atherosclerotic heart disease of native coronary artery with other forms of angina pectoris: Secondary | ICD-10-CM | POA: Diagnosis not present

## 2021-09-06 DIAGNOSIS — I2510 Atherosclerotic heart disease of native coronary artery without angina pectoris: Secondary | ICD-10-CM | POA: Diagnosis present

## 2021-09-06 DIAGNOSIS — Z79810 Long term (current) use of selective estrogen receptor modulators (SERMs): Secondary | ICD-10-CM

## 2021-09-06 DIAGNOSIS — N3000 Acute cystitis without hematuria: Secondary | ICD-10-CM

## 2021-09-06 DIAGNOSIS — J90 Pleural effusion, not elsewhere classified: Secondary | ICD-10-CM | POA: Diagnosis present

## 2021-09-06 DIAGNOSIS — N1832 Chronic kidney disease, stage 3b: Secondary | ICD-10-CM | POA: Diagnosis not present

## 2021-09-06 DIAGNOSIS — Z955 Presence of coronary angioplasty implant and graft: Secondary | ICD-10-CM

## 2021-09-06 DIAGNOSIS — Z7189 Other specified counseling: Secondary | ICD-10-CM

## 2021-09-06 DIAGNOSIS — E870 Hyperosmolality and hypernatremia: Secondary | ICD-10-CM | POA: Diagnosis not present

## 2021-09-06 DIAGNOSIS — R54 Age-related physical debility: Secondary | ICD-10-CM | POA: Diagnosis present

## 2021-09-06 DIAGNOSIS — Z89612 Acquired absence of left leg above knee: Secondary | ICD-10-CM

## 2021-09-06 DIAGNOSIS — I252 Old myocardial infarction: Secondary | ICD-10-CM

## 2021-09-06 DIAGNOSIS — I469 Cardiac arrest, cause unspecified: Secondary | ICD-10-CM | POA: Diagnosis not present

## 2021-09-06 DIAGNOSIS — E875 Hyperkalemia: Secondary | ICD-10-CM | POA: Diagnosis present

## 2021-09-06 DIAGNOSIS — Z515 Encounter for palliative care: Secondary | ICD-10-CM | POA: Diagnosis not present

## 2021-09-06 DIAGNOSIS — J189 Pneumonia, unspecified organism: Secondary | ICD-10-CM | POA: Diagnosis present

## 2021-09-06 DIAGNOSIS — M6282 Rhabdomyolysis: Secondary | ICD-10-CM | POA: Diagnosis present

## 2021-09-06 DIAGNOSIS — M6284 Sarcopenia: Secondary | ICD-10-CM | POA: Diagnosis present

## 2021-09-06 DIAGNOSIS — I129 Hypertensive chronic kidney disease with stage 1 through stage 4 chronic kidney disease, or unspecified chronic kidney disease: Secondary | ICD-10-CM | POA: Diagnosis present

## 2021-09-06 DIAGNOSIS — E1159 Type 2 diabetes mellitus with other circulatory complications: Secondary | ICD-10-CM

## 2021-09-06 DIAGNOSIS — N390 Urinary tract infection, site not specified: Secondary | ICD-10-CM | POA: Diagnosis present

## 2021-09-06 DIAGNOSIS — R079 Chest pain, unspecified: Secondary | ICD-10-CM | POA: Diagnosis not present

## 2021-09-06 DIAGNOSIS — N179 Acute kidney failure, unspecified: Secondary | ICD-10-CM | POA: Diagnosis present

## 2021-09-06 DIAGNOSIS — D75839 Thrombocytosis, unspecified: Secondary | ICD-10-CM | POA: Diagnosis present

## 2021-09-06 DIAGNOSIS — Z681 Body mass index (BMI) 19 or less, adult: Secondary | ICD-10-CM

## 2021-09-06 DIAGNOSIS — R531 Weakness: Principal | ICD-10-CM

## 2021-09-06 DIAGNOSIS — E1122 Type 2 diabetes mellitus with diabetic chronic kidney disease: Secondary | ICD-10-CM | POA: Diagnosis not present

## 2021-09-06 DIAGNOSIS — Z794 Long term (current) use of insulin: Secondary | ICD-10-CM | POA: Diagnosis not present

## 2021-09-06 DIAGNOSIS — Z789 Other specified health status: Secondary | ICD-10-CM | POA: Diagnosis not present

## 2021-09-06 DIAGNOSIS — C50919 Malignant neoplasm of unspecified site of unspecified female breast: Secondary | ICD-10-CM | POA: Diagnosis present

## 2021-09-06 DIAGNOSIS — C773 Secondary and unspecified malignant neoplasm of axilla and upper limb lymph nodes: Secondary | ICD-10-CM | POA: Diagnosis not present

## 2021-09-06 DIAGNOSIS — D571 Sickle-cell disease without crisis: Secondary | ICD-10-CM | POA: Diagnosis present

## 2021-09-06 DIAGNOSIS — J449 Chronic obstructive pulmonary disease, unspecified: Secondary | ICD-10-CM | POA: Diagnosis not present

## 2021-09-06 DIAGNOSIS — N183 Chronic kidney disease, stage 3 unspecified: Secondary | ICD-10-CM | POA: Diagnosis not present

## 2021-09-06 DIAGNOSIS — R748 Abnormal levels of other serum enzymes: Secondary | ICD-10-CM

## 2021-09-06 DIAGNOSIS — R64 Cachexia: Secondary | ICD-10-CM | POA: Diagnosis not present

## 2021-09-06 DIAGNOSIS — R9431 Abnormal electrocardiogram [ECG] [EKG]: Secondary | ICD-10-CM | POA: Diagnosis not present

## 2021-09-06 DIAGNOSIS — Z951 Presence of aortocoronary bypass graft: Secondary | ICD-10-CM

## 2021-09-06 DIAGNOSIS — Z853 Personal history of malignant neoplasm of breast: Secondary | ICD-10-CM

## 2021-09-06 DIAGNOSIS — Z4682 Encounter for fitting and adjustment of non-vascular catheter: Secondary | ICD-10-CM | POA: Diagnosis not present

## 2021-09-06 DIAGNOSIS — R5381 Other malaise: Secondary | ICD-10-CM | POA: Diagnosis present

## 2021-09-06 DIAGNOSIS — E1165 Type 2 diabetes mellitus with hyperglycemia: Secondary | ICD-10-CM | POA: Diagnosis present

## 2021-09-06 DIAGNOSIS — Z8249 Family history of ischemic heart disease and other diseases of the circulatory system: Secondary | ICD-10-CM

## 2021-09-06 DIAGNOSIS — K5909 Other constipation: Secondary | ICD-10-CM | POA: Diagnosis present

## 2021-09-06 DIAGNOSIS — Z66 Do not resuscitate: Secondary | ICD-10-CM | POA: Diagnosis not present

## 2021-09-06 DIAGNOSIS — F32A Depression, unspecified: Secondary | ICD-10-CM | POA: Diagnosis not present

## 2021-09-06 DIAGNOSIS — I10 Essential (primary) hypertension: Secondary | ICD-10-CM | POA: Diagnosis not present

## 2021-09-06 DIAGNOSIS — R6520 Severe sepsis without septic shock: Secondary | ICD-10-CM | POA: Diagnosis not present

## 2021-09-06 DIAGNOSIS — R627 Adult failure to thrive: Secondary | ICD-10-CM

## 2021-09-06 DIAGNOSIS — Z170 Estrogen receptor positive status [ER+]: Secondary | ICD-10-CM

## 2021-09-06 DIAGNOSIS — E43 Unspecified severe protein-calorie malnutrition: Secondary | ICD-10-CM | POA: Diagnosis not present

## 2021-09-06 DIAGNOSIS — D638 Anemia in other chronic diseases classified elsewhere: Secondary | ICD-10-CM | POA: Diagnosis present

## 2021-09-06 DIAGNOSIS — R7401 Elevation of levels of liver transaminase levels: Secondary | ICD-10-CM

## 2021-09-06 DIAGNOSIS — R918 Other nonspecific abnormal finding of lung field: Secondary | ICD-10-CM | POA: Diagnosis present

## 2021-09-06 DIAGNOSIS — Z880 Allergy status to penicillin: Secondary | ICD-10-CM

## 2021-09-06 DIAGNOSIS — I739 Peripheral vascular disease, unspecified: Secondary | ICD-10-CM | POA: Diagnosis present

## 2021-09-06 DIAGNOSIS — I468 Cardiac arrest due to other underlying condition: Secondary | ICD-10-CM | POA: Diagnosis present

## 2021-09-06 DIAGNOSIS — R911 Solitary pulmonary nodule: Secondary | ICD-10-CM | POA: Diagnosis not present

## 2021-09-06 DIAGNOSIS — Z833 Family history of diabetes mellitus: Secondary | ICD-10-CM

## 2021-09-06 DIAGNOSIS — A419 Sepsis, unspecified organism: Principal | ICD-10-CM | POA: Diagnosis present

## 2021-09-06 DIAGNOSIS — D72828 Other elevated white blood cell count: Secondary | ICD-10-CM | POA: Diagnosis not present

## 2021-09-06 DIAGNOSIS — K219 Gastro-esophageal reflux disease without esophagitis: Secondary | ICD-10-CM | POA: Diagnosis present

## 2021-09-06 DIAGNOSIS — N189 Chronic kidney disease, unspecified: Secondary | ICD-10-CM | POA: Diagnosis not present

## 2021-09-06 DIAGNOSIS — K5900 Constipation, unspecified: Secondary | ICD-10-CM | POA: Diagnosis not present

## 2021-09-06 DIAGNOSIS — E1151 Type 2 diabetes mellitus with diabetic peripheral angiopathy without gangrene: Secondary | ICD-10-CM | POA: Diagnosis present

## 2021-09-06 DIAGNOSIS — F419 Anxiety disorder, unspecified: Secondary | ICD-10-CM | POA: Diagnosis not present

## 2021-09-06 DIAGNOSIS — K7200 Acute and subacute hepatic failure without coma: Secondary | ICD-10-CM | POA: Diagnosis not present

## 2021-09-06 DIAGNOSIS — Z79899 Other long term (current) drug therapy: Secondary | ICD-10-CM

## 2021-09-06 DIAGNOSIS — Z7982 Long term (current) use of aspirin: Secondary | ICD-10-CM

## 2021-09-06 DIAGNOSIS — J9811 Atelectasis: Secondary | ICD-10-CM | POA: Diagnosis not present

## 2021-09-06 DIAGNOSIS — Z823 Family history of stroke: Secondary | ICD-10-CM

## 2021-09-06 DIAGNOSIS — Z7902 Long term (current) use of antithrombotics/antiplatelets: Secondary | ICD-10-CM

## 2021-09-06 DIAGNOSIS — E785 Hyperlipidemia, unspecified: Secondary | ICD-10-CM | POA: Diagnosis present

## 2021-09-06 LAB — COMPREHENSIVE METABOLIC PANEL
ALT: 572 U/L — ABNORMAL HIGH (ref 0–44)
AST: 889 U/L — ABNORMAL HIGH (ref 15–41)
Albumin: 3.1 g/dL — ABNORMAL LOW (ref 3.5–5.0)
Alkaline Phosphatase: 80 U/L (ref 38–126)
Anion gap: 11 (ref 5–15)
BUN: 54 mg/dL — ABNORMAL HIGH (ref 8–23)
CO2: 15 mmol/L — ABNORMAL LOW (ref 22–32)
Calcium: 9.1 mg/dL (ref 8.9–10.3)
Chloride: 110 mmol/L (ref 98–111)
Creatinine, Ser: 1.53 mg/dL — ABNORMAL HIGH (ref 0.44–1.00)
GFR, Estimated: 32 mL/min — ABNORMAL LOW (ref >60)
Glucose, Bld: 163 mg/dL — ABNORMAL HIGH (ref 70–99)
Potassium: 5.3 mmol/L — ABNORMAL HIGH (ref 3.5–5.1)
Sodium: 136 mmol/L (ref 135–145)
Total Bilirubin: 0.8 mg/dL (ref 0.3–1.2)
Total Protein: 6.4 g/dL — ABNORMAL LOW (ref 6.5–8.1)

## 2021-09-06 LAB — CBC
HCT: 28.3 % — ABNORMAL LOW (ref 36.0–46.0)
Hemoglobin: 9 g/dL — ABNORMAL LOW (ref 12.0–15.0)
MCH: 31.6 pg (ref 26.0–34.0)
MCHC: 31.8 g/dL (ref 30.0–36.0)
MCV: 99.3 fL (ref 80.0–100.0)
Platelets: 418 10*3/uL — ABNORMAL HIGH (ref 150–400)
RBC: 2.85 MIL/uL — ABNORMAL LOW (ref 3.87–5.11)
RDW: 14.9 % (ref 11.5–15.5)
WBC: 25.4 10*3/uL — ABNORMAL HIGH (ref 4.0–10.5)
nRBC: 0 % (ref 0.0–0.2)

## 2021-09-06 LAB — HEPATIC FUNCTION PANEL
ALT: 477 U/L — ABNORMAL HIGH (ref 0–44)
AST: 759 U/L — ABNORMAL HIGH (ref 15–41)
Albumin: 2.8 g/dL — ABNORMAL LOW (ref 3.5–5.0)
Alkaline Phosphatase: 61 U/L (ref 38–126)
Bilirubin, Direct: 0.2 mg/dL (ref 0.0–0.2)
Indirect Bilirubin: 0.5 mg/dL (ref 0.3–0.9)
Total Bilirubin: 0.7 mg/dL (ref 0.3–1.2)
Total Protein: 5.8 g/dL — ABNORMAL LOW (ref 6.5–8.1)

## 2021-09-06 LAB — RETICULOCYTES
Immature Retic Fract: 25.4 % — ABNORMAL HIGH (ref 2.3–15.9)
RBC.: 2.85 MIL/uL — ABNORMAL LOW (ref 3.87–5.11)
Retic Count, Absolute: 61.8 10*3/uL (ref 19.0–186.0)
Retic Ct Pct: 2.2 % (ref 0.4–3.1)

## 2021-09-06 LAB — IRON AND TIBC
Iron: 51 ug/dL (ref 28–170)
Saturation Ratios: 25 % (ref 10.4–31.8)
TIBC: 202 ug/dL — ABNORMAL LOW (ref 250–450)
UIBC: 151 ug/dL

## 2021-09-06 LAB — RESP PANEL BY RT-PCR (FLU A&B, COVID) ARPGX2
Influenza A by PCR: NEGATIVE
Influenza B by PCR: NEGATIVE
SARS Coronavirus 2 by RT PCR: NEGATIVE

## 2021-09-06 LAB — LACTIC ACID, PLASMA
Lactic Acid, Venous: 1.6 mmol/L (ref 0.5–1.9)
Lactic Acid, Venous: 1.4 mmol/L (ref 0.5–1.9)

## 2021-09-06 LAB — BASIC METABOLIC PANEL
Anion gap: 10 (ref 5–15)
BUN: 55 mg/dL — ABNORMAL HIGH (ref 8–23)
CO2: 17 mmol/L — ABNORMAL LOW (ref 22–32)
Calcium: 9 mg/dL (ref 8.9–10.3)
Chloride: 110 mmol/L (ref 98–111)
Creatinine, Ser: 1.8 mg/dL — ABNORMAL HIGH (ref 0.44–1.00)
GFR, Estimated: 26 mL/min — ABNORMAL LOW (ref >60)
Glucose, Bld: 164 mg/dL — ABNORMAL HIGH (ref 70–99)
Potassium: 5.5 mmol/L — ABNORMAL HIGH (ref 3.5–5.1)
Sodium: 137 mmol/L (ref 135–145)

## 2021-09-06 LAB — GLUCOSE, CAPILLARY
Glucose-Capillary: 156 mg/dL — ABNORMAL HIGH (ref 70–99)
Glucose-Capillary: 161 mg/dL — ABNORMAL HIGH (ref 70–99)

## 2021-09-06 LAB — PHOSPHORUS: Phosphorus: 4.5 mg/dL (ref 2.5–4.6)

## 2021-09-06 LAB — URIC ACID: Uric Acid, Serum: 8.8 mg/dL — ABNORMAL HIGH (ref 2.5–7.1)

## 2021-09-06 LAB — CK: Total CK: 28709 U/L — ABNORMAL HIGH (ref 38–234)

## 2021-09-06 LAB — LACTATE DEHYDROGENASE: LDH: 1541 U/L — ABNORMAL HIGH (ref 98–192)

## 2021-09-06 LAB — FERRITIN: Ferritin: 396 ng/mL — ABNORMAL HIGH (ref 11–307)

## 2021-09-06 MED ORDER — ACETAMINOPHEN 325 MG RE SUPP: 650.0000 mg | Freq: Four times a day (QID) | RECTAL | Status: DC | PRN

## 2021-09-06 MED ORDER — SODIUM CHLORIDE 0.9 % IV BOLUS
500.0000 mL | Freq: Once | INTRAVENOUS | Status: AC
  Administered 2021-09-06: 500 mL via INTRAVENOUS

## 2021-09-06 MED ORDER — SODIUM CHLORIDE 0.9 % IV SOLN
2.0000 g | Freq: Once | INTRAVENOUS | Status: AC
  Administered 2021-09-06: 2 g via INTRAVENOUS
  Filled 2021-09-06: qty 2

## 2021-09-06 MED ORDER — SODIUM CHLORIDE 0.9 % IV SOLN
2.0000 g | INTRAVENOUS | Status: DC
  Administered 2021-09-07 – 2021-09-09 (×3): 2 g via INTRAVENOUS
  Filled 2021-09-06 (×4): qty 2

## 2021-09-06 MED ORDER — HEPARIN SODIUM (PORCINE) 5000 UNIT/ML IJ SOLN
5000.0000 [IU] | Freq: Three times a day (TID) | INTRAMUSCULAR | Status: DC
  Administered 2021-09-06 – 2021-09-14 (×23): 5000 [IU] via SUBCUTANEOUS
  Filled 2021-09-06 (×24): qty 1

## 2021-09-06 MED ORDER — ACETAMINOPHEN 325 MG PO TABS: 650.0000 mg | ORAL_TABLET | Freq: Four times a day (QID) | ORAL | Status: DC | PRN

## 2021-09-06 MED ORDER — METRONIDAZOLE 500 MG/100ML IV SOLN
500.0000 mg | Freq: Once | INTRAVENOUS | Status: AC
  Administered 2021-09-06: 500 mg via INTRAVENOUS
  Filled 2021-09-06: qty 100

## 2021-09-06 MED ORDER — SODIUM CHLORIDE 0.9 % IV SOLN: 2.0000 g | INTRAVENOUS | Status: DC

## 2021-09-06 MED ORDER — SODIUM CHLORIDE 0.9 % IV SOLN: 2.0000 g | Freq: Once | INTRAVENOUS | Status: DC

## 2021-09-06 MED ORDER — SODIUM CHLORIDE 0.9 % IV BOLUS: 1000.0000 mL | Freq: Once | INTRAVENOUS | Status: DC

## 2021-09-06 MED ORDER — LACTATED RINGERS IV SOLN: INTRAVENOUS | Status: DC

## 2021-09-06 MED ORDER — VANCOMYCIN HCL IN DEXTROSE 1-5 GM/200ML-% IV SOLN
1000.0000 mg | Freq: Once | INTRAVENOUS | Status: AC
  Administered 2021-09-06: 1000 mg via INTRAVENOUS
  Filled 2021-09-06: qty 200

## 2021-09-06 NOTE — ED Notes (Signed)
Pt here for increasing weakness & decreased appetite over the past several days. Pt does report nausea yesterday, denies vomiting. Pt denies pain, cough, congestion, SOB. ?

## 2021-09-06 NOTE — Assessment & Plan Note (Signed)
Anemia d/t CKD, HbSS. Concern for hemoconcentration effect d/t dehydration  ??  will check retic count and hemoccult, iron studies.  ?

## 2021-09-06 NOTE — ED Triage Notes (Signed)
Pt here with FTT and weakness. Pt states that she has not had the drive to eat lately. Pt stable in triage with family member. ?

## 2021-09-06 NOTE — Assessment & Plan Note (Signed)
?   Encourage po intake but poor prognosis if she isn't improving would consider dietary consult  ?? PT/OT ordered ?

## 2021-09-06 NOTE — Assessment & Plan Note (Signed)
Pt requesting full code - she is alert/oriented, she is changing the subject when I try to discuss likely medical futility of CPR/defibrillation and artificial ventilation.  ?? Palliative consult placed  ?

## 2021-09-06 NOTE — ED Notes (Signed)
IV team RN still at bedside for IV access. ? ?Unable to give IV meds & IVF bolus until pt has IV access. ?

## 2021-09-06 NOTE — ED Notes (Signed)
Blue top sent to lab if needed. 

## 2021-09-06 NOTE — Progress Notes (Signed)
Pt arrived to unit approx 5:30pm. Pt alert and oriented x4 breathing unlabored and shows no s/s of distress. Will continue to monitor  ?

## 2021-09-06 NOTE — Assessment & Plan Note (Signed)
?   SSI while inpatient ?

## 2021-09-06 NOTE — Progress Notes (Signed)
Pharmacy Antibiotic Note ? ?[name] is a 88 y.o. female admitted on 08/24/2021 with UTI.  Pharmacy has been consulted for cefepime dosing. ? ?Plan: ?Cefepime 2 g IV every 24 hours  ?Monitor renal function and adjust dose as clinically indicated ? ?Height: 5' (152.4 cm) ?Weight: 43 kg (94 lb 12.8 oz) ?IBW/kg (Calculated) : 45.5 ? ?Temp (24hrs), Avg:97.8 ?F (36.6 ?C), Min:97.8 ?F (36.6 ?C), Max:97.8 ?F (36.6 ?C) ? ?Recent Labs  ?Lab 08/31/21 ?1308 09/01/21 ?1884 09/02/21 ?1660 08/20/2021 ?1126 08/31/2021 ?1333  ?WBC 12.1* 11.3* 11.6* 25.4*  --   ?CREATININE 1.42* 1.37* 1.34* 1.80*  --   ?LATICACIDVEN  --   --   --   --  1.6  ?  ?Estimated Creatinine Clearance: 13.8 mL/min (A) (by C-G formula based on SCr of 1.8 mg/dL (H)).   ? ?Allergies  ?Allergen Reactions  ? Amoxicillin Hives and Nausea Only  ? Gabapentin Other (See Comments)  ?  AMS ?Confusion  ? Mirtazapine Itching  ?  Dry mouth with ODT ?Denies SOB with medication  ? Other   ?  UNKNOWN PAIN MEDICATION - unknown reaction  ? ? ?Antimicrobials this admission: ?3/23 vanc/metronidazole x 1 ?3/23 cefepime >>  ? ? ?Microbiology results: ?3/23 BCx: sent ? ?Thank you for allowing pharmacy to be a part of this patient?s care. ? ?Tiffany Bailey ?08/26/2021 4:58 PM ? ?

## 2021-09-06 NOTE — ED Notes (Signed)
Pt placed on cardiac, BP, pulse ox monitors.  ? ?Sacral heart placed on pts sacral area for 2 small pressure ulcers (present on arrival). Pt repositioned on R side for comfort & pillows placed on either side of pt. ?

## 2021-09-06 NOTE — H&P (Signed)
? ? ?HISTORY AND PHYSICAL ? ?Patient: Tiffany Bailey 88 y.o. female ?MRN: [id number] ? ?Today is hospital day 0 after admission on 08/30/2021 11:43 AM ? ?RECORD REVIEW AND HOSPITAL COURSE: ?Tiffany Bailey is a 88 y.o. female with history of diabetes, CAD, hypertension, recent hospitalization for UTI, CHF, along other medical problems, see past medical history, presents emergency department with weakness.  Patient was recently admitted to the hospital for UTI and discharged 4 days ago.  Patient had an acute UTI and Foley have been placed.  Daughter states that she has been getting her up to go the bathroom frequently.  Has not noticed any smell to the urine.  Has had no fever that she knows of.  No chest pain or shortness of breath.  No cough or congestion.  No vomiting or diarrhea.  States that she has not been wanting to eat or drink for the last several days. ?In ED: ?Labs: ?Urinalysis/urine culture pending collection as of time of admission ?Creatinine elevated to 1.8 above baseline 1.3-1.4, GFR 26 decreased from baseline in 30s ?Anemia relatively stable, hemoglobin 9.0, was 7.8 on discharge 4 days ago ?White blood cells elevated to 25.4, was 11.6 on discharge from hospital 4 days ago.   ?Platelets 418 ?Hepatic function panel demonstrated sharp increase in AST/ALT, which were 150/70 respectively 9 days ago, are 759/477 respectively now ?Reduced total protein/albumin ?Lactic acid 1.6 ?Respiratory panel negative for COVID-19, influenza A/B ?Imaging:  ?Chest x-ray showed interval decrease in infiltrate in right perihilar region resolving atelectasis/pneumonia.  Increased haziness in right lower lung fields may be due to layering of small right pleural effusion ?RUQ ultrasound to work-up elevated AST/ALT was normal ?EKG no ST/T changes concerning for ischemia  ?Treatment: ?Antibiotics: Aztreonam, Vancomycin, metronidazole, cefepime ?IV fluids: Sodium chloride 0.9% bolus total 1500 mL, currently on lactated Ringer's  infusion ? ?Consultants:  ?none ? ? ? ?SUBJECTIVE:  ?Patient seen and examined at bedside in ED, resting comfortably.  No family present at bedside at this time.  Patient is alert and oriented to person, place, time, situation.  She states that she "just has not been feeling too good, I think I still have that UTI" and denies pain, including chest pain, denies shortness of breath. ? ? ? ? ?ASSESSMENT & PLAN ? ?Sepsis ([place]) ?Sepsis criteria w/ elevation in WBC, elevation in respiratory rate, concern for UTI though UA currently pending unable to collect specimen. Can also be SIRS due to failure to thrive, recent UTI either or both possibly precipitating AKI on CKD.  ?Continue fluids, antibiotics ?Trending lactate, procalcitonin  ? ?Transaminitis ?Elevated liver enzymes w/ normal RUQ [place], possible shock liver vs hepatotoxic effect of medications vs malignant infiltration of liver -->  ?check LDH if high would suspect ischemic liver injury though AST/ALT are not so high that there is immediate suspicion for ischemic/infarct injury ?if renal function improves will get CT abdomen to evaluate for metastatic disease but will hold off for now given potential for nephrotoxic effects of contrast, reassuring nothing obvious on ultrasound in terms of liver mass  ?Checking ferritin for complete anemia w/u though iron storage issue seems unlikely and may expect high ferritin in inflammatory state ? ?Hyperkalemia ?Hyperkalemia likely d/t dehydration and AKI  ?but given breast CA will also eval for tumor lysis syndrome by adding uric acid and phosphorous to labs thankfully calcium is WNL   ?Continue fluid resuscitation and recheck labs this afternoon  ? ?Acute kidney injury superimposed on chronic kidney disease (  [place]) ?AKI precipitated by recent UTI and/or dehydration -->  ?fluid resuscitation ?Antibiotics for UTI ?will check CK to evaluate for rhabdomyolysis ? ?UTI (urinary tract infection) ?Continue antibiotics  ?Await UA ? ?DM  (diabetes mellitus) ([place]) ?SSI while inpatient ? ?Metastatic breast cancer ([place]) ?Pt requesting full code - she is alert/oriented, she is changing the subject when I try to discuss likely medical futility of CPR/defibrillation and artificial ventilation.  ?Palliative consult placed  ? ?Pleural effusion ?Less likely pneumonia ?Fluid resuscitating for sepsis and AKI so will check CXR in AM ? ?Failure to thrive in adult ?Encourage po intake but poor prognosis if she isn't improving would consider dietary consult  ?PT/OT ordered ? ?Hb-SS disease without crisis ([place]) ?Anemia d/t CKD, HbSS. Concern for hemoconcentration effect d/t dehydration  ? will check retic count and hemoccult, iron studies.  ? ? ? ?VTE Ppx: heparin given AKI ?CODE STATUS   Code Status: Full Code ?Admitted from: home ?Expected Dispo: SNF was previously recommended and family declined this, TOC consult placed ?Barriers to discharge: continued medical workup and treatment  ?Family communication: pt asks I speak w/ daughter [name] - multiple children listed on contacts, no documented HCPOA or advanced directive other than MOST form dated a year ago 09/12/2020 ? ? ? ? ? ? ? ? ? ? ? ? ? ?Past Medical History:  ?Diagnosis Date  ? Anemia   ? Anginal pain ([place])   ? Arthritis   ? Breast cancer ([place]) 07/19/2019  ? CKD (chronic kidney disease), stage III ([place])   ? Coronary artery disease   ? a. 2002 CABG x 3 (LIMA-LAD, SVG-D1, SVG-RCA); b. s/p multiple PCI's.  ? Diabetes mellitus without complication ([place])   ? Diastolic dysfunction   ? a. 12/2017 Echo: EF 55-60%, no rwma, Gr1 DD. Triv AI. Mild-mod MR/TR. PASP 29mHg.  ? Edema   ? Failure to thrive in adult   ? Fecal impaction ([place] 08/23/2021  ? GERD (gastroesophageal reflux disease)   ? 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 History of breast cancer   ? HOH (hard of hearing)   ? Hyperlipidemia   ? Hypertension   ? Myocardial infarction ([facility]   ? PAD (peripheral artery disease) ([place]   ? a. s/p L AKA; b.  12/2018 PTA R peroneal & PTA/Stenting R SFA.  ? Palpitations   ? ? ?Past Surgical History:  ?Procedure Laterality Date  ? ABDOMINAL HYSTERECTOMY    ? ABOVE KNEE LEG AMPUTATION Left 2013  ? ANTERIOR VITRECTOMY Left 11/16/2015  ? Procedure: ANTERIOR VITRECTOMY;  Surgeon: BEulogio Bear MD;  Location: [facility] ORS;  Service: Ophthalmology;  Laterality: Left;  ? BLADDER SURGERY    ? CATARACT EXTRACTION W/PHACO Left 11/16/2015  ? Procedure: CATARACT EXTRACTION PHACO AND INTRAOCULAR LENS PLACEMENT (IOC);  Surgeon: BEulogio Bear MD;  Location: [facility] ORS;  Service: Ophthalmology;  Laterality: Left;  Lot # [id number] ?[place] 01:22.8 ?AP%:12.6 ?CDE: 10.46  ? CATARACT EXTRACTION W/PHACO Right 08/22/2016  ? Procedure: CATARACT EXTRACTION PHACO AND INTRAOCULAR LENS PLACEMENT (IOC);  Surgeon: BEulogio Bear MD;  Location: [facility] ORS;  Service: Ophthalmology;  Laterality: Right;  Lot # [id number] ?[place] 00:52.1 ?AP%:8.7 ?CDE: 4.99 ?  ? CORONARY ANGIOPLASTY    ? STENT  ? CORONARY ARTERY BYPASS GRAFT    ? LOWER EXTREMITY ANGIOGRAPHY Right 01/14/2019  ? Procedure: LOWER EXTREMITY ANGIOGRAPHY;  Surgeon: DAlgernon Huxley MD;  [facility];  Service: Cardiovascular;  Laterality: Right;  ? MASTECTOMY    ?  TUMOR REMOVAL    ? ABDOMINAL  ? ? ?Family History  ?Problem Relation Age of Onset  ? Sudden death Mother   ? Arthritis Father   ? Stroke Father   ? Hypertension Sister   ? Hypertension Sister   ? Diabetes Grandchild   ? ?Social History:  reports that she has never smoked. She has never used smokeless tobacco. She reports that she does not drink alcohol and does not use drugs. ? ?Allergies:  ?Allergies  ?Allergen Reactions  ? Amoxicillin Hives and Nausea Only  ? Gabapentin Other (See Comments)  ?  AMS ?Confusion  ? Mirtazapine Itching  ?  Dry mouth with ODT ?Denies SOB with medication  ? Other   ?  UNKNOWN PAIN MEDICATION - unknown reaction  ? ? ?No current facility-administered medications on file prior to encounter.  ? ?Current  Outpatient Medications on File Prior to Encounter  ?Medication Sig Dispense Refill  ? amLODipine (NORVASC) 10 MG tablet Take 1 tablet (10 mg total) by mouth daily. 90 tablet 1  ? anastrozole (ARIMIDEX) 1 MG t

## 2021-09-06 NOTE — Assessment & Plan Note (Signed)
?   Continue antibiotics  ?? Await UA ?

## 2021-09-06 NOTE — ED Notes (Signed)
Secure msg sent to Philis Kendall, RN for ED to IP SBAR. ?

## 2021-09-06 NOTE — Assessment & Plan Note (Signed)
Hyperkalemia likely d/t dehydration and AKI  ?? but given breast CA will also eval for tumor lysis syndrome by adding uric acid and phosphorous to labs thankfully calcium is WNL   ?? Continue fluid resuscitation and recheck labs this afternoon  ?

## 2021-09-06 NOTE — ED Provider Notes (Signed)
? ?[facility] ?Provider Note ? ? ? Event Date/Time  ? First MD Initiated Contact with Patient 09/07/2021 1150   ?  (approximate) ? ? ?History  ? ?Weakness ? ? ?HPI ? ?Tiffany Bailey is a 88 y.o. female with history of diabetes, CAD, hypertension, recent UTI, CHF, along other medical problems, see past medical history, presents emergency department with weakness.  Patient was recently admitted to the hospital and discharged 4 days ago.  Patient had an acute UTI and Foley have been placed.  Daughter states that she has been getting her up to go the bathroom frequently.  Has not noticed any smell to the urine.  Has had no fever that she knows of.  No chest pain or shortness of breath.  No cough or congestion.  No vomiting or diarrhea.  States that she has not been wanting to eat or drink for the last several days. ? ?  ? ? ?Physical Exam  ? ?Triage Vital Signs: ?ED Triage Vitals [09/02/2021 1115]  ?Enc Vitals Group  ?   BP (!) 117/41  ?   Pulse Rate (!) 52  ?   Resp 16  ?   Temp 97.8 ?F (36.6 ?C)  ?   Temp Source Oral  ?   SpO2 99 %  ?   Weight 94 lb 12.8 oz (43 kg)  ?   Height 5' (1.524 m)  ?   Head Circumference   ?   Peak Flow   ?   Pain Score 6  ?   Pain Loc   ?   Pain Edu?   ?   Excl. in [place]?   ? ? ?Most recent vital signs: ?Vitals:  ? 09/03/2021 1500 08/23/2021 1530  ?BP: (!) 125/43 (!) 128/46  ?Pulse: 67 68  ?Resp: (!) 21 16  ?Temp:    ?SpO2: 99% 99%  ? ? ? ?General: Awake, no distress.   ?CV:  Good peripheral perfusion.  Bradycardic and  rhythm ?Resp:  Normal effort. Lungs seen today ?Abd:  No distention.  Nontender ?Other:  Patient has a few blisters on the sacrum area, sacral pad was placed ? ? ?ED Results / Procedures / Treatments  ? ?Labs ?(all labs ordered are listed, but only abnormal results are displayed) ?Labs Reviewed  ?BASIC METABOLIC PANEL - Abnormal; Notable for the following components:  ?    Result Value  ? Potassium 5.5 (*)   ? CO2 17 (*)   ? Glucose, Bld 164 (*)   ? BUN 55 (*)   ?  Creatinine, Ser 1.80 (*)   ? GFR, Estimated 26 (*)   ? All other components within normal limits  ?CBC - Abnormal; Notable for the following components:  ? WBC 25.4 (*)   ? RBC 2.85 (*)   ? Hemoglobin 9.0 (*)   ? HCT 28.3 (*)   ? Platelets 418 (*)   ? All other components within normal limits  ?HEPATIC FUNCTION PANEL - Abnormal; Notable for the following components:  ? Total Protein 5.8 (*)   ? Albumin 2.8 (*)   ? AST 759 (*)   ? ALT 477 (*)   ? All other components within normal limits  ?CULTURE, BLOOD (ROUTINE X 2)  ?CULTURE, BLOOD (ROUTINE X 2)  ?RESP PANEL BY RT-PCR (FLU A&B, COVID) [id number]  ?LACTIC ACID, PLASMA  ?URINALYSIS, ROUTINE W REFLEX MICROSCOPIC  ?LACTIC ACID, PLASMA  ?CBG MONITORING, ED  ? ? ? ?EKG ? ?EKG shows bradycardia,  see physician read ? ? ?RADIOLOGY ?Chest x-ray ? ? ? ?PROCEDURES: ? ? ?Procedures ? ? ?MEDICATIONS ORDERED IN ED: ?Medications  ?vancomycin (VANCOCIN) IVPB 1000 mg/200 mL premix (has no administration in time range)  ?sodium chloride 0.9 % bolus 500 mL (0 mLs Intravenous Stopped 08/30/2021 1447)  ?metroNIDAZOLE (FLAGYL) IVPB 500 mg (500 mg Intravenous New Bag/Given 09/09/2021 1438)  ?ceFEPIme (MAXIPIME) 2 g in sodium chloride 0.9 % 100 mL IVPB (0 g Intravenous Stopped 08/17/2021 1417)  ? ? ? ?IMPRESSION / MDM / ASSESSMENT AND PLAN / ED COURSE  ?I reviewed the triage vital signs and the nursing notes. ?             ?               ? ?Differential diagnosis includes, but is not limited to, sepsis, dehydration, failure to thrive, UTI ? ?Patient's labs are concerning, WBC is elevated at 25.4, 4 days ago her WBC was 11.6, H&H is close to her normal values from 4 days ago.  However her hepatic function is very concerning levels of AST and ALT of 759 and 287, basic metabolic panel does show a decrease in her kidney function with BUN of 55 and creatinine of 1.8, glucose also elevated at 1.6, potassium increased to 5.5.  Hopefully with fluids the potassium will come back into a normal range. ? ?Chest  x-ray independently reviewed by me, shows pleural effusion on the right side, confirmed by radiology ? ?Ultrasound right upper quadrant due to elevated liver enzymes was independently reviewed by me, read as negative by radiology ? ?Due to the patient's weakness, recent UTI, elevated liver enzymes, elevated WBC of 25.4 will admit the patient for weakness, dehydration, and possible sepsis. ? ?Sepsis protocols have already been initiated.  Patient was given fluids and antibiotics. ? ?Consult hospitalist, hospitalist admitting, patient is stable ? ? ? ? ?  ? ? ?FINAL CLINICAL IMPRESSION(S) / ED DIAGNOSES  ? ?Final diagnoses:  ?Weakness  ?Elevated liver enzymes  ?Dehydration  ? ? ? ?Rx / DC Orders  ? ?ED Discharge Orders   ? ? None  ? ?  ? ? ? ?Note:  This document was prepared using Dragon voice recognition software and may include unintentional dictation errors. ? ?  ?Versie Starks, PA-C ?09/07/2021 1557 ? ?  ?Harvest Dark, MD ?09/07/21 1104 ? ?

## 2021-09-06 NOTE — Assessment & Plan Note (Addendum)
Sepsis criteria w/ elevation in WBC, elevation in respiratory rate, concern for UTI though UA currently pending unable to collect specimen. Can also be SIRS due to failure to thrive, recent UTI either or both possibly precipitating AKI on CKD.  ?? Continue fluids, antibiotics ?? Trending lactate, procalcitonin  ?

## 2021-09-06 NOTE — Assessment & Plan Note (Signed)
AKI precipitated by recent UTI and/or dehydration -->  ?? fluid resuscitation ?? Antibiotics for UTI ?? will check CK to evaluate for rhabdomyolysis ?

## 2021-09-06 NOTE — Assessment & Plan Note (Signed)
Elevated liver enzymes w/ normal RUQ [place], possible shock liver vs hepatotoxic effect of medications vs malignant infiltration of liver -->  ?? check LDH if high would suspect ischemic liver injury though AST/ALT are not so high that there is immediate suspicion for ischemic/infarct injury ?? if renal function improves will get CT abdomen to evaluate for metastatic disease but will hold off for now given potential for nephrotoxic effects of contrast, reassuring nothing obvious on ultrasound in terms of liver mass  ?? Checking ferritin for complete anemia w/u though iron storage issue seems unlikely and may expect high ferritin in inflammatory state ?

## 2021-09-06 NOTE — Progress Notes (Signed)
PHARMACY -  BRIEF ANTIBIOTIC NOTE  ? ?Pharmacy has received consult(s) for vancomycin and aztreonam from an ED provider.  The patient's profile has been reviewed for ht/wt/allergies/indication/available labs. Pt has noted allergy to amoxicillin (hives, nausea), however has tolerated cephalosporins in the past. Will change aztreonam to cefepime.  ? ?One time order(s) placed for: ?Cefepime 2 g IV ?Vancomycin 1g IV ? ?Further antibiotics/pharmacy consults should be ordered by admitting physician if indicated.       ?                ?Thank you, ?Marciana Uplinger O Lindley Stachnik ?09/12/2021  1:18 PM ? ?

## 2021-09-06 NOTE — Assessment & Plan Note (Signed)
?   Less likely pneumonia ?? Fluid resuscitating for sepsis and AKI so will check CXR in AM ?

## 2021-09-06 NOTE — ED Notes (Signed)
This RN attempted to obtain PIV access unsuccessfully x1; another RN attempted PIV access unsuccessfully x2. Order placed for IV team consult. ?

## 2021-09-06 NOTE — Plan of Care (Signed)

## 2021-09-07 ENCOUNTER — Inpatient Hospital Stay: Payer: Medicare Other | Admitting: Family

## 2021-09-07 ENCOUNTER — Inpatient Hospital Stay: Payer: Medicare Other

## 2021-09-07 DIAGNOSIS — A419 Sepsis, unspecified organism: Secondary | ICD-10-CM | POA: Diagnosis not present

## 2021-09-07 DIAGNOSIS — R7401 Elevation of levels of liver transaminase levels: Secondary | ICD-10-CM | POA: Diagnosis not present

## 2021-09-07 DIAGNOSIS — E43 Unspecified severe protein-calorie malnutrition: Secondary | ICD-10-CM | POA: Insufficient documentation

## 2021-09-07 DIAGNOSIS — N3000 Acute cystitis without hematuria: Secondary | ICD-10-CM | POA: Diagnosis not present

## 2021-09-07 DIAGNOSIS — R6520 Severe sepsis without septic shock: Secondary | ICD-10-CM | POA: Diagnosis not present

## 2021-09-07 LAB — URINALYSIS, ROUTINE W REFLEX MICROSCOPIC
Bilirubin Urine: NEGATIVE
Glucose, UA: NEGATIVE mg/dL
Ketones, ur: NEGATIVE mg/dL
Leukocytes,Ua: NEGATIVE
Nitrite: NEGATIVE
Protein, ur: 100 mg/dL — AB
Specific Gravity, Urine: 1.014 (ref 1.005–1.030)
Squamous Epithelial / HPF: NONE SEEN (ref 0–5)
pH: 5 (ref 5.0–8.0)

## 2021-09-07 LAB — CORTISOL-AM, BLOOD: Cortisol - AM: 13.6 ug/dL (ref 6.7–22.6)

## 2021-09-07 LAB — CBC
HCT: 29.6 % — ABNORMAL LOW (ref 36.0–46.0)
Hemoglobin: 9.6 g/dL — ABNORMAL LOW (ref 12.0–15.0)
MCH: 31 pg (ref 26.0–34.0)
MCHC: 32.4 g/dL (ref 30.0–36.0)
MCV: 95.5 fL (ref 80.0–100.0)
Platelets: 501 10*3/uL — ABNORMAL HIGH (ref 150–400)
RBC: 3.1 MIL/uL — ABNORMAL LOW (ref 3.87–5.11)
RDW: 14.9 % (ref 11.5–15.5)
WBC: 30.5 10*3/uL — ABNORMAL HIGH (ref 4.0–10.5)
nRBC: 0 % (ref 0.0–0.2)

## 2021-09-07 LAB — COMPREHENSIVE METABOLIC PANEL
ALT: 589 U/L — ABNORMAL HIGH (ref 0–44)
AST: 902 U/L — ABNORMAL HIGH (ref 15–41)
Albumin: 3.1 g/dL — ABNORMAL LOW (ref 3.5–5.0)
Alkaline Phosphatase: 76 U/L (ref 38–126)
Anion gap: 12 (ref 5–15)
BUN: 54 mg/dL — ABNORMAL HIGH (ref 8–23)
CO2: 17 mmol/L — ABNORMAL LOW (ref 22–32)
Calcium: 9.2 mg/dL (ref 8.9–10.3)
Chloride: 109 mmol/L (ref 98–111)
Creatinine, Ser: 1.58 mg/dL — ABNORMAL HIGH (ref 0.44–1.00)
GFR, Estimated: 31 mL/min — ABNORMAL LOW (ref >60)
Glucose, Bld: 145 mg/dL — ABNORMAL HIGH (ref 70–99)
Potassium: 5.3 mmol/L — ABNORMAL HIGH (ref 3.5–5.1)
Sodium: 138 mmol/L (ref 135–145)
Total Bilirubin: 0.7 mg/dL (ref 0.3–1.2)
Total Protein: 6.1 g/dL — ABNORMAL LOW (ref 6.5–8.1)

## 2021-09-07 LAB — PROCALCITONIN: Procalcitonin: 0.36 ng/mL

## 2021-09-07 LAB — PROTIME-INR
INR: 1.1 (ref 0.8–1.2)
Prothrombin Time: 13.8 seconds (ref 11.4–15.2)

## 2021-09-07 LAB — STREP PNEUMONIAE URINARY ANTIGEN: Strep Pneumo Urinary Antigen: NEGATIVE

## 2021-09-07 MED ORDER — OXYCODONE-ACETAMINOPHEN 5-325 MG PO TABS
1.0000 | ORAL_TABLET | Freq: Four times a day (QID) | ORAL | Status: DC | PRN
  Administered 2021-09-07 – 2021-09-12 (×3): 1 via ORAL
  Filled 2021-09-07 (×3): qty 1

## 2021-09-07 MED ORDER — ACETAMINOPHEN 325 MG PO TABS: 650.0000 mg | ORAL_TABLET | Freq: Four times a day (QID) | ORAL | Status: DC | PRN

## 2021-09-07 MED ORDER — SODIUM CHLORIDE 0.9 % IV SOLN
500.0000 mg | INTRAVENOUS | Status: DC
  Administered 2021-09-07 – 2021-09-10 (×4): 500 mg via INTRAVENOUS
  Filled 2021-09-07: qty 5
  Filled 2021-09-07 (×2): qty 500
  Filled 2021-09-07: qty 5

## 2021-09-07 MED ORDER — LABETALOL HCL 5 MG/ML IV SOLN
10.0000 mg | INTRAVENOUS | Status: DC | PRN
  Administered 2021-09-07 – 2021-09-11 (×3): 10 mg via INTRAVENOUS
  Filled 2021-09-07 (×3): qty 4

## 2021-09-07 MED ORDER — MORPHINE SULFATE (PF) 2 MG/ML IV SOLN
1.0000 mg | INTRAVENOUS | Status: DC | PRN
  Administered 2021-09-12 – 2021-09-13 (×4): 1 mg via INTRAVENOUS
  Filled 2021-09-07 (×5): qty 1

## 2021-09-07 MED ORDER — IPRATROPIUM-ALBUTEROL 0.5-2.5 (3) MG/3ML IN SOLN
3.0000 mL | Freq: Four times a day (QID) | RESPIRATORY_TRACT | Status: DC | PRN
Start: 2021-09-07 — End: 2021-09-15

## 2021-09-07 MED ORDER — ENSURE ENLIVE PO LIQD
237.0000 mL | Freq: Two times a day (BID) | ORAL | Status: DC
  Administered 2021-09-07 – 2021-09-14 (×5): 237 mL via ORAL

## 2021-09-07 MED ORDER — SODIUM ZIRCONIUM CYCLOSILICATE 10 G PO PACK
10.0000 g | PACK | Freq: Once | ORAL | Status: AC
  Administered 2021-09-07: 10 g via ORAL
  Filled 2021-09-07: qty 1

## 2021-09-07 NOTE — Progress Notes (Signed)
Pt just got up in the chair by PT. RN aware an will re consult when pt is back in the bed. ?

## 2021-09-07 NOTE — Progress Notes (Signed)
Mobility Specialist - Progress Note ? ? 09/07/21 1051  ?Mobility  ?Activity Turned to right side  ?Level of Assistance Moderate assist, patient does 50-74%  ?Assistive Device None  ?$Mobility charge 1 Mobility  ? ? ? ?Mobility responded to bed alarm. Pt voiced pain in RLE and abdomen requesting readjustment in bed. Pt slouched down upon arrival, repositioned to [facility] with +2 assist from daughter. Rolled R for pillow wedge support. Pt left supine with alarm set, needs in reach.  ? ? ?Kathee Delton ?Mobility Specialist ?09/07/21, 10:54 AM ? ? ? ? ?

## 2021-09-07 NOTE — Progress Notes (Addendum)
?PROGRESS NOTE ? ? ? ?Tiffany Bailey  UUE:[id number] DOB: 03-04-1928 DOA: 08/19/2021 ?PCP: [name], FNP  ? ? ? ?Assessment & Plan: ?  ?Principal Problem: ?  Sepsis ([place]) ?Active Problems: ?  DM (diabetes mellitus) ([place]) ?  Essential hypertension ?  CAD (coronary artery disease), native coronary artery ?  GERD (gastroesophageal reflux disease) ?  PAD (peripheral artery disease) ([place]) ?  Hx of AKA (above knee amputation), left ([place]) ?  Anxiety and depression ?  Acute kidney injury superimposed on chronic kidney disease ([place]) ?  Metastatic breast cancer ([place]) ?  Hb-SS disease without crisis ([place]) ?  Hyperkalemia ?  Goals of care, counseling/discussion ?  Pleural effusion ?  UTI (urinary tract infection) ?  Physical deconditioning ?  Transaminitis ?  Failure to thrive in adult ?  Dehydration ?  Elevated liver enzymes ? ? ?Sepsis: met criteria w/ leukocytosis, tachypnea & likely UTI. Continue on IV cefepime. Urine & blood cxs are pending. Continue on IVFs. Procal 0.36 ? ?Transaminitis: etiology unclear, possibly shock liver. Will check hepatitis panel. [place] of RUQ was normal   ? ?Hyperkalemia: lokelma x1. Will continue to monitor  ?  ?AKI on CKD: baseline Cr/GFR is unknown, currently stage IIIb. Cr is trending up slightly from day prior. Continue on IVFs. Likely secondary to rhabdomyolysis ? ?Possible pneumonia: repeat CXR shows worse patchy opacities on the mid right perihilar area down. Started on azithromycin, bronchodilators & encourage incentive spirometry. Will check strep, legionella   ? ?Rhabdomyolysis: significant. Etiology unclear. Continue on IVFs. Will continue to monitor CK ?  ?UTI: urine cx is pending. Continue on IV cefepime  ? ?DM2: likely poorly controlled. Continue on SSI w/ accuchecks. Hx of L BKA  ?  ?Metastatic breast cancer: management per onco outpatient  ?  ?Pleural effusion: small as per XR. Not requiring supplemental oxygen currently  ? ?Failure to thrive in adult: poor po intake as well.  Palliative care consulted  ? ?Unlikely Hb-SS disease: no hx of sickle cell anemia as per pt's son, [name] ? ?Severe protein calorie malnutrition: will start nutritional supplements ? ? ?DVT prophylaxis: heparin ?Code Status: DNR ?Family Communication: discussed pt's care w/ pt's daughter at bedside and answered her questions  ?Disposition Plan:  unclear ? ?Level of care: Progressive ? ?Status is: Inpatient ?Remains inpatient appropriate because: severity of illness ? ? ? ?Consultants:  ?Palliative care ? ?Procedures:  ? ?Antimicrobials: cefepime, azithromycin  ? ? ?Subjective: ?Pt c/o malaise  ? ?Objective: ?Vitals:  ? 09/05/2021 2001 09/07/21 0023 09/07/21 0128 09/07/21 0404  ?BP: (!) 165/54 (!) 172/54 (!) 154/56 (!) 160/54  ?Pulse: 82 86 88 95  ?Resp: 18 20  18   ?Temp: 98.5 ?F (36.9 ?C) 98.3 ?F (36.8 ?C)  98.6 ?F (37 ?C)  ?TempSrc:    Oral  ?SpO2: 97% 99%  100%  ?Weight:      ?Height:      ? ? ?Intake/Output Summary (Last 24 hours) at 09/07/2021 0654 ?Last data filed at 09/07/2021 0308 ?Gross per 24 hour  ?Intake 1277.3 ml  ?Output --  ?Net 1277.3 ml  ? ?Filed Weights  ? 08/15/2021 1115  ?Weight: 43 kg  ? ? ?Examination: ? ?General exam: Appears calm and comfortable. Frail appearing  ?Respiratory system: Clear to auscultation. Respiratory effort normal. ?Cardiovascular system: S1 & S2 +. No  rubs, gallops or clicks. No pedal edema. ?Gastrointestinal system: Abdomen is nondistended, soft and nontender. Normal bowel sounds heard. ?Central nervous system: Alert and oriented.  Moves all extremities  ?Psychiatry: Judgement and insight appear normal. Flat mood and affect   ? ? ? ?Data Reviewed: I have personally reviewed following labs and imaging studies ? ?CBC: ?Recent Labs  ?Lab 08/31/21 ?1308 09/01/21 ?2979 09/02/21 ?8921 09/13/2021 ?1126 09/07/21 ?0441  ?WBC 12.1* 11.3* 11.6* 25.4* 30.5*  ?HGB 8.6* 7.6* 7.8* 9.0* 9.6*  ?HCT 27.5* 23.0* 23.6* 28.3* 29.6*  ?MCV 100.0 94.7 94.4 99.3 95.5  ?PLT 252 267 302 418* 501*  ? ?Basic  Metabolic Panel: ?Recent Labs  ?Lab 09/01/21 ?1941 09/02/21 ?7408 08/20/2021 ?1126 08/24/2021 ?1955 09/07/21 ?0441  ?NA 133* 134* 137 136 138  ?K 4.9 5.2* 5.5* 5.3* 5.3*  ?CL 107 110 110 110 109  ?CO2 17* 19* 17* 15* 17*  ?GLUCOSE 79 186* 164* 163* 145*  ?BUN 34* 32* 55* 54* 54*  ?CREATININE 1.37* 1.34* 1.80* 1.53* 1.58*  ?CALCIUM 8.4* 8.6* 9.0 9.1 9.2  ?PHOS  --   --   --  4.5  --   ? ?GFR: ?Estimated Creatinine Clearance: 15.7 mL/min (A) (by C-G formula based on SCr of 1.58 mg/dL (H)). ?Liver Function Tests: ?Recent Labs  ?Lab 09/07/2021 ?1126 09/13/2021 ?1955 09/07/21 ?0441  ?AST 759* 889* 902*  ?ALT 477* 572* 589*  ?ALKPHOS 61 80 76  ?BILITOT 0.7 0.8 0.7  ?PROT 5.8* 6.4* 6.1*  ?ALBUMIN 2.8* 3.1* 3.1*  ? ?No results for input(s): LIPASE, AMYLASE in the last 168 hours. ?No results for input(s): AMMONIA in the last 168 hours. ?Coagulation Profile: ?Recent Labs  ?Lab 09/07/21 ?0441  ?INR 1.1  ? ?Cardiac Enzymes: ?Recent Labs  ?Lab 08/18/2021 ?1953  ?CKTOTAL 28,709*  ? ?BNP (last 3 results) ?No results for input(s): PROBNP in the last 8760 hours. ?HbA1C: ?No results for input(s): HGBA1C in the last 72 hours. ?CBG: ?Recent Labs  ?Lab 09/01/21 ?1727 09/01/21 ?2116 09/02/21 ?0825 09/07/2021 ?1857 08/23/2021 ?2042  ?GLUCAP 231* 232* 153* 156* 161*  ? ?Lipid Profile: ?No results for input(s): CHOL, HDL, LDLCALC, TRIG, CHOLHDL, LDLDIRECT in the last 72 hours. ?Thyroid Function Tests: ?No results for input(s): TSH, T4TOTAL, FREET4, T3FREE, THYROIDAB in the last 72 hours. ?Anemia Panel: ?Recent Labs  ?  09/10/2021 ?1126 09/05/2021 ?1955  ?FERRITIN  --  396*  ?TIBC  --  202*  ?IRON  --  51  ?RETICCTPCT 2.2  --   ? ?Sepsis Labs: ?Recent Labs  ?Lab 09/05/2021 ?1333 08/21/2021 ?1955 09/07/21 ?0441  ?PROCALCITON  --   --  0.36  ?LATICACIDVEN 1.6 1.4  --   ? ? ?Recent Results (from the past 240 hour(s))  ?Culture, blood (routine x 2)     Status: None (Preliminary result)  ? Collection Time: 09/02/2021  1:33 PM  ? Specimen: BLOOD  ?Result Value Ref Range  Status  ? Specimen Description BLOOD RIGHT FATTY CASTS  Final  ? Special Requests   Final  ?  BOTTLES DRAWN AEROBIC AND ANAEROBIC Blood Culture adequate volume  ? Culture   Final  ?  NO GROWTH < 24 HOURS ?Performed at [facility], [address] [place], [place] [address] ?  ? Report Status PENDING  Incomplete  ?Resp Panel by RT-PCR (Flu A&B, Covid) Nasopharyngeal Swab     Status: None  ? Collection Time: 08/17/2021  3:17 PM  ? Specimen: Nasopharyngeal Swab; Nasopharyngeal(NP) swabs in vial transport medium  ?Result Value Ref Range Status  ? SARS Coronavirus 2 by RT PCR NEGATIVE NEGATIVE Final  ?  Comment: (NOTE) ?SARS-CoV-2 target nucleic acids are NOT  DETECTED. ? ?The SARS-CoV-2 RNA is generally detectable in upper respiratory ?specimens during the acute phase of infection. The lowest ?concentration of SARS-CoV-2 viral copies this assay can detect is ?138 copies/mL. A negative result does not preclude SARS-Cov-2 ?infection and should not be used as the sole basis for treatment or ?other patient management decisions. A negative result may occur with  ?improper specimen collection/handling, submission of specimen other ?than nasopharyngeal swab, presence of viral mutation(s) within the ?areas targeted by this assay, and inadequate number of viral ?copies(<138 copies/mL). A negative result must be combined with ?clinical observations, patient history, and epidemiological ?information. The expected result is Negative. ? ?Fact Sheet for Patients:  ?EntrepreneurPulse.com.au ? ?Fact Sheet for Healthcare Providers:  ?IncredibleEmployment.be ? ?This test is no t yet approved or cleared by the [place] FDA and  ?has been authorized for detection and/or diagnosis of SARS-CoV-2 by ?FDA under an Emergency Use Authorization (EUA). This EUA will remain  ?in effect (meaning this test can be used) for the duration of the ?COVID-19 declaration under Section 564(b)(1) of the Act,  21 ?U.S.C.section 360bbb-3(b)(1), unless the authorization is terminated  ?or revoked sooner.  ? ? ?  ? Influenza A by PCR NEGATIVE NEGATIVE Final  ? Influenza B by PCR NEGATIVE NEGATIVE Final  ?  Comment:

## 2021-09-07 NOTE — Evaluation (Signed)
Physical Therapy Evaluation ?Patient Details ?Name: Tiffany Bailey ?MRN: [id number] ?DOB: December 12, 1927 ?Today's Date: 09/07/2021 ? ?History of Present Illness ? Pt is a 88 y/o F admitted on 08/28/2021 with c/o still not feeling well. Pt admitted for treatment of sepsis. PMH: DM, CAD, HTN, recent hospitalization for UTI, CHF, breast CA, HOH, HLD, MI, PAD, L AKA  ?Clinical Impression ? Pt seen for PT evaluation with daughter reporting pt was independent with mobility, bed<>w/c, & w/c mobility in the home where her children provide 24 hr assistance prior to admission, except pt has been experiencing a functional decline over the past month. Pt requires encouragement for participation in OOB mobility. Pt with c/o RUE pain & pt noted to have edema that's beginning to weep. Pt requires max<>total assist for supine>sit & max assist squat pivot bed>recliner. PT provides total assist for repositioning in recliner. Pt declines exercises 2/2 fatigued but left in chair with daughter assisting pt to eat lunch. Pt is eager to return home with family assistance & would benefit from hospital bed & manual hoyer lift & sling.  ?   ? ?Recommendations for follow up therapy are one component of a multi-disciplinary discharge planning process, led by the attending physician.  Recommendations may be updated based on patient status, additional functional criteria and insurance authorization. ? ?Follow Up Recommendations Home health PT ? ?  ?Assistance Recommended at Discharge Frequent or constant Supervision/Assistance  ?Patient can return home with the following ? Two people to help with walking and/or transfers;Two people to help with bathing/dressing/bathroom;Direct supervision/assist for medications management;Help with stairs or ramp for entrance ? ?  ?Equipment Recommendations Hospital bed (manual hoyer lift & sling)  ?Recommendations for Other Services ?    ?  ?Functional Status Assessment Patient has had a recent decline in their  functional status and demonstrates the ability to make significant improvements in function in a reasonable and predictable amount of time.  ? ?  ?Precautions / Restrictions Precautions ?Precautions: Fall ?Precaution Comments: old L AKA ?Restrictions ?Weight Bearing Restrictions: No  ? ?  ? ?Mobility ? Bed Mobility ?Overal bed mobility: Needs Assistance ?Bed Mobility: Supine to Sit ?  ?  ?Supine to sit: Total assist, Max assist, HOB elevated ?  ?  ?General bed mobility comments: Pt with poor initiation, stating "I can't do it". Pt requires max<>total assist for supine>sit. ?  ? ?Transfers ?Overall transfer level: Needs assistance ?  ?Transfers: Bed to chair/wheelchair/BSC ?  ?  ?  ?Squat pivot transfers: Max assist (Max assist for squat pivot bed>recliner on R with PT blocking RLE but pt able to weight bear without issue, PT provides assistance for pivot portion.) ?  ?  ?  ?  ? ?Ambulation/Gait ?  ?  ?  ?  ?  ?  ?  ?  ? ?Stairs ?  ?  ?  ?  ?  ? ?Wheelchair Mobility ?  ? ?Modified Rankin (Stroke Patients Only) ?  ? ?  ? ?Balance Overall balance assessment: Needs assistance ?Sitting-balance support: Feet supported, Bilateral upper extremity supported ?Sitting balance-Leahy Scale: Poor ?Sitting balance - Comments: CGA static sitting EOB ?  ?  ?  ?  ?  ?  ?  ?  ?  ?  ?  ?  ?  ?  ?  ?   ? ? ? ?Pertinent Vitals/Pain Pain Assessment ?Pain Assessment: Faces ?Faces Pain Scale: Hurts even more ?Pain Location: RUE ?Pain Descriptors / Indicators: Discomfort ?Pain Intervention(s): Monitored during session,  Repositioned (Pt with RUE edema, blister near elbow. PT elevated RUE on pillow at end of session.)  ? ? ?Home Living Family/patient expects to be discharged to:: Private residence ?Living Arrangements: Children ?Available Help at Discharge: Family;Personal care attendant;Available PRN/intermittently ?Type of Home: House ?Home Access: Ramped entrance ?  ?  ?  ?Home Layout: One level ?Home Equipment: Rolling Walker (2  wheels);BSC/3in1;Wheelchair - manual;Shower seat;Grab bars - tub/shower;Grab bars - toilet;Wheelchair - power ?Additional Comments: pt reports she has a aid for 2 hrs in the morning, Mon-Fri; son assists at night as needed, plans to discharge home with her son  ?  ?Prior Function Prior Level of Function : Needs assist ?  ?  ?  ?  ?  ?  ?Mobility Comments: Daughter reports 1 month ago pt was independent in bed mobility & bed<>w/c transfers, propelling w/c around house & cooking from w/c level but has required more assistance over the past month 2/2 infections. ?ADLs Comments: pt requires assist for all ADLs since previous admission ?  ? ? ?Hand Dominance  ? Dominant Hand: Right ? ?  ?Extremity/Trunk Assessment  ? Upper Extremity Assessment ?Upper Extremity Assessment: Generalized weakness;RUE deficits/detail ?RUE Deficits / Details: RUE edema, blister area noted near elbow ?  ? ?Lower Extremity Assessment ?Lower Extremity Assessment: Generalized weakness;LLE deficits/detail ?LLE Deficits / Details: old L AKA, pt reports she has a prosthesis but doesn't use it ?  ? ?Cervical / Trunk Assessment ?Cervical / Trunk Assessment: Normal  ?Communication  ? Communication: No difficulties  ?Cognition Arousal/Alertness: Awake/alert ?Behavior During Therapy: Flat affect ?Overall Cognitive Status: Within Functional Limits for tasks assessed ?  ?  ?  ?  ?  ?  ?  ?  ?  ?  ?  ?  ?  ?  ?  ?  ?General Comments: requires encouragement from PT & daughter for OOB mobility ?  ?  ? ?  ?General Comments   ? ?  ?Exercises    ? ?Assessment/Plan  ?  ?PT Assessment Patient needs continued PT services  ?PT Problem List Decreased mobility;Decreased strength;Decreased activity tolerance;Decreased balance;Decreased knowledge of use of DME;Decreased cognition;Cardiopulmonary status limiting activity;Pain ? ?   ?  ?PT Treatment Interventions DME instruction;Functional mobility training;Therapeutic activities;Therapeutic exercise;Balance  training;Patient/family education;Wheelchair mobility training;Neuromuscular re-education   ? ?PT Goals (Current goals can be found in the Care Plan section)  ?Acute Rehab PT Goals ?Patient Stated Goal: to go home ?PT Goal Formulation: With patient/family ?Time For Goal Achievement: 09/21/21 ?Potential to Achieve Goals: Fair ? ?  ?Frequency Min 2X/week ?  ? ? ?Co-evaluation   ?  ?  ?  ?  ? ? ?  ?AM-PAC PT "6 Clicks" Mobility  ?Outcome Measure Help needed turning from your back to your side while in a flat bed without using bedrails?: A Lot ?Help needed moving from lying on your back to sitting on the side of a flat bed without using bedrails?: Total ?Help needed moving to and from a bed to a chair (including a wheelchair)?: Total ?Help needed standing up from a chair using your arms (e.g., wheelchair or bedside chair)?: Total ?Help needed to walk in hospital room?: Total ?Help needed climbing 3-5 steps with a railing? : Total ?6 Click Score: 7 ? ?  ?End of Session Equipment Utilized During Treatment: Gait belt ?Activity Tolerance: Patient limited by fatigue ?Patient left: with chair alarm set;in chair;with family/visitor present ?Nurse Communication: Mobility status ?PT Visit Diagnosis: Muscle weakness (generalized) (M62.81);Other abnormalities of  gait and mobility (R26.89) ?  ? ?Time: 1660-6301 ?PT Time Calculation (min) (ACUTE ONLY): 16 min ? ? ?Charges:   PT Evaluation ?$PT Eval Moderate Complexity: 1 Mod ?  ?  ?   ? ? ?Tiffany Bailey, PT, DPT ?09/07/21, 1:37 PM ? ? ?Waunita Schooner ?09/07/2021, 1:35 PM ? ?

## 2021-09-07 NOTE — Evaluation (Signed)
Occupational Therapy Evaluation ?Patient Details ?Name: Tiffany Bailey ?MRN: [id number] ?DOB: [birth year] ?Today's Date: 09/07/2021 ? ? ?History of Present Illness Pt is a 88 year old woman presents to ED with worsening weakness and inability to care for herself. PMH significant metastatic breast cancer currently engaging chemotherapy, type 2 diabetes, hypertension, PAD, CAD, CKD, history of L AKA.  ? ?Clinical Impression ?  ?Ms. Tiffany Bailey presents to the ER for the second time in 1 week. During today's evaluation, she demonstrates generalized weakness, fatigue, impaired balance, reduced endurance, and pain. She is A&O x 4, with flat affect, limited engagement. She requires Mod A for self-feeding, Mod-Max A for bed mobility, unable to maintain sitting balance on OOB. Pt reports having urge to urinate but being unable to do so and experiencing bladder pain and burning sensation; nurse notifed. Pt reports that she has an aide for 2 hrs in AM but that she is then home alone until evening, when her son returns from work. Prior to recent hospitalizations, pt has been able to feed, toilet, transfer, move through house with her power WC. At present, however, pt requires significant assistance with all ADL and would not be safe staying home by herself. Discussed DC options with pt, including recommendation for 24-hour assistance and SNF-level care. Pt states she has 3 sons and 3 daughters living nearby (1 daughter elsewhere) and that she wants to return home with her children providing care during the day. She also wonders if it would be possible for her aide to increase level of assistance to 4 hrs/day. Pt is currently requiring significantly more assistance with all ADL/IADL than previously. Recommend ongoing OT while pt is hospitalized, with DC home with children only if family is able to provide 24-hour care. Otherwise, would have to recommend DC to SNF.   ? ?Recommendations for follow up therapy are one component of a  multi-disciplinary discharge planning process, led by the attending physician.  Recommendations may be updated based on patient status, additional functional criteria and insurance authorization.  ? ?Follow Up Recommendations ? Skilled nursing-short term rehab (<3 hours/day)  ?  ?Assistance Recommended at Discharge Frequent or constant Supervision/Assistance  ?Patient can return home with the following A lot of help with walking and/or transfers;A lot of help with bathing/dressing/bathroom;Assist for transportation;Assistance with cooking/housework;Help with stairs or ramp for entrance;Direct supervision/assist for medications management;Assistance with feeding ? ?  ?Functional Status Assessment ? Patient has had a recent decline in their functional status and demonstrates the ability to make significant improvements in function in a reasonable and predictable amount of time.  ?Equipment Recommendations ? None recommended by OT  ?  ?Recommendations for Other Services   ? ? ?  ?Precautions / Restrictions Precautions ?Precautions: Fall ?Restrictions ?Weight Bearing Restrictions: No  ? ?  ? ?Mobility Bed Mobility ?Overal bed mobility: Needs Assistance ?Bed Mobility: Supine to Sit, Rolling, Sit to Supine ?Rolling: Mod assist ?  ?Supine to sit: Max assist ?Sit to supine: Max assist ?  ?General bed mobility comments: Requires support at trunk and legs for supine<>sit, with pain in LLE shooting up with movement ?  ? ?Transfers ?Overall transfer level: Needs assistance ?  ?  ?  ?  ?  ?  ?  ?  ?General transfer comment: OOB transfer not attempted, per pt request, 2/2 pain ?  ? ?  ?Balance Overall balance assessment: Needs assistance ?Sitting-balance support: Bilateral upper extremity supported ?Sitting balance-Leahy Scale: Poor ?  ?  ?  ?Standing balance-Leahy Scale:  Zero ?  ?  ?  ?  ?  ?  ?  ?  ?  ?  ?  ?  ?   ? ?ADL either performed or assessed with clinical judgement  ? ?ADL Overall ADL's : Needs  assistance/impaired ?Eating/Feeding: Moderate assistance ?Eating/Feeding Details (indicate cue type and reason): Pt w/ tremors, weakness, requiring Mod A for bring straw, utensils to mouth ?Grooming: Wash/dry hands;Wash/dry face;Minimal assistance ?  ?  ?  ?  ?  ?  ?  ?  ?  ?  ?  ?  ?  ?  ?  ?  ?General ADL Comments: Anticipate Max A for OOB fxl mobility  ? ? ? ?Vision Patient Visual Report: No change from baseline ?   ?   ?Perception   ?  ?Praxis   ?  ? ?Pertinent Vitals/Pain Pain Assessment ?Faces Pain Scale: Hurts whole lot ?Pain Location: abdomen, LLE, intensified with movement ?Pain Descriptors / Indicators: Guarding, Moaning, Grimacing ?Pain Intervention(s): Limited activity within patient's tolerance, Monitored during session, Repositioned  ? ? ? ?Hand Dominance Right ?  ?Extremity/Trunk Assessment Upper Extremity Assessment ?Upper Extremity Assessment: Generalized weakness ?  ?Lower Extremity Assessment ?Lower Extremity Assessment: Generalized weakness ?LLE Deficits / Details: mature transfemoral amputation, patient does not wear prosthesis per her report ?  ?  ?  ?Communication Communication ?Communication: No difficulties ?  ?Cognition Arousal/Alertness: Awake/alert ?Behavior During Therapy: Flat affect ?Overall Cognitive Status: Within Functional Limits for tasks assessed ?  ?  ?  ?  ?  ?  ?  ?  ?  ?  ?  ?  ?  ?  ?  ?  ?General Comments: alert and oriented x4 ?  ?  ?General Comments    ? ?  ?Exercises Other Exercises ?Other Exercises: Educ re: role of OT, POC, DC recs ?  ?Shoulder Instructions    ? ? ?Home Living Family/patient expects to be discharged to:: Private residence ?Living Arrangements: Children ?Available Help at Discharge: Family;Personal care attendant;Available PRN/intermittently ?Type of Home: House ?Home Access: Ramped entrance ?  ?  ?Home Layout: One level ?  ?  ?Bathroom Shower/Tub: Tub/shower unit ?  ?Bathroom Toilet: Standard ?Bathroom Accessibility: No ?How Accessible: Accessible via  wheelchair ?Home Equipment: Rolling Walker (2 wheels);BSC/3in1;Wheelchair - manual;Shower seat;Grab bars - tub/shower;Grab bars - toilet;Wheelchair - power ?  ?Additional Comments: pt reports she has a aid for 2 hrs in the morning, Mon-Fri; son assists at night as needed, plans to discharge home with her son ?  ? ?  ?Prior Functioning/Environment Prior Level of Function : Needs assist ?  ?  ?  ?  ?  ?  ?Mobility Comments: pt reports squat pivot vs stand pivot transfer at baseline, mod i with mwc, has become weaker since previous admission requiring assitance for all mobility. ?ADLs Comments: pt requires assist for all ADLs since previous admission ?  ? ?  ?  ?OT Problem List: Decreased strength;Decreased activity tolerance;Impaired balance (sitting and/or standing);Decreased safety awareness;Decreased knowledge of use of DME or AE ?  ?   ?OT Treatment/Interventions: Self-care/ADL training;Therapeutic exercise;DME and/or AE instruction;Balance training;Manual therapy;Patient/family education;Energy conservation;Therapeutic activities  ?  ?OT Goals(Current goals can be found in the care plan section) Acute Rehab OT Goals ?Patient Stated Goal: to go home ?OT Goal Formulation: With patient ?Time For Goal Achievement: 09/21/21 ?Potential to Achieve Goals: Good ?ADL Goals ?Pt Will Perform Eating: with supervision;sitting ?Pt Will Perform Grooming: sitting;with supervision ?Pt Will Transfer to Toilet: with  min assist;stand pivot transfer  ?OT Frequency: Min 2X/week ?  ? ?Co-evaluation   ?  ?  ?  ?  ? ?  ?AM-PAC OT "6 Clicks" Daily Activity     ?Outcome Measure Help from another person eating meals?: A Lot ?Help from another person taking care of personal grooming?: A Little ?Help from another person toileting, which includes using toliet, bedpan, or urinal?: A Lot ?Help from another person bathing (including washing, rinsing, drying)?: A Lot ?Help from another person to put on and taking off regular upper body clothing?: A  Lot ?Help from another person to put on and taking off regular lower body clothing?: A Lot ?6 Click Score: 13 ?  ?End of Session Nurse Communication: Other (comment) (Pt experiencing pain, burning with urination) ? ?Activity Kathee Polite

## 2021-09-07 NOTE — Progress Notes (Signed)
Initial Nutrition Assessment ? ?DOCUMENTATION CODES:  ? ?Severe malnutrition in context of chronic illness ? ?INTERVENTION:  ?- Liberalize diet from a heart healthy to a regular diet to provide widest variety of menu options to enhance nutritional adequacy ? ?- Ensure Enlive po BID, each supplement provides 350 kcal and 20 grams of protein. ? ?- Magic cup TID with meals, each supplement provides 290 kcal and 9 grams of protein ? ?- Recommend bowel regimen ? ?NUTRITION DIAGNOSIS:  ? ?Severe Malnutrition related to chronic illness (UTI, CHF, FTT, metastatic breast CA) as evidenced by energy intake < or equal to 75% for > or equal to 1 month, severe muscle depletion, moderate fat depletion. ? ?GOAL:  ? ?Patient will meet greater than or equal to 90% of their needs ? ?MONITOR:  ? ?PO intake, Supplement acceptance, Diet advancement, Labs, Weight trends ? ?REASON FOR ASSESSMENT:  ? ?Malnutrition Screening Tool ?  ? ?ASSESSMENT:  ? ?Pt admitted with sepsis. PMH significant for diabetes, CAD, HTN, recent hospitalization for UTI and d/c 4 days ago, CHF, failure to thrive, metastatic breast cancer. ? ?Pt resting in bed with family member at bedside who provided most history. Pt expressed presence of stomach pain. She reports pt has difficulty with constipation and diarrhea. Noted no BM in 5 days. Pt has been eating very minimally for several weeks. She enjoys Ensure but is not drinking them. Observed uneaten breakfast tray on side table. Pt was started on Megace a month ago and has no noticeable improvement in appetite.  ? ?Reviewed weight history. Weights appear stable at 95 lbs. Will continue to monitor throughout admission. ? ?Medications: lokelma, IV abx, LR @ 176m/hr ? ?Labs: potassium 5.3, BUN 54, Cr 1.58, Phos 4.5 (WNL), Ast 902,  ? ALT 589, CBG's 156-161 x 24 hours ? ?I/O's: +1.3L since admission ? ?NUTRITION - FOCUSED PHYSICAL EXAM: ? ?Flowsheet Row Most Recent Value  ?Orbital Region Moderate depletion  ?Upper Arm  Region Severe depletion  ?Thoracic and Lumbar Region Moderate depletion  ?Buccal Region Moderate depletion  ?Temple Region Moderate depletion  ?Clavicle Bone Region Severe depletion  ?Clavicle and Acromion Bone Region Severe depletion  ?Scapular Bone Region Severe depletion  ?Dorsal Hand Severe depletion  ?Patellar Region Severe depletion  ?Anterior Thigh Region Severe depletion  ?Posterior Calf Region Severe depletion  ?Edema (RD Assessment) Moderate  [RUE, RLE]  ?Hair Reviewed  ?Eyes Reviewed  ?Mouth Reviewed  ?Skin Reviewed  ?Nails Reviewed  ? ?  ? ? ?Diet Order:   ?Diet Order   ? ?       ?  Diet regular Room service appropriate? Yes; Fluid consistency: Thin  Diet effective now       ?  ? ?  ?  ? ?  ? ? ?EDUCATION NEEDS:  ? ?Education needs have been addressed ? ?Skin:  Skin Assessment: Skin Integrity Issues: ?Skin Integrity Issues:: Stage II ?Stage II: buttocks ? ?Last BM:  3/18 ? ?Height:  ? ?Ht Readings from Last 1 Encounters:  ?09/01/2021 5' (1.524 m)  ? ? ?Weight:  ? ?Wt Readings from Last 1 Encounters:  ?09/08/2021 43 kg  ? ?BMI:  Body mass index is 18.51 kg/m?. ? ?Estimated Nutritional Needs:  ? ?Kcal:  1300-1500 ? ?Protein:  65-80g ? ?Fluid:  >/=1.5L ? ?AClayborne Dana RDN, LDN ?Clinical Nutrition ?

## 2021-09-08 DIAGNOSIS — N3000 Acute cystitis without hematuria: Secondary | ICD-10-CM | POA: Diagnosis not present

## 2021-09-08 DIAGNOSIS — A419 Sepsis, unspecified organism: Secondary | ICD-10-CM | POA: Diagnosis not present

## 2021-09-08 DIAGNOSIS — R7401 Elevation of levels of liver transaminase levels: Secondary | ICD-10-CM | POA: Diagnosis not present

## 2021-09-08 DIAGNOSIS — R6520 Severe sepsis without septic shock: Secondary | ICD-10-CM | POA: Diagnosis not present

## 2021-09-08 LAB — CK: Total CK: 25169 U/L — ABNORMAL HIGH (ref 38–234)

## 2021-09-08 LAB — CBC
HCT: 25.8 % — ABNORMAL LOW (ref 36.0–46.0)
Hemoglobin: 8.3 g/dL — ABNORMAL LOW (ref 12.0–15.0)
MCH: 31.2 pg (ref 26.0–34.0)
MCHC: 32.2 g/dL (ref 30.0–36.0)
MCV: 97 fL (ref 80.0–100.0)
Platelets: 430 10*3/uL — ABNORMAL HIGH (ref 150–400)
RBC: 2.66 MIL/uL — ABNORMAL LOW (ref 3.87–5.11)
RDW: 15.1 % (ref 11.5–15.5)
WBC: 32.7 10*3/uL — ABNORMAL HIGH (ref 4.0–10.5)
nRBC: 0.1 % (ref 0.0–0.2)

## 2021-09-08 LAB — GLUCOSE, CAPILLARY
Glucose-Capillary: 180 mg/dL — ABNORMAL HIGH (ref 70–99)
Glucose-Capillary: 243 mg/dL — ABNORMAL HIGH (ref 70–99)

## 2021-09-08 LAB — COMPREHENSIVE METABOLIC PANEL
ALT: 590 U/L — ABNORMAL HIGH (ref 0–44)
AST: 672 U/L — ABNORMAL HIGH (ref 15–41)
Albumin: 2.9 g/dL — ABNORMAL LOW (ref 3.5–5.0)
Alkaline Phosphatase: 87 U/L (ref 38–126)
Anion gap: 11 (ref 5–15)
BUN: 57 mg/dL — ABNORMAL HIGH (ref 8–23)
CO2: 17 mmol/L — ABNORMAL LOW (ref 22–32)
Calcium: 9.1 mg/dL (ref 8.9–10.3)
Chloride: 110 mmol/L (ref 98–111)
Creatinine, Ser: 1.4 mg/dL — ABNORMAL HIGH (ref 0.44–1.00)
GFR, Estimated: 36 mL/min — ABNORMAL LOW (ref >60)
Glucose, Bld: 208 mg/dL — ABNORMAL HIGH (ref 70–99)
Potassium: 5.5 mmol/L — ABNORMAL HIGH (ref 3.5–5.1)
Sodium: 138 mmol/L (ref 135–145)
Total Bilirubin: 0.9 mg/dL (ref 0.3–1.2)
Total Protein: 5.6 g/dL — ABNORMAL LOW (ref 6.5–8.1)

## 2021-09-08 LAB — POTASSIUM: Potassium: 5.3 mmol/L — ABNORMAL HIGH (ref 3.5–5.1)

## 2021-09-08 LAB — HEPATITIS PANEL, ACUTE
HCV Ab: NONREACTIVE
Hep A IgM: NONREACTIVE
Hep B C IgM: NONREACTIVE
Hepatitis B Surface Ag: NONREACTIVE

## 2021-09-08 MED ORDER — CHLORHEXIDINE GLUCONATE CLOTH 2 % EX PADS
6.0000 | MEDICATED_PAD | Freq: Every day | CUTANEOUS | Status: DC
  Administered 2021-09-08 – 2021-09-14 (×6): 6 via TOPICAL

## 2021-09-08 MED ORDER — SODIUM BICARBONATE 8.4 % IV SOLN
50.0000 meq | Freq: Once | INTRAVENOUS | Status: AC
  Administered 2021-09-08: 50 meq via INTRAVENOUS
  Filled 2021-09-08: qty 50

## 2021-09-08 MED ORDER — SODIUM ZIRCONIUM CYCLOSILICATE 10 G PO PACK
10.0000 g | PACK | Freq: Once | ORAL | Status: DC
  Filled 2021-09-08: qty 1

## 2021-09-08 MED ORDER — FUROSEMIDE 10 MG/ML IJ SOLN
40.0000 mg | Freq: Once | INTRAMUSCULAR | Status: AC
  Administered 2021-09-08: 40 mg via INTRAVENOUS
  Filled 2021-09-08: qty 4

## 2021-09-08 NOTE — Progress Notes (Addendum)
?PROGRESS NOTE ? ? ? ?Tiffany Bailey  TJQ:[id number] DOB: March 08, 1928 DOA: 08/29/2021 ?PCP: [name], FNP  ? ? ? ?Assessment & Plan: ?  ?Principal Problem: ?  Sepsis ([place]) ?Active Problems: ?  DM (diabetes mellitus) ([place]) ?  Essential hypertension ?  CAD (coronary artery disease), native coronary artery ?  GERD (gastroesophageal reflux disease) ?  PAD (peripheral artery disease) ([place]) ?  Hx of AKA (above knee amputation), left ([place]) ?  Anxiety and depression ?  Acute kidney injury superimposed on chronic kidney disease ([place]) ?  Metastatic breast cancer ([place]) ?  Hb-SS disease without crisis ([place]) ?  Hyperkalemia ?  Goals of care, counseling/discussion ?  Pleural effusion ?  UTI (urinary tract infection) ?  Physical deconditioning ?  Transaminitis ?  Failure to thrive in adult ?  Dehydration ?  Elevated liver enzymes ?  Protein-calorie malnutrition, severe ? ? ?Sepsis: met criteria w/ leukocytosis, tachypnea & likely UTI. Continue on IV abxs. Blood cxs NGTD. Urine cx is pending. Procal 0.36. ? ?Transaminitis: etiology unclear, possibly shock liver. Hepatitis panel is pending. [place] of RUQ was normal. Still elevated but trending down  ? ?Hyperkalemia: will given lokelma again today. Bicarb & lasix ordered. Repeat potassium ordered  ?  ?AKI on CKD: baseline Cr/GFR is unknown, currently stage IIIb. Likely secondary to rhabdomyolysis. Cr is trending down today  ? ?Possible pneumonia: repeat CXR shows worse patchy opacities on the mid right perihilar area down. Continue on IV cefepime, azithromycin, bronchodilators & encourage incentive spirometry. Strep is neg. Legionella is pending.  ? ?Rhabdomyolysis:  significant. Etiology unclear. Still quite elevated but trending down. Continue on IVFs ?  ?UTI: urine cx is pending. Continue on IV cefepime  ? ?Leukocytosis: significant. Possibly secondary to above infection. Continue on IV abxs ? ?DM2: HbA1c 8.5, poorly controlled. Continue on SSI w/ accuchecks. Hx of L BKA ?   ?Metastatic breast cancer:  management per onco as an outpatient  ?  ?Pleural effusion: small as per XR. Still not requiring supplemental oxygen   ? ?Failure to thrive in adult: w/ poor po intake as well. Palliative care consulted   ? ?Unlikely Hb-SS disease: no hx of sickle cell anemia as per pt's son, [name] ? ?Severe protein calorie malnutrition: pt is refusing nutritional supplements  ? ? ?DVT prophylaxis: heparin ?Code Status: DNR ?Family Communication: discussed pt's care w/ pt's daughter, [name], and answered her questions  ?Disposition Plan:  unclear ? ?Level of care: Progressive ? ?Status is: Inpatient ?Remains inpatient appropriate because: severity of illness ? ? ? ?Consultants:  ?Palliative care ? ?Procedures:  ? ?Antimicrobials: cefepime, azithromycin  ? ? ?Subjective: ?Pt c/o sputum production  ? ?Objective: ?Vitals:  ? 09/07/21 2331 09/08/21 7622 09/08/21 0247 09/08/21 0735  ?BP: (!) 179/64 (!) 177/65 (!) 159/48 (!) 166/65  ?Pulse: 97 91 91 (!) 102  ?Resp: 20   18  ?Temp: 98.6 ?F (37 ?C)   98.2 ?F (36.8 ?C)  ?TempSrc:      ?SpO2: 99%   98%  ?Weight:      ?Height:      ? ? ?Intake/Output Summary (Last 24 hours) at 09/08/2021 0745 ?Last data filed at 09/08/2021 0241 ?Gross per 24 hour  ?Intake 1217.87 ml  ?Output 800 ml  ?Net 417.87 ml  ? ?Filed Weights  ? 09/13/2021 1115  ?Weight: 43 kg  ? ? ?Examination: ? ?General exam: Appears uncomfortable. Frail appearing  ?Respiratory system: diminished breath sounds b/l  ?Cardiovascular system: S1/S2+. No  rubs or clicks ?Gastrointestinal system: Abd is soft, NT, ND & hypoactive bowel sounds ?Central nervous system: alert and oriented. Moves all extremities  ?Psychiatry:judgement and insight appear at baseline. Flat mood and affect ? ? ? ?Data Reviewed: I have personally reviewed following labs and imaging studies ? ?CBC: ?Recent Labs  ?Lab 09/02/21 ?1610 08/22/2021 ?1126 09/07/21 ?0441 09/08/21 ?0355  ?WBC 11.6* 25.4* 30.5* 32.7*  ?HGB 7.8* 9.0* 9.6* 8.3*  ?HCT 23.6*  28.3* 29.6* 25.8*  ?MCV 94.4 99.3 95.5 97.0  ?PLT 302 418* 501* 430*  ? ?Basic Metabolic Panel: ?Recent Labs  ?Lab 09/02/21 ?9604 09/12/2021 ?1126 08/15/2021 ?1955 09/07/21 ?0441 09/08/21 ?0355  ?NA 134* 137 136 138 138  ?K 5.2* 5.5* 5.3* 5.3* 5.5*  ?CL 110 110 110 109 110  ?CO2 19* 17* 15* 17* 17*  ?GLUCOSE 186* 164* 163* 145* 208*  ?BUN 32* 55* 54* 54* 57*  ?CREATININE 1.34* 1.80* 1.53* 1.58* 1.40*  ?CALCIUM 8.6* 9.0 9.1 9.2 9.1  ?PHOS  --   --  4.5  --   --   ? ?GFR: ?Estimated Creatinine Clearance: 17.8 mL/min (A) (by C-G formula based on SCr of 1.4 mg/dL (H)). ?Liver Function Tests: ?Recent Labs  ?Lab 09/01/2021 ?1126 08/25/2021 ?1955 09/07/21 ?0441 09/08/21 ?0355  ?AST 759* 889* 902* 672*  ?ALT 477* 572* 589* 590*  ?ALKPHOS 61 80 76 87  ?BILITOT 0.7 0.8 0.7 0.9  ?PROT 5.8* 6.4* 6.1* 5.6*  ?ALBUMIN 2.8* 3.1* 3.1* 2.9*  ? ?No results for input(s): LIPASE, AMYLASE in the last 168 hours. ?No results for input(s): AMMONIA in the last 168 hours. ?Coagulation Profile: ?Recent Labs  ?Lab 09/07/21 ?0441  ?INR 1.1  ? ?Cardiac Enzymes: ?Recent Labs  ?Lab 08/30/2021 ?1953 09/08/21 ?5409  ?CKTOTAL [id number]* 25,169*  ? ?BNP (last 3 results) ?No results for input(s): PROBNP in the last 8760 hours. ?HbA1C: ?No results for input(s): HGBA1C in the last 72 hours. ?CBG: ?Recent Labs  ?Lab 09/01/21 ?1727 09/01/21 ?2116 09/02/21 ?0825 08/19/2021 ?1857 09/07/2021 ?2042  ?GLUCAP 231* 232* 153* 156* 161*  ? ?Lipid Profile: ?No results for input(s): CHOL, HDL, LDLCALC, TRIG, CHOLHDL, LDLDIRECT in the last 72 hours. ?Thyroid Function Tests: ?No results for input(s): TSH, T4TOTAL, FREET4, T3FREE, THYROIDAB in the last 72 hours. ?Anemia Panel: ?Recent Labs  ?  08/17/2021 ?1126 08/23/2021 ?1955  ?FERRITIN  --  396*  ?TIBC  --  202*  ?IRON  --  51  ?RETICCTPCT 2.2  --   ? ?Sepsis Labs: ?Recent Labs  ?Lab 08/18/2021 ?1333 09/12/2021 ?1955 09/07/21 ?0441  ?PROCALCITON  --   --  0.36  ?LATICACIDVEN 1.6 1.4  --   ? ? ?Recent Results (from the past 240 hour(s))   ?Culture, blood (routine x 2)     Status: None (Preliminary result)  ? Collection Time: 09/03/2021  1:33 PM  ? Specimen: BLOOD  ?Result Value Ref Range Status  ? Specimen Description BLOOD RIGHT FATTY CASTS  Final  ? Special Requests   Final  ?  BOTTLES DRAWN AEROBIC AND ANAEROBIC Blood Culture adequate volume  ? Culture   Final  ?  NO GROWTH 2 DAYS ?Performed at [facility], [address] [place], [place] [address] ?  ? Report Status PENDING  Incomplete  ?Resp Panel by RT-PCR (Flu A&B, Covid) Nasopharyngeal Swab     Status: None  ? Collection Time: 09/07/2021  3:17 PM  ? Specimen: Nasopharyngeal Swab; Nasopharyngeal(NP) swabs in vial transport medium  ?Result Value Ref Range Status  ?  SARS Coronavirus 2 by RT PCR NEGATIVE NEGATIVE Final  ?  Comment: (NOTE) ?SARS-CoV-2 target nucleic acids are NOT DETECTED. ? ?The SARS-CoV-2 RNA is generally detectable in upper respiratory ?specimens during the acute phase of infection. The lowest ?concentration of SARS-CoV-2 viral copies this assay can detect is ?138 copies/mL. A negative result does not preclude SARS-Cov-2 ?infection and should not be used as the sole basis for treatment or ?other patient management decisions. A negative result may occur with  ?improper specimen collection/handling, submission of specimen other ?than nasopharyngeal swab, presence of viral mutation(s) within the ?areas targeted by this assay, and inadequate number of viral ?copies(<138 copies/mL). A negative result must be combined with ?clinical observations, patient history, and epidemiological ?information. The expected result is Negative. ? ?Fact Sheet for Patients:  ?EntrepreneurPulse.com.au ? ?Fact Sheet for Healthcare Providers:  ?IncredibleEmployment.be ? ?This test is no t yet approved or cleared by the [place] FDA and  ?has been authorized for detection and/or diagnosis of SARS-CoV-2 by ?FDA under an Emergency Use Authorization (EUA).  This EUA will remain  ?in effect (meaning this test can be used) for the duration of the ?COVID-19 declaration under Section 564(b)(1) of the Act, 21 ?U.S.C.section 360bbb-3(b)(1), unless the authorization is termin

## 2021-09-08 NOTE — Progress Notes (Addendum)
Text page sent to Sharion Settler NP due to patient not voiding this shift. Bladder palpated and bladder scan reads 738. Was previously in and out cath on day shift. Patient says she feels tender to lower abdomen and she feels like she has to pee but nothing will come out. Advised NP if she wants her to have another in and out or can she have an indwelling foley. Also updated NP on her elevated sbp 170s . Awaiting advisement ? ?Ordered to do in and out cath. 800cc collected of transparent tea colored urine with sedimentation.  ?

## 2021-09-08 NOTE — Progress Notes (Signed)
6 Gold colored rings with various colored stones removed from PT's hand's without injury and PT agreed to give them to Dorna Leitz (Son). [name] provided with zip-lock. Rings to be taken to his home. This was in response to swelling to her right arm from an IV that infiltrated 03/24. ?

## 2021-09-09 ENCOUNTER — Inpatient Hospital Stay: Payer: Medicare Other

## 2021-09-09 DIAGNOSIS — N189 Chronic kidney disease, unspecified: Secondary | ICD-10-CM | POA: Diagnosis not present

## 2021-09-09 DIAGNOSIS — D72828 Other elevated white blood cell count: Secondary | ICD-10-CM

## 2021-09-09 DIAGNOSIS — R7401 Elevation of levels of liver transaminase levels: Secondary | ICD-10-CM | POA: Diagnosis not present

## 2021-09-09 DIAGNOSIS — N179 Acute kidney failure, unspecified: Secondary | ICD-10-CM | POA: Diagnosis not present

## 2021-09-09 LAB — CBC WITH DIFFERENTIAL/PLATELET
Abs Immature Granulocytes: 1.37 10*3/uL — ABNORMAL HIGH (ref 0.00–0.07)
Basophils Absolute: 0.1 10*3/uL (ref 0.0–0.1)
Basophils Relative: 0 %
Eosinophils Absolute: 0 10*3/uL (ref 0.0–0.5)
Eosinophils Relative: 0 %
HCT: 25.7 % — ABNORMAL LOW (ref 36.0–46.0)
Hemoglobin: 8.3 g/dL — ABNORMAL LOW (ref 12.0–15.0)
Immature Granulocytes: 4 %
Lymphocytes Relative: 3 %
Lymphs Abs: 1.1 10*3/uL (ref 0.7–4.0)
MCH: 31.9 pg (ref 26.0–34.0)
MCHC: 32.3 g/dL (ref 30.0–36.0)
MCV: 98.8 fL (ref 80.0–100.0)
Monocytes Absolute: 1.9 10*3/uL — ABNORMAL HIGH (ref 0.1–1.0)
Monocytes Relative: 5 %
Neutro Abs: 31.9 10*3/uL — ABNORMAL HIGH (ref 1.7–7.7)
Neutrophils Relative %: 88 %
Platelets: 433 10*3/uL — ABNORMAL HIGH (ref 150–400)
RBC: 2.6 MIL/uL — ABNORMAL LOW (ref 3.87–5.11)
RDW: 15.3 % (ref 11.5–15.5)
Smear Review: NORMAL
WBC: 36.4 10*3/uL — ABNORMAL HIGH (ref 4.0–10.5)
nRBC: 0.1 % (ref 0.0–0.2)

## 2021-09-09 LAB — URINE CULTURE: Culture: NO GROWTH

## 2021-09-09 LAB — COMPREHENSIVE METABOLIC PANEL
ALT: 650 U/L — ABNORMAL HIGH (ref 0–44)
AST: 591 U/L — ABNORMAL HIGH (ref 15–41)
Albumin: 2.7 g/dL — ABNORMAL LOW (ref 3.5–5.0)
Alkaline Phosphatase: 86 U/L (ref 38–126)
Anion gap: 12 (ref 5–15)
BUN: 63 mg/dL — ABNORMAL HIGH (ref 8–23)
CO2: 20 mmol/L — ABNORMAL LOW (ref 22–32)
Calcium: 9.2 mg/dL (ref 8.9–10.3)
Chloride: 110 mmol/L (ref 98–111)
Creatinine, Ser: 1.48 mg/dL — ABNORMAL HIGH (ref 0.44–1.00)
GFR, Estimated: 33 mL/min — ABNORMAL LOW (ref >60)
Glucose, Bld: 267 mg/dL — ABNORMAL HIGH (ref 70–99)
Potassium: 4.7 mmol/L (ref 3.5–5.1)
Sodium: 142 mmol/L (ref 135–145)
Total Bilirubin: 1 mg/dL (ref 0.3–1.2)
Total Protein: 5.7 g/dL — ABNORMAL LOW (ref 6.5–8.1)

## 2021-09-09 LAB — PROCALCITONIN: Procalcitonin: 0.85 ng/mL

## 2021-09-09 LAB — CBC
HCT: 24.8 % — ABNORMAL LOW (ref 36.0–46.0)
Hemoglobin: 8.2 g/dL — ABNORMAL LOW (ref 12.0–15.0)
MCH: 31.4 pg (ref 26.0–34.0)
MCHC: 33.1 g/dL (ref 30.0–36.0)
MCV: 95 fL (ref 80.0–100.0)
Platelets: 437 10*3/uL — ABNORMAL HIGH (ref 150–400)
RBC: 2.61 MIL/uL — ABNORMAL LOW (ref 3.87–5.11)
RDW: 15.4 % (ref 11.5–15.5)
WBC: 36.1 10*3/uL — ABNORMAL HIGH (ref 4.0–10.5)
nRBC: 0 % (ref 0.0–0.2)

## 2021-09-09 LAB — CK: Total CK: 14476 U/L — ABNORMAL HIGH (ref 38–234)

## 2021-09-09 LAB — MRSA NEXT GEN BY PCR, NASAL: MRSA by PCR Next Gen: NOT DETECTED

## 2021-09-09 MED ORDER — OSMOLITE 1.2 CAL PO LIQD
1000.0000 mL | ORAL | Status: DC
  Administered 2021-09-09: 1000 mL

## 2021-09-09 MED ORDER — VANCOMYCIN HCL IN DEXTROSE 1-5 GM/200ML-% IV SOLN
1000.0000 mg | Freq: Once | INTRAVENOUS | Status: AC
  Administered 2021-09-09: 1000 mg via INTRAVENOUS
  Filled 2021-09-09: qty 200

## 2021-09-09 NOTE — TOC Initial Note (Signed)
Transition of Care (TOC) - Initial/Assessment Note  ? ? ?Patient Details  ?Name: Tiffany Bailey ?MRN: [id number] ?Date of Birth: [birth year] ? ?Transition of Care (TOC) CM/SW Contact:    ?Adelene Amas, LCSW ?Phone Number: [phone] ?09/09/2021, 11:54 AM ? ?Clinical Narrative:                 ? ?Patient presents to [facility] due to failure to thrive and deterioration over the past several months.  Patient needs assistance with ADLs.  Patient has dx of cancer.  Patient's main contact is Esteban,[name] [phone] [facility]). Patient is currently not medically stable for discharge.  TOC will continue to follow. ? ?Expected Discharge Plan: [place] ?Barriers to Discharge: Continued Medical Work up ? ? ?Patient Goals and CMS Choice ?  ?  ?  ? ?Expected Discharge Plan and Services ?Expected Discharge Plan: [employer] [facility] ?  ?  ?Post Acute Care Choice: Home Health ?Living arrangements for the past 2 months: [place] ?                ?  ?  ?  ?  ?  ?  ?  ?  ?  ?  ? ?Prior Living Arrangements/Services ?Living arrangements for the past 2 months: [place] ?Lives with:: Adult Children ?Patient language and need for interpreter reviewed:: Yes ?Do you feel safe going back to the place where you live?: Yes      ?Need for Family Participation in Patient Care: Yes (Comment) ?Care giver support system in place?: Yes (comment) ?Current home services: Home OT, Home PT ([place]) ?Criminal Activity/Legal Involvement Pertinent to Current Situation/Hospitalization: No - Comment as needed ? ?Activities of Daily Living ?Home Assistive Devices/Equipment: Wheelchair ?ADL Screening (condition at time of admission) ?Patient's cognitive ability adequate to safely complete daily activities?: Yes ?Is the patient deaf or have difficulty hearing?: Yes ?Does the patient have difficulty seeing, even when wearing glasses/contacts?: Yes ?Does the patient have difficulty concentrating, remembering,  or making decisions?: No ?Patient able to express need for assistance with ADLs?: Yes ?Does the patient have difficulty dressing or bathing?: No ?Independently performs ADLs?: Yes (appropriate for developmental age) ?Communication: Independent ?Dressing (OT): Independent, Needs assistance ?Does the patient have difficulty walking or climbing stairs?: Yes ?Weakness of Legs: Right ?Weakness of Arms/Hands: Both ? ?Permission Sought/Granted ?Permission sought to share information with : Customer service manager ?  ? Share Information with NAME: Derin, Matthes (Son)   [phone] [facility]) ?   ?   ?   ? ?Emotional Assessment ?Appearance:: Appears stated age ?Attitude/Demeanor/Rapport: Unable to Assess ?Affect (typically observed): Unable to Assess ?Orientation: : Oriented to Self, Oriented to Place ?Alcohol / Substance Use: Not Applicable ?Psych Involvement: No (comment) ? ?Admission diagnosis:  Dehydration [E86.0] ?Weakness [R53.1] ?Elevated liver enzymes [R74.8] ?Sepsis ([place]) [A41.9] ?Patient Active Problem List  ? Diagnosis Date Noted  ? Protein-calorie malnutrition, severe 09/07/2021  ? Sepsis ([place]) 09/13/2021  ? Transaminitis 09/02/2021  ? Failure to thrive in adult 08/16/2021  ? Dehydration   ? Elevated liver enzymes   ? Failure to thrive (child)   ? Lethargic 08/31/2021  ? UTI (urinary tract infection) 08/31/2021  ? Physical deconditioning 08/31/2021  ? Pressure injury of sacral region, stage 2 ([place]) 08/24/2021  ? Hyperglycemia 04/26/2021  ? Encounter for antineoplastic chemotherapy 04/12/2021  ? Pressure injury of skin 03/31/2021  ? Malignant neoplasm of breast in female, estrogen receptor positive ([place]) 03/30/2021  ? Pleural effusion  03/14/2021  ? Cough 02/13/2021  ? Cancer ([place]) 11/07/2020  ? Type 2 diabetes mellitus with diabetic neuropathy, unspecified ([place]) 11/02/2020  ? Osteopenia 10/25/2019  ? Use of anastrozole (Arimidex) 08/18/2019  ? Anemia due to stage 3a chronic kidney disease ([place]) 08/18/2019  ?  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 Nausea 08/03/2019  ? Breast cancer ([place]) 07/19/2019  ? Goals of care, counseling/discussion 07/19/2019  ? Lung mass 02/10/2019  ? Constipation 01/27/2019  ? Headache 01/25/2019  ? Right leg swelling 01/25/2019  ? Metastatic breast cancer ([place]) 01/05/2019  ? Anxiety and depression 12/16/2018  ? Hx of AKA (above knee amputation), left ([place]) 08/26/2018  ? Left arm swelling 07/01/2018  ? Carotid stenosis 12/19/2017  ? CAD (coronary artery disease) 10/23/2017  ? Hb-SS disease without crisis ([place]) 10/23/2017  ? History of CHF (congestive heart failure) 10/23/2017  ? Old myocardial infarction 10/23/2017  ? Hand cramp 10/17/2017  ? Acute non-recurrent maxillary sinusitis 10/17/2017  ? Skin lesion 09/29/2017  ? Diarrhea 04/23/2017  ? Weight loss 09/12/2016  ? PAD (peripheral artery disease) ([place]) 08/14/2016  ? DM (diabetes mellitus) ([place]) 03/25/2016  ? Chronic tension-type headache, not intractable 03/25/2016  ? Essential hypertension 03/25/2016  ? CAD (coronary artery disease), native coronary artery 03/25/2016  ? GERD (gastroesophageal reflux disease) 03/25/2016  ? Hyperlipidemia 03/25/2016  ? History of breast cancer 03/25/2016  ? Acute kidney injury superimposed on chronic kidney disease ([place]) 01/28/2016  ? Hyperkalemia 01/28/2016  ? Hyponatremia 01/28/2016  ? Urinary retention 01/28/2016  ? Hypertensive urgency 01/15/2016  ? History of nonadherence to medical treatment 09/08/2015  ? Epigastric pain 08/10/2014  ? Ischemic heart disease due to coronary artery obstruction ([place]) 01/07/2013  ? ?PCP:  [name], FNP ?Pharmacy:   ?[facility] ([facility]), [place] - [place] ?[address] ([place]) [place] [address] ?Phone: [phone] Fax: [phone] ? ?[facility] ([facility] ) - [place], [place] ?[address] ?[place] [address] ?Phone: [phone] Fax:  [phone] ? ? ? ? ?Social Determinants of Health (SDOH) Interventions ?  ? ?Readmission Risk Interventions ?   ? View : No data to display.  ?  ?  ?  ? ? ? ?

## 2021-09-09 NOTE — Consult Note (Signed)
Pharmacy Antibiotic Note ? ?Tiffany Bailey is a 88 y.o. female admitted on 08/30/2021 with sepsis.  Pharmacy has been consulted for vancomycin dosing ? ?3/23 Received Vancomycin 1gm IV x 1 in ED ? ?Plan: ?Give Vancomycin 1gm IV x1 and check random vancomycin level tomorrow morning with AM labs ?Monitor and dose vancomycin based on daily levels given renal insufficiency. ?Will order MRSA PCR as well ? ?Height: 5' (152.4 cm) ?Weight: 43 kg (94 lb 12.8 oz) ?IBW/kg (Calculated) : 45.5 ? ?Temp (24hrs), Avg:98.5 ?F (36.9 ?C), Min:98.4 ?F (36.9 ?C), Max:98.6 ?F (37 ?C) ? ?Recent Labs  ?Lab 08/31/2021 ?1126 08/22/2021 ?1333 08/19/2021 ?1955 09/07/21 ?0441 09/08/21 ?0355 09/09/21 ?0505 09/09/21 ?1244  ?WBC 25.4*  --   --  30.5* 32.7* 36.1* 36.4*  ?CREATININE 1.80*  --  1.53* 1.58* 1.40* 1.48*  --   ?LATICACIDVEN  --  1.6 1.4  --   --   --   --   ?  ?Estimated Creatinine Clearance: 16.8 mL/min (A) (by C-G formula based on SCr of 1.48 mg/dL (H)).   ? ?Allergies  ?Allergen Reactions  ? Amoxicillin Hives and Nausea Only  ? Gabapentin Other (See Comments)  ?  AMS ?Confusion  ? Mirtazapine Itching  ?  Dry mouth with ODT ?Denies SOB with medication  ? Other   ?  UNKNOWN PAIN MEDICATION - unknown reaction  ? ? ?Antimicrobials this admission: ?3/23 Cefepime >>  ?3/24 Azithromycin >>  ?3/26 Vancomycin >> ? ?Dose adjustments this admission: ? ? ?Microbiology results: ?3/23 BCx: NGTD ?3/24 UCx: NGTD  ?3/26 MRSA PCR: ordered ? ?Thank you for allowing pharmacy to be a part of this patient?s care. ? ?Darrick Penna, PharmD, MS PGPM ?Clinical Pharmacist ?09/09/2021 ?3:50 PM ? ? ?

## 2021-09-09 NOTE — Progress Notes (Signed)
PT had been agreeable to have a feeding tube placed for nutrition and meds. PT has had very poor oral and nutritional intake for past two days at least. Dobbhoff tube had been placed in left nare and was secured with tape. When I turned to place order for confirmation CXR, PT removed small bore feeding tube. PT is awake and alert and would not answer why she removed tube. I had explained the great importance of nutritional intake for wound healing and general wellness. PT would not talk with me. MD was notified and CXR was cancelled. PT makes no effort to eat or to take protein shake. Stated yesterday (09/08/21) "I just want to be left alone". ? ? ? ?

## 2021-09-09 NOTE — Progress Notes (Signed)
?PROGRESS NOTE ? ? ? ?Tiffany Bailey  ERX:[id number] DOB: 10/06/1927 DOA: 09/09/2021 ?PCP: [name], FNP  ? ? ? ?Assessment & Plan: ?  ?Principal Problem: ?  Sepsis ([place]) ?Active Problems: ?  DM (diabetes mellitus) ([place]) ?  Essential hypertension ?  CAD (coronary artery disease), native coronary artery ?  GERD (gastroesophageal reflux disease) ?  PAD (peripheral artery disease) ([place]) ?  Hx of AKA (above knee amputation), left ([place]) ?  Anxiety and depression ?  Acute kidney injury superimposed on chronic kidney disease ([place]) ?  Metastatic breast cancer ([place]) ?  Hb-SS disease without crisis ([place]) ?  Hyperkalemia ?  Goals of care, counseling/discussion ?  Pleural effusion ?  UTI (urinary tract infection) ?  Physical deconditioning ?  Transaminitis ?  Failure to thrive in adult ?  Dehydration ?  Elevated liver enzymes ?  Protein-calorie malnutrition, severe ? ? ?Sepsis: met criteria w/ leukocytosis, tachypnea & likely UTI. Continue on IV abxs. Blood cxs no growth. Urine cx shows no growth. Procal 0.36. ? ?Transaminitis: etiology unclear, possibly shock liver. Hepatitis panel is neg. [place] of RUQ was normal. AST & ALT are labile  ? ?Hyperkalemia: resolved  ?  ?AKI on CKD: baseline Cr/GFR is unknown, currently stage IIIb. Likely secondary to rhabdomyolysis. Cr is labile  ? ?Possible pneumonia: repeat CXR shows worse patchy opacities on the mid right perihilar area down. Continue on IV cefepime, azithromycin, bronchodilators & encourage incentive spirometry. Legionella is pending. Strep is neg  ? ?Rhabdomyolysis:  significant. Etiology unclear. Still elevated but trending down daily. Continue on IVFs ?  ?UTI: urine cx shows no growth. UA was positive on admission and abxs started before cx was taken. Continue on IV cefepime  ? ?Leukocytosis: significant. Continues to trend up but no fevers. Repeat procal ordered & differential ordered.  ? ?Thrombocytosis: etiology unclear. Will continue to monitor  ? ?DM2: poorly  controlled, HbA1c 8.5. Continue on SSI w/ accuchecks.  ? ?Metastatic breast cancer:  management per onco as an outpatient  ?  ?Pleural effusion: small as per XR. Not requiring supplemental oxygen ? ?Failure to thrive in adult: w/ poor po intake as well. NG tube ordered & will start tube feeds. Nutrition consulted. Palliative care consulted  ? ?Unlikely Hb-SS disease: no hx of sickle cell anemia as per pt's son, [name] ? ?Severe protein calorie malnutrition: pt is refusing nutritional supplements. Will place in NG tube and start tube feeds  ? ? ?DVT prophylaxis: heparin ?Code Status: DNR ?Family Communication: discussed pt's care w/ pt's son, [name], and answered his questions ?Disposition Plan:  unclear ? ?Level of care: Progressive ? ?Status is: Inpatient ?Remains inpatient appropriate because: severity of illness, see all above  ? ? ? ?Consultants:  ?Palliative care ? ?Procedures:  ? ?Antimicrobials: cefepime, azithromycin  ? ? ?Subjective: ?Pt c/o malaise ? ?Objective: ?Vitals:  ? 09/08/21 2000 09/08/21 2300 09/09/21 0300 09/09/21 0742  ?BP:  (!) 174/70 (!) 156/62 (!) 160/70  ?Pulse:  (!) 116 (!) 114 (!) 110  ?Resp: (!) 31  (!) 22 20  ?Temp:  98.6 ?F (37 ?C) 98.4 ?F (36.9 ?C) 98.5 ?F (36.9 ?C)  ?TempSrc:  Axillary Axillary Axillary  ?SpO2: 98% 99% 98% 99%  ?Weight:      ?Height:      ? ? ?Intake/Output Summary (Last 24 hours) at 09/09/2021 0751 ?Last data filed at 09/09/2021 0700 ?Gross per 24 hour  ?Intake 2396.38 ml  ?Output 3325 ml  ?Net -928.62 ml  ? ?  Filed Weights  ? 09/10/2021 1115  ?Weight: 43 kg  ? ? ?Examination: ? ?General exam: Appears calm & comfortable. Frail appearing  ?Respiratory system: decreased breath sounds b/l ?Cardiovascular system: S1 & S2+. No rubs or clicks ?Gastrointestinal system: Abd is soft, NT, ND & hypoactive bowel sounds ?Central nervous system: alert and awake. Moves all extremities  ?Psychiatry:judgement and insight appears at baseline. Flat mood and affect ? ? ? ?Data Reviewed: I have  personally reviewed following labs and imaging studies ? ?CBC: ?Recent Labs  ?Lab 09/04/2021 ?1126 09/07/21 ?0441 09/08/21 ?0355 09/09/21 ?0505  ?WBC 25.4* 30.5* 32.7* 36.1*  ?HGB 9.0* 9.6* 8.3* 8.2*  ?HCT 28.3* 29.6* 25.8* 24.8*  ?MCV 99.3 95.5 97.0 95.0  ?PLT 418* 501* 430* 437*  ? ?Basic Metabolic Panel: ?Recent Labs  ?Lab 08/28/2021 ?1126 08/28/2021 ?1955 09/07/21 ?0441 09/08/21 ?0355 09/08/21 ?1438 09/09/21 ?0505  ?NA 137 136 138 138  --  142  ?K 5.5* 5.3* 5.3* 5.5* 5.3* 4.7  ?CL 110 110 109 110  --  110  ?CO2 17* 15* 17* 17*  --  20*  ?GLUCOSE 164* 163* 145* 208*  --  267*  ?BUN 55* 54* 54* 57*  --  63*  ?CREATININE 1.80* 1.53* 1.58* 1.40*  --  1.48*  ?CALCIUM 9.0 9.1 9.2 9.1  --  9.2  ?PHOS  --  4.5  --   --   --   --   ? ?GFR: ?Estimated Creatinine Clearance: 16.8 mL/min (A) (by C-G formula based on SCr of 1.48 mg/dL (H)). ?Liver Function Tests: ?Recent Labs  ?Lab 08/20/2021 ?1126 08/30/2021 ?1955 09/07/21 ?0441 09/08/21 ?0355 09/09/21 ?0505  ?AST 759* 889* 902* 672* 591*  ?ALT 477* 572* 589* 590* 650*  ?ALKPHOS 61 80 76 87 86  ?BILITOT 0.7 0.8 0.7 0.9 1.0  ?PROT 5.8* 6.4* 6.1* 5.6* 5.7*  ?ALBUMIN 2.8* 3.1* 3.1* 2.9* 2.7*  ? ?No results for input(s): LIPASE, AMYLASE in the last 168 hours. ?No results for input(s): AMMONIA in the last 168 hours. ?Coagulation Profile: ?Recent Labs  ?Lab 09/07/21 ?0441  ?INR 1.1  ? ?Cardiac Enzymes: ?Recent Labs  ?Lab 09/08/2021 ?1953 09/08/21 ?7209 09/09/21 ?0505  ?CKTOTAL 28,709* 25,169* 14,476*  ? ?BNP (last 3 results) ?No results for input(s): PROBNP in the last 8760 hours. ?HbA1C: ?No results for input(s): HGBA1C in the last 72 hours. ?CBG: ?Recent Labs  ?Lab 09/02/21 ?0825 08/20/2021 ?1857 09/07/2021 ?2042 09/08/21 ?1151 09/08/21 ?1902  ?GLUCAP 153* 156* 161* 180* 243*  ? ?Lipid Profile: ?No results for input(s): CHOL, HDL, LDLCALC, TRIG, CHOLHDL, LDLDIRECT in the last 72 hours. ?Thyroid Function Tests: ?No results for input(s): TSH, T4TOTAL, FREET4, T3FREE, THYROIDAB in the last 72  hours. ?Anemia Panel: ?Recent Labs  ?  09/08/2021 ?1126 09/07/2021 ?1955  ?FERRITIN  --  396*  ?TIBC  --  202*  ?IRON  --  51  ?RETICCTPCT 2.2  --   ? ?Sepsis Labs: ?Recent Labs  ?Lab 09/09/2021 ?1333 08/29/2021 ?1955 09/07/21 ?0441  ?PROCALCITON  --   --  0.36  ?LATICACIDVEN 1.6 1.4  --   ? ? ?Recent Results (from the past 240 hour(s))  ?Culture, blood (routine x 2)     Status: None (Preliminary result)  ? Collection Time: 08/28/2021  1:33 PM  ? Specimen: BLOOD  ?Result Value Ref Range Status  ? Specimen Description BLOOD RIGHT FATTY CASTS  Final  ? Special Requests   Final  ?  BOTTLES DRAWN AEROBIC AND ANAEROBIC Blood Culture adequate volume  ?  Culture   Final  ?  NO GROWTH 3 DAYS ?Performed at [facility], [address], [place], [place] [address] ?  ? Report Status PENDING  Incomplete  ?Resp Panel by RT-PCR (Flu A&B, Covid) Nasopharyngeal Swab     Status: None  ? Collection Time: 08/24/2021  3:17 PM  ? Specimen: Nasopharyngeal Swab; Nasopharyngeal(NP) swabs in vial transport medium  ?Result Value Ref Range Status  ? SARS Coronavirus 2 by RT PCR NEGATIVE NEGATIVE Final  ?  Comment: (NOTE) ?SARS-CoV-2 target nucleic acids are NOT DETECTED. ? ?The SARS-CoV-2 RNA is generally detectable in upper respiratory ?specimens during the acute phase of infection. The lowest ?concentration of SARS-CoV-2 viral copies this assay can detect is ?138 copies/mL. A negative result does not preclude SARS-Cov-2 ?infection and should not be used as the sole basis for treatment or ?other patient management decisions. A negative result may occur with  ?improper specimen collection/handling, submission of specimen other ?than nasopharyngeal swab, presence of viral mutation(s) within the ?areas targeted by this assay, and inadequate number of viral ?copies(<138 copies/mL). A negative result must be combined with ?clinical observations, patient history, and epidemiological ?information. The expected result is Negative. ? ?Fact Sheet for  Patients:  ?EntrepreneurPulse.com.au ? ?Fact Sheet for Healthcare Providers:  ?IncredibleEmployment.be ? ?This test is no t yet approved or cleared by the [place] and

## 2021-09-09 NOTE — Progress Notes (Signed)
Pt removed peripheral IV. Will replace line and monitor. ?

## 2021-09-09 NOTE — Progress Notes (Signed)
Brief Nutrition Note ? ?Consult received for enteral/tube feeding initiation and management. ? ?RD contacted via MD to start TF as pt with poor po, refusing po. Plans for RN to place NGT at bedside.  ? ?Adult Enteral Nutrition Protocol initiated. Follow up assessment to follow. ? ?Admitting Dx: Dehydration [E86.0] ?Weakness [R53.1] ?Elevated liver enzymes [R74.8] ?Sepsis ([place]) [A41.9] ? ?Labs: ? ?Recent Labs  ?Lab 08/19/2021 ?1955 09/07/21 ?0441 09/08/21 ?0355 09/08/21 ?1438 09/09/21 ?0505  ?NA 136 138 138  --  142  ?K 5.3* 5.3* 5.5* 5.3* 4.7  ?CL 110 109 110  --  110  ?CO2 15* 17* 17*  --  20*  ?BUN 54* 54* 57*  --  63*  ?CREATININE 1.53* 1.58* 1.40*  --  1.48*  ?CALCIUM 9.1 9.2 9.1  --  9.2  ?PHOS 4.5  --   --   --   --   ?GLUCOSE 163* 145* 208*  --  267*  ? ? ?Tiffany Parker, MS, RD, LDN ?RD pager number/after hours weekend pager number on Amion. ?  ?

## 2021-09-10 ENCOUNTER — Inpatient Hospital Stay: Payer: Medicare Other

## 2021-09-10 ENCOUNTER — Ambulatory Visit: Payer: Self-pay | Admitting: Urology

## 2021-09-10 ENCOUNTER — Ambulatory Visit: Payer: Self-pay

## 2021-09-10 DIAGNOSIS — Z515 Encounter for palliative care: Secondary | ICD-10-CM | POA: Diagnosis not present

## 2021-09-10 DIAGNOSIS — R748 Abnormal levels of other serum enzymes: Secondary | ICD-10-CM

## 2021-09-10 DIAGNOSIS — A419 Sepsis, unspecified organism: Secondary | ICD-10-CM | POA: Diagnosis not present

## 2021-09-10 DIAGNOSIS — N189 Chronic kidney disease, unspecified: Secondary | ICD-10-CM

## 2021-09-10 DIAGNOSIS — N179 Acute kidney failure, unspecified: Secondary | ICD-10-CM

## 2021-09-10 DIAGNOSIS — M6282 Rhabdomyolysis: Secondary | ICD-10-CM

## 2021-09-10 DIAGNOSIS — D72828 Other elevated white blood cell count: Secondary | ICD-10-CM | POA: Diagnosis not present

## 2021-09-10 DIAGNOSIS — R6520 Severe sepsis without septic shock: Secondary | ICD-10-CM | POA: Diagnosis not present

## 2021-09-10 DIAGNOSIS — K7200 Acute and subacute hepatic failure without coma: Secondary | ICD-10-CM | POA: Diagnosis not present

## 2021-09-10 DIAGNOSIS — R627 Adult failure to thrive: Secondary | ICD-10-CM

## 2021-09-10 DIAGNOSIS — E43 Unspecified severe protein-calorie malnutrition: Secondary | ICD-10-CM

## 2021-09-10 DIAGNOSIS — C50919 Malignant neoplasm of unspecified site of unspecified female breast: Secondary | ICD-10-CM

## 2021-09-10 DIAGNOSIS — R531 Weakness: Secondary | ICD-10-CM | POA: Diagnosis not present

## 2021-09-10 DIAGNOSIS — D72829 Elevated white blood cell count, unspecified: Secondary | ICD-10-CM

## 2021-09-10 DIAGNOSIS — R5381 Other malaise: Secondary | ICD-10-CM

## 2021-09-10 DIAGNOSIS — Z7189 Other specified counseling: Secondary | ICD-10-CM

## 2021-09-10 LAB — COMPREHENSIVE METABOLIC PANEL
ALT: 590 U/L — ABNORMAL HIGH (ref 0–44)
AST: 439 U/L — ABNORMAL HIGH (ref 15–41)
Albumin: 2.5 g/dL — ABNORMAL LOW (ref 3.5–5.0)
Alkaline Phosphatase: 85 U/L (ref 38–126)
Anion gap: 11 (ref 5–15)
BUN: 60 mg/dL — ABNORMAL HIGH (ref 8–23)
CO2: 20 mmol/L — ABNORMAL LOW (ref 22–32)
Calcium: 9 mg/dL (ref 8.9–10.3)
Chloride: 112 mmol/L — ABNORMAL HIGH (ref 98–111)
Creatinine, Ser: 1.34 mg/dL — ABNORMAL HIGH (ref 0.44–1.00)
GFR, Estimated: 37 mL/min — ABNORMAL LOW (ref >60)
Glucose, Bld: 260 mg/dL — ABNORMAL HIGH (ref 70–99)
Potassium: 4.6 mmol/L (ref 3.5–5.1)
Sodium: 143 mmol/L (ref 135–145)
Total Bilirubin: 1 mg/dL (ref 0.3–1.2)
Total Protein: 5.3 g/dL — ABNORMAL LOW (ref 6.5–8.1)

## 2021-09-10 LAB — VANCOMYCIN, RANDOM: Vancomycin Rm: 21

## 2021-09-10 LAB — CBC
HCT: 25.1 % — ABNORMAL LOW (ref 36.0–46.0)
Hemoglobin: 7.9 g/dL — ABNORMAL LOW (ref 12.0–15.0)
MCH: 31.3 pg (ref 26.0–34.0)
MCHC: 31.5 g/dL (ref 30.0–36.0)
MCV: 99.6 fL (ref 80.0–100.0)
Platelets: 375 10*3/uL (ref 150–400)
RBC: 2.52 MIL/uL — ABNORMAL LOW (ref 3.87–5.11)
RDW: 15.5 % (ref 11.5–15.5)
WBC: 31.9 10*3/uL — ABNORMAL HIGH (ref 4.0–10.5)
nRBC: 0 % (ref 0.0–0.2)

## 2021-09-10 LAB — GLUCOSE, CAPILLARY
Glucose-Capillary: 234 mg/dL — ABNORMAL HIGH (ref 70–99)
Glucose-Capillary: 228 mg/dL — ABNORMAL HIGH (ref 70–99)
Glucose-Capillary: 197 mg/dL — ABNORMAL HIGH (ref 70–99)
Glucose-Capillary: 185 mg/dL — ABNORMAL HIGH (ref 70–99)

## 2021-09-10 LAB — CK: Total CK: 7717 U/L — ABNORMAL HIGH (ref 38–234)

## 2021-09-10 MED ORDER — DRONABINOL 2.5 MG PO CAPS: 2.5000 mg | ORAL_CAPSULE | Freq: Every day | ORAL | Status: DC

## 2021-09-10 MED ORDER — VANCOMYCIN VARIABLE DOSE PER UNSTABLE RENAL FUNCTION (PHARMACIST DOSING): Status: DC

## 2021-09-10 MED ORDER — VANCOMYCIN HCL IN DEXTROSE 1-5 GM/200ML-% IV SOLN: 1000.0000 mg | INTRAVENOUS | Status: DC

## 2021-09-10 MED ORDER — SODIUM CHLORIDE 0.9 % IV SOLN
500.0000 mg | INTRAVENOUS | Status: AC
  Administered 2021-09-11: 500 mg via INTRAVENOUS
  Filled 2021-09-10: qty 5

## 2021-09-10 MED ORDER — DRONABINOL 2.5 MG PO CAPS
2.5000 mg | ORAL_CAPSULE | Freq: Every day | ORAL | Status: DC
  Administered 2021-09-10 – 2021-09-14 (×3): 2.5 mg via ORAL
  Filled 2021-09-10 (×8): qty 1

## 2021-09-10 MED ORDER — ADULT MULTIVITAMIN W/MINERALS CH
1.0000 | ORAL_TABLET | Freq: Every day | ORAL | Status: DC
  Administered 2021-09-11 – 2021-09-12 (×2): 1 via ORAL
  Filled 2021-09-10 (×4): qty 1

## 2021-09-10 MED ORDER — PROSOURCE PLUS PO LIQD
30.0000 mL | Freq: Three times a day (TID) | ORAL | Status: DC
  Administered 2021-09-12: 30 mL via ORAL
  Filled 2021-09-10 (×7): qty 30

## 2021-09-10 NOTE — Plan of Care (Signed)
?  Problem: Education: ?Goal: Knowledge of General Education information will improve ?Description: Including pain rating scale, medication(s)/side effects and non-pharmacologic comfort measures ?Outcome: Not Progressing ?  ?Problem: Health Behavior/Discharge Planning: ?Goal: Ability to manage health-related needs will improve ?Outcome: Not Progressing ?  ?Problem: Clinical Measurements: ?Goal: Ability to maintain clinical measurements within normal limits will improve ?Outcome: Not Progressing ?Goal: Will remain free from infection ?Outcome: Not Progressing ?Goal: Diagnostic test results will improve ?Outcome: Not Progressing ?Goal: Respiratory complications will improve ?Outcome: Not Progressing ?Goal: Cardiovascular complication will be avoided ?Outcome: Not Progressing ?  ?Problem: Activity: ?Goal: Risk for activity intolerance will decrease ?Outcome: Not Progressing ?  ?Problem: Nutrition: ?Goal: Adequate nutrition will be maintained ?Outcome: Not Progressing ?  ?Problem: Coping: ?Goal: Level of anxiety will decrease ?Outcome: Not Progressing ?  ?Problem: Elimination: ?Goal: Will not experience complications related to bowel motility ?Outcome: Not Progressing ?Goal: Will not experience complications related to urinary retention ?Outcome: Not Progressing ?  ?Problem: Pain Managment: ?Goal: General experience of comfort will improve ?Outcome: Not Progressing ?  ?Problem: Safety: ?Goal: Ability to remain free from injury will improve ?Outcome: Not Progressing ?  ?Problem: Skin Integrity: ?Goal: Risk for impaired skin integrity will decrease ?Outcome: Not Progressing ?Patient total care refusing feeding medications and care ?  ?

## 2021-09-10 NOTE — Progress Notes (Addendum)
Nutrition Follow-up ? ?DOCUMENTATION CODES:  ? ?Severe malnutrition in context of chronic illness ? ?INTERVENTION:  ? ?-D/c TF secondary to no access ?-Continue Ensure Enlive po BID, each supplement provides 350 kcal and 20 grams of protein ?-30 ml Prosource Plus TID, each supplement provides 100 kcals and 15 grams protein ?-MVI with minerals daily ?-Hormel Shake TID with meals, each supplement provides 520 kcals and 22 grams protein ? ?NUTRITION DIAGNOSIS:  ? ?Severe Malnutrition related to chronic illness (UTI, CHF, FTT, metastatic breast CA) as evidenced by energy intake < or equal to 75% for > or equal to 1 month, severe muscle depletion, moderate fat depletion. ? ?Ongoing ? ?GOAL:  ? ?Patient will meet greater than or equal to 90% of their needs ? ?Progressing  ? ?MONITOR:  ? ?PO intake, Supplement acceptance, Diet advancement, Labs, Weight trends ? ?REASON FOR ASSESSMENT:  ? ?Malnutrition Screening Tool ?  ? ?ASSESSMENT:  ? ?Pt admitted with sepsis. PMH significant for diabetes, CAD, HTN, recent hospitalization for UTI and d/c 4 days ago, CHF, failure to thrive, metastatic breast cancer. ? ?3/26- pt pulled out NGT after placement ? ?Reviewed I/O's: +100 ml x 24 hours and +866 ml since admission ? ?UOP: 1.4 L x 24 hours  ? ?Case discussed with RN and MD. Pt has pulled out NGT x 3. Per MD, she does not think pt wants NGT. Pt with minimal intake and has little desire to take supplements. Noted meal completion 0%.  ? ?Palliative care following for goals of care discussions. Family is learning towards comfort care, but discussing options amongst family members.  ? ?Medications reviewed and include lactated ringers infusion @ 75 ml/hr and marinol.  ? ?Labs reviewed: CBGS: 228-234 (inpatient orders for glycemic control are none).   ? ?Diet Order:   ?Diet Order   ? ?       ?  Diet regular Room service appropriate? Yes; Fluid consistency: Thin  Diet effective now       ?  ? ?  ?  ? ?  ? ? ?EDUCATION NEEDS:   ? ?Education needs have been addressed ? ?Skin:  Skin Assessment: Skin Integrity Issues: ?Skin Integrity Issues:: Stage II ?Stage II: buttocks and sacrum ? ?Last BM:  09/07/21 ? ?Height:  ? ?Ht Readings from Last 1 Encounters:  ?08/17/2021 5' (1.524 m)  ? ? ?Weight:  ? ?Wt Readings from Last 1 Encounters:  ?09/10/21 43.1 kg  ? ?BMI:  Body mass index is 18.56 kg/m?. ? ?Estimated Nutritional Needs:  ? ?Kcal:  1300-1500 ? ?Protein:  65-80g ? ?Fluid:  >/=1.5L ? ? ? ?Loistine Chance, RD, LDN, CDCES ?Registered Dietitian II ?Certified Diabetes Care and Education Specialist ?Please refer to [facility] for RD and/or RD on-call/weekend/after hours pager  ?

## 2021-09-10 NOTE — TOC Progression Note (Signed)
Transition of Care (TOC) - Progression Note  ? ? ?Patient Details  ?Name: Tiffany Bailey ?MRN: [id number] ?Date of Birth: 03-01-1928 ? ?Transition of Care (TOC) CM/SW Contact  ?Candie Chroman, LCSW ?Phone Number: ?09/10/2021, 3:04 PM ? ?Clinical Narrative: Met with family per RN request. Several family members at bedside. Patient awake but with eyes closed. Family requesting advanced directives be completed while in the hospital. Patient said she does not have a preference on who is her HCPOA. Daughter said she has paperwork in her car, it's just not notarized so other family member will go get it. Sent secure chat to RN asking her to consult chaplain. Family also asking for a patient advocate. Explained that we do not have a job title that is specifically "Patient Advocate" but that [facility] team is available as needed to assist with care coordination.   ? ?Expected Discharge Plan: [place] ?Barriers to Discharge: Continued Medical Work up ? ?Expected Discharge Plan and Services ?Expected Discharge Plan: [place] ?  ?  ?Post Acute Care Choice: Home Health ?Living arrangements for the past 2 months: [place] ?                ?  ?  ?  ?  ?  ?  ?  ?  ?  ?  ? ? ?Social Determinants of Health (SDOH) Interventions ?  ? ?Readmission Risk Interventions ?   ? View : No data to display.  ?  ?  ?  ? ? ?

## 2021-09-10 NOTE — Progress Notes (Signed)
?PROGRESS NOTE ? ? ? ?Tiffany Bailey  JEH:[id number] DOB: [birth year] DOA: 08/20/2021 ?PCP: [name], FNP  ? ? ? ?Assessment & Plan: ?  ?Principal Problem: ?  Sepsis ([place]) ?Active Problems: ?  DM (diabetes mellitus) ([place]) ?  Essential hypertension ?  CAD (coronary artery disease), native coronary artery ?  GERD (gastroesophageal reflux disease) ?  PAD (peripheral artery disease) ([place]) ?  Hx of AKA (above knee amputation), left ([place]) ?  Anxiety and depression ?  Acute kidney injury superimposed on chronic kidney disease ([place]) ?  Metastatic breast cancer ([place]) ?  Hb-SS disease without crisis ([place]) ?  Hyperkalemia ?  Goals of care, counseling/discussion ?  Pleural effusion ?  UTI (urinary tract infection) ?  Physical deconditioning ?  Transaminitis ?  Failure to thrive in adult ?  Dehydration ?  Elevated liver enzymes ?  Protein-calorie malnutrition, severe ? ? ?Sepsis: met criteria w/ leukocytosis, tachypnea & likely UTI. Continue on IV abxs. Blood cxs no growth. Urine cx shows no growth. Repeat procal 0.85 ? ?Transaminitis: etiology unclear, possibly shock liver. Hepatitis panel is neg. [place] of RUQ was normal. AST & ALT are trending down today   ? ?Hyperkalemia: resolved  ?  ?AKI on CKD: baseline Cr/GFR is unknown, currently stage IIIb. Likely secondary to rhabdomyolysis. Cr is trending down slightly today  ? ?Possible post obstructive pneumonia: as per ID.  D/c IV cefepime & vanco as per ID. Continue on bronchodilators & encourage incentive spirometry. Strep is neg. Legionella is pending  ? ?Rhabdomyolysis:  significant. Etiology unclear. Still elevated but continues to trend down. Continue on IVFs  ?  ?UTI: urine cx shows no growth. UA was positive on admission and abxs started before cx was taken.  ? ?Leukocytosis: significant. Etiology unclear, infection vs blood dyscrasia vs medication ADRs. Oncology was consulted. ID consulted  ? ?Thrombocytosis: resolved  ? ?DM2: HbA1c 8.5, poorly controlled. Continue on  SSI w/ accuchecks  ? ?Metastatic breast cancer:  oncology consulted as per pt's family request  ?  ?Pleural effusion: small as per XR. Not requiring supplemental oxyge  ? ?Failure to thrive in adult: w/ poor po intake as well. NG placed x 3 and pulled out 3 times by pt. Pt stated this morning that she did not want a feeding tube but pt's son, [name], has a different opinion.Palliative following and recs apprec   ? ?Severe protein calorie malnutrition: pt is refusing nutritional supplements. NG tube placed x 3 and pulled out 3 times by pt. Pt stated this that she did not want a feeding tube  ? ? ?DVT prophylaxis: heparin ?Code Status: DNR ?Family Communication: discussed pt's care w/ pt's son, [name], & granddaughter at bedside and answered their questions ?Disposition Plan:  unclear ? ?Level of care: Progressive ? ?Status is: Inpatient ?Remains inpatient appropriate because: severity of illness, see all above  ? ? ? ?Consultants:  ?Palliative care ? ?Procedures:  ? ?Antimicrobials:  ? ? ?Subjective: ?Pt c/o fatigue  ? ?Objective: ?Vitals:  ? 09/09/21 2029 09/09/21 2342 09/10/21 0500 09/10/21 0727  ?BP: (!) 157/64 (!) 163/70  (!) 175/68  ?Pulse: (!) 108 (!) 111  (!) 115  ?Resp: (!) 22 (!) 21  20  ?Temp:  98.8 ?F (37.1 ?C)  98.2 ?F (36.8 ?C)  ?TempSrc:    Axillary  ?SpO2: 99% 99%  99%  ?Weight:   43.1 kg   ?Height:      ? ? ?Intake/Output Summary (Last 24 hours) at 09/10/2021  5102 ?Last data filed at 09/10/2021 0500 ?Gross per 24 hour  ?Intake 1499.5 ml  ?Output 1400 ml  ?Net 99.5 ml  ? ?Filed Weights  ? 08/24/2021 1115 09/10/21 0500  ?Weight: 43 kg 43.1 kg  ? ? ?Examination: ? ?General exam: Appears calm & comfortable. Frail appearing  ?Respiratory system: diminished breath sounds b/l  ?Cardiovascular system: S1 & S2+. No rubs or gallops ?Gastrointestinal system: Abd is soft, NT, ND & hypoactive bowel sounds ?Central nervous system: Alert and oriented. Moves all extremities  ?Psychiatry: judgement and insight appears at  baseline. Flat mood and affect ? ? ? ?Data Reviewed: I have personally reviewed following labs and imaging studies ? ?CBC: ?Recent Labs  ?Lab 09/07/21 ?0441 09/08/21 ?0355 09/09/21 ?0505 09/09/21 ?1244 09/10/21 ?0351  ?WBC 30.5* 32.7* 36.1* 36.4* 31.9*  ?NEUTROABS  --   --   --  31.9*  --   ?HGB 9.6* 8.3* 8.2* 8.3* 7.9*  ?HCT 29.6* 25.8* 24.8* 25.7* 25.1*  ?MCV 95.5 97.0 95.0 98.8 99.6  ?PLT 501* 430* 437* 433* 375  ? ?Basic Metabolic Panel: ?Recent Labs  ?Lab 08/30/2021 ?1955 09/07/21 ?0441 09/08/21 ?0355 09/08/21 ?1438 09/09/21 ?0505 09/10/21 ?0351  ?NA 136 138 138  --  142 143  ?K 5.3* 5.3* 5.5* 5.3* 4.7 4.6  ?CL 110 109 110  --  110 112*  ?CO2 15* 17* 17*  --  20* 20*  ?GLUCOSE 163* 145* 208*  --  267* 260*  ?BUN 54* 54* 57*  --  63* 60*  ?CREATININE 1.53* 1.58* 1.40*  --  1.48* 1.34*  ?CALCIUM 9.1 9.2 9.1  --  9.2 9.0  ?PHOS 4.5  --   --   --   --   --   ? ?GFR: ?Estimated Creatinine Clearance: 18.6 mL/min (A) (by C-G formula based on SCr of 1.34 mg/dL (H)). ?Liver Function Tests: ?Recent Labs  ?Lab 08/17/2021 ?1955 09/07/21 ?0441 09/08/21 ?0355 09/09/21 ?0505 09/10/21 ?0351  ?AST 889* 902* 672* 591* 439*  ?ALT 572* 589* 590* 650* 590*  ?ALKPHOS 80 76 87 86 85  ?BILITOT 0.8 0.7 0.9 1.0 1.0  ?PROT 6.4* 6.1* 5.6* 5.7* 5.3*  ?ALBUMIN 3.1* 3.1* 2.9* 2.7* 2.5*  ? ?No results for input(s): LIPASE, AMYLASE in the last 168 hours. ?No results for input(s): AMMONIA in the last 168 hours. ?Coagulation Profile: ?Recent Labs  ?Lab 09/07/21 ?0441  ?INR 1.1  ? ?Cardiac Enzymes: ?Recent Labs  ?Lab 09/03/2021 ?1953 09/08/21 ?5852 09/09/21 ?0505 09/10/21 ?0351  ?CKTOTAL 28,709* 25,169* 14,476* 7,717*  ? ?BNP (last 3 results) ?No results for input(s): PROBNP in the last 8760 hours. ?HbA1C: ?No results for input(s): HGBA1C in the last 72 hours. ?CBG: ?Recent Labs  ?Lab 09/10/2021 ?1857 08/18/2021 ?2042 09/08/21 ?1151 09/08/21 ?1902  ?GLUCAP 156* 161* 180* 243*  ? ?Lipid Profile: ?No results for input(s): CHOL, HDL, LDLCALC, TRIG, CHOLHDL,  LDLDIRECT in the last 72 hours. ?Thyroid Function Tests: ?No results for input(s): TSH, T4TOTAL, FREET4, T3FREE, THYROIDAB in the last 72 hours. ?Anemia Panel: ?No results for input(s): VITAMINB12, FOLATE, FERRITIN, TIBC, IRON, RETICCTPCT in the last 72 hours. ? ?Sepsis Labs: ?Recent Labs  ?Lab 08/21/2021 ?1333 08/19/2021 ?1955 09/07/21 ?0441 09/09/21 ?1246  ?PROCALCITON  --   --  0.36 0.85  ?LATICACIDVEN 1.6 1.4  --   --   ? ? ?Recent Results (from the past 240 hour(s))  ?Culture, blood (routine x 2)     Status: None (Preliminary result)  ? Collection Time: 08/22/2021  1:33 PM  ?  Specimen: BLOOD  ?Result Value Ref Range Status  ? Specimen Description BLOOD RIGHT FATTY CASTS  Final  ? Special Requests   Final  ?  BOTTLES DRAWN AEROBIC AND ANAEROBIC Blood Culture adequate volume  ? Culture   Final  ?  NO GROWTH 4 DAYS ?Performed at [facility], [address], [place], [place] [address] ?  ? Report Status PENDING  Incomplete  ?Resp Panel by RT-PCR (Flu A&B, Covid) Nasopharyngeal Swab     Status: None  ? Collection Time: 08/25/2021  3:17 PM  ? Specimen: Nasopharyngeal Swab; Nasopharyngeal(NP) swabs in vial transport medium  ?Result Value Ref Range Status  ? SARS Coronavirus 2 by RT PCR NEGATIVE NEGATIVE Final  ?  Comment: (NOTE) ?SARS-CoV-2 target nucleic acids are NOT DETECTED. ? ?The SARS-CoV-2 RNA is generally detectable in upper respiratory ?specimens during the acute phase of infection. The lowest ?concentration of SARS-CoV-2 viral copies this assay can detect is ?138 copies/mL. A negative result does not preclude SARS-Cov-2 ?infection and should not be used as the sole basis for treatment or ?other patient management decisions. A negative result may occur with  ?improper specimen collection/handling, submission of specimen other ?than nasopharyngeal swab, presence of viral mutation(s) within the ?areas targeted by this assay, and inadequate number of viral ?copies(<138 copies/mL). A negative result must be  combined with ?clinical observations, patient history, and epidemiological ?information. The expected result is Negative. ? ?Fact Sheet for Patients:  ?EntrepreneurPulse.com.au ? ?Fact Sheet

## 2021-09-10 NOTE — Consult Note (Signed)
? ?  ?Palliative Medicine ?[facility] ?[phone] [phone] ? ? ?Name: Tiffany Bailey ?Date: 09/10/2021 ?MRN: [id number]  ?DOB: 07-12-1927 ? ?Patient Care Team: ?[name], FNP as PCP - General (Family Medicine) ?Jason Coop, NP as Nurse Practitioner [facility] and Palliative Medicine) ?[name], MD as Consulting Physician (Oncology) ?Minna Merritts, MD as Consulting Physician (Cardiology) ?Lauretta Grill, NP as Nurse Practitioner (Nurse Practitioner) ?Oberon Hehir, [name], NP as Nurse Practitioner [facility] and Palliative Medicine)  ? ? ?REASON FOR CONSULTATION: ?Tiffany Bailey is a 88 y.o. female with multiple medical problems including COPD, PVD status post history of left lower extremity amputation, CKD, CAD, sickle cell disease, diabetes, hypertension, and stage IV breast cancer. Patient was rotated from Arimidex to fulvestrant plus abemaciclib but has had poor tolerance of treatment.  CT on 07/06/2021 showed interval decrease in the size of pretracheal/subcarinal lymph node, consistent with treatment response. However, patient poorly tolerated abemaciclib and so this was discontinued and she has been on monthly fulvestrant since 03/16/21. Patient was hospitalized 08/31/21-09/02/21 with progressive weakness and found to have UTI. She was readmitted on 08/19/2021 for same. Patient was referred to palliative care to address goals..  ? ?SOCIAL HISTORY:    ? reports that she has never smoked. She has never used smokeless tobacco. She reports that she does not drink alcohol and does not use drugs. ? ?Patient is widowed.  She lives at home with her son.  She has 4 sons and 3 daughters.  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 ? ?ADVANCE DIRECTIVES:  ?None ? ?CODE STATUS: DNR ? ?PAST MEDICAL HISTORY: ?Past Medical History:  ?Diagnosis Date  ? Anemia   ? Anginal pain ([place])   ? Arthritis   ? Breast cancer ([place])  07/19/2019  ? CKD (chronic kidney disease), stage III ([place])   ? Coronary artery disease   ? a. 2002 CABG x 3 (LIMA-LAD, SVG-D1, SVG-RCA); b. s/p multiple PCI's.  ? Diabetes mellitus without complication ([place])   ? Diastolic dysfunction   ? a. 12/2017 Echo: EF 55-60%, no rwma, Gr1 DD. Triv AI. Mild-mod MR/TR. PASP 59mHg.  ? Edema   ? Failure to thrive in adult   ? Fecal impaction ([place] 08/23/2021  ? GERD (gastroesophageal reflux disease)   ? Hb-SS disease without crisis ([place] 10/23/2017  ? History of breast cancer   ? HOH (hard of hearing)   ? Hyperlipidemia   ? Hypertension   ? Myocardial infarction ([facility]   ? PAD (peripheral artery disease) ([place]   ? a. s/p L AKA; b. 12/2018 PTA R peroneal & PTA/Stenting R SFA.  ? Palpitations   ? ? ?PAST SURGICAL HISTORY:  ?Past Surgical History:  ?Procedure Laterality Date  ? ABDOMINAL HYSTERECTOMY    ? ABOVE KNEE LEG AMPUTATION Left 2013  ? ANTERIOR VITRECTOMY Left 11/16/2015  ? Procedure: ANTERIOR VITRECTOMY;  Surgeon: BEulogio Bear MD;  Location: [facility] ORS;  Service: Ophthalmology;  Laterality: Left;  ? BLADDER SURGERY    ? CATARACT EXTRACTION W/PHACO Left 11/16/2015  ? Procedure: CATARACT EXTRACTION PHACO AND INTRAOCULAR LENS PLACEMENT (IOC);  Surgeon: BEulogio Bear MD;  Location: [facility] ORS;  Service: Ophthalmology;  Laterality: Left;  Lot # [id number] ?[place] 01:22.8 ?AP%:12.6 ?CDE: 10.46  ? CATARACT EXTRACTION W/PHACO Right 08/22/2016  ? Procedure: CATARACT EXTRACTION PHACO AND INTRAOCULAR LENS PLACEMENT (IOC);  Surgeon: BEulogio Bear MD;  Location: [facility] ORS;  Service: Ophthalmology;  Laterality: Right;  Lot # [id number] ?[place]: 00:52.1 ?AP%:8.7 ?CDE: 4.99 ?  ? CORONARY ANGIOPLASTY    ? STENT  ? CORONARY ARTERY BYPASS GRAFT    ? LOWER EXTREMITY ANGIOGRAPHY Right 01/14/2019  ? Procedure: LOWER EXTREMITY ANGIOGRAPHY;  Surgeon: Algernon Huxley, MD;  Location: [place] [facility];  Service: Cardiovascular;  Laterality: Right;  ? MASTECTOMY    ? TUMOR REMOVAL    ? ABDOMINAL   ? ? ?HEMATOLOGY/ONCOLOGY HISTORY:  ?Oncology History  ?Breast cancer ([place])  ?07/19/2019 Initial Diagnosis  ? Breast cancer [facility]) ?  ?07/26/2019 - 07/26/2019 Chemotherapy  ? The patient had anastrozole (ARIMIDEX) 1 MG tablet, 1 mg, Oral, Daily, 0 of 1 cycle, Start date: --, End date: -- ?abemaciclib (VERZENIO) 150 MG tablet, 150 mg, Oral, 2 times daily, 0 of 1 cycle, Start date: --, End date: -- ? ? for chemotherapy treatment.  ? ?  ? ? ?ALLERGIES:  is allergic to amoxicillin, gabapentin, mirtazapine, and other. ? ?MEDICATIONS:  ?Current Facility-Administered Medications  ?Medication Dose Route Frequency Provider Last Rate Last Admin  ? acetaminophen (TYLENOL) tablet 650 mg  650 mg Oral Q6H PRN Wyvonnia Dusky, MD      ? azithromycin (ZITHROMAX) 500 mg in sodium chloride 0.9 % 250 mL IVPB  500 mg Intravenous Q24H Oswald Hillock, RPH 250 mL/hr at 09/10/21 1155 500 mg at 09/10/21 1155  ? ceFEPIme (MAXIPIME) 2 g in sodium chloride 0.9 % 100 mL IVPB  2 g Intravenous Q24H Rauer, Forde Dandy, RPH   Stopped at 09/09/21 1450  ? Chlorhexidine Gluconate Cloth 2 % PADS 6 each  6 each Topical Daily Wyvonnia Dusky, MD   6 each at 09/09/21 [phone]  ? dronabinol (MARINOL) capsule 2.5 mg  2.5 mg Oral QAC lunch Wyvonnia Dusky, MD      ? feeding supplement (ENSURE ENLIVE / ENSURE PLUS) liquid 237 mL  237 mL Oral BID BM Wyvonnia Dusky, MD   237 mL at 09/10/21 0913  ? heparin injection 5,000 Units  5,000 Units Subcutaneous Q8H Emeterio Reeve, DO   5,000 Units at 09/10/21 3614  ? ipratropium-albuterol (DUONEB) 0.5-2.5 (3) MG/3ML nebulizer solution 3 mL  3 mL Nebulization Q6H PRN Wyvonnia Dusky, MD      ? labetalol (NORMODYNE) injection 10 mg  10 mg Intravenous Q2H PRN Sharion Settler, NP   10 mg at 09/07/21 2357  ? lactated ringers infusion   Intravenous Continuous Wyvonnia Dusky, MD 75 mL/hr at 09/10/21 1000 Infusion Verify at 09/10/21 1000  ? morphine (PF) 2 MG/ML injection 1 mg  1 mg Intravenous Q4H PRN  Wyvonnia Dusky, MD      ? oxyCODONE-acetaminophen (PERCOCET/ROXICET) 5-325 MG per tablet 1 tablet  1 tablet Oral Q6H PRN Wyvonnia Dusky, MD   1 tablet at 09/07/21 1839  ? [START ON 09/11/2021] vancomycin (VANCOCIN) IVPB 1000 mg/200 mL premix  1,000 mg Intravenous Q48H Oswald Hillock, RPH      ? ? ?VITAL SIGNS: ?BP (!) 175/68 (BP Location: Right Arm)   Pulse (!) 115   Temp 98.2 ?F (36.8 ?C) (Axillary)   Resp 20   Ht 5' (1.524 m)   Wt 95 lb 0.3 oz (43.1 kg)   SpO2 99%   BMI 18.56 kg/m?  ?Filed Weights  ? 08/20/2021 1115 09/10/21 0500  ?Weight: 94 lb 12.8 oz (43 kg) 95 lb 0.3 oz (43.1 kg)  ?  ?Estimated body mass index is 18.56 kg/m? as calculated from  the following: ?  Height as of this encounter: 5' (1.524 m). ?  Weight as of this encounter: 95 lb 0.3 oz (43.1 kg). ? ?LABS: ?CBC: ?   ?Component Value Date/Time  ? WBC 31.9 (H) 09/10/2021 0351  ? HGB 7.9 (L) 09/10/2021 0351  ? HCT 25.1 (L) 09/10/2021 0351  ? PLT 375 09/10/2021 0351  ? MCV 99.6 09/10/2021 0351  ? NEUTROABS 31.9 (H) 09/09/2021 1244  ? LYMPHSABS 1.1 09/09/2021 1244  ? MONOABS 1.9 (H) 09/09/2021 1244  ? EOSABS 0.0 09/09/2021 1244  ? BASOSABS 0.1 09/09/2021 1244  ? ?Comprehensive Metabolic Panel: ?   ?Component Value Date/Time  ? NA 143 09/10/2021 0351  ? K 4.6 09/10/2021 0351  ? CL 112 (H) 09/10/2021 0351  ? CO2 20 (L) 09/10/2021 0351  ? BUN 60 (H) 09/10/2021 0351  ? CREATININE 1.34 (H) 09/10/2021 0351  ? CREATININE 1.18 (H) 06/19/2018 1006  ? GLUCOSE 260 (H) 09/10/2021 0351  ? CALCIUM 9.0 09/10/2021 0351  ? AST 439 (H) 09/10/2021 0351  ? ALT 590 (H) 09/10/2021 0351  ? ALKPHOS 85 09/10/2021 0351  ? BILITOT 1.0 09/10/2021 0351  ? PROT 5.3 (L) 09/10/2021 0351  ? ALBUMIN 2.5 (L) 09/10/2021 0351  ? ? ?RADIOGRAPHIC STUDIES: ?CT ABDOMEN PELVIS WO CONTRAST ? ?Result Date: 08/23/2021 ?CLINICAL DATA:  Abdominal pain EXAM: CT ABDOMEN AND PELVIS WITHOUT CONTRAST TECHNIQUE: Multidetector CT imaging of the abdomen and pelvis was performed following the  standard protocol without IV contrast. RADIATION DOSE REDUCTION: This exam was performed according to the departmental dose-optimization program which includes automated exposure control, adjustment of the mA and/or kV according to pati

## 2021-09-10 NOTE — Progress Notes (Signed)
Physical Therapy Treatment ?Patient Details ?Name: Tiffany Bailey ?MRN: [id number] ?DOB: May 26, 1928 ?Today's Date: 09/10/2021 ? ? ?History of Present Illness Pt is a 88 y/o F admitted on 09/02/2021 with c/o still not feeling well. Pt admitted for treatment of sepsis. PMH: DM, CAD, HTN, recent hospitalization for UTI, CHF, breast CA, HOH, HLD, MI, PAD, L AKA ? ?  ?PT Comments  ? ? Pt seen for PT tx with daughter in law & granddaughter present for session. Pt resting with eyes closed with PT providing encouragement, verbal stimulation, & lights on in attempts to increase pt's alertness. Pt assisted supine<>sit to encourage increase in alertness but pt continues to keep eyes closed despite participating in holding self upright. Provided pt with warm wet cloth & encouraged her to wipe her face but pt makes no attempts & requires dependent assist from PT. Pt assisted back to bed & repositioned in L sidelying for pressure relief management. Oncology NP arrived to room & family informed him they would like palliative consult.  ?   ?Recommendations for follow up therapy are one component of a multi-disciplinary discharge planning process, led by the attending physician.  Recommendations may be updated based on patient status, additional functional criteria and insurance authorization. ? ?Follow Up Recommendations ? Home health PT ?  ?  ?Assistance Recommended at Discharge Frequent or constant Supervision/Assistance  ?Patient can return home with the following Two people to help with walking and/or transfers;Two people to help with bathing/dressing/bathroom;Direct supervision/assist for medications management;Help with stairs or ramp for entrance ?  ?Equipment Recommendations ? Hospital bed (manual hoyer lift & sling)  ?  ?Recommendations for Other Services   ? ? ?  ?Precautions / Restrictions Precautions ?Precautions: Fall ?Precaution Comments: old L AKA ?Restrictions ?Weight Bearing Restrictions: No  ?  ? ?Mobility ? Bed  Mobility ?Overal bed mobility: Needs Assistance ?Bed Mobility: Supine to Sit, Sit to Supine ?  ?  ?Supine to sit:  (dependent +2) ?Sit to supine:  (dependent +2) ?  ?  ?  ? ?Transfers ?  ?  ?  ?  ?  ?  ?  ?  ?  ?  ?  ? ?Ambulation/Gait ?  ?  ?  ?  ?  ?  ?  ?  ? ? ?Stairs ?  ?  ?  ?  ?  ? ? ?Wheelchair Mobility ?  ? ?Modified Rankin (Stroke Patients Only) ?  ? ? ?  ?Balance Overall balance assessment: Needs assistance ?Sitting-balance support: Feet supported, Bilateral upper extremity supported ?Sitting balance-Leahy Scale: Poor ?Sitting balance - Comments: Pt does participate in static sitting, holding trunk upright but does demonstrate posterior lean ?Postural control: Posterior lean ?  ?  ?  ?  ?  ?  ?  ?  ?  ?  ?  ?  ?  ?  ?  ? ?  ?Cognition Arousal/Alertness: Lethargic ?Behavior During Therapy: Flat affect ?Overall Cognitive Status: Difficult to assess ?  ?  ?  ?  ?  ?  ?  ?  ?  ?  ?  ?  ?  ?  ?  ?  ?General Comments: Pt only opens eyes occasionally but only for ~1 second at a time before closing them again. Pt doesn't engage with PT or family despite encouragement during session. ?  ?  ? ?  ?Exercises   ? ?  ?General Comments   ?  ?  ? ?Pertinent Vitals/Pain Pain Assessment ?Pain Assessment: Faces ?  Faces Pain Scale: No hurt  ? ? ?Home Living   ?  ?  ?  ?  ?  ?  ?  ?  ?  ?   ?  ?Prior Function    ?  ?  ?   ? ?PT Goals (current goals can now be found in the care plan section) Acute Rehab PT Goals ?Patient Stated Goal: none stated ?PT Goal Formulation: With patient/family ?Time For Goal Achievement: 09/21/21 ?Potential to Achieve Goals: Fair ?Progress towards PT goals: PT to reassess next treatment ? ?  ?Frequency ? ? ? Min 2X/week ? ? ? ?  ?PT Plan Current plan remains appropriate  ? ? ?Co-evaluation   ?  ?  ?  ?  ? ?  ?AM-PAC PT "6 Clicks" Mobility   ?Outcome Measure ? Help needed turning from your back to your side while in a flat bed without using bedrails?: Total ?Help needed moving from lying on your back to  sitting on the side of a flat bed without using bedrails?: Total ?Help needed moving to and from a bed to a chair (including a wheelchair)?: Total ?Help needed standing up from a chair using your arms (e.g., wheelchair or bedside chair)?: Total ?Help needed to walk in hospital room?: Total ?Help needed climbing 3-5 steps with a railing? : Total ?6 Click Score: 6 ? ?  ?End of Session   ?Activity Tolerance: Patient limited by lethargy ?Patient left: in bed;with call bell/phone within reach;with bed alarm set;with family/visitor present (NP present in room) ?  ?PT Visit Diagnosis: Muscle weakness (generalized) (M62.81);Other abnormalities of gait and mobility (R26.89) ?  ? ? ?Time: 7026-3785 ?PT Time Calculation (min) (ACUTE ONLY): 9 min ? ?Charges:  $Therapeutic Activity: 8-22 mins          ?          ? ?Lavone Nian, PT, DPT ?09/10/21, 1:17 PM ? ? ? ?Waunita Schooner ?09/10/2021, 1:15 PM ? ?

## 2021-09-10 NOTE — Consult Note (Addendum)
? ?Hematology/Oncology Consult note ?[phone] [phone] ? ?  ? ? ?Patient Care Team: ?[name], FNP as PCP - General (Family Medicine) ?Jason Coop, NP as Nurse Practitioner [facility] and Palliative Medicine) ?[name], MD as Consulting Physician (Oncology) ?Minna Merritts, MD as Consulting Physician (Cardiology) ?Lauretta Grill, NP as Nurse Practitioner (Nurse Practitioner) ?Borders, [name], NP as Nurse Practitioner [facility] and Palliative Medicine)  ? ?Name of the patient: Tiffany Bailey  ?[id number]  ?29-Apr-1928  ? ?Date of visit: 09/10/21 ?REASON FOR COSULTATION:  ?Metastatic breast cancer, patient is currently admitted due to weakness.  Family requested oncology to see patient. ?History of presenting illness-  ?88 y.o. female with PMH listed at below who presents to ER for evaluation of weakness.  Patient was also recently discharged from hospital for UTI.  Patient reported that since discharge from hospital, patient has deteriorated.  Very poor oral intake, no fever cough shortness of breath ?.  Patient has weakness, decreased oral intake, initial CBC showed leukocytosis, transaminitis with AST 759, ALT 477.  Bilirubin was normal at 0.7.  Hepatitis panel negative.  AKI with creatinine increased to 1.8 ?LDH 1541.  CK was elevated at 25, 169. ? ?08/23/2021, chest x-ray showed interval decrease in infiltrate in the right perihilar region suggesting resolving atelectasis/pneumonia.  Increased haziness in the right lower lobe due to layering of small right pleural effusion. ?09/04/2021, ultrasound right upper quadrant showed normal right upper quadrant ultrasound. ?During her last admission, 08/23/2021 patient had CT abdomen pelvis without contrast showed large amount of stool, no evidence of obstruction, hydronephrosis.  Right renal cyst and nonobstructing renal stones. ? ?Patient was admitted for AKI, acute transaminitis, possible sepsis.  Patient was treated with IV  broad-spectrum antibiotics with vancomycin/cefepime and azithromycin for possible HCAP.  UA showed large hemoglobin, negative leukocyte, nitrates, urine culture was negative.  Blood culture was negative. ? ?Patient has very poor oral intake.  NG tube was placed and was removed by patient. ?She is not able to give any history today. ? ?Patient is known to oncology service due to metastatic breast cancer, ER positive, PR negative, HER2 negative.  Patient has been on antiestrogen therapy with fulvestrant monthly.  She is currently off abemaciclib due to poor tolerance ? ? ?Review of Systems  ?Unable to perform ROS: Acuity of condition  ?Constitutional:  Positive for appetite change and fatigue.  ?Eyes:  Negative for eye problems.  ?Respiratory:  Negative for chest tightness and shortness of breath.   ?Cardiovascular:  Negative for chest pain.  ?Gastrointestinal:  Negative for abdominal distention.  ?Neurological:  Negative for headaches.  ? ?Allergies  ?Allergen Reactions  ? Amoxicillin Hives and Nausea Only  ? Gabapentin Other (See Comments)  ?  AMS ?Confusion  ? Mirtazapine Itching  ?  Dry mouth with ODT ?Denies SOB with medication  ? Other   ?  UNKNOWN PAIN MEDICATION - unknown reaction  ? ? ?Patient Active Problem List  ? Diagnosis Date Noted  ? Palliative care encounter   ? Leucocytosis   ? Protein-calorie malnutrition, severe 09/07/2021  ? Sepsis ([place]) 09/08/2021  ? Transaminitis 08/27/2021  ? Failure to thrive in adult 08/16/2021  ? Dehydration   ? Elevated liver enzymes   ? Failure to thrive (child)   ? Lethargic 08/31/2021  ? UTI (urinary tract infection) 08/31/2021  ? Physical deconditioning 08/31/2021  ? Pressure injury of sacral region, stage 2 ([place]) 08/24/2021  ? Hyperglycemia 04/26/2021  ? Encounter for antineoplastic chemotherapy  04/12/2021  ? Pressure injury of skin 03/31/2021  ? Malignant neoplasm of breast in female, estrogen receptor positive ([place]) 03/30/2021  ? Pleural effusion 03/14/2021  ? Cough  02/13/2021  ? Cancer ([place]) 11/07/2020  ? Type 2 diabetes mellitus with diabetic neuropathy, unspecified ([place]) 11/02/2020  ? Osteopenia 10/25/2019  ? Use of anastrozole (Arimidex) 08/18/2019  ? Anemia due to stage 3a chronic kidney disease ([place]) 08/18/2019  ? 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 Nausea 08/03/2019  ? Breast cancer ([place]) 07/19/2019  ? Goals of care, counseling/discussion 07/19/2019  ? Lung mass 02/10/2019  ? Constipation 01/27/2019  ? Headache 01/25/2019  ? Right leg swelling 01/25/2019  ? Metastatic breast cancer ([place]) 01/05/2019  ? Anxiety and depression 12/16/2018  ? Hx of AKA (above knee amputation), left ([place]) 08/26/2018  ? Left arm swelling 07/01/2018  ? Carotid stenosis 12/19/2017  ? CAD (coronary artery disease) 10/23/2017  ? Hb-SS disease without crisis ([place]) 10/23/2017  ? History of CHF (congestive heart failure) 10/23/2017  ? Old myocardial infarction 10/23/2017  ? Hand cramp 10/17/2017  ? Acute non-recurrent maxillary sinusitis 10/17/2017  ? Skin lesion 09/29/2017  ? Diarrhea 04/23/2017  ? Weight loss 09/12/2016  ? PAD (peripheral artery disease) ([place]) 08/14/2016  ? DM (diabetes mellitus) ([place]) 03/25/2016  ? Chronic tension-type headache, not intractable 03/25/2016  ? Essential hypertension 03/25/2016  ? CAD (coronary artery disease), native coronary artery 03/25/2016  ? GERD (gastroesophageal reflux disease) 03/25/2016  ? Hyperlipidemia 03/25/2016  ? History of breast cancer 03/25/2016  ? Acute kidney injury superimposed on chronic kidney disease ([place]) 01/28/2016  ? Hyperkalemia 01/28/2016  ? Hyponatremia 01/28/2016  ? Urinary retention 01/28/2016  ? Hypertensive urgency 01/15/2016  ? History of nonadherence to medical treatment 09/08/2015  ? Epigastric pain 08/10/2014  ? Ischemic heart disease due to coronary artery obstruction ([place]) 01/07/2013  ? ? ? ?Past Medical History:  ?Diagnosis Date  ? Anemia   ? Anginal pain ([place])   ? Arthritis   ? Breast cancer ([place]) 07/19/2019  ? CKD  (chronic kidney disease), stage III ([place])   ? Coronary artery disease   ? a. 2002 CABG x 3 (LIMA-LAD, SVG-D1, SVG-RCA); b. s/p multiple PCI's.  ? Diabetes mellitus without complication ([place])   ? Diastolic dysfunction   ? a. 12/2017 Echo: EF 55-60%, no rwma, Gr1 DD. Triv AI. Mild-mod MR/TR. PASP 70mHg.  ? Edema   ? Failure to thrive in adult   ? Fecal impaction ([facility] 08/23/2021  ? GERD (gastroesophageal reflux disease)   ? Hb-SS disease without crisis ([place] 10/23/2017  ? History of breast cancer   ? HOH (hard of hearing)   ? Hyperlipidemia   ? Hypertension   ? Myocardial infarction ([facility]   ? PAD (peripheral artery disease) ([place]   ? a. s/p L AKA; b. 12/2018 PTA R peroneal & PTA/Stenting R SFA.  ? Palpitations   ? ? ? ?Past Surgical History:  ?Procedure Laterality Date  ? ABDOMINAL HYSTERECTOMY    ? ABOVE KNEE LEG AMPUTATION Left 2013  ? ANTERIOR VITRECTOMY Left 11/16/2015  ? Procedure: ANTERIOR VITRECTOMY;  Surgeon: BEulogio Bear MD;  Location: [facility] ORS;  Service: Ophthalmology;  Laterality: Left;  ? BLADDER SURGERY    ? CATARACT EXTRACTION W/PHACO Left 11/16/2015  ? Procedure: CATARACT EXTRACTION PHACO AND INTRAOCULAR LENS PLACEMENT (IOC);  Surgeon: BEulogio Bear MD;  Location: [facility] ORS;  Service: Ophthalmology;  Laterality: Left;  Lot # [id number] ?[place] 01:22.8 ?AP%:12.6 ?CDE: 10.46  ? CATARACT EXTRACTION  W/PHACO Right 08/22/2016  ? Procedure: CATARACT EXTRACTION PHACO AND INTRAOCULAR LENS PLACEMENT (IOC);  Surgeon: Eulogio Bear, MD;  Location: [facility] ORS;  Service: Ophthalmology;  Laterality: Right;  Lot # [id number] ?[place]: 00:52.1 ?AP%:8.7 ?CDE: 4.99 ?  ? CORONARY ANGIOPLASTY    ? STENT  ? CORONARY ARTERY BYPASS GRAFT    ? LOWER EXTREMITY ANGIOGRAPHY Right 01/14/2019  ? Procedure: LOWER EXTREMITY ANGIOGRAPHY;  Surgeon: Algernon Huxley, MD;  Location: [place] [facility];  Service: Cardiovascular;  Laterality: Right;  ? MASTECTOMY    ? TUMOR REMOVAL    ? ABDOMINAL  ? ? ?Social History  ? ?Socioeconomic History  ? Marital  status: Divorced  ?  Spouse name: Not on file  ? Number of children: 7  ? Years of education: Not on file  ? Highest education level: Not on file  ?Occupational History  ? Occupation: retired  ?  Comment: [employer]

## 2021-09-10 NOTE — Progress Notes (Signed)
Pt has now removed dobhoff. Pt son notified and thinks his mom is "delerious" pt AO x3 at this time. She reports she removed the dobhoff because she thought she would not have to get another one. Denies pulling out IV as noted.  ? ?IV team in room placing new line. NP notified.  ?

## 2021-09-10 NOTE — Consult Note (Addendum)
NAME: Tiffany Bailey  ?DOB: July 15, 1927  ?MRN: [id number]  ?Date/Time: 09/10/2021 12:18 PM ? ?REQUESTING PROVIDER: Dr.Williams ?Subjective:  ?REASON FOR CONSULT: leucocytosis ??Spoke to granddaughter , and chart reviewed completley- spoke to the care team. Pt is a limited historian secondary to fraility ?Tiffany Bailey is a 88 y.o. female with a history of COPD, peripheral vascular disease, left lower extremity amputation, CKD, CAD, sickle cell disease, hypertension, stage IV breast cancer, diabetes mellitus, history of incomplete bladder emptying ?Presented to the ED from home on 09/12/2021 with weakness ? ?Was hospitalized 08/23/2021 until 08/24/2021 for incomplete bladder emptying, urinary retention needing In-N-Out catheters, chronic constipation and Foley catheter placement on discharge. ?Readmitted 3/17 3/19 for worsening weakness and inability to care for herself ?Foley catheter was removed.  She was also given a short course of antibiotics for possible UTI. ?Saw Dr. Erlene Quan as outpatient on 09/04/2021. ?Readmitted on 08/29/2021 with weakness and poor appetite.  In the ED BP 117/41, pulse 52, temperature 97.8, respiratory 16 and sats 99%.  WBC 25.4, Hb 9, platelet 418, potassium 5.5, BUN 55 and creatinine 1.80. ?UA showed WBC of 5-10.  Urine culture no growth. ? ? ?Patient has a past history of CA breast on the left side.  February 04, 2019 while she had an x-ray of the abdomen which showed large colonic stool volume and nodular densities in the right midlung.  This was investigated and a CT chest February 17, 2019 showed mild rtpulmonary nodules and groundglass opacities.  There was also a large subpectoral lymph nodes measuring 3.3 and 2.3 cm.  She was then sent to the heme-onc for further evaluation.  She was further evaluated with a PET scan and heme-onc recommended axillary lymph node biopsy.  But patient and family decided not to proceed with any further work-up.  But in January 2021 underwent axillary lymph node  biopsy and that showed adenocarcinoma with mucinous features.  It was ER positive.  But negative PR and HER2 she was started on Arimidex feb 2021..  This was then changed to  Abemaciclib  which she could not tolerate for long .she had a repeat CT chest on 07/06/2021 which showed persistent right lower lobe lung nodules and pleural effusion.  She was then started on fulvestrant monthly dose. ?As per her son, since the last hospitalization she has deteriorated- no eating or drinking, very weak. Denies any fever, cough, sob, pain abdomen ?In the ED on 3/23 vitals BP 165/54, temp 98.5, HR 82 and sats 97% ?Wbc 25.4, HB 9, PLT 418 ?AST 889, ALT 572, TB 0.8, Alk po4 80, LDH 1541. CK was checked and it was 25,169- CXR showed  interval decrease in infiltrate in the right parahilar region suggesting resolving atelectasis/pneumonia. Increased ?haziness in the right lower lung fields may be due to layering of small right pleural effusion  ?She was started on vanco/cefe[pime and azithromycin to treat like HAP ?She also had a foley placed for urinary retention UC- No growth ?I am asked to see her because of increasing leucocytosis and possible pneumonia ? ?Past Medical History:  ?Diagnosis Date  ? Anemia   ? Anginal pain ([place])   ? Arthritis   ? Breast cancer ([place]) 07/19/2019  ? CKD (chronic kidney disease), stage III ([place])   ? Coronary artery disease   ? a. 2002 CABG x 3 (LIMA-LAD, SVG-D1, SVG-RCA); b. s/p multiple PCI's.  ? Diabetes mellitus without complication ([place])   ? Diastolic dysfunction   ? a. 12/2017 Echo: EF 55-60%,  no rwma, Gr1 DD. Triv AI. Mild-mod MR/TR. PASP 46mHg.  ? Edema   ? Failure to thrive in adult   ? [place]ecal impaction ([place] 08/23/2021  ? GERD (gastroesophageal reflux disease)   ? Hb-SS disease without crisis ([place] 10/23/2017  ? History of breast cancer   ? HOH (hard of hearing)   ? Hyperlipidemia   ? Hypertension   ? Myocardial infarction ([facility]   ? PAD (peripheral artery disease) ([place]   ? a. s/p L AKA; b. 12/2018  PTA R peroneal & PTA/Stenting R SFA.  ? Palpitations   ?  ?Past Surgical History:  ?Procedure Laterality Date  ? ABDOMINAL HYSTERECTOMY    ? ABOVE KNEE LEG AMPUTATION Left 2013  ? ANTERIOR VITRECTOMY Left 11/16/2015  ? Procedure: ANTERIOR VITRECTOMY;  Surgeon: BEulogio Bear MD;  Location: [facility] ORS;  Service: Ophthalmology;  Laterality: Left;  ? BLADDER SURGERY    ? CATARACT EXTRACTION W/PHACO Left 11/16/2015  ? Procedure: CATARACT EXTRACTION PHACO AND INTRAOCULAR LENS PLACEMENT (IOC);  Surgeon: BEulogio Bear MD;  Location: [facility] ORS;  Service: Ophthalmology;  Laterality: Left;  Lot # [id number] ?[place] 01:22.8 ?AP%:12.6 ?CDE: 10.46  ? CATARACT EXTRACTION W/PHACO Right 08/22/2016  ? Procedure: CATARACT EXTRACTION PHACO AND INTRAOCULAR LENS PLACEMENT (IOC);  Surgeon: BEulogio Bear MD;  Location: [facility] ORS;  Service: Ophthalmology;  Laterality: Right;  Lot # [id number] ?[place] 00:52.1 ?AP%:8.7 ?CDE: 4.99 ?  ? CORONARY ANGIOPLASTY    ? STENT  ? CORONARY ARTERY BYPASS GRAFT    ? LOWER EXTREMITY ANGIOGRAPHY Right 01/14/2019  ? Procedure: LOWER EXTREMITY ANGIOGRAPHY;  Surgeon: DAlgernon Huxley MD;  [facility];  Service: Cardiovascular;  Laterality: Right;  ? MASTECTOMY    ? TUMOR REMOVAL    ? ABDOMINAL  ?  ?Social History  ? ?Socioeconomic History  ? Marital status: Divorced  ?  Spouse name: Not on file  ? Number of children: 7  ? Years of education: Not on file  ? Highest education level: Not on file  ?Occupational History  ? Occupation: retired  ?  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 Smoking status: Never  ? Smokeless tobacco: Never  ?Vaping Use  ? Vaping Use: Never used  ?Substance and Sexual Activity  ? Alcohol use: No  ? Drug use: No  ? Sexual activity: Not Currently  ?Other Topics Concern  ? Not on file  ?Social History Narrative  ? Not on file  ? ?Social Determinants of Health  ? ?Financial Resource Strain: Low Risk   ? Difficulty of Paying Living Expenses: Not hard at all  ?Food Insecurity: No Food  Insecurity  ? Worried About RCharity fundraiserin the Last Year: Never true  ? Ran Out of Food in the Last Year: Never true  ?Transportation Needs: No Transportation Needs  ? Lack of Transportation (Medical): No  ? Lack of Transportation (Non-Medical): No  ?Physical Activity: Insufficiently Active  ? Days of Exercise per Week: 7 days  ? Minutes of Exercise per Session: 10 min  ?Stress: No Stress Concern Present  ? Feeling of Stress : Not at all  ?Social Connections: Moderately Isolated  ? Frequency of Communication with Friends and Family: More than three times a week  ? Frequency of Social Gatherings with Friends and Family: More than three times a week  ? Attends Religious Services: More than 4 times per year  ? Active Member of Clubs or Organizations: No  ? Attends Club  or Organization Meetings: Never  ? Marital Status: Divorced  ?Intimate Partner Violence: Not At Risk  ? Fear of Current or Ex-Partner: No  ? Emotionally Abused: No  ? Physically Abused: No  ? Sexually Abused: No  ?  ?Family History  ?Problem Relation Age of Onset  ? Sudden death Mother   ? Arthritis Father   ? Stroke Father   ? Hypertension Sister   ? Hypertension Sister   ? Diabetes Grandchild   ? ?Allergies  ?Allergen Reactions  ? Amoxicillin Hives and Nausea Only  ? Gabapentin Other (See Comments)  ?  AMS ?Confusion  ? Mirtazapine Itching  ?  Dry mouth with ODT ?Denies SOB with medication  ? Other   ?  UNKNOWN PAIN MEDICATION - unknown reaction  ? ?I? ?Current Facility-Administered Medications  ?Medication Dose Route Frequency Provider Last Rate Last Admin  ? (feeding supplement) PROSource Plus liquid 30 mL  30 mL Oral TID BM Wyvonnia Dusky, MD      ? acetaminophen (TYLENOL) tablet 650 mg  650 mg Oral Q6H PRN Wyvonnia Dusky, MD      ? azithromycin (ZITHROMAX) 500 mg in sodium chloride 0.9 % 250 mL IVPB  500 mg Intravenous Q24H Oswald Hillock, RPH 250 mL/hr at 09/10/21 1200 Infusion Verify at 09/10/21 1200  ? ceFEPIme (MAXIPIME) 2 g  in sodium chloride 0.9 % 100 mL IVPB  2 g Intravenous Q24H Rauer, Forde Dandy, RPH   Stopped at 09/09/21 1450  ? Chlorhexidine Gluconate Cloth 2 % PADS 6 each  6 each Topical Daily Eppie Gibson M,

## 2021-09-10 NOTE — Consult Note (Signed)
Pharmacy Antibiotic Note ? ?Tiffany Bailey is a 88 y.o. female admitted on 09/09/2021 with sepsis.  Pharmacy has been consulted for vancomycin dosing ? ? ?Plan: ?Pt was loaded with vancomycin 1000 mg x 1 on 3/26. Will start vancomycin 1000 mg q48H. Scr at baseline. Predicted AUC of 559. Goal AUC 400-600. Scr used 1.34. Vd 0.72. Plan to obtain vancomycin level after the 3rd or 4th dose. MRSA PCR is negative.  ? ?Continue cefepime 2 g q24H x 7 days.  ? ?Continue azithromycin 500 mg daily x 5 doses ? ?Height: 5' (152.4 cm) ?Weight: 43.1 kg (95 lb 0.3 oz) ?IBW/kg (Calculated) : 45.5 ? ?Temp (24hrs), Avg:98.5 ?F (36.9 ?C), Min:98.2 ?F (36.8 ?C), Max:98.8 ?F (37.1 ?C) ? ?Recent Labs  ?Lab 09/08/2021 ?1333 08/17/2021 ?1955 09/07/21 ?0441 09/08/21 ?0355 09/09/21 ?0505 09/09/21 ?1244 09/10/21 ?0351  ?WBC  --   --  30.5* 32.7* 36.1* 36.4* 31.9*  ?CREATININE  --  1.53* 1.58* 1.40* 1.48*  --  1.34*  ?LATICACIDVEN 1.6 1.4  --   --   --   --   --   ?VANCORANDOM  --   --   --   --   --   --  21  ? ?  ?Estimated Creatinine Clearance: 18.6 mL/min (A) (by C-G formula based on SCr of 1.34 mg/dL (H)).   ? ?Allergies  ?Allergen Reactions  ? Amoxicillin Hives and Nausea Only  ? Gabapentin Other (See Comments)  ?  AMS ?Confusion  ? Mirtazapine Itching  ?  Dry mouth with ODT ?Denies SOB with medication  ? Other   ?  UNKNOWN PAIN MEDICATION - unknown reaction  ? ? ?Antimicrobials this admission: ?3/23 Cefepime >>  ?3/24 Azithromycin >>  ?3/26 Vancomycin >> ? ?Microbiology results: ?3/23 BCx: NGTD ?3/24 UCx: NGTD  ?3/26 MRSA PCR: negative  ? ?Thank you for allowing pharmacy to be a part of this patient?s care. ? ?Oswald Hillock, PharmD, ?Clinical Pharmacist ?09/10/2021 ?9:29 AM ? ? ?

## 2021-09-10 NOTE — Progress Notes (Addendum)
0730 on arrival with bedside report son [name] at bedside very upset he wants patient to have NG tube states patient shouldn't be able to make decisions because she has not eaten in days and its causing delirium. [name] wants Feeding tube placed or patient transferred to [place] Attending paged agreeable to have another meeting with patient and family. Patient alert orientated to situation and when TF discussed she closes her eyes and turns her head when asking patient if she understands patient states "yes". ?0900 patient want Attending ETA Attending paged and states she will be here asap ?1030 Patient son [name] in room and patient niece met with Attending and patient regarding plan of care patient still states she doesn't want the feeding NG tube that was placed 3x already and patient pulled out.  ?1212 multiple family members in room asking for palliative consult stating that all of them besides [name] want to respect patient wishes.  ?[place] asking that NG tube be placed by 4pm or patient to be placed other family present patient asked if she wanted NG tube and she closes her eyes and nods "no". [name] informed that I could not place a tube in patient if she didn't want it to feed her that she was alert. [name] quickly raised to his feet raising her voice that he wanted social work now,I would not even finish my sentence as he kept repeating he wanted social work I walked out of the room after telling him ok I will get a hold of someone [name] followed me to the front desk as I asked for help in locating the social workers phone I left a message and paged her. [name] then went back to patient room in 235.  ?1515 patient agreeable to take multivitamin and then would not swallow it she kept it in her mouth for more than 10 minutes before spitting it out then asking for water and continuing to spit that out until the vitamin was completely washed from her oral cavity.  ?1750 patient agreeable for chest CT called radiology  patient going to have CT completed ?1815 patient agreeable to eat dinner holding food in mouth will not swallow then spit out all food then refused to eat anymore daughter at bedside. ? ?

## 2021-09-10 NOTE — Progress Notes (Signed)
OT Cancellation Note ? ?Patient Details ?Name: Tiffany Bailey ?MRN: [id number] ?DOB: 08-07-1927 ? ? ?Cancelled Treatment:    Reason Eval/Treat Not Completed: Fatigue/lethargy limiting ability to participate. Upon arrival to room, pt asleep in bed with 4 family members present. Per chart review, pt with decreased alertness this date. Per chart review and discussion with family, family wishing to speak with palliative team and then continue OT services following discussion regarding goals of care; RN informed. OT to hold this date and re-attempt following goals of care discussion with family.  ? ?Sharilyn Sites Matt Delpizzo ?09/10/2021, 2:25 PM ?

## 2021-09-11 ENCOUNTER — Inpatient Hospital Stay: Payer: Medicare Other

## 2021-09-11 DIAGNOSIS — E870 Hyperosmolality and hypernatremia: Secondary | ICD-10-CM | POA: Diagnosis not present

## 2021-09-11 DIAGNOSIS — D72829 Elevated white blood cell count, unspecified: Secondary | ICD-10-CM | POA: Diagnosis not present

## 2021-09-11 DIAGNOSIS — E860 Dehydration: Secondary | ICD-10-CM | POA: Diagnosis not present

## 2021-09-11 DIAGNOSIS — R627 Adult failure to thrive: Secondary | ICD-10-CM | POA: Diagnosis not present

## 2021-09-11 DIAGNOSIS — R531 Weakness: Secondary | ICD-10-CM | POA: Diagnosis not present

## 2021-09-11 DIAGNOSIS — R748 Abnormal levels of other serum enzymes: Secondary | ICD-10-CM | POA: Diagnosis not present

## 2021-09-11 LAB — CBC
HCT: 25 % — ABNORMAL LOW (ref 36.0–46.0)
Hemoglobin: 8.2 g/dL — ABNORMAL LOW (ref 12.0–15.0)
MCH: 31.4 pg (ref 26.0–34.0)
MCHC: 32.8 g/dL (ref 30.0–36.0)
MCV: 95.8 fL (ref 80.0–100.0)
Platelets: 372 10*3/uL (ref 150–400)
RBC: 2.61 MIL/uL — ABNORMAL LOW (ref 3.87–5.11)
RDW: 15.9 % — ABNORMAL HIGH (ref 11.5–15.5)
WBC: 29.9 10*3/uL — ABNORMAL HIGH (ref 4.0–10.5)
nRBC: 0.1 % (ref 0.0–0.2)

## 2021-09-11 LAB — CULTURE, BLOOD (ROUTINE X 2)
Culture: NO GROWTH
Culture: NO GROWTH
Special Requests: ADEQUATE

## 2021-09-11 LAB — COMPREHENSIVE METABOLIC PANEL
ALT: 554 U/L — ABNORMAL HIGH (ref 0–44)
AST: 327 U/L — ABNORMAL HIGH (ref 15–41)
Albumin: 2.3 g/dL — ABNORMAL LOW (ref 3.5–5.0)
Alkaline Phosphatase: 84 U/L (ref 38–126)
Anion gap: 9 (ref 5–15)
BUN: 61 mg/dL — ABNORMAL HIGH (ref 8–23)
CO2: 23 mmol/L (ref 22–32)
Calcium: 9.3 mg/dL (ref 8.9–10.3)
Chloride: 114 mmol/L — ABNORMAL HIGH (ref 98–111)
Creatinine, Ser: 1.41 mg/dL — ABNORMAL HIGH (ref 0.44–1.00)
GFR, Estimated: 35 mL/min — ABNORMAL LOW (ref >60)
Glucose, Bld: 177 mg/dL — ABNORMAL HIGH (ref 70–99)
Potassium: 4.3 mmol/L (ref 3.5–5.1)
Sodium: 146 mmol/L — ABNORMAL HIGH (ref 135–145)
Total Bilirubin: 0.7 mg/dL (ref 0.3–1.2)
Total Protein: 5 g/dL — ABNORMAL LOW (ref 6.5–8.1)

## 2021-09-11 LAB — GLUCOSE, CAPILLARY
Glucose-Capillary: 161 mg/dL — ABNORMAL HIGH (ref 70–99)
Glucose-Capillary: 161 mg/dL — ABNORMAL HIGH (ref 70–99)
Glucose-Capillary: 187 mg/dL — ABNORMAL HIGH (ref 70–99)
Glucose-Capillary: 193 mg/dL — ABNORMAL HIGH (ref 70–99)
Glucose-Capillary: 224 mg/dL — ABNORMAL HIGH (ref 70–99)
Glucose-Capillary: 257 mg/dL — ABNORMAL HIGH (ref 70–99)
Glucose-Capillary: 287 mg/dL — ABNORMAL HIGH (ref 70–99)

## 2021-09-11 LAB — LEGIONELLA PNEUMOPHILA SEROGP 1 UR AG: L. pneumophila Serogp 1 Ur Ag: NEGATIVE

## 2021-09-11 LAB — CK: Total CK: 5201 U/L — ABNORMAL HIGH (ref 38–234)

## 2021-09-11 MED ORDER — DEXTROSE 5 % IV SOLN: INTRAVENOUS | Status: DC

## 2021-09-11 NOTE — Progress Notes (Signed)
? ?Date of Admission:  09/10/2021    ? ?ID: Tiffany Bailey is a 88 y.o. female    ?Principal Problem: ?  Sepsis ([place]) ?Active Problems: ?  DM (diabetes mellitus) ([place]) ?  Essential hypertension ?  CAD (coronary artery disease), native coronary artery ?  GERD (gastroesophageal reflux disease) ?  PAD (peripheral artery disease) ([place]) ?  Hx of AKA (above knee amputation), left ([place]) ?  Anxiety and depression ?  Acute kidney injury superimposed on chronic kidney disease ([place]) ?  Metastatic breast cancer ([place]) ?  Hb-SS disease without crisis ([place]) ?  Hyperkalemia ?  Goals of care, counseling/discussion ?  Pleural effusion ?  UTI (urinary tract infection) ?  Physical deconditioning ?  Transaminitis ?  Failure to thrive in adult ?  Dehydration ?  Elevated liver enzymes ?  Protein-calorie malnutrition, severe ?  Palliative care encounter ?  Leucocytosis ? ? ? ?Subjective: ?Pt in bed ?Non verbal ?Son at bed side ? ? ?Medications:  ? (feeding supplement) PROSource Plus  30 mL Oral TID BM  ? Chlorhexidine Gluconate Cloth  6 each Topical Daily  ? dronabinol  2.5 mg Oral QAC lunch  ? feeding supplement  237 mL Oral BID BM  ? heparin  5,000 Units Subcutaneous Q8H  ? multivitamin with minerals  1 tablet Oral Daily  ? ? ?Objective: ?Vital signs in last 24 hours: ?Temp:  [97.9 ?F (36.6 ?C)-98.6 ?F (37 ?C)] 97.9 ?F (36.6 ?C) (03/28 1151) ?Pulse Rate:  [92-117] 96 (03/28 1151) ?Resp:  [18-20] 20 (03/28 1151) ?BP: (162-190)/(50-87) 163/65 (03/28 1151) ?SpO2:  [98 %-100 %] 99 % (03/28 1151) ?Weight:  [41.7 kg] 41.7 kg (03/28 0500) ? ?PHYSICAL EXAM:  ?General: somnolent ?Lungs: b/l air entry- decreased bases ?Heart: Regular rate and rhythm, no murmur, rub or gallop. ?Abdomen: Soft, non-tender,not distended. Bowel sounds normal. No masses ?Extremities: atraumatic, no cyanosis. No edema. No clubbing ?Skin: No rashes or lesions. Or bruising ?Lymph: Cervical, supraclavicular normal. ?Neurologic: cannot assess ? ?Lab Results ?Recent Labs  ?   09/10/21 ?0351 09/11/21 ?0653  ?WBC 31.9* 29.9*  ?HGB 7.9* 8.2*  ?HCT 25.1* 25.0*  ?NA 143 146*  ?K 4.6 4.3  ?CL 112* 114*  ?CO2 20* 23  ?BUN 60* 61*  ?CREATININE 1.34* 1.41*  ? ?Liver Panel ?Recent Labs  ?  09/10/21 ?0351 09/11/21 ?0653  ?PROT 5.3* 5.0*  ?ALBUMIN 2.5* 2.3*  ?AST 439* 327*  ?ALT 590* 554*  ?ALKPHOS 85 84  ?BILITOT 1.0 0.7  ? ?Sedimentation Rate ?No results for input(s): ESRSEDRATE in the last 72 hours. ?C-Reactive Protein ?No results for input(s): CRP in the last 72 hours. ? ?Microbiology: ? ?Studies/Results: ?CT CHEST WO CONTRAST ? ?Result Date: 09/10/2021 ?CLINICAL DATA:  A 88 year old female presents for evaluation of abnormal chest findings. EXAM: CT CHEST WITHOUT CONTRAST TECHNIQUE: Multidetector CT imaging of the chest was performed following the standard protocol without IV contrast. RADIATION DOSE REDUCTION: This exam was performed according to the departmental dose-optimization program which includes automated exposure control, adjustment of the mA and/or kV according to patient size and/or use of iterative reconstruction technique. COMPARISON:  July 06, 2021. FINDINGS: Cardiovascular: Calcified atheromatous plaque of the thoracic aorta. No aneurysmal dilation. Heart size normal without pericardial effusion. Marked calcified coronary artery disease as before. Central pulmonary vasculature is normal caliber. Mediastinum/Nodes: Thoracic inlet structures are normal. No signs of axillary lymphadenopathy. Mediastinal adenopathy with similar distribution, (image 60/2) RIGHT paratracheal lymph node at 18 mm previously 24 mm short  axis. Peripherally calcified subcarinal lymph node at 22 mm short axis, similar size when measured by this observer on the prior exam. No gross hilar adenopathy. No new signs of nodal enlargement. Mildly patulous esophagus. Lungs/Pleura: (Image 20/3) 6 mm LEFT upper lobe pulmonary nodule. (Image 76/3) stable 13 x 9 mm LEFT upper lobe ground-glass nodule. Other LEFT  upper lobe area of ground-glass is unchanged. RIGHT upper lobe ground-glass/subsolid nodule measuring 14 x 10 mm previously 17 x 11 mm. Peri fissural nodules in the RIGHT upper lobe are also stable the largest at 6 mm. (Image 54/3) Small nodules in the RIGHT middle lobe ir also unchanged as is a Peri fissural nodule in the accessory fissure in the RIGHT lower lobe. Juxta diaphragmatic nodule along the pleural surface in the RIGHT lower lobe measuring up to 14 mm greatest axial dimension is stable (image 122/3) Interval development of moderate RIGHT and small LEFT pleural effusions with basilar collapse. Airways are patent. Upper Abdomen: No acute findings in the upper abdomen. No upper abdominal lymphadenopathy. Musculoskeletal: No acute bone finding. No destructive bone process. Spinal degenerative changes. IMPRESSION: 1. Interval development of moderate RIGHT and small LEFT pleural effusions with basilar collapse. 2. Ground-glass and subsolid as well as solid nodules in the LEFT and RIGHT chest are stable. 3. Pleural based and or pleural-parenchymal nodule over the RIGHT hemidiaphragm also stable. 4. Diminished size of RIGHT paratracheal lymph node and stable appearance of subcarinal nodal tissue. 5. Stable appearance of mediastinal adenopathy. 6. Marked calcified coronary artery disease. Aortic Atherosclerosis (ICD10-I70.0). Electronically Signed   By: Zetta Bills M.D.   On: 09/10/2021 18:20  ? ?DG Chest Port 1 View ? ?Result Date: 09/09/2021 ?CLINICAL DATA:  Nasogastric tube placement. EXAM: PORTABLE CHEST 1 VIEW COMPARISON:  Chest radiograph dated 09/07/2021. FINDINGS: The heart is normal in size. Vascular calcifications are seen in the aortic arch. A layering right pleural effusion with associated atelectasis/airspace disease appears similar to prior exam. There is no definite left pleural effusion. The left lung is clear. There is no pneumothorax. Median sternotomy wires are noted. An enteric tube  terminates in the stomach. Degenerative changes are seen in the spine. IMPRESSION: Enteric tube terminates in the stomach. Electronically Signed   By: Zerita Boers M.D.   On: 09/09/2021 20:14   ? ? ?Assessment/Plan: ?Metastatic ca breast with lung nodules on the rt side ?  ?Weakness- secondary to poor intake- rhabdo may be playing a role ?  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 ?Transaminitis ( normal bili and normal alk phos) could be due to rhabdo  or secondary to liver injury from medication ?Improving ?  ?Leucocytosis- reactive vs inflammatory VS infection ?Urine culture, blood culture neg ?Could it be due to pleural effusion?  ?Pleurocentesis with culture, cell count and chem may help . As no resp symptoms, no need for oxygen observe off antibiotics ?  ?Rt lung infiltrates and pleural effusion- malignancy VS post obstructive pneumonia VS aspiration VS HAP ?Pt is not symptomatic ?Had received 4 days of azithromycin and 4 days of vanco/cefepime- MRSA nares neg- DC vanco and cefepime on 09/10/21- and l azithromycin today ?Will observe off antibiotics after ?  ?AKI ?  ?Incomplete urinary emptying- chronic constipation contributing to it ?  ?  ?Sarcopenia and frailty of old age ? ?Poor intake- son says they are going to place NG tube this afternoon  and he would like to feed her for a few days to see whether she will improve ?  ?

## 2021-09-11 NOTE — Progress Notes (Signed)
PT Cancellation Note ? ?Patient Details ?Name: Tiffany Bailey ?MRN: [id number] ?DOB: 01/21/1928 ? ? ?Cancelled Treatment:    Reason Eval/Treat Not Completed: Other (comment).  Chart reviewed and attempted to see pt.  Daughter present in room upon arrival.  Pt notes to be lethargic and unable to keep her eyes open >10 seconds.  Daughter unsure of palliative options at this time.  Therapist will continue to monitor and see pt as medically appropriate.   ? ? ?Gwenlyn Saran, PT, DPT ?09/11/21, 12:45 PM ? ?

## 2021-09-11 NOTE — Progress Notes (Addendum)
?PROGRESS NOTE ? ? ? ?HPI was taken from Dr. Sheppard Coil: ?Tiffany Bailey is a 88 y.o. female with history of diabetes, CAD, hypertension, recent hospitalization for UTI, CHF, along other medical problems, see past medical history, presents emergency department with weakness.  Patient was recently admitted to the hospital for UTI and discharged 4 days ago.  Patient had an acute UTI and Foley have been placed.  Daughter states that she has been getting her up to go the bathroom frequently.  Has not noticed any smell to the urine.  Has had no fever that she knows of.  No chest pain or shortness of breath.  No cough or congestion.  No vomiting or diarrhea.  States that she has not been wanting to eat or drink for the last several days. ?In ED: ?Labs: ?Urinalysis/urine culture pending collection as of time of admission ?Creatinine elevated to 1.8 above baseline 1.3-1.4, GFR 26 decreased from baseline in 30s ?Anemia relatively stable, hemoglobin 9.0, was 7.8 on discharge 4 days ago ?White blood cells elevated to 25.4, was 11.6 on discharge from hospital 4 days ago.   ?Platelets 418 ?Hepatic function panel demonstrated sharp increase in AST/ALT, which were 150/70 respectively 9 days ago, are 759/477 respectively now ?Reduced total protein/albumin ?Lactic acid 1.6 ?Respiratory panel negative for COVID-19, influenza A/B ? ?Imaging:  ?Chest x-ray showed interval decrease in infiltrate in right perihilar region resolving atelectasis/pneumonia.  Increased haziness in right lower lung fields may be due to layering of small right pleural effusion ?RUQ ultrasound to work-up elevated AST/ALT was normal ?EKG no ST/T changes concerning for ischemia  ?Treatment: ?Antibiotics: Aztreonam, Vancomycin, metronidazole, cefepime ?IV fluids: Sodium chloride 0.9% bolus total 1500 mL, currently on lactated Ringer's infusion ?  ?Hospital course from Dr. Jimmye Norman 3/24-3/28/23: Pt has had multiple medical problems as stated below and especially  failure to thrive. Pt does not want to eat and/or drink much of anything. Pt's family requested that a feeding tube be placed but a NG tube was placed x 3 and each time the pt pulled out the NG tube. Pt told her son Tiffany Bailey and granddaughter at bedside on 09/10/21 that she did not want a feeding tube. Pt is alert and oriented and able to make decisions for herself. Palliative care is following and recs apprec. See A&P below for more information ? ? ? ?Tiffany Bailey  DTO:[id number] DOB: 26-Sep-1927 DOA: 08/17/2021 ?PCP: [name], FNP  ? ? ? ?Assessment & Plan: ?  ?Principal Problem: ?  Sepsis ([place]) ?Active Problems: ?  DM (diabetes mellitus) ([place]) ?  Essential hypertension ?  CAD (coronary artery disease), native coronary artery ?  GERD (gastroesophageal reflux disease) ?  PAD (peripheral artery disease) ([place]) ?  Hx of AKA (above knee amputation), left ([place]) ?  Anxiety and depression ?  Acute kidney injury superimposed on chronic kidney disease ([place]) ?  Metastatic breast cancer ([place]) ?  Hb-SS disease without crisis ([place]) ?  Hyperkalemia ?  Goals of care, counseling/discussion ?  Pleural effusion ?  UTI (urinary tract infection) ?  Physical deconditioning ?  Transaminitis ?  Failure to thrive in adult ?  Dehydration ?  Elevated liver enzymes ?  Protein-calorie malnutrition, severe ?  Palliative care encounter ?  Leucocytosis ? ?Failure to thrive in adult: w/ poor po intake as well. NG placed x3 and pulled out 3 times by pt. Pt stated she did not want a feeding tube 09/10/21 but pt's son Tiffany Bailey would like for the  pt to have a feeding tube despite this. Palliative care following and recs  ? ?Sepsis: met criteria w/ leukocytosis, tachypnea & likely UTI. Blood cxs no growth. Urine cx shows no growth. Repeat procal 0.85. Resolved ? ?Transaminitis: etiology unclear, possibly shock liver. Hepatitis panel is neg. [place] of RUQ was normal. AST and ALT are both trending down today  ? ?Hypernatremia: free water deficit 0.8L. Change  IVFs to D5W  ? ?Hyperkalemia: resolved  ?  ?AKI on CKD: baseline Cr/GFR is unknown, currently stage IIIb. Likely secondary to rhabdomyolysis. Cr is labile  ? ?Possible post obstructive pneumonia: as per ID.  D/c IV abxs as per ID.  Continue on bronchodilators & encourage incentive spirometry. Strep is neg and legionella is pending still  ? ?Rhabdomyolysis:  significant. Etiology unclear. Still elevated continues trend down daily.  ?  ?UTI: urine cx shows no growth. UA was positive on admission and abxs started before cx was taken. Abxs were d/c as per ID  ? ?Leukocytosis: significant & trending down today. Etiology unclear, infection vs blood dyscrasia vs medication ADRs.  Onco & ID following and recs apprec  ? ?Thrombocytosis: resolved  ? ?DM2: poorly controlled, HbA1c 8.5. Continue on SSI w/ accuchecks.  ? ?Metastatic breast cancer: not candidate for aggressive treatment as per onco. Onco following and rec apprec ?  ?Pleural effusion: small as per XR. Not requiring supplemental oxygen  ? ?Severe protein calorie malnutrition: pt is refusing nutritional supplements. NG tube placed x 3 and pulled out 3 times by pt. Pt stated this that she did not want a feeding tube on 09/10/21.  ? ? ?DVT prophylaxis: heparin ?Code Status: DNR ?Family Communication: discussed pt's care w/ pt's daughter, Tiffany Bailey, and answered her questions  ?Disposition Plan:  unclear ? ?Level of care: Progressive ? ?Status is: Inpatient ?Remains inpatient appropriate because: severity of illness, see all above  ? ? ? ?Consultants:  ?Palliative care ? ?Procedures:  ? ?Antimicrobials:  ? ? ?Subjective: ?Pt c/o malaise   ? ?Objective: ?Vitals:  ? 09/11/21 0400 09/11/21 0500 09/11/21 0530 09/11/21 1610  ?BP: (!) 173/72  (!) 174/72 (!) 169/50  ?Pulse: 92  93   ?Resp: 18     ?Temp: 98.3 ?F (36.8 ?C)     ?TempSrc: Oral     ?SpO2: 100%     ?Weight:  41.7 kg    ?Height:      ? ? ?Intake/Output Summary (Last 24 hours) at 09/11/2021 0744 ?Last data filed at  09/11/2021 0700 ?Gross per 24 hour  ?Intake 2043.81 ml  ?Output 1170 ml  ?Net 873.81 ml  ? ?Filed Weights  ? 09/10/2021 1115 09/10/21 0500 09/11/21 0500  ?Weight: 43 kg 43.1 kg 41.7 kg  ? ? ?Examination: ? ?General exam: appears uncomfortable. Frail appearing  ?Respiratory system: decreased breath sounds b/l  ?Cardiovascular system: S1/S2+. No rubs or gallops ?Gastrointestinal system: Abd is soft, NT, ND & hypoactive bowel sounds ?Central nervous system: Alert and oriented. Moves all extremities ?Psychiatry: judgement and insight appears at baseline. Flat mood and affect  ? ? ? ?Data Reviewed: I have personally reviewed following labs and imaging studies ? ?CBC: ?Recent Labs  ?Lab 09/08/21 ?0355 09/09/21 ?0505 09/09/21 ?1244 09/10/21 ?9604 09/11/21 ?5409  ?WBC 32.7* 36.1* 36.4* 31.9* 29.9*  ?NEUTROABS  --   --  31.9*  --   --   ?HGB 8.3* 8.2* 8.3* 7.9* 8.2*  ?HCT 25.8* 24.8* 25.7* 25.1* 25.0*  ?MCV 97.0 95.0 98.8 99.6 95.8  ?PLT 430*  437* 433* 375 372  ? ?Basic Metabolic Panel: ?Recent Labs  ?Lab 08/17/2021 ?1955 09/07/21 ?0441 09/08/21 ?0355 09/08/21 ?1438 09/09/21 ?0505 09/10/21 ?0351  ?NA 136 138 138  --  142 143  ?K 5.3* 5.3* 5.5* 5.3* 4.7 4.6  ?CL 110 109 110  --  110 112*  ?CO2 15* 17* 17*  --  20* 20*  ?GLUCOSE 163* 145* 208*  --  267* 260*  ?BUN 54* 54* 57*  --  63* 60*  ?CREATININE 1.53* 1.58* 1.40*  --  1.48* 1.34*  ?CALCIUM 9.1 9.2 9.1  --  9.2 9.0  ?PHOS 4.5  --   --   --   --   --   ? ?GFR: ?Estimated Creatinine Clearance: 18 mL/min (A) (by C-G formula based on SCr of 1.34 mg/dL (H)). ?Liver Function Tests: ?Recent Labs  ?Lab 08/17/2021 ?1955 09/07/21 ?0441 09/08/21 ?0355 09/09/21 ?0505 09/10/21 ?0351  ?AST 889* 902* 672* 591* 439*  ?ALT 572* 589* 590* 650* 590*  ?ALKPHOS 80 76 87 86 85  ?BILITOT 0.8 0.7 0.9 1.0 1.0  ?PROT 6.4* 6.1* 5.6* 5.7* 5.3*  ?ALBUMIN 3.1* 3.1* 2.9* 2.7* 2.5*  ? ?No results for input(s): LIPASE, AMYLASE in the last 168 hours. ?No results for input(s): AMMONIA in the last 168  hours. ?Coagulation Profile: ?Recent Labs  ?Lab 09/07/21 ?0441  ?INR 1.1  ? ?Cardiac Enzymes: ?Recent Labs  ?Lab 09/13/2021 ?1953 09/08/21 ?1537 09/09/21 ?0505 09/10/21 ?0351  ?CKTOTAL 28,709* 25,169* 14,476* 7,717*  ? ?BN

## 2021-09-12 DIAGNOSIS — M6282 Rhabdomyolysis: Secondary | ICD-10-CM | POA: Diagnosis not present

## 2021-09-12 DIAGNOSIS — R627 Adult failure to thrive: Secondary | ICD-10-CM | POA: Diagnosis not present

## 2021-09-12 DIAGNOSIS — R531 Weakness: Secondary | ICD-10-CM | POA: Diagnosis not present

## 2021-09-12 DIAGNOSIS — D72829 Elevated white blood cell count, unspecified: Secondary | ICD-10-CM | POA: Diagnosis not present

## 2021-09-12 DIAGNOSIS — R7401 Elevation of levels of liver transaminase levels: Secondary | ICD-10-CM | POA: Diagnosis not present

## 2021-09-12 LAB — COMPREHENSIVE METABOLIC PANEL
ALT: 523 U/L — ABNORMAL HIGH (ref 0–44)
AST: 308 U/L — ABNORMAL HIGH (ref 15–41)
Albumin: 2.4 g/dL — ABNORMAL LOW (ref 3.5–5.0)
Alkaline Phosphatase: 91 U/L (ref 38–126)
Anion gap: 8 (ref 5–15)
BUN: 56 mg/dL — ABNORMAL HIGH (ref 8–23)
CO2: 22 mmol/L (ref 22–32)
Calcium: 9.2 mg/dL (ref 8.9–10.3)
Chloride: 109 mmol/L (ref 98–111)
Creatinine, Ser: 1.52 mg/dL — ABNORMAL HIGH (ref 0.44–1.00)
GFR, Estimated: 32 mL/min — ABNORMAL LOW (ref >60)
Glucose, Bld: 323 mg/dL — ABNORMAL HIGH (ref 70–99)
Potassium: 4.1 mmol/L (ref 3.5–5.1)
Sodium: 139 mmol/L (ref 135–145)
Total Bilirubin: 0.7 mg/dL (ref 0.3–1.2)
Total Protein: 5.2 g/dL — ABNORMAL LOW (ref 6.5–8.1)

## 2021-09-12 LAB — GLUCOSE, CAPILLARY
Glucose-Capillary: 210 mg/dL — ABNORMAL HIGH (ref 70–99)
Glucose-Capillary: 217 mg/dL — ABNORMAL HIGH (ref 70–99)
Glucose-Capillary: 240 mg/dL — ABNORMAL HIGH (ref 70–99)
Glucose-Capillary: 284 mg/dL — ABNORMAL HIGH (ref 70–99)
Glucose-Capillary: 295 mg/dL — ABNORMAL HIGH (ref 70–99)
Glucose-Capillary: 324 mg/dL — ABNORMAL HIGH (ref 70–99)

## 2021-09-12 LAB — CBC
HCT: 26.7 % — ABNORMAL LOW (ref 36.0–46.0)
Hemoglobin: 8.7 g/dL — ABNORMAL LOW (ref 12.0–15.0)
MCH: 32.1 pg (ref 26.0–34.0)
MCHC: 32.6 g/dL (ref 30.0–36.0)
MCV: 98.5 fL (ref 80.0–100.0)
Platelets: 379 10*3/uL (ref 150–400)
RBC: 2.71 MIL/uL — ABNORMAL LOW (ref 3.87–5.11)
RDW: 15.9 % — ABNORMAL HIGH (ref 11.5–15.5)
WBC: 32.2 10*3/uL — ABNORMAL HIGH (ref 4.0–10.5)
nRBC: 0.1 % (ref 0.0–0.2)

## 2021-09-12 LAB — LEGIONELLA PNEUMOPHILA SEROGP 1 UR AG: L. pneumophila Serogp 1 Ur Ag: NEGATIVE

## 2021-09-12 LAB — ALDOLASE: Aldolase: 129.5 U/L — ABNORMAL HIGH (ref 3.3–10.3)

## 2021-09-12 LAB — PHOSPHORUS
Phosphorus: 2.4 mg/dL — ABNORMAL LOW (ref 2.5–4.6)
Phosphorus: 2.1 mg/dL — ABNORMAL LOW (ref 2.5–4.6)

## 2021-09-12 LAB — CK: Total CK: 5024 U/L — ABNORMAL HIGH (ref 38–234)

## 2021-09-12 LAB — MAGNESIUM
Magnesium: 2 mg/dL (ref 1.7–2.4)
Magnesium: 1.9 mg/dL (ref 1.7–2.4)

## 2021-09-12 MED ORDER — OSMOLITE 1.2 CAL PO LIQD
1000.0000 mL | ORAL | Status: DC
  Administered 2021-09-12 – 2021-09-13 (×3): 1000 mL

## 2021-09-12 MED ORDER — PHENOL 1.4 % MT LIQD: 1.0000 | OROMUCOSAL | Status: DC | PRN

## 2021-09-12 MED ORDER — BLISTEX MEDICATED EX OINT
TOPICAL_OINTMENT | CUTANEOUS | Status: DC | PRN
  Filled 2021-09-12: qty 6.3

## 2021-09-12 MED ORDER — INSULIN ASPART 100 UNIT/ML IJ SOLN
0.0000 [IU] | INTRAMUSCULAR | Status: DC
  Administered 2021-09-12: 3 [IU] via SUBCUTANEOUS
  Administered 2021-09-12: 7 [IU] via SUBCUTANEOUS
  Administered 2021-09-12 – 2021-09-13 (×4): 3 [IU] via SUBCUTANEOUS
  Administered 2021-09-13 (×2): 5 [IU] via SUBCUTANEOUS
  Administered 2021-09-13: 2 [IU] via SUBCUTANEOUS
  Administered 2021-09-14: 3 [IU] via SUBCUTANEOUS
  Administered 2021-09-14 (×2): 5 [IU] via SUBCUTANEOUS
  Administered 2021-09-14: 3 [IU] via SUBCUTANEOUS
  Filled 2021-09-12 (×11): qty 1

## 2021-09-12 MED ORDER — FREE WATER
80.0000 mL | Freq: Four times a day (QID) | Status: DC
  Administered 2021-09-12 – 2021-09-14 (×9): 80 mL

## 2021-09-12 MED ORDER — VITAL HIGH PROTEIN PO LIQD: 1000.0000 mL | ORAL | Status: DC

## 2021-09-12 NOTE — Progress Notes (Signed)
OT Cancellation Note ? ?Patient Details ?Name: Tiffany Bailey ?MRN: [id number] ?DOB: [birth year] ? ? ?Cancelled Treatment:    Reason Eval/Treat Not Completed: Medical issues which prohibited therapy. Chart reviewed, pt noted to have BGL 324, outside range for therapy services. Per chart pt also trialing tube feeds today, will reattempt as pt appropriate and able to participate.  ? ?Dessie Coma, M.S. OTR/L  ?09/12/21, 3:14 PM  ?ascom [phone] ? ?

## 2021-09-12 NOTE — Progress Notes (Signed)
Patient has D5W infusing at 39m/hr and Q4H accuchecks. Last BG 287. No coverage ordered. Provider notified. Will continue to monitor.  ?

## 2021-09-12 NOTE — Progress Notes (Signed)
?PROGRESS NOTE ? ? ? ?HPI was taken from Dr. Sheppard Coil: ?Tiffany Bailey is a 88 y.o. female with history of diabetes, CAD, hypertension, recent hospitalization for UTI, CHF, along other medical problems, see past medical history, presents emergency department with weakness.  Patient was recently admitted to the hospital for UTI and discharged 4 days ago.  Patient had an acute UTI and Foley have been placed.  Daughter states that she has been getting her up to go the bathroom frequently.  Has not noticed any smell to the urine.  Has had no fever that she knows of.  No chest pain or shortness of breath.  No cough or congestion.  No vomiting or diarrhea.  States that she has not been wanting to eat or drink for the last several days. ?In ED: ?Labs: ?Urinalysis/urine culture pending collection as of time of admission ?Creatinine elevated to 1.8 above baseline 1.3-1.4, GFR 26 decreased from baseline in 30s ?Anemia relatively stable, hemoglobin 9.0, was 7.8 on discharge 4 days ago ?White blood cells elevated to 25.4, was 11.6 on discharge from hospital 4 days ago.   ?Platelets 418 ?Hepatic function panel demonstrated sharp increase in AST/ALT, which were 150/70 respectively 9 days ago, are 759/477 respectively now ?Reduced total protein/albumin ?Lactic acid 1.6 ?Respiratory panel negative for COVID-19, influenza A/B ? ?Imaging:  ?Chest x-ray showed interval decrease in infiltrate in right perihilar region resolving atelectasis/pneumonia.  Increased haziness in right lower lung fields may be due to layering of small right pleural effusion ?RUQ ultrasound to work-up elevated AST/ALT was normal ?EKG no ST/T changes concerning for ischemia  ?Treatment: ?Antibiotics: Aztreonam, Vancomycin, metronidazole, cefepime ?IV fluids: Sodium chloride 0.9% bolus total 1500 mL, currently on lactated Ringer's infusion ?  ?Hospital course from Dr. Jimmye Norman 3/24-3/28/23: Pt has had multiple medical problems as stated below and especially  failure to thrive. Pt does not want to eat and/or drink much of anything. Pt's family requested that a feeding tube be placed but a NG tube was placed x 3 and each time the pt pulled out the NG tube. Pt told her son [name] and granddaughter at bedside on 09/10/21 that she did not want a feeding tube. Pt is alert and oriented and able to make decisions for herself. A 4th NG tube was placed on 09/11/21 late afternoon and pt started tube feeds today. Palliative care is following and recs apprec. See A&P below for more information ? ? ? ?Tiffany Bailey  CXK:[id number] DOB: [birth year] DOA: 08/18/2021 ?PCP: [name], FNP  ? ? ? ?Assessment & Plan: ?  ?Principal Problem: ?  Sepsis ([place]) ?Active Problems: ?  DM (diabetes mellitus) ([place]) ?  Essential hypertension ?  CAD (coronary artery disease), native coronary artery ?  GERD (gastroesophageal reflux disease) ?  PAD (peripheral artery disease) ([place]) ?  Hx of AKA (above knee amputation), left ([place]) ?  Anxiety and depression ?  Acute kidney injury superimposed on chronic kidney disease ([place]) ?  Metastatic breast cancer ([place]) ?  Hb-SS disease without crisis ([place]) ?  Hyperkalemia ?  Goals of care, counseling/discussion ?  Pleural effusion ?  UTI (urinary tract infection) ?  Physical deconditioning ?  Transaminitis ?  Failure to thrive in adult ?  Dehydration ?  Elevated liver enzymes ?  Protein-calorie malnutrition, severe ?  Palliative care encounter ?  Leucocytosis ? ?Failure to thrive in adult: w/ poor po intake as well. NG placed x3 and pulled out 3 times by pt. Pt stated  she did not want a feeding tube 09/10/21 but pt's son [name] would like for the pt to have a feeding tube despite this. A 4th NG tube was placed on 3/28 and tube feeds were started 3/29. Palliative care is following and recs apprec  ? ?Sepsis: met criteria w/ leukocytosis, tachypnea & likely UTI. Blood cxs no growth. Urine cx shows no growth. Repeat procal 0.85. Resolved ? ?Transaminitis: etiology unclear,  possibly shock liver. Hepatitis panel is neg. [place] of RUQ was normal. AST, ALT continues to trend down  ? ?Hypernatremia: resolved  ? ?Hyperkalemia: resolved  ?  ?AKI on CKD: baseline Cr/GFR is unknown, currently stage IIIb. Cr is labile. Likely secondary rhabdomyolysis  ? ?Possible post obstructive pneumonia: as per ID. Abxs were d/c by ID. Continue on bronchodilators & encourage incentive spirometry. Legionella is pending still. Strep is neg  ? ?Rhabdomyolysis:  significant. Etiology unclear. Trending down still  ?  ?UTI: urine cx shows no growth. UA was positive on admission and abxs started before cx was taken. Abxs were d/c as per ID  ? ?Leukocytosis: significant & labile. Etiology unclear, infection vs blood dyscrasia vs medication ADRs.  Onco & ID following  ? ?Thrombocytosis: resolved  ? ?DM2: HbA1c 8.5, poorly controlled. Continue on SSI w/ accuchecks  ? ?Metastatic breast cancer: not candidate for aggressive treatment as per onco. Onco following and rec apprec ?  ?Pleural effusion: small as per XR. Not requiring supplemental oxygen  ? ?Severe protein calorie malnutrition: pt is refusing nutritional supplements. NG tube placed x 3 and pulled out 3 times by pt. Pt stated this that she did not want a feeding tube on 09/10/21. A 4th NG tube was placed on 3/28 and pt was started on tube feeds 3/29 ? ? ?DVT prophylaxis: heparin ?Code Status: DNR ?Family Communication: discussed pt's care w/ pt's family at bedside and answered their questions  ?Disposition Plan:  unclear ? ?Level of care: Progressive ? ?Status is: Inpatient ?Remains inpatient appropriate because: severity of illness, see all above  ? ? ? ?Consultants:  ?Palliative care ? ?Procedures:  ? ?Antimicrobials:  ? ? ?Subjective: ?Pt c/o sore throat   ? ?Objective: ?Vitals:  ? 09/12/21 0501 09/12/21 0734 09/12/21 1149 09/12/21 1150  ?BP: (!) 150/69 (!) 158/64  (!) 159/63  ?Pulse: (!) 109 (!) 108  (!) 109  ?Resp: 18 18  18   ?Temp: 98.3 ?F (36.8 ?C) 99.3 ?F  (37.4 ?C) 99.6 ?F (37.6 ?C) 99.6 ?F (37.6 ?C)  ?TempSrc: Oral  Oral   ?SpO2: 99% 100%  98%  ?Weight:      ?Height:      ? ? ?Intake/Output Summary (Last 24 hours) at 09/12/2021 1415 ?Last data filed at 09/12/2021 1000 ?Gross per 24 hour  ?Intake 1526.93 ml  ?Output 45 ml  ?Net 1481.93 ml  ? ?Filed Weights  ? 09/10/21 0500 09/11/21 0500 09/12/21 0500  ?Weight: 43.1 kg 41.7 kg 43.3 kg  ? ? ?Examination: ? ?General exam: Appears calm but uncomfortable. Frail appearing  ?Respiratory system: diminished breath sounds b/l  ?Cardiovascular system: S1 & S2+. No rubs or gallops  ?Gastrointestinal system: Abd is soft, NT, ND & hypoactive bowel sounds ?Central nervous system: alert and oriented. Moves all extremities  ?Psychiatry: judgement and insight appears at baseline. Flat mood and affect ? ? ? ?Data Reviewed: I have personally reviewed following labs and imaging studies ? ?CBC: ?Recent Labs  ?Lab 09/09/21 ?1443 09/09/21 ?1244 09/10/21 ?1540 09/11/21 ?0867 09/12/21 ?6195  ?WBC 36.1*  36.4* 31.9* 29.9* 32.2*  ?NEUTROABS  --  31.9*  --   --   --   ?HGB 8.2* 8.3* 7.9* 8.2* 8.7*  ?HCT 24.8* 25.7* 25.1* 25.0* 26.7*  ?MCV 95.0 98.8 99.6 95.8 98.5  ?PLT 437* 433* 375 372 379  ? ?Basic Metabolic Panel: ?Recent Labs  ?Lab 08/28/2021 ?1955 09/07/21 ?0441 09/08/21 ?0355 09/08/21 ?1438 09/09/21 ?0505 09/10/21 ?9957 09/11/21 ?9009 09/12/21 ?2004 09/12/21 ?1128  ?NA 136   < > 138  --  142 143 146* 139  --   ?K 5.3*   < > 5.5* 5.3* 4.7 4.6 4.3 4.1  --   ?CL 110   < > 110  --  110 112* 114* 109  --   ?CO2 15*   < > 17*  --  20* 20* 23 22  --   ?GLUCOSE 163*   < > 208*  --  267* 260* 177* 323*  --   ?BUN 54*   < > 57*  --  63* 60* 61* 56*  --   ?CREATININE 1.53*   < > 1.40*  --  1.48* 1.34* 1.41* 1.52*  --   ?CALCIUM 9.1   < > 9.1  --  9.2 9.0 9.3 9.2  --   ?MG  --   --   --   --   --   --   --   --  2.0  ?PHOS 4.5  --   --   --   --   --   --   --  2.4*  ? < > = values in this interval not displayed.  ? ?GFR: ?Estimated Creatinine Clearance:  16.5 mL/min (A) (by C-G formula based on SCr of 1.52 mg/dL (H)). ?Liver Function Tests: ?Recent Labs  ?Lab 09/08/21 ?0355 09/09/21 ?1593 09/10/21 ?0123 09/11/21 ?7990 09/12/21 ?9400  ?AST 672* 591* 439* 3

## 2021-09-12 NOTE — Progress Notes (Signed)
? ?Date of Admission:  08/18/2021    ? ?ID: Tiffany Bailey is a 88 y.o. female    ?Principal Problem: ?  Sepsis ([place]) ?Active Problems: ?  DM (diabetes mellitus) ([place]) ?  Essential hypertension ?  CAD (coronary artery disease), native coronary artery ?  GERD (gastroesophageal reflux disease) ?  PAD (peripheral artery disease) ([place]) ?  Hx of AKA (above knee amputation), left ([place]) ?  Anxiety and depression ?  Acute kidney injury superimposed on chronic kidney disease ([place]) ?  Metastatic breast cancer ([place]) ?  Hb-SS disease without crisis ([place]) ?  Hyperkalemia ?  Goals of care, counseling/discussion ?  Pleural effusion ?  UTI (urinary tract infection) ?  Physical deconditioning ?  Transaminitis ?  Failure to thrive in adult ?  Dehydration ?  Elevated liver enzymes ?  Protein-calorie malnutrition, severe ?  Palliative care encounter ?  Leucocytosis ? ? ? ?Subjective: ?Getting NG tube feeds ?Opens eyes to calling her name and responds to simple questions ? ? ?Medications:  ? Chlorhexidine Gluconate Cloth  6 each Topical Daily  ? dronabinol  2.5 mg Oral QAC lunch  ? feeding supplement  237 mL Oral BID BM  ? free water  80 mL Per Tube Q6H  ? heparin  5,000 Units Subcutaneous Q8H  ? insulin aspart  0-9 Units Subcutaneous Q4H  ? multivitamin with minerals  1 tablet Oral Daily  ? ? ?Objective: ?Vital signs in last 24 hours: ?Temp:  [97.9 ?F (36.6 ?C)-99.6 ?F (37.6 ?C)] 99.6 ?F (37.6 ?C) (03/29 1150) ?Pulse Rate:  [101-117] 109 (03/29 1150) ?Resp:  [18] 18 (03/29 1150) ?BP: (144-169)/(63-69) 159/63 (03/29 1150) ?SpO2:  [97 %-100 %] 98 % (03/29 1150) ?Weight:  [43.3 kg] 43.3 kg (03/29 0500) ? ?PHYSICAL EXAM:  ?General: somnolent, weak ?Lungs: b/l air entry- decreased bases ?Heart: Regular rate and rhythm, no murmur, rub or gallop. ?Abdomen: Soft, non-tender,not distended. Bowel sounds normal. No masses ?Extremities: atraumatic, no cyanosis. No edema. No clubbing ?Skin: No rashes or lesions. Or bruising ?Lymph: Cervical,  supraclavicular normal. ?Neurologic: cannot assess ? ?Lab Results ?Recent Labs  ?  09/11/21 ?0653 09/12/21 ?0621  ?WBC 29.9* 32.2*  ?HGB 8.2* 8.7*  ?HCT 25.0* 26.7*  ?NA 146* 139  ?K 4.3 4.1  ?CL 114* 109  ?CO2 23 22  ?BUN 61* 56*  ?CREATININE 1.41* 1.52*  ? ?Liver Panel ?Recent Labs  ?  09/11/21 ?0653 09/12/21 ?0621  ?PROT 5.0* 5.2*  ?ALBUMIN 2.3* 2.4*  ?AST 327* 308*  ?ALT 554* 523*  ?ALKPHOS 84 91  ?BILITOT 0.7 0.7  ? ?Sedimentation Rate ?No results for input(s): ESRSEDRATE in the last 72 hours. ?C-Reactive Protein ?No results for input(s): CRP in the last 72 hours. ? ?Microbiology: ? ?Studies/Results: ?CT CHEST WO CONTRAST ? ?Result Date: 09/10/2021 ?CLINICAL DATA:  A 88 year old female presents for evaluation of abnormal chest findings. EXAM: CT CHEST WITHOUT CONTRAST TECHNIQUE: Multidetector CT imaging of the chest was performed following the standard protocol without IV contrast. RADIATION DOSE REDUCTION: This exam was performed according to the departmental dose-optimization program which includes automated exposure control, adjustment of the mA and/or kV according to patient size and/or use of iterative reconstruction technique. COMPARISON:  July 06, 2021. FINDINGS: Cardiovascular: Calcified atheromatous plaque of the thoracic aorta. No aneurysmal dilation. Heart size normal without pericardial effusion. Marked calcified coronary artery disease as before. Central pulmonary vasculature is normal caliber. Mediastinum/Nodes: Thoracic inlet structures are normal. No signs of axillary lymphadenopathy. Mediastinal adenopathy with similar  distribution, (image 60/2) RIGHT paratracheal lymph node at 18 mm previously 24 mm short axis. Peripherally calcified subcarinal lymph node at 22 mm short axis, similar size when measured by this observer on the prior exam. No gross hilar adenopathy. No new signs of nodal enlargement. Mildly patulous esophagus. Lungs/Pleura: (Image 20/3) 6 mm LEFT upper lobe pulmonary nodule.  (Image 76/3) stable 13 x 9 mm LEFT upper lobe ground-glass nodule. Other LEFT upper lobe area of ground-glass is unchanged. RIGHT upper lobe ground-glass/subsolid nodule measuring 14 x 10 mm previously 17 x 11 mm. Peri fissural nodules in the RIGHT upper lobe are also stable the largest at 6 mm. (Image 54/3) Small nodules in the RIGHT middle lobe ir also unchanged as is a Peri fissural nodule in the accessory fissure in the RIGHT lower lobe. Juxta diaphragmatic nodule along the pleural surface in the RIGHT lower lobe measuring up to 14 mm greatest axial dimension is stable (image 122/3) Interval development of moderate RIGHT and small LEFT pleural effusions with basilar collapse. Airways are patent. Upper Abdomen: No acute findings in the upper abdomen. No upper abdominal lymphadenopathy. Musculoskeletal: No acute bone finding. No destructive bone process. Spinal degenerative changes. IMPRESSION: 1. Interval development of moderate RIGHT and small LEFT pleural effusions with basilar collapse. 2. Ground-glass and subsolid as well as solid nodules in the LEFT and RIGHT chest are stable. 3. Pleural based and or pleural-parenchymal nodule over the RIGHT hemidiaphragm also stable. 4. Diminished size of RIGHT paratracheal lymph node and stable appearance of subcarinal nodal tissue. 5. Stable appearance of mediastinal adenopathy. 6. Marked calcified coronary artery disease. Aortic Atherosclerosis (ICD10-I70.0). Electronically Signed   By: Zetta Bills M.D.   On: 09/10/2021 18:20  ? ?DG Chest Port 1 View ? ?Result Date: 09/11/2021 ?CLINICAL DATA:  Status post Dobbhoff placement EXAM: PORTABLE CHEST 1 VIEW COMPARISON:  09/09/2021 FINDINGS: Unchanged position of a weighted enteric tube with tip overlying the stomach. Normal cardiac and mediastinal contours. Aortic atherosclerosis. Status post median sternotomy. Moderate right layering pleural effusion with associated atelectasis. Trace left pleural effusion. IMPRESSION: 1.  Weighted enteric tube terminates in the stomach. 2. Moderate right and trace left pleural effusion. Electronically Signed   By: Merilyn Baba M.D.   On: 09/11/2021 17:34   ? ? ?Assessment/Plan: ?Metastatic ca breast with lung nodules on the rt side ?  ?Weakness- secondary to poor intake- rhabdo may be playing a role ?  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 ?Transaminitis ( normal bili and normal alk phos) could be due to rhabdo  or secondary to liver injury from medication ?Improving ?  ?Leucocytosis- reactive vs inflammatory VS infection ?Urine culture, blood culture neg ?Could it be due to pleural effusion?  ?Pleurocentesis with culture, cell count and chem may help . As no resp symptoms,  continue to observe off antibiotics ? ?  ?Rt lung infiltrates and pleural effusion- malignancy VS post obstructive pneumonia VS aspiration VS HAP ?Pt is not symptomatic ?Had received 4 days of azithromycin and 4 days of vanco/cefepime- MRSA nares neg- DC vanco and cefepime on 09/10/21- and  azithromycin 09/11/21 ?Will observe off antibiotics ?   ?AKI ?  ?Incomplete urinary emptying- chronic constipation contributing to it ?  ?  ?Sarcopenia and frailty of old age ? ?Poor intake-has started NG tube feeds ? ?Discussed with care team ?  ?

## 2021-09-12 NOTE — Progress Notes (Signed)
?   09/12/21 2300  ?Clinical Encounter Type  ?Visited With Patient and family together  ?Visit Type Initial  ?Referral From Nurse  ?Consult/Referral To Chaplain  ? ?Chaplain responded to nurse consult. Patient was sleeping. Chaplain provided compassionate presence and reflective listening as family member talked about hospital stay. Family appreciated Chaplain visit. ?

## 2021-09-12 NOTE — Progress Notes (Addendum)
Nutrition Follow-up ? ?DOCUMENTATION CODES:  ? ?Severe malnutrition in context of chronic illness ? ?INTERVENTION:  ? ?-D/c Prosource Plus ?-Continue Ensure Enlive po BID, each supplement provides 350 kcal and 20 grams of protein ?-Continue regular diet ?-Continue Hormel Shake TID with meals, each supplement provides 520 kcals and 22 grams protein ? ?-Initiate Osmolite 1.2 @ 20 ml/hr via NGT and increase by 10 ml every 12 hours to goal rate of 50 ml/hr.  ? ?80 ml free water flush every 6 hours ? ?Tube feeding regimen provides 1440 kcal (100% of needs), 67 grams of protein, and 984 ml of H2O. Total free water: 1304 ml daily ? ?-MVI with minerals via tube ? ?-Monitor Mg, K, and Phos daily and replete as needed secondary to high refeeding risk  ? ?NUTRITION DIAGNOSIS:  ? ?Severe Malnutrition related to chronic illness (UTI, CHF, FTT, metastatic breast CA) as evidenced by energy intake < or equal to 75% for > or equal to 1 month, severe muscle depletion, moderate fat depletion. ? ?Ongoing ? ?GOAL:  ? ?Patient will meet greater than or equal to 90% of their needs ? ?Progressing  ? ?MONITOR:  ? ?PO intake, Supplement acceptance, Diet advancement, Labs, Weight trends ? ?REASON FOR ASSESSMENT:  ? ?Malnutrition Screening Tool ?  ? ?ASSESSMENT:  ? ?Pt admitted with sepsis. PMH significant for diabetes, CAD, HTN, recent hospitalization for UTI and d/c 4 days ago, CHF, failure to thrive, metastatic breast cancer. ? ?3/26- pt pulled out NGT after placement ? ?Reviewed I/O's: +1.4 L x 24 hours and +3.1 L since admission ? ?UOP: 45 ml x 24 hours ? ?Pt with very minimal intake. Noted meal completions 0%. Per RN notes, pt often refusing food, but will sometimes take bites and sips, however, holding food in her mouth. Pt took half of MVI in applesauce this morning and refused the rest.  ? ?Palliative care consulted for goals of care discussions. Family having difficulty coming to consensus for goals of care.  ? ?NGT placed today at  bedside per RN. Plan to start TF today. Will advance slowly due to high refeeding risk. Case discussed with RN and nursing student.  ?  ?Medications reviewed and include dextrose 5% solution @ 50 ml/hr.  ? ?Labs reviewed: CBGS: [id number] (inpatient orders for glycemic control are 0-9 units insulin aspart every 4 hours).   ? ?Diet Order:   ?Diet Order   ? ?       ?  Diet regular Room service appropriate? Yes; Fluid consistency: Thin  Diet effective now       ?  ? ?  ?  ? ?  ? ? ?EDUCATION NEEDS:  ? ?Education needs have been addressed ? ?Skin:  Skin Assessment: Skin Integrity Issues: ?Skin Integrity Issues:: Stage II ?Stage II: buttocks and sacrum ? ?Last BM:  09/07/21 ? ?Height:  ? ?Ht Readings from Last 1 Encounters:  ?08/15/2021 5' (1.524 m)  ? ? ?Weight:  ? ?Wt Readings from Last 1 Encounters:  ?09/12/21 43.3 kg  ? ?BMI:  Body mass index is 18.64 kg/m?. ? ?Estimated Nutritional Needs:  ? ?Kcal:  1300-1500 ? ?Protein:  65-80g ? ?Fluid:  >/=1.5L ? ? ? ?Loistine Chance, RD, LDN, CDCES ?Registered Dietitian II ?Certified Diabetes Care and Education Specialist ?Please refer to [facility] for RD and/or RD on-call/weekend/after hours pager  ?

## 2021-09-12 NOTE — Progress Notes (Signed)
Inpatient Diabetes Program Recommendations ? ?AACE/ADA: New Consensus Statement on Inpatient Glycemic Control  ? ?Target Ranges:  Prepandial:   less than 140 mg/dL ?     Peak postprandial:   less than 180 mg/dL (1-2 hours) ?     Critically ill patients:  140 - 180 mg/dL  ? ? Latest Reference Range & Units 09/12/21 04:06 09/12/21 07:37  ?Glucose-Capillary 70 - 99 mg/dL 295 (H) 284 (H)  ? ? Latest Reference Range & Units 09/11/21 07:53 09/11/21 11:52 09/11/21 17:19 09/11/21 20:35 09/11/21 23:07  ?Glucose-Capillary 70 - 99 mg/dL 161 (H) 193 (H) 224 (H) 257 (H) 287 (H)  ? ?Review of Glycemic Control ? ?Diabetes history: DM2 ?Outpatient Diabetes medications: None ?Current orders for Inpatient glycemic control: CBGs ? ?Inpatient Diabetes Program Recommendations:   ? ?Insulin: If appropriate for patient, please consider ordering Novolog 0-9 units Q4H. ? ?Thanks, ?Barnie Alderman, RN, MSN, CDE ?Diabetes Coordinator ?Inpatient Diabetes Program ?[phone] (Team Pager from 8am to 5pm) ? ? ? ?

## 2021-09-13 ENCOUNTER — Inpatient Hospital Stay: Payer: Medicare Other

## 2021-09-13 ENCOUNTER — Other Ambulatory Visit: Payer: Medicare Other | Admitting: Primary Care

## 2021-09-13 DIAGNOSIS — E860 Dehydration: Secondary | ICD-10-CM | POA: Diagnosis not present

## 2021-09-13 DIAGNOSIS — K7200 Acute and subacute hepatic failure without coma: Secondary | ICD-10-CM | POA: Diagnosis not present

## 2021-09-13 DIAGNOSIS — N179 Acute kidney failure, unspecified: Secondary | ICD-10-CM | POA: Diagnosis not present

## 2021-09-13 DIAGNOSIS — R6520 Severe sepsis without septic shock: Secondary | ICD-10-CM | POA: Diagnosis not present

## 2021-09-13 DIAGNOSIS — Z789 Other specified health status: Secondary | ICD-10-CM

## 2021-09-13 DIAGNOSIS — R627 Adult failure to thrive: Secondary | ICD-10-CM | POA: Diagnosis not present

## 2021-09-13 DIAGNOSIS — C50919 Malignant neoplasm of unspecified site of unspecified female breast: Secondary | ICD-10-CM | POA: Diagnosis not present

## 2021-09-13 DIAGNOSIS — K5900 Constipation, unspecified: Secondary | ICD-10-CM | POA: Diagnosis not present

## 2021-09-13 DIAGNOSIS — A419 Sepsis, unspecified organism: Secondary | ICD-10-CM | POA: Diagnosis not present

## 2021-09-13 DIAGNOSIS — R531 Weakness: Secondary | ICD-10-CM | POA: Diagnosis not present

## 2021-09-13 LAB — CBC
HCT: 27.6 % — ABNORMAL LOW (ref 36.0–46.0)
Hemoglobin: 9 g/dL — ABNORMAL LOW (ref 12.0–15.0)
MCH: 31.6 pg (ref 26.0–34.0)
MCHC: 32.6 g/dL (ref 30.0–36.0)
MCV: 96.8 fL (ref 80.0–100.0)
Platelets: 417 10*3/uL — ABNORMAL HIGH (ref 150–400)
RBC: 2.85 MIL/uL — ABNORMAL LOW (ref 3.87–5.11)
RDW: 15.9 % — ABNORMAL HIGH (ref 11.5–15.5)
WBC: 37.3 10*3/uL — ABNORMAL HIGH (ref 4.0–10.5)
nRBC: 0.3 % — ABNORMAL HIGH (ref 0.0–0.2)

## 2021-09-13 LAB — BASIC METABOLIC PANEL
Anion gap: 7 (ref 5–15)
BUN: 59 mg/dL — ABNORMAL HIGH (ref 8–23)
CO2: 22 mmol/L (ref 22–32)
Calcium: 9.3 mg/dL (ref 8.9–10.3)
Chloride: 109 mmol/L (ref 98–111)
Creatinine, Ser: 1.6 mg/dL — ABNORMAL HIGH (ref 0.44–1.00)
GFR, Estimated: 30 mL/min — ABNORMAL LOW (ref >60)
Glucose, Bld: 186 mg/dL — ABNORMAL HIGH (ref 70–99)
Potassium: 4.3 mmol/L (ref 3.5–5.1)
Sodium: 138 mmol/L (ref 135–145)

## 2021-09-13 LAB — GLUCOSE, CAPILLARY
Glucose-Capillary: 157 mg/dL — ABNORMAL HIGH (ref 70–99)
Glucose-Capillary: 268 mg/dL — ABNORMAL HIGH (ref 70–99)
Glucose-Capillary: 283 mg/dL — ABNORMAL HIGH (ref 70–99)
Glucose-Capillary: 204 mg/dL — ABNORMAL HIGH (ref 70–99)
Glucose-Capillary: 246 mg/dL — ABNORMAL HIGH (ref 70–99)

## 2021-09-13 LAB — PHOSPHORUS
Phosphorus: 2.7 mg/dL (ref 2.5–4.6)
Phosphorus: 2.8 mg/dL (ref 2.5–4.6)

## 2021-09-13 LAB — PROCALCITONIN: Procalcitonin: 0.8 ng/mL

## 2021-09-13 LAB — MAGNESIUM
Magnesium: 2.1 mg/dL (ref 1.7–2.4)
Magnesium: 2.4 mg/dL (ref 1.7–2.4)

## 2021-09-13 MED ORDER — SODIUM CHLORIDE 0.9 % IV SOLN
1.0000 g | INTRAVENOUS | Status: DC
  Filled 2021-09-13: qty 10

## 2021-09-13 MED ORDER — ADULT MULTIVITAMIN LIQUID CH
15.0000 mL | Freq: Every day | ORAL | Status: DC
  Administered 2021-09-13: 15 mL
  Filled 2021-09-13 (×2): qty 15

## 2021-09-13 MED ORDER — SIMETHICONE 80 MG PO CHEW
80.0000 mg | CHEWABLE_TABLET | Freq: Four times a day (QID) | ORAL | Status: DC | PRN
  Administered 2021-09-13: 80 mg via ORAL
  Filled 2021-09-13: qty 1

## 2021-09-13 MED ORDER — METRONIDAZOLE 500 MG/100ML IV SOLN
500.0000 mg | Freq: Two times a day (BID) | INTRAVENOUS | Status: DC
  Administered 2021-09-13 – 2021-09-14 (×2): 500 mg via INTRAVENOUS
  Filled 2021-09-13 (×4): qty 100

## 2021-09-13 MED ORDER — SODIUM CHLORIDE 0.9 % IV SOLN
1.0000 g | INTRAVENOUS | Status: DC
  Administered 2021-09-13: 1 g via INTRAVENOUS
  Filled 2021-09-13 (×2): qty 10

## 2021-09-13 MED ORDER — MILK AND MOLASSES ENEMA
1.0000 | Freq: Once | RECTAL | Status: AC
  Administered 2021-09-13: 240 mL via RECTAL
  Filled 2021-09-13: qty 240

## 2021-09-13 MED ORDER — DOCUSATE SODIUM 50 MG/5ML PO LIQD
100.0000 mg | Freq: Two times a day (BID) | ORAL | Status: DC
  Administered 2021-09-13 – 2021-09-14 (×2): 100 mg
  Filled 2021-09-13 (×4): qty 10

## 2021-09-13 MED ORDER — SIMETHICONE 40 MG/0.6ML PO SUSP
80.0000 mg | Freq: Four times a day (QID) | ORAL | Status: DC | PRN
  Administered 2021-09-13: 80 mg
  Filled 2021-09-13: qty 1.2
  Filled 2021-09-13: qty 30
  Filled 2021-09-13: qty 1.2

## 2021-09-13 NOTE — Progress Notes (Signed)
[facility]) Hospital Liaison note: ? ?This patient is currently enrolled in [facility] outpatient-based Palliative Care. Will continue to follow for disposition. ? ?Please call with any outpatient palliative questions or concerns. ? ?Thank you, ?Lorelee Market, LPN ?[facility] Hospital Liaison ?[phone] ?

## 2021-09-13 NOTE — Progress Notes (Signed)
?PROGRESS NOTE ? ? ? ?Tiffany Bailey is a 88 y.o. female with history of diabetes, CAD, hypertension, recent hospitalization for UTI, CHF, along other medical problems, see past medical history, presents emergency department with weakness.  Patient was recently admitted to the hospital for UTI and discharged 4 days ago.  Patient had an acute UTI and Foley have been placed.  In Tiffany Bailey admitted with a diagnosis of possible sepsis due to possible UTI and pneumonia, transaminitis, hyperkalemia, acute kidney injury complicating CKD, and rhabdomyolysis ? ?Hospital course from Dr. Jimmye Norman 3/24-3/28/23: Pt has had multiple medical problems as stated below and especially failure to thrive. Pt does not want to eat and/or drink much of anything. Pt's family requested that a feeding tube be placed but a NG tube was placed x 3 and each time the pt pulled out the NG tube. Pt told her son [name] and granddaughter at bedside on 09/10/21 that she did not want a feeding tube. Pt is alert and oriented and able to make decisions for herself. A 4th NG tube was placed on 09/11/21 late afternoon and pt started tube feeds. Palliative care is following and recs apprec. See A&P below for more information.  Currently on tube feeds at 30 mL/h with goal of 50 mL/h. ? ? ? ?Tiffany Bailey  [id number]:[id number] DOB: [birth year] DOA: 09/04/2021 ?PCP: [name], FNP  ? ? ?Assessment & Plan: ?  ?Principal Problem: ?  Sepsis ([place]) ?Active Problems: ?  DM (diabetes mellitus) ([place]) ?  Essential hypertension ?  CAD (coronary artery disease), native coronary artery ?  GERD (gastroesophageal reflux disease) ?  PAD (peripheral artery disease) ([place]) ?  Hx of AKA (above knee amputation), left ([place]) ?  Anxiety and depression ?  Acute kidney injury superimposed on chronic kidney disease ([place]) ?  Metastatic breast cancer ([place]) ?  Hb-SS disease without crisis ([place]) ?  Hyperkalemia ?  Goals of care, counseling/discussion ?  Pleural effusion ?  UTI (urinary tract  infection) ?  Physical deconditioning ?  Transaminitis ?  Failure to thrive in adult ?  Dehydration ?  Elevated liver enzymes ?  Protein-calorie malnutrition, severe ?  Palliative care encounter ?  Leucocytosis ? ?Failure to thrive in adult: w/ poor po intake as well. NG placed x3 and pulled out 3 times by pt. Pt stated she did not want a feeding tube 09/10/21 but pt's son [name] would like for the pt to have a feeding tube despite this. A 4th NG tube was placed on 3/28 and tube feeds were started 3/29 and currently going at 30 mL/h with plan to increase to goal at 50 mL/h. Palliative care is following and recs apprec: As per input on 3/27: Continuing current scope of treatment and palliative continues to discuss with family regarding goals. ? ?Constipation: Patient reported some bloating and abdominal discomfort.  Obtained KUB which shows NG tube in appropriate position but also shows prominent gas-filled loops of colon with slightly increased gaseous distention compared to prior radiograph and moderate stool burden.  We will give an enema x1 and monitor clinically. ? ?Sepsis: met criteria w/ leukocytosis, tachypnea & likely UTI. Blood cxs no growth. Urine cx shows no growth. Repeat procal 0.85. Resolved ? ?Transaminitis: etiology unclear, possibly shock liver. Hepatitis panel is neg. [place] of RUQ was normal. AST, ALT continues to trend down.  Recheck labs in AM. ? ?Hypernatremia: resolved  ? ?Hyperkalemia: resolved  ?  ?AKI on CKD: baseline Cr/GFR is unknown, currently stage  IIIb. Cr is labile. Likely secondary rhabdomyolysis.  Creatinine has gradually increased over the last 4 days from 1.3-1.6.  May be related to poor oral intake.  Now that the tube feedings are gradually increased to goal, follow BMP in a.m. ? ?Possible post obstructive pneumonia: as per ID. Abxs were d/c by ID. Continue on bronchodilators & encourage incentive spirometry. Legionella urine antigen negative. ? ?Rhabdomyolysis:  significant. Etiology  unclear.  Peak of 28,709 on 3/23, down to around 5K on 3/29.  Follow CK in a.m. per ID, some question of Arimidex induced myositis, paraneoplastic myositis but doubt viral/infectious etiology. ?  ?UTI: urine cx shows no growth. UA was positive on admission and abxs started before cx was taken. Abxs were d/c as per ID  ? ?Leukocytosis: significant & labile. Etiology unclear, infection vs blood dyscrasia vs medication ADRs.  Onco & ID following.  Has been up >30 K for the last several days. ? ?Thrombocytosis: resolved  ? ?DM2: HbA1c 8.5, poorly controlled. Continue on SSI w/ accuchecks  ? ?Metastatic breast cancer: not candidate for aggressive treatment as per onco. Onco following and rec apprec ?  ?Pleural effusion: small as per XR. Not requiring supplemental oxygen  ? ?Severe protein calorie malnutrition: pt is refusing nutritional supplements. NG tube placed x 3 and pulled out 3 times by pt. Pt stated this that she did not want a feeding tube on 09/10/21. A 4th NG tube was placed on 3/28 and pt was started on tube feeds 3/29 ? ?Anemia, possibly of chronic disease: Stable. ? ? ?DVT prophylaxis: heparin ?Code Status: DNR ?Family Communication: discussed pt's care w/ pt's son and a family friend at bedside and answered their questions  ?Disposition Plan:  unclear ? ?Level of care: Progressive ? ?Status is: Inpatient ?Remains inpatient appropriate because: severity of illness, see all above  ? ? ? ?Consultants:  ?Palliative care ?Hematology/oncology ?Infectious disease ? ?Procedures:  ? ?Antimicrobials:  ?As noted above. ? ?Subjective: ?Patient reports some abdominal gaseous distention and discomfort.  Feels bloated.  Last BM 3/29.  No nausea or vomiting.  As per RN, tube feeds at 30 mL/h with plans to increase later today to 40 mL/h. ? ?Objective: ?Vitals:  ? 09/13/21 0354 09/13/21 0731 09/13/21 1140 09/13/21 1353  ?BP: (!) 151/69 (!) 150/65 (!) 141/59 138/82  ?Pulse: (!) 103 (!) 111  (!) 123  ?Resp: 18 18 (!) 30 (!)  28  ?Temp: 97.8 ?F (36.6 ?C) 98 ?F (36.7 ?C) 97.8 ?F (36.6 ?C)   ?TempSrc:  Oral Oral   ?SpO2: 95% 99% 100% 100%  ?Weight:      ?Height:      ? ? ?Intake/Output Summary (Last 24 hours) at 09/13/2021 1406 ?Last data filed at 09/13/2021 0400 ?Gross per 24 hour  ?Intake 426 ml  ?Output 950 ml  ?Net -524 ml  ? ? ?Filed Weights  ? 09/10/21 0500 09/11/21 0500 09/12/21 0500  ?Weight: 43.1 kg 41.7 kg 43.3 kg  ? ? ?Examination: ? ?General exam: Elderly female, small build, frail, chronically ill looking lying propped up in bed.  NG tube in place with tube feeds ongoing. ?Respiratory system: Clear to auscultation. Respiratory effort normal. ?Cardiovascular system: S1 & S2 heard, regular tachycardia. No JVD, murmurs, rubs, gallops or clicks. No pedal edema.  Telemetry personally reviewed: SR-ST in the 120s with BBB morphology. ?Gastrointestinal system: Abdomen is nondistended, soft and nontender. No organomegaly or masses felt. Normal bowel sounds heard. ?Central nervous system: Alert and oriented x2. No focal  neurological deficits. ?Extremities: Symmetric 5 x 5 power. ?Skin: No rashes, lesions or ulcers ?Psychiatry: Judgement and insight appear normal. Mood & affect appropriate.  ? ? ? ? ?Data Reviewed: I have personally reviewed following labs and imaging studies ? ?CBC: ?Recent Labs  ?Lab 09/09/21 ?1244 09/10/21 ?0351 09/11/21 ?0931 09/12/21 ?1216 09/13/21 ?0448  ?WBC 36.4* 31.9* 29.9* 32.2* 37.3*  ?NEUTROABS 31.9*  --   --   --   --   ?HGB 8.3* 7.9* 8.2* 8.7* 9.0*  ?HCT 25.7* 25.1* 25.0* 26.7* 27.6*  ?MCV 98.8 99.6 95.8 98.5 96.8  ?PLT 433* 375 372 379 417*  ? ? ?Basic Metabolic Panel: ?Recent Labs  ?Lab 08/28/2021 ?1955 09/07/21 ?0441 09/09/21 ?0505 09/10/21 ?2446 09/11/21 ?9507 09/12/21 ?2257 09/12/21 ?1128 09/12/21 ?1707 09/13/21 ?0448  ?NA 136   < > 142 143 146* 139  --   --  138  ?K 5.3*   < > 4.7 4.6 4.3 4.1  --   --  4.3  ?CL 110   < > 110 112* 114* 109  --   --  109  ?CO2 15*   < > 20* 20* 23 22  --   --  22  ?GLUCOSE  163*   < > 267* 260* 177* 323*  --   --  186*  ?BUN 54*   < > 63* 60* 61* 56*  --   --  59*  ?CREATININE 1.53*   < > 1.48* 1.34* 1.41* 1.52*  --   --  1.60*  ?CALCIUM 9.1   < > 9.2 9.0 9.3 9.2  --   --  9.3

## 2021-09-13 NOTE — Progress Notes (Signed)
? ?Date of Admission:  08/22/2021    ? ?ID: Tiffany Bailey is a 88 y.o. female    ?Principal Problem: ?  Sepsis ([place]) ?Active Problems: ?  DM (diabetes mellitus) ([place]) ?  Essential hypertension ?  CAD (coronary artery disease), native coronary artery ?  GERD (gastroesophageal reflux disease) ?  PAD (peripheral artery disease) ([place]) ?  Hx of AKA (above knee amputation), left ([place]) ?  Anxiety and depression ?  Acute kidney injury superimposed on chronic kidney disease ([place]) ?  Metastatic breast cancer ([place]) ?  Hb-SS disease without crisis ([place]) ?  Hyperkalemia ?  Goals of care, counseling/discussion ?  Pleural effusion ?  UTI (urinary tract infection) ?  Physical deconditioning ?  Transaminitis ?  Failure to thrive in adult ?  Dehydration ?  Elevated liver enzymes ?  Protein-calorie malnutrition, severe ?  Palliative care encounter ?  Leucocytosis ? ? ? ?Subjective: ?Pt has had red mews today ?SOB ?Tachycardia ?Abdominal pain ?Xray abdomen showed stool burden with dilated loops ? ? ? ?Medications:  ? Chlorhexidine Gluconate Cloth  6 each Topical Daily  ? docusate  100 mg Per Tube BID  ? dronabinol  2.5 mg Oral QAC lunch  ? feeding supplement  237 mL Oral BID BM  ? free water  80 mL Per Tube Q6H  ? heparin  5,000 Units Subcutaneous Q8H  ? insulin aspart  0-9 Units Subcutaneous Q4H  ? milk and molasses  1 enema Rectal Once  ? multivitamin  15 mL Per Tube Daily  ? ? ?Objective: ?Vital signs in last 24 hours: ?Temp:  [97.8 ?F (36.6 ?C)-98.1 ?F (36.7 ?C)] 97.8 ?F (36.6 ?C) (03/30 1140) ?Pulse Rate:  [94-123] 123 (03/30 1353) ?Resp:  [16-30] 28 (03/30 1353) ?BP: (90-186)/(36-82) 138/82 (03/30 1353) ?SpO2:  [95 %-100 %] 100 % (03/30 1353) ? ?PHYSICAL EXAM:  ?General: somnolent , opens eyes to calling but very weak ?Lungs: b/l air entry- decreased bases ?Heart: tachycardia ?NG tube ?Abdomen: Soft, distended. Bowel sounds normal. No masses ?Extremities: atraumatic, no cyanosis. No edema. No clubbing ?Skin: No rashes or  lesions. Or bruising ?Lymph: Cervical, supraclavicular normal. ?Neurologic: cannot assess ? ?Lab Results ?Recent Labs  ?  09/12/21 ?0621 09/13/21 ?0448  ?WBC 32.2* 37.3*  ?HGB 8.7* 9.0*  ?HCT 26.7* 27.6*  ?NA 139 138  ?K 4.1 4.3  ?CL 109 109  ?CO2 22 22  ?BUN 56* 59*  ?CREATININE 1.52* 1.60*  ? ?Liver Panel ?Recent Labs  ?  09/11/21 ?0653 09/12/21 ?0621  ?PROT 5.0* 5.2*  ?ALBUMIN 2.3* 2.4*  ?AST 327* 308*  ?ALT 554* 523*  ?ALKPHOS 84 91  ?BILITOT 0.7 0.7  ? ? ?: ? ?Studies/Results: ?DG Chest Port 1 View ? ?Result Date: 09/11/2021 ?CLINICAL DATA:  Status post Dobbhoff placement EXAM: PORTABLE CHEST 1 VIEW COMPARISON:  09/09/2021 FINDINGS: Unchanged position of a weighted enteric tube with tip overlying the stomach. Normal cardiac and mediastinal contours. Aortic atherosclerosis. Status post median sternotomy. Moderate right layering pleural effusion with associated atelectasis. Trace left pleural effusion. IMPRESSION: 1. Weighted enteric tube terminates in the stomach. 2. Moderate right and trace left pleural effusion. Electronically Signed   By: Merilyn Baba M.D.   On: 09/11/2021 17:34  ? ?DG Abd Portable 1V ? ?Result Date: 09/13/2021 ?CLINICAL DATA:  Radiograph dated August 17, 2021 EXAM: PORTABLE ABDOMEN - 1 VIEW COMPARISON:  X-ray dated March 3rd 2023 FINDINGS: Feeding tube tip projects over the expected area of the stomach. Prominent gas-filled loops  of colon with scattered air seen in the small bowel. Moderate stool burden. Calcification of the left pelvis, likely due to calcified fibroid. IMPRESSION: 1. Feeding tube tip projects over the expected area of the stomach. 2. Prominent gas-filled loops of colon with slightly increased gaseous distension when compared with prior radiograph. Moderate stool burden. Electronically Signed   By: Yetta Glassman M.D.   On: 09/13/2021 12:28   ? ? ?Assessment/Plan: ?Metastatic ca breast with lung nodules on the rt side ?  ?Weakness- secondary to poor intake- rhabdo may be  playing a role ?  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 ?Transaminitis ( normal bili and normal alk phos) could be due to rhabdo  or secondary to liver injury from medication ?Improving ?  ?Leucocytosis- reactive vs inflammatory VS infection ?Urine culture, blood culture neg ?Could it be due to pleural effusion?  ?Pleurocentesis with culture, cell count and chem may help . Because of tachycardia, resp distress and worsening leucocytosis will start ceftriaxone and flagyl ? ?Moderate stool burden causing gaseous colon distension- which could cause tachycardia and resp distress ?  ?Rt lung infiltrates and pleural effusion- malignancy VS post obstructive pneumonia VS aspiration VS HAP ?Pt is not symptomatic ?Had received 4 days of azithromycin and 4 days of vanco/cefepime- MRSA nares neg- DC vanco and cefepime on 09/10/21- and  azithromycin 09/11/21 ? ?   ?AKI ?  ?Incomplete urinary emptying- chronic constipation contributing to it ?  ?  ?Sarcopenia and frailty of old age ? ?Poor intake-has started NG tube feeds ?RED MEWS today ? ?Discussed with daughter at bed side and with her nurses ?  ?

## 2021-09-13 NOTE — Progress Notes (Signed)
?   09/13/21 1145  ?Assess: if the MEWS score is Yellow or Red  ?Were vital signs taken at a resting state? Yes  ?Focused Assessment No change from prior assessment  ?Does the patient meet 2 or more of the SIRS criteria? No  ?Does the patient have a confirmed or suspected source of infection? No  ?Provider and Rapid Response Notified? Yes  ?MEWS guidelines implemented *See Row Information* Yes  ?Treat  ?MEWS Interventions Administered prn meds/treatments;Escalated (See documentation below)  ?Pain Scale 0-10  ?Pain Score 6  ?Pain Type Acute pain  ?Pain Location Abdomen  ?Pain Intervention(s) Medication (See eMAR)  ?Take Vital Signs  ?Increase Vital Sign Frequency  Red: Q 1hr X 4 then Q 4hr X 4, if remains red, continue Q 4hrs  ?Escalate  ?MEWS: Escalate Red: discuss with charge nurse/RN and provider, consider discussing with RRT  ?Notify: Charge Nurse/RN  ?Name of Charge Nurse/RN Notified Colletta [place]  ?Date Charge Nurse/RN Notified 09/13/21  ?Time Charge Nurse/RN Notified 1140  ?Notify: Provider  ?Provider Name/Title Dr. Vernell Leep  ?Date Provider Notified 09/13/21  ?Time Provider Notified 1145  ?Notification Type Page  ?Notification Reason Change in status ?(RED MEWS)  ?Provider response See new orders ?(KUB ordered)  ?Date of Provider Response 09/13/21  ?Time of Provider Response 1145  ?Document  ?Progress note created (see row info) Yes  ? ? ?

## 2021-09-13 NOTE — Progress Notes (Signed)
Inpatient Diabetes Program Recommendations ? ?AACE/ADA: New Consensus Statement on Inpatient Glycemic Control (2015) ? ?Target Ranges:  Prepandial:   less than 140 mg/dL ?     Peak postprandial:   less than 180 mg/dL (1-2 hours) ?     Critically ill patients:  140 - 180 mg/dL  ? ? Latest Reference Range & Units 09/12/21 23:35 09/13/21 03:45 09/13/21 07:32 09/13/21 11:43  ?Glucose-Capillary 70 - 99 mg/dL 217 (H) ? ?3 units Novolog 157 (H) ? ?2 units Novolog 268 (H) ? ?5 units Novolog 283 (H) ? ?5 units Novolog ?  ?(H): Data is abnormally high ? ?Diabetes history: DM2 ? ?Outpatient Diabetes medications: None ? ?Current Orders: Novolog 0-9 units Q4H ? ? ? ?Tube feeds started yest at 12pm (goal rate 50cc/hr--rate this AM is 30cc/hr) ? ?MD- Once tube feeds are at goal rate, please consider starting Novolog Tube feed Coverage: ? ?Novolog 3 units Q4 hours (give with SSI dose) ? ?Please HOLD if tube feeds HELD for any reason ? ? ? ?--Will follow patient during hospitalization-- ? ?Wyn Quaker RN, MSN, CDE ?Diabetes Coordinator ?Inpatient Glycemic Control Team ?Team Pager: [phone] (8a-5p) ? ? ? ? ?

## 2021-09-13 NOTE — Progress Notes (Signed)
Physical Therapy Discharge Patient Details Name: Tiffany Bailey MRN: [id number] DOB: Sep 11, 1927 Today's Date: 09/13/2021 Time:  -     Patient discharged from PT services secondary to medical decline - will need to re-order PT to resume therapy services. She was unable to stay awake for more than a few seconds. Family at bedside and in agreement to PT signing off.   Please see latest therapy progress note for current level of functioning and progress toward goals.    Progress and discharge plan discussed with patient and/or caregiver: Patient/Caregiver agrees with plan. Family was at bedside and leaning on more comfort approach. Please re-order PT if pt were to improve and be able to participate in PT.   GP     Willette Pa 09/13/2021, 10:38 AM

## 2021-09-13 NOTE — Progress Notes (Signed)
Occupational Therapy Discharge ?Patient Details ?Name: ANJELITA SHEAHAN ?MRN: [id number] ?DOB: 09-09-1927 ?Today's Date: 09/13/2021 ?Time:  -  ?  ? ?Patient discharged from OT services secondary to medical decline - will need to re-order OT to resume therapy services. Pt not engaging in therapy, unable stay awake to participate. Third attempt to see the patient on this date with two treatment sessions held earlier this week.  Family at bedside are in agreement with OT signing off at this time.  ?  ?Please see latest therapy progress note for current level of functioning and progress toward goals.   ? ?Progress and discharge plan discussed with patient and/or caregiver: Patient/Caregiver agrees with plan. Please re-consult if there is a change in functional status. Thank you! ? ?Shanon Payor, OTD OTR/L  ?09/13/21, 2:44 PM  ?

## 2021-09-13 NOTE — Progress Notes (Signed)
? ?  ?Palliative Medicine ?[place] [facility] ?Telephone:(336) [phone] Fax:(336) [phone] ? ? ?Name: Tiffany Bailey ?Date: 09/13/2021 ?MRN: [id number]  ?DOB: 1927-10-07 ? ?Patient Care Team: ?[name], FNP as PCP - General (Family Medicine) ?Jason Coop, NP as Nurse Practitioner [facility] and Palliative Medicine) ?[name], MD as Consulting Physician (Oncology) ?Minna Merritts, MD as Consulting Physician (Cardiology) ?Lauretta Grill, NP as Nurse Practitioner (Nurse Practitioner) ?Blyss Lugar, [name], NP as Nurse Practitioner [facility] and Palliative Medicine)  ? ? ?REASON FOR CONSULTATION: ?Tiffany Bailey is a 88 y.o. female with multiple medical problems including COPD, PVD status post history of left lower extremity amputation, CKD, CAD, sickle cell disease, diabetes, hypertension, and stage IV breast cancer. Patient was rotated from Arimidex to fulvestrant plus abemaciclib but has had poor tolerance of treatment.  CT on 07/06/2021 showed interval decrease in the size of pretracheal/subcarinal lymph node, consistent with treatment response. However, patient poorly tolerated abemaciclib and so this was discontinued and she has been on monthly fulvestrant since 03/16/21. Patient was hospitalized 08/31/21-09/02/21 with progressive weakness and found to have UTI. She was readmitted on 08/28/2021 for same. Patient was referred to palliative care to address goals. ? ?CODE STATUS: DNR ? ?PAST MEDICAL HISTORY: ?Past Medical History:  ?Diagnosis Date  ? Anemia   ? Anginal pain ([place])   ? Arthritis   ? Breast cancer ([place]) 07/19/2019  ? CKD (chronic kidney disease), stage III ([place])   ? Coronary artery disease   ? a. 2002 CABG x 3 (LIMA-LAD, SVG-D1, SVG-RCA); b. s/p multiple PCI's.  ? Diabetes mellitus without complication ([place])   ? Diastolic dysfunction   ? a. 12/2017 Echo: EF 55-60%, no rwma, Gr1 DD. Triv AI. Mild-mod MR/TR. PASP 41mHg.  ? Edema   ? Failure to thrive in adult   ?  Fecal impaction ([place] 08/23/2021  ? GERD (gastroesophageal reflux disease)   ? Hb-SS disease without crisis ([place] 10/23/2017  ? History of breast cancer   ? HOH (hard of hearing)   ? Hyperlipidemia   ? Hypertension   ? Myocardial infarction ([facility]   ? PAD (peripheral artery disease) ([place]   ? a. s/p L AKA; b. 12/2018 PTA R peroneal & PTA/Stenting R SFA.  ? Palpitations   ? ? ?PAST SURGICAL HISTORY:  ?Past Surgical History:  ?Procedure Laterality Date  ? ABDOMINAL HYSTERECTOMY    ? ABOVE KNEE LEG AMPUTATION Left 2013  ? ANTERIOR VITRECTOMY Left 11/16/2015  ? Procedure: ANTERIOR VITRECTOMY;  Surgeon: BEulogio Bear MD;  Location: [facility] ORS;  Service: Ophthalmology;  Laterality: Left;  ? BLADDER SURGERY    ? CATARACT EXTRACTION W/PHACO Left 11/16/2015  ? Procedure: CATARACT EXTRACTION PHACO AND INTRAOCULAR LENS PLACEMENT (IOC);  Surgeon: BEulogio Bear MD;  Location: [facility] ORS;  Service: Ophthalmology;  Laterality: Left;  Lot # [id number] ?[place] 01:22.8 ?AP%:12.6 ?CDE: 10.46  ? CATARACT EXTRACTION W/PHACO Right 08/22/2016  ? Procedure: CATARACT EXTRACTION PHACO AND INTRAOCULAR LENS PLACEMENT (IOC);  Surgeon: BEulogio Bear MD;  Location: [facility] ORS;  Service: Ophthalmology;  Laterality: Right;  Lot # [id number] ?[place] 00:52.1 ?AP%:8.7 ?CDE: 4.99 ?  ? CORONARY ANGIOPLASTY    ? STENT  ? CORONARY ARTERY BYPASS GRAFT    ? LOWER EXTREMITY ANGIOGRAPHY Right 01/14/2019  ? Procedure: LOWER EXTREMITY ANGIOGRAPHY;  Surgeon: DAlgernon Huxley MD;  [facility];  Service: Cardiovascular;  Laterality: Right;  ? MASTECTOMY    ? TUMOR REMOVAL    ?  ABDOMINAL  ? ? ?HEMATOLOGY/ONCOLOGY HISTORY:  ?Oncology History  ?Breast cancer ([place])  ?07/19/2019 Initial Diagnosis  ? Breast cancer [facility]) ?  ?07/26/2019 - 07/26/2019 Chemotherapy  ? The patient had anastrozole (ARIMIDEX) 1 MG tablet, 1 mg, Oral, Daily, 0 of 1 cycle, Start date: --, End date: -- ?abemaciclib (VERZENIO) 150 MG tablet, 150 mg, Oral, 2 times daily, 0 of 1 cycle, Start date: --,  End date: -- ? ? for chemotherapy treatment.  ? ?  ? ? ?ALLERGIES:  is allergic to amoxicillin, gabapentin, mirtazapine, and other. ? ?MEDICATIONS:  ?Current Facility-Administered Medications  ?Medication Dose Route Frequency Provider Last Rate Last Admin  ? acetaminophen (TYLENOL) tablet 650 mg  650 mg Oral Q6H PRN Wyvonnia Dusky, MD      ? Chlorhexidine Gluconate Cloth 2 % PADS 6 each  6 each Topical Daily Wyvonnia Dusky, MD   6 each at 09/13/21 [id number]  ? docusate (COLACE) 50 MG/5ML liquid 100 mg  100 mg Per Tube BID Modena Jansky, MD      ? dronabinol (MARINOL) capsule 2.5 mg  2.5 mg Oral QAC lunch Wyvonnia Dusky, MD   2.5 mg at 09/12/21 1227  ? feeding supplement (ENSURE ENLIVE / ENSURE PLUS) liquid 237 mL  237 mL Oral BID BM Wyvonnia Dusky, MD   237 mL at 09/13/21 5993  ? feeding supplement (OSMOLITE 1.2 CAL) liquid 1,000 mL  1,000 mL Per Tube Continuous Wyvonnia Dusky, MD 30 mL/hr at 09/13/21 0000 1,000 mL at 09/13/21 0000  ? free water 80 mL  80 mL Per Tube Q6H Wyvonnia Dusky, MD   80 mL at 09/13/21 1208  ? heparin injection 5,000 Units  5,000 Units Subcutaneous Q8H Emeterio Reeve, DO   5,000 Units at 09/13/21 1538  ? insulin aspart (novoLOG) injection 0-9 Units  0-9 Units Subcutaneous Q4H Wyvonnia Dusky, MD   5 Units at 09/13/21 1202  ? ipratropium-albuterol (DUONEB) 0.5-2.5 (3) MG/3ML nebulizer solution 3 mL  3 mL Nebulization Q6H PRN Wyvonnia Dusky, MD      ? labetalol (NORMODYNE) injection 10 mg  10 mg Intravenous Q2H PRN Sharion Settler, NP   10 mg at 09/11/21 0535  ? lip balm (BLISTEX) ointment   Topical PRN Wyvonnia Dusky, MD      ? metroNIDAZOLE (FLAGYL) IVPB 500 mg  500 mg Intravenous Q12H Tsosie Billing, MD 100 mL/hr at 09/13/21 1533 500 mg at 09/13/21 1533  ? morphine (PF) 2 MG/ML injection 1 mg  1 mg Intravenous Q4H PRN Wyvonnia Dusky, MD   1 mg at 09/13/21 1204  ? multivitamin liquid 15 mL  15 mL Per Tube Daily Dorothe Pea, RPH    15 mL at 09/13/21 1534  ? oxyCODONE-acetaminophen (PERCOCET/ROXICET) 5-325 MG per tablet 1 tablet  1 tablet Oral Q6H PRN Wyvonnia Dusky, MD   1 tablet at 09/12/21 1643  ? phenol (CHLORASEPTIC) mouth spray 1 spray  1 spray Mouth/Throat PRN Wyvonnia Dusky, MD      ? simethicone (MYLICON) 40 TT/0.1XB suspension 80 mg  80 mg Per Tube Q6H PRN Dorothe Pea, RPH      ? ? ?VITAL SIGNS: ?BP 136/64 (BP Location: Left Wrist)   Pulse (!) 118   Temp 98.1 ?F (36.7 ?C) (Oral)   Resp (!) 28 Comment: Nurse notified about vitals. Will continue to monitor.  Ht 5' (1.524 m)   Wt 95 lb 7.4 oz (43.3 kg)   SpO2  100%   BMI 18.64 kg/m?  ?Filed Weights  ? 09/10/21 0500 09/11/21 0500 09/12/21 0500  ?Weight: 95 lb 0.3 oz (43.1 kg) 91 lb 14.9 oz (41.7 kg) 95 lb 7.4 oz (43.3 kg)  ?  ?Estimated body mass index is 18.64 kg/m? as calculated from the following: ?  Height as of this encounter: 5' (1.524 m). ?  Weight as of this encounter: 95 lb 7.4 oz (43.3 kg). ? ?LABS: ?CBC: ?   ?Component Value Date/Time  ? WBC 37.3 (H) 09/13/2021 0448  ? HGB 9.0 (L) 09/13/2021 0448  ? HCT 27.6 (L) 09/13/2021 0448  ? PLT 417 (H) 09/13/2021 0448  ? MCV 96.8 09/13/2021 0448  ? NEUTROABS 31.9 (H) 09/09/2021 1244  ? LYMPHSABS 1.1 09/09/2021 1244  ? MONOABS 1.9 (H) 09/09/2021 1244  ? EOSABS 0.0 09/09/2021 1244  ? BASOSABS 0.1 09/09/2021 1244  ? ?Comprehensive Metabolic Panel: ?   ?Component Value Date/Time  ? NA 138 09/13/2021 0448  ? K 4.3 09/13/2021 0448  ? CL 109 09/13/2021 0448  ? CO2 22 09/13/2021 0448  ? BUN 59 (H) 09/13/2021 0448  ? CREATININE 1.60 (H) 09/13/2021 0448  ? CREATININE 1.18 (H) 06/19/2018 1006  ? GLUCOSE 186 (H) 09/13/2021 0448  ? CALCIUM 9.3 09/13/2021 0448  ? AST 308 (H) 09/12/2021 4967  ? ALT 523 (H) 09/12/2021 5916  ? ALKPHOS 91 09/12/2021 0621  ? BILITOT 0.7 09/12/2021 3846  ? PROT 5.2 (L) 09/12/2021 6599  ? ALBUMIN 2.4 (L) 09/12/2021 3570  ? ? ?RADIOGRAPHIC STUDIES: ?CT ABDOMEN PELVIS WO CONTRAST ? ?Result Date:  08/23/2021 ?CLINICAL DATA:  Abdominal pain EXAM: CT ABDOMEN AND PELVIS WITHOUT CONTRAST TECHNIQUE: Multidetector CT imaging of the abdomen and pelvis was performed following the standard protocol without IV contrast. RADI

## 2021-09-14 ENCOUNTER — Inpatient Hospital Stay: Payer: Medicare Other

## 2021-09-14 DIAGNOSIS — R627 Adult failure to thrive: Secondary | ICD-10-CM | POA: Diagnosis not present

## 2021-09-14 DIAGNOSIS — E1159 Type 2 diabetes mellitus with other circulatory complications: Secondary | ICD-10-CM | POA: Diagnosis not present

## 2021-09-14 DIAGNOSIS — D72828 Other elevated white blood cell count: Secondary | ICD-10-CM | POA: Diagnosis not present

## 2021-09-14 DIAGNOSIS — Z853 Personal history of malignant neoplasm of breast: Secondary | ICD-10-CM | POA: Diagnosis not present

## 2021-09-14 DIAGNOSIS — N179 Acute kidney failure, unspecified: Secondary | ICD-10-CM | POA: Diagnosis not present

## 2021-09-14 DIAGNOSIS — R748 Abnormal levels of other serum enzymes: Secondary | ICD-10-CM | POA: Diagnosis not present

## 2021-09-14 DIAGNOSIS — R6520 Severe sepsis without septic shock: Secondary | ICD-10-CM | POA: Diagnosis not present

## 2021-09-14 DIAGNOSIS — Z794 Long term (current) use of insulin: Secondary | ICD-10-CM | POA: Diagnosis not present

## 2021-09-14 DIAGNOSIS — E1122 Type 2 diabetes mellitus with diabetic chronic kidney disease: Secondary | ICD-10-CM | POA: Diagnosis not present

## 2021-09-14 DIAGNOSIS — N183 Chronic kidney disease, stage 3 unspecified: Secondary | ICD-10-CM

## 2021-09-14 DIAGNOSIS — A419 Sepsis, unspecified organism: Secondary | ICD-10-CM | POA: Diagnosis not present

## 2021-09-14 DIAGNOSIS — K7200 Acute and subacute hepatic failure without coma: Secondary | ICD-10-CM | POA: Diagnosis not present

## 2021-09-14 LAB — COMPREHENSIVE METABOLIC PANEL
ALT: 387 U/L — ABNORMAL HIGH (ref 0–44)
AST: 176 U/L — ABNORMAL HIGH (ref 15–41)
Albumin: 2.1 g/dL — ABNORMAL LOW (ref 3.5–5.0)
Alkaline Phosphatase: 78 U/L (ref 38–126)
Anion gap: 7 (ref 5–15)
BUN: 76 mg/dL — ABNORMAL HIGH (ref 8–23)
CO2: 22 mmol/L (ref 22–32)
Calcium: 9.1 mg/dL (ref 8.9–10.3)
Chloride: 112 mmol/L — ABNORMAL HIGH (ref 98–111)
Creatinine, Ser: 2.01 mg/dL — ABNORMAL HIGH (ref 0.44–1.00)
GFR, Estimated: 23 mL/min — ABNORMAL LOW (ref >60)
Glucose, Bld: 248 mg/dL — ABNORMAL HIGH (ref 70–99)
Potassium: 4.1 mmol/L (ref 3.5–5.1)
Sodium: 141 mmol/L (ref 135–145)
Total Bilirubin: 0.6 mg/dL (ref 0.3–1.2)
Total Protein: 5.1 g/dL — ABNORMAL LOW (ref 6.5–8.1)

## 2021-09-14 LAB — CBC
HCT: 27 % — ABNORMAL LOW (ref 36.0–46.0)
Hemoglobin: 8.4 g/dL — ABNORMAL LOW (ref 12.0–15.0)
MCH: 31.2 pg (ref 26.0–34.0)
MCHC: 31.1 g/dL (ref 30.0–36.0)
MCV: 100.4 fL — ABNORMAL HIGH (ref 80.0–100.0)
Platelets: 359 10*3/uL (ref 150–400)
RBC: 2.69 MIL/uL — ABNORMAL LOW (ref 3.87–5.11)
RDW: 16.3 % — ABNORMAL HIGH (ref 11.5–15.5)
WBC: 45.6 10*3/uL — ABNORMAL HIGH (ref 4.0–10.5)
nRBC: 0.2 % (ref 0.0–0.2)

## 2021-09-14 LAB — GLUCOSE, CAPILLARY
Glucose-Capillary: 229 mg/dL — ABNORMAL HIGH (ref 70–99)
Glucose-Capillary: 272 mg/dL — ABNORMAL HIGH (ref 70–99)
Glucose-Capillary: 294 mg/dL — ABNORMAL HIGH (ref 70–99)

## 2021-09-14 LAB — CK: Total CK: 2824 U/L — ABNORMAL HIGH (ref 38–234)

## 2021-09-14 MED ORDER — SODIUM CHLORIDE 0.9 % IV SOLN: INTRAVENOUS | Status: DC

## 2021-09-14 MED ORDER — POLYETHYLENE GLYCOL 3350 17 G PO PACK
17.0000 g | PACK | Freq: Every day | ORAL | Status: DC
  Administered 2021-09-14: 17 g via ORAL
  Filled 2021-09-14: qty 1

## 2021-09-14 MED ORDER — ADULT MULTIVITAMIN LIQUID CH
15.0000 mL | Freq: Every day | ORAL | Status: DC
  Administered 2021-09-14: 15 mL
  Filled 2021-09-14: qty 15

## 2021-09-14 MED ORDER — INSULIN ASPART 100 UNIT/ML IJ SOLN
3.0000 [IU] | INTRAMUSCULAR | Status: DC
  Filled 2021-09-14: qty 1

## 2021-09-15 NOTE — Death Summary Note (Signed)
? ?DEATH SUMMARY  ? ?Patient Details  ?Name: Tiffany Bailey ?MRN: [id number] ?DOB: Jul 26, 1927 ?NTI:[id number], [name], FNP ?Admission/Discharge Information  ? ?Admit Date:  2021-09-08  ?Date of Death:  16-Sep-2021  ?Time of Death:  1514/09/01  ?Length of Stay: 8  ? ?Principle Cause of death: Sepsis ? ?Hospital Diagnoses: ?Principal Problem: ?  Sepsis ([place]) ?Active Problems: ?  DM (diabetes mellitus) ([place]) ?  Essential hypertension ?  CAD (coronary artery disease), native coronary artery ?  GERD (gastroesophageal reflux disease) ?  PAD (peripheral artery disease) ([place]) ?  Hx of AKA (above knee amputation), left ([place]) ?  Anxiety and depression ?  Acute kidney injury superimposed on chronic kidney disease ([place]) ?  Metastatic breast cancer ([place]) ?  Hb-SS disease without crisis ([place]) ?  Hyperkalemia ?  Goals of care, counseling/discussion ?  Pleural effusion ?  UTI (urinary tract infection) ?  Physical deconditioning ?  Transaminitis ?  Failure to thrive in adult ?  Dehydration ?  Elevated liver enzymes ?  Protein-calorie malnutrition, severe ?  Palliative care encounter ?  Leucocytosis ? ? ?Hospital Course: ?[name] is a 88 y.o. female with history of diabetes, CAD, hypertension, recent hospitalization for UTI, CHF, along other medical problems, presents emergency department with weakness.  Patient was recently admitted to the hospital for UTI and discharged 4 days ago.  Patient had an acute UTI and Foley was placed.  Readmitted with a diagnosis of possible sepsis due to possible UTI and pneumonia, transaminitis, hyperkalemia, acute kidney injury complicating CKD, and rhabdomyolysis. iD was following. Due to her poor prognosis, palliative care was consulted and recommended hospice approach.  Family divided about goals of care and decision was made to continue feeding tube. ?At 2:50 pm, code blue was called because she became hypotensive, pulseless.  Family revoked the DNR status, made full code ACLS protocol initiated.   CPR was initiated.  Extensive discussion done with the family while the code was running.  Daughter changed her mind and she requested to stop the code.  Time of death: 15:16. ? ?Procedures:NG tube placement  ? ?Consultations: Palliative care ? ?The results of significant diagnostics from this hospitalization (including imaging, microbiology, ancillary and laboratory) are listed below for reference.  ? ?Significant Diagnostic Studies: ?CT ABDOMEN PELVIS WO CONTRAST ? ?Result Date: 08/23/2021 ?CLINICAL DATA:  Abdominal pain EXAM: CT ABDOMEN AND PELVIS WITHOUT CONTRAST TECHNIQUE: Multidetector CT imaging of the abdomen and pelvis was performed following the standard protocol without IV contrast. RADIATION DOSE REDUCTION: This exam was performed according to the departmental dose-optimization program which includes automated exposure control, adjustment of the mA and/or kV according to patient size and/or use of iterative reconstruction technique. COMPARISON:  07/06/2021 FINDINGS: Lower chest: Coronary artery calcifications are seen. There is small to moderate right pleural effusion with interval increase. Infiltrates in the posterior right lower lung fields may suggest compression atelectasis or pneumonia. There are linear densities in the right middle lobe, lingula and left lower lobe suggesting scarring or subsegmental atelectasis. Metallic sutures seen in the sternum. Hepatobiliary: No focal abnormality is seen in the liver. Gallbladder is not distinctly seen. Pancreas: There is pancreatic atrophy. No focal abnormality is seen. Spleen: Spleen is small in size. Adrenals/Urinary Tract: Adrenals are unremarkable. There is no hydronephrosis. There are multiple calcific densities in both kidneys some of which appear to be calcification in the renal artery branches. This may be nonobstructing renal stones. There are few renal cysts largest in the upper pole  of right kidney measuring 5.1 cm. Urinary bladder is distended.  Stomach/Bowel: Small hiatal hernia is seen. Stomach is not distended. There is no significant small bowel dilation. Appendix is not distinctly seen. Large amount of stool is seen in ascending and transverse colon. There is no fecal impaction in the rectosigmoid. There is no dilation of descending and sigmoid colon. Vascular/Lymphatic: Extensive arterial calcifications are seen. Reproductive: Uterus is not seen. Other: There is no ascites or pneumoperitoneum. Small umbilical hernia containing fat is seen. Musculoskeletal: Unremarkable. IMPRESSION: Large amount of stool is seen in the ascending and transverse colon without evidence of fecal impaction in the rectosigmoid. There is no evidence intestinal obstruction or pneumoperitoneum. There is no hydronephrosis. Right renal cysts. There are multiple small calcifications in both kidneys suggesting nonobstructing renal stones and possibly calcifications in the renal artery branches. Small to moderate right pleural effusion. Infiltrates in the right lower lung fields may suggest atelectasis/pneumonia. Other findings as described in the body of the report. Electronically Signed   By: Elmer Picker M.D.   On: 08/23/2021 14:22  ? ?CT HEAD WO CONTRAST (5MM) ? ?Result Date: 08/31/2021 ?CLINICAL DATA:  Failure to thrive. EXAM: CT HEAD WITHOUT CONTRAST TECHNIQUE: Contiguous axial images were obtained from the base of the skull through the vertex without intravenous contrast. RADIATION DOSE REDUCTION: This exam was performed according to the departmental dose-optimization program which includes automated exposure control, adjustment of the mA and/or kV according to patient size and/or use of iterative reconstruction technique. COMPARISON:  CT head dated January 07, 2021. FINDINGS: Brain: No evidence of acute infarction, hemorrhage, hydrocephalus, extra-axial collection or mass lesion/mass effect. Stable atrophy and chronic microvascular ischemic changes. Vascular: Calcified  atherosclerosis at the skull base. No hyperdense vessel. Skull: Normal. Negative for fracture or focal lesion. Sinuses/Orbits: No acute finding. Other: None. IMPRESSION: 1. No acute intracranial abnormality. Electronically Signed   By: Titus Dubin M.D.   On: 08/31/2021 15:14  ? ?CT CHEST WO CONTRAST ? ?Result Date: 09/10/2021 ?CLINICAL DATA:  A 88 year old female presents for evaluation of abnormal chest findings. EXAM: CT CHEST WITHOUT CONTRAST TECHNIQUE: Multidetector CT imaging of the chest was performed following the standard protocol without IV contrast. RADIATION DOSE REDUCTION: This exam was performed according to the departmental dose-optimization program which includes automated exposure control, adjustment of the mA and/or kV according to patient size and/or use of iterative reconstruction technique. COMPARISON:  July 06, 2021. FINDINGS: Cardiovascular: Calcified atheromatous plaque of the thoracic aorta. No aneurysmal dilation. Heart size normal without pericardial effusion. Marked calcified coronary artery disease as before. Central pulmonary vasculature is normal caliber. Mediastinum/Nodes: Thoracic inlet structures are normal. No signs of axillary lymphadenopathy. Mediastinal adenopathy with similar distribution, (image 60/2) RIGHT paratracheal lymph node at 18 mm previously 24 mm short axis. Peripherally calcified subcarinal lymph node at 22 mm short axis, similar size when measured by this observer on the prior exam. No gross hilar adenopathy. No new signs of nodal enlargement. Mildly patulous esophagus. Lungs/Pleura: (Image 20/3) 6 mm LEFT upper lobe pulmonary nodule. (Image 76/3) stable 13 x 9 mm LEFT upper lobe ground-glass nodule. Other LEFT upper lobe area of ground-glass is unchanged. RIGHT upper lobe ground-glass/subsolid nodule measuring 14 x 10 mm previously 17 x 11 mm. Peri fissural nodules in the RIGHT upper lobe are also stable the largest at 6 mm. (Image 54/3) Small nodules in the  RIGHT middle lobe ir also unchanged as is a Peri fissural nodule in the accessory fissure in the RIGHT lower  lobe. Juxta diaphragmatic nodule along the pleural surface in the RIGHT lower lobe measuring up t

## 2021-09-15 NOTE — Progress Notes (Signed)
Went in room at 1445 to give meds and talk to family, patient's breathing looked agonal. Checked vitals and bp 60/40, HR 30 and patient unresponsive. Messaged dr.adhikari to come see patient and discuss patient's condition with family. Patient is DNR. While in the room patient stopped breathing and lost a pulse. Daughter at bedside stated to do something to help her. I told her that her mom is a DNR. She stated that she wanted to reverse it so therefore a code blue was called and chest compressions were started at 1453. Family came back into room during code and decided to stop resuscitation efforts. Time of death 09/28/14. ?

## 2021-09-15 NOTE — Progress Notes (Signed)
Chaplain Maggie responded to code blue. Family present in the hallway during code. Chaplain offered non-anxious presence, hospitality, and prayer.  ?

## 2021-09-15 NOTE — Progress Notes (Signed)
? ?Hematology/Oncology Progress note ?[phone] [phone] ?  ? ? ?Patient Care Team: ?[name], FNP as PCP - General (Family Medicine) ?Jason Coop, NP as Nurse Practitioner [facility] and Palliative Medicine) ?[name], MD as Consulting Physician (Oncology) ?Minna Merritts, MD as Consulting Physician (Cardiology) ?Lauretta Grill, NP as Nurse Practitioner (Nurse Practitioner) ?Borders, [name], NP as Nurse Practitioner [facility] and Palliative Medicine)  ? ?Name of the patient: Tiffany Bailey  ?[id number]  ?06-04-1928  ?Date of visit: 01-Oct-2021 ? ? ?INTERVAL HISTORY-  ? ?Patient is lethargic ?+ tube feeding.  ?Son [name] is at bedside. ? ? ? ?Review of systems- Review of Systems  ?Unable to perform ROS: Mental status change  ? ?Allergies  ?Allergen Reactions  ? Amoxicillin Hives and Nausea Only  ? Gabapentin Other (See Comments)  ?  AMS ?Confusion  ? Mirtazapine Itching  ?  Dry mouth with ODT ?Denies SOB with medication  ? Other   ?  UNKNOWN PAIN MEDICATION - unknown reaction  ? ? ?Patient Active Problem List  ? Diagnosis Date Noted  ? Palliative care encounter   ? Leucocytosis   ? Protein-calorie malnutrition, severe 09/07/2021  ? Sepsis ([place]) 09/04/2021  ? Transaminitis 09/11/2021  ? Failure to thrive in adult 09/05/2021  ? Dehydration   ? Elevated liver enzymes   ? Failure to thrive (child)   ? Lethargic 08/31/2021  ? UTI (urinary tract infection) 08/31/2021  ? Physical deconditioning 08/31/2021  ? Pressure injury of sacral region, stage 2 ([place]) 08/24/2021  ? Hyperglycemia 04/26/2021  ? Encounter for antineoplastic chemotherapy 04/12/2021  ? Pressure injury of skin 03/31/2021  ? Malignant neoplasm of breast in female, estrogen receptor positive ([place]) 03/30/2021  ? Pleural effusion 03/14/2021  ? Cough 02/13/2021  ? Cancer ([place]) 11/07/2020  ? Type 2 diabetes mellitus with diabetic neuropathy, unspecified ([place]) 11/02/2020  ? Osteopenia 10/25/2019  ? Use of anastrozole  (Arimidex) 08/18/2019  ? Anemia due to stage 3a chronic kidney disease ([place]) 08/18/2019  ? 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 Nausea 08/03/2019  ? Breast cancer ([place]) 07/19/2019  ? Goals of care, counseling/discussion 07/19/2019  ? Lung mass 02/10/2019  ? Constipation 01/27/2019  ? Headache 01/25/2019  ? Right leg swelling 01/25/2019  ? Metastatic breast cancer ([place]) 01/05/2019  ? Anxiety and depression 12/16/2018  ? Hx of AKA (above knee amputation), left ([place]) 08/26/2018  ? Left arm swelling 07/01/2018  ? Carotid stenosis 12/19/2017  ? CAD (coronary artery disease) 10/23/2017  ? Hb-SS disease without crisis ([place]) 10/23/2017  ? History of CHF (congestive heart failure) 10/23/2017  ? Old myocardial infarction 10/23/2017  ? Hand cramp 10/17/2017  ? Acute non-recurrent maxillary sinusitis 10/17/2017  ? Skin lesion 09/29/2017  ? Diarrhea 04/23/2017  ? Weight loss 09/12/2016  ? PAD (peripheral artery disease) ([place]) 08/14/2016  ? DM (diabetes mellitus) ([place]) 03/25/2016  ? Chronic tension-type headache, not intractable 03/25/2016  ? Essential hypertension 03/25/2016  ? CAD (coronary artery disease), native coronary artery 03/25/2016  ? GERD (gastroesophageal reflux disease) 03/25/2016  ? Hyperlipidemia 03/25/2016  ? History of breast cancer 03/25/2016  ? Acute kidney injury superimposed on chronic kidney disease ([place]) 01/28/2016  ? Hyperkalemia 01/28/2016  ? Hyponatremia 01/28/2016  ? Urinary retention 01/28/2016  ? Hypertensive urgency 01/15/2016  ? History of nonadherence to medical treatment 09/08/2015  ? Epigastric pain 08/10/2014  ? Ischemic heart disease due to coronary artery obstruction ([place]) 01/07/2013  ? ? ? ?Past Medical History:  ?Diagnosis Date  ?  Anemia   ? Anginal pain ([place])   ? Arthritis   ? Breast cancer ([place]) 07/19/2019  ? CKD (chronic kidney disease), stage III ([place])   ? Coronary artery disease   ? a. 2002 CABG x 3 (LIMA-LAD, SVG-D1, SVG-RCA); b. s/p multiple PCI's.  ? Diabetes mellitus without  complication ([place])   ? Diastolic dysfunction   ? a. 12/2017 Echo: EF 55-60%, no rwma, Gr1 DD. Triv AI. Mild-mod MR/TR. PASP 82mHg.  ? Edema   ? Failure to thrive in adult   ? Fecal impaction ([place] 08/23/2021  ? GERD (gastroesophageal reflux disease)   ? Hb-SS disease without crisis ([place] 10/23/2017  ? History of breast cancer   ? HOH (hard of hearing)   ? Hyperlipidemia   ? Hypertension   ? Myocardial infarction ([facility]   ? PAD (peripheral artery disease) ([place]   ? a. s/p L AKA; b. 12/2018 PTA R peroneal & PTA/Stenting R SFA.  ? Palpitations   ? ? ? ?Past Surgical History:  ?Procedure Laterality Date  ? ABDOMINAL HYSTERECTOMY    ? ABOVE KNEE LEG AMPUTATION Left 2013  ? ANTERIOR VITRECTOMY Left 11/16/2015  ? Procedure: ANTERIOR VITRECTOMY;  Surgeon: BEulogio Bear MD;  Location: [facility] ORS;  Service: Ophthalmology;  Laterality: Left;  ? BLADDER SURGERY    ? CATARACT EXTRACTION W/PHACO Left 11/16/2015  ? Procedure: CATARACT EXTRACTION PHACO AND INTRAOCULAR LENS PLACEMENT (IOC);  Surgeon: BEulogio Bear MD;  Location: [facility] ORS;  Service: Ophthalmology;  Laterality: Left;  Lot # [id number] ?[place] 01:22.8 ?AP%:12.6 ?CDE: 10.46  ? CATARACT EXTRACTION W/PHACO Right 08/22/2016  ? Procedure: CATARACT EXTRACTION PHACO AND INTRAOCULAR LENS PLACEMENT (IOC);  Surgeon: BEulogio Bear MD;  Location: [facility] ORS;  Service: Ophthalmology;  Laterality: Right;  Lot # [id number] ?[place] 00:52.1 ?AP%:8.7 ?CDE: 4.99 ?  ? CORONARY ANGIOPLASTY    ? STENT  ? CORONARY ARTERY BYPASS GRAFT    ? LOWER EXTREMITY ANGIOGRAPHY Right 01/14/2019  ? Procedure: LOWER EXTREMITY ANGIOGRAPHY;  Surgeon: DAlgernon Huxley MD;  [facility];  Service: Cardiovascular;  Laterality: Right;  ? MASTECTOMY    ? TUMOR REMOVAL    ? ABDOMINAL  ? ? ?Social History  ? ?Socioeconomic History  ? Marital status: Divorced  ?  Spouse name: Not on file  ? Number of children: 7  ? Years of education: Not on file  ? Highest education level: Not on file  ?Occupational History  ?  Occupation: retired  ?  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 Smoking status: Never  ? Smokeless tobacco: Never  ?Vaping Use  ? Vaping Use: Never used  ?Substance and Sexual Activity  ? Alcohol use: No  ? Drug use: No  ? Sexual activity: Not Currently  ?Other Topics Concern  ? Not on file  ?Social History Narrative  ? Not on file  ? ?Social Determinants of Health  ? ?Financial Resource Strain: Low Risk   ? Difficulty of Paying Living Expenses: Not hard at all  ?Food Insecurity: No Food Insecurity  ? Worried About RCharity fundraiserin the Last Year: Never true  ? Ran Out of Food in the Last Year: Never true  ?Transportation Needs: No Transportation Needs  ? Lack of Transportation (Medical): No  ? Lack of Transportation (Non-Medical): No  ?Physical Activity: Insufficiently Active  ? Days of Exercise per Week: 7 days  ? Minutes of Exercise per Session: 10 min  ?Stress: No Stress Concern Present  ?  Feeling of Stress : Not at all  ?Social Connections: Moderately Isolated  ? Frequency of Communication with Friends and Family: More than three times a week  ? Frequency of Social Gatherings with Friends and Family: More than three times a week  ? Attends Religious Services: More than 4 times per year  ? Active Member of Clubs or Organizations: No  ? Attends Archivist Meetings: Never  ? Marital Status: Divorced  ?Intimate Partner Violence: Not At Risk  ? Fear of Current or Ex-Partner: No  ? Emotionally Abused: No  ? Physically Abused: No  ? Sexually Abused: No  ? ?  ?Family History  ?Problem Relation Age of Onset  ? Sudden death Mother   ? Arthritis Father   ? Stroke Father   ? Hypertension Sister   ? Hypertension Sister   ? Diabetes Grandchild   ? ? ? ?Current Facility-Administered Medications:  ?  0.9 %  sodium chloride infusion, , Intravenous, Continuous, Adhikari, Amrit, MD ?  acetaminophen (TYLENOL) tablet 650 mg, 650 mg, Oral, Q6H PRN, Wyvonnia Dusky, MD ?  cefTRIAXone (ROCEPHIN) 1 g in sodium  chloride 0.9 % 100 mL IVPB, 1 g, Intravenous, Q24H, Ravishankar, Jayashree, MD, Last Rate: 200 mL/hr at 09/13/21 1736, 1 g at 09/13/21 1736 ?  Chlorhexidine Gluconate Cloth 2 % PADS 6 each, 6 each, Topical, Daily,

## 2021-09-15 NOTE — Progress Notes (Signed)
?PROGRESS NOTE ? ?Tiffany Bailey  PNT:[id number] DOB: 08-Feb-1928 DOA: 08/23/2021 ?PCP: Tiffany Hawthorne, FNP  ? ?Brief Narrative: ?Tiffany Bailey is a 88 y.o. female with history of diabetes, CAD, hypertension, recent hospitalization for UTI, CHF, along other medical problems, see past medical history, presents emergency department with weakness.  Patient was recently admitted to the hospital for UTI and discharged 4 days ago.  Patient had an acute UTI and Foley have been placed.  Readmitted with a diagnosis of possible sepsis due to possible UTI and pneumonia, transaminitis, hyperkalemia, acute kidney injury complicating CKD, and rhabdomyolysis.  Due to her poor prognosis, palliative care was consulted and recommended hospice approach.  Family divided about goals of care and decision was made to continue feeding tube. ? ?Assessment & Plan: ? ?Principal Problem: ?  Sepsis ([place]) ?Active Problems: ?  DM (diabetes mellitus) ([place]) ?  Essential hypertension ?  CAD (coronary artery disease), native coronary artery ?  GERD (gastroesophageal reflux disease) ?  PAD (peripheral artery disease) ([place]) ?  Hx of AKA (above knee amputation), left ([place]) ?  Anxiety and depression ?  Acute kidney injury superimposed on chronic kidney disease ([place]) ?  Metastatic breast cancer ([place]) ?  Hb-SS disease without crisis ([place]) ?  Hyperkalemia ?  Goals of care, counseling/discussion ?  Pleural effusion ?  UTI (urinary tract infection) ?  Physical deconditioning ?  Transaminitis ?  Failure to thrive in adult ?  Dehydration ?  Elevated liver enzymes ?  Protein-calorie malnutrition, severe ?  Palliative care encounter ?  Leucocytosis ? ? ? Failure to thrive in adult: w/ poor po intake as well. NG placed x3 and pulled out 3 times by pt. Pt stated she did not want a feeding tube 09/10/21 but pt's son Tiffany Bailey would like for the pt to have a feeding tube despite this. A 4th NG tube was placed on 3/28 and tube feeds were started 3/29 and currently going  at 30 mL/h with plan to increase to goal at 50 mL/h. Palliative care is following and recs apprec: As per input on 3/27: Continuing current scope of treatment and palliative continues to discuss with family regarding goals. ? ?Constipation: Patient reported some bloating and abdominal discomfort.  Obtained KUB which shows NG tube in appropriate position but also shows prominent gas-filled loops of colon with slightly increased gaseous distention compared to prior radiograph and moderate stool burden. Given an  enema x1 Monitor.Now having bowel movement ?  ?Sepsis: met criteria w/ leukocytosis, tachypnea & likely UTI. Blood cxs no growth. Urine cx shows no growth. Repeat procal 0.85.  Still has severe leukocytosis.  ID following.  Remains in sinus tachycardia.chest x-ray follow-up done today showed unchanged  layering pleural effusion and associated atelectasis or consolidation of the lower right lung. No new airspace opacity. ?  ?Transaminitis: etiology unclear, possibly shock liver or associated with elevated CK. Hepatitis panel is neg. [place] of RUQ was normal. AST, ALT continues to trend down.   ?  ?Hypernatremia: resolved  ?  ?Hyperkalemia: resolved  ?  ?AKI on CKD: baseline Cr/GFR is unknown, currently stage IIIb.  AKI is ikely secondary rhabdomyolysis.  Creatinine has gradually increased may related to poor oral intake.  Started on IV fluids again ?  ?Possible post obstructive pneumonia: as per ID. Abxs were d/c by ID. Continue on bronchodilators & encourage incentive spirometry. Legionella urine antigen negative. ?  ?Rhabdomyolysis:  significant. Etiology unclear.  Peak of 28,709 on 3/23, down to around  5K on 3/29, now down to the range of 2K.  Per ID, some question of Arimidex induced myositis, paraneoplastic myositis but doubt viral/infectious etiology. ?  ?UTI: urine cx shows no growth. UA was positive on admission and abxs started before cx was taken. Abxs were d/c as per ID  ?  ?Leukocytosis: significant &  labile. Etiology unclear, infection vs blood dyscrasia vs medication ADRs.  Onco & ID were following.  Has been up >30 K for the last several days. ?  ?Thrombocytosis: resolved  ?  ?DM2: HbA1c 8.5, poorly controlled. Continue on SSI w/ accuchecks  ?  ?Metastatic breast cancer: not candidate for aggressive treatment as per onco. Onco following and rec apprec ?  ?Pleural effusion: small as per XR. Not requiring supplemental oxygen  ?  ?Severe protein calorie malnutrition: pt is refusing nutritional supplements. NG tube placed x 3 and pulled out 3 times by pt. Pt stated this that she did not want a feeding tube on 09/10/21. A 4th NG tube was placed on 3/28 and pt was started on tube feeds 3/29 ?  ?Anemia, possibly of chronic disease: Stable. ? ?Goals of care: Due to her significant comorbidities, she might be a candidate for palliative approach/hospice care.  Palliative care closely following and discussing with family.  But as per her son, her mom wishes to leave and family continues to remain full scope of treatment, continue feeding tube ?  ? ? ?Nutrition Problem: Severe Malnutrition ?Etiology: chronic illness (UTI, CHF, FTT, metastatic breast CA) ?Pressure Injury 03/30/21 Sacrum Mid Stage 2 -  Partial thickness loss of dermis presenting as a shallow open injury with a red, pink wound bed without slough. Small pressure sore withs scab (Active)  ?03/30/21 2236  ?Location: Sacrum  ?Location Orientation: Mid  ?Staging: Stage 2 -  Partial thickness loss of dermis presenting as a shallow open injury with a red, pink wound bed without slough.  ?Wound Description (Comments): Small pressure sore withs scab  ?Present on Admission: Yes  ?Dressing Type Foam - Lift dressing to assess site every shift 09/11/21 2000  ?   ?Pressure Injury 08/31/21 Buttocks Stage 2 -  Partial thickness loss of dermis presenting as a shallow open injury with a red, pink wound bed without slough. (Active)  ?08/31/21 1900  ?Location: Buttocks   ?Location Orientation:   ?Staging: Stage 2 -  Partial thickness loss of dermis presenting as a shallow open injury with a red, pink wound bed without slough.  ?Wound Description (Comments):   ?Present on Admission: Yes  ?Dressing Type Foam - Lift dressing to assess site every shift 09/11/21 2000  ? ? ?DVT prophylaxis:heparin injection 5,000 Units Start: 09/10/2021 2200 ? ? ?  Code Status: DNR ? ?Family Communication: Long discussion held with son at the bedside ? ?Patient status: Inpatient ? ?Patient is from : Home ? ?Anticipated discharge to: Not sure ?Estimated DC date: Not sure ? ? ?Consultants: Reviewed, ID ? ?Procedures: NG tube placement ? ?Antimicrobials:  ?Anti-infectives (From admission, onward)  ? ? Start     Dose/Rate Route Frequency Ordered Stop  ? 09/13/21 1800  cefTRIAXone (ROCEPHIN) 1 g in sodium chloride 0.9 % 100 mL IVPB       ? 1 g ?200 mL/hr over 30 Minutes Intravenous Every 24 hours 09/13/21 1555    ? 09/13/21 1600  cefTRIAXone (ROCEPHIN) 1 g in sodium chloride 0.9 % 100 mL IVPB  Status:  Discontinued       ? 1 g ?200  mL/hr over 30 Minutes Intravenous Every 24 hours 09/13/21 1434 09/13/21 1440  ? 09/13/21 1600  metroNIDAZOLE (FLAGYL) IVPB 500 mg       ? 500 mg ?100 mL/hr over 60 Minutes Intravenous Every 12 hours 09/13/21 1434    ? 09/11/21 1600  vancomycin (VANCOCIN) IVPB 1000 mg/200 mL premix  Status:  Discontinued       ? 1,000 mg ?200 mL/hr over 60 Minutes Intravenous Every 48 hours 09/10/21 0927 09/10/21 1238  ? 09/11/21 1000  azithromycin (ZITHROMAX) 500 mg in sodium chloride 0.9 % 250 mL IVPB       ? 500 mg ?250 mL/hr over 60 Minutes Intravenous Every 24 hours 09/10/21 1429 09/11/21 1018  ? 09/10/21 0127  vancomycin variable dose per unstable renal function (pharmacist dosing)  Status:  Discontinued       ?  Does not apply See admin instructions 09/10/21 0127 09/10/21 0935  ? 09/09/21 1700  vancomycin (VANCOCIN) IVPB 1000 mg/200 mL premix       ? 1,000 mg ?200 mL/hr over 60 Minutes  Intravenous  Once 09/09/21 1550 09/10/21 0701  ? 09/07/21 1400  ceFEPIme (MAXIPIME) 2 g in sodium chloride 0.9 % 100 mL IVPB  Status:  Discontinued       ? 2 g ?200 mL/hr over 30 Minutes Intravenous Every 24 hours 03/23

## 2021-09-15 NOTE — Progress Notes (Addendum)
Inpatient Diabetes Program Recommendations ? ?AACE/ADA: New Consensus Statement on Inpatient Glycemic Control (2015) ? ?Target Ranges:  Prepandial:   less than 140 mg/dL ?     Peak postprandial:   less than 180 mg/dL (1-2 hours) ?     Critically ill patients:  140 - 180 mg/dL  ? ? Latest Reference Range & Units 09/12/21 23:35 09/13/21 03:45 09/13/21 07:32 09/13/21 11:43 09/13/21 16:47 09/13/21 21:14  ?Glucose-Capillary 70 - 99 mg/dL 217 (H) ? ?3 units Novolog 157 (H) ? ?2 units Novolog 268 (H) ? ?5 units Novolog 283 (H) ? ?5 units Novolog ? 204 (H) ? ?3 units Novolog 246 (H) ? ?3 units Novolog  ?(H): Data is abnormally high ? Latest Reference Range & Units 10/05/2021 03:27 October 05, 2021 08:26  ?Glucose-Capillary 70 - 99 mg/dL 229 (H) ? ?3 units Novolog ? 272 (H)  ?(H): Data is abnormally high ? ? ? ?Diabetes history: DM2 ?  ?Outpatient Diabetes medications: None ?  ?Current Orders: Novolog 0-9 units Q4H ?  ?  ?  ? ?MD- Note Tube feeds at goal rate of 50cc/hr last night around 11:30pm ? ?If within goals of care: ? ?Please consider starting Novolog Tube feed Coverage: ?  ?Novolog 3 units Q4 hours (give with SSI dose) ?  ?Please HOLD if tube feeds HELD for any reason ? ? ? ?--Will follow patient during hospitalization-- ? ?Wyn Quaker RN, MSN, CDE ?Diabetes Coordinator ?Inpatient Glycemic Control Team ?Team Pager: [phone] (8a-5p) ? ?  ?  ?

## 2021-09-15 NOTE — ED Provider Notes (Signed)
[facility]  ?Department of Emergency Medicine ? ? ?Code Blue CONSULT NOTE ? ?Chief Complaint: Cardiac arrest/unresponsive  ? ?Level V Caveat: Unresponsive ? ?History of present illness: I was contacted by the hospital for a CODE BLUE cardiac arrest upstairs and presented to the patient's bedside.  ? ?Hospitalist Dr. Tawanna Solo  ? ?ROS: Unable to obtain, Level V caveat ? ?Scheduled Meds: ? Chlorhexidine Gluconate Cloth  6 each Topical Daily  ? docusate  100 mg Per Tube BID  ? dronabinol  2.5 mg Oral QAC lunch  ? feeding supplement  237 mL Oral BID BM  ? free water  80 mL Per Tube Q6H  ? heparin  5,000 Units Subcutaneous Q8H  ? insulin aspart  0-9 Units Subcutaneous Q4H  ? insulin aspart  3 Units Subcutaneous Q4H  ? multivitamin  15 mL Per Tube Daily  ? polyethylene glycol  17 g Oral Daily  ? ?Continuous Infusions: ? sodium chloride    ? cefTRIAXone (ROCEPHIN)  IV 1 g (09/13/21 1736)  ? feeding supplement (OSMOLITE 1.2 CAL) 1,000 mL (09/13/21 2333)  ? metronidazole 500 mg (October 05, 2021 0334)  ? ?PRN Meds:.acetaminophen, ipratropium-albuterol, labetalol, lip balm, morphine injection, oxyCODONE-acetaminophen, phenol, simethicone ?Past Medical History:  ?Diagnosis Date  ? Anemia   ? Anginal pain ([place])   ? Arthritis   ? Breast cancer ([place]) 07/19/2019  ? CKD (chronic kidney disease), stage III ([place])   ? Coronary artery disease   ? a. 2002 CABG x 3 (LIMA-LAD, SVG-D1, SVG-RCA); b. s/p multiple PCI's.  ? Diabetes mellitus without complication ([place])   ? Diastolic dysfunction   ? a. 12/2017 Echo: EF 55-60%, no rwma, Gr1 DD. Triv AI. Mild-mod MR/TR. PASP 26mHg.  ? Edema   ? Failure to thrive in adult   ? Fecal impaction ([place] 08/23/2021  ? GERD (gastroesophageal reflux disease)   ? Hb-SS disease without crisis ([place] 10/23/2017  ? History of breast cancer   ? HOH (hard of hearing)   ? Hyperlipidemia   ? Hypertension   ? Myocardial infarction ([facility]   ? PAD (peripheral artery disease) ([place]   ? a. s/p L AKA; b. 12/2018 PTA R  peroneal & PTA/Stenting R SFA.  ? Palpitations   ? ?Past Surgical History:  ?Procedure Laterality Date  ? ABDOMINAL HYSTERECTOMY    ? ABOVE KNEE LEG AMPUTATION Left 2013  ? ANTERIOR VITRECTOMY Left 11/16/2015  ? Procedure: ANTERIOR VITRECTOMY;  Surgeon: BEulogio Bear MD;  Location: [facility] ORS;  Service: Ophthalmology;  Laterality: Left;  ? BLADDER SURGERY    ? CATARACT EXTRACTION W/PHACO Left 11/16/2015  ? Procedure: CATARACT EXTRACTION PHACO AND INTRAOCULAR LENS PLACEMENT (IOC);  Surgeon: BEulogio Bear MD;  Location: [facility] ORS;  Service: Ophthalmology;  Laterality: Left;  Lot # [id number] ?[place] 01:22.8 ?AP%:12.6 ?CDE: 10.46  ? CATARACT EXTRACTION W/PHACO Right 08/22/2016  ? Procedure: CATARACT EXTRACTION PHACO AND INTRAOCULAR LENS PLACEMENT (IOC);  Surgeon: BEulogio Bear MD;  Location: [facility] ORS;  Service: Ophthalmology;  Laterality: Right;  Lot # [id number] ?[place] 00:52.1 ?AP%:8.7 ?CDE: 4.99 ?  ? CORONARY ANGIOPLASTY    ? STENT  ? CORONARY ARTERY BYPASS GRAFT    ? LOWER EXTREMITY ANGIOGRAPHY Right 01/14/2019  ? Procedure: LOWER EXTREMITY ANGIOGRAPHY;  Surgeon: DAlgernon Huxley MD;  [facility];  Service: Cardiovascular;  Laterality: Right;  ? MASTECTOMY    ? TUMOR REMOVAL    ? ABDOMINAL  ? ?Social History  ? ?Socioeconomic History  ? Marital status:  Divorced  ?  Spouse name: Not on file  ? Number of children: 7  ? Years of education: Not on file  ? Highest education level: Not on file  ?Occupational History  ? Occupation: retired  ?  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 Smoking status: Never  ? Smokeless tobacco: Never  ?Vaping Use  ? Vaping Use: Never used  ?Substance and Sexual Activity  ? Alcohol use: No  ? Drug use: No  ? Sexual activity: Not Currently  ?Other Topics Concern  ? Not on file  ?Social History Narrative  ? Not on file  ? ?Social Determinants of Health  ? ?Financial Resource Strain: Low Risk   ? Difficulty of Paying Living Expenses: Not hard at all  ?Food Insecurity: No Food  Insecurity  ? Worried About Charity fundraiser in the Last Year: Never true  ? Ran Out of Food in the Last Year: Never true  ?Transportation Needs: No Transportation Needs  ? Lack of Transportation (Medical): No  ? Lack of Transportation (Non-Medical): No  ?Physical Activity: Insufficiently Active  ? Days of Exercise per Week: 7 days  ? Minutes of Exercise per Session: 10 min  ?Stress: No Stress Concern Present  ? Feeling of Stress : Not at all  ?Social Connections: Moderately Isolated  ? Frequency of Communication with Friends and Family: More than three times a week  ? Frequency of Social Gatherings with Friends and Family: More than three times a week  ? Attends Religious Services: More than 4 times per year  ? Active Member of Clubs or Organizations: No  ? Attends Archivist Meetings: Never  ? Marital Status: Divorced  ?Intimate Partner Violence: Not At Risk  ? Fear of Current or Ex-Partner: No  ? Emotionally Abused: No  ? Physically Abused: No  ? Sexually Abused: No  ? ?Allergies  ?Allergen Reactions  ? Amoxicillin Hives and Nausea Only  ? Gabapentin Other (See Comments)  ?  AMS ?Confusion  ? Mirtazapine Itching  ?  Dry mouth with ODT ?Denies SOB with medication  ? Other   ?  UNKNOWN PAIN MEDICATION - unknown reaction  ? ? ?Last set of Vital Signs (not current) ?Vitals:  ? Sep 23, 2021 0825 September 23, 2021 1150  ?BP: (!) 147/72 (!) 117/50  ?Pulse: (!) 117 (!) 120  ?Resp: 20 16  ?Temp: 97.9 ?F (36.6 ?C) 98.2 ?F (36.8 ?C)  ?SpO2: 96% 94%  ? ?  ? ?Physical Exam ? ?Gen: unresponsive -CPR in progress.  Requested nursing begin bag-valve-mask ventilation rate of 1 breath every 6 to 8 seconds was initiated ?Cardiovascular: pulseless  ?Resp: apneic. Breath sounds equal bilaterally with bagging  ?Abd: nondistended  ?Neuro: GCS 3, unresponsive to pain  ?HEENT: No blood in posterior pharynx, gag reflex absent.  Emesis noted around patient's mouth and on bedding ?Neck: No crepitus  ?Musculoskeletal: No deformity  ?Skin:  Cool appears cyanotic nailbeds ? ? ?Patient intubated by respiratory therapy, Iona Beard.  Affirmed placement with lung sounds, direct visualization, and color change capnography on multiple breaths ? ?CRITICAL CARE ?Performed by: Delman Kitten ?Total critical care time: 25 ?Critical care time was exclusive of separately billable procedures and treating other patients. ?Critical care was necessary to treat or prevent imminent or life-threatening deterioration. ?Critical care was time spent personally by me on the following activities: development of treatment plan with patient and/or surrogate as well as nursing, discussions with consultants, evaluation of patient's response to treatment, examination of patient,  obtaining history from patient or surrogate, ordering and performing treatments and interventions, ordering and review of laboratory studies, ordering and review of radiographic studies, pulse oximetry and re-evaluation of patient's condition. ? ?Cardiopulmonary Resuscitation (CPR) Procedure Note ? ?Directed/Performed by: Delman Kitten ?I personally directed ancillary staff and/or performed CPR in an effort to regain return of spontaneous circulation and to maintain cardiac, neuro and systemic perfusion.  ? ? ?Medical Decision making  ?CPR in progress.  Apparently became severely hypotensive prior.  Was originally DNR per the hospitalist, but then CODE STATUS was just recently changed to full code.  Hospitalist advised he would like to be able to speak to the patient's medical decision maker who identifies as a daughter as he believes that resuscitation would likely be futile ? ?Assessment and Plan  ?We continued resuscitation.  Patient with PEA not shockable rhythm.  Multiple doses of epinephrine, intubation and CPR.  After several rounds of CPR patient's daughter entered the room with hospitalist, and advised she wishes for [place] to discontinuation of resuscitation ? ?Hospitalist Dr. Tawanna Solo, advises patient now DNR.   He will follow-up with regard to ongoing status, possible need for end-of-life/pronouncement of death.  At the time of end of my involvement in the resuscitation the patient is noted to have what appears to b

## 2021-09-15 DEATH — deceased

## 2021-09-27 ENCOUNTER — Telehealth: Payer: Medicare Other

## 2021-10-01 LAB — MISC LABCORP TEST (SEND OUT): Labcorp test code: 520085

## 2021-10-15 ENCOUNTER — Ambulatory Visit: Payer: Medicare Other | Admitting: Medical

## 2021-10-23 ENCOUNTER — Other Ambulatory Visit: Payer: Self-pay | Admitting: Oncology

## 2021-10-25 ENCOUNTER — Telehealth: Payer: Self-pay | Admitting: Family

## 2021-10-25 NOTE — Telephone Encounter (Signed)
Spoke to [employer] at [employer] an informed her that we had not received a FACE TO FACE addendum form and she stated that she was about to send another one over to me now! ?

## 2021-10-25 NOTE — Telephone Encounter (Signed)
Crystal From [employer] called in stating that there office faxed over some orders.... Crystal stated that the orders was for Face to Face addendum orders... Crystal I requesting a callback ?

## 2021-11-06 ENCOUNTER — Ambulatory Visit (INDEPENDENT_AMBULATORY_CARE_PROVIDER_SITE_OTHER): Payer: Medicare Other | Admitting: Nurse Practitioner

## 2021-11-06 ENCOUNTER — Encounter (INDEPENDENT_AMBULATORY_CARE_PROVIDER_SITE_OTHER): Payer: Medicare Other

## 2021-12-04 ENCOUNTER — Ambulatory Visit: Payer: Medicare Other | Admitting: Physician Assistant
